# Patient Record
Sex: Female | Born: 1935
Health system: Southern US, Community
[De-identification: ages and names within clinical notes are randomized; demographics above are authoritative.]

## PROBLEM LIST (undated history)

## (undated) DIAGNOSIS — I10 Essential (primary) hypertension: Secondary | ICD-10-CM

## (undated) DIAGNOSIS — K2100 Gastro-esophageal reflux disease with esophagitis, without bleeding: Secondary | ICD-10-CM

## (undated) DIAGNOSIS — G459 Transient cerebral ischemic attack, unspecified: Secondary | ICD-10-CM

## (undated) DIAGNOSIS — H3530 Unspecified macular degeneration: Secondary | ICD-10-CM

## (undated) DIAGNOSIS — E785 Hyperlipidemia, unspecified: Secondary | ICD-10-CM

## (undated) DIAGNOSIS — M5020 Other cervical disc displacement, unspecified cervical region: Secondary | ICD-10-CM

## (undated) DIAGNOSIS — M810 Age-related osteoporosis without current pathological fracture: Secondary | ICD-10-CM

## (undated) DIAGNOSIS — E039 Hypothyroidism, unspecified: Secondary | ICD-10-CM

## (undated) DIAGNOSIS — E559 Vitamin D deficiency, unspecified: Secondary | ICD-10-CM

## (undated) DIAGNOSIS — K5730 Diverticulosis of large intestine without perforation or abscess without bleeding: Secondary | ICD-10-CM

## (undated) DIAGNOSIS — M4120 Other idiopathic scoliosis, site unspecified: Secondary | ICD-10-CM

## (undated) DIAGNOSIS — M542 Cervicalgia: Secondary | ICD-10-CM

## (undated) DIAGNOSIS — H409 Unspecified glaucoma: Secondary | ICD-10-CM

## (undated) DIAGNOSIS — M545 Low back pain, unspecified: Secondary | ICD-10-CM

## (undated) DIAGNOSIS — M1990 Unspecified osteoarthritis, unspecified site: Secondary | ICD-10-CM

## (undated) DIAGNOSIS — L989 Disorder of the skin and subcutaneous tissue, unspecified: Secondary | ICD-10-CM

## (undated) DIAGNOSIS — E079 Disorder of thyroid, unspecified: Secondary | ICD-10-CM

## (undated) DIAGNOSIS — K4090 Unilateral inguinal hernia, without obstruction or gangrene, not specified as recurrent: Secondary | ICD-10-CM

## (undated) DIAGNOSIS — R079 Chest pain, unspecified: Secondary | ICD-10-CM

## (undated) DIAGNOSIS — N393 Stress incontinence (female) (male): Secondary | ICD-10-CM

## (undated) DIAGNOSIS — K648 Other hemorrhoids: Secondary | ICD-10-CM

## (undated) DIAGNOSIS — R002 Palpitations: Secondary | ICD-10-CM

## (undated) DIAGNOSIS — M7120 Synovial cyst of popliteal space [Baker], unspecified knee: Secondary | ICD-10-CM

## (undated) DIAGNOSIS — M25549 Pain in joints of unspecified hand: Secondary | ICD-10-CM

## (undated) DIAGNOSIS — K219 Gastro-esophageal reflux disease without esophagitis: Secondary | ICD-10-CM

## (undated) HISTORY — DX: Unilateral inguinal hernia, without obstruction or gangrene, not specified as recurrent: K40.90

## (undated) HISTORY — DX: Vitamin D deficiency, unspecified: E55.9

## (undated) HISTORY — DX: Low back pain: M54.5

## (undated) HISTORY — DX: Unspecified osteoarthritis, unspecified site: M19.90

## (undated) HISTORY — DX: Diverticulosis of large intestine without perforation or abscess without bleeding: K57.30

## (undated) HISTORY — DX: Low back pain, unspecified: M54.50

## (undated) HISTORY — DX: Gastro-esophageal reflux disease with esophagitis, without bleeding: K21.00

## (undated) HISTORY — DX: Palpitations: R00.2

## (undated) HISTORY — DX: Stress incontinence (female) (male): N39.3

## (undated) HISTORY — DX: Synovial cyst of popliteal space (Baker), unspecified knee: M71.20

## (undated) HISTORY — DX: Cervicalgia: M54.2

## (undated) HISTORY — DX: Age-related osteoporosis without current pathological fracture: M81.0

## (undated) HISTORY — DX: Disorder of the skin and subcutaneous tissue, unspecified: L98.9

## (undated) HISTORY — DX: Other idiopathic scoliosis, site unspecified: M41.20

## (undated) HISTORY — DX: Other hemorrhoids: K64.8

## (undated) HISTORY — DX: Disorder of thyroid, unspecified: E07.9

## (undated) HISTORY — DX: Hyperlipidemia, unspecified: E78.5

## (undated) HISTORY — PX: NM MYOVIEW LTD: HXRAD82

## (undated) HISTORY — DX: Unspecified glaucoma: H40.9

## (undated) HISTORY — PX: OTHER SURGICAL HISTORY: SHX169

## (undated) HISTORY — DX: Unspecified macular degeneration: H35.30

## (undated) HISTORY — DX: Hypothyroidism, unspecified: E03.9

## (undated) HISTORY — PX: DOPPLER ECHOCARDIOGRAPHY: SHX263

## (undated) HISTORY — DX: Other cervical disc displacement, unspecified cervical region: M50.20

## (undated) HISTORY — PX: INGUINAL HERNIA REPAIR: SUR1180

## (undated) HISTORY — DX: Essential (primary) hypertension: I10

## (undated) HISTORY — DX: Pain in joints of unspecified hand: M25.549

## (undated) HISTORY — DX: Gastro-esophageal reflux disease with esophagitis: K21.0

---
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
## 1973-05-15 HISTORY — PX: ABDOMINAL HYSTERECTOMY: SHX81

## 1986-05-15 HISTORY — PX: APPENDECTOMY: SHX54

## 2000-04-09 ENCOUNTER — Other Ambulatory Visit: Admission: RE | Admit: 2000-04-09 | Discharge: 2000-04-09 | Payer: Self-pay | Admitting: Gynecology

## 2000-07-02 ENCOUNTER — Encounter: Payer: Self-pay | Admitting: Internal Medicine

## 2000-07-02 ENCOUNTER — Ambulatory Visit (HOSPITAL_COMMUNITY): Admission: RE | Admit: 2000-07-02 | Discharge: 2000-07-02 | Payer: Self-pay | Admitting: Internal Medicine

## 2000-07-03 ENCOUNTER — Encounter: Payer: Self-pay | Admitting: Internal Medicine

## 2000-07-09 ENCOUNTER — Encounter: Admission: RE | Admit: 2000-07-09 | Discharge: 2000-07-09 | Payer: Self-pay | Admitting: Internal Medicine

## 2000-07-09 ENCOUNTER — Encounter: Payer: Self-pay | Admitting: Internal Medicine

## 2001-09-12 ENCOUNTER — Ambulatory Visit (HOSPITAL_COMMUNITY): Admission: RE | Admit: 2001-09-12 | Discharge: 2001-09-12 | Payer: Self-pay | Admitting: Internal Medicine

## 2003-06-10 ENCOUNTER — Encounter: Admission: RE | Admit: 2003-06-10 | Discharge: 2003-06-10 | Payer: Self-pay | Admitting: Gastroenterology

## 2004-12-27 ENCOUNTER — Ambulatory Visit (HOSPITAL_COMMUNITY): Admission: RE | Admit: 2004-12-27 | Discharge: 2004-12-27 | Payer: Self-pay | Admitting: Gastroenterology

## 2006-09-24 LAB — HM COLONOSCOPY

## 2007-05-16 HISTORY — PX: EYE SURGERY: SHX253

## 2008-08-10 ENCOUNTER — Encounter: Admission: RE | Admit: 2008-08-10 | Discharge: 2008-08-10 | Payer: Self-pay | Admitting: Internal Medicine

## 2010-09-30 NOTE — Procedures (Signed)
[address]. [facility]  Patient:    [name], [name] Visit Number: [id number] MRN: [id number]          Service Type: END Location: ENDO Attending Physician:  Orland Mustard Dictated by:   Llana Aliment. Randa Evens, M.D. Proc. Date: 09/12/01 Admit Date:  09/12/2001   CC:         Lenon Curt. Cassell Clement, M.D.   Procedure Report  DATE OF BIRTH:  08/22/1928.  PROCEDURE:  Colonoscopy.  MEDICATIONS:  Fentanyl 75 mcg, Versed 5 mg IV.  SCOPE:  Olympus pediatric video colonoscope.  INDICATION:  Strong family history of colon cancer.  DESCRIPTION OF PROCEDURE:  The procedure had been explained to the patient and consent obtained.  With the patient in the left lateral decubitus position, the Olympus pediatric video scope was inserted and advanced under direct visualization.  The prep was excellent.  She had extensive diverticular disease.  After we were able to pass the sigmoid, we were able to advance rapidly to the cecum.  The terminal ileum was entered for a short distance and was normal.  The scope was withdrawn and the cecum, ascending colon, hepatic flexure, transverse colon, splenic flexure, descending, and sigmoid colon were seen well upon removal.  No polyps or other lesions were seen.  The scope was then withdrawn.  The patient tolerated the procedure well.  ASSESSMENT: 1. Marked diverticular disease. 2. No polyps in this high-risk individual.  PLAN:  Will recommend repeating in five years due to her family history. Dictated by:   Llana Aliment. Randa Evens, M.D. Attending Physician:  Orland Mustard DD:  09/12/01 TD:  09/13/01 Job: [id number]

## 2010-09-30 NOTE — Op Note (Signed)
[name], [name]                ACCOUNT NO.:  [id number]   MEDICAL RECORD NO.:  [id number]          PATIENT TYPE:  AMB   LOCATION:  ENDO                         FACILITY:  [facility]   PHYSICIAN:  James L. Malon Kindle., M.D.DATE OF BIRTH:  Dec 31, 1928   DATE OF PROCEDURE:  12/27/2004  DATE OF DISCHARGE:                                 OPERATIVE REPORT   PROCEDURE:  Esophagogastroduodenoscopy.   MEDICATIONS:  1.  Fentanyl 40 mcg.  2.  Versed 4 mg IV.   INDICATION:  Dyspepsia.   DESCRIPTION OF PROCEDURE:  Procedure explained and the patient consent  obtained.  With the patient in the left lateral decubitus position, the  Olympus scope was inserted and advanced.  The stomach was __________passed.  The duodenum including the bulb and second portion were seen well.  The  scope withdrawn back in the stomach.  The antrum and body were normal, the  fundus and cardia were seen well in the retroflex view and were normal.  The  GE junction was widely patent.  Distal esophagus was slightly red, there was  no Barrett's esophagus.  The scope was withdrawn and the patient tolerated  the procedure well.   ASSESSMENT:  Dyspepsia probably due to esophageal reflux (81191).   PLAN:  1.  Will continue to treat for reflux and give her a reflux instruction      sheet, continue the Protonix.  2.  See back in the office in 2-3 months.           ______________________________  Llana Aliment. Malon Kindle., M.D.     Waldron Session  D:  12/27/2004  T:  12/27/2004  Job:  [id number]   cc:   Lenon Curt. Chilton Si, M.D.  [address]  [place]  [place] [address]  Fax: [phone]

## 2010-12-02 LAB — HM DEXA SCAN

## 2011-06-01 DIAGNOSIS — M1710 Unilateral primary osteoarthritis, unspecified knee: Secondary | ICD-10-CM | POA: Diagnosis not present

## 2011-06-19 DIAGNOSIS — M1710 Unilateral primary osteoarthritis, unspecified knee: Secondary | ICD-10-CM | POA: Diagnosis not present

## 2011-06-19 DIAGNOSIS — I10 Essential (primary) hypertension: Secondary | ICD-10-CM | POA: Diagnosis not present

## 2011-06-19 DIAGNOSIS — M810 Age-related osteoporosis without current pathological fracture: Secondary | ICD-10-CM | POA: Diagnosis not present

## 2011-06-19 DIAGNOSIS — E039 Hypothyroidism, unspecified: Secondary | ICD-10-CM | POA: Diagnosis not present

## 2011-06-19 DIAGNOSIS — M2040 Other hammer toe(s) (acquired), unspecified foot: Secondary | ICD-10-CM | POA: Diagnosis not present

## 2011-06-19 DIAGNOSIS — E559 Vitamin D deficiency, unspecified: Secondary | ICD-10-CM | POA: Diagnosis not present

## 2011-06-19 DIAGNOSIS — M25569 Pain in unspecified knee: Secondary | ICD-10-CM | POA: Diagnosis not present

## 2011-06-21 DIAGNOSIS — E559 Vitamin D deficiency, unspecified: Secondary | ICD-10-CM | POA: Diagnosis not present

## 2011-06-21 DIAGNOSIS — L84 Corns and callosities: Secondary | ICD-10-CM | POA: Diagnosis not present

## 2011-06-21 DIAGNOSIS — E039 Hypothyroidism, unspecified: Secondary | ICD-10-CM | POA: Diagnosis not present

## 2011-06-21 DIAGNOSIS — I10 Essential (primary) hypertension: Secondary | ICD-10-CM | POA: Diagnosis not present

## 2011-07-19 DIAGNOSIS — M2040 Other hammer toe(s) (acquired), unspecified foot: Secondary | ICD-10-CM | POA: Diagnosis not present

## 2011-08-15 DIAGNOSIS — H409 Unspecified glaucoma: Secondary | ICD-10-CM | POA: Diagnosis not present

## 2011-08-15 DIAGNOSIS — H26499 Other secondary cataract, unspecified eye: Secondary | ICD-10-CM | POA: Diagnosis not present

## 2011-08-15 DIAGNOSIS — H401290 Low-tension glaucoma, unspecified eye, stage unspecified: Secondary | ICD-10-CM | POA: Diagnosis not present

## 2011-09-15 ENCOUNTER — Encounter: Payer: Self-pay | Admitting: Family Medicine

## 2011-09-15 ENCOUNTER — Ambulatory Visit (INDEPENDENT_AMBULATORY_CARE_PROVIDER_SITE_OTHER): Payer: Medicare Other | Admitting: Family Medicine

## 2011-09-15 VITALS — BP 152/79 | HR 79 | Temp 97.9°F | Resp 16 | Ht 60.4 in | Wt 123.4 lb

## 2011-09-15 DIAGNOSIS — J019 Acute sinusitis, unspecified: Secondary | ICD-10-CM

## 2011-09-15 DIAGNOSIS — R509 Fever, unspecified: Secondary | ICD-10-CM

## 2011-09-15 MED ORDER — LEVOFLOXACIN 500 MG PO TABS: 500.0000 mg | ORAL_TABLET | Freq: Every day | ORAL | Status: AC

## 2011-09-15 NOTE — Progress Notes (Signed)
This is an 78 year old woman who is a patient of Dr. Murray Hodgkins and comes in with sinus congestion, frontal headache, scratchy throat, and occasional cough. She has an associated low-grade fever of 99-1/2 the last 36 hours.  She denies ear pain, nausea, vomiting, chest pain, shortness of breath, problems with a limitation, or rash  Objective: Elderly woman in no acute distress with normal gait  HEENT: Mild erythema throat, marked mucopurulent discharge of both nasal passages, normal TMs  Neck: Supple, no adenopathy, no thyromegaly  Chest: Clear  Heart: Regular no murmur or gallop  Skin: No rashes  Assessment: Acute sinusitis with associated symptoms of low-grade fever and scratchy throat with occasional cough  Plan: Levaquin 500 daily x7, call if problems persist or worsen

## 2011-09-15 NOTE — Progress Notes (Signed)
  Subjective:    Patient ID: [name], female    DOB: [birth year], 78 y.o.   MRN: [id number]  Sore Throat  Associated symptoms include congestion, coughing and headaches.  Cough The current episode started in the past 7 days. Associated symptoms include headaches.  Headache  This is a new problem. The current episode started in the past 7 days. Associated symptoms include coughing.   Several days of progressive congestion with frontal headache, scratchy throat, and now fever for 2 days. No significant cough.   Review of Systems  Constitutional: Negative.   HENT: Positive for congestion.   Respiratory: Positive for cough.   Neurological: Positive for headaches.  Nasal cavity and oropharynx erythema.     Objective:   Physical Exam        Assessment & Plan:  Sinusitis   Levaquin 500mg  1 qd #7

## 2011-09-19 ENCOUNTER — Telehealth: Payer: Self-pay

## 2011-09-19 DIAGNOSIS — J209 Acute bronchitis, unspecified: Secondary | ICD-10-CM | POA: Diagnosis not present

## 2011-09-19 NOTE — Telephone Encounter (Signed)
Faxed records over to Dr Thomasene Lot Office per patient's request.  The records were faxed thru Epic.

## 2011-09-19 NOTE — Telephone Encounter (Signed)
.  umfc The patient called to request that her notes from her 09/15/11 office visit with Dr. Milus Glazier be faxed to Dr. Thomasene Lot office at [phone] for her appointment at 1pm today.

## 2011-11-15 DIAGNOSIS — R070 Pain in throat: Secondary | ICD-10-CM | POA: Diagnosis not present

## 2011-11-22 DIAGNOSIS — Z01810 Encounter for preprocedural cardiovascular examination: Secondary | ICD-10-CM | POA: Diagnosis not present

## 2011-11-22 DIAGNOSIS — I10 Essential (primary) hypertension: Secondary | ICD-10-CM | POA: Diagnosis not present

## 2011-11-22 DIAGNOSIS — E785 Hyperlipidemia, unspecified: Secondary | ICD-10-CM | POA: Diagnosis not present

## 2011-11-27 DIAGNOSIS — M2040 Other hammer toe(s) (acquired), unspecified foot: Secondary | ICD-10-CM | POA: Diagnosis not present

## 2011-11-30 DIAGNOSIS — M25519 Pain in unspecified shoulder: Secondary | ICD-10-CM | POA: Diagnosis not present

## 2011-11-30 DIAGNOSIS — R070 Pain in throat: Secondary | ICD-10-CM | POA: Diagnosis not present

## 2011-12-14 HISTORY — PX: FOOT SURGERY: SHX648

## 2011-12-25 DIAGNOSIS — I10 Essential (primary) hypertension: Secondary | ICD-10-CM | POA: Diagnosis not present

## 2011-12-25 DIAGNOSIS — E0590 Thyrotoxicosis, unspecified without thyrotoxic crisis or storm: Secondary | ICD-10-CM | POA: Diagnosis not present

## 2011-12-25 DIAGNOSIS — E785 Hyperlipidemia, unspecified: Secondary | ICD-10-CM | POA: Diagnosis not present

## 2011-12-27 DIAGNOSIS — E785 Hyperlipidemia, unspecified: Secondary | ICD-10-CM | POA: Diagnosis not present

## 2011-12-27 DIAGNOSIS — M810 Age-related osteoporosis without current pathological fracture: Secondary | ICD-10-CM | POA: Diagnosis not present

## 2011-12-27 DIAGNOSIS — M2040 Other hammer toe(s) (acquired), unspecified foot: Secondary | ICD-10-CM | POA: Diagnosis not present

## 2011-12-27 DIAGNOSIS — H6120 Impacted cerumen, unspecified ear: Secondary | ICD-10-CM | POA: Diagnosis not present

## 2012-01-06 ENCOUNTER — Observation Stay (HOSPITAL_COMMUNITY)
Admission: EM | Admit: 2012-01-06 | Discharge: 2012-01-07 | Disposition: A | Discharge status: Home / Self Care | Payer: Medicare Other | Attending: Emergency Medicine | Admitting: Emergency Medicine

## 2012-01-06 DIAGNOSIS — I10 Essential (primary) hypertension: Secondary | ICD-10-CM | POA: Insufficient documentation

## 2012-01-06 DIAGNOSIS — J449 Chronic obstructive pulmonary disease, unspecified: Secondary | ICD-10-CM | POA: Insufficient documentation

## 2012-01-06 DIAGNOSIS — R079 Chest pain, unspecified: Principal | ICD-10-CM | POA: Diagnosis present

## 2012-01-06 DIAGNOSIS — J4489 Other specified chronic obstructive pulmonary disease: Secondary | ICD-10-CM | POA: Insufficient documentation

## 2012-01-06 HISTORY — DX: Chest pain, unspecified: R07.9

## 2012-01-06 LAB — BASIC METABOLIC PANEL
BUN: 21 mg/dL (ref 6–23)
Chloride: 104 mEq/L (ref 96–112)
GFR calc non Af Amer: 63 mL/min — ABNORMAL LOW (ref >90)
Glucose, Bld: 99 mg/dL (ref 70–99)
Potassium: 3.7 mEq/L (ref 3.5–5.1)
Sodium: 139 mEq/L (ref 135–145)

## 2012-01-06 LAB — CBC WITH DIFFERENTIAL/PLATELET
Hemoglobin: 11.8 g/dL — ABNORMAL LOW (ref 12.0–15.0)
Lymphocytes Relative: 40 % (ref 12–46)
Lymphs Abs: 2.9 10*3/uL (ref 0.7–4.0)
Monocytes Relative: 6 % (ref 3–12)
Neutro Abs: 3.7 10*3/uL (ref 1.7–7.7)
Neutrophils Relative %: 52 % (ref 43–77)
Platelets: 187 10*3/uL (ref 150–400)
RBC: 3.88 MIL/uL (ref 3.87–5.11)
WBC: 7.3 10*3/uL (ref 4.0–10.5)

## 2012-01-06 NOTE — ED Notes (Signed)
Pt arrived via [facility] from home c/o intermittent at sharp stabbing substernal CP last for approximately a second every 10 minutes. 4 episodes experienced en route with EMS. 20 ga Lt forearm placed by EMS. 324 ASA administered prior to arrival. Pt scheduled to have surgery on left foot on Tuesday. Pt denies n/v, SOB, or back pain

## 2012-01-06 NOTE — ED Provider Notes (Signed)
History     CSN: [id number]  Arrival date & time 01/06/12  2145   First MD Initiated Contact with Patient 01/06/12 2216      Chief Complaint  Patient presents with  . Chest Pain    (Consider location/radiation/quality/duration/timing/severity/associated sxs/prior treatment) Patient is a 78 y.o. female presenting with chest pain. The history is provided by the patient.  Chest Pain The chest pain began 3 - 5 hours ago. Duration of episode(s) is 10 minutes. Chest pain occurs intermittently (3 times today). The chest pain is resolved. The pain is currently at 0/10. The quality of the pain is described as sharp (substernal). The pain does not radiate. Pertinent negatives for primary symptoms include no fever, no fatigue, no syncope, no shortness of breath, no cough, no wheezing, no palpitations, no nausea, no vomiting and no dizziness.  Pertinent negatives for associated symptoms include no diaphoresis, no lower extremity edema, no orthopnea and no paroxysmal nocturnal dyspnea. She tried nothing for the symptoms. Risk factors include being elderly.  Her past medical history is significant for hypertension.  Pertinent negatives for past medical history include no CAD, no COPD, no CHF and no diabetes.     No past medical history on file.  No past surgical history on file.  No family history on file.  History  Substance Use Topics  . Smoking status: Never Smoker   . Smokeless tobacco: Never Used  . Alcohol Use: Not on file    OB History    Grav Para Term Preterm Abortions TAB SAB Ect Mult Living                  Review of Systems  Constitutional: Negative for fever, chills, diaphoresis and fatigue.  HENT: Negative.   Eyes: Negative.   Respiratory: Negative for cough, chest tightness, shortness of breath and wheezing.   Cardiovascular: Positive for chest pain. Negative for palpitations, orthopnea, leg swelling and syncope.  Gastrointestinal: Negative.  Negative for nausea and  vomiting.  Genitourinary: Negative.   Musculoskeletal: Negative.   Skin: Negative.   Neurological: Negative.  Negative for dizziness.  All other systems reviewed and are negative.    Allergies  Latex; Erythromycin; Macrodantin; Penicillins; Septra; and Trimethoprim  Home Medications   Current Outpatient Rx  Name Route Sig Dispense Refill  . ASPIRIN 81 MG PO TBEC Oral Take 81 mg by mouth daily.     Marland Kitchen BIMATOPROST 0.01 % OP SOLN Both Eyes Place 1 drop into both eyes at bedtime.    Marland Kitchen CALCIUM CARBONATE ANTACID 500 MG PO CHEW Oral Chew 3 tablets by mouth 2 (two) times daily.    Marland Kitchen VITAMIN D3 2000 UNITS PO TABS Oral Take 2,000 Units/day by mouth 2 (two) times daily.    Marland Kitchen HYPROMELLOSE 0.3 % OP SOLN Ophthalmic Apply 1 drop to eye 3 (three) times daily.    Marland Kitchen METOPROLOL SUCCINATE ER 25 MG PO TB24 Oral Take 25 mg by mouth daily.    . ICAPS PO Oral Take 1 tablet by mouth 2 (two) times daily.    Marland Kitchen OMEPRAZOLE 20 MG PO CPDR Oral Take 20 mg by mouth daily.      BP 129/87  Pulse 61  Temp 98.2 F (36.8 C) (Oral)  Resp 14  SpO2 97%  Physical Exam  Nursing note and vitals reviewed. Constitutional: She is oriented to person, place, and time. She appears well-developed and well-nourished. No distress.  HENT:  Head: Normocephalic and atraumatic.  Eyes: Conjunctivae are normal.  Neck: Neck supple.  Cardiovascular: Normal rate, regular rhythm, normal heart sounds and intact distal pulses.  Exam reveals no friction rub.   No murmur heard. Pulmonary/Chest: Effort normal and breath sounds normal. She has no wheezes. She has no rales.  Abdominal: Soft. She exhibits no distension. There is no tenderness.  Musculoskeletal: Normal range of motion. She exhibits no edema and no tenderness.  Neurological: She is alert and oriented to person, place, and time.  Skin: Skin is warm and dry.    ED Course  Procedures (including critical care time)  Labs Reviewed  CBC WITH DIFFERENTIAL - Abnormal; Notable  for the following:    Hemoglobin 11.8 (*)     HCT 35.5 (*)     All other components within normal limits  BASIC METABOLIC PANEL - Abnormal; Notable for the following:    GFR calc non Af Amer 63 (*)     GFR calc Af Amer 74 (*)     All other components within normal limits  POCT I-STAT TROPONIN I   Dg Chest 2 View  01/07/2012  *RADIOLOGY REPORT*  Clinical Data: Chest pain  CHEST - 2 VIEW  Comparison: None.  Findings: Hyperinflation with interstitial coarsening.  Apical scarring bilaterally. 1.3 cm nodular density projecting over the left upper lobe.  No focal consolidation otherwise.  No pleural effusion or pneumothorax.  Cardiomediastinal contours are within normal limits.  Mild rightward curvature of the thoracolumbar spine.  Osteopenia.  No acute osseous finding.  IMPRESSION: COPD changes with hyperinflation and interstitial coarsening.  Apical scarring. 1.3 cm nodular density projecting over the left upper lobe may also reflect scarring though a nodule is not excluded. Recommend further evaluate with a nonemergent chest CT follow-up.  Otherwise, no focal consolidation.   Original Report Authenticated By: Waneta Martins, M.D.      1. Chest pain    MDM  78 yo female with PMHx of HTN who presents for 3 episodes of sharp substernal chest pain that occurred this evening prior to arrival.  Episodes consisted of sharp pains lasting a few seconds and occurring for 10-15 minutes at a time.  Pain free at time of exam.  No shortness of breath, diaphoresis, nausea, vomiting, orthopnea, PND.  No pleuritic component to the chest pain.  Description atypical, but given age and history of HTN, will get labs including cardiac enzymes and CXR.  CBC and BMP wnl.  Troponin negative.  CXR showed apical scaring and 1.3 cm nodular density over left upper lobe.  Will need follow-up CT as outpatient.  Will place in CDU for low-risk chest pain obs for serial enzymes and cardiac CT tomorrow  morning.        Cherre Robins, MD 01/07/12 [phone]

## 2012-01-07 ENCOUNTER — Emergency Department (HOSPITAL_COMMUNITY): Payer: Medicare Other

## 2012-01-07 ENCOUNTER — Encounter (HOSPITAL_COMMUNITY): Payer: Self-pay | Admitting: Cardiology

## 2012-01-07 ENCOUNTER — Observation Stay (HOSPITAL_COMMUNITY): Payer: Medicare Other

## 2012-01-07 DIAGNOSIS — R079 Chest pain, unspecified: Secondary | ICD-10-CM | POA: Diagnosis not present

## 2012-01-07 DIAGNOSIS — J449 Chronic obstructive pulmonary disease, unspecified: Secondary | ICD-10-CM | POA: Diagnosis not present

## 2012-01-07 DIAGNOSIS — J984 Other disorders of lung: Secondary | ICD-10-CM | POA: Diagnosis not present

## 2012-01-07 HISTORY — DX: Chest pain, unspecified: R07.9

## 2012-01-07 LAB — CARDIAC PANEL(CRET KIN+CKTOT+MB+TROPI)
CK, MB: 3.3 ng/mL (ref 0.3–4.0)
Relative Index: INVALID (ref 0.0–2.5)
Total CK: 72 U/L (ref 7–177)

## 2012-01-07 LAB — POCT I-STAT TROPONIN I
Troponin i, poc: 0 ng/mL (ref 0.00–0.08)
Troponin i, poc: 0 ng/mL (ref 0.00–0.08)

## 2012-01-07 LAB — MAGNESIUM: Magnesium: 1.9 mg/dL (ref 1.5–2.5)

## 2012-01-07 MED ORDER — TECHNETIUM TC 99M TETROFOSMIN IV KIT
10.0000 | PACK | Freq: Once | INTRAVENOUS | Status: AC | PRN
  Administered 2012-01-07: 10 via INTRAVENOUS

## 2012-01-07 MED ORDER — METOPROLOL TARTRATE 25 MG PO TABS
50.0000 mg | ORAL_TABLET | Freq: Once | ORAL | Status: AC
  Administered 2012-01-07: 50 mg via ORAL
  Filled 2012-01-07: qty 2

## 2012-01-07 MED ORDER — REGADENOSON 0.4 MG/5ML IV SOLN
0.4000 mg | Freq: Once | INTRAVENOUS | Status: AC
  Administered 2012-01-07: 0.4 mg via INTRAVENOUS

## 2012-01-07 MED ORDER — TECHNETIUM TC 99M TETROFOSMIN IV KIT
30.0000 | PACK | Freq: Once | INTRAVENOUS | Status: AC | PRN
  Administered 2012-01-07: 30 via INTRAVENOUS

## 2012-01-07 MED ORDER — REGADENOSON 0.4 MG/5ML IV SOLN
INTRAVENOUS | Status: AC
  Filled 2012-01-07: qty 5

## 2012-01-07 MED ORDER — METOPROLOL SUCCINATE ER 25 MG PO TB24
25.0000 mg | ORAL_TABLET | Freq: Every day | ORAL | Status: DC
  Filled 2012-01-07: qty 1

## 2012-01-07 MED ORDER — PANTOPRAZOLE SODIUM 40 MG PO TBEC: 40.0000 mg | DELAYED_RELEASE_TABLET | Freq: Every day | ORAL | Status: DC

## 2012-01-07 NOTE — ED Notes (Signed)
Pt. oob to the bathroom, gait steady, pt. Denies any pain or discomfort.   Daughter with pt.

## 2012-01-07 NOTE — ED Provider Notes (Signed)
2:43 PM BP 124/67  Pulse 59  Temp 98.1 F (36.7 C) (Oral)  Resp 14  Ht 5' 2.99" (1.6 m)  Wt 123 lb 4.8 oz (55.929 kg)  BMI 21.85 kg/m2  SpO2 96% Patient seen and evaluated. She is in the CDU awaiting a visit from her cardiologist Dr. Herbie Baltimore. She came in today with chief complaint of 3-5 days of intermittent chest pain. No chest pain today. EKG and troponins were negative. Abnormal findings included apical scarring of the lungs and a 1.3 cm nodular density in the left upper lobe. This will need followup TTS outpatient. Cardiology put in an order for an unclear medicine myocardial multi view. It was negative for any pharmacologic stress-induced ischemia. Showed a left ventricular ejection fraction of 85%.  The patient is resting comfortably  in her room. She has no complaints at this time.  CV: RRR, No M/R/G, Peripheral pulses intact. No peripheral edema. Lungs: CTAB Abd: Soft, Non tender, non distended  4:02 PM given report to Smitty Cords is assumed care of the patient.  Arthor Captain, PA-C 01/07/12 1603

## 2012-01-07 NOTE — ED Provider Notes (Signed)
History     CSN: [id number]  Arrival date & time 01/06/12  2145   First MD Initiated Contact with Patient 01/06/12 2216      Chief Complaint  Patient presents with  . Chest Pain    (Consider location/radiation/quality/duration/timing/severity/associated sxs/prior treatment) HPI  Past Medical History  Diagnosis Date  . Chest pain at rest 01/07/2012    History reviewed. No pertinent past surgical history.  History reviewed. No pertinent family history.  History  Substance Use Topics  . Smoking status: Never Smoker   . Smokeless tobacco: Never Used  . Alcohol Use: Not on file    OB History    Grav Para Term Preterm Abortions TAB SAB Ect Mult Living                  Review of Systems  Allergies  Latex; Erythromycin; Macrodantin; Penicillins; Septra; and Trimethoprim  Home Medications   Current Outpatient Rx  Name Route Sig Dispense Refill  . ASPIRIN 81 MG PO TBEC Oral Take 81 mg by mouth daily.     Marland Kitchen BIMATOPROST 0.01 % OP SOLN Both Eyes Place 1 drop into both eyes at bedtime.    Marland Kitchen CALCIUM CARBONATE ANTACID 500 MG PO CHEW Oral Chew 3 tablets by mouth 2 (two) times daily.    Marland Kitchen VITAMIN D3 2000 UNITS PO TABS Oral Take 2,000 Units/day by mouth 2 (two) times daily.    Marland Kitchen HYPROMELLOSE 0.3 % OP SOLN Ophthalmic Apply 1 drop to eye 3 (three) times daily.    Marland Kitchen METOPROLOL SUCCINATE ER 25 MG PO TB24 Oral Take 25 mg by mouth daily.    . ICAPS PO Oral Take 1 tablet by mouth 2 (two) times daily.    Marland Kitchen OMEPRAZOLE 20 MG PO CPDR Oral Take 20 mg by mouth daily.      BP 124/67  Pulse 59  Temp 98.1 F (36.7 C) (Oral)  Resp 14  Ht 5' 2.99" (1.6 m)  Wt 123 lb 4.8 oz (55.929 kg)  BMI 21.85 kg/m2  SpO2 96%  Physical Exam  ED Course  Procedures (including critical care time)  Labs Reviewed  CBC WITH DIFFERENTIAL - Abnormal; Notable for the following:    Hemoglobin 11.8 (*)     HCT 35.5 (*)     All other components within normal limits  BASIC METABOLIC PANEL - Abnormal;  Notable for the following:    GFR calc non Af Amer 63 (*)     GFR calc Af Amer 74 (*)     All other components within normal limits  POCT I-STAT TROPONIN I  POCT I-STAT TROPONIN I  POCT I-STAT TROPONIN I  CARDIAC PANEL(CRET KIN+CKTOT+MB+TROPI)  MAGNESIUM  TSH   Dg Chest 2 View  01/07/2012  *RADIOLOGY REPORT*  Clinical Data: Chest pain  CHEST - 2 VIEW  Comparison: None.  Findings: Hyperinflation with interstitial coarsening.  Apical scarring bilaterally. 1.3 cm nodular density projecting over the left upper lobe.  No focal consolidation otherwise.  No pleural effusion or pneumothorax.  Cardiomediastinal contours are within normal limits.  Mild rightward curvature of the thoracolumbar spine.  Osteopenia.  No acute osseous finding.  IMPRESSION: COPD changes with hyperinflation and interstitial coarsening.  Apical scarring. 1.3 cm nodular density projecting over the left upper lobe may also reflect scarring though a nodule is not excluded. Recommend further evaluate with a nonemergent chest CT follow-up.  Otherwise, no focal consolidation.   Original Report Authenticated By: Waneta Martins, M.D.  Nm Myocar Multi W/spect W/wall Motion / Ef  01/07/2012  *RADIOLOGY REPORT*  Clinical data: Chest pain, preop clearance  NUCLEAR MEDICINE MYOCARDIAL PERFUSION IMAGING NUCLEAR MEDICINE LEFT VENTRICULAR WALL MOTION ANALYSIS NUCLEAR MEDICINE LEFT VENTRICULAR EJECTION FRACTION CALCULATION  Technique: Standard single day myocardial SPECT imaging was performed after resting intravenous injection of Tc-45m Myoview. After intravenous infusion of Lexiscan (regadenoson) under supervision of cardiology staff, Myoview was injected intravenously and standard myocardial SPECT imaging was performed. Quantitative gated imaging was also performed to evaluate left ventricular wall motion and estimate left ventricular ejection fraction.  Radiopharmaceutical: 11+33 mCi Tc27m Myoview IV.  Comparison: None.  Findings:  The stress  SPECT images demonstrate physiologic distribution of radiopharmaceutical.  Rest images demonstrate no perfusion defects.  The gated stress SPECT images demonstrate normal left ventricular myocardial thickening.  No focal wall motion abnormality is seen.  Calculated left ventricular end-diastolic volume 49ml, end-systolic volume 8ml, ejection fraction of 85%.  IMPRESSION:  1. Negative for pharmacologic-stress induced ischemia.  2. Left ventricular ejection fraction 85%.   Original Report Authenticated By: Charline Bills, M.D.      1. Chest pain     4:05 PM Handoff from [facility]. Awaiting cardiology consult from Dr. Herbie Baltimore. Anticipate d/c to home.   Patient seen and examined. CP free. States she had HA and muscle aches after stress echo however these symptoms are improving and she is feeling better.   Exam:  Gen NAD; Heart RRR, nml S1,S2, no m/r/g; Lungs CTAB; Abd soft, NT, no rebound or guarding; Ext 2+ pedal pulses bilaterally, no edema.  Vital signs reviewed and are as follows: Filed Vitals:   01/07/12 1300  BP: 124/67  Pulse: 59  Temp:   Resp: 14   4:23 PM Dr. Herbie Baltimore has seen patient. She is ready for discharge. She is to follow-up with Dr. Allyson Sabal in office.   Patient was counseled to return with severe chest pain, especially if the pain is crushing or pressure-like and spreads to the arms, back, neck, or jaw, or if they have sweating, nausea, or shortness of breath with the pain. They were encouraged to call 911 with these symptoms.   They were also told to return if their chest pain gets worse and does not go away with rest, they have an attack of chest pain lasting longer than usual despite rest and treatment with the medications their caregiver has prescribed, if they wake from sleep with chest pain or shortness of breath, if they feel dizzy or faint, if they have chest pain not typical of their usual pain, or if they have any other emergent concerns regarding their  health.  The patient verbalized understanding and agreed.      MDM  CP -- stress testing performed. Seen by cards. OK for discharge. CP free in CDU.         Renne Crigler, [place] 01/07/12 1624

## 2012-01-07 NOTE — Consult Note (Signed)
Reason for Consult:chest pain with need for foot surgery next week   Referring Physician: ER MD   [name] is an 78 y.o. female.    Chief Complaint: chest pain   HPI: 78 yo female with PMHx of HTN who presents for 3 episodes of sharp substernal chest pain that occurred this evening prior to arrival. Episodes consisted of sharp pains lasting a few seconds and occurring for 10-15 minutes at a time. Pain free at time of exam. No shortness of breath, diaphoresis, nausea, vomiting, orthopnea, PND. No pleuritic component to the chest pain. Description atypical, but given age and history of HTN and need for surgery will proceed with [name].   She has of history of reflux disease but this pain was different than with her reflux.  Sharp shooting pains that would come and go.  No assoc. SOB, nausea or diaphoresis.  She has not seen Dr. Allyson Sabal in over a year.  She believes she has had a stress test in the past and an echo.  But no awareness of CAD.  Cardiac markers have been negative.  Due to pt's need for surgery this week will proceed with Lexiscan myoview to rule out ischemia related chest pain.  Past Medical History  Diagnosis Date  . Chest pain at rest 01/07/2012    History reviewed. No pertinent past surgical history.  History reviewed. No pertinent family history. Social History:  reports that she has never smoked. She has never used smokeless tobacco. Her alcohol and drug histories not on file.  Allergies:  Allergies  Allergen Reactions  . Latex   . Erythromycin   . Macrodantin (Nitrofurantoin Macrocrystal)   . Penicillins   . Septra (Sulfamethoxazole-Tmp Ds)   . Trimethoprim     Home Medications:  ASA 81 mg daily Lumigan eye drops Calcium tums Vit D3 Hypromellose 0.3% sol eye drops I caps daily toprol xl 25 mg daily priolsec 20 mg daily   Results for orders placed during the hospital encounter of 01/06/12 (from the past 48 hour(s))  CBC WITH  DIFFERENTIAL     Status: Abnormal   Collection Time   01/06/12 11:18 PM      Component Value Range Comment   WBC 7.3  4.0 - 10.5 K/uL    RBC 3.88  3.87 - 5.11 MIL/uL    Hemoglobin 11.8 (*) 12.0 - 15.0 g/dL    HCT 45.4 (*) 09.8 - 46.0 %    MCV 91.5  78.0 - 100.0 fL    MCH 30.4  26.0 - 34.0 pg    MCHC 33.2  30.0 - 36.0 g/dL    RDW 11.9  14.7 - 82.9 %    Platelets 187  150 - 400 K/uL    Neutrophils Relative 52  43 - 77 %    Neutro Abs 3.7  1.7 - 7.7 K/uL    Lymphocytes Relative 40  12 - 46 %    Lymphs Abs 2.9  0.7 - 4.0 K/uL    Monocytes Relative 6  3 - 12 %    Monocytes Absolute 0.5  0.1 - 1.0 K/uL    Eosinophils Relative 2  0 - 5 %    Eosinophils Absolute 0.1  0.0 - 0.7 K/uL    Basophils Relative 0  0 - 1 %    Basophils Absolute 0.0  0.0 - 0.1 K/uL   BASIC METABOLIC PANEL     Status: Abnormal   Collection Time   01/06/12 11:18 PM  Component Value Range Comment   Sodium 139  135 - 145 mEq/L    Potassium 3.7  3.5 - 5.1 mEq/L    Chloride 104  96 - 112 mEq/L    CO2 26  19 - 32 mEq/L    Glucose, Bld 99  70 - 99 mg/dL    BUN 21  6 - 23 mg/dL    Creatinine, Ser 3.08  0.50 - 1.10 mg/dL    Calcium 9.6  8.4 - 65.7 mg/dL    GFR calc non Af Amer 63 (*) >90 mL/min    GFR calc Af Amer 74 (*) >90 mL/min   POCT I-STAT TROPONIN I     Status: Normal   Collection Time   01/07/12 12:41 AM      Component Value Range Comment   Troponin i, poc 0.00  0.00 - 0.08 ng/mL    Comment 3            POCT I-STAT TROPONIN I     Status: Normal   Collection Time   01/07/12  6:47 AM      Component Value Range Comment   Troponin i, poc 0.00  0.00 - 0.08 ng/mL    Comment 3            POCT I-STAT TROPONIN I     Status: Normal   Collection Time   01/07/12  8:27 AM      Component Value Range Comment   Troponin i, poc 0.00  0.00 - 0.08 ng/mL    Comment 3             Dg Chest 2 View  01/07/2012  *RADIOLOGY REPORT*  Clinical Data: Chest pain  CHEST - 2 VIEW  Comparison: None.  Findings: Hyperinflation with  interstitial coarsening.  Apical scarring bilaterally. 1.3 cm nodular density projecting over the left upper lobe.  No focal consolidation otherwise.  No pleural effusion or pneumothorax.  Cardiomediastinal contours are within normal limits.  Mild rightward curvature of the thoracolumbar spine.  Osteopenia.  No acute osseous finding.  IMPRESSION: COPD changes with hyperinflation and interstitial coarsening.  Apical scarring. 1.3 cm nodular density projecting over the left upper lobe may also reflect scarring though a nodule is not excluded. Recommend further evaluate with a nonemergent chest CT follow-up.  Otherwise, no focal consolidation.   Original Report Authenticated By: Waneta Martins, M.D.     ROS: General:No recent colds or fevers Skin:No rashes or ulcers HEENT:No blurred vision no double vision QI:ONGEX pain as described. PUL:No shortness of breath BM:WUXLKGM of reflux disease the pain today felt different than her reflux denies diarrhea constipation or melena GU:No hematuria or dysuria MS:She has a hammertoe on her left foot that she is having surgery on on Tuesday, August 27 Neuro:No syncope no lightheadedness no dizziness Endo:For diabetes or thyroid disease   Blood pressure 141/54, pulse 51, temperature 98.1 F (36.7 C), temperature source Oral, resp. rate 15, height 5' 2.99" (1.6 m), weight 55.929 kg (123 lb 4.8 oz), SpO2 98.00%. PE: General:Alert oriented white female in no acute distress pleasant affect Skin:Skin warm and dry brisk. HEENT:Normocephalic sclera clear Neck:Supple no JVD no bruits Heart:S1-S2 regular rate and rhythm without murmur gallop or click Lungs:Clear without rales rhonchi or wheezes WNU:UVOZ nontender positive bowel sounds do not palpate liver spleen or masses Ext:No edema 2+ pedal pulses bilaterally positive varicosities of lower extremities hammertoe on the left foot Neuro:Alert and oriented x3 follows commands moves all extremities  Assessment/Plan Active Problems:  Chest pain at rest  PLAN: [name] Was ordered and is now complete she tolerated the procedure with minimal chest discomfort and stress throughout her neck she also had a mild headache that was resolving in recovery.  Her EKG today has had inverted T waves in aVR, During the study she had mild ST depression in her inferior leads.  Nuc study results to follow. They are negative for ischemia patient could proceed with surgery on Tuesday. If they're positive for ischemia she will need to be admitted for cardiac catheterization.    Dr. Herbie Baltimore to see as well.  INGOLD,LAURA R 01/07/2012, 8:57 AM  ATTENDING ATTESTATION:  I have seen and examined the patient along with Nada Boozer, NP.  I have reviewed the chart, notes and new data.  I agree with Laura's findings, examination & recommendations as noted above.  Brief Description: Very pleasant 78 y/o woman with no prior cardiac history (seen Dr. Allyson Sabal in the past for cardiac risk factor Rx) presented with SSx that are quite atypical for angina -- sharp, brief (lasting seconds)  & not associated with exertion. As she does have RFs for CAD, with he planned surgery this week, we determined that the most expeditious way to evaluate her CP and provide pre-op risk assessment was to proceed with a Lexiscan Myoview.   Myoview results have been reviewed. No evidence of ischemia or prior infarction.  Preserved EF.  Atypical symptoms that are most likely not Anginal in nature, now with "Normal" Myoview -- she is low risk for a low risk surgery. She is fine for d/c home with ROV to see Dr. Allyson Sabal @ [facility] post operatively.   Marykay Lex, M.D., M.S. [facility] [address] [place], [place]  [address]  [phone]  01/07/2012 4:02 PM

## 2012-01-08 NOTE — ED Provider Notes (Signed)
Medical screening examination/treatment/procedure(s) were performed by non-physician practitioner and as supervising physician I was immediately available for consultation/collaboration.   Ramal Eckhardt E Goodwin Kamphaus, MD 01/08/12 0740 

## 2012-01-09 DIAGNOSIS — M2040 Other hammer toe(s) (acquired), unspecified foot: Secondary | ICD-10-CM | POA: Diagnosis not present

## 2012-01-10 NOTE — ED Provider Notes (Signed)
I saw and evaluated the patient, reviewed the resident's note and I agree with the findings and plan. Jones Skene, M.D.   Jones Skene, MD 01/10/12 1208

## 2012-01-11 NOTE — ED Provider Notes (Signed)
Medical screening examination/treatment/procedure(s) were performed by non-physician practitioner and as supervising physician I was immediately available for consultation/collaboration.  Hurman Horn, MD 01/11/12 1137

## 2012-01-22 NOTE — Progress Notes (Signed)
Observation review is complete for the 01/06/2012 visit. 

## 2012-02-05 DIAGNOSIS — M2040 Other hammer toe(s) (acquired), unspecified foot: Secondary | ICD-10-CM | POA: Diagnosis not present

## 2012-02-12 ENCOUNTER — Other Ambulatory Visit: Payer: Self-pay | Admitting: Internal Medicine

## 2012-02-12 DIAGNOSIS — E039 Hypothyroidism, unspecified: Secondary | ICD-10-CM | POA: Diagnosis not present

## 2012-02-12 DIAGNOSIS — H409 Unspecified glaucoma: Secondary | ICD-10-CM | POA: Diagnosis not present

## 2012-02-12 DIAGNOSIS — R911 Solitary pulmonary nodule: Secondary | ICD-10-CM

## 2012-02-12 DIAGNOSIS — R112 Nausea with vomiting, unspecified: Secondary | ICD-10-CM | POA: Diagnosis not present

## 2012-02-12 DIAGNOSIS — I10 Essential (primary) hypertension: Secondary | ICD-10-CM | POA: Diagnosis not present

## 2012-02-12 DIAGNOSIS — M2040 Other hammer toe(s) (acquired), unspecified foot: Secondary | ICD-10-CM | POA: Diagnosis not present

## 2012-02-15 ENCOUNTER — Other Ambulatory Visit: Payer: Medicare Other

## 2012-02-16 ENCOUNTER — Ambulatory Visit
Admission: RE | Admit: 2012-02-16 | Discharge: 2012-02-16 | Disposition: A | Discharge status: Home / Self Care | Payer: Medicare Other | Source: Ambulatory Visit | Attending: Internal Medicine | Admitting: Internal Medicine

## 2012-02-16 DIAGNOSIS — R911 Solitary pulmonary nodule: Secondary | ICD-10-CM | POA: Diagnosis not present

## 2012-02-16 MED ORDER — IOHEXOL 300 MG/ML  SOLN
75.0000 mL | Freq: Once | INTRAMUSCULAR | Status: AC | PRN
  Administered 2012-02-16: 75 mL via INTRAVENOUS

## 2012-02-20 DIAGNOSIS — E039 Hypothyroidism, unspecified: Secondary | ICD-10-CM | POA: Diagnosis not present

## 2012-02-20 DIAGNOSIS — I10 Essential (primary) hypertension: Secondary | ICD-10-CM | POA: Diagnosis not present

## 2012-02-20 DIAGNOSIS — E559 Vitamin D deficiency, unspecified: Secondary | ICD-10-CM | POA: Diagnosis not present

## 2012-02-20 DIAGNOSIS — E785 Hyperlipidemia, unspecified: Secondary | ICD-10-CM | POA: Diagnosis not present

## 2012-02-27 DIAGNOSIS — H401290 Low-tension glaucoma, unspecified eye, stage unspecified: Secondary | ICD-10-CM | POA: Diagnosis not present

## 2012-02-27 DIAGNOSIS — H409 Unspecified glaucoma: Secondary | ICD-10-CM | POA: Diagnosis not present

## 2012-02-27 DIAGNOSIS — H26499 Other secondary cataract, unspecified eye: Secondary | ICD-10-CM | POA: Diagnosis not present

## 2012-03-04 DIAGNOSIS — K4090 Unilateral inguinal hernia, without obstruction or gangrene, not specified as recurrent: Secondary | ICD-10-CM | POA: Diagnosis not present

## 2012-03-04 DIAGNOSIS — Z800 Family history of malignant neoplasm of digestive organs: Secondary | ICD-10-CM | POA: Diagnosis not present

## 2012-03-04 DIAGNOSIS — K219 Gastro-esophageal reflux disease without esophagitis: Secondary | ICD-10-CM | POA: Diagnosis not present

## 2012-03-18 DIAGNOSIS — E039 Hypothyroidism, unspecified: Secondary | ICD-10-CM | POA: Diagnosis not present

## 2012-03-20 DIAGNOSIS — M25569 Pain in unspecified knee: Secondary | ICD-10-CM | POA: Diagnosis not present

## 2012-03-20 DIAGNOSIS — M2040 Other hammer toe(s) (acquired), unspecified foot: Secondary | ICD-10-CM | POA: Diagnosis not present

## 2012-03-22 DIAGNOSIS — R002 Palpitations: Secondary | ICD-10-CM | POA: Diagnosis not present

## 2012-03-25 DIAGNOSIS — Z1231 Encounter for screening mammogram for malignant neoplasm of breast: Secondary | ICD-10-CM | POA: Diagnosis not present

## 2012-03-26 DIAGNOSIS — D2360 Other benign neoplasm of skin of unspecified upper limb, including shoulder: Secondary | ICD-10-CM | POA: Diagnosis not present

## 2012-03-26 DIAGNOSIS — D235 Other benign neoplasm of skin of trunk: Secondary | ICD-10-CM | POA: Diagnosis not present

## 2012-03-26 DIAGNOSIS — L821 Other seborrheic keratosis: Secondary | ICD-10-CM | POA: Diagnosis not present

## 2012-03-26 DIAGNOSIS — D485 Neoplasm of uncertain behavior of skin: Secondary | ICD-10-CM | POA: Diagnosis not present

## 2012-03-28 DIAGNOSIS — M25569 Pain in unspecified knee: Secondary | ICD-10-CM | POA: Diagnosis not present

## 2012-04-02 DIAGNOSIS — M25569 Pain in unspecified knee: Secondary | ICD-10-CM | POA: Diagnosis not present

## 2012-04-05 DIAGNOSIS — M25569 Pain in unspecified knee: Secondary | ICD-10-CM | POA: Diagnosis not present

## 2012-04-08 DIAGNOSIS — M25569 Pain in unspecified knee: Secondary | ICD-10-CM | POA: Diagnosis not present

## 2012-04-16 DIAGNOSIS — M25569 Pain in unspecified knee: Secondary | ICD-10-CM | POA: Diagnosis not present

## 2012-04-18 DIAGNOSIS — M25569 Pain in unspecified knee: Secondary | ICD-10-CM | POA: Diagnosis not present

## 2012-04-19 DIAGNOSIS — S60229A Contusion of unspecified hand, initial encounter: Secondary | ICD-10-CM | POA: Diagnosis not present

## 2012-04-22 DIAGNOSIS — Z800 Family history of malignant neoplasm of digestive organs: Secondary | ICD-10-CM | POA: Diagnosis not present

## 2012-04-22 DIAGNOSIS — Z1211 Encounter for screening for malignant neoplasm of colon: Secondary | ICD-10-CM | POA: Diagnosis not present

## 2012-05-01 DIAGNOSIS — M25569 Pain in unspecified knee: Secondary | ICD-10-CM | POA: Diagnosis not present

## 2012-06-21 DIAGNOSIS — E785 Hyperlipidemia, unspecified: Secondary | ICD-10-CM | POA: Diagnosis not present

## 2012-06-21 DIAGNOSIS — I10 Essential (primary) hypertension: Secondary | ICD-10-CM | POA: Diagnosis not present

## 2012-06-21 DIAGNOSIS — E039 Hypothyroidism, unspecified: Secondary | ICD-10-CM | POA: Diagnosis not present

## 2012-06-21 DIAGNOSIS — E559 Vitamin D deficiency, unspecified: Secondary | ICD-10-CM | POA: Diagnosis not present

## 2012-06-25 DIAGNOSIS — J019 Acute sinusitis, unspecified: Secondary | ICD-10-CM | POA: Diagnosis not present

## 2012-06-25 DIAGNOSIS — E0590 Thyrotoxicosis, unspecified without thyrotoxic crisis or storm: Secondary | ICD-10-CM | POA: Diagnosis not present

## 2012-06-25 DIAGNOSIS — M25569 Pain in unspecified knee: Secondary | ICD-10-CM | POA: Diagnosis not present

## 2012-06-25 DIAGNOSIS — K4090 Unilateral inguinal hernia, without obstruction or gangrene, not specified as recurrent: Secondary | ICD-10-CM | POA: Diagnosis not present

## 2012-06-25 DIAGNOSIS — M2040 Other hammer toe(s) (acquired), unspecified foot: Secondary | ICD-10-CM | POA: Diagnosis not present

## 2012-09-02 DIAGNOSIS — H26499 Other secondary cataract, unspecified eye: Secondary | ICD-10-CM | POA: Diagnosis not present

## 2012-09-02 DIAGNOSIS — H401290 Low-tension glaucoma, unspecified eye, stage unspecified: Secondary | ICD-10-CM | POA: Diagnosis not present

## 2012-10-24 ENCOUNTER — Other Ambulatory Visit: Payer: Self-pay | Admitting: *Deleted

## 2012-10-24 MED ORDER — RALOXIFENE HCL 60 MG PO TABS: 60.0000 mg | ORAL_TABLET | Freq: Every day | ORAL | Status: DC

## 2012-11-20 DIAGNOSIS — M79609 Pain in unspecified limb: Secondary | ICD-10-CM | POA: Diagnosis not present

## 2012-12-20 ENCOUNTER — Other Ambulatory Visit: Payer: Medicare Other

## 2012-12-20 ENCOUNTER — Other Ambulatory Visit: Payer: Self-pay | Admitting: *Deleted

## 2012-12-20 DIAGNOSIS — I10 Essential (primary) hypertension: Secondary | ICD-10-CM

## 2012-12-20 DIAGNOSIS — E785 Hyperlipidemia, unspecified: Secondary | ICD-10-CM

## 2012-12-20 DIAGNOSIS — E039 Hypothyroidism, unspecified: Secondary | ICD-10-CM | POA: Diagnosis not present

## 2012-12-23 ENCOUNTER — Encounter: Payer: Self-pay | Admitting: *Deleted

## 2012-12-24 ENCOUNTER — Ambulatory Visit (INDEPENDENT_AMBULATORY_CARE_PROVIDER_SITE_OTHER): Payer: Medicare Other | Admitting: Internal Medicine

## 2012-12-24 ENCOUNTER — Encounter: Payer: Self-pay | Admitting: Internal Medicine

## 2012-12-24 VITALS — BP 122/80 | HR 62 | Temp 97.4°F | Resp 16 | Ht 62.01 in | Wt 124.8 lb

## 2012-12-24 DIAGNOSIS — M545 Low back pain: Secondary | ICD-10-CM

## 2012-12-24 DIAGNOSIS — M810 Age-related osteoporosis without current pathological fracture: Secondary | ICD-10-CM | POA: Diagnosis not present

## 2012-12-24 DIAGNOSIS — H409 Unspecified glaucoma: Secondary | ICD-10-CM | POA: Insufficient documentation

## 2012-12-24 DIAGNOSIS — I10 Essential (primary) hypertension: Secondary | ICD-10-CM | POA: Insufficient documentation

## 2012-12-24 DIAGNOSIS — H3530 Unspecified macular degeneration: Secondary | ICD-10-CM

## 2012-12-24 DIAGNOSIS — E559 Vitamin D deficiency, unspecified: Secondary | ICD-10-CM | POA: Diagnosis not present

## 2012-12-24 DIAGNOSIS — E039 Hypothyroidism, unspecified: Secondary | ICD-10-CM | POA: Diagnosis not present

## 2012-12-24 DIAGNOSIS — M8580 Other specified disorders of bone density and structure, unspecified site: Secondary | ICD-10-CM | POA: Insufficient documentation

## 2012-12-24 DIAGNOSIS — E785 Hyperlipidemia, unspecified: Secondary | ICD-10-CM | POA: Insufficient documentation

## 2012-12-24 LAB — COMPREHENSIVE METABOLIC PANEL
ALT: 11 IU/L (ref 0–32)
AST: 19 IU/L (ref 0–40)
Alkaline Phosphatase: 48 IU/L (ref 39–117)
BUN/Creatinine Ratio: 25 (ref 11–26)
BUN: 21 mg/dL (ref 8–27)
CO2: 25 mmol/L (ref 18–29)
Chloride: 101 mmol/L (ref 97–108)
Sodium: 139 mmol/L (ref 134–144)

## 2012-12-24 LAB — LIPID PANEL
Cholesterol, Total: 199 mg/dL (ref 100–199)
Triglycerides: 65 mg/dL (ref 0–149)

## 2012-12-24 MED ORDER — METOPROLOL SUCCINATE ER 25 MG PO TB24: ORAL_TABLET | ORAL | Status: DC

## 2012-12-24 NOTE — Patient Instructions (Signed)
Continue current medications. 

## 2012-12-24 NOTE — Progress Notes (Signed)
Date: 12/24/2012  MRN:  [id number] Name:  [name] Sex:  female Age:  79 y.o. DOB:08-24-1928   Emergency Contacts: Contact Information   Name Relation Home Work Mobile   [name], [name] Daughter [phone]        Code Status: LIVING WILL  Allergies: Allergies  Allergen Reactions  . Latex   . Erythromycin   . Macrodantin (Nitrofurantoin Macrocrystal)   . Penicillins   . Septra (Sulfamethoxazole-Tmp Ds)   . Sulfa Antibiotics   . Trimethoprim      Chief Complaint  Patient presents with  . Annual Exam     HPI: Presents for complete exam and review of her medical problems.  Has seen Dr. Chapman Fitch, Ophth, at [facility]. Confirmed astigmatism and glaucoma that is under control.  Pain in the right foot. Saw Marciano Sequin, PA with Dr. Thomasena Edis. Advised to ice and use a black flat shoe. Thought it might be tendonitis.  Got a toothache on the 14th, July. Dr. Ladona Ridgel thought there was an infection and gave her a Z-pak. Saw endodontist, Dr. Kathie Rhodes. [place]. To see Dr. Duffy Rhody who had scheduled a root canal and inspect for a crack in the tooth.  Has some pain in the anterior neck when she leans back at the dentist's office.  Sometimes balance seems off. Has not fallen.  Past Medical History  Diagnosis Date  . Chest pain at rest 01/07/2012  . Pain in joint, hand   . Unspecified hypothyroidism   . Inguinal hernia without mention of obstruction or gangrene, unilateral or unspecified, (not specified as recurrent)   . Senile osteoporosis   . Unspecified vitamin D deficiency   . Female stress incontinence   . Macular degeneration (senile) of retina, unspecified   . Unspecified glaucoma(365.9)   . Osteoarthrosis, unspecified whether generalized or localized, unspecified site   . Unspecified essential hypertension   . Internal hemorrhoids without mention of complication   . Lumbago   . Reflux esophagitis   . Displacement of cervical intervertebral  disc without myelopathy   . Other and unspecified hyperlipidemia   . Cervicalgia   . Scoliosis (and kyphoscoliosis), idiopathic   . Diverticulosis of colon (without mention of hemorrhage)     Past Surgical History  Procedure Laterality Date  . Abdominal hysterectomy    . Tonsillectomy    . Foot surgery  august 2013    Victorino Dike, MD  . Appendectomy  1988  . Eye surgery  2009    cataract removed right eye     Procedures: 1994-Colonoscopy:hemorrhoids 1997-Flex Sig -Dr.Edwards:internal hemorrhoids 1989-U/S abd.:normal 1991-BE:Diverticulosis 2001-Bone Density:OP, improved 2002-RAIU:thyroiditis 2002-CT pelvis:normal 2002-CT abd.:normal 2003- U/S bladder/kidney:normal 2002-Cystoscopy:normal 2003-Colonoscopy:normal 2003-Mammogram 2005-U/S Abd.:normal 2005-Mammogram 2006-Mammogram 08/2004 Bone density [facility]): osteoporosis 08/20/2006 Bone Density  09/24/2006 Colonoscopy  03/18/2007 Mammogram Normal 03/18/2008 Mammogram Normal  08/10/2008 Bilateral hip with pelvis: lower lumbar spondylosis and scoliosis, mild hip joint space loss bilateral, capsular calcifications associated with left hip probably post traumatic. 08/21/2008 Bone Density Osteoporosis 03/19/2009 Mammogram negative 03/21/2010 Mammogram Normal  12/02/2010 Bone Density  Consultants: GI-Dr.Edwards Urology-Dr.Davis Ortho.-Dr.Bednarz Cardiologist-Dr.Berry DDS-Dr.David Ladona Ridgel ENT-Dr.Chris [facility] Ophthalmology Dr Emmit Pomfret   Current Outpatient Prescriptions  Medication Sig Dispense Refill  . aspirin (ECOTRIN LOW STRENGTH) 81 MG EC tablet Take 81 mg by mouth daily.       . bimatoprost (LUMIGAN) 0.01 % SOLN Place 1 drop into both eyes at bedtime.      . calcium carbonate (TUMS - DOSED IN MG ELEMENTAL CALCIUM) 500 MG  chewable tablet Chew 3 tablets by mouth 2 (two) times daily.      . Cholecalciferol (VITAMIN D3) 2000 UNITS TABS Take 2,000 Units/day by mouth 2 (two) times daily.      .  famotidine (PEPCID) 20 MG tablet Take 20 mg by mouth at bedtime as needed for heartburn.      . Hypromellose 0.3 % SOLN Apply 1 drop to eye 3 (three) times daily.      . metoprolol succinate (TOPROL-XL) 25 MG 24 hr tablet Take 25 mg by mouth daily.      . Multiple Vitamins-Minerals (ICAPS PO) Take 1 tablet by mouth 2 (two) times daily.      . raloxifene (EVISTA) 60 MG tablet Take 1 tablet (60 mg total) by mouth daily.  30 tablet  5   No current facility-administered medications for this visit.     There is no immunization history on file for this patient.   Diet: regular  History  Substance Use Topics  . Smoking status: Never Smoker   . Smokeless tobacco: Never Used  . Alcohol Use: Not on file   MARITAL HISTORY:   Married. Widowed. HOUSING: The patient lives in a single level home. PERSONS IN HOME:  The patient lives alone. LIVING WILL:  The patient has a living will. OCCUPATION:   Retired. Teacher. TOBACCO USE:   Has no significant smoking history. ALCOHOL:   Drinks a minimal amount of alcohol. CAFFEINE:  Does not use caffeinated beverages. EXERCISES:   The exercise is predominantly walking. DIET:  Follows no specific diet. PETS IN HOME:  The patient has no pets.    Family History  Problem Relation Age of Onset  . CAD Mother   . Cancer Father     colon cancer   FATHER:    The father is deceased. of CA and colon and Parkinson's MOTHER:   The mother is deceased.OP, CAD SIBLINGS:   1)  The patient's brother is living.Russell-colon polyp  CHILDREN:   1)  The patient's daughter is living.[name]  2)  The patient's son is living.[name]      Review of Systems  Constitutional: Negative.   HENT: Negative.   Eyes:       Left cataract.  Respiratory: Negative.   Cardiovascular: Negative.   Gastrointestinal: Negative.   Endocrine: Negative.   Genitourinary:       Stress incontinence.  Musculoskeletal:       Some joint pains.  Skin: Negative.   Allergic/Immunologic:  Negative.   Neurological: Negative.   Hematological: Negative.   Psychiatric/Behavioral: Negative.      Vital signs: BP 122/80  Pulse 62  Temp(Src) 97.4 F (36.3 C) (Oral)  Resp 16  Ht 5' 2.01" (1.575 m)  Wt 124 lb 12.8 oz (56.609 kg)  BMI 22.82 kg/m2  SpO2 99%  Physical Exam  Constitutional: She appears well-developed and well-nourished. No distress.  HENT:  Head: Normocephalic and atraumatic.  Right Ear: External ear normal.  Left Ear: External ear normal.  Cerumen increased in both EAC,, but not occlusive.  Eyes: Conjunctivae and EOM are normal. Pupils are equal, round, and reactive to light.  Corrective lenses.  Neck: Neck supple. No JVD present. No tracheal deviation present. No thyromegaly present.  Cardiovascular: Normal rate, regular rhythm, normal heart sounds and intact distal pulses.  Exam reveals no gallop and no friction rub.   No murmur heard. Superficial varicose veins bilateerally.  Respiratory: No respiratory distress. She has no wheezes. She has no rales.  She exhibits no tenderness.  GI: She exhibits no distension and no mass. There is no tenderness.  Musculoskeletal: Normal range of motion. She exhibits no edema and no tenderness.  Lymphadenopathy:    She has no cervical adenopathy.  Skin:  Seborrheic keratosis at left neck   LABS REVIEWED 12/19/2010 CBC: WBC 6.1 RBC 3.90                  CMP :Glucose 75 BUN 29 Creatinine 0.79      LIPID : Cholesterol 166 Triglyceride 80 HDL 77 LDL 73                    TSH 3.760      VIT D 48.5 07/29/2009 CBC: normal CMP: Glucose 79, BUN 32, Creatinine 0.85 Lipid: cholesterol 170, triglycerides 86, HDL 74, LDL 79 TSH 4.610 Vitamin D 25.5 08/03/09 EKG: normal 12/19/2010 CBC Wbc 6.1 Rbc 3.90 Hemoglobin 11.8  CMP Glucose 75 Bun 29 Creatinine 0.79  Lipid Panel Cholesterol 166 Triglycerides 80 HDL 77 LDL 73  TSH 3.760  Vitamin D 25 Hydroxy 48.5  12/21/10 EKG: rate 53, NSR, Left atrial abn and T abn in anteriior  leads 12/25/2011  CMP: glucose 83, BUN 23, Creatinine 0.74, Alkaline Phos 43 Lipid: Cholesterol 173, Triglycerides 76, HDL 79, LDL 79 TSH: 6.850 Recent Results (from the past 2160 hour(s))  COMPREHENSIVE METABOLIC PANEL     Status: None   Collection Time    12/20/12  9:45 AM      Result Value Range   Glucose 84  65 - 99 mg/dL   BUN 21  8 - 27 mg/dL   Creatinine, Ser 1.61  0.57 - 1.00 mg/dL   GFR calc non Af Amer 64  >59 mL/min/1.73   GFR calc Af Amer 74  >59 mL/min/1.73   BUN/Creatinine Ratio 25  11 - 26   Sodium 139  134 - 144 mmol/L   Potassium 3.9  3.5 - 5.2 mmol/L   Chloride 101  97 - 108 mmol/L   CO2 25  18 - 29 mmol/L   Calcium 9.6  8.6 - 10.2 mg/dL   Total Protein 6.2  6.0 - 8.5 g/dL   Albumin 3.9  3.5 - 4.7 g/dL   Globulin, Total 2.3  1.5 - 4.5 g/dL   Albumin/Globulin Ratio 1.7  1.1 - 2.5   Total Bilirubin 0.1  0.0 - 1.2 mg/dL   Alkaline Phosphatase 48  39 - 117 IU/L   AST 19  0 - 40 IU/L   ALT 11  0 - 32 IU/L  LIPID PANEL     Status: None   Collection Time    12/20/12  9:45 AM      Result Value Range   Cholesterol, Total 199  100 - 199 mg/dL   Triglycerides 65  0 - 149 mg/dL   HDL 096  >04 mg/dL   Comment: According to ATP-III Guidelines, HDL-C >59 mg/dL is considered a     negative risk factor for CHD.   VLDL Cholesterol Cal 13  5 - 40 mg/dL   LDL Calculated 78  0 - 99 mg/dL   Chol/HDL Ratio 1.8  0.0 - 4.4 ratio units  TSH     Status: None   Collection Time    12/20/12  9:45 AM      Result Value Range   TSH 4.400  0.450 - 4.500 uIU/mL      Screening Score  MMS    PHQ2  0  PHQ9     Fall Risk    BIMS    Annual summary: Hospitalizations: nonde in the last year Infection History: none of significance Functional assessment: independent in all ADL Areas of potential improvement: none Prognosis for survival: good  Plan: Unspecified hypothyroidism: continue current medication  Senile osteoporosis: no new orders  Unspecified vitamin D deficiency;  continue supplements  Macular degeneration (senile) of retina, unspecified: continue with ophth  Unspecified glaucoma(365.9) continue with ophth  Unspecified essential hypertension; controlled   - Plan: metoprolol succinate (TOPROL-XL) 25 MG 24 hr tablet  Lumbago: improved  Reflux esophagitis: asymptomatic  Other and unspecified hyperlipidemia: controlled

## 2013-02-20 ENCOUNTER — Ambulatory Visit (INDEPENDENT_AMBULATORY_CARE_PROVIDER_SITE_OTHER): Payer: Medicare Other

## 2013-02-20 DIAGNOSIS — Z23 Encounter for immunization: Secondary | ICD-10-CM | POA: Diagnosis not present

## 2013-03-24 ENCOUNTER — Encounter: Payer: Self-pay | Admitting: Cardiovascular Disease

## 2013-03-24 ENCOUNTER — Ambulatory Visit (INDEPENDENT_AMBULATORY_CARE_PROVIDER_SITE_OTHER): Payer: Medicare Other | Admitting: Cardiovascular Disease

## 2013-03-24 VITALS — BP 140/68 | HR 55 | Ht 62.0 in | Wt 125.4 lb

## 2013-03-24 DIAGNOSIS — K219 Gastro-esophageal reflux disease without esophagitis: Secondary | ICD-10-CM | POA: Insufficient documentation

## 2013-03-24 DIAGNOSIS — R002 Palpitations: Secondary | ICD-10-CM | POA: Insufficient documentation

## 2013-03-24 MED ORDER — OMEPRAZOLE 20 MG PO CPDR: 20.0000 mg | DELAYED_RELEASE_CAPSULE | Freq: Every day | ORAL | Status: DC

## 2013-03-24 NOTE — Assessment & Plan Note (Signed)
History of palpitations the past on low-dose beta blockade currently asymptomatic

## 2013-03-24 NOTE — Progress Notes (Signed)
03/24/2013 [name]   23-Apr-1929  [id number]  Primary Physician GREEN, Lenon Curt, MD Primary Cardiologist: Runell Gess MD [name]   HPI:  The patient is an 79 year old thin appearing widowed Caucasian female mother of 2, grandmother of 2 grandchildren who I last saw 2 years ago. Her only symptoms at that time were palpitations on a low-dose beta blocker. She also had a history of GERD. She was admitted to [facility] on August 25 with chest pain. She ruled out for myocardial infarction. She had a Myoview stress test which was normal. She had no recurrent symptoms.     Current Outpatient Prescriptions  Medication Sig Dispense Refill  . acetaminophen (TYLENOL) 500 MG tablet Take 500 mg by mouth every 6 (six) hours as needed.      Marland Kitchen aspirin (ECOTRIN LOW STRENGTH) 81 MG EC tablet Take 81 mg by mouth daily.       . bimatoprost (LUMIGAN) 0.01 % SOLN Place 1 drop into both eyes at bedtime.      . calcium carbonate (TUMS - DOSED IN MG ELEMENTAL CALCIUM) 500 MG chewable tablet Chew 3 tablets by mouth 2 (two) times daily.      . cetirizine (ZYRTEC ALLERGY) 10 MG tablet Take 10 mg by mouth as needed for allergies.      . Cholecalciferol (VITAMIN D3) 2000 UNITS TABS Take 2,000 Units/day by mouth 2 (two) times daily.      . hydrocortisone cream 1 % Apply 1 application topically as needed for itching.      . Hypromellose 0.3 % SOLN Apply 1 drop to eye 3 (three) times daily.      . metoprolol succinate (TOPROL-XL) 25 MG 24 hr tablet One daily to regulate heart and control BP  90 tablet  3  . Multiple Vitamins-Minerals (ICAPS PO) Take 1 tablet by mouth 2 (two) times daily.      . raloxifene (EVISTA) 60 MG tablet Take 1 tablet (60 mg total) by mouth daily.  30 tablet  5  . omeprazole (PRILOSEC) 20 MG capsule Take 1 capsule (20 mg total) by mouth daily.  30 capsule  11   No current facility-administered medications for this visit.    Allergies  Allergen Reactions   . Latex   . Erythromycin   . Macrodantin [Nitrofurantoin Macrocrystal]   . Penicillins   . Septra [Sulfamethoxazole-Tmp Ds]   . Sulfa Antibiotics   . Trimethoprim     History   Social History  . Marital Status: Widowed    Spouse Name: N/A    Number of Children: N/A  . Years of Education: N/A   Occupational History  . Not on file.   Social History Main Topics  . Smoking status: Never Smoker   . Smokeless tobacco: Never Used  . Alcohol Use: No  . Drug Use: No  . Sexual Activity: Not on file   Other Topics Concern  . Not on file   Social History Narrative  . No narrative on file     Review of Systems: General: negative for chills, fever, night sweats or weight changes.  Cardiovascular: negative for chest pain, dyspnea on exertion, edema, orthopnea, palpitations, paroxysmal nocturnal dyspnea or shortness of breath Dermatological: negative for rash Respiratory: negative for cough or wheezing Urologic: negative for hematuria Abdominal: negative for nausea, vomiting, diarrhea, bright red blood per rectum, melena, or hematemesis Neurologic: negative for visual changes, syncope, or dizziness All other systems reviewed and are otherwise  negative except as noted above.    Blood pressure 140/68, pulse 55, height 5\' 2"  (1.575 m), weight 125 lb 6.4 oz (56.881 kg).  General appearance: alert and no distress Neck: no adenopathy, no carotid bruit, no JVD, supple, symmetrical, trachea midline and thyroid not enlarged, symmetric, no tenderness/mass/nodules Lungs: clear to auscultation bilaterally Heart: regular rate and rhythm, S1, S2 normal, no murmur, click, rub or gallop Extremities: extremities normal, atraumatic, no cyanosis or edema  EKG sinus bradycardia 55 without ST or T wave changes  ASSESSMENT AND PLAN:   Palpitations History of palpitations the past on low-dose beta blockade currently asymptomatic      Runell Gess MD [facility],  [facility] 03/24/2013 4:51 PM

## 2013-03-24 NOTE — Patient Instructions (Signed)
  We will see you back in follow up in 1 year with Dr Allyson Sabal  Dr Allyson Sabal has ordered for you to start omeprazole 20mg  daily.

## 2013-03-25 ENCOUNTER — Encounter: Payer: Self-pay | Admitting: Cardiovascular Disease

## 2013-03-31 DIAGNOSIS — Z1231 Encounter for screening mammogram for malignant neoplasm of breast: Secondary | ICD-10-CM | POA: Diagnosis not present

## 2013-04-07 DIAGNOSIS — Z800 Family history of malignant neoplasm of digestive organs: Secondary | ICD-10-CM | POA: Diagnosis not present

## 2013-04-07 LAB — HM MAMMOGRAPHY

## 2013-04-14 ENCOUNTER — Encounter: Payer: Self-pay | Admitting: Internal Medicine

## 2013-04-21 DIAGNOSIS — H4011X0 Primary open-angle glaucoma, stage unspecified: Secondary | ICD-10-CM | POA: Diagnosis not present

## 2013-04-21 DIAGNOSIS — H409 Unspecified glaucoma: Secondary | ICD-10-CM | POA: Diagnosis not present

## 2013-04-21 DIAGNOSIS — H2640 Unspecified secondary cataract: Secondary | ICD-10-CM | POA: Diagnosis not present

## 2013-04-21 DIAGNOSIS — H04129 Dry eye syndrome of unspecified lacrimal gland: Secondary | ICD-10-CM | POA: Diagnosis not present

## 2013-04-23 ENCOUNTER — Telehealth: Payer: Self-pay | Admitting: *Deleted

## 2013-04-23 NOTE — Telephone Encounter (Signed)
Patient stated that Evista is no longer covered by her insurance and wants to know if the generic is ok to take or if you suggest something else. Wants the best for her condition. Please Advise.

## 2013-04-23 NOTE — Telephone Encounter (Signed)
If there is a generic Evista equivalent, this would be my first choice. Otherwise we could check on the cost of Prolia for her Osteoporosis.

## 2013-04-25 ENCOUNTER — Other Ambulatory Visit: Payer: Self-pay | Admitting: *Deleted

## 2013-04-25 MED ORDER — RALOXIFENE HCL 60 MG PO TABS: 60.0000 mg | ORAL_TABLET | Freq: Every day | ORAL | Status: DC

## 2013-04-25 NOTE — Telephone Encounter (Signed)
Patient Notified and Faxed Rx to [facility]

## 2013-04-29 ENCOUNTER — Encounter: Payer: Self-pay | Admitting: Internal Medicine

## 2013-05-01 ENCOUNTER — Emergency Department (HOSPITAL_COMMUNITY): Payer: Medicare Other

## 2013-05-01 ENCOUNTER — Inpatient Hospital Stay (HOSPITAL_COMMUNITY)
Admission: EM | Admit: 2013-05-01 | Discharge: 2013-05-02 | DRG: 069 | Disposition: A | Discharge status: Home / Self Care | Payer: Medicare Other | Attending: Internal Medicine | Admitting: Internal Medicine

## 2013-05-01 ENCOUNTER — Encounter (HOSPITAL_COMMUNITY): Payer: Self-pay | Admitting: Radiology

## 2013-05-01 DIAGNOSIS — R0989 Other specified symptoms and signs involving the circulatory and respiratory systems: Secondary | ICD-10-CM | POA: Diagnosis not present

## 2013-05-01 DIAGNOSIS — M1990 Unspecified osteoarthritis, unspecified site: Secondary | ICD-10-CM | POA: Diagnosis present

## 2013-05-01 DIAGNOSIS — H3530 Unspecified macular degeneration: Secondary | ICD-10-CM | POA: Diagnosis present

## 2013-05-01 DIAGNOSIS — Z79899 Other long term (current) drug therapy: Secondary | ICD-10-CM

## 2013-05-01 DIAGNOSIS — R209 Unspecified disturbances of skin sensation: Secondary | ICD-10-CM | POA: Diagnosis present

## 2013-05-01 DIAGNOSIS — E559 Vitamin D deficiency, unspecified: Secondary | ICD-10-CM | POA: Diagnosis present

## 2013-05-01 DIAGNOSIS — E039 Hypothyroidism, unspecified: Secondary | ICD-10-CM | POA: Diagnosis present

## 2013-05-01 DIAGNOSIS — I6350 Cerebral infarction due to unspecified occlusion or stenosis of unspecified cerebral artery: Secondary | ICD-10-CM

## 2013-05-01 DIAGNOSIS — I10 Essential (primary) hypertension: Secondary | ICD-10-CM | POA: Diagnosis not present

## 2013-05-01 DIAGNOSIS — M810 Age-related osteoporosis without current pathological fracture: Secondary | ICD-10-CM | POA: Diagnosis present

## 2013-05-01 DIAGNOSIS — K5730 Diverticulosis of large intestine without perforation or abscess without bleeding: Secondary | ICD-10-CM | POA: Diagnosis present

## 2013-05-01 DIAGNOSIS — K2100 Gastro-esophageal reflux disease with esophagitis, without bleeding: Secondary | ICD-10-CM | POA: Diagnosis not present

## 2013-05-01 DIAGNOSIS — G459 Transient cerebral ischemic attack, unspecified: Secondary | ICD-10-CM | POA: Diagnosis not present

## 2013-05-01 DIAGNOSIS — K219 Gastro-esophageal reflux disease without esophagitis: Secondary | ICD-10-CM | POA: Diagnosis present

## 2013-05-01 DIAGNOSIS — E785 Hyperlipidemia, unspecified: Secondary | ICD-10-CM | POA: Diagnosis present

## 2013-05-01 DIAGNOSIS — I639 Cerebral infarction, unspecified: Secondary | ICD-10-CM

## 2013-05-01 DIAGNOSIS — Z7982 Long term (current) use of aspirin: Secondary | ICD-10-CM | POA: Diagnosis not present

## 2013-05-01 DIAGNOSIS — R5381 Other malaise: Secondary | ICD-10-CM | POA: Diagnosis not present

## 2013-05-01 LAB — COMPREHENSIVE METABOLIC PANEL
BUN: 19 mg/dL (ref 6–23)
CO2: 28 mEq/L (ref 19–32)
Calcium: 9.4 mg/dL (ref 8.4–10.5)
Creatinine, Ser: 0.82 mg/dL (ref 0.50–1.10)
GFR calc Af Amer: 75 mL/min — ABNORMAL LOW (ref >90)
GFR calc non Af Amer: 65 mL/min — ABNORMAL LOW (ref >90)
Glucose, Bld: 153 mg/dL — ABNORMAL HIGH (ref 70–99)

## 2013-05-01 LAB — URINE MICROSCOPIC-ADD ON

## 2013-05-01 LAB — DIFFERENTIAL
Eosinophils Relative: 1 % (ref 0–5)
Lymphocytes Relative: 30 % (ref 12–46)
Lymphs Abs: 2.1 10*3/uL (ref 0.7–4.0)
Monocytes Absolute: 0.5 10*3/uL (ref 0.1–1.0)
Monocytes Relative: 7 % (ref 3–12)
Neutro Abs: 4.4 10*3/uL (ref 1.7–7.7)

## 2013-05-01 LAB — URINALYSIS, ROUTINE W REFLEX MICROSCOPIC
Bilirubin Urine: NEGATIVE
Glucose, UA: NEGATIVE mg/dL
Hgb urine dipstick: NEGATIVE
Nitrite: NEGATIVE
Protein, ur: NEGATIVE mg/dL
Specific Gravity, Urine: 1.01 (ref 1.005–1.030)
pH: 6 (ref 5.0–8.0)

## 2013-05-01 LAB — PROTIME-INR: Prothrombin Time: 12.2 seconds (ref 11.6–15.2)

## 2013-05-01 LAB — APTT: aPTT: 33 seconds (ref 24–37)

## 2013-05-01 LAB — POCT I-STAT, CHEM 8
BUN: 20 mg/dL (ref 6–23)
Calcium, Ion: 1.24 mmol/L (ref 1.13–1.30)
Chloride: 103 meq/L (ref 96–112)
Creatinine, Ser: 0.9 mg/dL (ref 0.50–1.10)
Glucose, Bld: 155 mg/dL — ABNORMAL HIGH (ref 70–99)
HCT: 43 % (ref 36.0–46.0)
Hemoglobin: 14.6 g/dL (ref 12.0–15.0)
Potassium: 3.8 meq/L (ref 3.5–5.1)
Sodium: 141 meq/L (ref 135–145)
TCO2: 26 mmol/L (ref 0–100)

## 2013-05-01 LAB — TROPONIN I: Troponin I: <0.3 ng/mL (ref <0.30)

## 2013-05-01 LAB — CBC
HCT: 40.2 % (ref 36.0–46.0)
MCH: 31.5 pg (ref 26.0–34.0)
MCV: 94.6 fL (ref 78.0–100.0)
RBC: 4.25 MIL/uL (ref 3.87–5.11)
RDW: 14.6 % (ref 11.5–15.5)
WBC: 7.1 10*3/uL (ref 4.0–10.5)

## 2013-05-01 LAB — RAPID URINE DRUG SCREEN, HOSP PERFORMED
Cocaine: NOT DETECTED
Opiates: NOT DETECTED

## 2013-05-01 LAB — ETHANOL: Alcohol, Ethyl (B): <11 mg/dL (ref 0–11)

## 2013-05-01 MED ORDER — METOPROLOL SUCCINATE ER 25 MG PO TB24
25.0000 mg | ORAL_TABLET | Freq: Every day | ORAL | Status: DC
  Administered 2013-05-02: 25 mg via ORAL
  Filled 2013-05-01: qty 1

## 2013-05-01 MED ORDER — LATANOPROST 0.005 % OP SOLN
1.0000 [drp] | Freq: Every day | OPHTHALMIC | Status: DC
  Administered 2013-05-01: 1 [drp] via OPHTHALMIC
  Filled 2013-05-01: qty 2.5

## 2013-05-01 MED ORDER — CALCIUM CARBONATE ANTACID 500 MG PO CHEW
3.0000 | CHEWABLE_TABLET | Freq: Two times a day (BID) | ORAL | Status: DC
  Administered 2013-05-01 – 2013-05-02 (×2): 600 mg via ORAL
  Filled 2013-05-01 (×3): qty 3

## 2013-05-01 MED ORDER — POLYETHYLENE GLYCOL 3350 17 G PO PACK
17.0000 g | PACK | Freq: Every day | ORAL | Status: DC
  Administered 2013-05-02: 17 g via ORAL
  Filled 2013-05-01 (×2): qty 1

## 2013-05-01 MED ORDER — HYPROMELLOSE 0.3 % OP SOLN: 1.0000 [drp] | Freq: Three times a day (TID) | OPHTHALMIC | Status: DC

## 2013-05-01 MED ORDER — OCUVITE-LUTEIN PO CAPS
1.0000 | ORAL_CAPSULE | Freq: Every day | ORAL | Status: DC
  Administered 2013-05-01 – 2013-05-02 (×2): 1 via ORAL
  Filled 2013-05-01 (×2): qty 1

## 2013-05-01 MED ORDER — ONDANSETRON HCL 4 MG/2ML IJ SOLN: 4.0000 mg | Freq: Three times a day (TID) | INTRAMUSCULAR | Status: DC | PRN

## 2013-05-01 MED ORDER — POLYVINYL ALCOHOL 1.4 % OP SOLN
1.0000 [drp] | Freq: Three times a day (TID) | OPHTHALMIC | Status: DC
  Administered 2013-05-01 – 2013-05-02 (×2): 1 [drp] via OPHTHALMIC
  Filled 2013-05-01 (×2): qty 15

## 2013-05-01 MED ORDER — ICAPS PO CAPS: ORAL_CAPSULE | Freq: Two times a day (BID) | ORAL | Status: DC

## 2013-05-01 MED ORDER — CLOPIDOGREL BISULFATE 75 MG PO TABS
75.0000 mg | ORAL_TABLET | Freq: Every day | ORAL | Status: DC
  Administered 2013-05-02: 75 mg via ORAL
  Filled 2013-05-01 (×2): qty 1

## 2013-05-01 MED ORDER — RALOXIFENE HCL 60 MG PO TABS
60.0000 mg | ORAL_TABLET | Freq: Every day | ORAL | Status: DC
  Filled 2013-05-01 (×2): qty 1

## 2013-05-01 MED ORDER — HYDROCORTISONE 1 % EX CREA: 1.0000 "application " | TOPICAL_CREAM | CUTANEOUS | Status: DC | PRN

## 2013-05-01 MED ORDER — ENOXAPARIN SODIUM 40 MG/0.4ML ~~LOC~~ SOLN
40.0000 mg | SUBCUTANEOUS | Status: DC
  Administered 2013-05-01: 40 mg via SUBCUTANEOUS
  Filled 2013-05-01 (×2): qty 0.4

## 2013-05-01 MED ORDER — ACETAMINOPHEN 500 MG PO TABS: 500.0000 mg | ORAL_TABLET | Freq: Four times a day (QID) | ORAL | Status: DC | PRN

## 2013-05-01 MED ORDER — PANTOPRAZOLE SODIUM 40 MG PO TBEC
40.0000 mg | DELAYED_RELEASE_TABLET | Freq: Every day | ORAL | Status: DC
  Administered 2013-05-02: 40 mg via ORAL
  Filled 2013-05-01: qty 1

## 2013-05-01 NOTE — ED Provider Notes (Signed)
CSN: [id number]     Arrival date & time 05/01/13  1313 History   First MD Initiated Contact with Patient 05/01/13 1327     Chief Complaint - weakness  Patient is a 79 y.o. female presenting with weakness. The history is provided by the patient. The history is limited by the condition of the patient.  Weakness This is a new problem. The current episode started 1 to 2 hours ago. The problem occurs constantly. The problem has not changed since onset.Associated symptoms include headaches. Pertinent negatives include no chest pain. Nothing aggravates the symptoms. Nothing relieves the symptoms.  pt reports that 2 hrs ago she had onset of left sided headache, left sided facial numbness and left UE numbness She had otherwise been at her baseline  Past Medical History  Diagnosis Date  . Chest pain at rest 01/07/2012  . Pain in joint, hand   . Unspecified hypothyroidism   . Inguinal hernia without mention of obstruction or gangrene, unilateral or unspecified, (not specified as recurrent)   . Senile osteoporosis   . Unspecified vitamin D deficiency   . Female stress incontinence   . Macular degeneration (senile) of retina, unspecified   . Unspecified glaucoma(365.9)   . Osteoarthrosis, unspecified whether generalized or localized, unspecified site   . Unspecified essential hypertension   . Internal hemorrhoids without mention of complication   . Lumbago   . Reflux esophagitis   . Displacement of cervical intervertebral disc without myelopathy   . Other and unspecified hyperlipidemia   . Cervicalgia   . Scoliosis (and kyphoscoliosis), idiopathic   . Diverticulosis of colon (without mention of hemorrhage)   . Palpitations    Past Surgical History  Procedure Laterality Date  . Abdominal hysterectomy    . Tonsillectomy    . Foot surgery  august 2013    Victorino Dike, MD  . Appendectomy  1988  . Eye surgery  2009    cataract removed right eye   Family History  Problem Relation Age of Onset  .  CAD Mother   . Cancer Father     colon cancer   History  Substance Use Topics  . Smoking status: Never Smoker   . Smokeless tobacco: Never Used  . Alcohol Use: No   OB History   Grav Para Term Preterm Abortions TAB SAB Ect Mult Living                 Review of Systems  Unable to perform ROS: Acuity of condition  Cardiovascular: Negative for chest pain.  Neurological: Positive for weakness and headaches.    Allergies  Latex; Erythromycin; Macrodantin; Penicillins; Septra; Sulfa antibiotics; and Trimethoprim  Home Medications   Current Outpatient Rx  Name  Route  Sig  Dispense  Refill  . acetaminophen (TYLENOL) 500 MG tablet   Oral   Take 500 mg by mouth every 6 (six) hours as needed.         Marland Kitchen aspirin (ECOTRIN LOW STRENGTH) 81 MG EC tablet   Oral   Take 81 mg by mouth daily.          . bimatoprost (LUMIGAN) 0.01 % SOLN   Both Eyes   Place 1 drop into both eyes at bedtime.         . calcium carbonate (TUMS - DOSED IN MG ELEMENTAL CALCIUM) 500 MG chewable tablet   Oral   Chew 3 tablets by mouth 2 (two) times daily.         . cetirizine (  ZYRTEC ALLERGY) 10 MG tablet   Oral   Take 10 mg by mouth as needed for allergies.         . Cholecalciferol (VITAMIN D3) 2000 UNITS TABS   Oral   Take 2,000 Units/day by mouth 2 (two) times daily.         . hydrocortisone cream 1 %   Topical   Apply 1 application topically as needed for itching.         . Hypromellose 0.3 % SOLN   Ophthalmic   Apply 1 drop to eye 3 (three) times daily.         . metoprolol succinate (TOPROL-XL) 25 MG 24 hr tablet      One daily to regulate heart and control BP   90 tablet   3   . Multiple Vitamins-Minerals (ICAPS PO)   Oral   Take 1 tablet by mouth 2 (two) times daily.         Marland Kitchen omeprazole (PRILOSEC) 20 MG capsule   Oral   Take 1 capsule (20 mg total) by mouth daily.   30 capsule   11   . raloxifene (EVISTA) 60 MG tablet   Oral   Take 1 tablet (60 mg total)  by mouth daily. Generic ok   90 tablet   3    BP 165/85  Pulse 74  Temp(Src) 97.9 F (36.6 C) (Oral)  Resp 22  Ht 5\' 2"  (1.575 m)  Wt 120 lb (54.432 kg)  BMI 21.94 kg/m2  SpO2 97% Physical Exam CONSTITUTIONAL: Well developed/well nourished HEAD: Normocephalic/atraumatic EYES: EOMI/PERRL ENMT: Mucous membranes moist NECK: supple no meningeal signs SPINE:entire spine nontender CV: S1/S2 noted, no murmurs/rubs/gallops noted LUNGS: Lungs are clear to auscultation bilaterally, no apparent distress ABDOMEN: soft, nontender, no rebound or guarding GU:no cva tenderness NEURO: Pt is awake/alert, moves all extremitiesx4, no arm/leg drift.  No facial droop Only mild numbness reported to left face EXTREMITIES: pulses normal, full ROM SKIN: warm, color normal PSYCH: no abnormalities of mood noted  ED Course  Procedures (including critical care time) 1:32 PM Pt seen on arrival to room for concern for code stroke Evaluation initiated for code stroke 2:24 PM Pt improving D/w dr Thad Ranger, given symptoms improving and minimal symptoms, she is not a thrombolytic candidate tPA in stroke considered but not given due to:   Symptoms resolved Rapid improvement/severity mild  2:59 PM D/w dr Jerral Ralph, will admit Pt stabilized in the ER  Labs Review Labs Reviewed  ETHANOL  PROTIME-INR  APTT  CBC  DIFFERENTIAL  COMPREHENSIVE METABOLIC PANEL  TROPONIN I  URINE RAPID DRUG SCREEN (HOSP PERFORMED)  URINALYSIS, ROUTINE W REFLEX MICROSCOPIC   Imaging Review No results found.  EKG Interpretation    Date/Time:  Thursday May 01 2013 13:14:56 EST Ventricular Rate:  74 PR Interval:  154 QRS Duration: 74 QT Interval:  392 QTC Calculation: 435 R Axis:   25 Text Interpretation:  Normal sinus rhythm Possible Left atrial enlargement ST \\T \ T wave abnormality, consider lateral ischemia Abnormal ECG Confirmed by Bebe Shaggy  MD, Paullette Mckain [phone] on 05/01/2013 2:24:27 PM             MDM  No diagnosis found. Nursing notes including past medical history and social history reviewed and considered in documentation Labs/vital reviewed and considered     Joya Gaskins, MD 05/01/13 1459

## 2013-05-01 NOTE — Code Documentation (Addendum)
79yo female arriving to [facility] at 74 via private vehicle.  Patient reports she was driving and that her left face and arm suddenly felt "funny" at 1115.  Patient reports that this was a new onset and she had no symptoms prior to 1115.  Code stroke called at 1332.  CT completed.  NIHSS 1 on arrival for decreased sensation in the left arm.  Patient reports that she still has a "funny" feeling in her left face and arm.  Dr. Thad Ranger at bedside for assessment, no acute stroke intervention at this time.  Patient remains in the window for treatment with tPA until 1545 should symptoms worsen.  Bedside handoff with ED RN Lawson Fiscal. Research RN notified for possible enrollment into research trial.

## 2013-05-01 NOTE — ED Notes (Signed)
Per pt sts she was at [employer] and had sudden onset of HA, left sided facial numbness and left arm numbness. sts better but still there. No facial droop noted. A&Ox4. Grip strength slightly weaker on left.

## 2013-05-01 NOTE — ED Notes (Signed)
Reported to Dr. Ladona Ridgel , pt. Having mild pain behind her lt. Ear.  No orders received

## 2013-05-01 NOTE — Consult Note (Signed)
Referring Physician: Bebe Shaggy    Chief Complaint: Left sided numbness  HPI: [name] is an 79 y.o. female who reports that she was driving to a luncheon and noted the onset of numbness on the left side of her face that was most prominent at the nose and around the eye.  Not long after she began to note numbness along the left arm as well.  The leg was never involved.  She went to lunch and was able to eat but her symptoms continued and she presented for evaluation.  Code stroke was called at presentation.  Initial NIHSS of 1.  Date last known well: Date: 05/01/2013 Time last known well: Time: 11:15 tPA Given: No: Mild symptoms that are improving.    Past Medical History  Diagnosis Date  . Chest pain at rest 01/07/2012  . Pain in joint, hand   . Unspecified hypothyroidism   . Inguinal hernia without mention of obstruction or gangrene, unilateral or unspecified, (not specified as recurrent)   . Senile osteoporosis   . Unspecified vitamin D deficiency   . Female stress incontinence   . Macular degeneration (senile) of retina, unspecified   . Unspecified glaucoma(365.9)   . Osteoarthrosis, unspecified whether generalized or localized, unspecified site   . Unspecified essential hypertension   . Internal hemorrhoids without mention of complication   . Lumbago   . Reflux esophagitis   . Displacement of cervical intervertebral disc without myelopathy   . Other and unspecified hyperlipidemia   . Cervicalgia   . Scoliosis (and kyphoscoliosis), idiopathic   . Diverticulosis of colon (without mention of hemorrhage)   . Palpitations     Past Surgical History  Procedure Laterality Date  . Abdominal hysterectomy    . Tonsillectomy    . Foot surgery  august 2013    Victorino Dike, MD  . Appendectomy  1988  . Eye surgery  2009    cataract removed right eye    Family History  Problem Relation Age of Onset  . CAD Mother   . Cancer Father     colon cancer   Social History:  reports  that she has never smoked. She has never used smokeless tobacco. She reports that she does not drink alcohol or use illicit drugs.  Allergies:  Allergies  Allergen Reactions  . Latex   . Erythromycin   . Macrodantin [Nitrofurantoin Macrocrystal]   . Penicillins   . Septra [Sulfamethoxazole-Tmp Ds]   . Sulfa Antibiotics   . Trimethoprim     Medications: I have reviewed the patient's current medications. Prior to Admission:   Current outpatient prescriptions: acetaminophen (TYLENOL) 500 MG tablet, Take 500 mg by mouth every 6 (six) hours as needed., Disp: , Rfl: ;   aspirin (ECOTRIN LOW STRENGTH) 81 MG EC tablet, Take 81 mg by mouth daily. , Disp: , Rfl: ;   bimatoprost (LUMIGAN) 0.01 % SOLN, Place 1 drop into both eyes at bedtime., Disp: , Rfl:  calcium carbonate (TUMS - DOSED IN MG ELEMENTAL CALCIUM) 500 MG chewable tablet, Chew 3 tablets by mouth 2 (two) times daily., Disp: , Rfl: ;   cetirizine (ZYRTEC ALLERGY) 10 MG tablet, Take 10 mg by mouth as needed for allergies., Disp: , Rfl: ;   Cholecalciferol (VITAMIN D3) 2000 UNITS TABS, Take 2,000 Units/day by mouth 2 (two) times daily., Disp: , Rfl:  hydrocortisone cream 1 %, Apply 1 application topically as needed for itching., Disp: , Rfl: ;   Hypromellose 0.3 % SOLN, Apply  1 drop to eye 3 (three) times daily., Disp: , Rfl: ;   metoprolol succinate (TOPROL-XL) 25 MG 24 hr tablet, One daily to regulate heart and control BP, Disp: 90 tablet, Rfl: 3;   Multiple Vitamins-Minerals (ICAPS PO), Take 1 tablet by mouth 2 (two) times daily., Disp: , Rfl:  omeprazole (PRILOSEC) 20 MG capsule, Take 1 capsule (20 mg total) by mouth daily., Disp: 30 capsule, Rfl: 11;   raloxifene (EVISTA) 60 MG tablet, Take 1 tablet (60 mg total) by mouth daily. Generic ok, Disp: 90 tablet, Rfl: 3  ROS: History obtained from the patient  General ROS: negative for - chills, fatigue, fever, night sweats, weight gain or weight loss Psychological ROS: negative for -  behavioral disorder, hallucinations, memory difficulties, mood swings or suicidal ideation Ophthalmic ROS: negative for - blurry vision, double vision, eye pain or loss of vision ENT ROS: negative for - epistaxis, nasal discharge, oral lesions, sore throat, tinnitus or vertigo Allergy and Immunology ROS: negative for - hives or itchy/watery eyes Hematological and Lymphatic ROS: negative for - bleeding problems, bruising or swollen lymph nodes Endocrine ROS: negative for - galactorrhea, hair pattern changes, polydipsia/polyuria or temperature intolerance Respiratory ROS: negative for - cough, hemoptysis, shortness of breath or wheezing Cardiovascular ROS: negative for - chest pain, dyspnea on exertion, edema or irregular heartbeat Gastrointestinal ROS: negative for - abdominal pain, diarrhea, hematemesis, nausea/vomiting or stool incontinence Genito-Urinary ROS: negative for - dysuria, hematuria, incontinence or urinary frequency/urgency Musculoskeletal ROS: negative for - joint swelling or muscular weakness Neurological ROS: as noted in HPI Dermatological ROS: negative for rash and skin lesion changes  Physical Examination: Blood pressure 165/85, pulse 74, temperature 97.9 F (36.6 C), temperature source Oral, resp. rate 22, height 5\' 2"  (1.575 m), weight 54.432 kg (120 lb), SpO2 97.00%.  Neurologic Examination: Mental Status: Alert, oriented, thought content appropriate.  Speech fluent without evidence of aphasia.  Able to follow 3 step commands without difficulty. Cranial Nerves: II: Discs flat bilaterally; Visual fields grossly normal, pupils equal, round, reactive to light and accommodation III,IV, VI: ptosis not present, extra-ocular motions intact bilaterally V,VII: decreased left NLF, facial light touch sensation normal bilaterally VIII: hearing normal bilaterally IX,X: gag reflex present XI: bilateral shoulder shrug XII: midline tongue extension Motor: Right : Upper extremity    5/5    Left:     Upper extremity   5/5  Lower extremity   5/5     Lower extremity   5/5 Tone and bulk:normal tone throughout; no atrophy noted Sensory: Decreased sensation on the left side of the nose and around the left eye.  Decreased sensation at the left shoulder and elbow. Deep Tendon Reflexes: 2+ and symmetric throughout Plantars: Right: downgoing   Left: downgoing Cerebellar: normal finger-to-nose and normal heel-to-shin test Gait: Unable to test CV: pulses palpable throughout    Laboratory Studies:  Basic Metabolic Panel:  Recent Labs Lab 05/01/13 1342 05/01/13 1357  NA 139 141  K 3.8 3.8  CL 103 103  CO2 28  --   GLUCOSE 153* 155*  BUN 19 20  CREATININE 0.82 0.90  CALCIUM 9.4  --     Liver Function Tests:  Recent Labs Lab 05/01/13 1342  AST 24  ALT 18  ALKPHOS 50  BILITOT 0.2*  PROT 7.2  ALBUMIN 3.7   No results found for this basename: LIPASE, AMYLASE,  in the last 168 hours No results found for this basename: AMMONIA,  in the last 168 hours  CBC:  Recent Labs Lab 05/01/13 1342 05/01/13 1357  WBC 7.1  --   NEUTROABS 4.4  --   HGB 13.4 14.6  HCT 40.2 43.0  MCV 94.6  --   PLT 236  --     Cardiac Enzymes:  Recent Labs Lab 05/01/13 1343  TROPONINI <0.30    BNP: No components found with this basename: POCBNP,   CBG:  Recent Labs Lab 05/01/13 1357  GLUCAP 154*    Microbiology: No results found for this or any previous visit.  Coagulation Studies:  Recent Labs  05/01/13 1342  LABPROT 12.2  INR 0.92    Urinalysis:  Recent Labs Lab 05/01/13 1353  COLORURINE YELLOW  LABSPEC 1.010  PHURINE 6.0  GLUCOSEU NEGATIVE  HGBUR NEGATIVE  BILIRUBINUR NEGATIVE  KETONESUR NEGATIVE  PROTEINUR NEGATIVE  UROBILINOGEN 0.2  NITRITE NEGATIVE  LEUKOCYTESUR TRACE*    Lipid Panel:    Component Value Date/Time   TRIG 65 12/20/2012 0945   HDL 108 12/20/2012 0945   CHOLHDL 1.8 12/20/2012 0945   LDLCALC 78 12/20/2012 0945    HgbA1C:   No results found for this basename: HGBA1C    Urine Drug Screen:   No results found for this basename: labopia, cocainscrnur, labbenz, amphetmu, thcu, labbarb    Alcohol Level:  Recent Labs Lab 05/01/13 1342  ETH <11    Other results: EKG: normal sinus rhythm at 74 bpm.  Imaging: Ct Head Wo Contrast  05/01/2013   CLINICAL DATA:  Weakness  EXAM: CT HEAD WITHOUT CONTRAST  TECHNIQUE: Contiguous axial images were obtained from the base of the skull through the vertex without intravenous contrast.  COMPARISON:  None.  FINDINGS: There is no evidence of mass effect, midline shift or extra-axial fluid collections. There is no evidence of a space-occupying lesion or intracranial hemorrhage. There is no evidence of a cortical-based area of acute infarction. There is generalized cerebral atrophy.  The ventricles and sulci are appropriate for the patient's age. The basal cisterns are patent.  Visualized portions of the orbits are unremarkable. The visualized portions of the paranasal sinuses and mastoid air cells are unremarkable.  The osseous structures are unremarkable.  IMPRESSION: No acute intracranial pathology.   Electronically Signed   By: Elige Ko   On: 05/01/2013 13:48    Assessment: 79 y.o. female presenting with complaints of left facial and left arm weakness.  Symptoms persist although mild.  Head CT has been reviewed and shows no acute changes.  Patient with vascular risk factors.  Can not rule out the possibility of a lacunar infarct.    Stroke Risk Factors - hyperlipidemia and hypertension  Plan: 1. HgbA1c, fasting lipid panel 2. MRI, MRA  of the brain without contrast 3. PT consult, OT consult, Speech consult 4. Echocardiogram 5. Carotid dopplers 6. Prophylactic therapy-Antiplatelet med: Plavix - dose 75 mg daily 7. Risk factor modification 8. Telemetry monitoring 9. Frequent neuro checks   Thana Farr, MD [facility] [phone] 05/01/2013, 2:30  PM

## 2013-05-01 NOTE — ED Notes (Signed)
Pt. Returned from CT scan, had void on bedpan,  Stroke team at the bedside.

## 2013-05-01 NOTE — H&P (Addendum)
PATIENT DETAILS Name: [name] Age: 79 y.o. Sex: female Date of Birth: February 23, 1929 Admit Date: 05/01/2013 EXB:MWUXL, Lenon Curt, MD  CHIEF COMPLAINT:  Left facial numbness and left arm numbness-started around 11:15 this morning.  HPI: [name] is a 79 y.o. female with a Past Medical History of hypertension, gastroesophageal reflux disease, osteoporosis who presents today with the above noted complaint. Per patient, she was in her usual state of health and was going to church and [address] when she started experiencing left facial numbness and left arm tingling/numbness around 11:15 this morning. She denied any weakness, or difficulty in speech. She denied any involvement of the left leg. Since his symptoms are persistent, one of her friends brought her to the emergency room. Code stroke was called, however NIHSS was 1, neurology was consulted, and the hospitalist service was asked to admit this patient for further evaluation and treatment  Patient denied any vomiting, but did think have some nausea. She also claims to have some headache earlier this morning. There is no history of fever, abdominal pain,chest pain.  ALLERGIES:   Allergies  Allergen Reactions  . Latex   . Erythromycin   . Macrodantin [Nitrofurantoin Macrocrystal]   . Penicillins   . Septra [Sulfamethoxazole-Tmp Ds]   . Sulfa Antibiotics   . Trimethoprim     PAST MEDICAL HISTORY: Past Medical History  Diagnosis Date  . Chest pain at rest 01/07/2012  . Pain in joint, hand   . Unspecified hypothyroidism   . Inguinal hernia without mention of obstruction or gangrene, unilateral or unspecified, (not specified as recurrent)   . Senile osteoporosis   . Unspecified vitamin D deficiency   . Female stress incontinence   . Macular degeneration (senile) of retina, unspecified   . Unspecified glaucoma(365.9)   . Osteoarthrosis, unspecified whether generalized or localized, unspecified site   .  Unspecified essential hypertension   . Internal hemorrhoids without mention of complication   . Lumbago   . Reflux esophagitis   . Displacement of cervical intervertebral disc without myelopathy   . Other and unspecified hyperlipidemia   . Cervicalgia   . Scoliosis (and kyphoscoliosis), idiopathic   . Diverticulosis of colon (without mention of hemorrhage)   . Palpitations     PAST SURGICAL HISTORY: Past Surgical History  Procedure Laterality Date  . Abdominal hysterectomy    . Tonsillectomy    . Foot surgery  august 2013    Victorino Dike, MD  . Appendectomy  1988  . Eye surgery  2009    cataract removed right eye    MEDICATIONS AT HOME: Prior to Admission medications   Medication Sig Start Date End Date Taking? Authorizing Provider  acetaminophen (TYLENOL) 500 MG tablet Take 500 mg by mouth every 6 (six) hours as needed.    Historical Provider, MD  aspirin (ECOTRIN LOW STRENGTH) 81 MG EC tablet Take 81 mg by mouth daily.     Historical Provider, MD  bimatoprost (LUMIGAN) 0.01 % SOLN Place 1 drop into both eyes at bedtime.    Historical Provider, MD  calcium carbonate (TUMS - DOSED IN MG ELEMENTAL CALCIUM) 500 MG chewable tablet Chew 3 tablets by mouth 2 (two) times daily.    Historical Provider, MD  cetirizine (ZYRTEC ALLERGY) 10 MG tablet Take 10 mg by mouth as needed for allergies.    Historical Provider, MD  Cholecalciferol (VITAMIN D3) 2000 UNITS TABS Take 2,000 Units/day by mouth 2 (two) times daily.    Historical Provider,  MD  hydrocortisone cream 1 % Apply 1 application topically as needed for itching.    Historical Provider, MD  Hypromellose 0.3 % SOLN Apply 1 drop to eye 3 (three) times daily.    Historical Provider, MD  metoprolol succinate (TOPROL-XL) 25 MG 24 hr tablet One daily to regulate heart and control BP 12/24/12   Kimber Relic, MD  Multiple Vitamins-Minerals (ICAPS PO) Take 1 tablet by mouth 2 (two) times daily.    Historical Provider, MD  omeprazole (PRILOSEC) 20  MG capsule Take 1 capsule (20 mg total) by mouth daily. 03/24/13   Runell Gess, MD  raloxifene (EVISTA) 60 MG tablet Take 1 tablet (60 mg total) by mouth daily. Generic ok 04/25/13   Kimber Relic, MD    FAMILY HISTORY: Family History  Problem Relation Age of Onset  . CAD Mother   . Cancer Father     colon cancer    SOCIAL HISTORY:  reports that she has never smoked. She has never used smokeless tobacco. She reports that she does not drink alcohol or use illicit drugs.  REVIEW OF SYSTEMS:  Constitutional:   No  weight loss, night sweats,  Fevers, chills, fatigue.  HEENT:    No Difficulty swallowing,Tooth/dental problems,Sore throat,  No sneezing, itching, ear ache, nasal congestion, post nasal drip,   Cardio-vascular: No chest pain,  Orthopnea, PND, swelling in lower extremities, anasarca,   dizziness, palpitations  GI:  No heartburn, indigestion, abdominal pain, nausea, vomiting, diarrhea, change in  bowel habits, loss of appetite  Resp: No shortness of breath with exertion or at rest.  No excess mucus, no productive cough, No non-productive cough,  No coughing up of blood.No change in color of mucus.No wheezing.No chest wall deformity  Skin:  no rash or lesions.  GU:  no dysuria, change in color of urine, no urgency or frequency.  No flank pain.  Musculoskeletal: No joint pain or swelling.  No decreased range of motion.  No back pain.  Psych: No change in mood or affect. No depression or anxiety.  No memory loss.   PHYSICAL EXAM: Blood pressure 165/85, pulse 74, temperature 97.9 F (36.6 C), temperature source Oral, resp. rate 22, height 5\' 2"  (1.575 m), weight 54.432 kg (120 lb), SpO2 97.00%.  General appearance :Awake, alert, not in any distress. Speech Clear. Not toxic Looking HEENT: Atraumatic and Normocephalic, pupils equally reactive to light and accomodation Neck: supple, no JVD. No cervical lymphadenopathy.  Chest:Good air entry bilaterally, no  added sounds  CVS: S1 S2 regular, no murmurs.  Abdomen: Bowel sounds present, Non tender and not distended with no gaurding, rigidity or rebound. Extremities: B/L Lower Ext shows no edema, both legs are warm to touch Neurology: Awake alert, and oriented X 3,  Non focal- 5/5 strength in all 4 extremities the Skin:No Rash Wounds:N/A  LABS ON ADMISSION:   Recent Labs  05/01/13 1342 05/01/13 1357  NA 139 141  K 3.8 3.8  CL 103 103  CO2 28  --   GLUCOSE 153* 155*  BUN 19 20  CREATININE 0.82 0.90  CALCIUM 9.4  --     Recent Labs  05/01/13 1342  AST 24  ALT 18  ALKPHOS 50  BILITOT 0.2*  PROT 7.2  ALBUMIN 3.7   No results found for this basename: LIPASE, AMYLASE,  in the last 72 hours  Recent Labs  05/01/13 1342 05/01/13 1357  WBC 7.1  --   NEUTROABS 4.4  --  HGB 13.4 14.6  HCT 40.2 43.0  MCV 94.6  --   PLT 236  --     Recent Labs  05/01/13 1343  TROPONINI <0.30   No results found for this basename: DDIMER,  in the last 72 hours No components found with this basename: POCBNP,    RADIOLOGIC STUDIES ON ADMISSION: Ct Head Wo Contrast  05/01/2013   CLINICAL DATA:  Weakness  EXAM: CT HEAD WITHOUT CONTRAST  TECHNIQUE: Contiguous axial images were obtained from the base of the skull through the vertex without intravenous contrast.  COMPARISON:  None.  FINDINGS: There is no evidence of mass effect, midline shift or extra-axial fluid collections. There is no evidence of a space-occupying lesion or intracranial hemorrhage. There is no evidence of a cortical-based area of acute infarction. There is generalized cerebral atrophy.  The ventricles and sulci are appropriate for the patient's age. The basal cisterns are patent.  Visualized portions of the orbits are unremarkable. The visualized portions of the paranasal sinuses and mastoid air cells are unremarkable.  The osseous structures are unremarkable.  IMPRESSION: No acute intracranial pathology.   Electronically Signed    By: Elige Ko   On: 05/01/2013 13:48   EKG: Independently reviewed. Normal sinus rhythm.  ASSESSMENT AND PLAN: Present on Admission:  . Stroke - Since she was already on aspirin, we'll stop aspirin and start Plavix. Will check MRI brain, echocardiogram and carotid Doppler. A1c and lipid panel will be checked. Physical therapy evaluation will be ordered. Further plans will be deferred to neurology/stroke team.   . GERD (gastroesophageal reflux disease) - Continue PPI   . Unspecified essential hypertension - BP controlled, continue metoprolol   . Unspecified vitamin D deficiency - Continue vitamin D  Further plan will depend as patient's clinical course evolves and further radiologic and laboratory data become available. Patient will be monitored closely.  Above noted plan was discussed with patient/family , they were in agreement.   DVT Prophylaxis: Prophylactic Lovenox  Code Status: Full Code  Total time spent for admission equals 45 minutes.  [facility] Pager [phone]  If 7PM-7AM, please contact night-coverage [email or web address] Password [email or web address] 05/01/2013, 3:15 PM

## 2013-05-01 NOTE — ED Notes (Signed)
Headache is resolved

## 2013-05-01 NOTE — Progress Notes (Signed)
*   Echocardiogram 2D Echocardiogram has been performed.  Arvil Chaco 05/01/2013, 5:57 PM

## 2013-05-01 NOTE — Progress Notes (Signed)
VASCULAR LAB PRELIMINARY  PRELIMINARY  PRELIMINARY  PRELIMINARY  Carotid duplex completed.    Preliminary report:  Bilateral:  1-39% ICA stenosis.  Vertebral artery flow is antegrade.     Zayaan Kozak, RVS 05/01/2013, 5:32 PM

## 2013-05-02 ENCOUNTER — Inpatient Hospital Stay (HOSPITAL_COMMUNITY): Payer: Medicare Other

## 2013-05-02 DIAGNOSIS — I10 Essential (primary) hypertension: Secondary | ICD-10-CM | POA: Diagnosis not present

## 2013-05-02 DIAGNOSIS — R0989 Other specified symptoms and signs involving the circulatory and respiratory systems: Secondary | ICD-10-CM | POA: Diagnosis not present

## 2013-05-02 DIAGNOSIS — K219 Gastro-esophageal reflux disease without esophagitis: Secondary | ICD-10-CM | POA: Diagnosis not present

## 2013-05-02 DIAGNOSIS — G459 Transient cerebral ischemic attack, unspecified: Principal | ICD-10-CM

## 2013-05-02 DIAGNOSIS — I6350 Cerebral infarction due to unspecified occlusion or stenosis of unspecified cerebral artery: Secondary | ICD-10-CM | POA: Diagnosis not present

## 2013-05-02 LAB — HEMOGLOBIN A1C: Hgb A1c MFr Bld: 5.3 % (ref <5.7)

## 2013-05-02 LAB — LIPID PANEL
Cholesterol: 160 mg/dL (ref 0–200)
HDL: 88 mg/dL (ref >39)
LDL Cholesterol: 59 mg/dL (ref 0–99)
Triglycerides: 63 mg/dL (ref <150)
VLDL: 13 mg/dL (ref 0–40)

## 2013-05-02 MED ORDER — CLOPIDOGREL BISULFATE 75 MG PO TABS: 75.0000 mg | ORAL_TABLET | Freq: Every day | ORAL | Status: DC

## 2013-05-02 NOTE — Progress Notes (Signed)
   CARE MANAGEMENT NOTE 05/02/2013  Patient:  [name], [name]   Account Number:  [id number]  Date Initiated:  05/02/2013  Documentation initiated by:  Jiles Crocker  Subjective/Objective Assessment:   ADMITTED WITH LEFT SIDED NUMBNESS     Action/Plan:   CM FOLLOWING FOR DCP   Anticipated DC Date:  05/06/2013   Anticipated DC Plan:  POSSIBLY HOME W HOME HEALTH SERVICES VS SHORT TERM SNF; AWAITING FOR PT/OT EVALS FOR DISPOSITON/ DISCHARGE NEEDS      DC Planning Services  CM consult         Status of service:  In process, will continue to follow Medicare Important Message given?  NA - LOS <3 / Initial given by admissions (If response is "NO", the following Medicare IM given date fields will be blank)  Per UR Regulation:  Reviewed for med. necessity/level of care/duration of stay  Comments:  12/19/2014Abelino Derrick RN,BSN,MHA [phone]

## 2013-05-02 NOTE — Evaluation (Signed)
Speech Language Pathology Evaluation Patient Details Name: [name] MRN: [id number] DOB: 31-Dec-1928 Today's Date: 05/02/2013 Time: 7829-5621 SLP Time Calculation (min): 15 min  Problem List:  Patient Active Problem List   Diagnosis Date Noted  . Stroke 05/01/2013  . CVA (cerebral infarction) 05/01/2013  . Palpitations 03/24/2013  . GERD (gastroesophageal reflux disease) 03/24/2013  . Unspecified hypothyroidism   . Senile osteoporosis   . Unspecified vitamin D deficiency   . Macular degeneration (senile) of retina, unspecified   . Unspecified glaucoma(365.9)   . Unspecified essential hypertension   . Lumbago   . Reflux esophagitis   . Other and unspecified hyperlipidemia    Past Medical History:  Past Medical History  Diagnosis Date  . Chest pain at rest 01/07/2012  . Pain in joint, hand   . Unspecified hypothyroidism   . Inguinal hernia without mention of obstruction or gangrene, unilateral or unspecified, (not specified as recurrent)   . Senile osteoporosis   . Unspecified vitamin D deficiency   . Female stress incontinence   . Macular degeneration (senile) of retina, unspecified   . Unspecified glaucoma(365.9)   . Osteoarthrosis, unspecified whether generalized or localized, unspecified site   . Unspecified essential hypertension   . Internal hemorrhoids without mention of complication   . Lumbago   . Reflux esophagitis   . Displacement of cervical intervertebral disc without myelopathy   . Other and unspecified hyperlipidemia   . Cervicalgia   . Scoliosis (and kyphoscoliosis), idiopathic   . Diverticulosis of colon (without mention of hemorrhage)   . Palpitations    Past Surgical History:  Past Surgical History  Procedure Laterality Date  . Abdominal hysterectomy    . Tonsillectomy    . Foot surgery  august 2013    Victorino Dike, MD  . Appendectomy  1988  . Eye surgery  2009    cataract removed right eye   HPI:  [name] is an 79 y.o.  female with a Past Medical History of hypertension, gastroesophageal reflux disease, osteoporosis who presented to ED 12/18. Per patient, she was in her usual state of health and was going to church when she started experiencing left facial numbness and left arm tingling/numbness around 11:15 this morning. She denied any weakness, or difficulty in speech. She denied any involvement of the left leg. Since his symptoms are persistent, one of her friends brought her to the emergency room. Code stroke was called, however NIHSS was 1, neurology was consulted, and the hospitalist service was asked to admit this patient for further evaluation and treatment.  Orders receive for SLP to evaluate.     Assessment / Plan / Recommendation Clinical Impression  Speech evaluation completed.  Patient presents with normal sensation, range of motion and strength during oral mech. exam.  Of note, trace lingual tip deviation to the right was observed; however, it did not impact function.  Patient fully intelligible and daughter present to confirm patient at baseline function and as a result, no further skilled SLP services are warranted at this time.      SLP Assessment  Patient does not need any further Speech Lanaguage Pathology Services    Follow Up Recommendations  None            Pertinent Vitals/Pain none    SLP Evaluation Prior Functioning  Cognitive/Linguistic Baseline: Within functional limits Type of Home: House Available Help at Discharge: Available PRN/intermittently;Family Vocation: Retired  Oral / Motor Oral Motor/Sensory Function Overall Oral Motor/Sensory Function: Appears within functional limits for tasks assessed Labial ROM: Within Functional Limits Labial Symmetry: Abnormal symmetry right (trace) Labial Strength: Within Functional Limits Labial Sensation: Within Functional Limits Lingual ROM: Within Functional Limits Lingual Symmetry: Within Functional  Limits Lingual Strength: Within Functional Limits Lingual Sensation: Within Functional Limits Facial ROM: Within Functional Limits Facial Symmetry: Within Functional Limits Facial Strength: Within Functional Limits Facial Sensation: Within Functional Limits Velum: Within Functional Limits Mandible: Within Functional Limits Motor Speech Overall Motor Speech: Appears within functional limits for tasks assessed Respiration: Within functional limits Phonation: Normal Resonance: Within functional limits Articulation: Within functional limitis Intelligibility: Intelligible Motor Planning: Witnin functional limits Motor Speech Errors: Not applicable   GO     Charlane Ferretti., CCC-SLP [phone]  [name] 05/02/2013, 3:24 PM

## 2013-05-02 NOTE — Discharge Summary (Addendum)
Physician Discharge Summary  [name] WUJ:[id number] DOB: 24-Nov-1928 DOA: 05/01/2013  PCP: Kimber Relic, MD  Admit date: 05/01/2013 Discharge date: 05/02/2013  Time spent:  Recommendations for Outpatient Follow-up:  1. D/c home with outpt PCP follow up  Discharge Diagnoses:  Principal Problem:   TIA (transient ischemic attack)  Active Problems:   Unspecified hypothyroidism   Unspecified vitamin D deficiency   Unspecified essential hypertension   GERD (gastroesophageal reflux disease)   Discharge Condition fair  Diet recommendation: Cardiac  Filed Weights   05/01/13 1314 05/01/13 1600  Weight: 54.432 kg (120 lb) 54.432 kg (120 lb)    History of present illness:  [name] is a 79 y.o. female with a Past Medical History of hypertension, gastroesophageal reflux disease, osteoporosis who presents today with the above noted complaint. Per patient, she was in her usual state of health and was going to church and [address] when she started experiencing left facial numbness and left arm tingling/numbness around 11:15 this morning. She denied any weakness, or difficulty in speech. She denied any involvement of the left leg. Since his symptoms are persistent, one of her friends brought her to the emergency room. Code stroke was called, however NIHSS was 1, neurology was consulted, and the hospitalist service was asked to admit this patient for further evaluation and treatment  Patient denied any vomiting, but did think have some nausea. She also claims to have some headache earlier this morning. There is no history of fever, abdominal pain,chest pain.   Hospital Course:  Patient admitted to medical floor on telemetry we've TIA-like symptoms. Admission head CT  was unremarkable. Patient symptoms had resolved by this morning although she complained off and on facial numbness to the neurologist. Since patient was already on aspirin at home she was  switched to Plavix. A 2-D echo was unremarkable except for grade 1 diastolic dysfunction. EF was normal. Carotid Doppler showed bilateral 1-39% ICA stenosis. Her lipid panel and A1c were normal. Neurology recommended following MRI of the brain and if normal can be discharged home on Plavix. Patient's UA did suggest possible UTI however she was asymptomatic and no antibiotic was started.  If MRI of the brain is negative for stroke or any concerning abnormality, she can be discharged home with outpatient PCP followup.  Procedures:  none  Consultations:  Neurology  Discharge Exam: Filed Vitals:   05/02/13 1450  BP: 130/51  Pulse: 61  Temp: 97.8 F (36.6 C)  Resp: 18    General: Elderly female in no acute distress HEENT: No pallor, moist oral mucosa Chest: Clear bilaterally, no added sounds CVS: Normal S1 and S2, no murmurs rub or gallop Abdomen: Soft, nontender, nondistended, bowel sounds present Extremities: Warm, no edema CNS: AAO x3, no focal neurological deficit   Discharge Instructions   Future Appointments Provider Department Dept Phone   06/24/2013 1:30 PM Kimber Relic, MD [facility] [phone]       Medication List    STOP taking these medications       ECOTRIN LOW STRENGTH 81 MG EC tablet  Generic drug:  aspirin      TAKE these medications       bimatoprost 0.01 % Soln  Commonly known as:  LUMIGAN  Place 1 drop into both eyes at bedtime.     calcium carbonate 500 MG chewable tablet  Commonly known as:  TUMS - dosed in mg elemental calcium  Chew 3 tablets by mouth  2 (two) times daily.     clopidogrel 75 MG tablet  Commonly known as:  PLAVIX  Take 1 tablet (75 mg total) by mouth daily with breakfast.     hydrocortisone cream 1 %  Apply 1 application topically as needed for itching.     ICAPS PO  Take 2 capsules by mouth 2 (two) times daily.     metoprolol succinate 25 MG 24 hr tablet  Commonly known as:  TOPROL-XL  One daily to  regulate heart and control BP     omeprazole 20 MG capsule  Commonly known as:  PRILOSEC  Take 1 capsule (20 mg total) by mouth daily.     raloxifene 60 MG tablet  Commonly known as:  EVISTA  Take 1 tablet (60 mg total) by mouth daily. Generic ok     SYSTANE ULTRA PF 0.4-0.3 % Soln  Generic drug:  Polyethyl Glycol-Propyl Glycol  Apply 1 ampule to eye 3 (three) times daily.     Vitamin D3 2000 UNITS Tabs  Take 2,000 Units/day by mouth 2 (two) times daily.       Allergies  Allergen Reactions  . Latex     To face  . Erythromycin   . Macrodantin [Nitrofurantoin Macrocrystal]   . Penicillins   . Septra [Sulfamethoxazole-Tmp Ds]   . Sulfa Antibiotics   . Trimethoprim        Follow-up Information   Follow up with GREEN, Lenon Curt, MD. Schedule an appointment as soon as possible for a visit in 1 week.   Specialty:  Internal Medicine   Contact information:   [address] [place] [address] [phone]        The results of significant diagnostics from this hospitalization (including imaging, microbiology, ancillary and laboratory) are listed below for reference.    Significant Diagnostic Studies: Ct Head Wo Contrast  05/01/2013   CLINICAL DATA:  Weakness  EXAM: CT HEAD WITHOUT CONTRAST  TECHNIQUE: Contiguous axial images were obtained from the base of the skull through the vertex without intravenous contrast.  COMPARISON:  None.  FINDINGS: There is no evidence of mass effect, midline shift or extra-axial fluid collections. There is no evidence of a space-occupying lesion or intracranial hemorrhage. There is no evidence of a cortical-based area of acute infarction. There is generalized cerebral atrophy.  The ventricles and sulci are appropriate for the patient's age. The basal cisterns are patent.  Visualized portions of the orbits are unremarkable. The visualized portions of the paranasal sinuses and mastoid air cells are unremarkable.  The osseous structures are  unremarkable.  IMPRESSION: No acute intracranial pathology.   Electronically Signed   By: Elige Ko   On: 05/01/2013 13:48    Microbiology: No results found for this or any previous visit (from the past 240 hour(s)).   Labs: Basic Metabolic Panel:  Recent Labs Lab 05/01/13 1342 05/01/13 1357  NA 139 141  K 3.8 3.8  CL 103 103  CO2 28  --   GLUCOSE 153* 155*  BUN 19 20  CREATININE 0.82 0.90  CALCIUM 9.4  --    Liver Function Tests:  Recent Labs Lab 05/01/13 1342  AST 24  ALT 18  ALKPHOS 50  BILITOT 0.2*  PROT 7.2  ALBUMIN 3.7   No results found for this basename: LIPASE, AMYLASE,  in the last 168 hours No results found for this basename: AMMONIA,  in the last 168 hours CBC:  Recent Labs Lab 05/01/13 1342 05/01/13 1357  WBC 7.1  --   NEUTROABS 4.4  --   HGB 13.4 14.6  HCT 40.2 43.0  MCV 94.6  --   PLT 236  --    Cardiac Enzymes:  Recent Labs Lab 05/01/13 1343  TROPONINI <0.30   BNP: BNP (last 3 results) No results found for this basename: PROBNP,  in the last 8760 hours CBG:  Recent Labs Lab 05/01/13 1357  GLUCAP 154*       Signed:  Mirage Pfefferkorn  [facility] 05/02/2013, 5:22 PM     Addendum:( 05/03/13) MRI brain was negative for stroke however showed a 3.5 mm left posterior communicating artery aneurysm. Since the size of the aneurysm is quite small with her presenting symptoms unlikely related to the aneurysm would recommend follow up with MRI brain in 1 year .   Recommendation on monitoring borrowed from uptodate: "We suggest that unruptured intracranial aneurysms be monitored with CTA or MRA annually for two to three years, and every two to five years thereafter if the aneurysm is clinically and radiographically stable [37]. However, it is not unreasonable to obtain the first reimaging study of newly detected small aneurysms at six months, since there is evidence that newly formed small aneurysms may be at higher risk  of rupture than older more stable aneurysms (see 'Hypothesis of growth and rupture' above). Longer reimaging intervals are certainly appropriate if the six-month study shows no significant change."

## 2013-05-02 NOTE — Progress Notes (Signed)
Subjective: No acute events overnight.  This morning, the patient initially notes no facial numbness, though upon repeated questioning believes she may still have some mild residual left face numbness.  Objective: Current vital signs: BP 128/52  Pulse 67  Temp(Src) 97.6 F (36.4 C) (Oral)  Resp 18  Ht 5\' 2"  (1.575 m)  Wt 120 lb (54.432 kg)  BMI 21.94 kg/m2  SpO2 99% Vital signs in last 24 hours: Temp:  [97.6 F (36.4 C)-98.9 F (37.2 C)] 97.6 F (36.4 C) (12/19 1028) Pulse Rate:  [63-74] 67 (12/19 1028) Resp:  [18-22] 18 (12/19 1028) BP: (128-165)/(52-85) 128/52 mmHg (12/19 1028) SpO2:  [97 %-99 %] 99 % (12/19 1028) Weight:  [120 lb (54.432 kg)] 120 lb (54.432 kg) (12/18 1600)  Intake/Output from previous day:   Intake/Output this shift:   Nutritional status: Cardiac  Neurologic Exam: Mental Status:  A&O x3, speech fluent, no aphasia.  Pt follows commands without difficulty. Cranial Nerves:  II - visual fields intact, PERRL III/IV/VI - EOMI  V/VII - no facial numbness on initial testing, but on repeat pt notes possible mild left V2 and V3 decreased sensation.  No facial weakness. VIII - hearing intact IX,X - palate elevates bilaterally, normal speech XI: trapezius strength/neck flexion strength normal bilaterally XII: tongue strength normal  Motor: strength 5/5 throughout.  Normal muscle bulk and tone Sensory: intact Deep Tendon Reflexes: @+ bilaterally Cerebellar: Normal finger-to-nose testing.  Carotid auscultation: No bruit  Lab Results: Results for orders placed during the hospital encounter of 05/01/13 (from the past 48 hour(s))  ETHANOL     Status: None   Collection Time    05/01/13  1:42 PM      Result Value Range   Alcohol, Ethyl (B) <11  0 - 11 mg/dL   Comment:            LOWEST DETECTABLE LIMIT FOR     SERUM ALCOHOL IS 11 mg/dL     FOR MEDICAL PURPOSES ONLY  PROTIME-INR     Status: None   Collection Time    05/01/13  1:42 PM      Result Value  Range   Prothrombin Time 12.2  11.6 - 15.2 seconds   INR 0.92  0.00 - 1.49  APTT     Status: None   Collection Time    05/01/13  1:42 PM      Result Value Range   aPTT 33  24 - 37 seconds  CBC     Status: None   Collection Time    05/01/13  1:42 PM      Result Value Range   WBC 7.1  4.0 - 10.5 K/uL   RBC 4.25  3.87 - 5.11 MIL/uL   Hemoglobin 13.4  12.0 - 15.0 g/dL   HCT 16.1  09.6 - 04.5 %   MCV 94.6  78.0 - 100.0 fL   MCH 31.5  26.0 - 34.0 pg   MCHC 33.3  30.0 - 36.0 g/dL   RDW 40.9  81.1 - 91.4 %   Platelets 236  150 - 400 K/uL  DIFFERENTIAL     Status: None   Collection Time    05/01/13  1:42 PM      Result Value Range   Neutrophils Relative % 62  43 - 77 %   Neutro Abs 4.4  1.7 - 7.7 K/uL   Lymphocytes Relative 30  12 - 46 %   Lymphs Abs 2.1  0.7 - 4.0 K/uL  Monocytes Relative 7  3 - 12 %   Monocytes Absolute 0.5  0.1 - 1.0 K/uL   Eosinophils Relative 1  0 - 5 %   Eosinophils Absolute 0.1  0.0 - 0.7 K/uL   Basophils Relative 0  0 - 1 %   Basophils Absolute 0.0  0.0 - 0.1 K/uL  COMPREHENSIVE METABOLIC PANEL     Status: Abnormal   Collection Time    05/01/13  1:42 PM      Result Value Range   Sodium 139  135 - 145 mEq/L   Potassium 3.8  3.5 - 5.1 mEq/L   Chloride 103  96 - 112 mEq/L   CO2 28  19 - 32 mEq/L   Glucose, Bld 153 (*) 70 - 99 mg/dL   BUN 19  6 - 23 mg/dL   Creatinine, Ser 1.61  0.50 - 1.10 mg/dL   Calcium 9.4  8.4 - 09.6 mg/dL   Total Protein 7.2  6.0 - 8.3 g/dL   Albumin 3.7  3.5 - 5.2 g/dL   AST 24  0 - 37 U/L   ALT 18  0 - 35 U/L   Alkaline Phosphatase 50  39 - 117 U/L   Total Bilirubin 0.2 (*) 0.3 - 1.2 mg/dL   GFR calc non Af Amer 65 (*) >90 mL/min   GFR calc Af Amer 75 (*) >90 mL/min   Comment: (NOTE)     The eGFR has been calculated using the CKD EPI equation.     This calculation has not been validated in all clinical situations.     eGFR's persistently <90 mL/min signify possible Chronic Kidney     Disease.  TROPONIN I     Status:  None   Collection Time    05/01/13  1:43 PM      Result Value Range   Troponin I <0.30  <0.30 ng/mL   Comment:            Due to the release kinetics of cTnI,     a negative result within the first hours     of the onset of symptoms does not rule out     myocardial infarction with certainty.     If myocardial infarction is still suspected,     repeat the test at appropriate intervals.  URINE RAPID DRUG SCREEN (HOSP PERFORMED)     Status: None   Collection Time    05/01/13  1:53 PM      Result Value Range   Opiates NONE DETECTED  NONE DETECTED   Cocaine NONE DETECTED  NONE DETECTED   Benzodiazepines NONE DETECTED  NONE DETECTED   Amphetamines NONE DETECTED  NONE DETECTED   Tetrahydrocannabinol NONE DETECTED  NONE DETECTED   Barbiturates NONE DETECTED  NONE DETECTED   Comment:            DRUG SCREEN FOR MEDICAL PURPOSES     ONLY.  IF CONFIRMATION IS NEEDED     FOR ANY PURPOSE, NOTIFY LAB     WITHIN 5 DAYS.                LOWEST DETECTABLE LIMITS     FOR URINE DRUG SCREEN     Drug Class       Cutoff (ng/mL)     Amphetamine      1000     Barbiturate      200     Benzodiazepine   200     Tricyclics  300     Opiates          300     Cocaine          300     THC              50  URINALYSIS, ROUTINE W REFLEX MICROSCOPIC     Status: Abnormal   Collection Time    05/01/13  1:53 PM      Result Value Range   Color, Urine YELLOW  YELLOW   APPearance CLEAR  CLEAR   Specific Gravity, Urine 1.010  1.005 - 1.030   pH 6.0  5.0 - 8.0   Glucose, UA NEGATIVE  NEGATIVE mg/dL   Hgb urine dipstick NEGATIVE  NEGATIVE   Bilirubin Urine NEGATIVE  NEGATIVE   Ketones, ur NEGATIVE  NEGATIVE mg/dL   Protein, ur NEGATIVE  NEGATIVE mg/dL   Urobilinogen, UA 0.2  0.0 - 1.0 mg/dL   Nitrite NEGATIVE  NEGATIVE   Leukocytes, UA TRACE (*) NEGATIVE  URINE MICROSCOPIC-ADD ON     Status: Abnormal   Collection Time    05/01/13  1:53 PM      Result Value Range   Squamous Epithelial / LPF FEW (*)  RARE   WBC, UA 0-2  <3 WBC/hpf   Bacteria, UA FEW (*) RARE   Urine-Other AMORPHOUS URATES/PHOSPHATES    GLUCOSE, CAPILLARY     Status: Abnormal   Collection Time    05/01/13  1:57 PM      Result Value Range   Glucose-Capillary 154 (*) 70 - 99 mg/dL  POCT I-STAT, CHEM 8     Status: Abnormal   Collection Time    05/01/13  1:57 PM      Result Value Range   Sodium 141  135 - 145 mEq/L   Potassium 3.8  3.5 - 5.1 mEq/L   Chloride 103  96 - 112 mEq/L   BUN 20  6 - 23 mg/dL   Creatinine, Ser 1.61  0.50 - 1.10 mg/dL   Glucose, Bld 096 (*) 70 - 99 mg/dL   Calcium, Ion 0.45  4.09 - 1.30 mmol/L   TCO2 26  0 - 100 mmol/L   Hemoglobin 14.6  12.0 - 15.0 g/dL   HCT 81.1  91.4 - 78.2 %  LIPID PANEL     Status: None   Collection Time    05/02/13  6:33 AM      Result Value Range   Cholesterol 160  0 - 200 mg/dL   Triglycerides 63  <956 mg/dL   HDL 88  >21 mg/dL   Total CHOL/HDL Ratio 1.8     VLDL 13  0 - 40 mg/dL   LDL Cholesterol 59  0 - 99 mg/dL   Comment:            Total Cholesterol/HDL:CHD Risk     Coronary Heart Disease Risk Table                         Men   Women      1/2 Average Risk   3.4   3.3      Average Risk       5.0   4.4      2 X Average Risk   9.6   7.1      3 X Average Risk  23.4   11.0  Use the calculated Patient Ratio     above and the CHD Risk Table     to determine the patient's CHD Risk.                ATP III CLASSIFICATION (LDL):      <100     mg/dL   Optimal      191-478  mg/dL   Near or Above                        Optimal      130-159  mg/dL   Borderline      295-621  mg/dL   High      >308     mg/dL   Very High    No results found for this or any previous visit (from the past 240 hour(s)).  Lipid Panel  Recent Labs  05/02/13 0633  CHOL 160  TRIG 63  HDL 88  CHOLHDL 1.8  VLDL 13  LDLCALC 59    Studies/Results: Ct Head Wo Contrast  05/01/2013   CLINICAL DATA:  Weakness  EXAM: CT HEAD WITHOUT CONTRAST  TECHNIQUE:  Contiguous axial images were obtained from the base of the skull through the vertex without intravenous contrast.  COMPARISON:  None.  FINDINGS: There is no evidence of mass effect, midline shift or extra-axial fluid collections. There is no evidence of a space-occupying lesion or intracranial hemorrhage. There is no evidence of a cortical-based area of acute infarction. There is generalized cerebral atrophy.  The ventricles and sulci are appropriate for the patient's age. The basal cisterns are patent.  Visualized portions of the orbits are unremarkable. The visualized portions of the paranasal sinuses and mastoid air cells are unremarkable.  The osseous structures are unremarkable.  IMPRESSION: No acute intracranial pathology.   Electronically Signed   By: Elige Ko   On: 05/01/2013 13:48    Medications: I have reviewed the patient's current medications. Scheduled Meds: . calcium carbonate  3 tablet Oral BID  . clopidogrel  75 mg Oral Q breakfast  . enoxaparin (LOVENOX) injection  40 mg Subcutaneous Q24H  . latanoprost  1 drop Both Eyes QHS  . metoprolol succinate  25 mg Oral Daily  . multivitamin-lutein  1 capsule Oral Daily  . pantoprazole  40 mg Oral Daily  . polyethylene glycol  17 g Oral Daily  . polyvinyl alcohol  1 drop Both Eyes TID  . raloxifene  60 mg Oral Daily   Continuous Infusions:  PRN Meds:.acetaminophen, hydrocortisone cream, ondansetron (ZOFRAN) IV  Assessment/Plan: The patient is an 79 yo woman, history of HTN, HL, hypothyroidism, presenting with left face and arm numbness.  Symptoms have significantly improved, though possibly still with some residual left face numbness.  Echo and dopplers normal.  LDL = 59.   PT states no follow-up needed.   Recommendations: 1.  Will follow up MRI results 2. Continue plavix for secondary stroke prevention 3.  After MRI no further neurologic intervention is recommended at this time.  If further questions arise, please call or page at  that time.  Thank you for allowing neurology to participate in the care of this patient.  Patient to follow up as an outpatient.    Patient seen and examined.  Clinical course and management discussed.  Necessary edits performed.  I agree with the above.  Assessment and plan of care developed.    Thana Farr, MD [facility] [phone]  05/02/2013  4:02  PM      LOS: 1 day   Janalyn Harder, PGY3 Pgr. [phone]  05/02/2013  11:54 AM

## 2013-05-02 NOTE — Progress Notes (Signed)
Occupational Therapy Discharge Patient Details Name: Bellarae Lizer MRN: [id number] DOB: [birth year] Today's Date: 05/02/2013 Time:  -     Patient discharged from OT services secondary to Screen by OT. OT spoke to PT [facility] directly and no acute care needs..  Please see latest therapy progress note for current level of functioning and progress toward goals.    Progress and discharge plan discussed with patient and/or caregiver: Patient/Caregiver agrees with plan  GO     Harolyn Rutherford Pager: [phone]  05/02/2013, 9:35 AM

## 2013-05-02 NOTE — Evaluation (Signed)
Physical Therapy Evaluation Patient Details Name: [name] MRN: [id number] DOB: May 04, 1929 Today's Date: 05/02/2013 Time: 4098-1191 PT Time Calculation (min): 17 min  PT Assessment / Plan / Recommendation History of Present Illness  pt presents with L facial and UE numbness.  work-up underway for CVA.    Clinical Impression  Pt very motivated and moving well.  Pt states she feels back to baseline and demos good safety with mobility.  No further PT needs at this time.  Will sign off.      PT Assessment  Patent does not need any further PT services    Follow Up Recommendations  No PT follow up    Does the patient have the potential to tolerate intense rehabilitation      Barriers to Discharge        Equipment Recommendations  None recommended by PT    Recommendations for Other Services     Frequency      Precautions / Restrictions Precautions Precautions: None Restrictions Weight Bearing Restrictions: No   Pertinent Vitals/Pain Denied.        Mobility  Bed Mobility Bed Mobility: Supine to Sit;Sitting - Scoot to Edge of Bed Supine to Sit: 6: Modified independent (Device/Increase time) Sitting - Scoot to Edge of Bed: 6: Modified independent (Device/Increase time) Details for Bed Mobility Assistance: pt needed increased time, but able to complete without A.   Transfers Transfers: Sit to Stand;Stand to Sit Sit to Stand: 6: Modified independent (Device/Increase time);With upper extremity assist;From bed;From toilet Stand to Sit: 6: Modified independent (Device/Increase time);With upper extremity assist;To toilet;To chair/3-in-1 Details for Transfer Assistance: Utilizes UEs to complete transfers.   Ambulation/Gait Ambulation/Gait Assistance: 6: Modified independent (Device/Increase time) Ambulation Distance (Feet): 300 Feet Assistive device: None Ambulation/Gait Assistance Details: pt ambulates cautiously and states she nromally wears her bedroom shoes and  doesn't walk in just socks.  Otherwise pt is moving well.   Gait Pattern: Step-through pattern;Decreased stride length Stairs: Yes Stairs Assistance: 6: Modified independent (Device/Increase time) Stair Management Technique: One rail Right;Step to pattern;Forwards Number of Stairs: 4 Wheelchair Mobility Wheelchair Mobility: No Modified Rankin (Stroke Patients Only) Pre-Morbid Rankin Score: No symptoms Modified Rankin: No significant disability    Exercises     PT Diagnosis:    PT Problem List:   PT Treatment Interventions:       PT Goals(Current goals can be found in the care plan section)    Visit Information  Last PT Received On: 05/02/13 Assistance Needed: +1 History of Present Illness: pt presents with L facial and UE numbness.  work-up underway for CVA.         Prior Functioning  Home Living Family/patient expects to be discharged to:: Private residence Living Arrangements: Children Available Help at Discharge: Available PRN/intermittently Type of Home: House Home Access: Stairs to enter [employer] of Steps: 2 Entrance Stairs-Rails: Right Home Layout: One level Home Equipment: None Additional Comments: pt's daughter works during the day.   Prior Function Level of Independence: Independent Comments: pt performs most homemaking and grocery shopping for her and daughter.   Communication Communication: No difficulties    Cognition  Cognition Arousal/Alertness: Awake/alert Behavior During Therapy: WFL for tasks assessed/performed Overall Cognitive Status: Within Functional Limits for tasks assessed    Extremity/Trunk Assessment Upper Extremity Assessment Upper Extremity Assessment: Defer to OT evaluation Lower Extremity Assessment Lower Extremity Assessment: Overall WFL for tasks assessed   Balance Balance Balance Assessed: Yes High Level Balance High Level Balance Activites: Backward  walking;Head turns;Turns  End of Session PT - End of  Session Equipment Utilized During Treatment: Gait belt Activity Tolerance: Patient tolerated treatment well Patient left: in chair;with call bell/phone within reach Nurse Communication: Mobility status (Medication questions.  )  GP     Sunny Schlein, PT [phone] 05/02/2013, 8:59 AM

## 2013-05-05 ENCOUNTER — Other Ambulatory Visit: Payer: Self-pay | Admitting: *Deleted

## 2013-05-05 MED ORDER — PANTOPRAZOLE SODIUM 40 MG PO TBEC: 40.0000 mg | DELAYED_RELEASE_TABLET | Freq: Every day | ORAL | Status: DC

## 2013-05-05 NOTE — Telephone Encounter (Signed)
Rx was sent to pharmacy electronically. 

## 2013-05-16 ENCOUNTER — Encounter: Payer: Self-pay | Admitting: *Deleted

## 2013-05-20 ENCOUNTER — Ambulatory Visit (INDEPENDENT_AMBULATORY_CARE_PROVIDER_SITE_OTHER): Payer: Medicare Other | Admitting: Internal Medicine

## 2013-05-20 ENCOUNTER — Encounter: Payer: Self-pay | Admitting: Internal Medicine

## 2013-05-20 VITALS — BP 126/78 | HR 66 | Temp 96.9°F | Wt 124.4 lb

## 2013-05-20 DIAGNOSIS — I10 Essential (primary) hypertension: Secondary | ICD-10-CM | POA: Diagnosis not present

## 2013-05-20 DIAGNOSIS — I671 Cerebral aneurysm, nonruptured: Secondary | ICD-10-CM

## 2013-05-20 DIAGNOSIS — K2100 Gastro-esophageal reflux disease with esophagitis, without bleeding: Secondary | ICD-10-CM

## 2013-05-20 DIAGNOSIS — G459 Transient cerebral ischemic attack, unspecified: Secondary | ICD-10-CM | POA: Diagnosis not present

## 2013-05-20 MED ORDER — CLOPIDOGREL BISULFATE 75 MG PO TABS: ORAL_TABLET | ORAL | Status: DC

## 2013-05-20 MED ORDER — PANTOPRAZOLE SODIUM 40 MG PO TBEC: DELAYED_RELEASE_TABLET | ORAL | Status: DC

## 2013-05-20 NOTE — Progress Notes (Signed)
Patient ID: [name], female   DOB: 08/09/1928, 80 y.o.   MRN: [id number]    Location:    [facility]  [name]:   OFFICE    Allergies  Allergen Reactions  . Latex     To face  . Erythromycin   . Macrodantin [Nitrofurantoin Macrocrystal]   . Penicillins   . Septra [Sulfamethoxazole-Tmp Ds]   . Sulfa Antibiotics   . Trimethoprim     Chief Complaint  Patient presents with  . Hospitalization Follow-up    Seen in hospital for TIA   . Medication Management    Discuss Plavix, if patient is to continue she will need a NEW rx     HPI:  CVA on 05/01/13. Initially had left facial weakness and left side weakness. She was symptomatic for several hours. She believes she has a full recovery now. ASA  Was stopped and Plavix started.  Unspecified essential hypertension: controlled  Reflux esophagitis: controlled on Protonix  Cerebral aneurysm: 3.5 cm left posterior communicating artery aneurysm   Medications: Patient's Medications  New Prescriptions   No medications on file  Previous Medications   BIMATOPROST (LUMIGAN) 0.01 % SOLN    Place 1 drop into both eyes at bedtime.   CALCIUM CARBONATE (TUMS - DOSED IN MG ELEMENTAL CALCIUM) 500 MG CHEWABLE TABLET    Chew 3 tablets by mouth 2 (two) times daily.   CHOLECALCIFEROL (VITAMIN D3) 2000 UNITS TABS    Take 2,000 Units/day by mouth 2 (two) times daily.   CLOPIDOGREL (PLAVIX) 75 MG TABLET    Take 1 tablet (75 mg total) by mouth daily with breakfast.   HYDROCORTISONE CREAM 1 %    Apply 1 application topically as needed for itching.   METOPROLOL SUCCINATE (TOPROL-XL) 25 MG 24 HR TABLET    One daily to regulate heart and control BP   MULTIPLE VITAMINS-MINERALS (ICAPS PO)    Take 2 capsules by mouth 2 (two) times daily.   PANTOPRAZOLE (PROTONIX) 40 MG TABLET    Take 1 tablet (40 mg total) by mouth daily.   POLYETHYL GLYCOL-PROPYL GLYCOL (SYSTANE ULTRA PF) 0.4-0.3 % SOLN    Apply 1 ampule to eye 3 (three) times daily.   RALOXIFENE (EVISTA) 60 MG TABLET    Take 1 tablet (60 mg total) by mouth daily. Generic ok  Modified Medications   No medications on file  Discontinued Medications   No medications on file     Review of Systems  Constitutional: Negative.   HENT: Negative.   Eyes:       Left cataract.  Respiratory: Negative.   Cardiovascular: Negative.   Gastrointestinal: Negative.   Endocrine: Negative.   Genitourinary:       Stress incontinence.  Musculoskeletal:       Some joint pains.  Skin: Negative.   Allergic/Immunologic: Negative.   Neurological: Negative.   Hematological: Negative.   Psychiatric/Behavioral: Negative.     Filed Vitals:   05/20/13 1420  BP: 126/78  Pulse: 66  Temp: 96.9 F (36.1 C)  TempSrc: Oral  Weight: 124 lb 6.4 oz (56.427 kg)  SpO2: 95%   Physical Exam  Constitutional: She is oriented to person, place, and time. She appears well-developed and well-nourished. No distress.  HENT:  Head: Normocephalic and atraumatic.  Right Ear: External ear normal.  Left Ear: External ear normal.  Nose: Nose normal.  Mouth/Throat: Oropharynx is clear and moist.  Eyes: Conjunctivae are normal. Pupils are equal, round, and reactive to light.  Left eye exhibits no discharge.  Neck: No JVD present. No tracheal deviation present. No thyromegaly present.  Cardiovascular: Normal rate, regular rhythm, normal heart sounds and intact distal pulses.  Exam reveals no gallop and no friction rub.   No murmur heard. Pulmonary/Chest: No respiratory distress. She has no wheezes. She has no rales. She exhibits no tenderness.  Abdominal: She exhibits no distension and no mass. There is no tenderness.  Musculoskeletal: She exhibits no edema and no tenderness.  Lymphadenopathy:    She has no cervical adenopathy.  Neurological: She is alert and oriented to person, place, and time. She has normal reflexes. No cranial nerve deficit. Coordination normal.  Skin: No rash noted. No erythema. No  pallor.  Psychiatric: She has a normal mood and affect. Her behavior is normal. Judgment and thought content normal.     Labs reviewed: Abstract on 05/16/2013  Component Date Value Range Status  . HM Mammogram 04/07/2013 NL   Final  . HM Colonoscopy 09/24/2006 Diverticulosis/family Hx Colon Neoplasia, repeat 2013   Final  . HM Dexa Scan 12/02/2010 [facility], Osteopenia   Final  Admission on 05/01/2013, Discharged on 05/02/2013  Component Date Value Range Status  . Alcohol, Ethyl (B) 05/01/2013 <11  0 - 11 mg/dL Final   Comment:                                 LOWEST DETECTABLE LIMIT FOR                          SERUM ALCOHOL IS 11 mg/dL                          FOR MEDICAL PURPOSES ONLY  . Prothrombin Time 05/01/2013 12.2  11.6 - 15.2 seconds Final  . INR 05/01/2013 0.92  0.00 - 1.49 Final  . aPTT 05/01/2013 33  24 - 37 seconds Final  . WBC 05/01/2013 7.1  4.0 - 10.5 K/uL Final  . RBC 05/01/2013 4.25  3.87 - 5.11 MIL/uL Final  . Hemoglobin 05/01/2013 13.4  12.0 - 15.0 g/dL Final  . HCT 05/01/2013 40.2  36.0 - 46.0 % Final  . MCV 05/01/2013 94.6  78.0 - 100.0 fL Final  . MCH 05/01/2013 31.5  26.0 - 34.0 pg Final  . MCHC 05/01/2013 33.3  30.0 - 36.0 g/dL Final  . RDW 05/01/2013 14.6  11.5 - 15.5 % Final  . Platelets 05/01/2013 236  150 - 400 K/uL Final  . Neutrophils Relative % 05/01/2013 62  43 - 77 % Final  . Neutro Abs 05/01/2013 4.4  1.7 - 7.7 K/uL Final  . Lymphocytes Relative 05/01/2013 30  12 - 46 % Final  . Lymphs Abs 05/01/2013 2.1  0.7 - 4.0 K/uL Final  . Monocytes Relative 05/01/2013 7  3 - 12 % Final  . Monocytes Absolute 05/01/2013 0.5  0.1 - 1.0 K/uL Final  . Eosinophils Relative 05/01/2013 1  0 - 5 % Final  . Eosinophils Absolute 05/01/2013 0.1  0.0 - 0.7 K/uL Final  . Basophils Relative 05/01/2013 0  0 - 1 % Final  . Basophils Absolute 05/01/2013 0.0  0.0 - 0.1 K/uL Final  . Sodium 05/01/2013 139  135 - 145 mEq/L Final  . Potassium 05/01/2013 3.8  3.5  - 5.1 mEq/L Final  . Chloride 05/01/2013 103  96 -  112 mEq/L Final  . CO2 05/01/2013 28  19 - 32 mEq/L Final  . Glucose, Bld 05/01/2013 153* 70 - 99 mg/dL Final  . BUN 05/01/2013 19  6 - 23 mg/dL Final  . Creatinine, Ser 05/01/2013 0.82  0.50 - 1.10 mg/dL Final  . Calcium 05/01/2013 9.4  8.4 - 10.5 mg/dL Final  . Total Protein 05/01/2013 7.2  6.0 - 8.3 g/dL Final  . Albumin 05/01/2013 3.7  3.5 - 5.2 g/dL Final  . AST 05/01/2013 24  0 - 37 U/L Final  . ALT 05/01/2013 18  0 - 35 U/L Final  . Alkaline Phosphatase 05/01/2013 50  39 - 117 U/L Final  . Total Bilirubin 05/01/2013 0.2* 0.3 - 1.2 mg/dL Final  . GFR calc non Af Amer 05/01/2013 65* >90 mL/min Final  . GFR calc Af Amer 05/01/2013 75* >90 mL/min Final   Comment: (NOTE)                          The eGFR has been calculated using the CKD EPI equation.                          This calculation has not been validated in all clinical situations.                          eGFR's persistently <90 mL/min signify possible Chronic Kidney                          Disease.  . Troponin I 05/01/2013 <0.30  <0.30 ng/mL Final   Comment:                                 Due to the release kinetics of cTnI,                          a negative result within the first hours                          of the onset of symptoms does not rule out                          myocardial infarction with certainty.                          If myocardial infarction is still suspected,                          repeat the test at appropriate intervals.  . Opiates 05/01/2013 NONE DETECTED  NONE DETECTED Final  . Cocaine 05/01/2013 NONE DETECTED  NONE DETECTED Final  . Benzodiazepines 05/01/2013 NONE DETECTED  NONE DETECTED Final  . Amphetamines 05/01/2013 NONE DETECTED  NONE DETECTED Final  . Tetrahydrocannabinol 05/01/2013 NONE DETECTED  NONE DETECTED Final  . Barbiturates 05/01/2013 NONE DETECTED  NONE DETECTED Final   Comment:                                 DRUG  SCREEN FOR MEDICAL PURPOSES  ONLY.  IF CONFIRMATION IS NEEDED                          FOR ANY PURPOSE, NOTIFY LAB                          WITHIN 5 DAYS.                                                          LOWEST DETECTABLE LIMITS                          FOR URINE DRUG SCREEN                          Drug Class       Cutoff (ng/mL)                          Amphetamine      1000                          Barbiturate      200                          Benzodiazepine   200                          Tricyclics       245                          Opiates          300                          Cocaine          300                          THC              50  . Color, Urine 05/01/2013 YELLOW  YELLOW Final  . APPearance 05/01/2013 CLEAR  CLEAR Final  . Specific Gravity, Urine 05/01/2013 1.010  1.005 - 1.030 Final  . pH 05/01/2013 6.0  5.0 - 8.0 Final  . Glucose, UA 05/01/2013 NEGATIVE  NEGATIVE mg/dL Final  . Hgb urine dipstick 05/01/2013 NEGATIVE  NEGATIVE Final  . Bilirubin Urine 05/01/2013 NEGATIVE  NEGATIVE Final  . Ketones, ur 05/01/2013 NEGATIVE  NEGATIVE mg/dL Final  . Protein, ur 05/01/2013 NEGATIVE  NEGATIVE mg/dL Final  . Urobilinogen, UA 05/01/2013 0.2  0.0 - 1.0 mg/dL Final  . Nitrite 05/01/2013 NEGATIVE  NEGATIVE Final  . Leukocytes, UA 05/01/2013 TRACE* NEGATIVE Final  . Glucose-Capillary 05/01/2013 154* 70 - 99 mg/dL Final  . Sodium 05/01/2013 141  135 - 145 mEq/L Final  . Potassium 05/01/2013 3.8  3.5 - 5.1 mEq/L Final  . Chloride 05/01/2013 103  96 - 112 mEq/L Final  . BUN 05/01/2013 20  6 - 23 mg/dL Final  . Creatinine, Ser  05/01/2013 0.90  0.50 - 1.10 mg/dL Final  . Glucose, Bld 05/01/2013 155* 70 - 99 mg/dL Final  . Calcium, Ion 05/01/2013 1.24  1.13 - 1.30 mmol/L Final  . TCO2 05/01/2013 26  0 - 100 mmol/L Final  . Hemoglobin 05/01/2013 14.6  12.0 - 15.0 g/dL Final  . HCT 05/01/2013 43.0  36.0 - 46.0 % Final  . Squamous Epithelial / LPF  05/01/2013 FEW* RARE Final  . WBC, UA 05/01/2013 0-2  <3 WBC/hpf Final  . Bacteria, UA 05/01/2013 FEW* RARE Final  . Urine-Other 05/01/2013 AMORPHOUS URATES/PHOSPHATES   Final  . Hemoglobin A1C 05/02/2013 5.3  <5.7 % Final   Comment: (NOTE)                                                                                                                         According to the ADA Clinical Practice Recommendations for 2011, when                          HbA1c is used as a screening test:                           >=6.5%   Diagnostic of Diabetes Mellitus                                    (if abnormal result is confirmed)                          5.7-6.4%   Increased risk of developing Diabetes Mellitus                          References:Diagnosis and Classification of Diabetes Mellitus,Diabetes                          CZYS,0630,16(WFUXN 1):S62-S69 and Standards of Medical Care in                                  Diabetes - 2011,Diabetes Care,2011,34 (Suppl 1):S11-S61.  . Mean Plasma Glucose 05/02/2013 105  <117 mg/dL Final   Performed at [facility]  . Cholesterol 05/02/2013 160  0 - 200 mg/dL Final  . Triglycerides 05/02/2013 63  <150 mg/dL Final  . HDL 05/02/2013 88  >39 mg/dL Final  . Total CHOL/HDL Ratio 05/02/2013 1.8   Final  . VLDL 05/02/2013 13  0 - 40 mg/dL Final  . LDL Cholesterol 05/02/2013 59  0 - 99 mg/dL Final   Comment:  Total Cholesterol/HDL:CHD Risk                          Coronary Heart Disease Risk Table                                              Men   Women                           1/2 Average Risk   3.4   3.3                           Average Risk       5.0   4.4                           2 X Average Risk   9.6   7.1                           3 X Average Risk  23.4   11.0                                                          Use the calculated Patient Ratio                          above and the CHD Risk Table                           to determine the patient's CHD Risk.                                                          ATP III CLASSIFICATION (LDL):                           <100     mg/dL   Optimal                           100-129  mg/dL   Near or Above                                             Optimal                           130-159  mg/dL   Borderline                           160-189  mg/dL   High                           >  190     mg/dL   Very High      Assessment/Plan  TIA (transient ischemic attack) - Plan: clopidogrel (PLAVIX) 75 MG tablet  Unspecified essential hypertension: controlled  Reflux esophagitis - Plan: pantoprazole (PROTONIX) 40 MG tablet  Cerebral aneurysm: continue follow up with neurology

## 2013-05-20 NOTE — Patient Instructions (Addendum)
Continue current medication. You are being referred to a neurologist for review of the finding of a cerebral artery aneurysm.

## 2013-05-27 ENCOUNTER — Ambulatory Visit (INDEPENDENT_AMBULATORY_CARE_PROVIDER_SITE_OTHER): Payer: Medicare Other | Admitting: Neurology

## 2013-05-27 ENCOUNTER — Encounter: Payer: Self-pay | Admitting: Neurology

## 2013-05-27 VITALS — BP 146/75 | HR 74 | Ht 62.0 in | Wt 124.0 lb

## 2013-05-27 DIAGNOSIS — I671 Cerebral aneurysm, nonruptured: Secondary | ICD-10-CM

## 2013-05-27 NOTE — Patient Instructions (Signed)
[email or web address]

## 2013-05-27 NOTE — Progress Notes (Addendum)
[facility]  PATIENT: Kendia Pancoast DOB: 1928/10/12  HISTORICAL  Camri is a pleasant 80 years old right-handed Caucasian female, accompanied by her daughter, referred by her primary care physician Dr. Sherald Hess for evaluation of possible left posterior cerebral artery aneurysm   She had a past medical history of hypertension, well controlled by low-dose metoprolol, still active, exercises regularly, taking baby aspirin daily   In December eighteenth 2013, while driving, she presented with acute onset of left facial, left arm numbness, lasting about 2 hours, presented to the emergency room, was diagnosed with TIA, she was switched from aspirin to Plavix, she denies weakness, no dysarthria at that time,  MRI of the brain has showed moderate atrophy, and moderate periventricular small vessel disease, but there was no acute stroke. MRA of the brain showed no large vessel disease, there was a possible left posterior cerebral artery aneurysm vs. branching, 3 point 5 mm, we have reviewed film together   She is normal back to her baseline, very active, driving, exercise 3 times a week,   She denies a previous history of headaches, now presenting with intermittent few seconds left frontal area sharp headaches occasionally  REVIEW OF SYSTEMS: Full 14 system review of systems performed and notable only for easy bruising, incontinence, joint pain, itching.  ALLERGIES: Allergies  Allergen Reactions  . Latex     To face  . Erythromycin   . Macrodantin [Nitrofurantoin Macrocrystal]   . Penicillins   . Septra [Sulfamethoxazole-Tmp Ds]   . Sulfa Antibiotics   . Trimethoprim     HOME MEDICATIONS: Outpatient Prescriptions Prior to Visit  Medication Sig Dispense Refill  . bimatoprost (LUMIGAN) 0.01 % SOLN Place 1 drop into both eyes at bedtime.      . calcium carbonate (TUMS - DOSED IN MG ELEMENTAL CALCIUM) 500 MG chewable tablet Chew 3 tablets by mouth 2 (two)  times daily.      . Cholecalciferol (VITAMIN D3) 2000 UNITS TABS Take 2,000 Units/day by mouth 2 (two) times daily.      . clopidogrel (PLAVIX) 75 MG tablet One daily at breakfast to prevent stroke  90 tablet  4  . hydrocortisone cream 1 % Apply 1 application topically as needed for itching.      . metoprolol succinate (TOPROL-XL) 25 MG 24 hr tablet One daily to regulate heart and control BP  90 tablet  3  . Multiple Vitamins-Minerals (ICAPS PO) Take 2 capsules by mouth 2 (two) times daily.      . pantoprazole (PROTONIX) 40 MG tablet One daily to reduce stomach acid and protect the esophagus  90 tablet  4  . Polyethyl Glycol-Propyl Glycol (SYSTANE ULTRA PF) 0.4-0.3 % SOLN Apply 1 ampule to eye 3 (three) times daily.      . raloxifene (EVISTA) 60 MG tablet Take 1 tablet (60 mg total) by mouth daily. Generic ok  90 tablet  3    PAST MEDICAL HISTORY: Past Medical History  Diagnosis Date  . Chest pain at rest 01/07/2012  . Pain in joint, hand   . Unspecified hypothyroidism   . Inguinal hernia without mention of obstruction or gangrene, unilateral or unspecified, (not specified as recurrent)   . Senile osteoporosis   . Unspecified vitamin D deficiency   . Female stress incontinence   . Macular degeneration (senile) of retina, unspecified   . Unspecified glaucoma   . Osteoarthrosis, unspecified whether generalized or localized, unspecified site   . Unspecified essential hypertension   .  Internal hemorrhoids without mention of complication   . Lumbago   . Reflux esophagitis   . Displacement of cervical intervertebral disc without myelopathy   . Other and unspecified hyperlipidemia   . Cervicalgia   . Scoliosis (and kyphoscoliosis), idiopathic   . Diverticulosis of colon (without mention of hemorrhage)   . Palpitations     PAST SURGICAL HISTORY: Past Surgical History  Procedure Laterality Date  . Tonsillectomy  1937  . Foot surgery  august 2013    Doran Durand, MD  . Abdominal hysterectomy   1975    Dr Mallie Mussel  . Appendectomy  1988  . Eye surgery Bilateral 2009    cataract removed right eye, Dr Charise Killian    FAMILY HISTORY: Family History  Problem Relation Age of Onset  . CAD Mother   . Parkinson's disease Mother   . Cancer Father     colon cancer  . Parkinson's disease Father   . Stroke Maternal Grandmother     SOCIAL HISTORY:  History   Social History  . Marital Status: Widowed    Spouse Name: N/A    Number of Children: 2  . Years of Education: college   Occupational History    retired   Social History Main Topics  . Smoking status: Never Smoker   . Smokeless tobacco: Never Used  . Alcohol Use: No  . Drug Use: No  . Sexual Activity: Not on file   Other Topics Concern  . Not on file   Social History Narrative   Patient lives at home with her daughter [name]) - Widowed.   Retired.   EducationNurse, mental health.   Right handed.   Caffeine- None some times tea very rare.    PHYSICAL EXAM   Filed Vitals:   05/27/13 0901  BP: 146/75  Pulse: 74  Height: 5\' 2"  (1.575 m)  Weight: 124 lb (56.246 kg)    Not recorded    Body mass index is 22.67 kg/(m^2).   Generalized: In no acute distress  Neck: Supple, no carotid bruits   Cardiac: Regular rate rhythm  Pulmonary: Clear to auscultation bilaterally  Musculoskeletal: No deformity  Neurological examination  Mentation: Alert oriented to time, place, history taking, and causual conversation  Cranial nerve II-XII: Pupils were equal round reactive to light extraocular movements were full, Visual field were full on confrontational test. Bilateral fundi were sharp.  Facial sensation and strength were normal. Hearing was intact to finger rubbing bilaterally. Uvula tongue midline.  head turning and shoulder shrug and were normal and symmetric.Tongue protrusion into cheek strength was normal.  Motor: normal tone, bulk and strength.  Sensory: Intact to fine touch, pinprick, preserved vibratory sensation, and  proprioception at toes.  Coordination: Normal finger to nose, heel-to-shin bilaterally there was no truncal ataxia  Gait: Rising up from seated position without assistance, normal stance, without trunk ataxia, moderate stride, good arm swing, smooth turning,she has mild difficulty with tiptoe, heel walking, which is age appropriate   Deep tendon reflexes, brachial radialis 2/2, biceps 2/2, triceps 2/2, patellar 2/2, Achilles 2/2, plantar responses were flexor bilaterally.   DIAGNOSTIC DATA (LABS, IMAGING, TESTING) - I reviewed patient records, labs, notes, testing and imaging myself where available.  Lab Results  Component Value Date   WBC 7.1 05/01/2013   HGB 14.6 05/01/2013   HCT 43.0 05/01/2013   MCV 94.6 05/01/2013   PLT 236 05/01/2013      Component Value Date/Time   NA 141 05/01/2013 1357   NA 139 12/20/2012  0945   K 3.8 05/01/2013 1357   CL 103 05/01/2013 1357   CO2 28 05/01/2013 1342   GLUCOSE 155* 05/01/2013 1357   GLUCOSE 84 12/20/2012 0945   BUN 20 05/01/2013 1357   BUN 21 12/20/2012 0945   CREATININE 0.90 05/01/2013 1357   CALCIUM 9.4 05/01/2013 1342   PROT 7.2 05/01/2013 1342   PROT 6.2 12/20/2012 0945   ALBUMIN 3.7 05/01/2013 1342   AST 24 05/01/2013 1342   ALT 18 05/01/2013 1342   ALKPHOS 50 05/01/2013 1342   BILITOT 0.2* 05/01/2013 1342   GFRNONAA 65* 05/01/2013 1342   GFRAA 75* 05/01/2013 1342   Lab Results  Component Value Date   CHOL 160 05/02/2013   HDL 88 05/02/2013   LDLCALC 59 05/02/2013   TRIG 63 05/02/2013   CHOLHDL 1.8 05/02/2013   Lab Results  Component Value Date   HGBA1C 5.3 05/02/2013   No results found for this basename: VITAMINB12   Lab Results  Component Value Date   TSH 4.400 12/20/2012      ASSESSMENT AND PLAN   80 years old right-handed Caucasian female, with vascular risk factor of hypertension, presenting with episode of left facial, hand numbness, does suggestive a right thalamic TIA, she was taking aspirin, not taking  Plavix, MRI of the brain showed moderate atrophy, periventricular small vessel disease, MRA of the brain showed a 3 point 5 mm possible left posterior cerebral artery aneurysm, vs. a branch, patient is asymptomatic now, normal examination for her age.  1.  Continue Plavix 2. Moderate exercise 3. Return to clinic in 6 months  4. her left posterior cerebral artery aneurysm size is less than 3.5 mm, very small chance, less than 10 in 100,000 chance of bleeding, may consider repeating CT angiogram if she develops focal symptoms, or worsening headaches,   Marcial Pacas, M.D. Ph.D.  [facility] [address] [place], [place] [address] [phone]

## 2013-06-09 DIAGNOSIS — H409 Unspecified glaucoma: Secondary | ICD-10-CM | POA: Diagnosis not present

## 2013-06-09 DIAGNOSIS — H2640 Unspecified secondary cataract: Secondary | ICD-10-CM | POA: Diagnosis not present

## 2013-06-09 DIAGNOSIS — H3530 Unspecified macular degeneration: Secondary | ICD-10-CM | POA: Diagnosis not present

## 2013-06-09 DIAGNOSIS — H4011X0 Primary open-angle glaucoma, stage unspecified: Secondary | ICD-10-CM | POA: Diagnosis not present

## 2013-06-20 DIAGNOSIS — L570 Actinic keratosis: Secondary | ICD-10-CM | POA: Diagnosis not present

## 2013-06-20 DIAGNOSIS — D235 Other benign neoplasm of skin of trunk: Secondary | ICD-10-CM | POA: Diagnosis not present

## 2013-06-24 ENCOUNTER — Encounter: Payer: Self-pay | Admitting: Internal Medicine

## 2013-06-24 ENCOUNTER — Ambulatory Visit (INDEPENDENT_AMBULATORY_CARE_PROVIDER_SITE_OTHER): Payer: Medicare Other | Admitting: Internal Medicine

## 2013-06-24 VITALS — BP 112/68 | HR 62 | Temp 97.4°F | Wt 125.2 lb

## 2013-06-24 DIAGNOSIS — K219 Gastro-esophageal reflux disease without esophagitis: Secondary | ICD-10-CM | POA: Diagnosis not present

## 2013-06-24 DIAGNOSIS — I10 Essential (primary) hypertension: Secondary | ICD-10-CM | POA: Diagnosis not present

## 2013-06-24 DIAGNOSIS — E039 Hypothyroidism, unspecified: Secondary | ICD-10-CM | POA: Diagnosis not present

## 2013-06-24 DIAGNOSIS — G459 Transient cerebral ischemic attack, unspecified: Secondary | ICD-10-CM

## 2013-06-24 DIAGNOSIS — I671 Cerebral aneurysm, nonruptured: Secondary | ICD-10-CM

## 2013-06-24 DIAGNOSIS — H3530 Unspecified macular degeneration: Secondary | ICD-10-CM

## 2013-06-24 NOTE — Patient Instructions (Signed)
Continue current medications. 

## 2013-06-24 NOTE — Progress Notes (Signed)
Patient ID: [name], female   DOB: 1928/12/07, 80 y.o.   MRN: [id number]    Location:    [facility]    [name]:  OFFICE   Allergies  Allergen Reactions  . Latex     To face  . Erythromycin   . Macrodantin [Nitrofurantoin Macrocrystal]   . Penicillins   . Septra [Sulfamethoxazole-Tmp Ds]   . Sulfa Antibiotics   . Trimethoprim     Chief Complaint  Patient presents with  . Medical Managment of Chronic Issues    6 month f/u, no recent labs  . other    questions regarding multivitamins & would like to get the Pneumo 13 vaccine    HPI:  Unspecified essential hypertension: controlled  TIA (transient ischemic attack): no residual problems. Has seen Dr. Krista Blue. She is not sure there is an aneurysm according to patient.  Unspecified hypothyroidism: stable  GERD (gastroesophageal reflux disease): asymptomatic  Recent problem with gums treated with clindamycin by her dentist. Now better.  Medications: Patient's Medications  New Prescriptions   No medications on file  Previous Medications   BIMATOPROST (LUMIGAN) 0.01 % SOLN    Place 1 drop into both eyes at bedtime.   CALCIUM CARBONATE (TUMS - DOSED IN MG ELEMENTAL CALCIUM) 500 MG CHEWABLE TABLET    Chew 3 tablets by mouth 2 (two) times daily.   CHOLECALCIFEROL (VITAMIN D3) 2000 UNITS TABS    Take 2,000 Units/day by mouth 2 (two) times daily.   CLINDAMYCIN (CLEOCIN) 150 MG CAPSULE    Take 150 mg by mouth 4 (four) times daily.   CLOPIDOGREL (PLAVIX) 75 MG TABLET    One daily at breakfast to prevent stroke   HYDROCORTISONE CREAM 1 %    Apply 1 application topically as needed for itching.   METOPROLOL SUCCINATE (TOPROL-XL) 25 MG 24 HR TABLET    One daily to regulate heart and control BP   MULTIPLE VITAMINS-MINERALS (ICAPS PO)    Take 2 capsules by mouth 2 (two) times daily.   NON FORMULARY    Sustain Eye Drops: Use three times daily   PANTOPRAZOLE (PROTONIX) 40 MG TABLET    One daily to reduce stomach acid and protect  the esophagus   POLYETHYL GLYCOL-PROPYL GLYCOL (SYSTANE ULTRA PF) 0.4-0.3 % SOLN    Apply 1 ampule to eye 3 (three) times daily.   RALOXIFENE (EVISTA) 60 MG TABLET    Take 1 tablet (60 mg total) by mouth daily. Generic ok  Modified Medications   No medications on file  Discontinued Medications   No medications on file     Review of Systems  Constitutional: Negative.   HENT: Negative.   Eyes:       Left cataract. Dry macular degeneration.  Respiratory: Negative.   Cardiovascular: Negative.   Gastrointestinal: Negative.   Endocrine: Negative.   Genitourinary:       Stress incontinence.  Musculoskeletal:       Some joint pains.  Skin: Negative.   Allergic/Immunologic: Negative.   Neurological: Negative.   Hematological: Negative.   Psychiatric/Behavioral: Negative.     Filed Vitals:   06/24/13 1351  BP: 112/68  Pulse: 62  Temp: 97.4 F (36.3 C)  TempSrc: Oral  Weight: 125 lb 3.2 oz (56.79 kg)  SpO2: 98%   Physical Exam  Constitutional: She is oriented to person, place, and time. She appears well-developed and well-nourished. No distress.  HENT:  Head: Normocephalic and atraumatic.  Right Ear: External ear normal.  Left Ear: External ear normal.  Nose: Nose normal.  Mouth/Throat: Oropharynx is clear and moist.  Eyes: Conjunctivae are normal. Pupils are equal, round, and reactive to light. Left eye exhibits no discharge.  Neck: No JVD present. No tracheal deviation present. No thyromegaly present.  Cardiovascular: Normal rate, regular rhythm, normal heart sounds and intact distal pulses.  Exam reveals no gallop and no friction rub.   No murmur heard. Pulmonary/Chest: No respiratory distress. She has no wheezes. She has no rales. She exhibits no tenderness.  Abdominal: She exhibits no distension and no mass. There is no tenderness.  Musculoskeletal: She exhibits no edema and no tenderness.  Lymphadenopathy:    She has no cervical adenopathy.  Neurological: She is  alert and oriented to person, place, and time. She has normal reflexes. No cranial nerve deficit. Coordination normal.  Skin: No rash noted. No erythema. No pallor.  Psychiatric: She has a normal mood and affect. Her behavior is normal. Judgment and thought content normal.     Labs reviewed: Abstract on 05/16/2013  Component Date Value Range Status  . HM Mammogram 04/07/2013 NL   Final  . HM Colonoscopy 09/24/2006 Diverticulosis/family Hx Colon Neoplasia, repeat 2013   Final  . HM Dexa Scan 12/02/2010 [facility], Osteopenia   Final  Admission on 05/01/2013, Discharged on 05/02/2013  Component Date Value Range Status  . Alcohol, Ethyl (B) 05/01/2013 <11  0 - 11 mg/dL Final   Comment:                                 LOWEST DETECTABLE LIMIT FOR                          SERUM ALCOHOL IS 11 mg/dL                          FOR MEDICAL PURPOSES ONLY  . Prothrombin Time 05/01/2013 12.2  11.6 - 15.2 seconds Final  . INR 05/01/2013 0.92  0.00 - 1.49 Final  . aPTT 05/01/2013 33  24 - 37 seconds Final  . WBC 05/01/2013 7.1  4.0 - 10.5 K/uL Final  . RBC 05/01/2013 4.25  3.87 - 5.11 MIL/uL Final  . Hemoglobin 05/01/2013 13.4  12.0 - 15.0 g/dL Final  . HCT 05/01/2013 40.2  36.0 - 46.0 % Final  . MCV 05/01/2013 94.6  78.0 - 100.0 fL Final  . MCH 05/01/2013 31.5  26.0 - 34.0 pg Final  . MCHC 05/01/2013 33.3  30.0 - 36.0 g/dL Final  . RDW 05/01/2013 14.6  11.5 - 15.5 % Final  . Platelets 05/01/2013 236  150 - 400 K/uL Final  . Neutrophils Relative % 05/01/2013 62  43 - 77 % Final  . Neutro Abs 05/01/2013 4.4  1.7 - 7.7 K/uL Final  . Lymphocytes Relative 05/01/2013 30  12 - 46 % Final  . Lymphs Abs 05/01/2013 2.1  0.7 - 4.0 K/uL Final  . Monocytes Relative 05/01/2013 7  3 - 12 % Final  . Monocytes Absolute 05/01/2013 0.5  0.1 - 1.0 K/uL Final  . Eosinophils Relative 05/01/2013 1  0 - 5 % Final  . Eosinophils Absolute 05/01/2013 0.1  0.0 - 0.7 K/uL Final  . Basophils Relative 05/01/2013 0   0 - 1 % Final  . Basophils Absolute 05/01/2013 0.0  0.0 - 0.1 K/uL Final  .  Sodium 05/01/2013 139  135 - 145 mEq/L Final  . Potassium 05/01/2013 3.8  3.5 - 5.1 mEq/L Final  . Chloride 05/01/2013 103  96 - 112 mEq/L Final  . CO2 05/01/2013 28  19 - 32 mEq/L Final  . Glucose, Bld 05/01/2013 153* 70 - 99 mg/dL Final  . BUN 05/01/2013 19  6 - 23 mg/dL Final  . Creatinine, Ser 05/01/2013 0.82  0.50 - 1.10 mg/dL Final  . Calcium 05/01/2013 9.4  8.4 - 10.5 mg/dL Final  . Total Protein 05/01/2013 7.2  6.0 - 8.3 g/dL Final  . Albumin 05/01/2013 3.7  3.5 - 5.2 g/dL Final  . AST 05/01/2013 24  0 - 37 U/L Final  . ALT 05/01/2013 18  0 - 35 U/L Final  . Alkaline Phosphatase 05/01/2013 50  39 - 117 U/L Final  . Total Bilirubin 05/01/2013 0.2* 0.3 - 1.2 mg/dL Final  . GFR calc non Af Amer 05/01/2013 65* >90 mL/min Final  . GFR calc Af Amer 05/01/2013 75* >90 mL/min Final   Comment: (NOTE)                          The eGFR has been calculated using the CKD EPI equation.                          This calculation has not been validated in all clinical situations.                          eGFR's persistently <90 mL/min signify possible Chronic Kidney                          Disease.  . Troponin I 05/01/2013 <0.30  <0.30 ng/mL Final   Comment:                                 Due to the release kinetics of cTnI,                          a negative result within the first hours                          of the onset of symptoms does not rule out                          myocardial infarction with certainty.                          If myocardial infarction is still suspected,                          repeat the test at appropriate intervals.  . Opiates 05/01/2013 NONE DETECTED  NONE DETECTED Final  . Cocaine 05/01/2013 NONE DETECTED  NONE DETECTED Final  . Benzodiazepines 05/01/2013 NONE DETECTED  NONE DETECTED Final  . Amphetamines 05/01/2013 NONE DETECTED  NONE DETECTED Final  . Tetrahydrocannabinol  05/01/2013 NONE DETECTED  NONE DETECTED Final  . Barbiturates 05/01/2013 NONE DETECTED  NONE DETECTED Final   Comment:  DRUG SCREEN FOR MEDICAL PURPOSES                          ONLY.  IF CONFIRMATION IS NEEDED                          FOR ANY PURPOSE, NOTIFY LAB                          WITHIN 5 DAYS.                                                          LOWEST DETECTABLE LIMITS                          FOR URINE DRUG SCREEN                          Drug Class       Cutoff (ng/mL)                          Amphetamine      1000                          Barbiturate      200                          Benzodiazepine   200                          Tricyclics       704                          Opiates          300                          Cocaine          300                          THC              50  . Color, Urine 05/01/2013 YELLOW  YELLOW Final  . APPearance 05/01/2013 CLEAR  CLEAR Final  . Specific Gravity, Urine 05/01/2013 1.010  1.005 - 1.030 Final  . pH 05/01/2013 6.0  5.0 - 8.0 Final  . Glucose, UA 05/01/2013 NEGATIVE  NEGATIVE mg/dL Final  . Hgb urine dipstick 05/01/2013 NEGATIVE  NEGATIVE Final  . Bilirubin Urine 05/01/2013 NEGATIVE  NEGATIVE Final  . Ketones, ur 05/01/2013 NEGATIVE  NEGATIVE mg/dL Final  . Protein, ur 05/01/2013 NEGATIVE  NEGATIVE mg/dL Final  . Urobilinogen, UA 05/01/2013 0.2  0.0 - 1.0 mg/dL Final  . Nitrite 05/01/2013 NEGATIVE  NEGATIVE Final  . Leukocytes, UA 05/01/2013 TRACE* NEGATIVE Final  . Glucose-Capillary 05/01/2013 154* 70 - 99 mg/dL Final  . Sodium 05/01/2013 141  135 - 145 mEq/L Final  . Potassium 05/01/2013 3.8  3.5 -  5.1 mEq/L Final  . Chloride 05/01/2013 103  96 - 112 mEq/L Final  . BUN 05/01/2013 20  6 - 23 mg/dL Final  . Creatinine, Ser 05/01/2013 0.90  0.50 - 1.10 mg/dL Final  . Glucose, Bld 05/01/2013 155* 70 - 99 mg/dL Final  . Calcium, Ion 05/01/2013 1.24  1.13 - 1.30 mmol/L Final  . TCO2  05/01/2013 26  0 - 100 mmol/L Final  . Hemoglobin 05/01/2013 14.6  12.0 - 15.0 g/dL Final  . HCT 05/01/2013 43.0  36.0 - 46.0 % Final  . Squamous Epithelial / LPF 05/01/2013 FEW* RARE Final  . WBC, UA 05/01/2013 0-2  <3 WBC/hpf Final  . Bacteria, UA 05/01/2013 FEW* RARE Final  . Edwina Barth 05/01/2013 AMORPHOUS URATES/PHOSPHATES   Final  . Hemoglobin A1C 05/02/2013 5.3  <5.7 % Final   Comment: (NOTE)                                                                                                                         According to the ADA Clinical Practice Recommendations for 2011, when                          HbA1c is used as a screening test:                           >=6.5%   Diagnostic of Diabetes Mellitus                                    (if abnormal result is confirmed)                          5.7-6.4%   Increased risk of developing Diabetes Mellitus                          References:Diagnosis and Classification of Diabetes Mellitus,Diabetes                          XTGG,2694,85(IOEVO 1):S62-S69 and Standards of Medical Care in                                  Diabetes - 2011,Diabetes Care,2011,34 (Suppl 1):S11-S61.  . Mean Plasma Glucose 05/02/2013 105  <117 mg/dL Final   Performed at [facility]  . Cholesterol 05/02/2013 160  0 - 200 mg/dL Final  . Triglycerides 05/02/2013 63  <150 mg/dL Final  . HDL 05/02/2013 88  >39 mg/dL Final  . Total CHOL/HDL Ratio 05/02/2013 1.8   Final  . VLDL 05/02/2013 13  0 - 40 mg/dL Final  . LDL Cholesterol 05/02/2013 59  0 - 99 mg/dL Final   Comment:  Total Cholesterol/HDL:CHD Risk                          Coronary Heart Disease Risk Table                                              Men   Women                           1/2 Average Risk   3.4   3.3                           Average Risk       5.0   4.4                           2 X Average Risk   9.6   7.1                           3 X Average Risk   23.4   11.0                                                          Use the calculated Patient Ratio                          above and the CHD Risk Table                          to determine the patient's CHD Risk.                                                          ATP III CLASSIFICATION (LDL):                           <100     mg/dL   Optimal                           100-129  mg/dL   Near or Above                                             Optimal                           130-159  mg/dL   Borderline                           160-189  mg/dL   High                           >  190     mg/dL   Very High      Assessment/Plan Unspecified essential hypertension: controlled  TIA (transient ischemic attack): no residual problems  Unspecified hypothyroidism: stable  GERD (gastroesophageal reflux disease): asymptomatic  Brain aneurysm: no reason to do anything more at this time

## 2013-06-26 DIAGNOSIS — H26499 Other secondary cataract, unspecified eye: Secondary | ICD-10-CM | POA: Diagnosis not present

## 2013-06-26 DIAGNOSIS — H2640 Unspecified secondary cataract: Secondary | ICD-10-CM | POA: Diagnosis not present

## 2013-07-24 ENCOUNTER — Encounter: Payer: Self-pay | Admitting: Internal Medicine

## 2013-07-26 ENCOUNTER — Other Ambulatory Visit: Payer: Self-pay | Admitting: Internal Medicine

## 2013-08-19 ENCOUNTER — Encounter: Payer: Self-pay | Admitting: Internal Medicine

## 2013-08-26 DIAGNOSIS — Z5181 Encounter for therapeutic drug level monitoring: Secondary | ICD-10-CM | POA: Diagnosis not present

## 2013-08-26 DIAGNOSIS — Z79899 Other long term (current) drug therapy: Secondary | ICD-10-CM | POA: Diagnosis not present

## 2013-08-26 DIAGNOSIS — Z01812 Encounter for preprocedural laboratory examination: Secondary | ICD-10-CM | POA: Diagnosis not present

## 2013-09-16 HISTORY — PX: TOOTH EXTRACTION: SUR596

## 2013-10-28 ENCOUNTER — Telehealth: Payer: Self-pay | Admitting: *Deleted

## 2013-10-28 NOTE — Telephone Encounter (Signed)
I telephoned [name]. She is going to stay off the Evista until we get results of a bone density scan. Her last scan was over 2 years ago. She will need scheduling of this test. She is out of town and will call next week to arrange scheduling a bone density test.

## 2013-10-28 NOTE — Telephone Encounter (Signed)
Patient called and wants to speak with Dr. Nyoka Cowden regarding her Evista. Stopped the Evista when she had a tooth extraction and wants to talk with you regarding some risk starting it back. Please call on cell phone she is out of town. But is awaiting your call today or tomorrow will be the best time #  [phone]

## 2013-10-29 ENCOUNTER — Other Ambulatory Visit: Payer: Self-pay | Admitting: *Deleted

## 2013-10-29 DIAGNOSIS — M810 Age-related osteoporosis without current pathological fracture: Secondary | ICD-10-CM

## 2013-10-29 NOTE — Telephone Encounter (Signed)
Ordered place for referral

## 2013-11-06 ENCOUNTER — Encounter: Payer: Self-pay | Admitting: Neurology

## 2013-11-07 ENCOUNTER — Telehealth: Payer: Self-pay | Admitting: *Deleted

## 2013-11-07 NOTE — Telephone Encounter (Signed)
[facility] faxed over an order for patient to have a Bone Density to follow up to the 2012 Dexa/Osteoporosis. Signed and faxed back to [facility] F#: [phone]

## 2013-11-10 DIAGNOSIS — M25539 Pain in unspecified wrist: Secondary | ICD-10-CM | POA: Diagnosis not present

## 2013-11-11 DIAGNOSIS — Z8262 Family history of osteoporosis: Secondary | ICD-10-CM | POA: Diagnosis not present

## 2013-11-11 DIAGNOSIS — M810 Age-related osteoporosis without current pathological fracture: Secondary | ICD-10-CM | POA: Diagnosis not present

## 2013-11-24 ENCOUNTER — Ambulatory Visit: Payer: Medicare Other | Admitting: Neurology

## 2013-11-28 ENCOUNTER — Encounter: Payer: Self-pay | Admitting: Internal Medicine

## 2013-12-17 ENCOUNTER — Encounter: Payer: Self-pay | Admitting: Neurology

## 2013-12-24 ENCOUNTER — Ambulatory Visit (INDEPENDENT_AMBULATORY_CARE_PROVIDER_SITE_OTHER): Payer: Medicare Other | Admitting: Internal Medicine

## 2013-12-24 ENCOUNTER — Encounter: Payer: Self-pay | Admitting: Internal Medicine

## 2013-12-24 VITALS — BP 120/74 | HR 66 | Temp 97.7°F | Ht 61.0 in | Wt 123.4 lb

## 2013-12-24 DIAGNOSIS — R2689 Other abnormalities of gait and mobility: Secondary | ICD-10-CM

## 2013-12-24 DIAGNOSIS — H409 Unspecified glaucoma: Secondary | ICD-10-CM

## 2013-12-24 DIAGNOSIS — K219 Gastro-esophageal reflux disease without esophagitis: Secondary | ICD-10-CM | POA: Diagnosis not present

## 2013-12-24 DIAGNOSIS — M810 Age-related osteoporosis without current pathological fracture: Secondary | ICD-10-CM

## 2013-12-24 DIAGNOSIS — E785 Hyperlipidemia, unspecified: Secondary | ICD-10-CM

## 2013-12-24 DIAGNOSIS — E039 Hypothyroidism, unspecified: Secondary | ICD-10-CM | POA: Diagnosis not present

## 2013-12-24 DIAGNOSIS — H3530 Unspecified macular degeneration: Secondary | ICD-10-CM

## 2013-12-24 DIAGNOSIS — M545 Low back pain, unspecified: Secondary | ICD-10-CM

## 2013-12-24 DIAGNOSIS — R29818 Other symptoms and signs involving the nervous system: Secondary | ICD-10-CM

## 2013-12-24 DIAGNOSIS — I10 Essential (primary) hypertension: Secondary | ICD-10-CM

## 2013-12-24 DIAGNOSIS — K4090 Unilateral inguinal hernia, without obstruction or gangrene, not specified as recurrent: Secondary | ICD-10-CM | POA: Insufficient documentation

## 2013-12-24 MED ORDER — METOPROLOL SUCCINATE ER 25 MG PO TB24: ORAL_TABLET | ORAL | Status: DC

## 2013-12-24 NOTE — Progress Notes (Signed)
Patient ID: [name], female   DOB: Jun 21, 1928, 80 y.o.   MRN: [id number]    Location:    [facility]  [name]:  OFFICE    Allergies  Allergen Reactions  . Latex     To face  . Erythromycin   . Macrodantin [Nitrofurantoin Macrocrystal]   . Penicillins   . Septra [Sulfamethoxazole-Tmp Ds]   . Sulfa Antibiotics   . Trimethoprim     Chief Complaint  Patient presents with  . Medical Management of Chronic Issues    6 month f/u, fall screening positive- fell face forward landing on hands/knees on 10/21/13    HPI:  [place] [employer] [place]. No injury.  Unspecified essential hypertension - controlled  Other and unspecified hyperlipidemia: recheck  Unspecified hypothyroidism: recheck next visdit  Gastroesophageal reflux disease without esophagitis: occasional. Continue Tums  Bilateral low back pain without sciatica: imroved  Macular degeneration (senile) of retina, unspecified: stable  Unspecified glaucoma(365.9): unchanged; controlled  Senile osteoporosis: confirmed on Bone density exam 11/11/13. Has been off Fosamax since 2005. Continues on vitamin D and calcium (Tums).  Headaches on the left frontal area. Some on the vertex. Last about a few minutes only. No associated symptoms. No vision defects or weakness. She worries abut here aneurysm. To see Dr. Krista Blue in Oct 2015.  Had swelling of the left wrist on 11/10/13. Unable to say what caused this. Got a wrist splint from Dr. Delilah Shan at [facility]. Had xray that was normal. Self corrected. Has not reoccurred.   Medications: Patient's Medications  New Prescriptions   No medications on file  Previous Medications   BIMATOPROST (LUMIGAN) 0.01 % SOLN    Place 1 drop into both eyes at bedtime.   CALCIUM CARBONATE (TUMS - DOSED IN MG ELEMENTAL CALCIUM) 500 MG CHEWABLE TABLET    Chew 3 tablets by mouth 2 (two) times daily.   CHOLECALCIFEROL (VITAMIN D3) 2000 UNITS TABS    Take 2,000 Units/day by mouth 2 (two) times daily.   CLOPIDOGREL (PLAVIX) 75 MG TABLET    One daily at breakfast to prevent stroke   HYDROCORTISONE CREAM 1 %    Apply 1 application topically as needed for itching.   METOPROLOL SUCCINATE (TOPROL-XL) 25 MG 24 HR TABLET    One daily to regulate heart and control BP   MULTIPLE VITAMINS-MINERALS (ICAPS) TABS    Take by mouth. Take 2 capsules twice daily   MULTIPLE VITAMINS-MINERALS (MULTIVITAMIN WITH MINERALS) TABLET    Take by mouth. Centrum Silver for Women  Take 1 tablet every other day   NON FORMULARY    Sustain Eye Drops: Use three times daily   PANTOPRAZOLE (PROTONIX) 40 MG TABLET    One daily to reduce stomach acid and protect the esophagus  Modified Medications   No medications on file  Discontinued Medications   CLINDAMYCIN (CLEOCIN) 150 MG CAPSULE    Take 150 mg by mouth 4 (four) times daily.   MULTIPLE VITAMINS-MINERALS (ICAPS PO)    Take 2 capsules by mouth 2 (two) times daily.   POLYETHYL GLYCOL-PROPYL GLYCOL (SYSTANE ULTRA PF) 0.4-0.3 % SOLN    Apply 1 ampule to eye 3 (three) times daily.   RALOXIFENE (EVISTA) 60 MG TABLET    Take 1 tablet (60 mg total) by mouth daily. Generic ok     Review of Systems  Constitutional: Negative.   HENT: Negative.   Eyes:       Left cataract. Dry macular degeneration.  Respiratory: Negative.  Cardiovascular: Negative.   Gastrointestinal: Negative.   Endocrine: Negative.   Genitourinary:       Stress incontinence.  Musculoskeletal:       Some joint pains. Bilateral shoulder discomfort.  Skin: Negative.   Allergic/Immunologic: Negative.   Neurological: Negative.   Hematological: Negative.   Psychiatric/Behavioral: Negative.     Filed Vitals:   12/24/13 1134  BP: 120/74  Pulse: 66  Temp: 97.7 F (36.5 C)  TempSrc: Oral  Height: 5\' 1"  (1.549 m)  Weight: 123 lb 6.4 oz (55.974 kg)  SpO2: 98%   Body mass index is 23.33 kg/(m^2).  Physical Exam  Constitutional: She is oriented to person, place, and time. She appears well-developed  and well-nourished. No distress.  HENT:  Head: Normocephalic and atraumatic.  Right Ear: External ear normal.  Left Ear: External ear normal.  Nose: Nose normal.  Mouth/Throat: Oropharynx is clear and moist.  Eyes: Conjunctivae are normal. Pupils are equal, round, and reactive to light. Left eye exhibits no discharge.  Neck: No JVD present. No tracheal deviation present. No thyromegaly present.  Cardiovascular: Normal rate, regular rhythm, normal heart sounds and intact distal pulses.  Exam reveals no gallop and no friction rub.   No murmur heard. Pulmonary/Chest: No respiratory distress. She has no wheezes. She has no rales. She exhibits no tenderness.  Abdominal: She exhibits no distension and no mass. There is no tenderness.  Right inguinal hernia may be a little larger.  Musculoskeletal: She exhibits no edema and no tenderness.  FROM at shoulders. Feels like she has some issues with her balance.  Lymphadenopathy:    She has no cervical adenopathy.  Neurological: She is alert and oriented to person, place, and time. She has normal reflexes. No cranial nerve deficit. Coordination normal.  Skin: No rash noted. No erythema. No pallor.  Psychiatric: She has a normal mood and affect. Her behavior is normal. Judgment and thought content normal.     Labs reviewed: No visits with results within 3 Month(s) from this visit. Latest known visit with results is:  Abstract on 05/16/2013  Component Date Value Ref Range Status  . HM Mammogram 04/07/2013 NL   Final  . HM Colonoscopy 09/24/2006 Diverticulosis/family Hx Colon Neoplasia, repeat 2013   Final  . HM Dexa Scan 12/02/2010 [facility], Osteopenia   Final      Assessment/Plan 1. Unspecified essential hypertension controlled - metoprolol succinate (TOPROL-XL) 25 MG 24 hr tablet; One daily to regulate heart and control BP  Dispense: 90 tablet; Refill: 1 - CBC With differential/Platelet; Future - Comprehensive metabolic  panel; Future  2. Other and unspecified hyperlipidemia - Lipid panel; Future  3. Unspecified hypothyroidism - TSH; Future  4. Gastroesophageal reflux disease without esophagitis mild symptoms  5. Bilateral low back pain without sciatica improved  6. Macular degeneration (senile) of retina, unspecified unchanged  7. Unspecified glaucoma(365.9) unchanged  8. Senile osteoporosis ContinueTums and Vit D  9. Unilateral inguinal hernia without obstruction or gangrene, recurrence not specified unchanged  10. Balance problem Re-enroll in balance class at her church

## 2014-01-29 DIAGNOSIS — H401290 Low-tension glaucoma, unspecified eye, stage unspecified: Secondary | ICD-10-CM | POA: Diagnosis not present

## 2014-01-29 DIAGNOSIS — H3530 Unspecified macular degeneration: Secondary | ICD-10-CM | POA: Diagnosis not present

## 2014-01-29 DIAGNOSIS — H409 Unspecified glaucoma: Secondary | ICD-10-CM | POA: Diagnosis not present

## 2014-01-29 DIAGNOSIS — Z961 Presence of intraocular lens: Secondary | ICD-10-CM | POA: Diagnosis not present

## 2014-02-10 ENCOUNTER — Ambulatory Visit (INDEPENDENT_AMBULATORY_CARE_PROVIDER_SITE_OTHER): Payer: Medicare Other

## 2014-02-10 DIAGNOSIS — Z23 Encounter for immunization: Secondary | ICD-10-CM | POA: Diagnosis not present

## 2014-03-06 ENCOUNTER — Ambulatory Visit: Payer: Medicare Other | Admitting: Neurology

## 2014-03-09 ENCOUNTER — Encounter: Payer: Self-pay | Admitting: Neurology

## 2014-03-09 ENCOUNTER — Ambulatory Visit (INDEPENDENT_AMBULATORY_CARE_PROVIDER_SITE_OTHER): Payer: Medicare Other | Admitting: Neurology

## 2014-03-09 VITALS — BP 122/87 | HR 69 | Ht 62.0 in | Wt 122.0 lb

## 2014-03-09 DIAGNOSIS — G459 Transient cerebral ischemic attack, unspecified: Secondary | ICD-10-CM | POA: Diagnosis not present

## 2014-03-09 NOTE — Progress Notes (Signed)
[facility]  PATIENT: [name] DOB: [birth year]  HISTORICAL  [name] is a pleasant 80  years old right-handed Caucasian female, accompanied by her daughter [name], referred by her primary care physician Dr. Sherald Stein for evaluation of possible left posterior cerebral artery aneurysm   She had a past medical history of hypertension, well controlled by low-dose metoprolol, still active, exercises regularly, taking baby aspirin daily   In December 18th 2014, while driving, she presented with acute onset of left facial, left arm numbness, lasting about 2 hours, presented to the emergency room, was diagnosed with TIA, she was switched from aspirin to Plavix, she denies weakness, no dysarthria at that time,  MRI of the brain has showed moderate atrophy, and moderate periventricular small vessel disease, but there was no acute stroke. MRA of the brain showed no large vessel disease, there was a possible left posterior cerebral artery aneurysm vs. branching, 3 point 5 mm, we have reviewed film together   She is normal back to her baseline, very active, driving, exercise 3 times a week,   She denies a previous history of headaches, now presenting with intermittent few seconds left frontal area sharp headaches occasionally  UPDATE Oct 26th 2015: She has two headaches over the past few months, on the top of her left head, no other symptoms, lasting for a few minutes no focal signs, she is taking Plavix daily  Was able to walk 5 km without much difficulty   REVIEW OF SYSTEMS: Full 14 system review of systems performed and notable only for bladder incontinence, hearing loss, bruising easily  ALLERGIES: Allergies  Allergen Reactions  . Latex     To face  . Erythromycin   . Macrodantin [Nitrofurantoin Macrocrystal]   . Penicillins   . Septra [Sulfamethoxazole-Tmp Ds]   . Sulfa Antibiotics   . Trimethoprim     HOME MEDICATIONS: Outpatient Prescriptions  Prior to Visit  Medication Sig Dispense Refill  . bimatoprost (LUMIGAN) 0.01 % SOLN Place 1 drop into both eyes at bedtime.      . calcium carbonate (TUMS - DOSED IN MG ELEMENTAL CALCIUM) 500 MG chewable tablet Chew 3 tablets by mouth 2 (two) times daily.      . Cholecalciferol (VITAMIN D3) 2000 UNITS TABS Take 2,000 Units/day by mouth 2 (two) times daily.      . clopidogrel (PLAVIX) 75 MG tablet One daily at breakfast to prevent stroke  90 tablet  4  . hydrocortisone cream 1 % Apply 1 application topically as needed for itching.      . metoprolol succinate (TOPROL-XL) 25 MG 24 hr tablet One daily to regulate heart and control BP  90 tablet  3  . Multiple Vitamins-Minerals (ICAPS PO) Take 2 capsules by mouth 2 (two) times daily.      . pantoprazole (PROTONIX) 40 MG tablet One daily to reduce stomach acid and protect the esophagus  90 tablet  4  . Polyethyl Glycol-Propyl Glycol (SYSTANE ULTRA PF) 0.4-0.3 % SOLN Apply 1 ampule to eye 3 (three) times daily.      . raloxifene (EVISTA) 60 MG tablet Take 1 tablet (60 mg total) by mouth daily. Generic ok  90 tablet  3    PAST MEDICAL HISTORY: Past Medical History  Diagnosis Date  . Chest pain at rest 01/07/2012  . Pain in joint, hand   . Unspecified hypothyroidism   . Inguinal hernia without mention of obstruction or gangrene, unilateral or unspecified, (not specified as  recurrent)   . Senile osteoporosis   . Unspecified vitamin D deficiency   . Female stress incontinence   . Macular degeneration (senile) of retina, unspecified   . Unspecified glaucoma   . Osteoarthrosis, unspecified whether generalized or localized, unspecified site   . Unspecified essential hypertension   . Internal hemorrhoids without mention of complication   . Lumbago   . Reflux esophagitis   . Displacement of cervical intervertebral disc without myelopathy   . Other and unspecified hyperlipidemia   . Cervicalgia   . Scoliosis (and kyphoscoliosis), idiopathic   .  Diverticulosis of colon (without mention of hemorrhage)   . Palpitations     PAST SURGICAL HISTORY: Past Surgical History  Procedure Laterality Date  . Tonsillectomy  1937  . Foot surgery  august 2013    Doran Durand, MD  . Abdominal hysterectomy  1975    Dr Mallie Mussel  . Appendectomy  1988  . Eye surgery Bilateral 2009    cataract removed right eye, Dr Charise Killian    FAMILY HISTORY: Family History  Problem Relation Age of Onset  . CAD Mother   . Parkinson's disease Mother   . Cancer Father     colon cancer  . Parkinson's disease Father   . Stroke Maternal Grandmother     SOCIAL HISTORY:  History   Social History  . Marital Status: Widowed    Spouse Name: N/A    Number of Children: 2  . Years of Education: college   Occupational History    retired   Social History Main Topics  . Smoking status: Never Smoker   . Smokeless tobacco: Never Used  . Alcohol Use: No  . Drug Use: No  . Sexual Activity: Not on file   Other Topics Concern  . Not on file   Social History Narrative   Patient lives at home with her daughter [name]) - Widowed.   Retired.   EducationNurse, mental health.   Right handed.   Caffeine- None some times tea very rare.    PHYSICAL EXAM   Filed Vitals:   03/09/14 1333  BP: 122/87  Pulse: 69  Height: 5\' 2"  (1.575 m)  Weight: 122 lb (55.339 kg)    Not recorded    Body mass index is 22.31 kg/(m^2).   Generalized: In no acute distress  Neck: Supple, no carotid bruits   Cardiac: Regular rate rhythm  Pulmonary: Clear to auscultation bilaterally  Musculoskeletal: No deformity  Neurological examination  Mentation: Alert oriented to time, place, history taking, and causual conversation  Cranial nerve II-XII: Pupils were equal round reactive to light extraocular movements were full, Visual field were full on confrontational test. Bilateral fundi were sharp.  Facial sensation and strength were normal. Hearing was intact to finger rubbing bilaterally. Uvula  tongue midline.  head turning and shoulder shrug and were normal and symmetric.Tongue protrusion into cheek strength was normal.  Motor: normal tone, bulk and strength.  Sensory: Intact to fine touch, pinprick, preserved vibratory sensation, and proprioception at toes.  Coordination: Normal finger to nose, heel-to-shin bilaterally there was no truncal ataxia  Gait: Rising up from seated position without assistance, normal stance, without trunk ataxia, moderate stride, good arm swing, smooth turning,she has mild difficulty with tiptoe, heel walking, which is age appropriate   Deep tendon reflexes, brachial radialis 2/2, biceps 2/2, triceps 2/2, patellar 2/2, Achilles 2/2, plantar responses were flexor bilaterally.   DIAGNOSTIC DATA (LABS, IMAGING, TESTING) - I reviewed patient records, labs, notes, testing and imaging myself  where available.  Lab Results  Component Value Date   WBC 7.1 05/01/2013   HGB 14.6 05/01/2013   HCT 43.0 05/01/2013   MCV 94.6 05/01/2013   PLT 236 05/01/2013      Component Value Date/Time   NA 141 05/01/2013 1357   NA 139 12/20/2012 0945   K 3.8 05/01/2013 1357   CL 103 05/01/2013 1357   CO2 28 05/01/2013 1342   GLUCOSE 155* 05/01/2013 1357   GLUCOSE 84 12/20/2012 0945   BUN 20 05/01/2013 1357   BUN 21 12/20/2012 0945   CREATININE 0.90 05/01/2013 1357   CALCIUM 9.4 05/01/2013 1342   PROT 7.2 05/01/2013 1342   PROT 6.2 12/20/2012 0945   ALBUMIN 3.7 05/01/2013 1342   AST 24 05/01/2013 1342   ALT 18 05/01/2013 1342   ALKPHOS 50 05/01/2013 1342   BILITOT 0.2* 05/01/2013 1342   GFRNONAA 65* 05/01/2013 1342   GFRAA 75* 05/01/2013 1342   Lab Results  Component Value Date   CHOL 160 05/02/2013   HDL 88 05/02/2013   LDLCALC 59 05/02/2013   TRIG 63 05/02/2013   CHOLHDL 1.8 05/02/2013   Lab Results  Component Value Date   HGBA1C 5.3 05/02/2013   No results found for this basename: VITAMINB12   Lab Results  Component Value Date   TSH 4.400 12/20/2012       ASSESSMENT AND PLAN   80 years old right-handed Caucasian female, with vascular risk factor of hypertension, presenting with episode of left facial, hand numbness, does suggestive a right thalamic TIA, she was taking aspirin, now taking Plavix, MRI of the brain showed moderate atrophy, periventricular small vessel disease, MRA of the brain showed a 3 point 5 mm possible left posterior cerebral artery aneurysm, vs. a branch, patient is asymptomatic now, normal examination for her age.  1.  Continue Plavix 2. Moderate exercise 3.  We have reviewed MRI and MRA of the brain again, possible left posterior cerebral artery aneurysm size is less than 3.5 mm, very small chance, less than 10 in 100,000 chance of bleeding, after discussion, we will not repeat angiogram at this point, 4. Return to clinic for new issues    Marcial Pacas, M.D. Ph.D.  [facility] [address], [place], [place] [address] [phone]

## 2014-03-18 DIAGNOSIS — H6123 Impacted cerumen, bilateral: Secondary | ICD-10-CM | POA: Diagnosis not present

## 2014-03-25 ENCOUNTER — Ambulatory Visit (INDEPENDENT_AMBULATORY_CARE_PROVIDER_SITE_OTHER): Payer: Medicare Other | Admitting: Cardiovascular Disease

## 2014-03-25 ENCOUNTER — Encounter: Payer: Self-pay | Admitting: Cardiovascular Disease

## 2014-03-25 VITALS — BP 120/84 | HR 68 | Ht 62.0 in | Wt 124.5 lb

## 2014-03-25 DIAGNOSIS — R002 Palpitations: Secondary | ICD-10-CM

## 2014-03-25 NOTE — Assessment & Plan Note (Signed)
History of palpitations in the past, on low-dose beta blocker (metoprolol) who is currently asymptomatic. Continue current medications

## 2014-03-25 NOTE — Patient Instructions (Signed)
Your physician wants you to follow-up in 1 year with Dr. Berry. You will receive a reminder letter in the mail 2 months in advance. If you do not receive a letter, please call our office to schedule the follow-up appointment.  

## 2014-03-25 NOTE — Progress Notes (Signed)
03/25/2014 [place]   Sep 22, 1928  [id number]  Primary Physician GREEN, Viviann Spare, MD Primary Cardiologist: Lorretta Harp MD Renae Gloss   HPI:  The patient is an 80 year old thin appearing widowed Caucasian female mother of 2, grandmother of 2 grandchildren who I last saw 1 years ago. Her only symptoms at that time were palpitations on a low-dose beta blocker. She also had a history of GERD. She was admitted to [facility] on January 06, 2013 with chest pain. She ruled out for myocardial infarction. She had a Myoview stress test which was normal. She had no recurrent symptoms.she also had TIA type symptoms last year and was diagnosed with a small "cerebral aneurysm "conservative medical therapy was recommended. She follows up with a neurologist and has been asymptomatic.   Current Outpatient Prescriptions  Medication Sig Dispense Refill  . bimatoprost (LUMIGAN) 0.01 % SOLN Place 1 drop into both eyes at bedtime.    . calcium carbonate (TUMS - DOSED IN MG ELEMENTAL CALCIUM) 500 MG chewable tablet Chew 3 tablets by mouth 2 (two) times daily.    . Cholecalciferol (VITAMIN D3) 2000 UNITS TABS Take 2,000 Units/day by mouth 2 (two) times daily.    . clopidogrel (PLAVIX) 75 MG tablet One daily at breakfast to prevent stroke 90 tablet 4  . hydrocortisone cream 1 % Apply 1 application topically as needed for itching.    . metoprolol succinate (TOPROL-XL) 25 MG 24 hr tablet One daily to regulate heart and control BP 90 tablet 1  . Multiple Vitamins-Minerals (ICAPS) TABS Take by mouth. Take 2 capsules twice daily    . Multiple Vitamins-Minerals (MULTIVITAMIN WITH MINERALS) tablet Take by mouth. Centrum Silver for Women  Take 1 tablet every other day    . NON FORMULARY Sustain Eye Drops: Use three times daily    . pantoprazole (PROTONIX) 40 MG tablet One daily to reduce stomach acid and protect the esophagus 90 tablet 4   No current facility-administered medications for  this visit.    Allergies  Allergen Reactions  . Latex     To face  . Erythromycin   . Macrodantin [Nitrofurantoin Macrocrystal]   . Penicillins   . Septra [Sulfamethoxazole-Trimethoprim]   . Sulfa Antibiotics   . Trimethoprim     History   Social History  . Marital Status: Widowed    Spouse Name: N/A    Number of Children: 2  . Years of Education: college   Occupational History  .      retired   Social History Main Topics  . Smoking status: Never Smoker   . Smokeless tobacco: Never Used  . Alcohol Use: No  . Drug Use: No  . Sexual Activity: Not on file   Other Topics Concern  . Not on file   Social History Narrative   Patient lives at home with her daughter [name]) - Widowed.   Retired.   EducationNurse, mental health.   Right handed.   Caffeine- None some times tea very rare.     Review of Systems: General: negative for chills, fever, night sweats or weight changes.  Cardiovascular: negative for chest pain, dyspnea on exertion, edema, orthopnea, palpitations, paroxysmal nocturnal dyspnea or shortness of breath Dermatological: negative for rash Respiratory: negative for cough or wheezing Urologic: negative for hematuria Abdominal: negative for nausea, vomiting, diarrhea, bright red blood per rectum, melena, or hematemesis Neurologic: negative for visual changes, syncope, or dizziness All other systems reviewed and are otherwise negative except  as noted above.    Blood pressure 120/84, pulse 68, height 5\' 2"  (1.575 m), weight 124 lb 8 oz (56.473 kg).  General appearance: alert and no distress Neck: no adenopathy, no carotid bruit, no JVD, supple, symmetrical, trachea midline and thyroid not enlarged, symmetric, no tenderness/mass/nodules Lungs: clear to auscultation bilaterally Heart: regular rate and rhythm, S1, S2 normal, no murmur, click, rub or gallop Extremities: extremities normal, atraumatic, no cyanosis or edema  EKG normal sinus rhythm at 68 without ST or  T-wave changes. I personally reviewed this EKG  ASSESSMENT AND PLAN:   Palpitations History of palpitations in the past, on low-dose beta blocker (metoprolol) who is currently asymptomatic. Continue current medications      Lorretta Harp MD [facility] 03/25/2014 9:37 AM

## 2014-03-27 DIAGNOSIS — Z1283 Encounter for screening for malignant neoplasm of skin: Secondary | ICD-10-CM | POA: Diagnosis not present

## 2014-03-27 DIAGNOSIS — L259 Unspecified contact dermatitis, unspecified cause: Secondary | ICD-10-CM | POA: Diagnosis not present

## 2014-04-01 ENCOUNTER — Other Ambulatory Visit: Payer: Self-pay | Admitting: Cardiovascular Disease

## 2014-04-01 NOTE — Telephone Encounter (Signed)
Rx refill sent to patient pharmacy   

## 2014-04-15 DIAGNOSIS — Z1231 Encounter for screening mammogram for malignant neoplasm of breast: Secondary | ICD-10-CM | POA: Diagnosis not present

## 2014-04-15 LAB — HM MAMMOGRAPHY

## 2014-04-16 ENCOUNTER — Encounter: Payer: Self-pay | Admitting: *Deleted

## 2014-04-27 ENCOUNTER — Encounter: Payer: Self-pay | Admitting: Internal Medicine

## 2014-05-25 DIAGNOSIS — M25562 Pain in left knee: Secondary | ICD-10-CM | POA: Diagnosis not present

## 2014-05-25 DIAGNOSIS — S8001XA Contusion of right knee, initial encounter: Secondary | ICD-10-CM | POA: Diagnosis not present

## 2014-05-30 ENCOUNTER — Other Ambulatory Visit: Payer: Self-pay | Admitting: Internal Medicine

## 2014-06-02 ENCOUNTER — Other Ambulatory Visit: Payer: Self-pay | Admitting: *Deleted

## 2014-06-02 MED ORDER — CLOPIDOGREL BISULFATE 75 MG PO TABS: ORAL_TABLET | ORAL | Status: DC

## 2014-06-02 NOTE — Telephone Encounter (Signed)
[facility]  

## 2014-06-22 ENCOUNTER — Other Ambulatory Visit: Payer: Medicare Other

## 2014-06-22 DIAGNOSIS — I10 Essential (primary) hypertension: Secondary | ICD-10-CM | POA: Diagnosis not present

## 2014-06-22 DIAGNOSIS — E785 Hyperlipidemia, unspecified: Secondary | ICD-10-CM

## 2014-06-22 DIAGNOSIS — E039 Hypothyroidism, unspecified: Secondary | ICD-10-CM | POA: Diagnosis not present

## 2014-06-23 LAB — COMPREHENSIVE METABOLIC PANEL
ALK PHOS: 77 IU/L (ref 39–117)
ALT: 21 IU/L (ref 0–32)
AST: 20 IU/L (ref 0–40)
Albumin/Globulin Ratio: 1.9 (ref 1.1–2.5)
Albumin: 3.9 g/dL (ref 3.5–4.7)
BUN / CREAT RATIO: 35 — AB (ref 11–26)
BUN: 24 mg/dL (ref 8–27)
Bilirubin Total: 0.2 mg/dL (ref 0.0–1.2)
CALCIUM: 9 mg/dL (ref 8.7–10.3)
CHLORIDE: 104 mmol/L (ref 97–108)
CO2: 25 mmol/L (ref 18–29)
Creatinine, Ser: 0.69 mg/dL (ref 0.57–1.00)
GFR calc Af Amer: 93 mL/min/{1.73_m2} (ref >59)
GFR calc non Af Amer: 81 mL/min/{1.73_m2} (ref >59)
Globulin, Total: 2.1 g/dL (ref 1.5–4.5)
Glucose: 85 mg/dL (ref 65–99)
Potassium: 4.1 mmol/L (ref 3.5–5.2)
SODIUM: 142 mmol/L (ref 134–144)
Total Protein: 6 g/dL (ref 6.0–8.5)

## 2014-06-23 LAB — CBC WITH DIFFERENTIAL
BASOS: 1 %
Basophils Absolute: 0 10*3/uL (ref 0.0–0.2)
EOS ABS: 0.2 10*3/uL (ref 0.0–0.4)
Eos: 3 %
HCT: 37 % (ref 34.0–46.6)
Hemoglobin: 12 g/dL (ref 11.1–15.9)
IMMATURE GRANULOCYTES: 0 %
Immature Grans (Abs): 0 10*3/uL (ref 0.0–0.1)
Lymphocytes Absolute: 2.1 10*3/uL (ref 0.7–3.1)
Lymphs: 33 %
MCH: 29.5 pg (ref 26.6–33.0)
MCHC: 32.4 g/dL (ref 31.5–35.7)
MCV: 91 fL (ref 79–97)
MONOS ABS: 0.4 10*3/uL (ref 0.1–0.9)
Monocytes: 6 %
Neutrophils Absolute: 3.7 10*3/uL (ref 1.4–7.0)
Neutrophils Relative %: 57 %
RBC: 4.07 x10E6/uL (ref 3.77–5.28)
RDW: 15.1 % (ref 12.3–15.4)
WBC: 6.4 10*3/uL (ref 3.4–10.8)

## 2014-06-23 LAB — LIPID PANEL
CHOLESTEROL TOTAL: 184 mg/dL (ref 100–199)
Chol/HDL Ratio: 2.2 ratio units (ref 0.0–4.4)
HDL: 83 mg/dL (ref >39)
LDL Calculated: 86 mg/dL (ref 0–99)
TRIGLYCERIDES: 75 mg/dL (ref 0–149)
VLDL CHOLESTEROL CAL: 15 mg/dL (ref 5–40)

## 2014-06-23 LAB — TSH: TSH: 4.11 u[IU]/mL (ref 0.450–4.500)

## 2014-06-24 ENCOUNTER — Encounter: Payer: Self-pay | Admitting: Internal Medicine

## 2014-06-24 ENCOUNTER — Ambulatory Visit (INDEPENDENT_AMBULATORY_CARE_PROVIDER_SITE_OTHER): Payer: Medicare Other | Admitting: Internal Medicine

## 2014-06-24 VITALS — BP 128/80 | HR 63 | Temp 96.7°F | Ht 62.0 in | Wt 121.5 lb

## 2014-06-24 DIAGNOSIS — N393 Stress incontinence (female) (male): Secondary | ICD-10-CM

## 2014-06-24 DIAGNOSIS — E785 Hyperlipidemia, unspecified: Secondary | ICD-10-CM | POA: Diagnosis not present

## 2014-06-24 DIAGNOSIS — I10 Essential (primary) hypertension: Secondary | ICD-10-CM | POA: Diagnosis not present

## 2014-06-24 DIAGNOSIS — G5621 Lesion of ulnar nerve, right upper limb: Secondary | ICD-10-CM

## 2014-06-24 DIAGNOSIS — R2689 Other abnormalities of gait and mobility: Secondary | ICD-10-CM

## 2014-06-24 DIAGNOSIS — R29818 Other symptoms and signs involving the nervous system: Secondary | ICD-10-CM

## 2014-06-24 DIAGNOSIS — Z23 Encounter for immunization: Secondary | ICD-10-CM | POA: Diagnosis not present

## 2014-06-24 DIAGNOSIS — B070 Plantar wart: Secondary | ICD-10-CM | POA: Insufficient documentation

## 2014-06-24 DIAGNOSIS — M7121 Synovial cyst of popliteal space [Baker], right knee: Secondary | ICD-10-CM

## 2014-06-24 DIAGNOSIS — M7120 Synovial cyst of popliteal space [Baker], unspecified knee: Secondary | ICD-10-CM | POA: Insufficient documentation

## 2014-06-24 DIAGNOSIS — E039 Hypothyroidism, unspecified: Secondary | ICD-10-CM | POA: Diagnosis not present

## 2014-06-24 DIAGNOSIS — M810 Age-related osteoporosis without current pathological fracture: Secondary | ICD-10-CM | POA: Diagnosis not present

## 2014-06-24 HISTORY — DX: Synovial cyst of popliteal space (Baker), unspecified knee: M71.20

## 2014-06-24 MED ORDER — PNEUMOCOCCAL 13-VAL CONJ VACC IM SUSP: 0.5000 mL | INTRAMUSCULAR | Status: DC

## 2014-06-24 NOTE — Progress Notes (Signed)
Patient ID: [name], female   DOB: 1929-05-08, 81 y.o.   MRN: [id number]    HISTORY AND PHYSICAL  Location:    [facility]   [name]:   OFFICE  Extended Emergency Contact Information Primary Emergency Contact: [name] Address: [address]           [place], [place] [address] [name] of [place] Phone: [phone] Work Phone: [phone] Relation: Daughter Secondary Emergency Contact: [name] of [employer] Phone: [phone] Relation: Son  Ambulance person Complaint  Patient presents with  . Annual Exam    Yearly check-up, discuss labs(copy printed), EKG completed 03/25/14  . Immunizations    Discuss need for Prevnar 13    HPI:  Right thumb at MCP is slightly swollen and is tender when she pinches. Has not tried any medications.  Both shoulders are painful with movement. Wants to use alfalfa to help the pain.  Tender wart on the left heel.  [name] cleaned wax out of her ears recently.  Saw [name] 03/25/14  Balance Class at [facility] improved her balance. [place] 05/25/14 on a rug in her room. Hurt the right knee. Saw [name]. Xray showed arthritis.  Using a total dose of 5,000 units of Vitamin D.  Inguinal hernia is a little larger but it always reduces when she lays down.  Needs Prevnar  Past Medical History  Diagnosis Date  . Chest pain at rest 01/07/2012  . Pain in joint, hand   . Unspecified hypothyroidism   . Inguinal hernia without mention of obstruction or gangrene, unilateral or unspecified, (not specified as recurrent)   . Senile osteoporosis   . Unspecified vitamin D deficiency   . Female stress incontinence   . Macular degeneration (senile) of retina, unspecified   . Unspecified glaucoma   . Osteoarthrosis, unspecified whether generalized or localized, unspecified site   . Unspecified essential hypertension   . Internal hemorrhoids without mention  of complication   . Lumbago   . Reflux esophagitis   . Displacement of cervical intervertebral disc without myelopathy   . Other and unspecified hyperlipidemia   . Cervicalgia   . Scoliosis (and kyphoscoliosis), idiopathic   . Diverticulosis of colon (without mention of hemorrhage)   . Palpitations   . Skin disorder   . Thyroid disease   . Unruptured popliteal cyst 06/24/2014    Right knee     Past Surgical History  Procedure Laterality Date  . Tonsillectomy  1937  . Foot surgery  august 2013    Doran Durand, MD  . Abdominal hysterectomy  1975    Dr Mallie Mussel  . Appendectomy  1988  . Eye surgery Bilateral 2009    cataract removed right eye, Dr Charise Killian  . Tooth extraction  09/16/13    Dr Carlos American  . Nm myoview ltd      negative  . Doppler echocardiography    . Cardiolite myocardial perfusion study      Patient Care Team: Estill Dooms, MD as PCP - General (Internal Medicine) Winfield Cunas., MD as Consulting Physician (Gastroenterology) Myrlene Broker, MD as Attending Physician (Urology) Lorretta Harp, MD as Consulting Physician (Cardiology) Rozetta Nunnery, MD as Consulting Physician (Otolaryngology) Trisha Mangle, DDS (Dentistry) Rob Hickman, MD as Consulting Physician (Ophthalmology)  History   Social History  . Marital Status: Widowed    Spouse Name: N/A  . Number  of Children: 2  . Years of Education: college   Occupational History  .      retired   Social History Main Topics  . Smoking status: Never Smoker   . Smokeless tobacco: Never Used  . Alcohol Use: No  . Drug Use: No  . Sexual Activity: Not on file   Other Topics Concern  . Not on file   Social History Narrative   Patient lives at home with her daughter [name]) - Widowed.   Retired.   EducationNurse, mental health.   Right handed.   Caffeine- None some times tea very rare.     reports that she has never smoked. She has never used smokeless tobacco. She reports that she does not  drink alcohol or use illicit drugs.  Family History  Problem Relation Age of Onset  . CAD Mother   . Parkinson's disease Mother   . Cancer Father     colon cancer  . Parkinson's disease Father   . Stroke Maternal Grandmother    Family Status  Relation Status Death Age  . Mother Deceased 37  . Father Deceased 70  . Brother Alive   . Daughter Alive   . Son Alive     Immunization History  Administered Date(s) Administered  . DTaP 02/21/1996, 07/23/2003, 03/20/2010  . Influenza Whole 02/24/2011, 02/12/2012  . Influenza,inj,Quad PF,36+ Mos 02/20/2013, 02/10/2014  . Pneumococcal Polysaccharide-23 04/12/1998  . Zoster 09/15/2005    Allergies  Allergen Reactions  . Latex     To face  . Erythromycin   . Macrodantin [Nitrofurantoin Macrocrystal]   . Penicillins   . Septra [Sulfamethoxazole-Trimethoprim]   . Sulfa Antibiotics   . Trimethoprim     Medications: Patient's Medications  New Prescriptions   No medications on file  Previous Medications   BIMATOPROST (LUMIGAN) 0.01 % SOLN    Place 1 drop into both eyes at bedtime.   CALCIUM ELEMENTAL AS CARBONATE (BARIATRIC TUMS ULTRA) 400 MG TABLET    Chew 1,000 mg by mouth 2 (two) times daily.   CHOLECALCIFEROL (VITAMIN D3) 2000 UNITS TABS    Take 2,000 Units/day by mouth 2 (two) times daily.   CLOPIDOGREL (PLAVIX) 75 MG TABLET    Take one tablet by mouth once daily to prevent stroke   HYDROCORTISONE CREAM 1 %    Apply 1 application topically as needed for itching.   METOPROLOL SUCCINATE (TOPROL-XL) 25 MG 24 HR TABLET    One daily to regulate heart and control BP   MULTIPLE VITAMINS-MINERALS (MULTIVITAMIN ADULTS 50+ PO)    Take by mouth daily.   MULTIPLE VITAMINS-MINERALS (PRESERVISION AREDS 2 PO)    Take by mouth 2 (two) times daily.   PANTOPRAZOLE (PROTONIX) 40 MG TABLET    TAKE 1 TABLET DAILY.   POLYETHYL GLYCOL-PROPYL GLYCOL (SYSTANE OP)    Place 1 drop into both eyes 2 (two) times daily. May use up to 3 times if needed.    Modified Medications   No medications on file  Discontinued Medications   MULTIPLE VITAMINS-MINERALS (ICAPS) TABS    Take by mouth. Take 2 capsules twice daily   MULTIPLE VITAMINS-MINERALS (MULTIVITAMIN WITH MINERALS) TABLET    Take by mouth. Centrum Silver for Women  Take 1 tablet every other day    Review of Systems  Constitutional: Negative.   HENT: Negative.   Eyes:       Left cataract. Dry macular degeneration.  Respiratory: Negative.   Cardiovascular: Negative.   Gastrointestinal: Negative.   Endocrine:  Negative.   Genitourinary:       Stress incontinence.  Musculoskeletal:       Some joint pains. Bilateral shoulder discomfort.  Skin: Negative.   Allergic/Immunologic: Negative.   Neurological: Negative.   Hematological: Negative.   Psychiatric/Behavioral: Negative.     Filed Vitals:   06/24/14 1300  BP: 128/80  Pulse: 63  Temp: 96.7 F (35.9 C)  TempSrc: Oral  Height: 5\' 2"  (1.575 m)  Weight: 121 lb 8 oz (55.112 kg)  SpO2: 99%   Body mass index is 22.22 kg/(m^2).  Physical Exam  Constitutional: She is oriented to person, place, and time. She appears well-developed and well-nourished. No distress.  HENT:  Head: Normocephalic and atraumatic.  Right Ear: External ear normal.  Left Ear: External ear normal.  Nose: Nose normal.  Mouth/Throat: Oropharynx is clear and moist.  Eyes: Conjunctivae are normal. Pupils are equal, round, and reactive to light. Left eye exhibits no discharge.  Neck: No JVD present. No tracheal deviation present. No thyromegaly present.  Cardiovascular: Normal rate, regular rhythm, normal heart sounds and intact distal pulses.  Exam reveals no gallop and no friction rub.   No murmur heard. Pulmonary/Chest: No respiratory distress. She has no wheezes. She has no rales. She exhibits no tenderness.  Abdominal: She exhibits no distension and no mass. There is no tenderness.  Right inguinal hernia may be a little larger.  Musculoskeletal:  She exhibits no edema or tenderness.  FROM at shoulders.  Lymphadenopathy:    She has no cervical adenopathy.  Neurological: She is alert and oriented to person, place, and time. She has normal reflexes. No cranial nerve deficit. Coordination normal.  Skin: No rash noted. No erythema. No pallor.  Psychiatric: She has a normal mood and affect. Her behavior is normal. Judgment and thought content normal.     Labs reviewed: Appointment on 06/22/2014  Component Date Value Ref Range Status  . WBC 06/22/2014 6.4  3.4 - 10.8 x10E3/uL Final  . RBC 06/22/2014 4.07  3.77 - 5.28 x10E6/uL Final  . Hemoglobin 06/22/2014 12.0  11.1 - 15.9 g/dL Final  . HCT 06/22/2014 37.0  34.0 - 46.6 % Final  . MCV 06/22/2014 91  79 - 97 fL Final  . MCH 06/22/2014 29.5  26.6 - 33.0 pg Final  . MCHC 06/22/2014 32.4  31.5 - 35.7 g/dL Final  . RDW 06/22/2014 15.1  12.3 - 15.4 % Final  . Neutrophils Relative % 06/22/2014 57   Final  . Lymphs 06/22/2014 33   Final  . Monocytes 06/22/2014 6   Final  . Eos 06/22/2014 3   Final  . Basos 06/22/2014 1   Final  . Neutrophils Absolute 06/22/2014 3.7  1.4 - 7.0 x10E3/uL Final  . Lymphocytes Absolute 06/22/2014 2.1  0.7 - 3.1 x10E3/uL Final  . Monocytes Absolute 06/22/2014 0.4  0.1 - 0.9 x10E3/uL Final  . Eosinophils Absolute 06/22/2014 0.2  0.0 - 0.4 x10E3/uL Final  . Basophils Absolute 06/22/2014 0.0  0.0 - 0.2 x10E3/uL Final  . Immature Granulocytes 06/22/2014 0   Final  . Immature Grans (Abs) 06/22/2014 0.0  0.0 - 0.1 x10E3/uL Final  . Glucose 06/22/2014 85  65 - 99 mg/dL Final  . BUN 06/22/2014 24  8 - 27 mg/dL Final  . Creatinine, Ser 06/22/2014 0.69  0.57 - 1.00 mg/dL Final  . GFR calc non Af Amer 06/22/2014 81  >59 mL/min/1.73 Final  . GFR calc Af Amer 06/22/2014 93  >59 mL/min/1.73 Final  .  BUN/Creatinine Ratio 06/22/2014 35* 11 - 26 Final  . Sodium 06/22/2014 142  134 - 144 mmol/L Final  . Potassium 06/22/2014 4.1  3.5 - 5.2 mmol/L Final  . Chloride  06/22/2014 104  97 - 108 mmol/L Final  . CO2 06/22/2014 25  18 - 29 mmol/L Final  . Calcium 06/22/2014 9.0  8.7 - 10.3 mg/dL Final  . Total Protein 06/22/2014 6.0  6.0 - 8.5 g/dL Final  . Albumin 06/22/2014 3.9  3.5 - 4.7 g/dL Final  . Globulin, Total 06/22/2014 2.1  1.5 - 4.5 g/dL Final  . Albumin/Globulin Ratio 06/22/2014 1.9  1.1 - 2.5 Final  . BILIRUBIN TOTAL 06/22/2014 0.2  0.0 - 1.2 mg/dL Final  . Alkaline Phosphatase 06/22/2014 77  39 - 117 IU/L Final  . AST 06/22/2014 20  0 - 40 IU/L Final  . ALT 06/22/2014 21  0 - 32 IU/L Final  . Cholesterol, Total 06/22/2014 184  100 - 199 mg/dL Final  . Triglycerides 06/22/2014 75  0 - 149 mg/dL Final  . HDL 06/22/2014 83  >39 mg/dL Final   Comment: According to ATP-III Guidelines, HDL-C >59 mg/dL is considered a negative risk factor for CHD.   Marland Kitchen VLDL Cholesterol Cal 06/22/2014 15  5 - 40 mg/dL Final  . LDL Calculated 06/22/2014 86  0 - 99 mg/dL Final  . Chol/HDL Ratio 06/22/2014 2.2  0.0 - 4.4 ratio units Final   Comment:                                   T. Chol/HDL Ratio                                             Men  Women                               1/2 Avg.Risk  3.4    3.3                                   Avg.Risk  5.0    4.4                                2X Avg.Risk  9.6    7.1                                3X Avg.Risk 23.4   11.0   . TSH 06/22/2014 4.110  0.450 - 4.500 uIU/mL Final  Abstract on 04/16/2014  Component Date Value Ref Range Status  . HM Mammogram 04/15/2014 [facility] Mammogram: Negative   Final     Assessment/Plan  1. Balance problem Continue exercises  2. Essential hypertension Controlled  3. Hypothyroidism, unspecified hypothyroidism type Compensated  4. Unruptured popliteal cyst, right Unchanged  5. Hyperlipidemia Controlled  6. Female stress incontinence Continue to wear incontinence pads  7. Need for prophylactic vaccination against Streptococcus pneumoniae (pneumococcus) - Pneumococcal  conjugate vaccine 13-valent  8. Plantar wart of left foot I was able to debride some of this. There was a small  amount of bleeding. Advised the patient to get over-the-counter solutions to place daily on the plantar wart.

## 2014-06-24 NOTE — Progress Notes (Signed)
Passed the clock drawing

## 2014-06-24 NOTE — Progress Notes (Deleted)
Patient ID: [name], female   DOB: September 08, 1928, 81 y.o.   MRN: [id number]    Facility  [facility]    [name]:   OFFICE   Allergies  Allergen Reactions  . Latex     To face  . Erythromycin   . Macrodantin [Nitrofurantoin Macrocrystal]   . Penicillins   . Septra [Sulfamethoxazole-Trimethoprim]   . Sulfa Antibiotics   . Trimethoprim     Chief Complaint  Patient presents with  . Annual Exam    Yearly check-up, discuss labs(copy printed), EKG completed 03/25/14  . Immunizations    Discuss need for Prevnar 13    HPI:  ***  Medications: Patient's Medications  New Prescriptions   No medications on file  Previous Medications   BIMATOPROST (LUMIGAN) 0.01 % SOLN    Place 1 drop into both eyes at bedtime.   CALCIUM ELEMENTAL AS CARBONATE (BARIATRIC TUMS ULTRA) 400 MG TABLET    Chew 1,000 mg by mouth 2 (two) times daily.   CHOLECALCIFEROL (VITAMIN D3) 2000 UNITS TABS    Take 2,000 Units/day by mouth 2 (two) times daily.   CLOPIDOGREL (PLAVIX) 75 MG TABLET    Take one tablet by mouth once daily to prevent stroke   HYDROCORTISONE CREAM 1 %    Apply 1 application topically as needed for itching.   METOPROLOL SUCCINATE (TOPROL-XL) 25 MG 24 HR TABLET    One daily to regulate heart and control BP   MULTIPLE VITAMINS-MINERALS (MULTIVITAMIN ADULTS 50+ PO)    Take by mouth daily.   MULTIPLE VITAMINS-MINERALS (PRESERVISION AREDS 2 PO)    Take by mouth 2 (two) times daily.   PANTOPRAZOLE (PROTONIX) 40 MG TABLET    TAKE 1 TABLET DAILY.   POLYETHYL GLYCOL-PROPYL GLYCOL (SYSTANE OP)    Place 1 drop into both eyes 2 (two) times daily. May use up to 3 times if needed.  Modified Medications   No medications on file  Discontinued Medications   MULTIPLE VITAMINS-MINERALS (ICAPS) TABS    Take by mouth. Take 2 capsules twice daily   MULTIPLE VITAMINS-MINERALS (MULTIVITAMIN WITH MINERALS) TABLET    Take by mouth. Centrum Silver for Women  Take 1 tablet every other day     Review of  Systems  Filed Vitals:   06/24/14 1300  BP: 128/80  Pulse: 63  Temp: 96.7 F (35.9 C)  TempSrc: Oral  Height: 5\' 2"  (1.575 m)  Weight: 121 lb 8 oz (55.112 kg)  SpO2: 99%   Body mass index is 22.22 kg/(m^2).  Physical Exam   Labs reviewed: Appointment on 06/22/2014  Component Date Value Ref Range Status  . WBC 06/22/2014 6.4  3.4 - 10.8 x10E3/uL Final  . RBC 06/22/2014 4.07  3.77 - 5.28 x10E6/uL Final  . Hemoglobin 06/22/2014 12.0  11.1 - 15.9 g/dL Final  . HCT 06/22/2014 37.0  34.0 - 46.6 % Final  . MCV 06/22/2014 91  79 - 97 fL Final  . MCH 06/22/2014 29.5  26.6 - 33.0 pg Final  . MCHC 06/22/2014 32.4  31.5 - 35.7 g/dL Final  . RDW 06/22/2014 15.1  12.3 - 15.4 % Final  . Neutrophils Relative % 06/22/2014 57   Final  . Lymphs 06/22/2014 33   Final  . Monocytes 06/22/2014 6   Final  . Eos 06/22/2014 3   Final  . Basos 06/22/2014 1   Final  . Neutrophils Absolute 06/22/2014 3.7  1.4 - 7.0 x10E3/uL Final  . Lymphocytes Absolute 06/22/2014  2.1  0.7 - 3.1 x10E3/uL Final  . Monocytes Absolute 06/22/2014 0.4  0.1 - 0.9 x10E3/uL Final  . Eosinophils Absolute 06/22/2014 0.2  0.0 - 0.4 x10E3/uL Final  . Basophils Absolute 06/22/2014 0.0  0.0 - 0.2 x10E3/uL Final  . Immature Granulocytes 06/22/2014 0   Final  . Immature Grans (Abs) 06/22/2014 0.0  0.0 - 0.1 x10E3/uL Final  . Glucose 06/22/2014 85  65 - 99 mg/dL Final  . BUN 06/22/2014 24  8 - 27 mg/dL Final  . Creatinine, Ser 06/22/2014 0.69  0.57 - 1.00 mg/dL Final  . GFR calc non Af Amer 06/22/2014 81  >59 mL/min/1.73 Final  . GFR calc Af Amer 06/22/2014 93  >59 mL/min/1.73 Final  . BUN/Creatinine Ratio 06/22/2014 35* 11 - 26 Final  . Sodium 06/22/2014 142  134 - 144 mmol/L Final  . Potassium 06/22/2014 4.1  3.5 - 5.2 mmol/L Final  . Chloride 06/22/2014 104  97 - 108 mmol/L Final  . CO2 06/22/2014 25  18 - 29 mmol/L Final  . Calcium 06/22/2014 9.0  8.7 - 10.3 mg/dL Final  . Total Protein 06/22/2014 6.0  6.0 - 8.5 g/dL Final   . Albumin 06/22/2014 3.9  3.5 - 4.7 g/dL Final  . Globulin, Total 06/22/2014 2.1  1.5 - 4.5 g/dL Final  . Albumin/Globulin Ratio 06/22/2014 1.9  1.1 - 2.5 Final  . BILIRUBIN TOTAL 06/22/2014 0.2  0.0 - 1.2 mg/dL Final  . Alkaline Phosphatase 06/22/2014 77  39 - 117 IU/L Final  . AST 06/22/2014 20  0 - 40 IU/L Final  . ALT 06/22/2014 21  0 - 32 IU/L Final  . Cholesterol, Total 06/22/2014 184  100 - 199 mg/dL Final  . Triglycerides 06/22/2014 75  0 - 149 mg/dL Final  . HDL 06/22/2014 83  >39 mg/dL Final   Comment: According to ATP-III Guidelines, HDL-C >59 mg/dL is considered a negative risk factor for CHD.   Marland Kitchen VLDL Cholesterol Cal 06/22/2014 15  5 - 40 mg/dL Final  . LDL Calculated 06/22/2014 86  0 - 99 mg/dL Final  . Chol/HDL Ratio 06/22/2014 2.2  0.0 - 4.4 ratio units Final   Comment:                                   T. Chol/HDL Ratio                                             Men  Women                               1/2 Avg.Risk  3.4    3.3                                   Avg.Risk  5.0    4.4                                2X Avg.Risk  9.6    7.1  3X Avg.Risk 23.4   11.0   . TSH 06/22/2014 4.110  0.450 - 4.500 uIU/mL Final  Abstract on 04/16/2014  Component Date Value Ref Range Status  . HM Mammogram 04/15/2014 [facility] Mammogram: Negative   Final     Assessment/Plan

## 2014-07-06 ENCOUNTER — Other Ambulatory Visit: Payer: Self-pay | Admitting: Internal Medicine

## 2014-07-30 DIAGNOSIS — Z961 Presence of intraocular lens: Secondary | ICD-10-CM | POA: Diagnosis not present

## 2014-07-30 DIAGNOSIS — T1512XA Foreign body in conjunctival sac, left eye, initial encounter: Secondary | ICD-10-CM | POA: Diagnosis not present

## 2014-07-30 DIAGNOSIS — H40013 Open angle with borderline findings, low risk, bilateral: Secondary | ICD-10-CM | POA: Diagnosis not present

## 2014-08-04 ENCOUNTER — Encounter: Payer: Self-pay | Admitting: Internal Medicine

## 2014-08-14 DIAGNOSIS — L820 Inflamed seborrheic keratosis: Secondary | ICD-10-CM | POA: Diagnosis not present

## 2014-08-14 DIAGNOSIS — B070 Plantar wart: Secondary | ICD-10-CM | POA: Diagnosis not present

## 2014-08-14 DIAGNOSIS — B078 Other viral warts: Secondary | ICD-10-CM | POA: Diagnosis not present

## 2014-08-14 DIAGNOSIS — L821 Other seborrheic keratosis: Secondary | ICD-10-CM | POA: Diagnosis not present

## 2014-12-23 ENCOUNTER — Ambulatory Visit: Payer: Medicare Other | Admitting: Internal Medicine

## 2014-12-30 ENCOUNTER — Encounter: Payer: Self-pay | Admitting: Internal Medicine

## 2014-12-30 ENCOUNTER — Ambulatory Visit (INDEPENDENT_AMBULATORY_CARE_PROVIDER_SITE_OTHER): Payer: Medicare Other | Admitting: Internal Medicine

## 2014-12-30 VITALS — BP 134/80 | HR 62 | Temp 97.8°F | Ht 62.0 in | Wt 124.4 lb

## 2014-12-30 DIAGNOSIS — L84 Corns and callosities: Secondary | ICD-10-CM | POA: Diagnosis not present

## 2014-12-30 DIAGNOSIS — G5621 Lesion of ulnar nerve, right upper limb: Secondary | ICD-10-CM

## 2014-12-30 DIAGNOSIS — R29818 Other symptoms and signs involving the nervous system: Secondary | ICD-10-CM | POA: Diagnosis not present

## 2014-12-30 DIAGNOSIS — B070 Plantar wart: Secondary | ICD-10-CM | POA: Diagnosis not present

## 2014-12-30 DIAGNOSIS — R002 Palpitations: Secondary | ICD-10-CM

## 2014-12-30 DIAGNOSIS — K4090 Unilateral inguinal hernia, without obstruction or gangrene, not specified as recurrent: Secondary | ICD-10-CM | POA: Diagnosis not present

## 2014-12-30 DIAGNOSIS — I10 Essential (primary) hypertension: Secondary | ICD-10-CM | POA: Diagnosis not present

## 2014-12-30 DIAGNOSIS — M7121 Synovial cyst of popliteal space [Baker], right knee: Secondary | ICD-10-CM | POA: Diagnosis not present

## 2014-12-30 DIAGNOSIS — K219 Gastro-esophageal reflux disease without esophagitis: Secondary | ICD-10-CM

## 2014-12-30 DIAGNOSIS — E039 Hypothyroidism, unspecified: Secondary | ICD-10-CM | POA: Diagnosis not present

## 2014-12-30 DIAGNOSIS — E785 Hyperlipidemia, unspecified: Secondary | ICD-10-CM | POA: Diagnosis not present

## 2014-12-30 DIAGNOSIS — R2689 Other abnormalities of gait and mobility: Secondary | ICD-10-CM

## 2014-12-30 MED ORDER — METOPROLOL SUCCINATE ER 25 MG PO TB24: ORAL_TABLET | ORAL | Status: DC

## 2014-12-30 MED ORDER — PANTOPRAZOLE SODIUM 40 MG PO TBEC: DELAYED_RELEASE_TABLET | ORAL | Status: DC

## 2014-12-30 NOTE — Patient Instructions (Signed)
Try Zantac (ranitidine) in place of the Pantoprazole (Protonix).

## 2014-12-30 NOTE — Progress Notes (Signed)
Patient ID: [name], female   DOB: 1928-08-23, 81 y.o.   MRN: [id number]    Facility  [facility]    [name]:   OFFICE    Allergies  Allergen Reactions  . Latex     To face  . Erythromycin   . Macrodantin [Nitrofurantoin Macrocrystal]   . Penicillins   . Septra [Sulfamethoxazole-Trimethoprim]   . Sulfa Antibiotics   . Trimethoprim     Chief Complaint  Patient presents with  . Medical Management of Chronic Issues    Medical management of Chronic Issues. 6 Month Follow up    HPI:  Dr. Kathrin Penner says she has dry macular degeneration. Also has glaucoma.  Dr. Nevada Crane took wart off the left leg in April 2016. Has a scr on the mid left thigh.  Has dry and thin skin. Bruises easily. Bruise on the left upper arm.  Only July 23rd, she had pain n the left kidney area. Has gone and has not come back.  Dr. Oletta Lamas sending her Hemoccult cards.   Plantar wart on the left heel. Corn on the left 5th MT distal head.  Swelling of the left foot while at the beach last week. Better now.  Left knee pain this morning after sleeping all night. Better since she applied Morflex.  Popliteal cyst of the right knee. Not painful.  Painful right shoulder. Pain radiates down the right arm. No change in grip strength. It is not as strong as in the past due to thumb pains.  Had a mild sore throat yesterday but it is better today.   Has trip planned to [place] in Oct 2016.   Medications: Patient's Medications  New Prescriptions   No medications on file  Previous Medications   BIMATOPROST (LUMIGAN) 0.01 % SOLN    Place 1 drop into both eyes at bedtime.   CALCIUM ELEMENTAL AS CARBONATE (BARIATRIC TUMS ULTRA) 400 MG TABLET    Chew 1,000 mg by mouth 2 (two) times daily.   CHOLECALCIFEROL (VITAMIN D3) 2000 UNITS TABS    Take 2,000 Units/day by mouth 2 (two) times daily.   CLOPIDOGREL (PLAVIX) 75 MG TABLET    Take one tablet by mouth once daily to prevent stroke   HYDROCORTISONE CREAM  1 %    Apply 1 application topically as needed for itching.   METOPROLOL SUCCINATE (TOPROL-XL) 25 MG 24 HR TABLET    TAKE 1 TABLET ONCE TO REGULATE HEART AND CONTROL BLOOD PRESSURE.   MULTIPLE VITAMINS-MINERALS (CENTRUM SILVER ULTRA WOMENS PO)    Take one tablet by mouth once daily for supplement   MULTIPLE VITAMINS-MINERALS (PRESERVISION AREDS 2 PO)    Take by mouth 2 (two) times daily.   PANTOPRAZOLE (PROTONIX) 40 MG TABLET    TAKE 1 TABLET DAILY.   POLYETHYL GLYCOL-PROPYL GLYCOL (SYSTANE OP)    Place 1 drop into both eyes 2 (two) times daily. May use up to 3 times if needed.  Modified Medications   No medications on file  Discontinued Medications   MULTIPLE VITAMINS-MINERALS (MULTIVITAMIN ADULTS 50+ PO)    Take by mouth daily.     Review of Systems  Constitutional: Negative.   HENT: Negative.   Eyes:       Left cataract. Dry macular degeneration.  Respiratory: Negative.   Cardiovascular: Negative.   Gastrointestinal: Negative.   Endocrine: Negative.   Genitourinary:       Stress incontinence.  Musculoskeletal:       Some joint pains. Bilateral shoulder  discomfort.  Skin: Negative.   Allergic/Immunologic: Negative.   Neurological: Negative.   Hematological: Negative.   Psychiatric/Behavioral: Negative.     Filed Vitals:   12/30/14 1143  BP: 134/80  Pulse: 62  Temp: 97.8 F (36.6 C)  TempSrc: Oral  Height: 5\' 2"  (1.575 m)  Weight: 124 lb 6.4 oz (56.427 kg)   Body mass index is 22.75 kg/(m^2).  Physical Exam  Constitutional: She is oriented to person, place, and time. She appears well-developed and well-nourished. No distress.  HENT:  Head: Normocephalic and atraumatic.  Right Ear: External ear normal.  Left Ear: External ear normal.  Nose: Nose normal.  Mouth/Throat: Oropharynx is clear and moist.  Eyes: Conjunctivae are normal. Pupils are equal, round, and reactive to light. Left eye exhibits no discharge.  Neck: No JVD present. No tracheal deviation present.  No thyromegaly present.  Cardiovascular: Normal rate, regular rhythm, normal heart sounds and intact distal pulses.  Exam reveals no gallop and no friction rub.   No murmur heard. Pulmonary/Chest: No respiratory distress. She has no wheezes. She has no rales. She exhibits no tenderness.  Abdominal: She exhibits no distension and no mass. There is no tenderness.  Right inguinal hernia unchanged.  Musculoskeletal: She exhibits no edema or tenderness.  FROM at shoulders.  Lymphadenopathy:    She has no cervical adenopathy.  Neurological: She is alert and oriented to person, place, and time. She has normal reflexes. No cranial nerve deficit. Coordination normal.  Skin: No rash noted. No erythema. No pallor.  Psychiatric: She has a normal mood and affect. Her behavior is normal. Judgment and thought content normal.     Labs reviewed: No visits with results within 3 Month(s) from this visit. Latest known visit with results is:  Appointment on 06/22/2014  Component Date Value Ref Range Status  . WBC 06/22/2014 6.4  3.4 - 10.8 x10E3/uL Final  . RBC 06/22/2014 4.07  3.77 - 5.28 x10E6/uL Final  . Hemoglobin 06/22/2014 12.0  11.1 - 15.9 g/dL Final  . HCT 06/22/2014 37.0  34.0 - 46.6 % Final  . MCV 06/22/2014 91  79 - 97 fL Final  . MCH 06/22/2014 29.5  26.6 - 33.0 pg Final  . MCHC 06/22/2014 32.4  31.5 - 35.7 g/dL Final  . RDW 06/22/2014 15.1  12.3 - 15.4 % Final  . Neutrophils Relative % 06/22/2014 57   Final  . Lymphs 06/22/2014 33   Final  . Monocytes 06/22/2014 6   Final  . Eos 06/22/2014 3   Final  . Basos 06/22/2014 1   Final  . Neutrophils Absolute 06/22/2014 3.7  1.4 - 7.0 x10E3/uL Final  . Lymphocytes Absolute 06/22/2014 2.1  0.7 - 3.1 x10E3/uL Final  . Monocytes Absolute 06/22/2014 0.4  0.1 - 0.9 x10E3/uL Final  . Eosinophils Absolute 06/22/2014 0.2  0.0 - 0.4 x10E3/uL Final  . Basophils Absolute 06/22/2014 0.0  0.0 - 0.2 x10E3/uL Final  . Immature Granulocytes 06/22/2014 0    Final  . Immature Grans (Abs) 06/22/2014 0.0  0.0 - 0.1 x10E3/uL Final  . Glucose 06/22/2014 85  65 - 99 mg/dL Final  . BUN 06/22/2014 24  8 - 27 mg/dL Final  . Creatinine, Ser 06/22/2014 0.69  0.57 - 1.00 mg/dL Final  . GFR calc non Af Amer 06/22/2014 81  >59 mL/min/1.73 Final  . GFR calc Af Amer 06/22/2014 93  >59 mL/min/1.73 Final  . BUN/Creatinine Ratio 06/22/2014 35* 11 - 26 Final  . Sodium 06/22/2014 142  134 - 144 mmol/L Final  . Potassium 06/22/2014 4.1  3.5 - 5.2 mmol/L Final  . Chloride 06/22/2014 104  97 - 108 mmol/L Final  . CO2 06/22/2014 25  18 - 29 mmol/L Final  . Calcium 06/22/2014 9.0  8.7 - 10.3 mg/dL Final  . Total Protein 06/22/2014 6.0  6.0 - 8.5 g/dL Final  . Albumin 06/22/2014 3.9  3.5 - 4.7 g/dL Final  . Globulin, Total 06/22/2014 2.1  1.5 - 4.5 g/dL Final  . Albumin/Globulin Ratio 06/22/2014 1.9  1.1 - 2.5 Final  . Bilirubin Total 06/22/2014 0.2  0.0 - 1.2 mg/dL Final  . Alkaline Phosphatase 06/22/2014 77  39 - 117 IU/L Final  . AST 06/22/2014 20  0 - 40 IU/L Final  . ALT 06/22/2014 21  0 - 32 IU/L Final  . Cholesterol, Total 06/22/2014 184  100 - 199 mg/dL Final  . Triglycerides 06/22/2014 75  0 - 149 mg/dL Final  . HDL 06/22/2014 83  >39 mg/dL Final   Comment: According to ATP-III Guidelines, HDL-C >59 mg/dL is considered a negative risk factor for CHD.   Marland Kitchen VLDL Cholesterol Cal 06/22/2014 15  5 - 40 mg/dL Final  . LDL Calculated 06/22/2014 86  0 - 99 mg/dL Final  . Chol/HDL Ratio 06/22/2014 2.2  0.0 - 4.4 ratio units Final   Comment:                                   T. Chol/HDL Ratio                                             Men  Women                               1/2 Avg.Risk  3.4    3.3                                   Avg.Risk  5.0    4.4                                2X Avg.Risk  9.6    7.1                                3X Avg.Risk 23.4   11.0   . TSH 06/22/2014 4.110  0.450 - 4.500 uIU/mL Final     Assessment/Plan  1. Essential  hypertension - metoprolol succinate (TOPROL-XL) 25 MG 24 hr tablet; One daily to regulate heart rate and control BP  Dispense: 90 tablet; Refill: 3 - Comprehensive metabolic panel; Future  2. Hyperlipidemia - Lipid panel; Future  3. Hypothyroidism, unspecified hypothyroidism type - TSH; Future  4. Palpitations Although she reports palpitations, there is no physiologic disturbance. We will simply continue to monitor.  5. Corn 2 Painful corns were sharply debrided from the left foot during the office visit.  6. Right popliteal cyst Although easily palpable, it is not causing any distress.  7. Gastroesophageal reflux disease without esophagitis She is going to try  ranitidine, but wants her prescription for pantoprazole refilled - pantoprazole (PROTONIX) 40 MG tablet; One daily to reduce stomach acid  Dispense: 90 tablet; Refill: 3  8. Balance problem Patient continues to be cautious with ambulation  9. Unruptured popliteal cyst, right Palpable without distress  10. Plantar wart of left foot Heel plantar wart was left undisturbed during this visit. I advised patient to get over-the-counter wart removal liquid.  11. Unilateral inguinal hernia without obstruction or gangrene, recurrence not specified Continue to monitor.

## 2015-01-12 ENCOUNTER — Telehealth: Payer: Self-pay | Admitting: Internal Medicine

## 2015-01-12 DIAGNOSIS — M7550 Bursitis of unspecified shoulder: Secondary | ICD-10-CM

## 2015-01-12 NOTE — Telephone Encounter (Signed)
[name] called into the office today and wanted to know about her appointment for Dr. Onnie Graham [name] stated she saw you a few weeks ago and you were going to refer her to [facility] to see Dr. Onnie Graham  Please advise. No referral has been placed

## 2015-01-13 DIAGNOSIS — Z961 Presence of intraocular lens: Secondary | ICD-10-CM | POA: Diagnosis not present

## 2015-01-13 DIAGNOSIS — H40013 Open angle with borderline findings, low risk, bilateral: Secondary | ICD-10-CM | POA: Diagnosis not present

## 2015-01-13 DIAGNOSIS — H04123 Dry eye syndrome of bilateral lacrimal glands: Secondary | ICD-10-CM | POA: Diagnosis not present

## 2015-01-13 DIAGNOSIS — H3531 Nonexudative age-related macular degeneration: Secondary | ICD-10-CM | POA: Diagnosis not present

## 2015-01-15 NOTE — Telephone Encounter (Signed)
Order placed

## 2015-01-15 NOTE — Telephone Encounter (Signed)
Go ahead with referral to Dr. supple for her shoulder discomfort.

## 2015-01-21 DIAGNOSIS — Z1211 Encounter for screening for malignant neoplasm of colon: Secondary | ICD-10-CM | POA: Diagnosis not present

## 2015-02-03 DIAGNOSIS — M19011 Primary osteoarthritis, right shoulder: Secondary | ICD-10-CM | POA: Diagnosis not present

## 2015-02-19 ENCOUNTER — Ambulatory Visit (INDEPENDENT_AMBULATORY_CARE_PROVIDER_SITE_OTHER): Payer: Medicare Other | Admitting: *Deleted

## 2015-02-19 DIAGNOSIS — Z23 Encounter for immunization: Secondary | ICD-10-CM

## 2015-03-03 DIAGNOSIS — M19011 Primary osteoarthritis, right shoulder: Secondary | ICD-10-CM | POA: Diagnosis not present

## 2015-03-04 ENCOUNTER — Telehealth: Payer: Self-pay

## 2015-03-04 ENCOUNTER — Telehealth: Payer: Self-pay | Admitting: Cardiovascular Disease

## 2015-03-04 NOTE — Telephone Encounter (Signed)
Received a fax from [facility] [phone] )  for surgery  clearance Left message on machine for patient to return call when available

## 2015-03-04 NOTE — Telephone Encounter (Signed)
Received records from [facility] for appointment on 03/30/15 with Dr Gwenlyn Found.  Records given to [facility] (medical records) for Dr Kennon Holter schedule on 03/30/15. lp

## 2015-03-04 NOTE — Telephone Encounter (Signed)
Appointment made for patient for 03-10-15 at 3:15 pm for pre op surgery

## 2015-03-10 ENCOUNTER — Encounter: Payer: Self-pay | Admitting: Internal Medicine

## 2015-03-10 ENCOUNTER — Ambulatory Visit (INDEPENDENT_AMBULATORY_CARE_PROVIDER_SITE_OTHER): Payer: Medicare Other | Admitting: Internal Medicine

## 2015-03-10 VITALS — BP 132/92 | HR 65 | Temp 97.4°F | Resp 20 | Ht 62.0 in | Wt 127.8 lb

## 2015-03-10 DIAGNOSIS — M810 Age-related osteoporosis without current pathological fracture: Secondary | ICD-10-CM

## 2015-03-10 DIAGNOSIS — K219 Gastro-esophageal reflux disease without esophagitis: Secondary | ICD-10-CM

## 2015-03-10 DIAGNOSIS — I10 Essential (primary) hypertension: Secondary | ICD-10-CM | POA: Diagnosis not present

## 2015-03-10 DIAGNOSIS — M25511 Pain in right shoulder: Secondary | ICD-10-CM | POA: Diagnosis not present

## 2015-03-10 DIAGNOSIS — R002 Palpitations: Secondary | ICD-10-CM | POA: Diagnosis not present

## 2015-03-10 NOTE — Progress Notes (Signed)
Patient ID: [name], female   DOB: [birth year], 81 y.o.   MRN: [id number]    Facility  [facility]    [name]:   OFFICE    Allergies  Allergen Reactions  . Latex     To face  . Erythromycin   . Macrodantin [Nitrofurantoin Macrocrystal]   . Penicillins   . Septra [Sulfamethoxazole-Trimethoprim]   . Sulfa Antibiotics   . Trimethoprim     Chief Complaint  Patient presents with  . Medical Management of Chronic Issues    Preopt. clearence    HPI:  Right shoulder pain - Dr. Onnie Graham has proposed total shoulder replacement. Patient is somewhat ambivalent about this today. She understands the potential benefits. She seems to understand potential risks as well. She is undecided as to whether her current debility related to her shoulder pain is sufficient to justify the surgery. She has a wedding and the family coming up in June 2017. Her current understanding is that the rehabilitation time may take up 6 months. She understands that she needs to make a decision regarding her potential surgery on the shoulder soon.  Palpitations - asymptomatic  Senile osteoporosis - patient worries about what influence the osteoporosis may have in regards to healing and rehabilitation.  Essential hypertension - controlled  Gastroesophageal reflux disease without esophagitis - occasional heartburn for which she uses Tums    Medications: Patient's Medications  New Prescriptions   No medications on file  Previous Medications   BIMATOPROST (LUMIGAN) 0.01 % SOLN    Place 1 drop into both eyes at bedtime.   CALCIUM ELEMENTAL AS CARBONATE (BARIATRIC TUMS ULTRA) 400 MG TABLET    Chew 1,000 mg by mouth 2 (two) times daily.   CHOLECALCIFEROL (VITAMIN D3) 2000 UNITS TABS    Take 2,000 Units/day by mouth 2 (two) times daily.   CLOPIDOGREL (PLAVIX) 75 MG TABLET    Take one tablet by mouth once daily to prevent stroke   HYDROCORTISONE CREAM 1 %    Apply 1 application topically as needed for  itching.   METOPROLOL SUCCINATE (TOPROL-XL) 25 MG 24 HR TABLET    One daily to regulate heart rate and control BP   MULTIPLE VITAMINS-MINERALS (CENTRUM SILVER ULTRA WOMENS PO)    Take one tablet by mouth once daily for supplement   MULTIPLE VITAMINS-MINERALS (PRESERVISION AREDS 2 PO)    Take by mouth 2 (two) times daily.   PANTOPRAZOLE (PROTONIX) 40 MG TABLET    One daily to reduce stomach acid   POLYETHYL GLYCOL-PROPYL GLYCOL (SYSTANE OP)    Place 1 drop into both eyes 2 (two) times daily. May use up to 3 times if needed.  Modified Medications   No medications on file  Discontinued Medications   No medications on file    Review of Systems  Constitutional: Negative for fever, chills, diaphoresis, activity change, appetite change, fatigue and unexpected weight change.  HENT: Negative for congestion, ear discharge, ear pain, hearing loss, postnasal drip, rhinorrhea, sore throat, tinnitus, trouble swallowing and voice change.   Eyes: Negative for pain, redness, itching and visual disturbance.       Left cataract. Dry macular degeneration.  Respiratory: Negative for cough, choking, shortness of breath and wheezing.   Cardiovascular: Negative for chest pain, palpitations and leg swelling.  Gastrointestinal: Negative for nausea, abdominal pain, diarrhea, constipation and abdominal distention.       Occasional reflux and heartburn for which she uses Tums.  Endocrine: Negative for cold intolerance, heat  intolerance, polydipsia, polyphagia and polyuria.  Genitourinary: Negative for dysuria, urgency, frequency, hematuria, flank pain, vaginal discharge, difficulty urinating and pelvic pain.       Stress incontinence.  Musculoskeletal: Negative for myalgias, back pain, arthralgias, gait problem, neck pain and neck stiffness.       Some joint pains. Bilateral shoulder discomfort; worse in the right shoulder.  Skin: Negative.  Negative for color change, pallor and rash.  Neurological: Negative for  dizziness, tremors, seizures, syncope, weakness, numbness and headaches.  Hematological: Negative for adenopathy. Does not bruise/bleed easily.  Psychiatric/Behavioral: Negative for suicidal ideas, hallucinations, behavioral problems, confusion, sleep disturbance, dysphoric mood and agitation. The patient is not nervous/anxious and is not hyperactive.     Filed Vitals:   03/10/15 1556  BP: 132/92  Pulse: 65  Temp: 97.4 F (36.3 C)  TempSrc: Oral  Resp: 20  Height: '5\' 2"'  (1.575 m)  Weight: 127 lb 12.8 oz (57.97 kg)  SpO2: 98%   Body mass index is 23.37 kg/(m^2).  Physical Exam  Constitutional: She is oriented to person, place, and time. She appears well-developed and well-nourished. No distress.  HENT:  Head: Normocephalic and atraumatic.  Right Ear: External ear normal.  Left Ear: External ear normal.  Nose: Nose normal.  Mouth/Throat: Oropharynx is clear and moist.  Eyes: Conjunctivae are normal. Pupils are equal, round, and reactive to light. Left eye exhibits no discharge.  Neck: No JVD present. No tracheal deviation present. No thyromegaly present.  Cardiovascular: Normal rate, regular rhythm, normal heart sounds and intact distal pulses.  Exam reveals no gallop and no friction rub.   No murmur heard. Pulmonary/Chest: No respiratory distress. She has no wheezes. She has no rales. She exhibits no tenderness.  Abdominal: She exhibits no distension and no mass. There is no tenderness.  Right inguinal hernia unchanged.  Musculoskeletal: She exhibits no edema or tenderness.  Limited ability to raise arms above his shoulders.  Lymphadenopathy:    She has no cervical adenopathy.  Neurological: She is alert and oriented to person, place, and time. She has normal reflexes. No cranial nerve deficit. Coordination normal.  Skin: No rash noted. No erythema. No pallor.  Psychiatric: She has a normal mood and affect. Her behavior is normal. Judgment and thought content normal.    Labs  reviewed: Lab Summary Latest Ref Rng 06/22/2014 05/02/2013 05/01/2013 05/01/2013  Hemoglobin 11.1 - 15.9 g/dL 12.0 (None) 14.6 13.4  Hematocrit 34.0 - 46.6 % 37.0 (None) 43.0 40.2  White count 3.4 - 10.8 x10E3/uL 6.4 (None) (None) 7.1  Platelet count 150 - 400 K/uL (None) (None) (None) 236  Sodium 134 - 144 mmol/L 142 (None) 141 139  Potassium 3.5 - 5.2 mmol/L 4.1 (None) 3.8 3.8  Calcium 8.7 - 10.3 mg/dL 9.0 (None) (None) 9.4  Phosphorus - (None) (None) (None) (None)  Creatinine 0.57 - 1.00 mg/dL 0.69 (None) 0.90 0.82  AST 0 - 40 IU/L 20 (None) (None) 24  Alk Phos 39 - 117 IU/L 77 (None) (None) 50  Bilirubin 0.0 - 1.2 mg/dL 0.2 (None) (None) 0.2(L)  Glucose 65 - 99 mg/dL 85 (None) 155(H) 153(H)  Cholesterol 0 - 200 mg/dL (None) 160 (None) (None)  HDL cholesterol >39 mg/dL 83 88 (None) (None)  Triglycerides 0 - 149 mg/dL 75 63 (None) (None)  LDL Direct - (None) (None) (None) (None)  LDL Calc 0 - 99 mg/dL 86 59 (None) (None)  Total protein 6.0 - 8.3 g/dL (None) (None) (None) 7.2  Albumin 3.5 - 4.7 g/dL  3.9 (None) (None) 3.7   Lab Results  Component Value Date   TSH 4.110 06/22/2014   Lab Results  Component Value Date   BUN 24 06/22/2014   Lab Results  Component Value Date   HGBA1C 5.3 05/02/2013   03/10/15 EKG: rate62. NSR. Normal  Assessment/Plan  1. Right shoulder pain Patient is medically cleared for potential surgery on her right shoulder. She seems ambivalent today in regards to whether she actually wants to go through the surgery.  2. Palpitations Asymptomatic. Normal EKG.  3. Senile osteoporosis I advised the patient that I do not think that the decision to have surgery or not should be based on her history of osteoporosis. I think her level of discomfort and disability should be the primary influence on her decision.  4. Essential hypertension Controlled  5. Gastroesophageal reflux disease without esophagitis Occasional use of Tums for reflux.

## 2015-03-11 NOTE — Addendum Note (Signed)
Addended byMarisa Cyphers C on: 03/11/2015 08:33 AM   Modules accepted: Orders

## 2015-03-30 ENCOUNTER — Ambulatory Visit (INDEPENDENT_AMBULATORY_CARE_PROVIDER_SITE_OTHER): Payer: Medicare Other | Admitting: Cardiovascular Disease

## 2015-03-30 ENCOUNTER — Encounter: Payer: Self-pay | Admitting: Cardiovascular Disease

## 2015-03-30 VITALS — BP 112/82 | HR 70 | Ht 62.0 in | Wt 129.0 lb

## 2015-03-30 DIAGNOSIS — E785 Hyperlipidemia, unspecified: Secondary | ICD-10-CM | POA: Diagnosis not present

## 2015-03-30 DIAGNOSIS — I10 Essential (primary) hypertension: Secondary | ICD-10-CM | POA: Diagnosis not present

## 2015-03-30 NOTE — Assessment & Plan Note (Signed)
History of hypertension blood pressure measures at 112/82.. She is on metoprolol. Continue current meds at current dosing

## 2015-03-30 NOTE — Progress Notes (Signed)
03/30/2015 [name]   1929/02/23  BD:[id number]  Primary Physician GREEN, Viviann Spare, MD Primary Cardiologist: Lorretta Harp MD Renae Gloss   HPI:  The patient is an 81 year old thin appearing widowed Caucasian female mother of 2, grandmother of 2 grandchildren who I last saw 1 years ago. Her only symptoms at that time were palpitations on a low-dose beta blocker. She also had a history of GERD. She was admitted to [facility] on January 06, 2013 with chest pain. She ruled out for myocardial infarction. She had a Myoview stress test which was normal. She had no recurrent symptoms.she also had TIA type symptoms last year and was diagnosed with a small "cerebral aneurysm "conservative medical therapy was recommended. She follows up with a neurologist and has been asymptomatic. She does have left shoulder issues and apparently since seen Dr. Onnie Graham was recommended total shoulder replacement.   Current Outpatient Prescriptions  Medication Sig Dispense Refill  . bimatoprost (LUMIGAN) 0.01 % SOLN Place 1 drop into both eyes at bedtime.    . calcium elemental as carbonate (BARIATRIC TUMS ULTRA) 400 MG tablet Chew 500 mg by mouth daily.     . Cholecalciferol (VITAMIN D3) 2000 UNITS TABS Take 2,000 Units/day by mouth 2 (two) times daily.    . clopidogrel (PLAVIX) 75 MG tablet Take one tablet by mouth once daily to prevent stroke 30 tablet 11  . hydrocortisone cream 1 % Apply 1 application topically as needed for itching.    . metoprolol succinate (TOPROL-XL) 25 MG 24 hr tablet One daily to regulate heart rate and control BP 90 tablet 3  . Multiple Vitamins-Minerals (CENTRUM SILVER ULTRA WOMENS PO) Take one tablet by mouth once daily for supplement    . Multiple Vitamins-Minerals (PRESERVISION AREDS 2 PO) Take by mouth 2 (two) times daily.    . pantoprazole (PROTONIX) 40 MG tablet One daily to reduce stomach acid 90 tablet 3  . Polyethyl Glycol-Propyl Glycol (SYSTANE OP)  Place 1 drop into both eyes 2 (two) times daily. May use up to 3 times if needed.     No current facility-administered medications for this visit.    Allergies  Allergen Reactions  . Latex     To face  . Erythromycin   . Macrodantin [Nitrofurantoin Macrocrystal]   . Penicillins   . Septra [Sulfamethoxazole-Trimethoprim]   . Sulfa Antibiotics   . Trimethoprim     Social History   Social History  . Marital Status: Widowed    Spouse Name: N/A  . Number of Children: 2  . Years of Education: college   Occupational History  .      retired   Social History Main Topics  . Smoking status: Never Smoker   . Smokeless tobacco: Never Used  . Alcohol Use: No  . Drug Use: No  . Sexual Activity: Not on file   Other Topics Concern  . Not on file   Social History Narrative   Patient lives at home with her daughter [name]) - Widowed.   Retired.   EducationNurse, mental health.   Right handed.   Caffeine- None some times tea very rare.     Review of Systems: General: negative for chills, fever, night sweats or weight changes.  Cardiovascular: negative for chest pain, dyspnea on exertion, edema, orthopnea, palpitations, paroxysmal nocturnal dyspnea or shortness of breath Dermatological: negative for rash Respiratory: negative for cough or wheezing Urologic: negative for hematuria Abdominal: negative for nausea, vomiting, diarrhea, bright red  blood per rectum, melena, or hematemesis Neurologic: negative for visual changes, syncope, or dizziness All other systems reviewed and are otherwise negative except as noted above.    Blood pressure 112/82, pulse 70, height 5\' 2"  (1.575 m), weight 129 lb (58.514 kg).  General appearance: alert and no distress Neck: no adenopathy, no carotid bruit, no JVD, supple, symmetrical, trachea midline and thyroid not enlarged, symmetric, no tenderness/mass/nodules Lungs: clear to auscultation bilaterally Heart: regular rate and rhythm, S1, S2 normal, no  murmur, click, rub or gallop Extremities: extremities normal, atraumatic, no cyanosis or edema  EKG not performed today  ASSESSMENT AND PLAN:   Hyperlipidemia History of hyperlipidemia not on statin therapy followed by her PCP  Essential hypertension History of hypertension blood pressure measures at 112/82.. She is on metoprolol. Continue current meds at current dosing      Lorretta Harp MD [facility], [facility] 03/30/2015 10:48 AM

## 2015-03-30 NOTE — Assessment & Plan Note (Signed)
History of hyperlipidemia not on statin therapy followed by her PCP 

## 2015-03-30 NOTE — Patient Instructions (Signed)

## 2015-04-22 ENCOUNTER — Ambulatory Visit (INDEPENDENT_AMBULATORY_CARE_PROVIDER_SITE_OTHER): Payer: Medicare Other | Admitting: Internal Medicine

## 2015-04-22 ENCOUNTER — Encounter: Payer: Self-pay | Admitting: Internal Medicine

## 2015-04-22 VITALS — BP 146/78 | HR 63 | Temp 97.3°F | Resp 20 | Ht 62.0 in | Wt 127.4 lb

## 2015-04-22 DIAGNOSIS — N6310 Unspecified lump in the right breast, unspecified quadrant: Secondary | ICD-10-CM

## 2015-04-22 DIAGNOSIS — Z1231 Encounter for screening mammogram for malignant neoplasm of breast: Secondary | ICD-10-CM

## 2015-04-22 DIAGNOSIS — N63 Unspecified lump in breast: Secondary | ICD-10-CM

## 2015-04-22 DIAGNOSIS — M129 Arthropathy, unspecified: Secondary | ICD-10-CM

## 2015-04-22 DIAGNOSIS — M19011 Primary osteoarthritis, right shoulder: Secondary | ICD-10-CM

## 2015-04-22 DIAGNOSIS — N644 Mastodynia: Secondary | ICD-10-CM

## 2015-04-22 NOTE — Progress Notes (Signed)
Patient ID: [name], female   DOB: 02-Nov-1928, 81 y.o.   MRN: [id number]   Location: [place] Provider: Rexene Edison. Mariea Clonts, D.O., C.M.D.  Goals of Care: Advanced Directive information Does patient have an advance directive?: Yes  Chief Complaint  Patient presents with  . Acute Visit    Having pain around the left edge of the right breast started about 3-4 weeks ago    HPI: Patient is a 81 y.o. female seen in the office today for an acute visit with right breast pain for the past 3-4 wks. 3-4 wks ago on a Tuesday, she had exercise that Monday, noticed pain in her medial right breast.  Got a little worse, then got a little better Also has some pain just above the breast Has a bad right shoulder and needs a replacement--hurts some of the time and does not use medication Needs a mammogram also anyway No personal breast problems/cancer and none in her family either  Review of Systems:  Review of Systems  Constitutional: Negative for fever, chills, weight loss and malaise/fatigue.  Genitourinary:       Right breast pain  Musculoskeletal: Negative for myalgias, back pain, joint pain, falls and neck pain.  Skin: Negative for itching and rash.  Neurological: Negative for weakness.    Past Medical History  Diagnosis Date  . Chest pain at rest 01/07/2012  . Pain in joint, hand   . Unspecified hypothyroidism   . Inguinal hernia without mention of obstruction or gangrene, unilateral or unspecified, (not specified as recurrent)   . Senile osteoporosis   . Unspecified vitamin D deficiency   . Female stress incontinence   . Macular degeneration (senile) of retina, unspecified   . Unspecified glaucoma   . Osteoarthrosis, unspecified whether generalized or localized, unspecified site   . Unspecified essential hypertension   . Internal hemorrhoids without mention of complication   . Lumbago   . Reflux esophagitis   . Displacement of cervical intervertebral disc without  myelopathy   . Other and unspecified hyperlipidemia   . Cervicalgia   . Scoliosis (and kyphoscoliosis), idiopathic   . Diverticulosis of colon (without mention of hemorrhage)   . Palpitations   . Skin disorder   . Thyroid disease   . Unruptured popliteal cyst 06/24/2014    Right knee     Past Surgical History  Procedure Laterality Date  . Tonsillectomy  1937  . Foot surgery  august 2013    Doran Durand, MD  . Abdominal hysterectomy  1975    Dr Mallie Mussel  . Appendectomy  1988  . Eye surgery Bilateral 2009    cataract removed right eye, Dr Charise Killian  . Tooth extraction  09/16/13    Dr Carlos American  . Nm myoview ltd      negative  . Doppler echocardiography    . Cardiolite myocardial perfusion study      Allergies  Allergen Reactions  . Latex     To face  . Erythromycin   . Macrodantin [Nitrofurantoin Macrocrystal]   . Penicillins   . Septra [Sulfamethoxazole-Trimethoprim]   . Sulfa Antibiotics   . Trimethoprim       Medication List       This list is accurate as of: 04/22/15  3:46 PM.  Always use your most recent med list.               bimatoprost 0.01 % Soln  Commonly known as:  LUMIGAN  Place 1 drop into both  eyes at bedtime.     calcium elemental as carbonate 400 MG chewable tablet  Commonly known as:  BARIATRIC TUMS ULTRA  Chew 500 mg by mouth daily.     clopidogrel 75 MG tablet  Commonly known as:  PLAVIX  Take one tablet by mouth once daily to prevent stroke     hydrocortisone cream 1 %  Apply 1 application topically as needed for itching.     metoprolol succinate 25 MG 24 hr tablet  Commonly known as:  TOPROL-XL  One daily to regulate heart rate and control BP     pantoprazole 40 MG tablet  Commonly known as:  PROTONIX  One daily to reduce stomach acid     PRESERVISION AREDS 2 PO  Take by mouth 2 (two) times daily.     CENTRUM SILVER ULTRA WOMENS PO  Take one tablet by mouth once daily for supplement     SYSTANE OP  Place 1 drop into both eyes 2 (two)  times daily. May use up to 3 times if needed.     Vitamin D3 2000 UNITS Tabs  Take 2,000 Units/day by mouth 2 (two) times daily.        Health Maintenance  Topic Date Due  . INFLUENZA VACCINE  12/14/2015  . TETANUS/TDAP  03/20/2020  . DEXA SCAN  Completed  . ZOSTAVAX  Completed  . PNA vac Low Risk Adult  Completed    Physical Exam: Filed Vitals:   04/22/15 1532  BP: 146/78  Pulse: 63  Temp: 97.3 F (36.3 C)  TempSrc: Oral  Resp: 20  Height: 5\' 2"  (1.575 m)  Weight: 127 lb 6.4 oz (57.788 kg)  SpO2: 97%   Body mass index is 23.3 kg/(m^2). Physical Exam  Constitutional: She is oriented to person, place, and time. No distress.  Cardiovascular: Normal rate, regular rhythm and normal heart sounds.   Pulmonary/Chest: Effort normal and breath sounds normal. Right breast exhibits tenderness. Right breast exhibits no inverted nipple, no mass, no nipple discharge and no skin change. Left breast exhibits no inverted nipple, no mass, no nipple discharge, no skin change and no tenderness.  Medical right breast upper quadrant  Musculoskeletal: She exhibits tenderness.  Of right AC joint area and rotator cuff insertion with decreased abduction right arm  Lymphadenopathy:    She has no axillary adenopathy.  Neurological: She is alert and oriented to person, place, and time.  Skin: Skin is warm and dry. No rash noted. No erythema.    Labs reviewed: Basic Metabolic Panel:  Recent Labs  06/22/14 0845  NA 142  K 4.1  CL 104  CO2 25  GLUCOSE 85  BUN 24  CREATININE 0.69  CALCIUM 9.0  TSH 4.110   Liver Function Tests:  Recent Labs  06/22/14 0845  AST 20  ALT 21  ALKPHOS 77  BILITOT 0.2  PROT 6.0  ALBUMIN 3.9   No results for input(s): LIPASE, AMYLASE in the last 8760 hours. No results for input(s): AMMONIA in the last 8760 hours. CBC:  Recent Labs  06/22/14 0845  WBC 6.4  NEUTROABS 3.7  HGB 12.0  HCT 37.0  MCV 91   Lipid Panel:  Recent Labs   06/22/14 0845  CHOL 184  HDL 83  LDLCALC 86  TRIG 75  CHOLHDL 2.2   Lab Results  Component Value Date   HGBA1C 5.3 05/02/2013    Assessment/Plan 1. Painful lumpy right breast - noted some thickening of tissue in right upper outer  quadrant with tenderness moreso of right upper inner quadrant - MM Digital Diagnostic Unilat R; Future  2. Arthritis of right shoulder region -could possibly be causing some radiating pain to the right breast but some tissue changes also were palpable so diagnostic mammo for right breast was ordered  3. Encounter for screening mammogram for breast cancer - MM Digital Diagnostic Unilat R; Future - MM Digital Screening Unilat L; Future  Labs/tests ordered:   Orders Placed This Encounter  Procedures  . MM Digital Diagnostic Unilat R    Standing Status: Future     Number of Occurrences:      Standing Expiration Date: 06/22/2016    Order Specific Question:  Reason for Exam (SYMPTOM  OR DIAGNOSIS REQUIRED)    Answer:  right medial breast pain, but thickening of tissue in upper outer quadrant on exam    Order Specific Question:  Preferred imaging location?    Answer:  [facility]  . MM Digital Screening Unilat L    Standing Status: Future     Number of Occurrences:      Standing Expiration Date: 06/22/2016    Scheduling Instructions:     diagnostic    Order Specific Question:  Reason for Exam (SYMPTOM  OR DIAGNOSIS REQUIRED)    Answer:  annual screening mammo    Order Specific Question:  Preferred imaging location?    Answer:  [facility]    Next appt:  Keep 04/29/2015 with Dr. Nyoka Cowden   Anees Vanecek L. Yittel Emrich, D.O. [facility] [address], [place] [address] Cell Phone (Mon-Fri 8am-5pm):  [phone] On Call:  [phone] & follow prompts after 5pm & weekends Office Phone:  [phone] Office Fax:  [phone]

## 2015-04-28 DIAGNOSIS — N644 Mastodynia: Secondary | ICD-10-CM | POA: Diagnosis not present

## 2015-04-28 LAB — HM MAMMOGRAPHY

## 2015-04-29 ENCOUNTER — Encounter: Payer: Self-pay | Admitting: *Deleted

## 2015-05-16 ENCOUNTER — Encounter: Payer: Self-pay | Admitting: Internal Medicine

## 2015-05-20 DIAGNOSIS — M25562 Pain in left knee: Secondary | ICD-10-CM | POA: Diagnosis not present

## 2015-05-20 DIAGNOSIS — M79641 Pain in right hand: Secondary | ICD-10-CM | POA: Diagnosis not present

## 2015-06-03 ENCOUNTER — Other Ambulatory Visit: Payer: Self-pay | Admitting: Internal Medicine

## 2015-06-14 DIAGNOSIS — H01005 Unspecified blepharitis left lower eyelid: Secondary | ICD-10-CM | POA: Diagnosis not present

## 2015-06-14 DIAGNOSIS — H01004 Unspecified blepharitis left upper eyelid: Secondary | ICD-10-CM | POA: Diagnosis not present

## 2015-06-17 DIAGNOSIS — S61411A Laceration without foreign body of right hand, initial encounter: Secondary | ICD-10-CM | POA: Diagnosis not present

## 2015-06-18 ENCOUNTER — Other Ambulatory Visit: Payer: Medicare Other

## 2015-06-18 DIAGNOSIS — E039 Hypothyroidism, unspecified: Secondary | ICD-10-CM | POA: Diagnosis not present

## 2015-06-18 DIAGNOSIS — I10 Essential (primary) hypertension: Secondary | ICD-10-CM | POA: Diagnosis not present

## 2015-06-18 DIAGNOSIS — E785 Hyperlipidemia, unspecified: Secondary | ICD-10-CM

## 2015-06-19 LAB — COMPREHENSIVE METABOLIC PANEL
ALT: 17 IU/L (ref 0–32)
AST: 21 IU/L (ref 0–40)
Albumin/Globulin Ratio: 1.7 (ref 1.1–2.5)
Albumin: 4.1 g/dL (ref 3.5–4.7)
Alkaline Phosphatase: 70 IU/L (ref 39–117)
BILIRUBIN TOTAL: 0.4 mg/dL (ref 0.0–1.2)
BUN/Creatinine Ratio: 28 — ABNORMAL HIGH (ref 11–26)
BUN: 18 mg/dL (ref 8–27)
CALCIUM: 9.4 mg/dL (ref 8.7–10.3)
CHLORIDE: 102 mmol/L (ref 96–106)
CO2: 27 mmol/L (ref 18–29)
Creatinine, Ser: 0.64 mg/dL (ref 0.57–1.00)
GFR calc non Af Amer: 82 mL/min/{1.73_m2} (ref >59)
GFR, EST AFRICAN AMERICAN: 95 mL/min/{1.73_m2} (ref >59)
GLUCOSE: 85 mg/dL (ref 65–99)
Globulin, Total: 2.4 g/dL (ref 1.5–4.5)
Potassium: 3.9 mmol/L (ref 3.5–5.2)
Sodium: 142 mmol/L (ref 134–144)
TOTAL PROTEIN: 6.5 g/dL (ref 6.0–8.5)

## 2015-06-19 LAB — LIPID PANEL
Chol/HDL Ratio: 2.2 ratio units (ref 0.0–4.4)
Cholesterol, Total: 185 mg/dL (ref 100–199)
HDL: 85 mg/dL (ref >39)
LDL Calculated: 82 mg/dL (ref 0–99)
Triglycerides: 92 mg/dL (ref 0–149)
VLDL CHOLESTEROL CAL: 18 mg/dL (ref 5–40)

## 2015-06-19 LAB — TSH: TSH: 4.29 u[IU]/mL (ref 0.450–4.500)

## 2015-06-22 ENCOUNTER — Encounter: Payer: Self-pay | Admitting: Internal Medicine

## 2015-06-22 ENCOUNTER — Ambulatory Visit (INDEPENDENT_AMBULATORY_CARE_PROVIDER_SITE_OTHER): Payer: Medicare Other | Admitting: Internal Medicine

## 2015-06-22 VITALS — BP 128/72 | HR 72 | Temp 97.4°F | Resp 20 | Ht 62.0 in | Wt 126.6 lb

## 2015-06-22 DIAGNOSIS — E039 Hypothyroidism, unspecified: Secondary | ICD-10-CM | POA: Diagnosis not present

## 2015-06-22 DIAGNOSIS — M25511 Pain in right shoulder: Secondary | ICD-10-CM | POA: Diagnosis not present

## 2015-06-22 DIAGNOSIS — E785 Hyperlipidemia, unspecified: Secondary | ICD-10-CM | POA: Diagnosis not present

## 2015-06-22 DIAGNOSIS — I10 Essential (primary) hypertension: Secondary | ICD-10-CM | POA: Diagnosis not present

## 2015-06-22 DIAGNOSIS — M810 Age-related osteoporosis without current pathological fracture: Secondary | ICD-10-CM

## 2015-06-22 MED ORDER — CLOPIDOGREL BISULFATE 75 MG PO TABS: ORAL_TABLET | ORAL | Status: DC

## 2015-06-22 NOTE — Patient Instructions (Signed)
Stop Plavix 7 days prior to surgery.

## 2015-06-22 NOTE — Progress Notes (Signed)
Patient ID: [name], female   DOB: 11-Mar-1929, 82 y.o.   MRN: [id number]    HISTORY AND PHYSICAL  Location:    [place]    [name]:   OFFICE  Extended Emergency Contact Information Primary Emergency Contact: [name] Address: [address]           [place],  [address] [name] of [place] Phone: [phone] Work Phone: [phone] Relation: Daughter Secondary Emergency Contact: [place] of [place] Mobile Phone: [phone] Relation: Son  Advanced Directive information Does patient have an advance directive?: Yes, Type of Advance Directive: Living will, Does patient want to make changes to advanced directive?: No - Patient declined  Chief Complaint  Patient presents with  . Annual Exam    HPI:  Saw [name] for approval prior to shoulder surgery. He said that is a big surgery for a woman her age and he would need to do  a stress test. She has put off the surgery.  Did Hemoccult for Dr. Oletta Lamas. They were negative.   Had a painful lump in the right breast in Dec 2016. Dr. Mariea Clonts did mammogram scheduling and she got a good report.   Saw [name] for a swollen left knee. It is better.   Right 5th PIP  Knuckle got swollen after puncture by a rose thorn. Rx with Diclofenac gel. Had appt with Dr. Apolonio Schneiders to remove something from the right 5th knuckle. Scheduled for March 4th. Needs to stop Plavix prior to surgery.  Had some irritation of the left eye recently. Her eye doctor, Tanner, who told her that her oil gland was stoppped up. Using heat and drops and ofloxacin ophth gtts 0.3%.  Has questions about whether she needs to be on some other medication for her OP. Had DEXA in June 2015. Used alendronate greater than 5 years. Currently on calcium and Vit D.  Has questions about Regenerative medication for OA>   Past Medical History  Diagnosis Date  . Chest pain at rest 01/07/2012  . Pain in joint, hand   .  Unspecified hypothyroidism   . Inguinal hernia without mention of obstruction or gangrene, unilateral or unspecified, (not specified as recurrent)   . Senile osteoporosis   . Unspecified vitamin D deficiency   . Female stress incontinence   . Macular degeneration (senile) of retina, unspecified   . Unspecified glaucoma   . Osteoarthrosis, unspecified whether generalized or localized, unspecified site   . Unspecified essential hypertension   . Internal hemorrhoids without mention of complication   . Lumbago   . Reflux esophagitis   . Displacement of cervical intervertebral disc without myelopathy   . Other and unspecified hyperlipidemia   . Cervicalgia   . Scoliosis (and kyphoscoliosis), idiopathic   . Diverticulosis of colon (without mention of hemorrhage)   . Palpitations   . Skin disorder   . Thyroid disease   . Unruptured popliteal cyst 06/24/2014    Right knee     Past Surgical History  Procedure Laterality Date  . Tonsillectomy  1937  . Foot surgery  august 2013    Doran Durand, MD  . Abdominal hysterectomy  1975    Dr Mallie Mussel  . Appendectomy  1988  . Eye surgery Bilateral 2009    cataract removed right eye, Dr Charise Killian  . Tooth extraction  09/16/13    Dr Carlos American  . Nm myoview ltd      negative  . Doppler echocardiography    .  Cardiolite myocardial perfusion study      Patient Care Team: Estill Dooms, MD as PCP - General (Internal Medicine) Laurence Spates, MD as Consulting Physician (Gastroenterology) Myrlene Broker, MD as Attending Physician (Urology) Lorretta Harp, MD as Consulting Physician (Cardiology) Rozetta Nunnery, MD as Consulting Physician (Otolaryngology) Richarda Osmond, DDS (Dentistry) Shon Hough, MD as Consulting Physician (Ophthalmology) Iran Planas, MD as Consulting Physician (Orthopedic Surgery)  Social History   Social History  . Marital Status: Widowed    Spouse Name: N/A  . Number of Children: 2  . Years of Education: college     Occupational History  .      retired   Social History Main Topics  . Smoking status: Never Smoker   . Smokeless tobacco: Never Used  . Alcohol Use: No  . Drug Use: No  . Sexual Activity: Not on file   Other Topics Concern  . Not on file   Social History Narrative   Patient lives at home with her daughter [name]) - Widowed.   Retired.   EducationNurse, mental health.   Right handed.   Caffeine- None some times tea very rare.    reports that she has never smoked. She has never used smokeless tobacco. She reports that she does not drink alcohol or use illicit drugs.  Family History  Problem Relation Age of Onset  . CAD Mother   . Parkinson's disease Mother   . Cancer Father     colon cancer  . Parkinson's disease Father   . Stroke Maternal Grandmother    Family Status  Relation Status Death Age  . Mother Deceased 84  . Father Deceased 74  . Brother Alive   . Daughter Alive   . Son Alive     Immunization History  Administered Date(s) Administered  . Influenza Whole 02/24/2011, 02/12/2012  . Influenza,inj,Quad PF,36+ Mos 02/20/2013, 02/10/2014, 02/19/2015  . Pneumococcal Conjugate-13 06/24/2014  . Pneumococcal Polysaccharide-23 04/12/1998  . Td 02/21/1996, 07/23/2003  . Tdap 03/20/2010  . Zoster 09/15/2005    Allergies  Allergen Reactions  . Latex     To face  . Erythromycin   . Macrodantin [Nitrofurantoin Macrocrystal]   . Penicillins   . Septra [Sulfamethoxazole-Trimethoprim]   . Sulfa Antibiotics   . Trimethoprim     Medications: Patient's Medications  New Prescriptions   No medications on file  Previous Medications   BIMATOPROST (LUMIGAN) 0.01 % SOLN    Place 1 drop into both eyes at bedtime.   CALCIUM ELEMENTAL AS CARBONATE (BARIATRIC TUMS ULTRA) 400 MG TABLET    Chew 500 mg by mouth daily.    CHOLECALCIFEROL (VITAMIN D3) 2000 UNITS TABS    Take 2,000 Units/day by mouth 2 (two) times daily.   CLOPIDOGREL (PLAVIX) 75 MG TABLET    TAKE 1 TABLET ONCE A  DAY TO PREVENT STROKE.   DICLOFENAC SODIUM (VOLTAREN) 1 % GEL    daily as needed.   HYDROCORTISONE CREAM 1 %    Apply 1 application topically as needed for itching.   METOPROLOL SUCCINATE (TOPROL-XL) 25 MG 24 HR TABLET    One daily to regulate heart rate and control BP   MULTIPLE VITAMINS-MINERALS (CENTRUM SILVER ULTRA WOMENS PO)    Take one tablet by mouth once daily for supplement   MULTIPLE VITAMINS-MINERALS (PRESERVISION AREDS 2 PO)    Take by mouth 2 (two) times daily.   OFLOXACIN (OCUFLOX) 0.3 % OPHTHALMIC SOLUTION       PANTOPRAZOLE (PROTONIX) 40  MG TABLET    One daily to reduce stomach acid   POLYETHYL GLYCOL-PROPYL GLYCOL (SYSTANE OP)    Place 1 drop into both eyes 2 (two) times daily. May use up to 3 times if needed.  Modified Medications   No medications on file  Discontinued Medications   No medications on file    Review of Systems  Constitutional: Negative for fever, chills, diaphoresis, activity change, appetite change, fatigue and unexpected weight change.  HENT: Negative for congestion, ear discharge, ear pain, hearing loss, postnasal drip, rhinorrhea, sore throat, tinnitus, trouble swallowing and voice change.   Eyes: Negative for pain, redness, itching and visual disturbance.       Left cataract. Dry macular degeneration.  Respiratory: Negative for cough, choking, shortness of breath and wheezing.   Cardiovascular: Negative for chest pain, palpitations and leg swelling.  Gastrointestinal: Negative for nausea, abdominal pain, diarrhea, constipation and abdominal distention.       Occasional reflux and heartburn for which she uses Tums.  Endocrine: Negative for cold intolerance, heat intolerance, polydipsia, polyphagia and polyuria.  Genitourinary: Negative for dysuria, urgency, frequency, hematuria, flank pain, vaginal discharge, difficulty urinating and pelvic pain.       Stress incontinence.  Musculoskeletal: Negative for myalgias, back pain, arthralgias, gait problem,  neck pain and neck stiffness.       Some joint pains. Bilateral shoulder discomfort; worse in the right shoulder.  Skin: Negative.  Negative for color change, pallor and rash.  Neurological: Negative for dizziness, tremors, seizures, syncope, weakness, numbness and headaches.  Hematological: Negative for adenopathy. Does not bruise/bleed easily.  Psychiatric/Behavioral: Negative for suicidal ideas, hallucinations, behavioral problems, confusion, sleep disturbance, dysphoric mood and agitation. The patient is not nervous/anxious and is not hyperactive.     Filed Vitals:   06/22/15 1447  BP: 128/72  Pulse: 72  Temp: 97.4 F (36.3 C)  TempSrc: Oral  Resp: 20  Height: '5\' 2"'  (1.575 m)  Weight: 126 lb 9.6 oz (57.425 kg)  SpO2: 96%   Body mass index is 23.15 kg/(m^2). Filed Weights   06/22/15 1447  Weight: 126 lb 9.6 oz (57.425 kg)     Physical Exam  Constitutional: She is oriented to person, place, and time. She appears well-developed and well-nourished. No distress.  HENT:  Head: Normocephalic and atraumatic.  Right Ear: External ear normal.  Left Ear: External ear normal.  Nose: Nose normal.  Mouth/Throat: Oropharynx is clear and moist.  Eyes: Conjunctivae are normal. Pupils are equal, round, and reactive to light. Left eye exhibits no discharge.  Neck: No JVD present. No tracheal deviation present. No thyromegaly present.  Cardiovascular: Normal rate, regular rhythm, normal heart sounds and intact distal pulses.  Exam reveals no gallop and no friction rub.   No murmur heard. Pulmonary/Chest: No respiratory distress. She has no wheezes. She has no rales. She exhibits no tenderness.  Abdominal: She exhibits no distension and no mass. There is no tenderness.  Right inguinal hernia unchanged.  Musculoskeletal: She exhibits no edema or tenderness.  Limited ability to raise arms above his shoulders.  Lymphadenopathy:    She has no cervical adenopathy.  Neurological: She is alert  and oriented to person, place, and time. She has normal reflexes. No cranial nerve deficit. Coordination normal.  Skin: No rash noted. No erythema. No pallor.  Psychiatric: She has a normal mood and affect. Her behavior is normal. Judgment and thought content normal.    Labs reviewed: Lab Summary Latest Ref Rng 06/18/2015 06/22/2014  Hemoglobin 11.1 - 15.9 g/dL (None) 12.0  Hematocrit 34.0 - 46.6 % (None) 37.0  White count 3.4 - 10.8 x10E3/uL (None) 6.4  Platelet count - (None) (None)  Sodium 134 - 144 mmol/L 142 142  Potassium 3.5 - 5.2 mmol/L 3.9 4.1  Calcium 8.7 - 10.3 mg/dL 9.4 9.0  Phosphorus - (None) (None)  Creatinine 0.57 - 1.00 mg/dL 0.64 0.69  AST 0 - 40 IU/L 21 20  Alk Phos 39 - 117 IU/L 70 77  Bilirubin 0.0 - 1.2 mg/dL 0.4 0.2  Glucose 65 - 99 mg/dL 85 85  Cholesterol - (None) (None)  HDL cholesterol >39 mg/dL 85 83  Triglycerides 0 - 149 mg/dL 92 75  LDL Direct - (None) (None)  LDL Calc 0 - 99 mg/dL 82 86  Total protein - (None) (None)  Albumin 3.5 - 4.7 g/dL 4.1 3.9   Lab Results  Component Value Date   BUN 18 06/18/2015   Lab Results  Component Value Date   HGBA1C 5.3 05/02/2013   Lab Results  Component Value Date   TSH 4.290 06/18/2015          No results found.   Assessment/Plan  1. Senile osteoporosis Continue Ca++ and  It D. Repeat DEXA in about a year.  2. Essential hypertension *controlled  3. Hyperlipidemia controlled  4. Hypothyroidism, unspecified hypothyroidism type cpompensated  5. Right shoulder pain If she decides to do the shoulder surgery, she will contact DrGwenlyn Found to schedule the stress test first.  6. Gam\nglioin of the right 5th MCP dorsally Approved for surgery

## 2015-07-14 DIAGNOSIS — R2231 Localized swelling, mass and lump, right upper limb: Secondary | ICD-10-CM | POA: Diagnosis not present

## 2015-07-14 DIAGNOSIS — D2111 Benign neoplasm of connective and other soft tissue of right upper limb, including shoulder: Secondary | ICD-10-CM | POA: Diagnosis not present

## 2015-07-15 DIAGNOSIS — H40012 Open angle with borderline findings, low risk, left eye: Secondary | ICD-10-CM | POA: Diagnosis not present

## 2015-07-15 DIAGNOSIS — H40011 Open angle with borderline findings, low risk, right eye: Secondary | ICD-10-CM | POA: Diagnosis not present

## 2015-07-22 DIAGNOSIS — H401122 Primary open-angle glaucoma, left eye, moderate stage: Secondary | ICD-10-CM | POA: Diagnosis not present

## 2015-07-22 DIAGNOSIS — S61411D Laceration without foreign body of right hand, subsequent encounter: Secondary | ICD-10-CM | POA: Diagnosis not present

## 2015-07-22 DIAGNOSIS — H401112 Primary open-angle glaucoma, right eye, moderate stage: Secondary | ICD-10-CM | POA: Diagnosis not present

## 2015-07-22 DIAGNOSIS — H353132 Nonexudative age-related macular degeneration, bilateral, intermediate dry stage: Secondary | ICD-10-CM | POA: Diagnosis not present

## 2015-07-22 DIAGNOSIS — Z4789 Encounter for other orthopedic aftercare: Secondary | ICD-10-CM | POA: Diagnosis not present

## 2015-07-22 DIAGNOSIS — H04123 Dry eye syndrome of bilateral lacrimal glands: Secondary | ICD-10-CM | POA: Diagnosis not present

## 2015-08-19 DIAGNOSIS — S61411D Laceration without foreign body of right hand, subsequent encounter: Secondary | ICD-10-CM | POA: Diagnosis not present

## 2015-08-20 ENCOUNTER — Other Ambulatory Visit: Payer: Self-pay | Admitting: Internal Medicine

## 2015-08-24 ENCOUNTER — Telehealth: Payer: Self-pay | Admitting: *Deleted

## 2015-08-24 NOTE — Telephone Encounter (Signed)
Patient called regarding getting off of her protonix and she has spoken with the pharmacist and they recommended that she take the 20 mg tablet instead and wants a script for this medication. Please Advise!

## 2015-08-25 NOTE — Telephone Encounter (Signed)
I already signed off on a note faxed from the pharmacy to change pantoprazole 20 mg daily.

## 2015-10-14 DIAGNOSIS — M25562 Pain in left knee: Secondary | ICD-10-CM | POA: Diagnosis not present

## 2015-11-08 ENCOUNTER — Telehealth: Payer: Self-pay | Admitting: *Deleted

## 2015-11-08 ENCOUNTER — Encounter: Payer: Self-pay | Admitting: Nurse Practitioner

## 2015-11-08 ENCOUNTER — Ambulatory Visit (INDEPENDENT_AMBULATORY_CARE_PROVIDER_SITE_OTHER): Payer: Medicare Other | Admitting: Nurse Practitioner

## 2015-11-08 VITALS — BP 112/74 | HR 68 | Temp 97.6°F | Resp 17 | Ht 62.0 in | Wt 124.6 lb

## 2015-11-08 DIAGNOSIS — J00 Acute nasopharyngitis [common cold]: Secondary | ICD-10-CM | POA: Diagnosis not present

## 2015-11-08 DIAGNOSIS — H6123 Impacted cerumen, bilateral: Secondary | ICD-10-CM

## 2015-11-08 NOTE — Telephone Encounter (Signed)
Called patient left message on her answering machine regarding her use of the Debrox oil for her ears.  Needed to inform her that she can use debrox 5 drops into ear twice daily for 4 days then to come back for evaluation and to be flushed. I told her that she can call Rudene Re to schedule the re-evaluation of her ears.

## 2015-11-08 NOTE — Progress Notes (Signed)
Patient ID: [name], female   DOB: 03-30-1929, 82 y.o.   MRN: [id number]    PCP: Estill Dooms, MD  Advanced Directive information Does patient have an advance directive?: Yes, Type of Advance Directive: Living will, Does patient want to make changes to advanced directive?: No - Patient declined  Allergies  Allergen Reactions  . Latex     To face  . Erythromycin   . Macrodantin [Nitrofurantoin Macrocrystal]   . Penicillins   . Septra [Sulfamethoxazole-Trimethoprim]   . Sulfa Antibiotics   . Trimethoprim     Chief Complaint  Patient presents with  . Acute Visit    Sinus pressure since trip to mountains.      HPI: Patient is a 82 y.o. female seen in the office today due to cough and loss of voice  [place] last [place]. On the way there (6 days ago) had sore throat and drainage. Got worse over the next 2 days and then better.  Nasal congestion  Chest now feels full, was not coughing a lot but just had coughing fit.  Productive cough- thick mucous, gray Trying to drink more water No fever or chills More fatigued Has not taken any medication except for a tylenol a few days ago at night.  Sleeping well   Review of Systems:  Review of Systems  Constitutional: Positive for fatigue. Negative for fever, chills, activity change and appetite change.  HENT: Positive for congestion, postnasal drip and rhinorrhea. Negative for sore throat.   Respiratory: Positive for cough. Negative for shortness of breath.   Cardiovascular: Negative for chest pain.  Musculoskeletal: Positive for myalgias.  Neurological: Negative for dizziness, weakness and light-headedness.    Past Medical History  Diagnosis Date  . Chest pain at rest 01/07/2012  . Pain in joint, hand   . Unspecified hypothyroidism   . Inguinal hernia without mention of obstruction or gangrene, unilateral or unspecified, (not specified as recurrent)   . Senile osteoporosis   . Unspecified vitamin D  deficiency   . Female stress incontinence   . Macular degeneration (senile) of retina, unspecified   . Unspecified glaucoma   . Osteoarthrosis, unspecified whether generalized or localized, unspecified site   . Unspecified essential hypertension   . Internal hemorrhoids without mention of complication   . Lumbago   . Reflux esophagitis   . Displacement of cervical intervertebral disc without myelopathy   . Other and unspecified hyperlipidemia   . Cervicalgia   . Scoliosis (and kyphoscoliosis), idiopathic   . Diverticulosis of colon (without mention of hemorrhage)   . Palpitations   . Skin disorder   . Thyroid disease   . Unruptured popliteal cyst 06/24/2014    Right knee    Past Surgical History  Procedure Laterality Date  . Tonsillectomy  1937  . Foot surgery  august 2013    Doran Durand, MD  . Abdominal hysterectomy  1975    Dr Mallie Mussel  . Appendectomy  1988  . Eye surgery Bilateral 2009    cataract removed right eye, Dr Charise Killian  . Tooth extraction  09/16/13    Dr Carlos American  . Nm myoview ltd      negative  . Doppler echocardiography    . Cardiolite myocardial perfusion study     Social History:   reports that she has never smoked. She has never used smokeless tobacco. She reports that she does not drink alcohol or use illicit drugs.  Family History  Problem Relation Age  of Onset  . CAD Mother   . Parkinson's disease Mother   . Cancer Father     colon cancer  . Parkinson's disease Father   . Stroke Maternal Grandmother     Medications: Patient's Medications  New Prescriptions   No medications on file  Previous Medications   BIMATOPROST (LUMIGAN) 0.01 % SOLN    Place 1 drop into both eyes at bedtime.   CALCIUM ELEMENTAL AS CARBONATE (BARIATRIC TUMS ULTRA) 400 MG TABLET    Chew 500 mg by mouth daily.    CHOLECALCIFEROL (VITAMIN D3) 2000 UNITS TABS    Take 2,000 Units/day by mouth 2 (two) times daily.   CLOPIDOGREL (PLAVIX) 75 MG TABLET    TAKE 1 TABLET ONCE A DAY TO PREVENT  STROKE.   HYDROCORTISONE CREAM 1 %    Apply 1 application topically as needed for itching.   METOPROLOL SUCCINATE (TOPROL-XL) 25 MG 24 HR TABLET    One daily to regulate heart rate and control BP   MULTIPLE VITAMINS-MINERALS (CENTRUM SILVER ULTRA WOMENS PO)    Take one tablet by mouth once daily for supplement   MULTIPLE VITAMINS-MINERALS (PRESERVISION AREDS 2 PO)    Take by mouth 2 (two) times daily.   OFLOXACIN (OCUFLOX) 0.3 % OPHTHALMIC SOLUTION       PANTOPRAZOLE (PROTONIX) 20 MG TABLET    Take 20 mg by mouth daily.   POLYETHYL GLYCOL-PROPYL GLYCOL (SYSTANE OP)    Place 1 drop into both eyes 2 (two) times daily. May use up to 3 times if needed.  Modified Medications   No medications on file  Discontinued Medications   DICLOFENAC SODIUM (VOLTAREN) 1 % GEL    daily as needed.   PANTOPRAZOLE (PROTONIX) 40 MG TABLET    TAKE 1 TAB DAILY FOR STOMACH ACID     Physical Exam:  Filed Vitals:   11/08/15 1145  BP: 112/74  Pulse: 68  Temp: 97.6 F (36.4 C)  TempSrc: Oral  Resp: 17  Height: 5\' 2"  (1.575 m)  Weight: 124 lb 9.6 oz (56.518 kg)  SpO2: 99%   Body mass index is 22.78 kg/(m^2).  Physical Exam  Constitutional: She is oriented to person, place, and time. She appears well-developed and well-nourished. No distress.  HENT:  Head: Normocephalic and atraumatic.  Right Ear: External ear normal.  Left Ear: External ear normal.  Nose: Nose normal.  Mouth/Throat: Oropharynx is clear and moist. No oropharyngeal exudate.  Impacted cerumen bilaterally  Eyes: Conjunctivae are normal. Pupils are equal, round, and reactive to light.  Neck: Normal range of motion. Neck supple.  Cardiovascular: Normal rate, regular rhythm and normal heart sounds.   Pulmonary/Chest: Effort normal and breath sounds normal.  Lymphadenopathy:    She has no cervical adenopathy.  Neurological: She is alert and oriented to person, place, and time.  Skin: Skin is warm and dry. She is not diaphoretic.    Psychiatric: She has a normal mood and affect.    Labs reviewed: Basic Metabolic Panel:  Recent Labs  06/18/15 0922  NA 142  K 3.9  CL 102  CO2 27  GLUCOSE 85  BUN 18  CREATININE 0.64  CALCIUM 9.4  TSH 4.290   Liver Function Tests:  Recent Labs  06/18/15 0922  AST 21  ALT 17  ALKPHOS 70  BILITOT 0.4  PROT 6.5  ALBUMIN 4.1   No results for input(s): LIPASE, AMYLASE in the last 8760 hours. No results for input(s): AMMONIA in the last 8760 hours.  CBC: No results for input(s): WBC, NEUTROABS, HGB, HCT, MCV, PLT in the last 8760 hours. Lipid Panel:  Recent Labs  06/18/15 0922  CHOL 185  HDL 85  LDLCALC 82  TRIG 92  CHOLHDL 2.2   TSH:  Recent Labs  06/18/15 0922  TSH 4.290   A1C: Lab Results  Component Value Date   HGBA1C 5.3 05/02/2013     Assessment/Plan 1. Nasopharyngitis Most likely viral, Overall symptoms improving  May use mucinex DM 1-2 tablets BID for 7 days with increase fluid  -discussed return precautions and to follow up if no improvement   2. Bilateral impacted cerumen -irrigated to the right ear and tolerated well -unable to remove wax from left, to use debrox 5 drops into ear twice daily for 4 days then to come back for evaluation and to be flushed   Sasha Rueth K. Harle Battiest  [facility] [phone] 8 am - 5 pm) [phone] (after hours)

## 2015-11-08 NOTE — Patient Instructions (Signed)
To take mucinex DM 1-2 tablets twice daily for 1 week with full glass of water for 1 week to help with congestion and cough  Upper Respiratory Infection, Adult Most upper respiratory infections (URIs) are a viral infection of the air passages leading to the lungs. A URI affects the nose, throat, and upper air passages. The most common type of URI is nasopharyngitis and is typically referred to as "the common cold." URIs run their course and usually go away on their own. Most of the time, a URI does not require medical attention, but sometimes a bacterial infection in the upper airways can follow a viral infection. This is called a secondary infection. Sinus and middle ear infections are common types of secondary upper respiratory infections. Bacterial pneumonia can also complicate a URI. A URI can worsen asthma and chronic obstructive pulmonary disease (COPD). Sometimes, these complications can require emergency medical care and may be life threatening.  CAUSES Almost all URIs are caused by viruses. A virus is a type of germ and can spread from one person to another.  RISKS FACTORS You may be at risk for a URI if:   You smoke.   You have chronic heart or lung disease.  You have a weakened defense (immune) system.   You are very young or very old.   You have nasal allergies or asthma.  You work in crowded or poorly ventilated areas.  You work in health care facilities or schools. SIGNS AND SYMPTOMS  Symptoms typically develop 2-3 days after you come in contact with a cold virus. Most viral URIs last 7-10 days. However, viral URIs from the influenza virus (flu virus) can last 14-18 days and are typically more severe. Symptoms may include:   Runny or stuffy (congested) nose.   Sneezing.   Cough.   Sore throat.   Headache.   Fatigue.   Fever.   Loss of appetite.   Pain in your forehead, behind your eyes, and over your cheekbones (sinus pain).  Muscle aches.   DIAGNOSIS  Your health care provider may diagnose a URI by:  Physical exam.  Tests to check that your symptoms are not due to another condition such as:  Strep throat.  Sinusitis.  Pneumonia.  Asthma. TREATMENT  A URI goes away on its own with time. It cannot be cured with medicines, but medicines may be prescribed or recommended to relieve symptoms. Medicines may help:  Reduce your fever.  Reduce your cough.  Relieve nasal congestion. HOME CARE INSTRUCTIONS   Take medicines only as directed by your health care provider.   Gargle warm saltwater or take cough drops to comfort your throat as directed by your health care provider.  Use a warm mist humidifier or inhale steam from a shower to increase air moisture. This may make it easier to breathe.  Drink enough fluid to keep your urine clear or pale yellow.   Eat soups and other clear broths and maintain good nutrition.   Rest as needed.   Return to work when your temperature has returned to normal or as your health care provider advises. You may need to stay home longer to avoid infecting others. You can also use a face mask and careful hand washing to prevent spread of the virus.  Increase the usage of your inhaler if you have asthma.   Do not use any tobacco products, including cigarettes, chewing tobacco, or electronic cigarettes. If you need help quitting, ask your health care provider. PREVENTION  The best way to protect yourself from getting a cold is to practice good hygiene.   Avoid oral or hand contact with people with cold symptoms.   Wash your hands often if contact occurs.  There is no clear evidence that vitamin C, vitamin E, echinacea, or exercise reduces the chance of developing a cold. However, it is always recommended to get plenty of rest, exercise, and practice good nutrition.  SEEK MEDICAL CARE IF:   You are getting worse rather than better.   Your symptoms are not controlled by  medicine.   You have chills.  You have worsening shortness of breath.  You have brown or red mucus.  You have yellow or brown nasal discharge.  You have pain in your face, especially when you bend forward.  You have a fever.  You have swollen neck glands.  You have pain while swallowing.  You have white areas in the back of your throat. SEEK IMMEDIATE MEDICAL CARE IF:   You have severe or persistent:  Headache.  Ear pain.  Sinus pain.  Chest pain.  You have chronic lung disease and any of the following:  Wheezing.  Prolonged cough.  Coughing up blood.  A change in your usual mucus.  You have a stiff neck.  You have changes in your:  Vision.  Hearing.  Thinking.  Mood. MAKE SURE YOU:   Understand these instructions.  Will watch your condition.  Will get help right away if you are not doing well or get worse.   This information is not intended to replace advice given to you by your health care provider. Make sure you discuss any questions you have with your health care provider.   Document Released: 10/25/2000 Document Revised: 09/15/2014 Document Reviewed: 08/06/2013 Elsevier Interactive Patient Education [employer].

## 2015-11-11 ENCOUNTER — Ambulatory Visit (INDEPENDENT_AMBULATORY_CARE_PROVIDER_SITE_OTHER): Payer: Medicare Other | Admitting: Nurse Practitioner

## 2015-11-11 ENCOUNTER — Encounter: Payer: Self-pay | Admitting: Nurse Practitioner

## 2015-11-11 VITALS — BP 114/72 | HR 60 | Temp 98.0°F | Resp 17 | Ht 62.0 in | Wt 124.2 lb

## 2015-11-11 DIAGNOSIS — H6122 Impacted cerumen, left ear: Secondary | ICD-10-CM

## 2015-11-11 DIAGNOSIS — J00 Acute nasopharyngitis [common cold]: Secondary | ICD-10-CM | POA: Diagnosis not present

## 2015-11-11 NOTE — Progress Notes (Signed)
Patient ID: [name], female   DOB: [birth year], 82 y.o.   MRN: [id number]    PCP: Estill Dooms, MD  Advanced Directive information Does patient have an advance directive?: No, Does patient want to make changes to advanced directive?: No - Patient declined  Allergies  Allergen Reactions  . Latex     To face  . Erythromycin   . Macrodantin [Nitrofurantoin Macrocrystal]   . Penicillins   . Septra [Sulfamethoxazole-Trimethoprim]   . Sulfa Antibiotics   . Trimethoprim     Chief Complaint  Patient presents with  . Acute Visit    Follow up for ear pain.      HPI: Patient is a 82 y.o. female seen in the office today to follow up cerumen impaction. Pt applied debrox to left ear canal after unsuccessful lavage on Monday. Denies ear pain or fullness. Had been feeling pressure.  Review of Systems:  Review of Systems  HENT: Negative for congestion, ear discharge, ear pain, facial swelling, hearing loss, postnasal drip, sinus pressure and sore throat.   Respiratory: Positive for cough.     Past Medical History  Diagnosis Date  . Chest pain at rest 01/07/2012  . Pain in joint, hand   . Unspecified hypothyroidism   . Inguinal hernia without mention of obstruction or gangrene, unilateral or unspecified, (not specified as recurrent)   . Senile osteoporosis   . Unspecified vitamin D deficiency   . Female stress incontinence   . Macular degeneration (senile) of retina, unspecified   . Unspecified glaucoma   . Osteoarthrosis, unspecified whether generalized or localized, unspecified site   . Unspecified essential hypertension   . Internal hemorrhoids without mention of complication   . Lumbago   . Reflux esophagitis   . Displacement of cervical intervertebral disc without myelopathy   . Other and unspecified hyperlipidemia   . Cervicalgia   . Scoliosis (and kyphoscoliosis), idiopathic   . Diverticulosis of colon (without mention of hemorrhage)   . Palpitations   . Skin  disorder   . Thyroid disease   . Unruptured popliteal cyst 06/24/2014    Right knee    Past Surgical History  Procedure Laterality Date  . Tonsillectomy  1937  . Foot surgery  august 2013    Doran Durand, MD  . Abdominal hysterectomy  1975    Dr Mallie Mussel  . Appendectomy  1988  . Eye surgery Bilateral 2009    cataract removed right eye, Dr Charise Killian  . Tooth extraction  09/16/13    Dr Carlos American  . Nm myoview ltd      negative  . Doppler echocardiography    . Cardiolite myocardial perfusion study     Social History:   reports that she has never smoked. She has never used smokeless tobacco. She reports that she does not drink alcohol or use illicit drugs.  Family History  Problem Relation Age of Onset  . CAD Mother   . Parkinson's disease Mother   . Cancer Father     colon cancer  . Parkinson's disease Father   . Stroke Maternal Grandmother     Medications: Patient's Medications  New Prescriptions   No medications on file  Previous Medications   BIMATOPROST (LUMIGAN) 0.01 % SOLN    Place 1 drop into both eyes at bedtime.   CALCIUM ELEMENTAL AS CARBONATE (BARIATRIC TUMS ULTRA) 400 MG TABLET    Chew 500 mg by mouth daily.    CHOLECALCIFEROL (VITAMIN D3) 2000 UNITS TABS  Take 2,000 Units/day by mouth 2 (two) times daily.   CLOPIDOGREL (PLAVIX) 75 MG TABLET    TAKE 1 TABLET ONCE A DAY TO PREVENT STROKE.   HYDROCORTISONE CREAM 1 %    Apply 1 application topically as needed for itching.   METOPROLOL SUCCINATE (TOPROL-XL) 25 MG 24 HR TABLET    One daily to regulate heart rate and control BP   MULTIPLE VITAMINS-MINERALS (CENTRUM SILVER ULTRA WOMENS PO)    Take one tablet by mouth once daily for supplement   MULTIPLE VITAMINS-MINERALS (PRESERVISION AREDS 2 PO)    Take by mouth 2 (two) times daily.   OFLOXACIN (OCUFLOX) 0.3 % OPHTHALMIC SOLUTION       PANTOPRAZOLE (PROTONIX) 20 MG TABLET    Take 20 mg by mouth daily.   POLYETHYL GLYCOL-PROPYL GLYCOL (SYSTANE OP)    Place 1 drop into both eyes 2  (two) times daily. May use up to 3 times if needed.  Modified Medications   No medications on file  Discontinued Medications   No medications on file     Physical Exam:  Filed Vitals:   11/11/15 1514  BP: 114/72  Pulse: 60  Temp: 98 F (36.7 C)  TempSrc: Oral  Resp: 17  Height: 5\' 2"  (1.575 m)  Weight: 124 lb 3.2 oz (56.337 kg)  SpO2: 96%   Body mass index is 22.71 kg/(m^2).  Physical Exam  Constitutional: She appears well-developed and well-nourished.  HENT:  Right Ear: Hearing, tympanic membrane, external ear and ear canal normal.  Left Ear: Hearing, tympanic membrane, external ear and ear canal normal.  Mouth/Throat: Oropharynx is clear and moist. No oropharyngeal exudate.  Cardiovascular: Normal rate, regular rhythm and normal heart sounds.   Pulmonary/Chest: Effort normal and breath sounds normal. No respiratory distress.    Labs reviewed: Basic Metabolic Panel:  Recent Labs  06/18/15 0922  NA 142  K 3.9  CL 102  CO2 27  GLUCOSE 85  BUN 18  CREATININE 0.64  CALCIUM 9.4  TSH 4.290   Liver Function Tests:  Recent Labs  06/18/15 0922  AST 21  ALT 17  ALKPHOS 70  BILITOT 0.4  PROT 6.5  ALBUMIN 4.1   No results for input(s): LIPASE, AMYLASE in the last 8760 hours. No results for input(s): AMMONIA in the last 8760 hours. CBC: No results for input(s): WBC, NEUTROABS, HGB, HCT, MCV, PLT in the last 8760 hours. Lipid Panel:  Recent Labs  06/18/15 0922  CHOL 185  HDL 85  LDLCALC 82  TRIG 92  CHOLHDL 2.2   TSH:  Recent Labs  06/18/15 0922  TSH 4.290   A1C: Lab Results  Component Value Date   HGBA1C 5.3 05/02/2013     Assessment/Plan 1. Cerumen impaction, left Completed 3 days of debrox, flushed with saline and peroxide solution with good results. Pt tolerated well, reports she can hear better after waxed removed  2. Nasopharyngitis Improved but still with cough and congestion -to cont mucinex DM and increase water  intake   Jessica K. Harle Battiest  [facility] [phone] 8 am - 5 pm) [phone] (after hours)

## 2015-11-23 DIAGNOSIS — M25562 Pain in left knee: Secondary | ICD-10-CM | POA: Diagnosis not present

## 2015-12-21 ENCOUNTER — Ambulatory Visit: Payer: Self-pay | Admitting: Internal Medicine

## 2015-12-27 DIAGNOSIS — H401112 Primary open-angle glaucoma, right eye, moderate stage: Secondary | ICD-10-CM | POA: Diagnosis not present

## 2015-12-28 ENCOUNTER — Encounter: Payer: Self-pay | Admitting: Internal Medicine

## 2015-12-28 ENCOUNTER — Ambulatory Visit (INDEPENDENT_AMBULATORY_CARE_PROVIDER_SITE_OTHER): Payer: Medicare Other | Admitting: Internal Medicine

## 2015-12-28 VITALS — BP 104/68 | HR 66 | Temp 99.7°F | Ht 62.0 in | Wt 124.0 lb

## 2015-12-28 DIAGNOSIS — E785 Hyperlipidemia, unspecified: Secondary | ICD-10-CM | POA: Diagnosis not present

## 2015-12-28 DIAGNOSIS — I10 Essential (primary) hypertension: Secondary | ICD-10-CM

## 2015-12-28 DIAGNOSIS — L84 Corns and callosities: Secondary | ICD-10-CM | POA: Diagnosis not present

## 2015-12-28 DIAGNOSIS — K219 Gastro-esophageal reflux disease without esophagitis: Secondary | ICD-10-CM | POA: Diagnosis not present

## 2015-12-28 DIAGNOSIS — E039 Hypothyroidism, unspecified: Secondary | ICD-10-CM | POA: Diagnosis not present

## 2015-12-28 DIAGNOSIS — B079 Viral wart, unspecified: Secondary | ICD-10-CM | POA: Insufficient documentation

## 2015-12-28 MED ORDER — METOPROLOL SUCCINATE ER 25 MG PO TB24: ORAL_TABLET | ORAL | 3 refills | Status: DC

## 2015-12-28 NOTE — Progress Notes (Signed)
Facility  [place]    [name]:   OFFICE    Allergies  Allergen Reactions  . Latex     To face  . Erythromycin   . Macrodantin [Nitrofurantoin Macrocrystal]   . Penicillins   . Septra [Sulfamethoxazole-Trimethoprim]   . Sulfa Antibiotics   . Trimethoprim     Chief Complaint  Patient presents with  . Medical Management of Chronic Issues    6 month medical management blood pressure, thyroid, cholesterol.    HPI:  Gastroesophageal reflux disease without esophagitis - tried to get off the pantoprazole, but did not tolerate the reduced dose. Started burning in the stomach. Went back o n 40 mg and symptoms subsided. Also had some bloating. Denies nausea and dysphagia. No change in stools. Sleeps on a wedge.   Essential hypertension - controlled on metoprolol succinate (TOPROL-XL) 25 MG 24 hr tablet  Hyperlipidemia - needs lab next visit  Hypothyroidism, unspecified hypothyroidism type - follow up TSH next visit.  Planning to move to [facility], but is insecure about her decision. Has already planned the move. Having to leave a lot behind.  Painful wart to the left heel.  2 painful corns of the left foot on the sole.  Medications: Patient's Medications  New Prescriptions   No medications on file  Previous Medications   BIMATOPROST (LUMIGAN) 0.01 % SOLN    Place 1 drop into both eyes at bedtime.   CALCIUM ELEMENTAL AS CARBONATE (BARIATRIC TUMS ULTRA) 400 MG TABLET    Chew 500 mg by mouth daily.    CHOLECALCIFEROL (VITAMIN D3) 2000 UNITS TABS    Take 2,000 Units/day by mouth 2 (two) times daily.   CLOPIDOGREL (PLAVIX) 75 MG TABLET    TAKE 1 TABLET ONCE A DAY TO PREVENT STROKE.   HYDROCORTISONE CREAM 1 %    Apply 1 application topically as needed for itching.   MULTIPLE VITAMINS-MINERALS (CENTRUM SILVER ULTRA WOMENS PO)    Take one tablet by mouth once daily for supplement   MULTIPLE VITAMINS-MINERALS (PRESERVISION AREDS 2 PO)    Take by mouth 2 (two) times daily.   OFLOXACIN (OCUFLOX) 0.3 % OPHTHALMIC SOLUTION       PANTOPRAZOLE (PROTONIX) 40 MG TABLET    Take 40 mg by mouth. Take one tablet daily for stomach   POLYETHYL GLYCOL-PROPYL GLYCOL (SYSTANE OP)    Place 1 drop into both eyes 2 (two) times daily. May use up to 3 times if needed.  Modified Medications   Modified Medication Previous Medication   METOPROLOL SUCCINATE (TOPROL-XL) 25 MG 24 HR TABLET metoprolol succinate (TOPROL-XL) 25 MG 24 hr tablet      One daily to regulate heart rate and control BP    One daily to regulate heart rate and control BP  Discontinued Medications   PANTOPRAZOLE (PROTONIX) 20 MG TABLET    Take 20 mg by mouth daily.    Review of Systems  Constitutional: Positive for fatigue. Negative for activity change, appetite change, chills, diaphoresis, fever and unexpected weight change.  HENT: Positive for congestion, postnasal drip and rhinorrhea. Negative for ear discharge, ear pain, hearing loss, sore throat, tinnitus, trouble swallowing and voice change.   Eyes: Negative for pain, redness, itching and visual disturbance.       Left cataract. Dry macular degeneration.  Respiratory: Positive for cough. Negative for choking, shortness of breath and wheezing.   Cardiovascular: Negative for chest pain, palpitations and leg swelling.  Gastrointestinal: Negative for abdominal distention, abdominal pain,  constipation, diarrhea and nausea.       Occasional reflux and heartburn for which she uses Pantoprazole and Tums.  Endocrine: Negative for cold intolerance, heat intolerance, polydipsia, polyphagia and polyuria.  Genitourinary: Negative for difficulty urinating, dysuria, flank pain, frequency, hematuria, pelvic pain, urgency and vaginal discharge.       Stress incontinence.  Musculoskeletal: Positive for myalgias. Negative for arthralgias, back pain, gait problem, neck pain and neck stiffness.       Some joint pains. Bilateral shoulder discomfort; worse in the right shoulder.    Skin: Negative for color change, pallor and rash.       Painful corns and calluses. On the sole of the left foot.  Neurological: Negative for dizziness, tremors, seizures, syncope, weakness, light-headedness, numbness and headaches.  Hematological: Negative for adenopathy. Does not bruise/bleed easily.  Psychiatric/Behavioral: Negative for agitation, behavioral problems, confusion, dysphoric mood, hallucinations, sleep disturbance and suicidal ideas. The patient is not nervous/anxious and is not hyperactive.     Vitals:   12/28/15 1345  BP: 104/68  Pulse: 66  Temp: 99.7 F (37.6 C)  TempSrc: Oral  SpO2: 96%  Weight: 124 lb (56.2 kg)  Height: '5\' 2"'  (1.575 m)   Body mass index is 22.68 kg/m. Wt Readings from Last 3 Encounters:  12/28/15 124 lb (56.2 kg)  11/11/15 124 lb 3.2 oz (56.3 kg)  11/08/15 124 lb 9.6 oz (56.5 kg)      Physical Exam  Constitutional: She is oriented to person, place, and time. She appears well-developed and well-nourished. No distress.  HENT:  Head: Normocephalic and atraumatic.  Right Ear: External ear normal.  Left Ear: External ear normal.  Nose: Nose normal.  Mouth/Throat: Oropharynx is clear and moist.  Eyes: Conjunctivae are normal. Pupils are equal, round, and reactive to light. Left eye exhibits no discharge.  Neck: No JVD present. No tracheal deviation present. No thyromegaly present.  Cardiovascular: Normal rate, regular rhythm, normal heart sounds and intact distal pulses.  Exam reveals no gallop and no friction rub.   No murmur heard. Pulmonary/Chest: No respiratory distress. She has no wheezes. She has no rales. She exhibits no tenderness.  Abdominal: She exhibits no distension and no mass. There is no tenderness.  Right inguinal hernia unchanged.  Musculoskeletal: She exhibits no edema or tenderness.  Limited ability to raise arms above his shoulders.  Lymphadenopathy:    She has no cervical adenopathy.  Neurological: She is alert and  oriented to person, place, and time. She has normal reflexes. No cranial nerve deficit. Coordination normal.  Skin: No rash noted. No erythema. No pallor.  2 painful corns of the sole of the left foot. Painful wart on the left heel.  Psychiatric: She has a normal mood and affect. Her behavior is normal. Judgment and thought content normal.    Labs reviewed: Lab Summary Latest Ref Rng & Units 06/18/2015 06/22/2014  Hemoglobin 11.1 - 15.9 g/dL (None) 12.0  Hematocrit 34.0 - 46.6 % (None) 37.0  White count 3.4 - 10.8 x10E3/uL (None) 6.4  Platelet count - (None) (None)  Sodium 134 - 144 mmol/L 142 142  Potassium 3.5 - 5.2 mmol/L 3.9 4.1  Calcium 8.7 - 10.3 mg/dL 9.4 9.0  Phosphorus - (None) (None)  Creatinine 0.57 - 1.00 mg/dL 0.64 0.69  AST 0 - 40 IU/L 21 20  Alk Phos 39 - 117 IU/L 70 77  Bilirubin 0.0 - 1.2 mg/dL 0.4 0.2  Glucose 65 - 99 mg/dL 85 85  Cholesterol - (None) (  None)  HDL cholesterol >39 mg/dL 85 83  Triglycerides 0 - 149 mg/dL 92 75  LDL Direct - (None) (None)  LDL Calc 0 - 99 mg/dL 82 86  Total protein - (None) (None)  Albumin 3.5 - 4.7 g/dL 4.1 3.9  Some recent data might be hidden   Lab Results  Component Value Date   TSH 4.290 06/18/2015   TSH 4.110 06/22/2014   TSH 4.400 12/20/2012   Lab Results  Component Value Date   BUN 18 06/18/2015   BUN 24 06/22/2014   BUN 20 05/01/2013   Lab Results  Component Value Date   HGBA1C 5.3 05/02/2013    Assessment/Plan  1. Essential hypertension - metoprolol succinate (TOPROL-XL) 25 MG 24 hr tablet; One daily to regulate heart rate and control BP  Dispense: 90 tablet; Refill: 3 - Comprehensive metabolic panel; Future  2. Gastroesophageal reflux disease without esophagitis Patient feels it was unwise to stop the pantoprazole. She has resumed. She is hoping her stomach and GERD will improve.  3. Hyperlipidemia - Lipid panel; Future  4. Hypothyroidism, unspecified hypothyroidism type - TSH; Future  5.  Wart Sharply debrided. Some bleeding occurred. Silver nitrite cautery was applied. Patient was advised to soak foot daily.  6. Corns Sharply debrided and removed 2 corns left foot.

## 2015-12-28 NOTE — Patient Instructions (Signed)
Soak foot in warm water 20 minutes daily until healed. Wash with soap and water. Apply non-stick dressing.

## 2016-02-18 ENCOUNTER — Ambulatory Visit (INDEPENDENT_AMBULATORY_CARE_PROVIDER_SITE_OTHER): Payer: Medicare Other

## 2016-02-18 DIAGNOSIS — Z23 Encounter for immunization: Secondary | ICD-10-CM

## 2016-02-19 ENCOUNTER — Other Ambulatory Visit: Payer: Self-pay | Admitting: Internal Medicine

## 2016-03-08 ENCOUNTER — Encounter: Payer: Self-pay | Admitting: Internal Medicine

## 2016-03-08 ENCOUNTER — Ambulatory Visit (INDEPENDENT_AMBULATORY_CARE_PROVIDER_SITE_OTHER): Payer: Medicare Other | Admitting: Internal Medicine

## 2016-03-08 VITALS — BP 150/84 | HR 67 | Temp 97.7°F | Ht 62.0 in | Wt 123.0 lb

## 2016-03-08 DIAGNOSIS — K4090 Unilateral inguinal hernia, without obstruction or gangrene, not specified as recurrent: Secondary | ICD-10-CM

## 2016-03-08 DIAGNOSIS — I10 Essential (primary) hypertension: Secondary | ICD-10-CM | POA: Diagnosis not present

## 2016-03-08 DIAGNOSIS — R609 Edema, unspecified: Secondary | ICD-10-CM | POA: Insufficient documentation

## 2016-03-08 DIAGNOSIS — R1031 Right lower quadrant pain: Secondary | ICD-10-CM | POA: Diagnosis not present

## 2016-03-08 NOTE — Patient Instructions (Signed)
Wear compression stockings when on airplane or traveling more than 2 hours in other transportation.

## 2016-03-08 NOTE — Progress Notes (Signed)
Facility  [place]    [name]:   OFFICE    Allergies  Allergen Reactions  . Latex     To face  . Erythromycin   . Macrodantin [Nitrofurantoin Macrocrystal]   . Penicillins   . Septra [Sulfamethoxazole-Trimethoprim]   . Sulfa Antibiotics   . Trimethoprim     Chief Complaint  Patient presents with  . Acute Visit    ankles swelling  . Abdominal Pain    right lower    HPI:  Abdominal pain, RLQ - Patient has had 2 episodes of moderate right lower quadrant discomfort that did not seem related to swelling in the area of her previously identified right inguinal hernia. It was not accompanied by fever or nausea or dysuria. There has been no alteration in stools or bowel habits. The pain lasted somewhere between 2 and 4 hours each time. On the last occasion, she was participating in the [employer]. She was able to finish the walk, but did not walk as far as she usually would.  Unilateral inguinal hernia without obstruction or gangrene, recurrence not specified - identified 2 years ago. Has had no symptomatology related to this.  Essential hypertension - mild elevation in systolic blood pressure that may be related to the fact that she felt quite stressed by being a little bit late to her appointment today.  Edema, unspecified type - only in the right ankle and foot. There is more sodium in the new diet she has been eating at [facility]. She denies any calf discomfort.    Medications: Patient's Medications  New Prescriptions   No medications on file  Previous Medications   BIMATOPROST (LUMIGAN) 0.01 % SOLN    Place 1 drop into both eyes at bedtime.   CALCIUM ELEMENTAL AS CARBONATE (BARIATRIC TUMS ULTRA) 400 MG TABLET    Chew 500 mg by mouth daily.    CHOLECALCIFEROL (VITAMIN D3) 2000 UNITS TABS    Take 2,000 Units/day by mouth 2 (two) times daily.   CLOPIDOGREL (PLAVIX) 75 MG TABLET    TAKE 1 TABLET ONCE A DAY TO PREVENT STROKE.   HYDROCORTISONE CREAM 1 %     Apply 1 application topically as needed for itching.   METOPROLOL SUCCINATE (TOPROL-XL) 25 MG 24 HR TABLET    One daily to regulate heart rate and control BP   MULTIPLE VITAMINS-MINERALS (CENTRUM SILVER ULTRA WOMENS PO)    Take one tablet by mouth once daily for supplement   MULTIPLE VITAMINS-MINERALS (PRESERVISION AREDS 2 PO)    Take by mouth 2 (two) times daily.   OFLOXACIN (OCUFLOX) 0.3 % OPHTHALMIC SOLUTION       PANTOPRAZOLE (PROTONIX) 40 MG TABLET    TAKE 1 TAB DAILY FOR STOMACH ACID   POLYETHYL GLYCOL-PROPYL GLYCOL (SYSTANE OP)    Place 1 drop into both eyes 2 (two) times daily. May use up to 3 times if needed.  Modified Medications   No medications on file  Discontinued Medications   No medications on file    Review of Systems  Constitutional: Positive for fatigue. Negative for activity change, appetite change, chills, diaphoresis, fever and unexpected weight change.  HENT: Positive for congestion, postnasal drip and rhinorrhea. Negative for ear discharge, ear pain, hearing loss, sore throat, tinnitus, trouble swallowing and voice change.   Eyes: Negative for pain, redness, itching and visual disturbance.       Left cataract. Dry macular degeneration.  Respiratory: Positive for cough. Negative for choking, shortness  of breath and wheezing.   Cardiovascular: Positive for leg swelling. Negative for chest pain and palpitations.  Gastrointestinal: Positive for abdominal pain (RQ lasting about 2-4 hours on 2 occassions.). Negative for abdominal distention, constipation, diarrhea and nausea.       Occasional reflux and heartburn for which she uses Pantoprazole and Tums.  Endocrine: Negative for cold intolerance, heat intolerance, polydipsia, polyphagia and polyuria.  Genitourinary: Negative for difficulty urinating, dysuria, flank pain, frequency, hematuria, pelvic pain, urgency and vaginal discharge.       Stress incontinence.  Musculoskeletal: Positive for myalgias. Negative for  arthralgias, back pain, gait problem, neck pain and neck stiffness.       Some joint pains. Bilateral shoulder discomfort; worse in the right shoulder.  Skin: Negative for color change, pallor and rash.       Previous Painful corns and calluses are improved on the sole of the left foot.  Neurological: Negative for dizziness, tremors, seizures, syncope, weakness, light-headedness, numbness and headaches.  Hematological: Negative for adenopathy. Does not bruise/bleed easily.  Psychiatric/Behavioral: Negative for agitation, behavioral problems, confusion, dysphoric mood, hallucinations, sleep disturbance and suicidal ideas. The patient is not nervous/anxious and is not hyperactive.     Vitals:   03/08/16 1124 03/08/16 1155  BP: (!) 156/82 (!) 150/84  Pulse: 67   Temp: 97.7 F (36.5 C)   TempSrc: Oral   SpO2: 97%   Weight: 123 lb (55.8 kg)   Height: '5\' 2"'  (1.575 m)    Body mass index is 22.5 kg/m. Wt Readings from Last 3 Encounters:  03/08/16 123 lb (55.8 kg)  12/28/15 124 lb (56.2 kg)  11/11/15 124 lb 3.2 oz (56.3 kg)      Physical Exam  Constitutional: She is oriented to person, place, and time. She appears well-developed and well-nourished. No distress.  HENT:  Head: Normocephalic and atraumatic.  Right Ear: External ear normal.  Left Ear: External ear normal.  Nose: Nose normal.  Mouth/Throat: Oropharynx is clear and moist.  Eyes: Conjunctivae are normal. Pupils are equal, round, and reactive to light. Left eye exhibits no discharge.  Neck: No JVD present. No tracheal deviation present. No thyromegaly present.  Cardiovascular: Normal rate, regular rhythm, normal heart sounds and intact distal pulses.  Exam reveals no gallop and no friction rub.   No murmur heard. Pulmonary/Chest: No respiratory distress. She has no wheezes. She has no rales. She exhibits no tenderness.  Abdominal: She exhibits no distension and no mass. There is no tenderness.  Right inguinal hernia  unchanged. Unable to elicit any point tenderness in the abdomen or feel a mass.  Musculoskeletal: She exhibits no edema or tenderness.  Limited ability to raise arms above his shoulders.  Lymphadenopathy:    She has no cervical adenopathy.  Neurological: She is alert and oriented to person, place, and time. She has normal reflexes. No cranial nerve deficit. Coordination normal.  Skin: No rash noted. No erythema. No pallor.  2 painful corns of the sole of the left foot. Painful wart on the left heel.  Psychiatric: She has a normal mood and affect. Her behavior is normal. Judgment and thought content normal.    Labs reviewed: Lab Summary Latest Ref Rng & Units 06/18/2015 06/22/2014  Hemoglobin 11.1 - 15.9 g/dL (None) 12.0  Hematocrit 34.0 - 46.6 % (None) 37.0  White count 3.4 - 10.8 x10E3/uL (None) 6.4  Platelet count - (None) (None)  Sodium 134 - 144 mmol/L 142 142  Potassium 3.5 - 5.2 mmol/L 3.9  4.1  Calcium 8.7 - 10.3 mg/dL 9.4 9.0  Phosphorus - (None) (None)  Creatinine 0.57 - 1.00 mg/dL 0.64 0.69  AST 0 - 40 IU/L 21 20  Alk Phos 39 - 117 IU/L 70 77  Bilirubin 0.0 - 1.2 mg/dL 0.4 0.2  Glucose 65 - 99 mg/dL 85 85  Cholesterol - (None) (None)  HDL cholesterol >39 mg/dL 85 83  Triglycerides 0 - 149 mg/dL 92 75  LDL Direct - (None) (None)  LDL Calc 0 - 99 mg/dL 82 86  Total protein - (None) (None)  Albumin 3.5 - 4.7 g/dL 4.1 3.9  Some recent data might be hidden   Lab Results  Component Value Date   TSH 4.290 06/18/2015   TSH 4.110 06/22/2014   TSH 4.400 12/20/2012   Lab Results  Component Value Date   BUN 18 06/18/2015   BUN 24 06/22/2014   BUN 20 05/01/2013   Lab Results  Component Value Date   HGBA1C 5.3 05/02/2013    Assessment/Plan  1. Abdominal pain, RLQ - CT Angio Abd/Pel w/ and/or w/o; Future  2. Unilateral inguinal hernia without obstruction or gangrene, recurrence not specified stable  3. Essential hypertension Mild elevation in SBP likely related to  stress of being late.  4. Edema, unspecified type Very mild in the right ankle

## 2016-03-09 ENCOUNTER — Telehealth: Payer: Self-pay | Admitting: Internal Medicine

## 2016-03-09 NOTE — Telephone Encounter (Signed)
left msg asking pt to schedule AWV. VDM (DD)

## 2016-03-15 ENCOUNTER — Ambulatory Visit
Admission: RE | Admit: 2016-03-15 | Discharge: 2016-03-15 | Disposition: A | Discharge status: Home / Self Care | Payer: Medicare Other | Source: Ambulatory Visit | Attending: Internal Medicine | Admitting: Internal Medicine

## 2016-03-15 ENCOUNTER — Telehealth: Payer: Self-pay

## 2016-03-15 DIAGNOSIS — R1031 Right lower quadrant pain: Secondary | ICD-10-CM

## 2016-03-15 MED ORDER — IOPAMIDOL (ISOVUE-370) INJECTION 76%
80.0000 mL | Freq: Once | INTRAVENOUS | Status: AC | PRN
  Administered 2016-03-15: 80 mL via INTRAVENOUS

## 2016-03-15 NOTE — Telephone Encounter (Signed)
Per Dr.Green patient can go her trip confidently, Right inguinal hernia contains small bowel loops without evidence of obstruction.   Discussed results with patient, patient verbalized understanding of results

## 2016-03-22 ENCOUNTER — Ambulatory Visit: Payer: Medicare Other

## 2016-03-27 ENCOUNTER — Encounter: Payer: Self-pay | Admitting: Internal Medicine

## 2016-03-27 ENCOUNTER — Ambulatory Visit (INDEPENDENT_AMBULATORY_CARE_PROVIDER_SITE_OTHER): Payer: Medicare Other | Admitting: Internal Medicine

## 2016-03-27 VITALS — BP 150/80 | HR 67 | Temp 98.1°F | Wt 122.0 lb

## 2016-03-27 DIAGNOSIS — N3000 Acute cystitis without hematuria: Secondary | ICD-10-CM | POA: Diagnosis not present

## 2016-03-27 DIAGNOSIS — R350 Frequency of micturition: Secondary | ICD-10-CM

## 2016-03-27 LAB — POCT URINALYSIS DIPSTICK
Bilirubin, UA: NEGATIVE
Glucose, UA: NEGATIVE
Ketones, UA: NEGATIVE
Nitrite, UA: NEGATIVE
Protein, UA: NEGATIVE
Spec Grav, UA: <=1.005
Urobilinogen, UA: NEGATIVE
pH, UA: 6

## 2016-03-27 MED ORDER — CIPROFLOXACIN HCL 500 MG PO TABS: 500.0000 mg | ORAL_TABLET | Freq: Two times a day (BID) | ORAL | 0 refills | Status: DC

## 2016-03-27 NOTE — Patient Instructions (Signed)
Please try to drink 8 glasses of water and/or cranberry juice per day while receiving the cipro. Also eat yogurt as you usually do each morning.

## 2016-03-27 NOTE — Progress Notes (Signed)
Location:  [facility] clinic Provider: Zeppelin Commisso L. Mariea Clonts, D.O., C.M.D.  Code Status: DNR  Goals of Care:  Advanced Directives 03/08/2016  Does patient have an advance directive? Yes  Type of Advance Directive Living will  Does patient want to make changes to advanced directive? -  Copy of advanced directive(s) in chart? Yes     Chief Complaint  Patient presents with  . Acute Visit    UTI, burning with urination and frequent urination, x3days    HPI: Patient is a 82 y.o. female seen today for an acute visit for possible UTI.  Having burning and frequency for at least 3 days.  She had a messy bm last week earlier in the week and she wonders if that led to this.  Is very careful about wiping the right direction.  9 years since last UTI, she reports.  Has been drinking a lot of water and cranberry juice (pure stuff). Yesterday, she thought she was getting over it, but it's been back worse today.  No fever, chills.  No abdominal pain or flank pain.  Does have some suprapubic pressure.    Has plans to go to [place] and then fly out Sat night from [place] to [place] for her daughter's wedding.  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    Past Medical History:  Diagnosis Date  . Cervicalgia   . Chest pain at rest 01/07/2012  . Displacement of cervical intervertebral disc without myelopathy   . Diverticulosis of colon (without mention of hemorrhage)   . Female stress incontinence   . Inguinal hernia without mention of obstruction or gangrene, unilateral or unspecified, (not specified as recurrent)   . Internal hemorrhoids without mention of complication   . Lumbago   . Macular degeneration (senile) of retina, unspecified   . Osteoarthrosis, unspecified whether generalized or localized, unspecified site   . Other and unspecified hyperlipidemia   . Pain in joint, hand   . Palpitations   . Reflux esophagitis   . Scoliosis (and kyphoscoliosis), idiopathic   . Senile  osteoporosis   . Skin disorder   . Thyroid disease   . Unruptured popliteal cyst 06/24/2014   Right knee   . Unspecified essential hypertension   . Unspecified glaucoma(365.9)   . Unspecified hypothyroidism   . Unspecified vitamin D deficiency     Past Surgical History:  Procedure Laterality Date  . ABDOMINAL HYSTERECTOMY  1975   Dr Mallie Mussel  . APPENDECTOMY  1988  . cardiolite myocardial perfusion study    . DOPPLER ECHOCARDIOGRAPHY    . EYE SURGERY Bilateral 2009   cataract removed right eye, Dr Charise Killian  . FOOT SURGERY  august 2013   Doran Durand, MD  . NM MYOVIEW LTD     negative  . TONSILLECTOMY  1937  . TOOTH EXTRACTION  09/16/13   Dr Carlos American    Allergies  Allergen Reactions  . Latex     To face  . Erythromycin   . Macrodantin [Nitrofurantoin Macrocrystal]   . Penicillins   . Septra [Sulfamethoxazole-Trimethoprim]   . Sulfa Antibiotics   . Trimethoprim       Medication List       Accurate as of 03/27/16  3:57 PM. Always use your most recent med list.          bimatoprost 0.01 % Soln Commonly known as:  LUMIGAN Place 1 drop into both eyes at bedtime.   calcium elemental as carbonate 400  MG chewable tablet Commonly known as:  BARIATRIC TUMS ULTRA Chew 500 mg by mouth daily.   clopidogrel 75 MG tablet Commonly known as:  PLAVIX TAKE 1 TABLET ONCE A DAY TO PREVENT STROKE.   hydrocortisone cream 1 % Apply 1 application topically as needed for itching.   metoprolol succinate 25 MG 24 hr tablet Commonly known as:  TOPROL-XL One daily to regulate heart rate and control BP   pantoprazole 40 MG tablet Commonly known as:  PROTONIX TAKE 1 TAB DAILY FOR STOMACH ACID   PRESERVISION AREDS 2 PO Take by mouth 2 (two) times daily.   CENTRUM SILVER ULTRA WOMENS PO Take one tablet by mouth once daily for supplement   SYSTANE OP Place 1 drop into both eyes 2 (two) times daily. May use up to 3 times if needed.   Vitamin D3 2000 units Tabs Take 2,000 Units/day by  mouth 2 (two) times daily.       Review of Systems:  Review of Systems  Constitutional: Negative for chills, fever, malaise/fatigue and weight loss.  Respiratory: Negative for cough and shortness of breath.   Cardiovascular: Negative for chest pain and palpitations.  Gastrointestinal: Negative for abdominal pain.       Pressure only  Genitourinary: Positive for dysuria, frequency and urgency. Negative for flank pain and hematuria.  Musculoskeletal: Negative for falls.  Neurological: Negative for dizziness, loss of consciousness and weakness.  Endo/Heme/Allergies: Bruises/bleeds easily.  Psychiatric/Behavioral: Negative for memory loss. The patient is nervous/anxious.     Health Maintenance  Topic Date Due  . MAMMOGRAM  04/27/2016  . TETANUS/TDAP  03/20/2020  . INFLUENZA VACCINE  Completed  . DEXA SCAN  Completed  . ZOSTAVAX  Completed  . PNA vac Low Risk Adult  Completed    Physical Exam: Vitals:   03/27/16 1511  BP: (!) 150/80  Pulse: 67  Temp: 98.1 F (36.7 C)  TempSrc: Oral  SpO2: 98%  Weight: 122 lb (55.3 kg)   Body mass index is 22.31 kg/m. Physical Exam  Constitutional: She is oriented to person, place, and time. She appears well-developed and well-nourished. No distress.  Cardiovascular: Normal rate, regular rhythm, normal heart sounds and intact distal pulses.   Pulmonary/Chest: Effort normal and breath sounds normal. No respiratory distress.  Abdominal: Soft. Bowel sounds are normal. She exhibits no distension. There is no tenderness.  Pressure feeling over suprapubic area during exam  Musculoskeletal: Normal range of motion.  Neurological: She is alert and oriented to person, place, and time.  Skin: Skin is warm and dry. There is pallor.  Psychiatric: She has a normal mood and affect.    Labs reviewed: Basic Metabolic Panel:  Recent Labs  06/18/15 0922  NA 142  K 3.9  CL 102  CO2 27  GLUCOSE 85  BUN 18  CREATININE 0.64  CALCIUM 9.4  TSH  4.290   Liver Function Tests:  Recent Labs  06/18/15 0922  AST 21  ALT 17  ALKPHOS 70  BILITOT 0.4  PROT 6.5  ALBUMIN 4.1   No results for input(s): LIPASE, AMYLASE in the last 8760 hours. No results for input(s): AMMONIA in the last 8760 hours. CBC: No results for input(s): WBC, NEUTROABS, HGB, HCT, MCV, PLT in the last 8760 hours. Lipid Panel:  Recent Labs  06/18/15 0922  CHOL 185  HDL 85  LDLCALC 82  TRIG 92  CHOLHDL 2.2   Lab Results  Component Value Date   HGBA1C 5.3 05/02/2013    Procedures  since last visit: Ct Angio Abd/pel W/ And/or W/o  Result Date: 03/15/2016 CLINICAL DATA:  Right lower quadrant pain EXAM: CTA ABDOMEN AND PELVIS wITHOUT AND WITH CONTRAST TECHNIQUE: Multidetector CT imaging of the abdomen and pelvis was performed using the standard protocol during bolus administration of intravenous contrast. Multiplanar reconstructed images and MIPs were obtained and reviewed to evaluate the vascular anatomy. CONTRAST:  80 cc Isovue 370 COMPARISON:  None. FINDINGS: VASCULAR Aorta: Aorta is non aneurysmal and patent with minimal atherosclerotic calcification. Celiac: Patent SMA: Patent Renals: Single right renal artery is patent. Two left renal arteries are patent. IMA: Patent. Inflow: Bilateral internal, external, and common iliac arteries are non aneurysmal and patent. There are markedly tortuous. Proximal Outflow: Visualize femoral arteries are grossly patent. Veins: Hepatic, portal, splenic, superior mesenteric, and renal veins are patent. Iliac veins are patent. Review of the MIP images confirms the above findings. NON-VASCULAR Lower chest: Dependent atelectasis. Small right Bochdalek's hernia containing fat. Hepatobiliary: Liver and gallbladder are within normal limits. Pancreas: Unremarkable Spleen: Unremarkable Adrenals/Urinary Tract: Adrenal glands are within normal limits. Mild bilateral hydronephrosis is likely related to bladder distention. The bladder is  markedly distended. Stomach/Bowel: Diverticulosis of the sigmoid colon. Moderate stool burden throughout the colon. No evidence of small-bowel obstruction. Right inguinal hernia contains small bowel loops. Lymphatic: No abnormal retroperitoneal adenopathy Reproductive: Uterus is absent.  Adnexa are unremarkable Other: No free-fluid Musculoskeletal: Levoscoliosis of the lumbar spine. No vertebral compression deformity. Advanced degenerative changes. IMPRESSION: VASCULAR No acute vascular pathology.  Atherosclerotic changes are noted. NON-VASCULAR Right inguinal hernia contains small bowel loops without evidence of obstruction. Electronically Signed   By: Marybelle Killings M.D.   On: 03/15/2016 12:33    Assessment/Plan 1. Acute cystitis without hematuria - push po fluids with 8 glasses of water and unsweetened cranberry juice -may use cranberry capsules during travel -eat yogurt daily while on abx - ciprofloxacin (CIPRO) 500 MG tablet; Take 1 tablet (500 mg total) by mouth 2 (two) times daily.  Dispense: 14 tablet; Refill: 0  2. Frequent urination - due to #1, also has a little leakage so counseled to get a few depends during her travels also to prevent any embarrassment given her current infection -await culture - POC Urinalysis Dipstick - Urine culture - ciprofloxacin (CIPRO) 500 MG tablet; Take 1 tablet (500 mg total) by mouth 2 (two) times daily.  Dispense: 14 tablet; Refill: 0  Labs/tests ordered:   Orders Placed This Encounter  Procedures  . Urine culture  . POC Urinalysis Dipstick    Next appt:  06/23/2016  Borden Thune L. Carlin Mamone, D.O. [facility] [address], [place] [address] Cell Phone (Mon-Fri 8am-5pm):  [phone] On Call:  [phone] & follow prompts after 5pm & weekends Office Phone:  [phone] Office Fax:  [phone]

## 2016-03-29 LAB — URINE CULTURE

## 2016-04-12 ENCOUNTER — Telehealth: Payer: Self-pay | Admitting: *Deleted

## 2016-04-12 NOTE — Telephone Encounter (Signed)
Per Dr. Mariea Clonts pt would need to be seen again, is she drinking plenty of water? Please advise to Dr. Nyoka Cowden.

## 2016-04-12 NOTE — Telephone Encounter (Signed)
Patient called and stated that she has completed the Cipro you gave her on 03/27/16 and back from her trip but 3 days ago started having some burning and frequency again, its not happening everyday. Wondering if she should be back on antibiotic alittle longer. Please Advise.

## 2016-04-12 NOTE — Telephone Encounter (Signed)
Patient notified and appointment scheduled with Dr. Mariea Clonts tomorrow 04/13/2016. Dr. Nyoka Cowden is out on vacation till 04/19/16.

## 2016-04-13 ENCOUNTER — Ambulatory Visit (INDEPENDENT_AMBULATORY_CARE_PROVIDER_SITE_OTHER): Payer: Medicare Other | Admitting: Internal Medicine

## 2016-04-13 ENCOUNTER — Encounter: Payer: Self-pay | Admitting: Internal Medicine

## 2016-04-13 VITALS — BP 128/60 | HR 67 | Temp 97.8°F | Wt 124.0 lb

## 2016-04-13 DIAGNOSIS — R300 Dysuria: Secondary | ICD-10-CM

## 2016-04-13 DIAGNOSIS — L84 Corns and callosities: Secondary | ICD-10-CM

## 2016-04-13 DIAGNOSIS — I10 Essential (primary) hypertension: Secondary | ICD-10-CM

## 2016-04-13 LAB — POCT URINALYSIS DIPSTICK
Bilirubin, UA: NEGATIVE
Blood, UA: NEGATIVE
Glucose, UA: NEGATIVE
Ketones, UA: NEGATIVE
Leukocytes, UA: NEGATIVE
Nitrite, UA: NEGATIVE
Protein, UA: NEGATIVE
Spec Grav, UA: <=1.005
Urobilinogen, UA: NEGATIVE
pH, UA: 6

## 2016-04-13 NOTE — Progress Notes (Signed)
Location:  [facility] clinic Provider: Evamaria Detore L. Mariea Clonts, D.O., C.M.D.  Code Status: DNR Goals of Care:  Advanced Directives 03/08/2016  Does Patient Have a Medical Advance Directive? Yes  Type of Advance Directive Living will  Does patient want to make changes to medical advance directive? -  Copy of [place] in Chart? Yes   Chief Complaint  Patient presents with  . Acute Visit    UTI maybe    HPI: Patient is a 82 y.o. female seen today for an acute visit for some persistent urinary symptoms.  Says she is not sure if she has an infection again.  A couple of days ago, she had a little burning/discomfort so she called then.  It's been kind of intermittent in symptoms and better today.  Still drinking her water.  Urine dip was completely negative.  She got more pure cranberry juice also.  Did not get swelling in her legs with the compression hose.  Sleeping better--able to rest until 9am this morning.    Has a 4th toe on the left foot that is sore.  Has a callous she's been nursing off and on.  She's had surgery on two other toes.  Is using a sleeve on it to help decrease pressure when she's walking.  Past Medical History:  Diagnosis Date  . Cervicalgia   . Chest pain at rest 01/07/2012  . Displacement of cervical intervertebral disc without myelopathy   . Diverticulosis of colon (without mention of hemorrhage)   . Female stress incontinence   . Inguinal hernia without mention of obstruction or gangrene, unilateral or unspecified, (not specified as recurrent)   . Internal hemorrhoids without mention of complication   . Lumbago   . Macular degeneration (senile) of retina, unspecified   . Osteoarthrosis, unspecified whether generalized or localized, unspecified site   . Other and unspecified hyperlipidemia   . Pain in joint, hand   . Palpitations   . Reflux esophagitis   . Scoliosis (and kyphoscoliosis), idiopathic   . Senile osteoporosis   . Skin disorder   .  Thyroid disease   . Unruptured popliteal cyst 06/24/2014   Right knee   . Unspecified essential hypertension   . Unspecified glaucoma(365.9)   . Unspecified hypothyroidism   . Unspecified vitamin D deficiency     Past Surgical History:  Procedure Laterality Date  . ABDOMINAL HYSTERECTOMY  1975   Dr Mallie Mussel  . APPENDECTOMY  1988  . cardiolite myocardial perfusion study    . DOPPLER ECHOCARDIOGRAPHY    . EYE SURGERY Bilateral 2009   cataract removed right eye, Dr Charise Killian  . FOOT SURGERY  august 2013   Doran Durand, MD  . NM MYOVIEW LTD     negative  . TONSILLECTOMY  1937  . TOOTH EXTRACTION  09/16/13   Dr Carlos American    Allergies  Allergen Reactions  . Latex     To face  . Erythromycin   . Macrodantin [Nitrofurantoin Macrocrystal]   . Penicillins   . Septra [Sulfamethoxazole-Trimethoprim]   . Sulfa Antibiotics   . Trimethoprim       Medication List       Accurate as of 04/13/16  2:25 PM. Always use your most recent med list.          bimatoprost 0.01 % Soln Commonly known as:  LUMIGAN Place 1 drop into both eyes at bedtime.   calcium elemental as carbonate 400 MG chewable tablet Commonly known as:  BARIATRIC TUMS ULTRA  Chew 500 mg by mouth daily.   clopidogrel 75 MG tablet Commonly known as:  PLAVIX TAKE 1 TABLET ONCE A DAY TO PREVENT STROKE.   hydrocortisone cream 1 % Apply 1 application topically as needed for itching.   metoprolol succinate 25 MG 24 hr tablet Commonly known as:  TOPROL-XL One daily to regulate heart rate and control BP   pantoprazole 40 MG tablet Commonly known as:  PROTONIX TAKE 1 TAB DAILY FOR STOMACH ACID   PRESERVISION AREDS 2 PO Take by mouth 2 (two) times daily.   CENTRUM SILVER ULTRA WOMENS PO Take one tablet by mouth once daily for supplement   SYSTANE OP Place 1 drop into both eyes 2 (two) times daily. May use up to 3 times if needed.   Vitamin D3 2000 units Tabs Take 2,000 Units/day by mouth 2 (two) times daily.        Review of Systems:  Review of Systems  Constitutional: Negative for chills and fever.  HENT: Negative for congestion.   Eyes: Negative for blurred vision.       Glasses  Respiratory: Negative for cough and shortness of breath.   Cardiovascular: Positive for leg swelling. Negative for chest pain and palpitations.  Genitourinary: Negative for dysuria, frequency and urgency.  Musculoskeletal: Negative for joint pain.       Callous on toe  Skin: Negative for rash.  Neurological: Negative for dizziness.  Psychiatric/Behavioral: Negative for depression and memory loss. The patient does not have insomnia.     Health Maintenance  Topic Date Due  . MAMMOGRAM  04/27/2016  . TETANUS/TDAP  03/20/2020  . INFLUENZA VACCINE  Completed  . DEXA SCAN  Completed  . ZOSTAVAX  Completed  . PNA vac Low Risk Adult  Completed    Physical Exam: Vitals:   04/13/16 1404  BP: 128/60  Pulse: 67  Temp: 97.8 F (36.6 C)  TempSrc: Oral  SpO2: 98%  Weight: 124 lb (56.2 kg)   Body mass index is 22.68 kg/m. Physical Exam  Constitutional: She is oriented to person, place, and time. She appears well-developed and well-nourished. No distress.  Cardiovascular: Normal rate and regular rhythm.   Pulmonary/Chest: Effort normal and breath sounds normal.  Abdominal: Soft. Bowel sounds are normal.  Musculoskeletal: She exhibits no deformity.  Neurological: She is alert and oriented to person, place, and time.  Skin: Skin is warm and dry.  Callous on inferior aspect of her 4th toe on the left foot--it's tender to the touch; using sleeve    Labs reviewed: Basic Metabolic Panel:  Recent Labs  06/18/15 0922  NA 142  K 3.9  CL 102  CO2 27  GLUCOSE 85  BUN 18  CREATININE 0.64  CALCIUM 9.4  TSH 4.290   Liver Function Tests:  Recent Labs  06/18/15 0922  AST 21  ALT 17  ALKPHOS 70  BILITOT 0.4  PROT 6.5  ALBUMIN 4.1   No results for input(s): LIPASE, AMYLASE in the last 8760 hours. No  results for input(s): AMMONIA in the last 8760 hours. CBC: No results for input(s): WBC, NEUTROABS, HGB, HCT, MCV, PLT in the last 8760 hours. Lipid Panel:  Recent Labs  06/18/15 0922  CHOL 185  HDL 85  LDLCALC 82  TRIG 92  CHOLHDL 2.2   Lab Results  Component Value Date   HGBA1C 5.3 05/02/2013    Procedures since last visit: Ct Angio Abd/pel W/ And/or W/o  Result Date: 03/15/2016 CLINICAL DATA:  Right lower quadrant  pain EXAM: CTA ABDOMEN AND PELVIS wITHOUT AND WITH CONTRAST TECHNIQUE: Multidetector CT imaging of the abdomen and pelvis was performed using the standard protocol during bolus administration of intravenous contrast. Multiplanar reconstructed images and MIPs were obtained and reviewed to evaluate the vascular anatomy. CONTRAST:  80 cc Isovue 370 COMPARISON:  None. FINDINGS: VASCULAR Aorta: Aorta is non aneurysmal and patent with minimal atherosclerotic calcification. Celiac: Patent SMA: Patent Renals: Single right renal artery is patent. Two left renal arteries are patent. IMA: Patent. Inflow: Bilateral internal, external, and common iliac arteries are non aneurysmal and patent. There are markedly tortuous. Proximal Outflow: Visualize femoral arteries are grossly patent. Veins: Hepatic, portal, splenic, superior mesenteric, and renal veins are patent. Iliac veins are patent. Review of the MIP images confirms the above findings. NON-VASCULAR Lower chest: Dependent atelectasis. Small right Bochdalek's hernia containing fat. Hepatobiliary: Liver and gallbladder are within normal limits. Pancreas: Unremarkable Spleen: Unremarkable Adrenals/Urinary Tract: Adrenal glands are within normal limits. Mild bilateral hydronephrosis is likely related to bladder distention. The bladder is markedly distended. Stomach/Bowel: Diverticulosis of the sigmoid colon. Moderate stool burden throughout the colon. No evidence of small-bowel obstruction. Right inguinal hernia contains small bowel loops.  Lymphatic: No abnormal retroperitoneal adenopathy Reproductive: Uterus is absent.  Adnexa are unremarkable Other: No free-fluid Musculoskeletal: Levoscoliosis of the lumbar spine. No vertebral compression deformity. Advanced degenerative changes. IMPRESSION: VASCULAR No acute vascular pathology.  Atherosclerotic changes are noted. NON-VASCULAR Right inguinal hernia contains small bowel loops without evidence of obstruction. Electronically Signed   By: Marybelle Killings M.D.   On: 03/15/2016 12:33    Assessment/Plan 1. Dysuria -counseled on hydration, lubrication, hygiene - POC Urinalysis Dipstick was now entirely negative for infection and she had no other residual symptoms after abx completed -seems she just wanted confirmation that she was better from the UTI  2. Pre-ulcerative corn or callous -counseled on moisturizing and cont use of sleeve -if she wants, Dr. Nyoka Cowden or podiatry may debride it, but I don't feel comfortable doing that  3. Essential hypertension -bp well controlled, cont same regimen and monitor  Labs/tests ordered:   Orders Placed This Encounter  Procedures  . POC Urinalysis Dipstick    Next appt:  06/23/2016  Zeina Akkerman L. Gracen Southwell, D.O. [facility] [address], [place] [address] Cell Phone (Mon-Fri 8am-5pm):  [phone] On Call:  [phone] & follow prompts after 5pm & weekends Office Phone:  [phone] Office Fax:  [phone]

## 2016-04-14 ENCOUNTER — Encounter: Payer: Self-pay | Admitting: Cardiovascular Disease

## 2016-04-14 ENCOUNTER — Ambulatory Visit (INDEPENDENT_AMBULATORY_CARE_PROVIDER_SITE_OTHER): Payer: Medicare Other | Admitting: Cardiovascular Disease

## 2016-04-14 VITALS — BP 134/78 | HR 69 | Ht 61.0 in | Wt 122.4 lb

## 2016-04-14 DIAGNOSIS — I10 Essential (primary) hypertension: Secondary | ICD-10-CM | POA: Diagnosis not present

## 2016-04-14 DIAGNOSIS — E7800 Pure hypercholesterolemia, unspecified: Secondary | ICD-10-CM | POA: Diagnosis not present

## 2016-04-14 NOTE — Patient Instructions (Signed)
Medication Instructions: Your physician recommends that you continue on your current medications as directed. Please refer to the Current Medication list given to you today.  Labwork:  I will call to request labs from Dr. Nyoka Cowden.  Follow-Up: Your physician wants you to follow-up in: 1 year with [name]. You will receive a reminder letter in the mail two months in advance. If you don't receive a letter, please call our office to schedule the follow-up appointment.  If you need a refill on your cardiac medications before your next appointment, please call your pharmacy.

## 2016-04-14 NOTE — Assessment & Plan Note (Signed)
History of hyperlipidemia not on statin therapy with recent lipid profile performed 06/18/15 revealing an LDL of 82 and HDL of 85.

## 2016-04-14 NOTE — Assessment & Plan Note (Signed)
History of hypertension with blood pressure measured 134/38. She is on metoprolol. Continue current meds at current dosing

## 2016-04-14 NOTE — Progress Notes (Signed)
04/14/2016 [place]   08-23-1928  BD:[id number]  Primary Physician Jeanmarie Hubert, MD Primary Cardiologist: Lorretta Harp MD Renae Gloss  HPI:  The patient is an 82 year old thin appearing widowed Caucasian female mother of 2, grandmother of 2 grandchildren who I last saw in the office 03/30/15.Marland Kitchen Her only symptoms at that time were palpitations on a low-dose beta blocker. She also had a history of GERD. She was admitted to [facility] on January 06, 2013 with chest pain. She ruled out for myocardial infarction. She had a Myoview stress test which was normal. She had no recurrent symptoms.she also had TIA type symptoms last year and was diagnosed with a small "cerebral aneurysm "conservative medical therapy was recommended. She follows up with a neurologist and has been asymptomatic. She does have left shoulder issues and apparently since seen Dr. Onnie Graham was recommended total shoulder replacement. Since I saw her a year ago she is remaining clinically stable. She denies chest pain or shortness of breath. She recently returned from [place] for her granddaughter's wedding.   Current Outpatient Prescriptions  Medication Sig Dispense Refill  . bimatoprost (LUMIGAN) 0.01 % SOLN Place 1 drop into both eyes at bedtime.    . calcium elemental as carbonate (BARIATRIC TUMS ULTRA) 400 MG tablet Chew 500 mg by mouth daily.     . Cholecalciferol (VITAMIN D3) 2000 UNITS TABS Take 2,000 Units/day by mouth 2 (two) times daily.    . clopidogrel (PLAVIX) 75 MG tablet TAKE 1 TABLET ONCE A DAY TO PREVENT STROKE. 90 tablet 4  . hydrocortisone cream 1 % Apply 1 application topically as needed for itching.    . metoprolol succinate (TOPROL-XL) 25 MG 24 hr tablet One daily to regulate heart rate and control BP 90 tablet 3  . Multiple Vitamins-Minerals (CENTRUM SILVER ULTRA WOMENS PO) Take one tablet by mouth once daily for supplement    . Multiple Vitamins-Minerals (PRESERVISION AREDS 2 PO) Take  by mouth 2 (two) times daily.    . pantoprazole (PROTONIX) 40 MG tablet TAKE 1 TAB DAILY FOR STOMACH ACID 90 tablet 1  . Polyethyl Glycol-Propyl Glycol (SYSTANE OP) Place 1 drop into both eyes 2 (two) times daily. May use up to 3 times if needed.     No current facility-administered medications for this visit.     Allergies  Allergen Reactions  . Latex     To face  . Erythromycin   . Macrodantin [Nitrofurantoin Macrocrystal]   . Penicillins   . Septra [Sulfamethoxazole-Trimethoprim]   . Sulfa Antibiotics   . Trimethoprim     Social History   Social History  . Marital status: Widowed    Spouse name: N/A  . Number of children: 2  . Years of education: college   Occupational History  .      retired   Social History Main Topics  . Smoking status: Never Smoker  . Smokeless tobacco: Never Used  . Alcohol use No  . Drug use: No  . Sexual activity: No   Other Topics Concern  . Not on file   Social History Narrative   Patient lives at home with her daughter [name]) , moved to [facility] 02/02/16 [place]    Widowed.   Retired.   EducationNurse, mental health.   Right handed.   Caffeine- None some times tea very rare.   Never smoked   Alcohol none     Review of Systems: General: negative for chills, fever,  night sweats or weight changes.  Cardiovascular: negative for chest pain, dyspnea on exertion, edema, orthopnea, palpitations, paroxysmal nocturnal dyspnea or shortness of breath Dermatological: negative for rash Respiratory: negative for cough or wheezing Urologic: negative for hematuria Abdominal: negative for nausea, vomiting, diarrhea, bright red blood per rectum, melena, or hematemesis Neurologic: negative for visual changes, syncope, or dizziness All other systems reviewed and are otherwise negative except as noted above.    Blood pressure 134/78, pulse 69, height 5\' 1"  (1.549 m), weight 122 lb 6.4 oz (55.5 kg).  General appearance: alert and no  distress Neck: no adenopathy, no carotid bruit, no JVD, supple, symmetrical, trachea midline and thyroid not enlarged, symmetric, no tenderness/mass/nodules Lungs: clear to auscultation bilaterally Heart: regular rate and rhythm, S1, S2 normal, no murmur, click, rub or gallop Extremities: extremities normal, atraumatic, no cyanosis or edema  EKG sinus rhythm 69 without ST or T-wave changes. I personally reviewed this EKG  ASSESSMENT AND PLAN:   Essential hypertension History of hypertension with blood pressure measured 134/38. She is on metoprolol. Continue current meds at current dosing  Hyperlipidemia History of hyperlipidemia not on statin therapy with recent lipid profile performed 06/18/15 revealing an LDL of 82 and HDL of 85.      Lorretta Harp MD FACP,FACC,FAHA, [facility] 04/14/2016 10:12 AM

## 2016-04-28 DIAGNOSIS — Z1231 Encounter for screening mammogram for malignant neoplasm of breast: Secondary | ICD-10-CM | POA: Diagnosis not present

## 2016-04-28 LAB — HM MAMMOGRAPHY

## 2016-05-01 ENCOUNTER — Encounter: Payer: Self-pay | Admitting: *Deleted

## 2016-05-23 ENCOUNTER — Encounter: Payer: Self-pay | Admitting: Internal Medicine

## 2016-05-23 ENCOUNTER — Non-Acute Institutional Stay: Payer: Medicare Other | Admitting: Internal Medicine

## 2016-05-23 VITALS — BP 128/72 | HR 74 | Temp 98.9°F | Ht 61.0 in | Wt 120.0 lb

## 2016-05-23 DIAGNOSIS — I10 Essential (primary) hypertension: Secondary | ICD-10-CM | POA: Diagnosis not present

## 2016-05-23 DIAGNOSIS — J111 Influenza due to unidentified influenza virus with other respiratory manifestations: Secondary | ICD-10-CM | POA: Diagnosis not present

## 2016-05-23 MED ORDER — DM-GUAIFENESIN ER 30-600 MG PO TB12: ORAL_TABLET | ORAL | 2 refills | Status: DC

## 2016-05-23 MED ORDER — OSELTAMIVIR PHOSPHATE 75 MG PO CAPS: ORAL_CAPSULE | ORAL | 0 refills | Status: DC

## 2016-05-23 NOTE — Progress Notes (Signed)
Facility  [facility]    [name]: Clinic (12)     Allergies  Allergen Reactions  . Latex     To face  . Erythromycin   . Macrodantin [Nitrofurantoin Macrocrystal]   . Penicillins   . Septra [Sulfamethoxazole-Trimethoprim]   . Sulfa Antibiotics   . Trimethoprim     Chief Complaint  Patient presents with  . Acute Visit    cough, sore throat for several days. Last night fever 100.8    HPI:  Sick for the last 3 days. Fever to 100.8 last night.  Cough with minimal sputum. Throat is sore. There is aresident on her hall that has the flu.  BP is OK.    Medications: Patient's Medications  New Prescriptions   No medications on file  Previous Medications   BIMATOPROST (LUMIGAN) 0.01 % SOLN    Place 1 drop into both eyes at bedtime.   CALCIUM ELEMENTAL AS CARBONATE (BARIATRIC TUMS ULTRA) 400 MG TABLET    Chew 500 mg by mouth daily.    CHOLECALCIFEROL (VITAMIN D3) 2000 UNITS TABS    Take 2,000 Units/day by mouth 2 (two) times daily.   CLOPIDOGREL (PLAVIX) 75 MG TABLET    TAKE 1 TABLET ONCE A DAY TO PREVENT STROKE.   HYDROCORTISONE CREAM 1 %    Apply 1 application topically as needed for itching.   METOPROLOL SUCCINATE (TOPROL-XL) 25 MG 24 HR TABLET    One daily to regulate heart rate and control BP   MULTIPLE VITAMINS-MINERALS (CENTRUM SILVER ULTRA WOMENS PO)    Take one tablet by mouth once daily for supplement   MULTIPLE VITAMINS-MINERALS (PRESERVISION AREDS 2 PO)    Take by mouth 2 (two) times daily.   PANTOPRAZOLE (PROTONIX) 40 MG TABLET    TAKE 1 TAB DAILY FOR STOMACH ACID   POLYETHYL GLYCOL-PROPYL GLYCOL (SYSTANE OP)    Place 1 drop into both eyes 2 (two) times daily. May use up to 3 times if needed.  Modified Medications   No medications on file  Discontinued Medications   No medications on file     Review of Systems  Constitutional: Positive for fatigue. Negative for activity change, appetite change, chills, diaphoresis, fever and unexpected weight change.    HENT: Positive for congestion, postnasal drip, rhinorrhea and sore throat. Negative for ear discharge, ear pain, hearing loss, tinnitus, trouble swallowing and voice change.   Eyes: Negative for pain, redness, itching and visual disturbance.       Left cataract. Dry macular degeneration.  Respiratory: Positive for cough. Negative for choking, shortness of breath and wheezing.   Cardiovascular: Positive for leg swelling. Negative for chest pain and palpitations.  Gastrointestinal: Positive for abdominal pain (RQ lasting about 2-4 hours on 2 occassions.). Negative for abdominal distention, constipation, diarrhea and nausea.       Occasional reflux and heartburn for which she uses Pantoprazole and Tums.  Endocrine: Negative for cold intolerance, heat intolerance, polydipsia, polyphagia and polyuria.  Genitourinary: Negative for difficulty urinating, dysuria, flank pain, frequency, hematuria, pelvic pain, urgency and vaginal discharge.       Stress incontinence.  Musculoskeletal: Positive for myalgias. Negative for arthralgias, back pain, gait problem, neck pain and neck stiffness.       Some joint pains. Bilateral shoulder discomfort; worse in the right shoulder.  Skin: Negative for color change, pallor and rash.       Previous Painful corns and calluses are improved on the sole of the left foot.  Neurological: Negative for dizziness, tremors, seizures, syncope, weakness, light-headedness, numbness and headaches.  Hematological: Negative for adenopathy. Does not bruise/bleed easily.  Psychiatric/Behavioral: Negative for agitation, behavioral problems, confusion, dysphoric mood, hallucinations, sleep disturbance and suicidal ideas. The patient is not nervous/anxious and is not hyperactive.     Vitals:   05/23/16 1146  BP: 128/72  Pulse: 74  Temp: 98.9 F (37.2 C)  TempSrc: Oral  SpO2: 95%  Weight: 120 lb (54.4 kg)  Height: _0  (1.549 m)   Wt Readings from Last 3 Encounters:  05/23/16  120 lb (54.4 kg)  04/14/16 122 lb 6.4 oz (55.5 kg)  04/13/16 124 lb (56.2 kg)    Body mass index is 22.67 kg/m.  Physical Exam  Constitutional: She is oriented to person, place, and time. She appears well-developed and well-nourished. No distress.  HENT:  Head: Normocephalic and atraumatic.  Right Ear: External ear normal.  Left Ear: External ear normal.  Nose: Nose normal.  Mouth/Throat: Oropharynx is clear and moist.  Sinus and head congestion.  Pharnyx is red, but no purulence.  Eyes: Conjunctivae are normal. Pupils are equal, round, and reactive to light. Left eye exhibits no discharge.  Neck: No JVD present. No tracheal deviation present. No thyromegaly present.  Cardiovascular: Normal rate, regular rhythm, normal heart sounds and intact distal pulses.  Exam reveals no gallop and no friction rub.   No murmur heard. Pulmonary/Chest: No respiratory distress. She has no wheezes. She has no rales. She exhibits no tenderness.  Abdominal: She exhibits no distension and no mass. There is no tenderness.  Right inguinal hernia unchanged. Unable to elicit any point tenderness in the abdomen or feel a mass.  Musculoskeletal: She exhibits no edema or tenderness.  Limited ability to raise arms above his shoulders.  Lymphadenopathy:    She has no cervical adenopathy.  Neurological: She is alert and oriented to person, place, and time. She has normal reflexes. No cranial nerve deficit. Coordination normal.  Skin: No rash noted. No erythema. No pallor.  2 painful corns of the sole of the left foot. Painful wart on the left heel.  Psychiatric: She has a normal mood and affect. Her behavior is normal. Judgment and thought content normal.     Labs reviewed: Lab Summary Latest Ref Rng & Units 06/18/2015  Hemoglobin 13.0-17.0 g/dL (None)  Hematocrit 39.0-52.0 % (None)  White count - (None)  Platelet count - (None)  Sodium 134 - 144 mmol/L 142  Potassium 3.5 - 5.2 mmol/L 3.9  Calcium 8.7 -  10.3 mg/dL 9.4  Phosphorus - (None)  Creatinine 0.57 - 1.00 mg/dL 0.64  AST 0 - 40 IU/L 21  Alk Phos 39 - 117 IU/L 70  Bilirubin 0.0 - 1.2 mg/dL 0.4  Glucose 65 - 99 mg/dL 85  Cholesterol - (None)  HDL cholesterol >39 mg/dL 85  Triglycerides 0 - 149 mg/dL 92  LDL Direct - (None)  LDL Calc 0 - 99 mg/dL 82  Total protein - (None)  Albumin 3.5 - 4.7 g/dL 4.1  Some recent data might be hidden   Lab Results  Component Value Date   TSH 4.290 06/18/2015   Lab Results  Component Value Date   BUN 18 06/18/2015   BUN 24 06/22/2014   BUN 20 05/01/2013   Lab Results  Component Value Date   CREATININE 0.64 06/18/2015   CREATININE 0.69 06/22/2014   CREATININE 0.90 05/01/2013   Lab Results  Component Value Date   HGBA1C 5.3 05/02/2013  Assessment/Plan  1. Influenza -Tamiflu 82m bid x 5 days  2. Essential hypertension controlled

## 2016-05-29 DIAGNOSIS — H353132 Nonexudative age-related macular degeneration, bilateral, intermediate dry stage: Secondary | ICD-10-CM | POA: Diagnosis not present

## 2016-05-29 DIAGNOSIS — H52203 Unspecified astigmatism, bilateral: Secondary | ICD-10-CM | POA: Diagnosis not present

## 2016-05-29 DIAGNOSIS — H401132 Primary open-angle glaucoma, bilateral, moderate stage: Secondary | ICD-10-CM | POA: Diagnosis not present

## 2016-05-29 DIAGNOSIS — Z961 Presence of intraocular lens: Secondary | ICD-10-CM | POA: Diagnosis not present

## 2016-06-06 DIAGNOSIS — M1712 Unilateral primary osteoarthritis, left knee: Secondary | ICD-10-CM | POA: Diagnosis not present

## 2016-06-23 ENCOUNTER — Other Ambulatory Visit: Payer: Medicare Other

## 2016-06-23 DIAGNOSIS — E039 Hypothyroidism, unspecified: Secondary | ICD-10-CM | POA: Diagnosis not present

## 2016-06-23 DIAGNOSIS — I10 Essential (primary) hypertension: Secondary | ICD-10-CM

## 2016-06-23 DIAGNOSIS — E785 Hyperlipidemia, unspecified: Secondary | ICD-10-CM | POA: Diagnosis not present

## 2016-06-23 LAB — COMPREHENSIVE METABOLIC PANEL
ALT: 16 U/L (ref 6–29)
AST: 20 U/L (ref 10–35)
Albumin: 3.8 g/dL (ref 3.6–5.1)
Alkaline Phosphatase: 62 U/L (ref 33–130)
BUN: 22 mg/dL (ref 7–25)
CHLORIDE: 103 mmol/L (ref 98–110)
CO2: 25 mmol/L (ref 20–31)
CREATININE: 0.68 mg/dL (ref 0.60–0.88)
Calcium: 9.1 mg/dL (ref 8.6–10.4)
Glucose, Bld: 85 mg/dL (ref 65–99)
Potassium: 4.1 mmol/L (ref 3.5–5.3)
SODIUM: 138 mmol/L (ref 135–146)
Total Bilirubin: 0.4 mg/dL (ref 0.2–1.2)
Total Protein: 6.5 g/dL (ref 6.1–8.1)

## 2016-06-23 LAB — LIPID PANEL
CHOL/HDL RATIO: 2.2 ratio (ref <5.0)
Cholesterol: 197 mg/dL (ref <200)
HDL: 90 mg/dL (ref >50)
LDL CALC: 90 mg/dL (ref <100)
Triglycerides: 87 mg/dL (ref <150)
VLDL: 17 mg/dL (ref <30)

## 2016-06-23 LAB — TSH: TSH: 3.87 mIU/L

## 2016-06-27 ENCOUNTER — Ambulatory Visit (INDEPENDENT_AMBULATORY_CARE_PROVIDER_SITE_OTHER): Payer: Medicare Other | Admitting: Internal Medicine

## 2016-06-27 ENCOUNTER — Encounter: Payer: Self-pay | Admitting: Internal Medicine

## 2016-06-27 ENCOUNTER — Ambulatory Visit (INDEPENDENT_AMBULATORY_CARE_PROVIDER_SITE_OTHER): Payer: Medicare Other

## 2016-06-27 VITALS — BP 116/70 | HR 64 | Temp 97.4°F | Ht 61.0 in | Wt 119.8 lb

## 2016-06-27 VITALS — BP 116/70 | HR 64 | Temp 97.4°F | Ht 61.0 in | Wt 119.0 lb

## 2016-06-27 DIAGNOSIS — L84 Corns and callosities: Secondary | ICD-10-CM | POA: Diagnosis not present

## 2016-06-27 DIAGNOSIS — Z0000 Encounter for general adult medical examination without abnormal findings: Secondary | ICD-10-CM

## 2016-06-27 DIAGNOSIS — J111 Influenza due to unidentified influenza virus with other respiratory manifestations: Secondary | ICD-10-CM | POA: Diagnosis not present

## 2016-06-27 DIAGNOSIS — H9193 Unspecified hearing loss, bilateral: Secondary | ICD-10-CM

## 2016-06-27 DIAGNOSIS — G459 Transient cerebral ischemic attack, unspecified: Secondary | ICD-10-CM | POA: Diagnosis not present

## 2016-06-27 DIAGNOSIS — E039 Hypothyroidism, unspecified: Secondary | ICD-10-CM

## 2016-06-27 DIAGNOSIS — I10 Essential (primary) hypertension: Secondary | ICD-10-CM

## 2016-06-27 DIAGNOSIS — E7800 Pure hypercholesterolemia, unspecified: Secondary | ICD-10-CM | POA: Diagnosis not present

## 2016-06-27 DIAGNOSIS — M810 Age-related osteoporosis without current pathological fracture: Secondary | ICD-10-CM | POA: Diagnosis not present

## 2016-06-27 DIAGNOSIS — R002 Palpitations: Secondary | ICD-10-CM | POA: Diagnosis not present

## 2016-06-27 DIAGNOSIS — M533 Sacrococcygeal disorders, not elsewhere classified: Secondary | ICD-10-CM | POA: Diagnosis not present

## 2016-06-27 DIAGNOSIS — R609 Edema, unspecified: Secondary | ICD-10-CM | POA: Diagnosis not present

## 2016-06-27 DIAGNOSIS — K219 Gastro-esophageal reflux disease without esophagitis: Secondary | ICD-10-CM | POA: Diagnosis not present

## 2016-06-27 MED ORDER — CLOPIDOGREL BISULFATE 75 MG PO TABS: ORAL_TABLET | ORAL | 3 refills | Status: DC

## 2016-06-27 MED ORDER — PANTOPRAZOLE SODIUM 40 MG PO TBEC: DELAYED_RELEASE_TABLET | ORAL | 1 refills | Status: DC

## 2016-06-27 NOTE — Patient Instructions (Addendum)
[name] , Thank you for taking time to come for your Medicare Wellness Visit. I appreciate your ongoing commitment to your health goals. Please review the following plan we discussed and let me know if I can assist you in the future.   These are the goals we discussed: Goals    . Increase physical activity          Starting 06/27/16, I will attempt to increase my physical activity.        This is a list of the screening recommended for you and due dates:  Health Maintenance  Topic Date Due  . Mammogram  04/28/2017  . Tetanus Vaccine  03/20/2020  . Flu Shot  Completed  . DEXA scan (bone density measurement)  Completed  . Shingles Vaccine  Completed  . Pneumonia vaccines  Completed  Preventive Care for Adults  A healthy lifestyle and preventive care can promote health and wellness. Preventive health guidelines for adults include the following key practices.  . A routine yearly physical is a good way to check with your health care provider about your health and preventive screening. It is a chance to share any concerns and updates on your health and to receive a thorough exam.  . Visit your dentist for a routine exam and preventive care every 6 months. Brush your teeth twice a day and floss once a day. Good oral hygiene prevents tooth decay and gum disease.  . The frequency of eye exams is based on your age, health, family medical history, use  of contact lenses, and other factors. Follow your health care provider's ecommendations for frequency of eye exams.  . Eat a healthy diet. Foods like vegetables, fruits, whole grains, low-fat dairy products, and lean protein foods contain the nutrients you need without too many calories. Decrease your intake of foods high in solid fats, added sugars, and salt. Eat the right amount of calories for you. Get information about a proper diet from your health care provider, if necessary.  . Regular physical exercise is one of the most important  things you can do for your health. Most adults should get at least 150 minutes of moderate-intensity exercise (any activity that increases your heart rate and causes you to sweat) each week. In addition, most adults need muscle-strengthening exercises on 2 or more days a week.  Silver Sneakers may be a benefit available to you. To determine eligibility, you may visit the website: www.silversneakers.com or contact program at (339)272-6914 Mon-Fri between 8AM-8PM.   . Maintain a healthy weight. The body mass index (BMI) is a screening tool to identify possible weight problems. It provides an estimate of body fat based on height and weight. Your health care provider can find your BMI and can help you achieve or maintain a healthy weight.   For adults 20 years and older: ? A BMI below 18.5 is considered underweight. ? A BMI of 18.5 to 24.9 is normal. ? A BMI of 25 to 29.9 is considered overweight. ? A BMI of 30 and above is considered obese.   . Maintain normal blood lipids and cholesterol levels by exercising and minimizing your intake of saturated fat. Eat a balanced diet with plenty of fruit and vegetables. Blood tests for lipids and cholesterol should begin at age 10 and be repeated every 5 years. If your lipid or cholesterol levels are high, you are over 50, or you are at high risk for heart disease, you may need your cholesterol levels checked more  frequently. Ongoing high lipid and cholesterol levels should be treated with medicines if diet and exercise are not working.  . If you smoke, find out from your health care provider how to quit. If you do not use tobacco, please do not start.  . If you choose to drink alcohol, please do not consume more than 2 drinks per day. One drink is considered to be 12 ounces (355 mL) of beer, 5 ounces (148 mL) of wine, or 1.5 ounces (44 mL) of liquor.  . If you are 43-18 years old, ask your health care provider if you should take aspirin to prevent  strokes.  . Use sunscreen. Apply sunscreen liberally and repeatedly throughout the day. You should seek shade when your shadow is shorter than you. Protect yourself by wearing long sleeves, pants, a wide-brimmed hat, and sunglasses year round, whenever you are outdoors.  . Once a month, do a whole body skin exam, using a mirror to look at the skin on your back. Tell your health care provider of new moles, moles that have irregular borders, moles that are larger than a pencil eraser, or moles that have changed in shape or color.

## 2016-06-27 NOTE — Progress Notes (Signed)
Subjective:   [name] is a 83 y.o. female who presents for an Initial Medicare Annual Wellness Visit.  Review of Systems     Cardiac Risk Factors include: advanced age (>34men, >72 women);dyslipidemia;family history of premature cardiovascular disease;hypertension     Objective:    Today's Vitals   06/27/16 1404  BP: 116/70  Pulse: 64  Temp: 97.4 F (36.3 C)  TempSrc: Oral  SpO2: 99%  Weight: 119 lb 12.8 oz (54.3 kg)  Height: 5\' 1"  (1.549 m)   Body mass index is 22.64 kg/m.   Current Medications (verified) Outpatient Encounter Prescriptions as of 06/27/2016  Medication Sig  . bimatoprost (LUMIGAN) 0.01 % SOLN Place 1 drop into both eyes at bedtime.  . calcium elemental as carbonate (BARIATRIC TUMS ULTRA) 400 MG tablet Chew 500 mg by mouth daily.   . Cholecalciferol (VITAMIN D3) 2000 UNITS TABS Take 2,000 Units/day by mouth 2 (two) times daily.  . clopidogrel (PLAVIX) 75 MG tablet TAKE 1 TABLET ONCE A DAY TO PREVENT STROKE.  . hydrocortisone cream 1 % Apply 1 application topically as needed for itching.  . metoprolol succinate (TOPROL-XL) 25 MG 24 hr tablet One daily to regulate heart rate and control BP  . Multiple Vitamins-Minerals (CENTRUM SILVER ULTRA WOMENS PO) Take one tablet by mouth once daily for supplement  . Multiple Vitamins-Minerals (PRESERVISION AREDS 2 PO) Take by mouth 2 (two) times daily.  . pantoprazole (PROTONIX) 40 MG tablet TAKE 1 TAB DAILY FOR STOMACH ACID  . Polyethyl Glycol-Propyl Glycol (SYSTANE OP) Place 1 drop into both eyes 2 (two) times daily. May use up to 3 times if needed.  . [DISCONTINUED] dextromethorphan-guaiFENesin (MUCINEX DM) 30-600 MG 12hr tablet One twice daily to control cough and to thin mucus  . [DISCONTINUED] oseltamivir (TAMIFLU) 75 MG capsule Take one capsule twice daily to treat the flu   No facility-administered encounter medications on file as of 06/27/2016.     Allergies (verified) Latex; Erythromycin;  Macrodantin [nitrofurantoin macrocrystal]; Penicillins; Septra [sulfamethoxazole-trimethoprim]; Sulfa antibiotics; and Trimethoprim   History: Past Medical History:  Diagnosis Date  . Cervicalgia   . Chest pain at rest 01/07/2012  . Displacement of cervical intervertebral disc without myelopathy   . Diverticulosis of colon (without mention of hemorrhage)   . Female stress incontinence   . Inguinal hernia without mention of obstruction or gangrene, unilateral or unspecified, (not specified as recurrent)   . Internal hemorrhoids without mention of complication   . Lumbago   . Macular degeneration (senile) of retina, unspecified   . Osteoarthrosis, unspecified whether generalized or localized, unspecified site   . Other and unspecified hyperlipidemia   . Pain in joint, hand   . Palpitations   . Reflux esophagitis   . Scoliosis (and kyphoscoliosis), idiopathic   . Senile osteoporosis   . Skin disorder   . Thyroid disease   . Unruptured popliteal cyst 06/24/2014   Right knee   . Unspecified essential hypertension   . Unspecified glaucoma(365.9)   . Unspecified hypothyroidism   . Unspecified vitamin D deficiency    Past Surgical History:  Procedure Laterality Date  . ABDOMINAL HYSTERECTOMY  1975   Dr Mallie Mussel  . APPENDECTOMY  1988  . cardiolite myocardial perfusion study    . DOPPLER ECHOCARDIOGRAPHY    . EYE SURGERY Bilateral 2009   cataract removed right eye, Dr Charise Killian  . FOOT SURGERY  august 2013   Doran Durand, MD  . [facility] MYOVIEW [facility]TD     negative  .  TONSILLECTOMY  1937  . TOOTH EXTRACTION  09/16/13   Dr Carlos American   Family History  Problem Relation Age of Onset  . CAD Mother   . Parkinson's disease Mother   . Cancer Father     colon cancer  . Parkinson's disease Father   . Stroke Maternal Grandmother    Social History   Occupational History  .      retired   Social History Main Topics  . Smoking status: Never Smoker  . Smokeless tobacco: Never Used  . Alcohol use No  .  Drug use: No  . Sexual activity: No    Tobacco Counseling Counseling given: No   Activities of Daily Living In your present state of health, do you have any difficulty performing the following activities: 06/27/2016  Hearing? Y  Vision? N  Difficulty concentrating or making decisions? N  Walking or climbing stairs? N  Dressing or bathing? N  Doing errands, shopping? N  Preparing Food and eating ? N  Using the Toilet? N  In the past six months, have you accidently leaked urine? Y  Do you have problems with loss of bowel control? Y  Managing your Medications? N  Managing your Finances? N  Housekeeping or managing your Housekeeping? Y  Some recent data might be hidden    Immunizations and Health Maintenance Immunization History  Administered Date(s) Administered  . Influenza Whole 02/24/2011, 02/12/2012  . Influenza,inj,Quad PF,36+ Mos 02/20/2013, 02/10/2014, 02/19/2015, 02/18/2016  . Pneumococcal Conjugate-13 06/24/2014  . Pneumococcal Polysaccharide-23 04/12/1998  . Td 02/21/1996, 07/23/2003  . Tdap 03/20/2010  . Zoster 09/15/2005   There are no preventive care reminders to display for this patient.  Patient Care Team: Estill Dooms, MD as PCP - General (Internal Medicine) Laurence Spates, MD as Consulting Physician (Gastroenterology) Myrlene Broker, MD as Attending Physician (Urology) Lorretta Harp, MD as Consulting Physician (Cardiology) Rozetta Nunnery, MD as Consulting Physician (Otolaryngology) Richarda Osmond, DDS (Dentistry) Shon Hough, MD as Consulting Physician (Ophthalmology) Iran Planas, MD as Consulting Physician (Orthopedic Surgery)  Indicate any recent Medical Services you may have received from other than [facility] providers in the past year (date may be approximate).     Assessment:   This is a routine wellness examination for [facility].  Hearing/Vision screen Hearing Screening Comments: Pt has not had a hearing screen in several years.  Denies any hearing loss at this time; would like to have a referral for audiology.  Vision Screening Comments: Last eye exam was done with Dr. Kathrin Penner in Jan. 2018.  Dietary issues and exercise activities discussed: Current Exercise Habits: The patient does not participate in regular exercise at present  Goals    . Increase physical activity          Starting 06/27/16, I will attempt to increase my physical activity.       Depression Screen PHQ 2/9 Scores 06/27/2016 04/13/2016 11/08/2015 12/30/2014 12/24/2013 12/24/2012  PHQ - 2 Score 0 0 0 0 0 0    Fall Risk Fall Risk  06/27/2016 04/13/2016 11/11/2015 11/08/2015 06/22/2015  Falls in the past year? No No No Yes No  Number falls in past yr: - - - 1 -  Injury with Fall? - - - No -  Risk for fall due to : - - - - -  Follow up - - - Falls evaluation completed -    Cognitive Function: MMSE - Mini Mental State Exam 06/27/2016 06/22/2015 06/24/2014  Not completed: - (No Data) -  Orientation to time 5 5 5   Orientation to Place 5 5 5   Registration 3 3 3   Attention/ Calculation 5 5 5   Recall 1 1 1   Language- name 2 objects 2 2 2   Language- repeat 1 1 1   Language- follow 3 step command 3 3 3   Language- read & follow direction 1 1 1   Write a sentence 1 1 1   Copy design 1 1 1   Total score 28 28 28         Screening Tests Health Maintenance  Topic Date Due  . MAMMOGRAM  04/28/2017  . TETANUS/TDAP  03/20/2020  . INFLUENZA VACCINE  Completed  . DEXA SCAN  Completed  . ZOSTAVAX  Completed  . PNA vac Low Risk Adult  Completed      Plan:    I have personally reviewed and addressed the Medicare Annual Wellness questionnaire and have noted the following in the patient's chart:  A. Medical and social history B. Use of alcohol, tobacco or illicit drugs  C. Current medications and supplements D. Functional ability and status E.  Nutritional status F.  Physical activity G. Advance directives H. List of other physicians I.    Hospitalizations, surgeries, and ER visits in previous 12 months J.  [place] to include hearing, vision, cognitive, depression L. Referrals and appointments - none  In addition, I have reviewed and discussed with patient certain preventive protocols, quality metrics, and best practice recommendations. A written personalized care plan for preventive services as well as general preventive health recommendations were provided to patient.  See attached scanned questionnaire for additional information.   Signed,   Allyn Kenner, LPN Health Advisor    I have reviewed the information entered by the Health Advisor. I was present in the office during the time of patient interaction and was available for consultation. I agree with the documentation and advice.  Viviann Spare Nyoka Cowden, MD

## 2016-06-27 NOTE — Progress Notes (Signed)
Facility  [place]    [name]:   OFFICE    Allergies  Allergen Reactions  . Latex     To face  . Erythromycin   . Macrodantin [Nitrofurantoin Macrocrystal]   . Penicillins   . Septra [Sulfamethoxazole-Trimethoprim]   . Sulfa Antibiotics   . Trimethoprim     Chief Complaint  Patient presents with  . Medical Management of Chronic Issues    Extended visit, MMSE (28/30)/passed clock drawing, EKG completed by cardiologist, and discuss labs (copy printed  . Medication Management    HPI:  Seen acutely 05/23/16 for influenza. Treated with Tamiflu. Resolved now except occasional residual cough.  Essential hypertension - controlled  Pure hypercholesterolemia - controlled  Hypothyroidism, unspecified type - compensated  Palpitations - continues with a few rapid palpiitaions. No associated chest pain or dyspnea.  Gastroesophageal reflux disease without esophagitis - asymptomatic. Wants to try getting off pantoprazole again. Last time she tried her indigestion returned quickly.  Corn - painful on the side of the lefet greast toe and at the fifth toes  Edema, unspecified type - improved  Influenza - resolved  Medications:e Patient's Medications  New Prescriptions   No medications on file  Previous Medications   BIMATOPROST (LUMIGAN) 0.01 % SOLN    Place 1 drop into both eyes at bedtime.   CALCIUM ELEMENTAL AS CARBONATE (BARIATRIC TUMS ULTRA) 400 MG TABLET    Chew 500 mg by mouth daily.    CHOLECALCIFEROL (VITAMIN D3) 2000 UNITS TABS    Take 2,000 Units/day by mouth 2 (two) times daily.   CLOPIDOGREL (PLAVIX) 75 MG TABLET    TAKE 1 TABLET ONCE A DAY TO PREVENT STROKE.   HYDROCORTISONE CREAM 1 %    Apply 1 application topically as needed for itching.   METOPROLOL SUCCINATE (TOPROL-XL) 25 MG 24 HR TABLET    One daily to regulate heart rate and control BP   MULTIPLE VITAMINS-MINERALS (CENTRUM SILVER ULTRA WOMENS PO)    Take one tablet by mouth once daily for supplement   MULTIPLE VITAMINS-MINERALS (PRESERVISION AREDS 2 PO)    Take by mouth 2 (two) times daily.   PANTOPRAZOLE (PROTONIX) 40 MG TABLET    TAKE 1 TAB DAILY FOR STOMACH ACID   POLYETHYL GLYCOL-PROPYL GLYCOL (SYSTANE OP)    Place 1 drop into both eyes 2 (two) times daily. May use up to 3 times if needed.  Modified Medications   No medications on file  Discontinued Medications   No medications on file    Review of Systems  Constitutional: Positive for fatigue. Negative for activity change, appetite change, chills, diaphoresis, fever and unexpected weight change.  HENT: Positive for congestion, postnasal drip, rhinorrhea and sore throat. Negative for ear discharge, ear pain, hearing loss, tinnitus, trouble swallowing and voice change.   Eyes: Negative for pain, redness, itching and visual disturbance.       Left cataract. Dry macular degeneration.  Respiratory: Positive for cough. Negative for choking, shortness of breath and wheezing.   Cardiovascular: Positive for leg swelling. Negative for chest pain and palpitations.  Gastrointestinal: Positive for abdominal pain (RQ lasting about 2-4 hours on 2 occassions.). Negative for abdominal distention, constipation, diarrhea and nausea.       Occasional reflux and heartburn for which she uses Pantoprazole and Tums.  Endocrine: Negative for cold intolerance, heat intolerance, polydipsia, polyphagia and polyuria.  Genitourinary: Negative for difficulty urinating, dysuria, flank pain, frequency, hematuria, pelvic pain, urgency and vaginal discharge.  Stress incontinence.  Musculoskeletal: Positive for myalgias. Negative for arthralgias, back pain, gait problem, neck pain and neck stiffness.       Some joint pains. Bilateral shoulder discomfort; worse in the right shoulder.  Skin: Negative for color change, pallor and rash.       Recurrent painful corns and calluses on the sole of the left foot.  Neurological: Negative for dizziness, tremors, seizures,  syncope, weakness, light-headedness, numbness and headaches.  Hematological: Negative for adenopathy. Does not bruise/bleed easily.  Psychiatric/Behavioral: Negative for agitation, behavioral problems, confusion, dysphoric mood, hallucinations, sleep disturbance and suicidal ideas. The patient is not nervous/anxious and is not hyperactive.     Vitals:   06/27/16 1439  BP: 116/70  Pulse: 64  Temp: 97.4 F (36.3 C)  TempSrc: Oral  SpO2: 99%  Weight: 119 lb (54 kg)  Height: 5' 1" (1.549 m)   Body mass index is 22.48 kg/m. Wt Readings from Last 3 Encounters:  06/27/16 119 lb (54 kg)  06/27/16 119 lb 12.8 oz (54.3 kg)  05/23/16 120 lb (54.4 kg)      Physical Exam  Constitutional: She is oriented to person, place, and time. She appears well-developed and well-nourished. No distress.  HENT:  Head: Normocephalic and atraumatic.  Right Ear: External ear normal.  Left Ear: External ear normal.  Nose: Nose normal.  Mouth/Throat: Oropharynx is clear and moist.  Sinus and head congestion.  Pharnyx is red, but no purulence.  Eyes: Conjunctivae are normal. Pupils are equal, round, and reactive to light. Left eye exhibits no discharge.  Neck: No JVD present. No tracheal deviation present. No thyromegaly present.  Cardiovascular: Normal rate, regular rhythm, normal heart sounds and intact distal pulses.  Exam reveals no gallop and no friction rub.   No murmur heard. Pulmonary/Chest: No respiratory distress. She has no wheezes. She has no rales. She exhibits no tenderness.  Abdominal: She exhibits no distension and no mass. There is no tenderness.  Right inguinal hernia unchanged. Unable to elicit any point tenderness in the abdomen or feel a mass.  Musculoskeletal: She exhibits no edema or tenderness.  Limited ability to raise arms above his shoulders.  Lymphadenopathy:    She has no cervical adenopathy.  Neurological: She is alert and oriented to person, place, and time. She has  normal reflexes. No cranial nerve deficit. Coordination normal.  06/27/16 MMSE 28/30. Passed clock drawing.  Skin: No rash noted. No erythema. No pallor.  2 painful corns of the sole of the left foot.   Psychiatric: She has a normal mood and affect. Her behavior is normal. Judgment and thought content normal.    Labs reviewed: Lab Summary Latest Ref Rng & Units 06/23/2016 06/18/2015  Hemoglobin 13.0-17.0 g/dL (None) (None)  Hematocrit 39.0-52.0 % (None) (None)  White count - (None) (None)  Platelet count - (None) (None)  Sodium 135 - 146 mmol/L 138 142  Potassium 3.5 - 5.3 mmol/L 4.1 3.9  Calcium 8.6 - 10.4 mg/dL 9.1 9.4  Phosphorus - (None) (None)  Creatinine 0.60 - 0.88 mg/dL 0.68 0.64  AST 10 - 35 U/L 20 21  Alk Phos 33 - 130 U/L 62 70  Bilirubin 0.2 - 1.2 mg/dL 0.4 0.4  Glucose 65 - 99 mg/dL 85 85  Cholesterol <200 mg/dL 197 (None)  HDL cholesterol >50 mg/dL 90 85  Triglycerides <150 mg/dL 87 92  LDL Direct - (None) (None)  LDL Calc <100 mg/dL 90 82  Total protein 6.1 - 8.1 g/dL 6.5 (None)  Albumin 3.6 - 5.1 g/dL 3.8 4.1  Some recent data might be hidden   Lab Results  Component Value Date   TSH 3.87 06/23/2016   TSH 4.290 06/18/2015   TSH 4.110 06/22/2014   Lab Results  Component Value Date   BUN 22 06/23/2016   BUN 18 06/18/2015   BUN 24 06/22/2014   Lab Results  Component Value Date   HGBA1C 5.3 05/02/2013    Assessment/Plan  1. Essential hypertension Controlled. The current medical regimen is effective;  continue present plan and medications. - Basic metabolic panel; Future  2. Pure hypercholesterolemia Controlled without medication  3. Hypothyroidism, unspecified type The current medical regimen is effective;  continue present plan and medications.  4. Palpitations Continue to observe  5. Gastroesophageal reflux disease without esophagitis She will try stopping pantoprazole and start Zantac bid, reduce to qd after 1-2 weeks. If this fails, she will  resume pantoprazole (PROTONIX) 40 MG table  6. Corn Sharply debrided during visit. No anesthesia. Pain imroved after debridement.  7. Edema, unspecified type observe  8. Influenza resolved  9. Senile osteoporosis - HM DEXA SCAN  10. Pain of right sacroiliac joint improved  11. Transient cerebral ischemia, unspecified type - clopidogrel (PLAVIX) 75 MG tablet; TAKE 1 TABLET ONCE A DAY TO PREVENT STROKE.  Dispense: 90 tablet; Refill: 3

## 2016-06-27 NOTE — Patient Instructions (Signed)
Try to stop pantoprazole, but take ranitidine 150 mg at bed for at least 2 weeks after stopping the pantoprazole.

## 2016-06-27 NOTE — Progress Notes (Signed)
Quick Notes   Health Maintenance:  None    Abnormal Screen: MMSE-28/30 Passed Clock Test    Patient Concerns: She would like to discuss some articles she has found on some of the medications she currently takes. She has also had pain in both shoulders lately but she does not want to have the surgery. Pt would like to discuss cutting her Prilosec down from 40 mg to 20 mg.     Nurse Concerns:  None  I have reviewed the information entered by the Health Advisor. I was present in the office during the time of patient interaction and was available for consultation. I agree with the documentation and advice.  Viviann Spare Nyoka Cowden, MD

## 2016-07-12 DIAGNOSIS — H90A21 Sensorineural hearing loss, unilateral, right ear, with restricted hearing on the contralateral side: Secondary | ICD-10-CM | POA: Diagnosis not present

## 2016-07-12 DIAGNOSIS — H90A32 Mixed conductive and sensorineural hearing loss, unilateral, left ear with restricted hearing on the contralateral side: Secondary | ICD-10-CM | POA: Diagnosis not present

## 2016-07-31 DIAGNOSIS — M25512 Pain in left shoulder: Secondary | ICD-10-CM | POA: Diagnosis not present

## 2016-07-31 DIAGNOSIS — M48061 Spinal stenosis, lumbar region without neurogenic claudication: Secondary | ICD-10-CM | POA: Diagnosis not present

## 2016-07-31 DIAGNOSIS — M19011 Primary osteoarthritis, right shoulder: Secondary | ICD-10-CM | POA: Diagnosis not present

## 2016-08-08 DIAGNOSIS — M25512 Pain in left shoulder: Secondary | ICD-10-CM | POA: Diagnosis not present

## 2016-08-08 DIAGNOSIS — M48061 Spinal stenosis, lumbar region without neurogenic claudication: Secondary | ICD-10-CM | POA: Diagnosis not present

## 2016-08-08 DIAGNOSIS — M545 Low back pain: Secondary | ICD-10-CM | POA: Diagnosis not present

## 2016-08-15 DIAGNOSIS — M48061 Spinal stenosis, lumbar region without neurogenic claudication: Secondary | ICD-10-CM | POA: Diagnosis not present

## 2016-08-16 DIAGNOSIS — M545 Low back pain: Secondary | ICD-10-CM | POA: Diagnosis not present

## 2016-08-16 DIAGNOSIS — M48061 Spinal stenosis, lumbar region without neurogenic claudication: Secondary | ICD-10-CM | POA: Diagnosis not present

## 2016-08-16 DIAGNOSIS — M25512 Pain in left shoulder: Secondary | ICD-10-CM | POA: Diagnosis not present

## 2016-08-16 DIAGNOSIS — M6281 Muscle weakness (generalized): Secondary | ICD-10-CM | POA: Diagnosis not present

## 2016-08-18 DIAGNOSIS — M25512 Pain in left shoulder: Secondary | ICD-10-CM | POA: Diagnosis not present

## 2016-08-18 DIAGNOSIS — M545 Low back pain: Secondary | ICD-10-CM | POA: Diagnosis not present

## 2016-08-18 DIAGNOSIS — M6281 Muscle weakness (generalized): Secondary | ICD-10-CM | POA: Diagnosis not present

## 2016-08-18 DIAGNOSIS — M48061 Spinal stenosis, lumbar region without neurogenic claudication: Secondary | ICD-10-CM | POA: Diagnosis not present

## 2016-08-22 DIAGNOSIS — M545 Low back pain: Secondary | ICD-10-CM | POA: Diagnosis not present

## 2016-08-22 DIAGNOSIS — M48061 Spinal stenosis, lumbar region without neurogenic claudication: Secondary | ICD-10-CM | POA: Diagnosis not present

## 2016-08-22 DIAGNOSIS — M25512 Pain in left shoulder: Secondary | ICD-10-CM | POA: Diagnosis not present

## 2016-08-22 DIAGNOSIS — M6281 Muscle weakness (generalized): Secondary | ICD-10-CM | POA: Diagnosis not present

## 2016-08-24 DIAGNOSIS — M25512 Pain in left shoulder: Secondary | ICD-10-CM | POA: Diagnosis not present

## 2016-08-24 DIAGNOSIS — M545 Low back pain: Secondary | ICD-10-CM | POA: Diagnosis not present

## 2016-08-24 DIAGNOSIS — M6281 Muscle weakness (generalized): Secondary | ICD-10-CM | POA: Diagnosis not present

## 2016-08-24 DIAGNOSIS — M48061 Spinal stenosis, lumbar region without neurogenic claudication: Secondary | ICD-10-CM | POA: Diagnosis not present

## 2016-08-25 DIAGNOSIS — M48061 Spinal stenosis, lumbar region without neurogenic claudication: Secondary | ICD-10-CM | POA: Diagnosis not present

## 2016-08-29 ENCOUNTER — Non-Acute Institutional Stay: Payer: Medicare Other | Admitting: Internal Medicine

## 2016-08-29 ENCOUNTER — Encounter: Payer: Self-pay | Admitting: Internal Medicine

## 2016-08-29 VITALS — BP 144/84 | HR 60 | Temp 97.6°F | Ht 61.0 in | Wt 119.0 lb

## 2016-08-29 DIAGNOSIS — M48061 Spinal stenosis, lumbar region without neurogenic claudication: Secondary | ICD-10-CM | POA: Diagnosis not present

## 2016-08-29 DIAGNOSIS — M25551 Pain in right hip: Secondary | ICD-10-CM | POA: Diagnosis not present

## 2016-08-29 DIAGNOSIS — M545 Low back pain: Secondary | ICD-10-CM | POA: Diagnosis not present

## 2016-08-29 DIAGNOSIS — M6281 Muscle weakness (generalized): Secondary | ICD-10-CM | POA: Diagnosis not present

## 2016-08-29 DIAGNOSIS — M25512 Pain in left shoulder: Secondary | ICD-10-CM | POA: Diagnosis not present

## 2016-08-29 DIAGNOSIS — K219 Gastro-esophageal reflux disease without esophagitis: Secondary | ICD-10-CM | POA: Diagnosis not present

## 2016-08-29 NOTE — Progress Notes (Signed)
Facility  [facility]    [name]: Clinic (12)     Allergies  Allergen Reactions  . Latex     To face  . Erythromycin   . Macrodantin [Nitrofurantoin Macrocrystal]   . Penicillins   . Septra [Sulfamethoxazole-Trimethoprim]   . Sulfa Antibiotics   . Trimethoprim     Chief Complaint  Patient presents with  . Medical Management of Chronic Issues    patient wants to talk about stopping Pantoprazol 36m. She stopped it 11 days ago, started Zantac 1555m11 days ago. Also about new shingles vaccine. Had Zoster 09/15/2005.    HPI:  Terrible pain in the right hip. Comes and goes. Usually occurs when she gets up from sitting. Sometimes she has to use a walker. Sees Dr. RaNelva BushTaking physical therapy.  Minimal symptoms of stomach discomfort since switching to ranitidine.  Recommended Shingrix.  Medications: Patient's Medications  New Prescriptions   No medications on file  Previous Medications   BIMATOPROST (LUMIGAN) 0.01 % SOLN    Place 1 drop into both eyes at bedtime.   CALCIUM ELEMENTAL AS CARBONATE (BARIATRIC TUMS ULTRA) 400 MG TABLET    Chew 500 mg by mouth daily.    CHOLECALCIFEROL (VITAMIN D3) 2000 UNITS TABS    Take 2,000 Units/day by mouth 2 (two) times daily.   CLOPIDOGREL (PLAVIX) 75 MG TABLET    TAKE 1 TABLET ONCE A DAY TO PREVENT STROKE.   HYDROCORTISONE CREAM 1 %    Apply 1 application topically as needed for itching.   METOPROLOL SUCCINATE (TOPROL-XL) 25 MG 24 HR TABLET    One daily to regulate heart rate and control BP   MULTIPLE VITAMINS-MINERALS (CENTRUM SILVER ULTRA WOMENS PO)    Take one tablet by mouth once daily for supplement   MULTIPLE VITAMINS-MINERALS (PRESERVISION AREDS 2 PO)    Take by mouth 2 (two) times daily.   POLYETHYL GLYCOL-PROPYL GLYCOL (SYSTANE OP)    Place 1 drop into both eyes 2 (two) times daily. May use up to 3 times if needed.   RANITIDINE (ZANTAC) 150 MG TABLET    Take 150 mg by mouth. Take one tablet daily for stomach  Modified  Medications   No medications on file  Discontinued Medications   PANTOPRAZOLE (PROTONIX) 20 MG TABLET    Take 20 mg by mouth as needed.   PANTOPRAZOLE (PROTONIX) 40 MG TABLET    TAKE 1 TAB DAILY FOR STOMACH ACID     Review of Systems  Constitutional: Positive for fatigue. Negative for activity change, appetite change, chills, diaphoresis, fever and unexpected weight change.  HENT: Negative for congestion, ear discharge, ear pain, hearing loss, postnasal drip, rhinorrhea, sore throat, tinnitus, trouble swallowing and voice change.   Eyes: Negative for pain, redness, itching and visual disturbance.       Left cataract. Dry macular degeneration.  Respiratory: Negative for cough, choking, shortness of breath and wheezing.   Cardiovascular: Positive for leg swelling. Negative for chest pain and palpitations.  Gastrointestinal: Positive for abdominal pain (RUQ lasting about 2-4 hours on 2 occassions.). Negative for abdominal distention, constipation, diarrhea and nausea.       Occasional reflux and heartburn for which she uses Pantoprazole and Tums.  Endocrine: Negative for cold intolerance, heat intolerance, polydipsia, polyphagia and polyuria.  Genitourinary: Negative for difficulty urinating, dysuria, flank pain, frequency, hematuria, pelvic pain, urgency and vaginal discharge.       Stress incontinence.  Musculoskeletal: Positive for myalgias. Negative for arthralgias, back  pain, gait problem, neck pain and neck stiffness.       Some joint pains. Bilateral shoulder discomfort; worse in the right shoulder.  Skin: Negative for color change, pallor and rash.       Recurrent painful corns and calluses on the sole of the left foot.  Neurological: Negative for dizziness, tremors, seizures, syncope, weakness, light-headedness, numbness and headaches.  Hematological: Negative for adenopathy. Does not bruise/bleed easily.  Psychiatric/Behavioral: Negative for agitation, behavioral problems,  confusion, dysphoric mood, hallucinations, sleep disturbance and suicidal ideas. The patient is not nervous/anxious and is not hyperactive.     Vitals:   08/29/16 0952  BP: (!) 144/84  Pulse: 60  Temp: 97.6 F (36.4 C)  TempSrc: Oral  SpO2: 99%  Weight: 119 lb (54 kg)  Height: '5\' 1"'  (1.549 m)   Wt Readings from Last 3 Encounters:  08/29/16 119 lb (54 kg)  06/27/16 119 lb (54 kg)  06/27/16 119 lb 12.8 oz (54.3 kg)    Body mass index is 22.48 kg/m.  Physical Exam  Constitutional: She is oriented to person, place, and time. She appears well-developed and well-nourished. No distress.  HENT:  Head: Normocephalic and atraumatic.  Right Ear: External ear normal.  Left Ear: External ear normal.  Nose: Nose normal.  Mouth/Throat: Oropharynx is clear and moist.  Sinus and head congestion.  Pharnyx is red, but no purulence.  Eyes: Conjunctivae are normal. Pupils are equal, round, and reactive to light. Left eye exhibits no discharge.  Neck: No JVD present. No tracheal deviation present. No thyromegaly present.  Cardiovascular: Normal rate, regular rhythm, normal heart sounds and intact distal pulses.  Exam reveals no gallop and no friction rub.   No murmur heard. Pulmonary/Chest: No respiratory distress. She has no wheezes. She has no rales. She exhibits no tenderness.  Abdominal: She exhibits no distension and no mass. There is no tenderness.  Right inguinal hernia unchanged. Unable to elicit any point tenderness in the abdomen or feel a mass.  Musculoskeletal: She exhibits no edema or tenderness.  Limited ability to raise arms above his shoulders.  Lymphadenopathy:    She has no cervical adenopathy.  Neurological: She is alert and oriented to person, place, and time. She has normal reflexes. No cranial nerve deficit. Coordination normal.  06/27/16 MMSE 28/30. Passed clock drawing.  Skin: No rash noted. No erythema. No pallor.  Psychiatric: She has a normal mood and affect. Her  behavior is normal. Judgment and thought content normal.     Labs reviewed: Lab Summary Latest Ref Rng & Units 06/23/2016 06/18/2015  Hemoglobin 13.0-17.0 g/dL (None) (None)  Hematocrit 39.0-52.0 % (None) (None)  White count - (None) (None)  Platelet count - (None) (None)  Sodium 135 - 146 mmol/L 138 142  Potassium 3.5 - 5.3 mmol/L 4.1 3.9  Calcium 8.6 - 10.4 mg/dL 9.1 9.4  Phosphorus - (None) (None)  Creatinine 0.60 - 0.88 mg/dL 0.68 0.64  AST 10 - 35 U/L 20 21  Alk Phos 33 - 130 U/L 62 70  Bilirubin 0.2 - 1.2 mg/dL 0.4 0.4  Glucose 65 - 99 mg/dL 85 85  Cholesterol <200 mg/dL 197 (None)  HDL cholesterol >50 mg/dL 90 85  Triglycerides <150 mg/dL 87 92  LDL Direct - (None) (None)  LDL Calc <100 mg/dL 90 82  Total protein 6.1 - 8.1 g/dL 6.5 (None)  Albumin 3.6 - 5.1 g/dL 3.8 4.1  Some recent data might be hidden   Lab Results  Component Value Date  TSH 3.87 06/23/2016   Lab Results  Component Value Date   BUN 22 06/23/2016   BUN 18 06/18/2015   BUN 24 06/22/2014   Lab Results  Component Value Date   CREATININE 0.68 06/23/2016   CREATININE 0.64 06/18/2015   CREATININE 0.69 06/22/2014   Lab Results  Component Value Date   HGBA1C 5.3 05/02/2013       Assessment/Plan  1. Gastroesophageal reflux disease without esophagitis Continue ranitidine nightly. OK to stop if she goes several weeks without pain in the abd  2. Right hip pain Continue with Dr. Nelva Bush.  Recommended Shingrix. Wrote prescription for getting it at the pharmacy.

## 2016-08-31 DIAGNOSIS — M25512 Pain in left shoulder: Secondary | ICD-10-CM | POA: Diagnosis not present

## 2016-08-31 DIAGNOSIS — M6281 Muscle weakness (generalized): Secondary | ICD-10-CM | POA: Diagnosis not present

## 2016-08-31 DIAGNOSIS — M545 Low back pain: Secondary | ICD-10-CM | POA: Diagnosis not present

## 2016-08-31 DIAGNOSIS — M48061 Spinal stenosis, lumbar region without neurogenic claudication: Secondary | ICD-10-CM | POA: Diagnosis not present

## 2016-09-05 DIAGNOSIS — M5136 Other intervertebral disc degeneration, lumbar region: Secondary | ICD-10-CM | POA: Diagnosis not present

## 2016-09-05 DIAGNOSIS — M48061 Spinal stenosis, lumbar region without neurogenic claudication: Secondary | ICD-10-CM | POA: Diagnosis not present

## 2016-09-07 DIAGNOSIS — M25512 Pain in left shoulder: Secondary | ICD-10-CM | POA: Diagnosis not present

## 2016-09-07 DIAGNOSIS — M6281 Muscle weakness (generalized): Secondary | ICD-10-CM | POA: Diagnosis not present

## 2016-09-07 DIAGNOSIS — M545 Low back pain: Secondary | ICD-10-CM | POA: Diagnosis not present

## 2016-09-07 DIAGNOSIS — M48061 Spinal stenosis, lumbar region without neurogenic claudication: Secondary | ICD-10-CM | POA: Diagnosis not present

## 2016-09-11 DIAGNOSIS — M6281 Muscle weakness (generalized): Secondary | ICD-10-CM | POA: Diagnosis not present

## 2016-09-11 DIAGNOSIS — M25512 Pain in left shoulder: Secondary | ICD-10-CM | POA: Diagnosis not present

## 2016-09-11 DIAGNOSIS — M545 Low back pain: Secondary | ICD-10-CM | POA: Diagnosis not present

## 2016-09-11 DIAGNOSIS — M48061 Spinal stenosis, lumbar region without neurogenic claudication: Secondary | ICD-10-CM | POA: Diagnosis not present

## 2016-09-14 DIAGNOSIS — M25512 Pain in left shoulder: Secondary | ICD-10-CM | POA: Diagnosis not present

## 2016-09-14 DIAGNOSIS — M6281 Muscle weakness (generalized): Secondary | ICD-10-CM | POA: Diagnosis not present

## 2016-09-14 DIAGNOSIS — M545 Low back pain: Secondary | ICD-10-CM | POA: Diagnosis not present

## 2016-09-18 DIAGNOSIS — M25512 Pain in left shoulder: Secondary | ICD-10-CM | POA: Diagnosis not present

## 2016-09-18 DIAGNOSIS — M545 Low back pain: Secondary | ICD-10-CM | POA: Diagnosis not present

## 2016-09-18 DIAGNOSIS — M6281 Muscle weakness (generalized): Secondary | ICD-10-CM | POA: Diagnosis not present

## 2016-09-20 DIAGNOSIS — M6281 Muscle weakness (generalized): Secondary | ICD-10-CM | POA: Diagnosis not present

## 2016-09-20 DIAGNOSIS — M545 Low back pain: Secondary | ICD-10-CM | POA: Diagnosis not present

## 2016-09-20 DIAGNOSIS — M25512 Pain in left shoulder: Secondary | ICD-10-CM | POA: Diagnosis not present

## 2016-09-25 DIAGNOSIS — M25512 Pain in left shoulder: Secondary | ICD-10-CM | POA: Diagnosis not present

## 2016-09-25 DIAGNOSIS — M545 Low back pain: Secondary | ICD-10-CM | POA: Diagnosis not present

## 2016-09-25 DIAGNOSIS — M6281 Muscle weakness (generalized): Secondary | ICD-10-CM | POA: Diagnosis not present

## 2016-10-03 DIAGNOSIS — M25512 Pain in left shoulder: Secondary | ICD-10-CM | POA: Diagnosis not present

## 2016-10-03 DIAGNOSIS — M6281 Muscle weakness (generalized): Secondary | ICD-10-CM | POA: Diagnosis not present

## 2016-10-03 DIAGNOSIS — M545 Low back pain: Secondary | ICD-10-CM | POA: Diagnosis not present

## 2016-10-05 DIAGNOSIS — M545 Low back pain: Secondary | ICD-10-CM | POA: Diagnosis not present

## 2016-10-05 DIAGNOSIS — M6281 Muscle weakness (generalized): Secondary | ICD-10-CM | POA: Diagnosis not present

## 2016-10-05 DIAGNOSIS — M25512 Pain in left shoulder: Secondary | ICD-10-CM | POA: Diagnosis not present

## 2016-10-10 DIAGNOSIS — M25512 Pain in left shoulder: Secondary | ICD-10-CM | POA: Diagnosis not present

## 2016-10-10 DIAGNOSIS — M545 Low back pain: Secondary | ICD-10-CM | POA: Diagnosis not present

## 2016-10-10 DIAGNOSIS — M6281 Muscle weakness (generalized): Secondary | ICD-10-CM | POA: Diagnosis not present

## 2016-10-12 DIAGNOSIS — M545 Low back pain: Secondary | ICD-10-CM | POA: Diagnosis not present

## 2016-10-12 DIAGNOSIS — M6281 Muscle weakness (generalized): Secondary | ICD-10-CM | POA: Diagnosis not present

## 2016-10-12 DIAGNOSIS — M25512 Pain in left shoulder: Secondary | ICD-10-CM | POA: Diagnosis not present

## 2016-10-16 ENCOUNTER — Encounter: Payer: Self-pay | Admitting: Internal Medicine

## 2016-10-16 DIAGNOSIS — M25512 Pain in left shoulder: Secondary | ICD-10-CM | POA: Diagnosis not present

## 2016-10-16 DIAGNOSIS — M48061 Spinal stenosis, lumbar region without neurogenic claudication: Secondary | ICD-10-CM | POA: Diagnosis not present

## 2016-10-16 DIAGNOSIS — M6281 Muscle weakness (generalized): Secondary | ICD-10-CM | POA: Diagnosis not present

## 2016-10-16 DIAGNOSIS — M545 Low back pain: Secondary | ICD-10-CM | POA: Diagnosis not present

## 2016-10-30 DIAGNOSIS — H04123 Dry eye syndrome of bilateral lacrimal glands: Secondary | ICD-10-CM | POA: Diagnosis not present

## 2016-10-30 DIAGNOSIS — H353132 Nonexudative age-related macular degeneration, bilateral, intermediate dry stage: Secondary | ICD-10-CM | POA: Diagnosis not present

## 2016-10-30 DIAGNOSIS — H401132 Primary open-angle glaucoma, bilateral, moderate stage: Secondary | ICD-10-CM | POA: Diagnosis not present

## 2016-10-31 ENCOUNTER — Encounter: Payer: Self-pay | Admitting: Internal Medicine

## 2016-11-09 NOTE — Addendum Note (Signed)
Addended by: Royann Shivers A on: 11/09/2016 04:07 PM   Modules accepted: Orders

## 2016-11-21 ENCOUNTER — Other Ambulatory Visit: Payer: Self-pay | Admitting: Internal Medicine

## 2016-11-21 DIAGNOSIS — I10 Essential (primary) hypertension: Secondary | ICD-10-CM

## 2016-11-29 ENCOUNTER — Other Ambulatory Visit: Payer: Self-pay | Admitting: *Deleted

## 2016-11-29 DIAGNOSIS — E2839 Other primary ovarian failure: Secondary | ICD-10-CM

## 2016-11-29 DIAGNOSIS — M810 Age-related osteoporosis without current pathological fracture: Secondary | ICD-10-CM

## 2016-11-29 NOTE — Progress Notes (Signed)
Dorothy requested to place order for Bone Density. Stated Dr. Nyoka Cowden ordered wrong. Order placed.

## 2016-12-05 ENCOUNTER — Encounter: Payer: Self-pay | Admitting: Internal Medicine

## 2016-12-05 ENCOUNTER — Ambulatory Visit (INDEPENDENT_AMBULATORY_CARE_PROVIDER_SITE_OTHER): Payer: Medicare Other | Admitting: Internal Medicine

## 2016-12-05 VITALS — BP 118/62 | HR 65 | Temp 97.5°F | Wt 121.2 lb

## 2016-12-05 DIAGNOSIS — M810 Age-related osteoporosis without current pathological fracture: Secondary | ICD-10-CM | POA: Diagnosis not present

## 2016-12-05 DIAGNOSIS — B029 Zoster without complications: Secondary | ICD-10-CM

## 2016-12-05 DIAGNOSIS — T50Z95A Adverse effect of other vaccines and biological substances, initial encounter: Secondary | ICD-10-CM | POA: Diagnosis not present

## 2016-12-05 DIAGNOSIS — M8589 Other specified disorders of bone density and structure, multiple sites: Secondary | ICD-10-CM | POA: Diagnosis not present

## 2016-12-05 MED ORDER — VALACYCLOVIR HCL 1 G PO TABS: 1000.0000 mg | ORAL_TABLET | Freq: Three times a day (TID) | ORAL | 0 refills | Status: DC

## 2016-12-05 NOTE — Progress Notes (Signed)
Patient ID: [name], female   DOB: 06-Sep-1928, 83 y.o.   MRN: [id number]    Location:  [facility] [name]: OFFICE  Chief Complaint  Patient presents with  . Acute Visit    Reaction to Shingles Injection Recieved at [facility] on Saturday. Patient stated she recieved the first shot 5/8 with no reaction but the 2nd injection on Saturday her left arm bruised and she has swelling and redness down arm    HPI:  83 yo female seen today for left arm rash, pain and swelling that began after she had her 2nd shingles vaccine on 12/02/16. She noticed a bruise at the injection site soon after the injection. Several hrs later, pain, swelling and redness began. No f/c but she has increased warmth to touch over red area.  No vesicular formation. No N/V. She has not taken anything OTC for pain. No itching, burning or numbness  Past Medical History:  Diagnosis Date  . Cervicalgia   . Chest pain at rest 01/07/2012  . Displacement of cervical intervertebral disc without myelopathy   . Diverticulosis of colon (without mention of hemorrhage)   . Female stress incontinence   . Inguinal hernia without mention of obstruction or gangrene, unilateral or unspecified, (not specified as recurrent)   . Internal hemorrhoids without mention of complication   . Lumbago   . Macular degeneration (senile) of retina, unspecified   . Osteoarthrosis, unspecified whether generalized or localized, unspecified site   . Other and unspecified hyperlipidemia   . Pain in joint, hand   . Palpitations   . Reflux esophagitis   . Scoliosis (and kyphoscoliosis), idiopathic   . Senile osteoporosis   . Skin disorder   . Thyroid disease   . Unruptured popliteal cyst 06/24/2014   Right knee   . Unspecified essential hypertension   . Unspecified glaucoma(365.9)   . Unspecified hypothyroidism   . Unspecified vitamin D deficiency     Past Surgical History:  Procedure Laterality Date  . ABDOMINAL HYSTERECTOMY  1975   Dr  Mallie Mussel  . APPENDECTOMY  1988  . cardiolite myocardial perfusion study    . DOPPLER ECHOCARDIOGRAPHY    . EYE SURGERY Bilateral 2009   cataract removed right eye, Dr Charise Killian  . FOOT SURGERY  august 2013   Doran Durand, MD  . NM MYOVIEW LTD     negative  . TONSILLECTOMY  1937  . TOOTH EXTRACTION  09/16/13   Dr Carlos American    Patient Care Team: Gayland Curry, DO as PCP - General (Geriatric Medicine) Laurence Spates, MD as Consulting Physician (Gastroenterology) Myrlene Broker, MD as Attending Physician (Urology) Lorretta Harp, MD as Consulting Physician (Cardiology) Rozetta Nunnery, MD as Consulting Physician (Otolaryngology) Richarda Osmond, DDS (Dentistry) Shon Hough, MD as Consulting Physician (Ophthalmology) Iran Planas, MD as Consulting Physician (Orthopedic Surgery)  Social History   Social History  . Marital status: Widowed    Spouse name: N/A  . Number of children: 2  . Years of education: college   Occupational History  .      retired   Social History Main Topics  . Smoking status: Never Smoker  . Smokeless tobacco: Never Used  . Alcohol use No  . Drug use: No  . Sexual activity: No   Other Topics Concern  . Not on file   Social History Narrative   Patient lives at home with her daughter [name]) , moved to [facility] 02/02/16 [place]  Widowed.   Retired.   EducationNurse, mental health.   Right handed.   Caffeine- None some times tea very rare.   Never smoked   Alcohol none     reports that she has never smoked. She has never used smokeless tobacco. She reports that she does not drink alcohol or use drugs.  Family History  Problem Relation Age of Onset  . CAD Mother   . Parkinson's disease Mother   . Cancer Father        colon cancer  . Parkinson's disease Father   . Stroke Maternal Grandmother    Family Status  Relation Status  . Mother Deceased at age 71  . Father Deceased at age 50  . Brother Alive  . Daughter Alive  . Son  Alive  . MGM (Not Specified)     Allergies  Allergen Reactions  . Latex     To face  . Erythromycin   . Macrodantin [Nitrofurantoin Macrocrystal]   . Penicillins   . Septra [Sulfamethoxazole-Trimethoprim]   . Sulfa Antibiotics   . Trimethoprim     Medications: Patient's Medications  New Prescriptions   No medications on file  Previous Medications   BIMATOPROST (LUMIGAN) 0.01 % SOLN    Place 1 drop into both eyes at bedtime.   CALCIUM ELEMENTAL AS CARBONATE (BARIATRIC TUMS ULTRA) 400 MG TABLET    Chew 500 mg by mouth daily.    CHOLECALCIFEROL (VITAMIN D3) 2000 UNITS TABS    Take 2,000 Units/day by mouth 2 (two) times daily.   CLOPIDOGREL (PLAVIX) 75 MG TABLET    TAKE 1 TABLET ONCE A DAY TO PREVENT STROKE.   HYDROCORTISONE CREAM 1 %    Apply 1 application topically as needed for itching.   METOPROLOL SUCCINATE (TOPROL-XL) 25 MG 24 HR TABLET    TAKE 1 TABLET ONCE TO REGULATE HEART AND CONTROL BLOOD PRESSURE.   MULTIPLE VITAMINS-MINERALS (CENTRUM SILVER ULTRA WOMENS PO)    Take one tablet by mouth once daily for supplement   MULTIPLE VITAMINS-MINERALS (PRESERVISION AREDS 2 PO)    Take by mouth 2 (two) times daily.   POLYETHYL GLYCOL-PROPYL GLYCOL (SYSTANE OP)    Place 1 drop into both eyes 2 (two) times daily. May use up to 3 times if needed.   RANITIDINE (ZANTAC) 150 MG TABLET    Take 150 mg by mouth. Take one tablet daily for stomach  Modified Medications   No medications on file  Discontinued Medications   No medications on file    Review of Systems  Musculoskeletal: Positive for arthralgias.  Skin: Positive for color change and rash.  All other systems reviewed and are negative.   Vitals:   12/05/16 0905  BP: 118/62  Pulse: 65  Temp: (!) 97.5 F (36.4 C)  TempSrc: Oral  SpO2: 98%  Weight: 121 lb 3.2 oz (55 kg)   Body mass index is 22.9 kg/m.  Physical Exam  Constitutional: She is oriented to person, place, and time. She appears well-developed and  well-nourished.  Musculoskeletal: She exhibits edema.  Neurological: She is alert and oriented to person, place, and time.  Skin: Skin is warm and dry. Rash noted. There is erythema.     Psychiatric: She has a normal mood and affect. Her behavior is normal. Judgment and thought content normal.     Labs reviewed: No visits with results within 3 Month(s) from this visit.  Latest known visit with results is:  Appointment on 06/23/2016  Component Date Value Ref Range  Status  . TSH 06/23/2016 3.87  mIU/L Final   Comment:   Reference Range   > or = 20 Years  0.40-4.50   Pregnancy Range First trimester  0.26-2.66 Second trimester 0.55-2.73 Third trimester  0.43-2.91     . Cholesterol 06/23/2016 197  <200 mg/dL Final  . Triglycerides 06/23/2016 87  <150 mg/dL Final  . HDL 06/23/2016 90  >50 mg/dL Final  . Total CHOL/HDL Ratio 06/23/2016 2.2  <5.0 Ratio Final  . VLDL 06/23/2016 17  <30 mg/dL Final  . LDL Cholesterol 06/23/2016 90  <100 mg/dL Final  . Sodium 06/23/2016 138  135 - 146 mmol/L Final  . Potassium 06/23/2016 4.1  3.5 - 5.3 mmol/L Final  . Chloride 06/23/2016 103  98 - 110 mmol/L Final  . CO2 06/23/2016 25  20 - 31 mmol/L Final  . Glucose, Bld 06/23/2016 85  65 - 99 mg/dL Final  . BUN 06/23/2016 22  7 - 25 mg/dL Final  . Creat 06/23/2016 0.68  0.60 - 0.88 mg/dL Final   Comment:   For patients > or = 81 years of age: The upper reference limit for Creatinine is approximately 13% higher for people identified as African-American.     . Total Bilirubin 06/23/2016 0.4  0.2 - 1.2 mg/dL Final  . Alkaline Phosphatase 06/23/2016 62  33 - 130 U/L Final  . AST 06/23/2016 20  10 - 35 U/L Final  . ALT 06/23/2016 16  6 - 29 U/L Final  . Total Protein 06/23/2016 6.5  6.1 - 8.1 g/dL Final  . Albumin 06/23/2016 3.8  3.6 - 5.1 g/dL Final  . Calcium 06/23/2016 9.1  8.6 - 10.4 mg/dL Final    No results found.   Assessment/Plan   ICD-10-CM   1. Herpes zoster without  complication K81.2 valACYclovir (VALTREX) 1000 MG tablet  2. Adverse effect of vaccine, initial encounter T50.Z95A    due to shingrix injection #2   START VALTREX 1 gm 3 times daily x 7 days for shingles   May apply cool compresses as needed to painful areas  Continue other meds as ordered  Wash hands frequently  Follow up as scheduled with Dr Mariea Clonts or sooner if rash does not resolve or worsens  Raymie Giammarco S. Perlie Gold  [facility] [address] [place], [place] [address] [phone] Cell (Monday-Friday 8 AM - 5 PM) [phone] After 5 PM and follow prompts

## 2016-12-07 NOTE — Patient Instructions (Addendum)
START VALTREX 1 gm 3 times daily x 7 days for shingles   May apply cool compresses as needed to painful areas  Continue other meds as ordered  Wash hands frequently  Follow up as scheduled with Dr Mariea Clonts or sooner if rash does not resolve or worsens

## 2016-12-11 ENCOUNTER — Telehealth: Payer: Self-pay | Admitting: *Deleted

## 2016-12-11 NOTE — Telephone Encounter (Signed)
.  left message to have patient return my call.  "Osteopenia, she should be on calcium 600mg  w/vit D400mg  ( like caltrate with D) and additional 2000 units of Vitamin D daily and do weightbearing exercise like walking and balance exercises."

## 2016-12-12 NOTE — Telephone Encounter (Signed)
Discussed results with patient, patient verbalized understanding of results   Patient already taking Vit D3 2000 units 1 by mouth twice daily for a total of 4000 units daily , patient takes tums 1/2 tablet daily for a total of 500 mg of calcium daily, patient will consider switching to Caltrate.

## 2016-12-22 ENCOUNTER — Other Ambulatory Visit: Payer: Medicare Other

## 2016-12-26 ENCOUNTER — Other Ambulatory Visit: Payer: Medicare Other

## 2016-12-28 ENCOUNTER — Ambulatory Visit: Payer: Medicare Other | Admitting: Internal Medicine

## 2016-12-29 ENCOUNTER — Ambulatory Visit: Payer: Medicare Other | Admitting: Internal Medicine

## 2017-01-18 DIAGNOSIS — H353132 Nonexudative age-related macular degeneration, bilateral, intermediate dry stage: Secondary | ICD-10-CM | POA: Diagnosis not present

## 2017-01-18 DIAGNOSIS — H5310 Unspecified subjective visual disturbances: Secondary | ICD-10-CM | POA: Diagnosis not present

## 2017-01-18 DIAGNOSIS — Z961 Presence of intraocular lens: Secondary | ICD-10-CM | POA: Diagnosis not present

## 2017-01-24 DIAGNOSIS — M25471 Effusion, right ankle: Secondary | ICD-10-CM | POA: Diagnosis not present

## 2017-01-24 DIAGNOSIS — M19071 Primary osteoarthritis, right ankle and foot: Secondary | ICD-10-CM | POA: Diagnosis not present

## 2017-01-30 ENCOUNTER — Other Ambulatory Visit: Payer: Medicare Other

## 2017-01-30 ENCOUNTER — Other Ambulatory Visit: Payer: Self-pay

## 2017-01-30 DIAGNOSIS — I10 Essential (primary) hypertension: Secondary | ICD-10-CM

## 2017-01-30 LAB — BASIC METABOLIC PANEL
BUN: 25 mg/dL (ref 7–25)
CO2: 27 mmol/L (ref 20–32)
Calcium: 9.2 mg/dL (ref 8.6–10.4)
Chloride: 101 mmol/L (ref 98–110)
Creat: 0.73 mg/dL (ref 0.60–0.88)
Glucose, Bld: 78 mg/dL (ref 65–99)
Potassium: 4.1 mmol/L (ref 3.5–5.3)
Sodium: 137 mmol/L (ref 135–146)

## 2017-01-30 LAB — EXTRA LAV TOP TUBE

## 2017-02-01 ENCOUNTER — Ambulatory Visit (INDEPENDENT_AMBULATORY_CARE_PROVIDER_SITE_OTHER): Payer: Medicare Other | Admitting: Internal Medicine

## 2017-02-01 ENCOUNTER — Encounter: Payer: Self-pay | Admitting: Internal Medicine

## 2017-02-01 VITALS — BP 118/68 | HR 60 | Temp 97.1°F | Wt 123.0 lb

## 2017-02-01 DIAGNOSIS — N393 Stress incontinence (female) (male): Secondary | ICD-10-CM

## 2017-02-01 DIAGNOSIS — H3530 Unspecified macular degeneration: Secondary | ICD-10-CM

## 2017-02-01 DIAGNOSIS — G8929 Other chronic pain: Secondary | ICD-10-CM

## 2017-02-01 DIAGNOSIS — G459 Transient cerebral ischemic attack, unspecified: Secondary | ICD-10-CM | POA: Diagnosis not present

## 2017-02-01 DIAGNOSIS — Z23 Encounter for immunization: Secondary | ICD-10-CM

## 2017-02-01 DIAGNOSIS — K219 Gastro-esophageal reflux disease without esophagitis: Secondary | ICD-10-CM | POA: Diagnosis not present

## 2017-02-01 DIAGNOSIS — M25551 Pain in right hip: Secondary | ICD-10-CM

## 2017-02-01 DIAGNOSIS — E7800 Pure hypercholesterolemia, unspecified: Secondary | ICD-10-CM

## 2017-02-01 DIAGNOSIS — I671 Cerebral aneurysm, nonruptured: Secondary | ICD-10-CM

## 2017-02-01 DIAGNOSIS — K4090 Unilateral inguinal hernia, without obstruction or gangrene, not specified as recurrent: Secondary | ICD-10-CM

## 2017-02-01 DIAGNOSIS — I10 Essential (primary) hypertension: Secondary | ICD-10-CM

## 2017-02-01 DIAGNOSIS — E039 Hypothyroidism, unspecified: Secondary | ICD-10-CM | POA: Diagnosis not present

## 2017-02-01 DIAGNOSIS — R609 Edema, unspecified: Secondary | ICD-10-CM | POA: Diagnosis not present

## 2017-02-01 DIAGNOSIS — Z7189 Other specified counseling: Secondary | ICD-10-CM | POA: Insufficient documentation

## 2017-02-01 DIAGNOSIS — M25511 Pain in right shoulder: Secondary | ICD-10-CM

## 2017-02-01 NOTE — Progress Notes (Signed)
Location:  [facility] clinic Provider:  Laticia Vannostrand L. Mariea Clonts, D.O., C.M.D.  Code Status: DNR, given MOST to think about some more and I will sign with her She is clear she does not want to be brought back if her heart stops or she stops breathing.  She has a living will with this indicated, as well.   Goals of Care:  Advanced Directives 02/01/2017  Does Patient Have a Medical Advance Directive? Yes  Type of Advance Directive Living will  Does patient want to make changes to medical advance directive? -  Copy of [place] in Chart? -   Chief Complaint  Patient presents with  . Medical Management of Chronic Issues    85mth follow-up    HPI: Patient is a 83 y.o. female seen today for medical management of chronic diseases and transferring to me after Dr. Rolly Salter retirement.  She is doing well.    GERD:  She has come off pantoprazole and on zantac daily, but might try tapering to as needed.    She fell 2/27 but it was not her fault.  She was tending something in her friend's house.  When they turned into her living room, there was a step and she didn't know about ti.  She fell on her right hip.  She sat there a bit and got helped up.  Started to hurt in right hip and down right posterolateral thigh.  She saw Dr. Nelva Bush' PA, Arbie Cookey, who ordered PT in April and May.  That was done 6/4.  It really helped.  Once in a while, she took a tylenol, but it didn't do much good.    She has started doing exercises after her bone density test 7/24.  Doing the stretching class, some aquatics, and other exercise classes, too.  Dee called and advised that she had osteopenia on the bone density.  She is taking 500mg  total of calcium b/w tums and her multivitamin. She is taking 4000 units of vitamin d total.    She had her second shingles shot 7/21. Dr. Eulas Post saw her arm and prescribed valacyclovir--it was red, but no blisters, but was bumpy in the area.  Took the valtrex for a while, but it upset her  stomach.  She stopped it and that went away.  She also had a lymph node on her left neck after the vaccine, too.  It went away in 2 days.    8/1, she was bending over after opening the freezer and then opened the fridge door, she hit her head pretty hard with the freezer door.  Bump on the left side of her head is now better.    Had a beach trip midaugust with family.  She ate a lot of shrimp.  Watches her sodium quite a bit as best she can.  Her legs were swollen for 2 wks upon returning home, but then improved again.  8/30, she was sitting at her desk and at 2pm and 5pm, she had a ripple feeling.  Happened again 8/31 that was more of a straight pain in the left side of her head.  She has a stroke recognition chart she used and she passed the test. She says she may have had a TIA in 2013.  When she saw the neurologist, Dr. Krista Blue, she showed her her imaging.  There was one place that might have been a new blood vessel or TIA. The brain aneurysm was noted at that time.  9/12, she had  a similar feeling again, but passed her test again.    Saw Dr. Kathrin Penner 9/6 after her vision was looking blurry on the upper part. She got a good report--no difference noted by the doctor.  She did refer her to a retina specialist which will be in January.  Right foot was hurting on the top in the middle.  Went to Dr. Nona Dell PA [facility] and arthritis as seen in the foot, but no fracture.   He recommended golden raisins soaked in gin.  She has not tried this though she says it is recommended on [facility].  A special gin is recommended on there.    Hyperlipidemia:  Controlled now. LDL only 90.    Hypothyroidism:   Lab Results  Component Value Date   TSH 3.87 06/23/2016    Right shoulder pain--both shoulders actually ache at times.  Inguinal hernia is on the right side. Knows to go to the ED if it's not reducible.  Does have some urinary stress incontinence which is getting a little worse.  Able to do ok  for 1-1.5 hrs w/o a problem.  Pours out if she sits a while and stands up.  Has to use a #3 pad.  It's aggravating.  Her daughter needs a doctor.  She was going to the [facility] urgent care.    Got her flu shot today.  Past Medical History:  Diagnosis Date  . Cervicalgia   . Chest pain at rest 01/07/2012  . Displacement of cervical intervertebral disc without myelopathy   . Diverticulosis of colon (without mention of hemorrhage)   . Female stress incontinence   . Inguinal hernia without mention of obstruction or gangrene, unilateral or unspecified, (not specified as recurrent)   . Internal hemorrhoids without mention of complication   . Lumbago   . Macular degeneration (senile) of retina, unspecified   . Osteoarthrosis, unspecified whether generalized or localized, unspecified site   . Other and unspecified hyperlipidemia   . Pain in joint, hand   . Palpitations   . Reflux esophagitis   . Scoliosis (and kyphoscoliosis), idiopathic   . Senile osteoporosis   . Skin disorder   . Thyroid disease   . Unruptured popliteal cyst 06/24/2014   Right knee   . Unspecified essential hypertension   . Unspecified glaucoma(365.9)   . Unspecified hypothyroidism   . Unspecified vitamin D deficiency     Past Surgical History:  Procedure Laterality Date  . ABDOMINAL HYSTERECTOMY  1975   Dr Mallie Mussel  . APPENDECTOMY  1988  . cardiolite myocardial perfusion study    . DOPPLER ECHOCARDIOGRAPHY    . EYE SURGERY Bilateral 2009   cataract removed right eye, Dr Charise Killian  . FOOT SURGERY  august 2013   Doran Durand, MD  . NM MYOVIEW LTD     negative  . TONSILLECTOMY  1937  . TOOTH EXTRACTION  09/16/13   Dr Carlos American    Allergies  Allergen Reactions  . Latex     To face  . Erythromycin   . Macrodantin [Nitrofurantoin Macrocrystal]   . Penicillins   . Septra [Sulfamethoxazole-Trimethoprim]   . Sulfa Antibiotics   . Trimethoprim     Outpatient Encounter Prescriptions as of 02/01/2017  Medication Sig  .  bimatoprost (LUMIGAN) 0.01 % SOLN Place 1 drop into both eyes at bedtime.  . calcium elemental as carbonate (BARIATRIC TUMS ULTRA) 400 MG tablet Chew 500 mg by mouth daily.   . Cholecalciferol (VITAMIN D3) 2000 UNITS TABS Take 2,000 Units/day  by mouth 2 (two) times daily.  . clopidogrel (PLAVIX) 75 MG tablet TAKE 1 TABLET ONCE A DAY TO PREVENT STROKE.  . hydrocortisone cream 1 % Apply 1 application topically as needed for itching.  . metoprolol succinate (TOPROL-XL) 25 MG 24 hr tablet TAKE 1 TABLET ONCE TO REGULATE HEART AND CONTROL BLOOD PRESSURE.  . Multiple Vitamins-Minerals (CENTRUM SILVER ULTRA WOMENS PO) Take one tablet by mouth once daily for supplement  . Multiple Vitamins-Minerals (PRESERVISION AREDS 2 PO) Take by mouth 2 (two) times daily.  Vladimir Faster Glycol-Propyl Glycol (SYSTANE OP) Place 1 drop into both eyes 2 (two) times daily. May use up to 3 times if needed.  . ranitidine (ZANTAC) 150 MG tablet Take 150 mg by mouth. Take one tablet daily for stomach  . [DISCONTINUED] valACYclovir (VALTREX) 1000 MG tablet Take 1 tablet (1,000 mg total) by mouth 3 (three) times daily.   No facility-administered encounter medications on file as of 02/01/2017.     Review of Systems:  Review of Systems  Constitutional: Negative for chills, fever and malaise/fatigue.  HENT: Positive for hearing loss. Negative for congestion.   Eyes: Negative for blurred vision.  Respiratory: Negative for cough and shortness of breath.   Cardiovascular: Negative for chest pain, palpitations and leg swelling.  Gastrointestinal: Positive for heartburn. Negative for abdominal pain, blood in stool, constipation and melena.  Genitourinary: Negative for dysuria.       Stress incontinence  Musculoskeletal: Positive for falls.  Skin: Negative for rash.  Neurological: Negative for dizziness, loss of consciousness and weakness.  Endo/Heme/Allergies: Bruises/bleeds easily.  Psychiatric/Behavioral: Negative for depression  and memory loss. The patient is not nervous/anxious and does not have insomnia.     Health Maintenance  Topic Date Due  . INFLUENZA VACCINE  12/13/2016  . MAMMOGRAM  04/28/2017  . TETANUS/TDAP  03/20/2020  . DEXA SCAN  Completed  . PNA vac Low Risk Adult  Completed    Physical Exam: Vitals:   02/01/17 1029  BP: 118/68  Pulse: 60  Temp: (!) 97.1 F (36.2 C)  TempSrc: Oral  SpO2: 98%  Weight: 123 lb (55.8 kg)   Body mass index is 23.24 kg/m. Physical Exam  Constitutional: She is oriented to person, place, and time. She appears well-developed and well-nourished. No distress.  Eyes:  glasses  Cardiovascular: Normal rate, regular rhythm, normal heart sounds and intact distal pulses.   Pulmonary/Chest: Effort normal and breath sounds normal. No respiratory distress. She has no wheezes. She has no rales.  Abdominal: Bowel sounds are normal.  Musculoskeletal: Normal range of motion.  Neurological: She is alert and oriented to person, place, and time. No cranial nerve deficit.  Skin: Skin is warm and dry. Capillary refill takes less than 2 seconds.  Psychiatric: She has a normal mood and affect.    Labs reviewed: Basic Metabolic Panel:  Recent Labs  06/23/16 0001 01/30/17 0827  NA 138 137  K 4.1 4.1  CL 103 101  CO2 25 27  GLUCOSE 85 78  BUN 22 25  CREATININE 0.68 0.73  CALCIUM 9.1 9.2  TSH 3.87  --    Liver Function Tests:  Recent Labs  06/23/16 0001  AST 20  ALT 16  ALKPHOS 62  BILITOT 0.4  PROT 6.5  ALBUMIN 3.8   No results for input(s): LIPASE, AMYLASE in the last 8760 hours. No results for input(s): AMMONIA in the last 8760 hours. CBC: No results for input(s): WBC, NEUTROABS, HGB, HCT, MCV, PLT in the  last 8760 hours. Lipid Panel:  Recent Labs  06/23/16 0001  CHOL 197  HDL 90  LDLCALC 90  TRIG 87  CHOLHDL 2.2   Lab Results  Component Value Date   HGBA1C 5.3 05/02/2013    Assessment/Plan 1. Need for immunization against influenza -  Flu vaccine HIGH DOSE PF (Fluzone High dose) given  2. Gastroesophageal reflux disease without esophagitis -using zantac and off omeprazole  3. Brain aneurysm -previously noted when she had visual changes and was felt to have a TIA, saw Dr. Krista Blue  4. Transient cerebral ischemia, unspecified type -in the past, cont risk factor mgt--she is ware of worrisome signs and knows when to proceed to the ED  5. Essential hypertension -bp at goal with current therapy, cont same regimen  6. Hypothyroidism, unspecified type -TSH at goal with current levothyroxine  7. Edema, unspecified type -of ankles due to venous insufficiency  8. Pure hypercholesterolemia -not on meds, cont to monitor, pt 86  9. Macular degeneration of both eyes, unspecified type -cont vitamins and following with opthho  10. Unilateral inguinal hernia without obstruction or gangrene, recurrence not specified -stable, cont to monitor it, knows when this is an emergency  11. Female stress incontinence -ongoing, cont use of pad, has tried kegels in the past  12. Right hip pain -after fall, has gotten a lot better, cont tylenol if really bad, therapy helped  13. Chronic right shoulder pain -ongoing due to OA, cont prn tylenol, topicals  14. Advance care planning -spent 25 mins discussing code status and importance of figuring this out, has living will and hcpoa  Labs/tests ordered:  No new Next appt:  4 mos med mgt  Erasto Sleight L. Corian Handley, D.O. [facility] [address], [place] [address] Cell Phone (Mon-Fri 8am-5pm):  [phone] On Call:  [phone] & follow prompts after 5pm & weekends Office Phone:  [phone] Office Fax:  [phone]

## 2017-02-21 ENCOUNTER — Other Ambulatory Visit: Payer: Self-pay | Admitting: Internal Medicine

## 2017-02-21 DIAGNOSIS — I10 Essential (primary) hypertension: Secondary | ICD-10-CM

## 2017-03-27 DIAGNOSIS — M25531 Pain in right wrist: Secondary | ICD-10-CM | POA: Diagnosis not present

## 2017-03-27 DIAGNOSIS — S40011A Contusion of right shoulder, initial encounter: Secondary | ICD-10-CM | POA: Diagnosis not present

## 2017-04-02 DIAGNOSIS — H353132 Nonexudative age-related macular degeneration, bilateral, intermediate dry stage: Secondary | ICD-10-CM | POA: Diagnosis not present

## 2017-04-02 DIAGNOSIS — Z961 Presence of intraocular lens: Secondary | ICD-10-CM | POA: Diagnosis not present

## 2017-04-02 DIAGNOSIS — H04123 Dry eye syndrome of bilateral lacrimal glands: Secondary | ICD-10-CM | POA: Diagnosis not present

## 2017-04-02 DIAGNOSIS — H401232 Low-tension glaucoma, bilateral, moderate stage: Secondary | ICD-10-CM | POA: Diagnosis not present

## 2017-04-17 ENCOUNTER — Encounter: Payer: Self-pay | Admitting: Cardiovascular Disease

## 2017-04-17 ENCOUNTER — Ambulatory Visit (INDEPENDENT_AMBULATORY_CARE_PROVIDER_SITE_OTHER): Payer: Medicare Other | Admitting: Cardiovascular Disease

## 2017-04-17 VITALS — BP 132/70 | HR 61 | Ht 62.0 in | Wt 124.0 lb

## 2017-04-17 DIAGNOSIS — E7800 Pure hypercholesterolemia, unspecified: Secondary | ICD-10-CM

## 2017-04-17 DIAGNOSIS — I10 Essential (primary) hypertension: Secondary | ICD-10-CM

## 2017-04-17 NOTE — Progress Notes (Signed)
04/17/2017 [place]   June 04, 1928  [id number]  Primary Physician Gayland Curry, DO Primary Cardiologist: Lorretta Harp MD FACP, [place], [place], [place]  HPI:  [name] is a 83 y.o.  thin appearing widowed Caucasian female mother of 2, grandmother of 2 grandchildren who I last saw in the office 04/14/16.Marland Kitchen Her only symptoms at that time were palpitations on a low-dose beta blocker. She also had a history of GERD. She was admitted to [facility] on January 06, 2013 with chest pain. She ruled out for myocardial infarction. She had a Myoview stress test which was normal. She had no recurrent symptoms.she also had TIA type symptoms last year and was diagnosed with a small "cerebral aneurysm "conservative medical therapy was recommended. She follows up with a neurologist and has been asymptomatic. She does have left shoulder issues and apparently since seen Dr. Onnie Graham was recommended total shoulder replacement. Since I saw her a year ago she is remaining clinically stable. She denies chest pain or shortness of breath. She was in [place]  a year ago at her granddaughter's wedding and wishes to return.      Current Meds  Medication Sig  . bimatoprost (LUMIGAN) 0.01 % SOLN Place 1 drop into both eyes at bedtime.  . calcium elemental as carbonate (BARIATRIC TUMS ULTRA) 400 MG tablet Chew 500 mg by mouth daily.   . Cholecalciferol (VITAMIN D3) 2000 UNITS TABS Take 2,000 Units/day by mouth 2 (two) times daily.  . clopidogrel (PLAVIX) 75 MG tablet TAKE 1 TABLET ONCE A DAY TO PREVENT STROKE.  . hydrocortisone cream 1 % Apply 1 application topically as needed for itching.  . metoprolol succinate (TOPROL-XL) 25 MG 24 hr tablet TAKE 1 TABLET ONCE TO REGULATE HEART AND CONTROL BLOOD PRESSURE.  . Multiple Vitamins-Minerals (CENTRUM SILVER ULTRA WOMENS PO) Take one tablet by mouth once daily for supplement  . Multiple Vitamins-Minerals (PRESERVISION AREDS 2 PO) Take by mouth 2 (two) times  daily.  Vladimir Faster Glycol-Propyl Glycol (SYSTANE OP) Place 1 drop into both eyes 2 (two) times daily. May use up to 3 times if needed.  . ranitidine (ZANTAC) 150 MG tablet Take 150 mg by mouth daily as needed. Take one tablet daily for stomach      Allergies  Allergen Reactions  . Latex     To face  . Erythromycin   . Macrodantin [Nitrofurantoin Macrocrystal]   . Penicillins   . Septra [Sulfamethoxazole-Trimethoprim]   . Sulfa Antibiotics   . Trimethoprim     Social History   Socioeconomic History  . Marital status: Widowed    Spouse name: Not on file  . Number of children: 2  . Years of education: college  . Highest education level: Not on file  Social Needs  . Financial resource strain: Not on file  . Food insecurity - worry: Not on file  . Food insecurity - inability: Not on file  . Transportation needs - medical: Not on file  . Transportation needs - non-medical: Not on file  Occupational History    Comment: retired  Tobacco Use  . Smoking status: Never Smoker  . Smokeless tobacco: Never Used  Substance and Sexual Activity  . Alcohol use: No  . Drug use: No  . Sexual activity: No  Other Topics Concern  . Not on file  Social History Narrative   Patient lives at home with her daughter [name]) , moved to [facility] 02/02/16 [place]  Widowed.   Retired.   EducationNurse, mental health.   Right handed.   Caffeine- None some times tea very rare.   Never smoked   Alcohol none     Review of Systems: General: negative for chills, fever, night sweats or weight changes.  Cardiovascular: negative for chest pain, dyspnea on exertion, edema, orthopnea, palpitations, paroxysmal nocturnal dyspnea or shortness of breath Dermatological: negative for rash Respiratory: negative for cough or wheezing Urologic: negative for hematuria Abdominal: negative for nausea, vomiting, diarrhea, bright red blood per rectum, melena, or hematemesis Neurologic: negative for visual changes,  syncope, or dizziness All other systems reviewed and are otherwise negative except as noted above.    Blood pressure 132/70, pulse 61, height 5\' 2"  (1.575 m), weight 124 lb (56.2 kg).  General appearance: alert and no distress Neck: no adenopathy, no carotid bruit, no JVD, supple, symmetrical, trachea midline and thyroid not enlarged, symmetric, no tenderness/mass/nodules Lungs: clear to auscultation bilaterally Heart: regular rate and rhythm, S1, S2 normal, no murmur, click, rub or gallop Extremities: extremities normal, atraumatic, no cyanosis or edema Pulses: 2+ and symmetric Skin: Skin color, texture, turgor normal. No rashes or lesions Neurologic: Alert and oriented X 3, normal strength and tone. Normal symmetric reflexes. Normal coordination and gait  EKG sinus rhythm at 61 without ST or T-wave changes.I  Personally reviewed this EKG  ASSESSMENT AND PLAN:   Essential hypertension History of essential hypertension blood pressure measured 132/70. She is on metoprolol. Continue current meds at current dosing.  Hyperlipidemia History of hyperlipidemia not on statin therapy with recent lipid profile performed 06/23/16 revealed total cholesterol 197, LDL of 90 and HDL of 90.      Lorretta Harp MD FACP,FACC,FAHA, [facility] 04/17/2017 2:56 PM

## 2017-04-17 NOTE — Assessment & Plan Note (Signed)
History of hyperlipidemia not on statin therapy with recent lipid profile performed 06/23/16 revealed total cholesterol 197, LDL of 90 and HDL of 90.

## 2017-04-17 NOTE — Patient Instructions (Signed)

## 2017-04-17 NOTE — Assessment & Plan Note (Signed)
History of essential hypertension blood pressure measured 132/70. She is on metoprolol. Continue current meds at current dosing.

## 2017-04-30 DIAGNOSIS — Z1231 Encounter for screening mammogram for malignant neoplasm of breast: Secondary | ICD-10-CM | POA: Diagnosis not present

## 2017-04-30 LAB — HM MAMMOGRAPHY

## 2017-05-02 ENCOUNTER — Encounter: Payer: Self-pay | Admitting: *Deleted

## 2017-05-03 DIAGNOSIS — N390 Urinary tract infection, site not specified: Secondary | ICD-10-CM | POA: Diagnosis not present

## 2017-05-23 DIAGNOSIS — H43813 Vitreous degeneration, bilateral: Secondary | ICD-10-CM | POA: Diagnosis not present

## 2017-05-23 DIAGNOSIS — H353132 Nonexudative age-related macular degeneration, bilateral, intermediate dry stage: Secondary | ICD-10-CM | POA: Diagnosis not present

## 2017-05-25 ENCOUNTER — Other Ambulatory Visit: Payer: Self-pay | Admitting: Internal Medicine

## 2017-05-25 DIAGNOSIS — I10 Essential (primary) hypertension: Secondary | ICD-10-CM

## 2017-06-07 ENCOUNTER — Ambulatory Visit (INDEPENDENT_AMBULATORY_CARE_PROVIDER_SITE_OTHER): Payer: Medicare Other | Admitting: Internal Medicine

## 2017-06-07 ENCOUNTER — Encounter: Payer: Self-pay | Admitting: Internal Medicine

## 2017-06-07 VITALS — BP 128/80 | HR 60 | Temp 97.6°F | Wt 124.0 lb

## 2017-06-07 DIAGNOSIS — N393 Stress incontinence (female) (male): Secondary | ICD-10-CM | POA: Diagnosis not present

## 2017-06-07 DIAGNOSIS — M25551 Pain in right hip: Secondary | ICD-10-CM

## 2017-06-07 DIAGNOSIS — E7800 Pure hypercholesterolemia, unspecified: Secondary | ICD-10-CM

## 2017-06-07 DIAGNOSIS — K219 Gastro-esophageal reflux disease without esophagitis: Secondary | ICD-10-CM

## 2017-06-07 DIAGNOSIS — Z7189 Other specified counseling: Secondary | ICD-10-CM | POA: Diagnosis not present

## 2017-06-07 DIAGNOSIS — M8580 Other specified disorders of bone density and structure, unspecified site: Secondary | ICD-10-CM | POA: Diagnosis not present

## 2017-06-07 DIAGNOSIS — W07XXXA Fall from chair, initial encounter: Secondary | ICD-10-CM

## 2017-06-07 DIAGNOSIS — E559 Vitamin D deficiency, unspecified: Secondary | ICD-10-CM | POA: Diagnosis not present

## 2017-06-07 NOTE — ACP (Advance Care Planning) (Signed)
Spoke at length about the MOST form and we will waiting to hear back from her after she has spoke with daughter and son. Encouraged to take her time and make the choices that she desires would be best for her to have. At this point, she has selected DNR, does want fluids and antibiotics and is leaning toward limited additional interventions, no tube feeding.    She already has the DNR form.  She also has a living will on file.    She was here alone today.    Also discussed benefits of being at [facility] allowing for her to potentially receive care in the rehab unit rather than the hospital setting for most infections as long as she is medically stable.    16 mins were spent discussing the MOST form some more and reviewing potential scenarios she may encounter.

## 2017-06-07 NOTE — Patient Instructions (Addendum)
Speak with daughter and son about MOST form and finalize any details you would like to express.   Drink more water and continue a well balanced diet with increase protein intake. Can use Ensure if need to help with protein.   Continue to use cane as needed, and walk as often as you can to keep up strength and mobility.

## 2017-06-07 NOTE — Progress Notes (Signed)
Location:  [facility] clinic Provider:  Tiffany L. Mariea Clonts, D.O., C.M.D.  Code Status: DNR, given the MOST in Sept. Had a talk with daughter and would like to speak with her and her son about some of the medical interventions, if she were to have a pulse and is breathing. She will bring form back at next visit.    Goals of Care:  Advanced Directives 06/07/2017  Does Patient Have a Medical Advance Directive? Yes  Type of Paramedic of [place];Living will;Out of facility DNR (pink MOST or yellow form)  Does patient want to make changes to medical advance directive? No - Patient declined  Copy of [place] in Chart? Yes  Pre-existing out of facility DNR order (yellow form or pink MOST form) Yellow form placed in chart (order not valid for inpatient use)     Chief Complaint  Patient presents with  . Medical Management of Chronic Issues    56mth follow-up, discuss most form (pt has form)    HPI: Patient is a 84 y.o. female seen today for medical management of chronic diseases. Comes in alone today to discuss the MOST form. She is well groomed, lives at [facility] and is oriented in conversation. She provides a good history as she takes notes at each visit.   In Nov 13th, 2018. She slipped out of a four wheel chair at church. Lightly bumped her head on floor. Without LOC. No bleeding from her head. No confusion. Later that day she developed right shoulder/clavical pain and wrist pain. She went to Ortho provider, unsure of which, and had x-rays of shoulder and wrist. They were negative. She was sore the next day. She did not have to take any medication for this soreness.    Right hip: Noted increased pain on cold days. Has been seen by Dr. Nelva Bush who did an MRI, unsure of results. Previous  Mild hip joint space loss bilaterally. Heating pad and warm showers have helped. She has not taken any pain medications at this time. No other associated factors.     Sleeping okay until she gets things on her mind that bother her then she cant sleep as well. She also wakes up in the middle of the night, 1-2  times and will have a hard time following asleep sometimes. She cooks two meals a day and has one provided from [employer]. She loves to cook her greens. She enjoys fruits and vegetables. Is not sure if she is eating enough protein. Doesn't feel she drinks enough water either. Overall skin is good by dry, she is using Aveeno to help with this. No confusion noted, but sometimes will have a hard time finding the words she wants to use. She is still driving herself around.She states she can see well in the dark so she drives at night.    Past Medical History:  Diagnosis Date  . Cervicalgia   . Chest pain at rest 01/07/2012  . Displacement of cervical intervertebral disc without myelopathy   . Diverticulosis of colon (without mention of hemorrhage)   . Female stress incontinence   . Inguinal hernia without mention of obstruction or gangrene, unilateral or unspecified, (not specified as recurrent)   . Internal hemorrhoids without mention of complication   . Lumbago   . Macular degeneration (senile) of retina, unspecified   . Osteoarthrosis, unspecified whether generalized or localized, unspecified site   . Other and unspecified hyperlipidemia   . Pain in joint, hand   .  Palpitations   . Reflux esophagitis   . Scoliosis (and kyphoscoliosis), idiopathic   . Senile osteoporosis   . Skin disorder   . Thyroid disease   . Unruptured popliteal cyst 06/24/2014   Right knee   . Unspecified essential hypertension   . Unspecified glaucoma(365.9)   . Unspecified hypothyroidism   . Unspecified vitamin D deficiency     Past Surgical History:  Procedure Laterality Date  . ABDOMINAL HYSTERECTOMY  1975   Dr Mallie Mussel  . APPENDECTOMY  1988  . cardiolite myocardial perfusion study    . DOPPLER ECHOCARDIOGRAPHY    . EYE SURGERY Bilateral 2009   cataract removed  right eye, Dr Charise Killian  . FOOT SURGERY  august 2013   Doran Durand, MD  . NM MYOVIEW LTD     negative  . TONSILLECTOMY  1937  . TOOTH EXTRACTION  09/16/13   Dr Carlos American    Allergies  Allergen Reactions  . Latex     To face  . Erythromycin   . Macrodantin [Nitrofurantoin Macrocrystal]   . Penicillins   . Septra [Sulfamethoxazole-Trimethoprim]   . Sulfa Antibiotics   . Trimethoprim     Outpatient Encounter Medications as of 06/07/2017  Medication Sig  . bimatoprost (LUMIGAN) 0.01 % SOLN Place 1 drop into both eyes at bedtime.  . calcium elemental as carbonate (BARIATRIC TUMS ULTRA) 400 MG tablet Chew 500 mg by mouth daily.   . Cholecalciferol (VITAMIN D3) 2000 UNITS TABS Take 2,000 Units/day by mouth 2 (two) times daily.  . clopidogrel (PLAVIX) 75 MG tablet TAKE 1 TABLET ONCE A DAY TO PREVENT STROKE.  . hydrocortisone cream 1 % Apply 1 application topically as needed for itching.  . metoprolol succinate (TOPROL-XL) 25 MG 24 hr tablet TAKE 1 TABLET ONCE TO REGULATE HEART AND CONTROL BLOOD PRESSURE.  . Multiple Vitamins-Minerals (CENTRUM SILVER ULTRA WOMENS PO) Take one tablet by mouth once daily for supplement  . Multiple Vitamins-Minerals (PRESERVISION AREDS 2 PO) Take by mouth 2 (two) times daily.  Vladimir Faster Glycol-Propyl Glycol (SYSTANE OP) Place 1 drop into both eyes 2 (two) times daily. May use up to 3 times if needed.  . ranitidine (ZANTAC) 150 MG tablet Take 150 mg by mouth daily as needed.    No facility-administered encounter medications on file as of 06/07/2017.     Review of Systems:  Review of Systems  Constitutional: Negative.  Negative for chills, fever and malaise/fatigue.  HENT: Negative for ear pain, hearing loss and sinus pain.        Wears glasses-up to date, sees stoneking  Eyes: Negative.   Respiratory: Negative.   Cardiovascular: Positive for leg swelling. Negative for chest pain and palpitations.  Gastrointestinal: Positive for heartburn. Negative for blood in  stool.       Occasional heartburn  Genitourinary: Positive for frequency and urgency.  Musculoskeletal: Positive for falls and joint pain.       Right shoulder and hip   Nov 2018  Skin:       Dry skin  Neurological: Negative.  Negative for weakness.  Psychiatric/Behavioral: Positive for memory loss.       Mild difficult with finding the right word she wants to use     Health Maintenance  Topic Date Due  . MAMMOGRAM  04/30/2018  . TETANUS/TDAP  03/20/2020  . INFLUENZA VACCINE  Completed  . DEXA SCAN  Completed  . PNA vac Low Risk Adult  Completed    Physical Exam: Vitals:   06/07/17 1441  BP: 128/80  Pulse: 60  Temp: 97.6 F (36.4 C)  TempSrc: Oral  SpO2: 96%  Weight: 124 lb (56.2 kg)   Body mass index is 22.68 kg/m. Physical Exam  Constitutional: She is oriented to person, place, and time. She appears well-developed and well-nourished.  HENT:  Head: Normocephalic and atraumatic.  Eyes: Conjunctivae are normal.  Neck: Normal range of motion. Neck supple.  Cardiovascular: Normal rate, regular rhythm, normal heart sounds and intact distal pulses.  Pulmonary/Chest: Effort normal and breath sounds normal.  Abdominal: Soft. Bowel sounds are normal.  Musculoskeletal: Normal range of motion.  Neurological: She is alert and oriented to person, place, and time.  Skin: Skin is warm and dry.  Psychiatric: She has a normal mood and affect. Her behavior is normal. Judgment and thought content normal.  Vitals reviewed.   Labs reviewed: Basic Metabolic Panel: Recent Labs    06/23/16 0001 01/30/17 0827  NA 138 137  K 4.1 4.1  CL 103 101  CO2 25 27  GLUCOSE 85 78  BUN 22 25  CREATININE 0.68 0.73  CALCIUM 9.1 9.2  TSH 3.87  --    Liver Function Tests: Recent Labs    06/23/16 0001  AST 20  ALT 16  ALKPHOS 62  BILITOT 0.4  PROT 6.5  ALBUMIN 3.8   No results for input(s): LIPASE, AMYLASE in the last 8760 hours. No results for input(s): AMMONIA in the last  8760 hours. CBC: No results for input(s): WBC, NEUTROABS, HGB, HCT, MCV, PLT in the last 8760 hours. Lipid Panel: Recent Labs    06/23/16 0001  CHOL 197  HDL 90  LDLCALC 90  TRIG 87  CHOLHDL 2.2   Lab Results  Component Value Date   HGBA1C 5.3 05/02/2013    Procedures since last visit: No results found.  Assessment/Plan  1. Gastroesophageal reflux disease without esophagitis She has stopped taking the Zantac daily and only uses it as needed. She is eating well and has no weight loss. Advised to continue Zantac as needed.   2. Right hip pain She demonstrated some loss of joint space back in 2010, this likely continued over the years. She is still very active and does not require any medication on a daily bases to move or feel better.  3. Advance care planning Spoke at length about the MOST form and we will waiting to hear back from her after she has spoke with daughter and son. Encouraged to take her time and make the choices that she desires would be best for her to have.   4. Fall from chair, initial encounter She has a "slip" out of chair back in Nov. She did not come in after the event, but today she seems well with out consequences of falling. Advised to be aware of the types of chairs she sits in and gets out of.    5. Female stress incontinence She is experiencing some incontinence. She manages this with using the restroom frequently throughout the day. Advised to continue this and wear the pads. Encouraged to do kegels.   6. Senile osteopenia Her DEXA scan from July 2018 T score was -2.3. She is maintained on calcium and vitamin D. Advised to continue as well as to maintain weightbearing exercises.   - CBC with Differential/Platelet; Future - COMPLETE METABOLIC PANEL WITH GFR; Future - VITAMIN D 25 Hydroxy (Vit-D Deficiency, Fractures); Future  7. Pure hypercholesterolemia She is diet controlled at this time. Her lipids last year are stable  and within normal  limits. Will recheck.   - Lipid panel; Future  8. Vitamin D deficiency Will evaluate her levels to make sure she is getting enough from diet and supplements.   - VITAMIN D 25 Hydroxy (Vit-D Deficiency, Fractures); Future   Labs/tests ordered: Not fasting today, will come back in soon to have fasting labs.    Orders Placed This Encounter  Procedures  . CBC with Differential/Platelet    Standing Status:   Future    Standing Expiration Date:   08/10/2017  . COMPLETE METABOLIC PANEL WITH GFR    Standing Status:   Future    Standing Expiration Date:   08/10/2017  . Lipid panel    Standing Status:   Future    Standing Expiration Date:   08/10/2017  . VITAMIN D 25 Hydroxy (Vit-D Deficiency, Fractures)    Standing Status:   Future    Standing Expiration Date:   08/10/2017     Next appt:   10/04/2017  Karen Kays, DNP [employer] [facility] [phone], [place] [address] Cell Phone (Mon-Fri 8am-5pm):  [phone] On Call:  [phone] & follow prompts after 5pm & weekends Office Phone:  [phone] Office Fax:  [phone]

## 2017-06-08 ENCOUNTER — Emergency Department (HOSPITAL_COMMUNITY)
Admission: EM | Admit: 2017-06-08 | Discharge: 2017-06-09 | Disposition: A | Discharge status: Home / Self Care | Payer: Medicare Other | Attending: Emergency Medicine | Admitting: Emergency Medicine

## 2017-06-08 ENCOUNTER — Emergency Department (HOSPITAL_COMMUNITY): Payer: Medicare Other

## 2017-06-08 ENCOUNTER — Encounter (HOSPITAL_COMMUNITY): Payer: Self-pay | Admitting: Emergency Medicine

## 2017-06-08 ENCOUNTER — Telehealth: Payer: Self-pay

## 2017-06-08 ENCOUNTER — Other Ambulatory Visit: Payer: Self-pay

## 2017-06-08 DIAGNOSIS — E039 Hypothyroidism, unspecified: Secondary | ICD-10-CM | POA: Diagnosis not present

## 2017-06-08 DIAGNOSIS — R1031 Right lower quadrant pain: Secondary | ICD-10-CM | POA: Diagnosis present

## 2017-06-08 DIAGNOSIS — K4021 Bilateral inguinal hernia, without obstruction or gangrene, recurrent: Secondary | ICD-10-CM

## 2017-06-08 DIAGNOSIS — Z79899 Other long term (current) drug therapy: Secondary | ICD-10-CM | POA: Insufficient documentation

## 2017-06-08 DIAGNOSIS — K4020 Bilateral inguinal hernia, without obstruction or gangrene, not specified as recurrent: Secondary | ICD-10-CM | POA: Diagnosis not present

## 2017-06-08 DIAGNOSIS — Z91040 Latex allergy status: Secondary | ICD-10-CM | POA: Diagnosis not present

## 2017-06-08 DIAGNOSIS — K4090 Unilateral inguinal hernia, without obstruction or gangrene, not specified as recurrent: Secondary | ICD-10-CM | POA: Diagnosis not present

## 2017-06-08 DIAGNOSIS — I10 Essential (primary) hypertension: Secondary | ICD-10-CM | POA: Insufficient documentation

## 2017-06-08 DIAGNOSIS — Z7902 Long term (current) use of antithrombotics/antiplatelets: Secondary | ICD-10-CM | POA: Diagnosis not present

## 2017-06-08 LAB — BASIC METABOLIC PANEL
Anion gap: 11 (ref 5–15)
BUN: 21 mg/dL — ABNORMAL HIGH (ref 6–20)
CO2: 23 mmol/L (ref 22–32)
Calcium: 9.1 mg/dL (ref 8.9–10.3)
Chloride: 103 mmol/L (ref 101–111)
Creatinine, Ser: 0.56 mg/dL (ref 0.44–1.00)
GFR calc Af Amer: >60 mL/min (ref >60)
GFR calc non Af Amer: >60 mL/min (ref >60)
Glucose, Bld: 92 mg/dL (ref 65–99)
Potassium: 4.1 mmol/L (ref 3.5–5.1)
Sodium: 137 mmol/L (ref 135–145)

## 2017-06-08 LAB — CBC WITH DIFFERENTIAL/PLATELET
Basophils Absolute: 0 10*3/uL (ref 0.0–0.1)
Basophils Relative: 1 %
Eosinophils Absolute: 0.2 10*3/uL (ref 0.0–0.7)
Eosinophils Relative: 3 %
HCT: 38.7 % (ref 36.0–46.0)
Hemoglobin: 12.9 g/dL (ref 12.0–15.0)
Lymphocytes Relative: 38 %
Lymphs Abs: 2.3 10*3/uL (ref 0.7–4.0)
MCH: 31.2 pg (ref 26.0–34.0)
MCHC: 33.3 g/dL (ref 30.0–36.0)
MCV: 93.5 fL (ref 78.0–100.0)
Monocytes Absolute: 0.4 10*3/uL (ref 0.1–1.0)
Monocytes Relative: 6 %
Neutro Abs: 3.1 10*3/uL (ref 1.7–7.7)
Neutrophils Relative %: 52 %
Platelets: 206 10*3/uL (ref 150–400)
RBC: 4.14 MIL/uL (ref 3.87–5.11)
RDW: 14.8 % (ref 11.5–15.5)
WBC: 6 10*3/uL (ref 4.0–10.5)

## 2017-06-08 MED ORDER — IOPAMIDOL (ISOVUE-300) INJECTION 61%
INTRAVENOUS | Status: AC
  Administered 2017-06-08: 100 mL
  Filled 2017-06-08: qty 100

## 2017-06-08 MED ORDER — CIPROFLOXACIN IN D5W 400 MG/200ML IV SOLN
400.0000 mg | Freq: Once | INTRAVENOUS | Status: DC
  Administered 2017-06-08: 400 mg via INTRAVENOUS
  Filled 2017-06-08: qty 200

## 2017-06-08 NOTE — ED Triage Notes (Signed)
PT reports she has "inguinal hernia that hurts really bad" started yesterday

## 2017-06-08 NOTE — Telephone Encounter (Signed)
I agree with that advice.  We had discussed those precautions yesterday at her appt.

## 2017-06-08 NOTE — Telephone Encounter (Signed)
Patient called and left a message on Clinical intake during lunch asking if we had heard any advise regarding her hernia.   I called patient back and patient informed me that she has already gone to the ER at [facility].

## 2017-06-08 NOTE — ED Provider Notes (Signed)
[facility] EMERGENCY DEPARTMENT Provider Note   CSN: [id number] Arrival date & time: 06/08/17  1432     History   Chief Complaint Chief Complaint  Patient presents with  . Inguinal Hernia    HPI [name] is a 84 y.o. female.  HPI   84 year old female with right groin pain.  She has a known right inguinal hernia.  She has had this at least for several years.  She reports that she is typically asymptomatic.  The past day she has had pain and a bulge of the site.  Denies any nausea or vomiting.  Past surgical history is significant for hysterectomy and appendectomy.   Past Medical History:  Diagnosis Date  . Cervicalgia   . Chest pain at rest 01/07/2012  . Displacement of cervical intervertebral disc without myelopathy   . Diverticulosis of colon (without mention of hemorrhage)   . Female stress incontinence   . Inguinal hernia without mention of obstruction or gangrene, unilateral or unspecified, (not specified as recurrent)   . Internal hemorrhoids without mention of complication   . Lumbago   . Macular degeneration (senile) of retina, unspecified   . Osteoarthrosis, unspecified whether generalized or localized, unspecified site   . Other and unspecified hyperlipidemia   . Pain in joint, hand   . Palpitations   . Reflux esophagitis   . Scoliosis (and kyphoscoliosis), idiopathic   . Senile osteoporosis   . Skin disorder   . Thyroid disease   . Unruptured popliteal cyst 06/24/2014   Right knee   . Unspecified essential hypertension   . Unspecified glaucoma(365.9)   . Unspecified hypothyroidism   . Unspecified vitamin D deficiency     Patient Active Problem List   Diagnosis Date Noted  . Advance care planning 02/01/2017  . Right hip pain 08/29/2016  . Pain of right sacroiliac joint 06/27/2016  . Edema 03/08/2016  . Right shoulder pain 03/10/2015  . Corn 12/30/2014  . Unruptured popliteal cyst 06/24/2014  . Female stress  incontinence   . Inguinal hernia 12/24/2013  . Balance problem 12/24/2013  . Brain aneurysm 05/27/2013  . TIA (transient ischemic attack) 05/02/2013  . GERD (gastroesophageal reflux disease) 03/24/2013  . Hypothyroidism   . Senile osteopenia   . Unspecified vitamin D deficiency   . Macular degeneration   . Unspecified glaucoma(365.9)   . Essential hypertension   . Hyperlipidemia     Past Surgical History:  Procedure Laterality Date  . ABDOMINAL HYSTERECTOMY  1975   Dr Mallie Mussel  . APPENDECTOMY  1988  . cardiolite myocardial perfusion study    . DOPPLER ECHOCARDIOGRAPHY    . EYE SURGERY Bilateral 2009   cataract removed right eye, Dr Charise Killian  . FOOT SURGERY  august 2013   Doran Durand, MD  . NM MYOVIEW LTD     negative  . TONSILLECTOMY  1937  . TOOTH EXTRACTION  09/16/13   Dr Carlos American    OB History    No data available       Home Medications    Prior to Admission medications   Medication Sig Start Date End Date Taking? Authorizing Provider  acetaminophen (TYLENOL) 500 MG tablet Take 500 mg by mouth every 6 (six) hours as needed for mild pain.   Yes [provider]  bimatoprost (LUMIGAN) 0.01 % SOLN Place 1 drop into both eyes at bedtime.   Yes [provider]  calcium carbonate (TUMS - DOSED IN MG ELEMENTAL CALCIUM) 500 MG chewable  tablet Chew 500 mg by mouth daily.    Yes [provider]  Cholecalciferol (VITAMIN D3) 2000 UNITS TABS Take 2,000 Units/day by mouth 2 (two) times daily.   Yes [provider]  clopidogrel (PLAVIX) 75 MG tablet TAKE 1 TABLET ONCE A DAY TO PREVENT STROKE. 06/27/16  Yes Estill Dooms, MD  hydrocortisone cream 1 % Apply 1 application topically as needed for itching.   Yes [provider]  metoprolol succinate (TOPROL-XL) 25 MG 24 hr tablet TAKE 1 TABLET ONCE TO REGULATE HEART AND CONTROL BLOOD PRESSURE. 05/25/17  Yes Reed, Tiffany L, DO  Multiple Vitamins-Minerals (PRESERVISION AREDS 2 PO) Take by mouth 2 (two)  times daily.   Yes [provider]  Polyethyl Glycol-Propyl Glycol (SYSTANE OP) Place 1 drop into both eyes 2 (two) times daily. May use up to 3 times if needed.   Yes [provider]  ranitidine (ZANTAC) 150 MG tablet Take 150 mg by mouth daily as needed.    Yes [provider]    Family History Family History  Problem Relation Age of Onset  . CAD Mother   . Parkinson's disease Mother   . Cancer Father        colon cancer  . Parkinson's disease Father   . Stroke Maternal Grandmother     Social History Social History   Tobacco Use  . Smoking status: Never Smoker  . Smokeless tobacco: Never Used  Substance Use Topics  . Alcohol use: No  . Drug use: No     Allergies   Latex; Erythromycin; Macrodantin [nitrofurantoin macrocrystal]; Penicillins; Septra [sulfamethoxazole-trimethoprim]; Sulfa antibiotics; and Trimethoprim   Review of Systems Review of Systems  All systems reviewed and negative, other than as noted in HPI.   Physical Exam Updated Vital Signs BP (!) 151/63   Pulse 65   Temp (!) 97.4 F (36.3 C) (Oral)   Resp 19   Ht 5\' 2"  (1.575 m)   Wt 56.2 kg (124 lb)   SpO2 100%   BMI 22.68 kg/m   Physical Exam  Constitutional: She appears well-developed and well-nourished. No distress.  HENT:  Head: Normocephalic and atraumatic.  Eyes: Conjunctivae are normal. Right eye exhibits no discharge. Left eye exhibits no discharge.  Neck: Neck supple.  Cardiovascular: Normal rate, regular rhythm and normal heart sounds. Exam reveals no gallop and no friction rub.  No murmur heard. Pulmonary/Chest: Effort normal and breath sounds normal. No respiratory distress.  Abdominal: Soft. She exhibits no distension. There is no tenderness.  Abdomen soft.  Nondistended.  Low transverse right lower quadrant surgical scars.  Right inguinal hernia.  No overlying skin changes.  I cannot reduce it.  She does not seem overly tender with palpation.    Musculoskeletal: She exhibits no edema or tenderness.  Neurological: She is alert.  Skin: Skin is warm and dry.  Psychiatric: She has a normal mood and affect. Her behavior is normal. Thought content normal.  Nursing note and vitals reviewed.    ED Treatments / Results  Labs (all labs ordered are listed, but only abnormal results are displayed) Labs Reviewed  BASIC METABOLIC PANEL - Abnormal; Notable for the following components:      Result Value   BUN 21 (*)    All other components within normal limits  CBC WITH DIFFERENTIAL/PLATELET    EKG  EKG Interpretation None       Radiology Ct Abdomen Pelvis W Contrast  Result Date: 06/08/2017 CLINICAL DATA:  Hernia pain  in the pelvis x1 day. EXAM: CT ABDOMEN AND PELVIS WITH CONTRAST TECHNIQUE: Multidetector CT imaging of the abdomen and pelvis was performed using the standard protocol following bolus administration of intravenous contrast. CONTRAST:  100 cc ISOVUE-300 IOPAMIDOL (ISOVUE-300) INJECTION 61% COMPARISON:  03/15/2016 FINDINGS: Lower chest: Stable cardiomegaly without pericardial effusion. Bibasilar dependent atelectasis. No Hepatobiliary: Nondistended gallbladder without stones. No space-occupying mass of the liver. No biliary dilatation. Pancreas: Normal Spleen: Normal Adrenals/Urinary Tract: Normal bilateral adrenal glands. Stable appearance of the kidneys with right pelvic kidney redemonstrated. Chronic ectasia of the renal collecting systems moderate on the right and mild on the left. No nephrolithiasis, enhancing renal mass nor hydroureter. Urinary bladder is physiologically distended. Stomach/Bowel: With bilateral inguinal hernias are demonstrated, on the right containing small bowel and on the left portions of the distal descending and proximal sigmoid. Moderate fecal retention is noted. No significant small nor large bowel dilatation is seen. There is colonic diverticulosis along the sigmoid colon without acute  diverticulitis. Contracted stomach. There is normal small bowel rotation. Status post appendectomy. Vascular/Lymphatic: Mild aortoiliac and branch vessel atherosclerosis. No aneurysm. No adenopathy. Reproductive: Hysterectomy.  No adnexal mass. Other: No free air nor free fluid. Musculoskeletal: Multilevel degenerative disc disease L2 through S1 with associated facet arthropathy and levoconvex curvature of the lumbar spine consistent with spondylosis. IMPRESSION: 1. Chronic small bowel containing right inguinal hernia with new large bowel containing left inguinal hernia. No obstruction is noted. A large amount of fecal retention is seen within the colon. No inflammatory change is noted. No definite evidence of incarceration. 2. Stable cardiomegaly.  No active pulmonary disease. 3. Low lying right pelvic kidney with chronic bilateral renal collecting system ectasia. No nephrolithiasis nor obstructive uropathy. 4. Sigmoid diverticulosis without acute diverticulitis. 5. Status post appendectomy and hysterectomy. Electronically Signed   By: Ashley Royalty M.D.   On: 06/08/2017 21:56    Procedures Procedures (including critical care time)  Medications Ordered in ED Medications  iopamidol (ISOVUE-300) 61 % injection (100 mLs  Contrast Given 06/08/17 2131)     Initial Impression / Assessment and Plan / ED Course  I have reviewed the triage vital signs and the nursing notes.  Pertinent labs & imaging results that were available during my care of the patient were reviewed by me and considered in my medical decision making (see chart for details).     84 year old female with right groin pain.  She has has bilateral inguinal hernias.  I cannot reduce the wound she is symptomatic with.  She denies any obstructive symptoms.  No dilated bowel loops on imaging.  Final Clinical Impressions(s) / ED Diagnoses   Final diagnoses:  Bilateral recurrent inguinal hernia without obstruction or gangrene    ED Discharge  Orders    None       Virgel Manifold, MD 06/08/17 2319

## 2017-06-08 NOTE — Telephone Encounter (Signed)
Patient called stating that she was having pain at inguinal hernia site. Pt stated that pain has worsened this morning and she is very worried about it. There are no appointment available in office today, so patient was advised to seek care at urgent care or emergency room if pain worsened. Pt agreed.   Patient asked if there were any recommendations from provider as to what she can do for the pain. Please advise.

## 2017-06-08 NOTE — Consult Note (Signed)
Reason for Consult:incarcerated right inguinal hernia Referring Physician: Dr Ina Homes [name] is an 84 y.o. female.  HPI: 85yo wf with htn, hypothyroidism, with known right inguinal hernia came to ed for persistent right inguinal pain. Started a little last night but persisted this morning and was painful. +bulge. No n/v. Had bm this am. Ate breakfast. Came to ed bc of ongoing pain.  EDP attempted to reduce but said he couldn't so general sx called  Pt reports she has intermittent right groin bulge, generally goes down at night; no prior pain.   [facility], drives, does water aerobics and gym class several times a week   Past Medical History:  Diagnosis Date  . Cervicalgia   . Chest pain at rest 01/07/2012  . Displacement of cervical intervertebral disc without myelopathy   . Diverticulosis of colon (without mention of hemorrhage)   . Female stress incontinence   . Inguinal hernia without mention of obstruction or gangrene, unilateral or unspecified, (not specified as recurrent)   . Internal hemorrhoids without mention of complication   . Lumbago   . Macular degeneration (senile) of retina, unspecified   . Osteoarthrosis, unspecified whether generalized or localized, unspecified site   . Other and unspecified hyperlipidemia   . Pain in joint, hand   . Palpitations   . Reflux esophagitis   . Scoliosis (and kyphoscoliosis), idiopathic   . Senile osteoporosis   . Skin disorder   . Thyroid disease   . Unruptured popliteal cyst 06/24/2014   Right knee   . Unspecified essential hypertension   . Unspecified glaucoma(365.9)   . Unspecified hypothyroidism   . Unspecified vitamin D deficiency     Past Surgical History:  Procedure Laterality Date  . ABDOMINAL HYSTERECTOMY  1975   Dr Mallie Mussel  . APPENDECTOMY  1988  . cardiolite myocardial perfusion study    . DOPPLER ECHOCARDIOGRAPHY    . EYE SURGERY Bilateral 2009   cataract removed right eye,  Dr Charise Killian  . FOOT SURGERY  august 2013   Doran Durand, MD  . NM MYOVIEW LTD     negative  . TONSILLECTOMY  1937  . TOOTH EXTRACTION  09/16/13   Dr Carlos American    Family History  Problem Relation Age of Onset  . CAD Mother   . Parkinson's disease Mother   . Cancer Father        colon cancer  . Parkinson's disease Father   . Stroke Maternal Grandmother     Social History:  reports that  has never smoked. she has never used smokeless tobacco. She reports that she does not drink alcohol or use drugs.  Allergies:  Allergies  Allergen Reactions  . Latex     To face  . Erythromycin   . Macrodantin [Nitrofurantoin Macrocrystal]   . Penicillins   . Septra [Sulfamethoxazole-Trimethoprim]   . Sulfa Antibiotics   . Trimethoprim     Medications: I have reviewed the patient's current medications.  Results for orders placed or performed during the hospital encounter of 06/08/17 (from the past 48 hour(s))  CBC with Differential     Status: None   Collection Time: 06/08/17  8:13 PM  Result Value Ref Range   WBC 6.0 4.0 - 10.5 K/uL   RBC 4.14 3.87 - 5.11 MIL/uL   Hemoglobin 12.9 12.0 - 15.0 g/dL   HCT 38.7 36.0 - 46.0 %   MCV 93.5 78.0 - 100.0 fL   MCH 31.2 26.0 -  34.0 pg   MCHC 33.3 30.0 - 36.0 g/dL   RDW 14.8 11.5 - 15.5 %   Platelets 206 150 - 400 K/uL   Neutrophils Relative % 52 %   Neutro Abs 3.1 1.7 - 7.7 K/uL   Lymphocytes Relative 38 %   Lymphs Abs 2.3 0.7 - 4.0 K/uL   Monocytes Relative 6 %   Monocytes Absolute 0.4 0.1 - 1.0 K/uL   Eosinophils Relative 3 %   Eosinophils Absolute 0.2 0.0 - 0.7 K/uL   Basophils Relative 1 %   Basophils Absolute 0.0 0.0 - 0.1 K/uL  Basic metabolic panel     Status: Abnormal   Collection Time: 06/08/17  8:13 PM  Result Value Ref Range   Sodium 137 135 - 145 mmol/L   Potassium 4.1 3.5 - 5.1 mmol/L   Chloride 103 101 - 111 mmol/L   CO2 23 22 - 32 mmol/L   Glucose, Bld 92 65 - 99 mg/dL   BUN 21 (H) 6 - 20 mg/dL   Creatinine, Ser 0.56 0.44 -  1.00 mg/dL   Calcium 9.1 8.9 - 10.3 mg/dL   GFR calc non Af Amer >60 >60 mL/min   GFR calc Af Amer >60 >60 mL/min    Comment: (NOTE) The eGFR has been calculated using the CKD EPI equation. This calculation has not been validated in all clinical situations. eGFR's persistently <60 mL/min signify possible Chronic Kidney Disease.    Anion gap 11 5 - 15    Ct Abdomen Pelvis W Contrast  Result Date: 06/08/2017 CLINICAL DATA:  Hernia pain in the pelvis x1 day. EXAM: CT ABDOMEN AND PELVIS WITH CONTRAST TECHNIQUE: Multidetector CT imaging of the abdomen and pelvis was performed using the standard protocol following bolus administration of intravenous contrast. CONTRAST:  100 cc ISOVUE-300 IOPAMIDOL (ISOVUE-300) INJECTION 61% COMPARISON:  03/15/2016 FINDINGS: Lower chest: Stable cardiomegaly without pericardial effusion. Bibasilar dependent atelectasis. No Hepatobiliary: Nondistended gallbladder without stones. No space-occupying mass of the liver. No biliary dilatation. Pancreas: Normal Spleen: Normal Adrenals/Urinary Tract: Normal bilateral adrenal glands. Stable appearance of the kidneys with right pelvic kidney redemonstrated. Chronic ectasia of the renal collecting systems moderate on the right and mild on the left. No nephrolithiasis, enhancing renal mass nor hydroureter. Urinary bladder is physiologically distended. Stomach/Bowel: With bilateral inguinal hernias are demonstrated, on the right containing small bowel and on the left portions of the distal descending and proximal sigmoid. Moderate fecal retention is noted. No significant small nor large bowel dilatation is seen. There is colonic diverticulosis along the sigmoid colon without acute diverticulitis. Contracted stomach. There is normal small bowel rotation. Status post appendectomy. Vascular/Lymphatic: Mild aortoiliac and branch vessel atherosclerosis. No aneurysm. No adenopathy. Reproductive: Hysterectomy.  No adnexal mass. Other: No free  air nor free fluid. Musculoskeletal: Multilevel degenerative disc disease L2 through S1 with associated facet arthropathy and levoconvex curvature of the lumbar spine consistent with spondylosis. IMPRESSION: 1. Chronic small bowel containing right inguinal hernia with new large bowel containing left inguinal hernia. No obstruction is noted. A large amount of fecal retention is seen within the colon. No inflammatory change is noted. No definite evidence of incarceration. 2. Stable cardiomegaly.  No active pulmonary disease. 3. Low lying right pelvic kidney with chronic bilateral renal collecting system ectasia. No nephrolithiasis nor obstructive uropathy. 4. Sigmoid diverticulosis without acute diverticulitis. 5. Status post appendectomy and hysterectomy. Electronically Signed   By: Ashley Royalty M.D.   On: 06/08/2017 21:56    Review of Systems  Constitutional: Negative for weight loss.  HENT: Negative for nosebleeds.   Eyes: Negative for blurred vision.  Respiratory: Negative for shortness of breath.   Cardiovascular: Negative for chest pain, palpitations, orthopnea and PND.       Denies DOE  Gastrointestinal: Negative for constipation, diarrhea, nausea and vomiting.  Genitourinary: Negative for dysuria and hematuria.  Musculoskeletal: Negative.   Skin: Negative for itching and rash.  Neurological: Negative for dizziness, focal weakness, seizures, loss of consciousness and headaches.       Denies TIAs, amaurosis fugax  Endo/Heme/Allergies: Does not bruise/bleed easily.  Psychiatric/Behavioral: The patient is not nervous/anxious.    Blood pressure (!) 174/68, pulse 63, temperature (!) 97.4 F (36.3 C), temperature source Oral, resp. rate 17, height 5' 2" (1.575 m), weight 56.2 kg (124 lb), SpO2 98 %. Physical Exam  Vitals reviewed. Constitutional: She is oriented to person, place, and time. She appears well-developed and well-nourished. No distress.  HENT:  Head: Normocephalic and atraumatic.    Right Ear: External ear normal.  Left Ear: External ear normal.  Eyes: Conjunctivae are normal. No scleral icterus.  Neck: Normal range of motion. Neck supple. No tracheal deviation present. No thyromegaly present.  Cardiovascular: Normal rate and normal heart sounds.  Respiratory: Effort normal and breath sounds normal. No stridor. No respiratory distress. She has no wheezes.  GI: Soft. She exhibits no distension. There is no tenderness. There is no rebound and no guarding. A hernia is present. Hernia confirmed positive in the right inguinal area and confirmed positive in the left inguinal area.    +b/l inguinal hernia, easily reducible  Musculoskeletal: She exhibits no edema or tenderness.  Lymphadenopathy:    She has no cervical adenopathy.  Neurological: She is alert and oriented to person, place, and time. She exhibits normal muscle tone.  Skin: Skin is warm and dry. No rash noted. She is not diaphoretic. No erythema. No pallor.  Psychiatric: She has a normal mood and affect. Her behavior is normal. Judgment and thought content normal.    Assessment/Plan: Symptomatic Right inguinal hernia Left inguinal hernia HTN hypothryoidism   On my arrival, pt is resting comfortably. No incarcerated hernia on exam. Hernia is already reduced. I was able to palpate b/l inguinal hernias with valsalva but both easily reducible. Labs normal. No sign of bowel obstruction on ct.  I think she can be released with outpt f/u with [facility] to discuss elective repair  Discussed signs/symptoms of incarceration/strangulation with pt and daughter  Discussed hernias, typical repair and typical recovery.   Leighton Ruff. Redmond Pulling, MD, FACS General, Bariatric, & Minimally Invasive Surgery [facility]   Greer Pickerel 06/08/2017, 11:56 PM

## 2017-06-08 NOTE — ED Notes (Addendum)
Pt ambulatory to restroom with steady gait.

## 2017-06-12 ENCOUNTER — Other Ambulatory Visit: Payer: Medicare Other

## 2017-06-12 DIAGNOSIS — E559 Vitamin D deficiency, unspecified: Secondary | ICD-10-CM | POA: Diagnosis not present

## 2017-06-12 DIAGNOSIS — E7800 Pure hypercholesterolemia, unspecified: Secondary | ICD-10-CM

## 2017-06-12 DIAGNOSIS — M8580 Other specified disorders of bone density and structure, unspecified site: Secondary | ICD-10-CM | POA: Diagnosis not present

## 2017-06-13 ENCOUNTER — Other Ambulatory Visit: Payer: Medicare Other

## 2017-06-13 LAB — CBC WITH DIFFERENTIAL/PLATELET
Basophils Absolute: 49 cells/uL (ref 0–200)
Basophils Relative: 0.9 %
Eosinophils Absolute: 313 cells/uL (ref 15–500)
Eosinophils Relative: 5.8 %
HCT: 37.6 % (ref 35.0–45.0)
Hemoglobin: 12.8 g/dL (ref 11.7–15.5)
Lymphs Abs: 2036 cells/uL (ref 850–3900)
MCH: 30.6 pg (ref 27.0–33.0)
MCHC: 34 g/dL (ref 32.0–36.0)
MCV: 90 fL (ref 80.0–100.0)
MPV: 10.4 fL (ref 7.5–12.5)
Monocytes Relative: 8.4 %
Neutro Abs: 2549 cells/uL (ref 1500–7800)
Neutrophils Relative %: 47.2 %
Platelets: 231 10*3/uL (ref 140–400)
RBC: 4.18 10*6/uL (ref 3.80–5.10)
RDW: 13.2 % (ref 11.0–15.0)
Total Lymphocyte: 37.7 %
WBC mixed population: 454 cells/uL (ref 200–950)
WBC: 5.4 10*3/uL (ref 3.8–10.8)

## 2017-06-13 LAB — COMPLETE METABOLIC PANEL WITH GFR
AG Ratio: 1.5 (calc) (ref 1.0–2.5)
ALT: 16 U/L (ref 6–29)
AST: 21 U/L (ref 10–35)
Albumin: 3.9 g/dL (ref 3.6–5.1)
Alkaline phosphatase (APISO): 55 U/L (ref 33–130)
BUN: 21 mg/dL (ref 7–25)
CO2: 31 mmol/L (ref 20–32)
Calcium: 9 mg/dL (ref 8.6–10.4)
Chloride: 102 mmol/L (ref 98–110)
Creat: 0.68 mg/dL (ref 0.60–0.88)
GFR, Est African American: 92 mL/min/{1.73_m2} (ref >=60)
GFR, Est Non African American: 79 mL/min/{1.73_m2} (ref >=60)
Globulin: 2.6 g/dL (calc) (ref 1.9–3.7)
Glucose, Bld: 76 mg/dL (ref 65–99)
Potassium: 4.2 mmol/L (ref 3.5–5.3)
Sodium: 139 mmol/L (ref 135–146)
Total Bilirubin: 0.5 mg/dL (ref 0.2–1.2)
Total Protein: 6.5 g/dL (ref 6.1–8.1)

## 2017-06-13 LAB — LIPID PANEL
Cholesterol: 190 mg/dL (ref <200)
HDL: 86 mg/dL (ref >50)
LDL Cholesterol (Calc): 85 mg/dL (calc)
Non-HDL Cholesterol (Calc): 104 mg/dL (calc) (ref <130)
Total CHOL/HDL Ratio: 2.2 (calc) (ref <5.0)
Triglycerides: 96 mg/dL (ref <150)

## 2017-06-13 LAB — VITAMIN D 25 HYDROXY (VIT D DEFICIENCY, FRACTURES): Vit D, 25-Hydroxy: 66 ng/mL (ref 30–100)

## 2017-06-14 ENCOUNTER — Encounter: Payer: Self-pay | Admitting: *Deleted

## 2017-06-21 ENCOUNTER — Telehealth: Payer: Self-pay

## 2017-06-21 DIAGNOSIS — K4020 Bilateral inguinal hernia, without obstruction or gangrene, not specified as recurrent: Secondary | ICD-10-CM | POA: Diagnosis not present

## 2017-06-21 DIAGNOSIS — K219 Gastro-esophageal reflux disease without esophagitis: Secondary | ICD-10-CM | POA: Diagnosis not present

## 2017-06-21 DIAGNOSIS — I10 Essential (primary) hypertension: Secondary | ICD-10-CM | POA: Diagnosis not present

## 2017-06-21 NOTE — Telephone Encounter (Signed)
   [facility] Pre-operative Risk Assessment    Request for surgical clearance:  1. What type of surgery is being performed? Laparoscopic repair of bilateral inguinal hernias with mesh   2. When is this surgery scheduled? Pending Clearance    3. What type of clearance is required (medical clearance vs. Pharmacy clearance to hold med vs. Both)? Both clearance  4. Are there any medications that need to be held prior to surgery and how long? Plavix   5. Practice name and name of physician performing surgery? [facility] Dr Greer Pickerel   6. What is your office phone and fax number? Phone [phone] Fax [phone] Attn Jan   7. Anesthesia type (None, local, MAC, general) ? [place] 06/21/2017, 3:43 PM  _________________________________________________________________   (provider comments below)

## 2017-06-21 NOTE — Telephone Encounter (Signed)
   Primary Cardiologist: Quay Burow, MD  Chart reviewed as part of pre-operative protocol coverage. Patient was contacted 06/21/2017 in reference to pre-operative risk assessment for pending surgery as outlined below.  [name] was last seen on 05/18/16 by [name].  Since that day, [name] has done well. She can complete more than 4.0 METS.  Therefore, based on ACC/AHA guidelines, the patient would be at acceptable risk for the planned procedure without further cardiovascular testing.   I will route this recommendation to the requesting party via Epic fax function and remove from pre-op pool.  Per Epic, it appears that plavix was started by neurology: Dr. Krista Blue at [facility]. Requesting service should contact Dr. Krista Blue for guidance on plavix. Will also route to [name].  Please call with questions.  [place], [place] 06/21/2017, 4:09 PM

## 2017-06-22 NOTE — Telephone Encounter (Signed)
I left pt a message to check with Dr. Army Melia if she could stop plavix for surgery.

## 2017-06-22 NOTE — Telephone Encounter (Signed)
[name] is returning a call . Thanks

## 2017-06-25 ENCOUNTER — Telehealth: Payer: Self-pay | Admitting: Cardiovascular Disease

## 2017-06-25 ENCOUNTER — Telehealth: Payer: Self-pay

## 2017-06-25 NOTE — Telephone Encounter (Signed)
Okay to interrupt and psychotherapy for her mesh procedure.

## 2017-06-25 NOTE — Telephone Encounter (Signed)
Please clarify

## 2017-06-25 NOTE — Telephone Encounter (Signed)
Spoke with patient to let her know that she would have to have the doctor prescribing her Plavix address that for her upcoming procedure. She verbalized understanding.   Spoke with Jan at [facility] and let her know the patient had been cleared from a cardiac standpoint, however her Plavix would have to be addressed by the physician currently taking care of her Plavix prescriptions and gave her that information as per the patient. She verbalized understanding.

## 2017-06-25 NOTE — Telephone Encounter (Signed)
Patient called to request surgical clearance for 2 inguinal hernia operations (not scheduled yet)  Surgery to be performed by Dr.Eric Redmond Pulling with [facility].  Since PCP is prescribing Plavix clearance needed.  Patient already contacted Cardiology and was told to follow-up with Gayland Curry, DO  Last Appointment 06/07/17   Please advise

## 2017-06-25 NOTE — Telephone Encounter (Signed)
New Message    Patient is following up about surgical clearance , she states Dr Krista Blue did not give her the Plavix. On December 18th,2014 she went to the hospital for possible stroke, the hospital put her on the plavix Dr Delena Serve gave her a 30 day supply of the plavix and then Dr Art Carlota Raspberry continued to write and refill the prescription for her monthly.  Dr Jose Persia at [facility] just did testing on her blood and she is now filling the prescription for her.     Does she need to contact Dr Joneen Caraway for instructions on the Plavix?

## 2017-06-26 NOTE — Telephone Encounter (Signed)
Triage @ [facility] notified to call Neuro for Plavix directions

## 2017-06-26 NOTE — Telephone Encounter (Signed)
Antiplatelet therapy, not psychotherapy

## 2017-06-26 NOTE — Telephone Encounter (Signed)
Surgery had contacted me to find out when plavix should be held--3 days prior to surgery--I responded to that message a couple of minute ago. She is ok for the surgery from my perspective, await cardiology input since she's not had an echo for 5 years (also no new symptoms or problems related, however).

## 2017-06-26 NOTE — Telephone Encounter (Signed)
Faxed to requesting party via EPIC

## 2017-07-03 ENCOUNTER — Other Ambulatory Visit: Payer: Self-pay | Admitting: Internal Medicine

## 2017-07-03 DIAGNOSIS — G459 Transient cerebral ischemic attack, unspecified: Secondary | ICD-10-CM

## 2017-07-12 DIAGNOSIS — H6123 Impacted cerumen, bilateral: Secondary | ICD-10-CM | POA: Diagnosis not present

## 2017-07-12 DIAGNOSIS — J310 Chronic rhinitis: Secondary | ICD-10-CM | POA: Diagnosis not present

## 2017-07-25 ENCOUNTER — Ambulatory Visit: Payer: Self-pay | Admitting: General Surgery

## 2017-07-25 NOTE — Pre-Procedure Instructions (Signed)
[place]  07/25/2017      [address], [place] [address] Phone: [phone] Fax: [phone]    Your procedure is scheduled on Friday March 22.  Report to [facility] Admitting at 5:30 A.M.  Call this number if you have problems the morning of surgery:  [phone]   Remember:  Do not eat food or drink liquids after midnight.  **DRINK Ensure Pre-surgery drink prior to leaving home the morning of surgery**   Take these medicines the morning of surgery with A SIP OF WATER:   Metoprolol (Toprol XL) Ranitidine (Zantac) if needed Acetaminophen (tylenol) if needed EYE drops if needed  7 days prior to surgery STOP taking any Aspirin(unless otherwise instructed by your surgeon), Aleve, Naproxen, Ibuprofen, Motrin, Advil, Goody's, BC's, all herbal medications, fish oil, and all vitamins  **FOLLOW your surgeon's instructions on stopping Plavix. If no instructions were given, please call your surgeon's office**   Do not wear jewelry, make-up or nail polish.  Do not wear lotions, powders, or perfumes, or deodorant.  Do not shave 48 hours prior to surgery.  Men may shave face and neck.  Do not bring valuables to the hospital.  [facility] is not responsible for any belongings or valuables.  Contacts, dentures or bridgework may not be worn into surgery.  Leave your suitcase in the car.  After surgery it may be brought to your room.  For patients admitted to the hospital, discharge time will be determined by your treatment team.  Patients discharged the day of surgery will not be allowed to drive home.    Special instructions:    [facility]- Preparing For Surgery  Before surgery, you can play an important role. Because skin is not sterile, your skin needs to be as free of germs as possible. You can reduce the number of germs on your skin by washing with CHG  (chlorahexidine gluconate) Soap before surgery.  CHG is an antiseptic cleaner which kills germs and bonds with the skin to continue killing germs even after washing.  Please do not use if you have an allergy to CHG or antibacterial soaps. If your skin becomes reddened/irritated stop using the CHG.  Do not shave (including legs and underarms) for at least 48 hours prior to first CHG shower. It is OK to shave your face.  Please follow these instructions carefully.   1. Shower the NIGHT BEFORE SURGERY and the MORNING OF SURGERY with CHG.   2. If you chose to wash your hair, wash your hair first as usual with your normal shampoo.  3. After you shampoo, rinse your hair and body thoroughly to remove the shampoo.  4. Use CHG as you would any other liquid soap. You can apply CHG directly to the skin and wash gently with a scrungie or a clean washcloth.   5. Apply the CHG Soap to your body ONLY FROM THE NECK DOWN.  Do not use on open wounds or open sores. Avoid contact with your eyes, ears, mouth and genitals (private parts). Wash Face and genitals (private parts)  with your normal soap.  6. Wash thoroughly, paying special attention to the area where your surgery will be performed.  7. Thoroughly rinse your body with warm water from the neck down.  8. DO NOT shower/wash with your normal soap after using and rinsing off the CHG Soap.  9. Pat yourself  dry with a CLEAN TOWEL.  10. Wear CLEAN PAJAMAS to bed the night before surgery, wear comfortable clothes the morning of surgery  11. Place CLEAN SHEETS on your bed the night of your first shower and DO NOT SLEEP WITH PETS.    Day of Surgery: Do not apply any deodorants/lotions. Please wear clean clothes to the hospital/surgery center.      Please read over the following fact sheets that you were given. Coughing and Deep Breathing and Surgical Site Infection Prevention

## 2017-07-25 NOTE — H&P (Signed)
[name] Documented: 06/21/2017 1:23 PM Location: [facility] Patient #: [id number] DOB: 07/13/1928 Widowed / Language: [name] / Race: White Female   History of Present Illness Meredith Hiss [name] MD; 06/21/2017 2:03 PM) The patient is a 84 year old female who presents with an inguinal hernia. She comes in today accompanied by her daughter to discuss her known bilateral inguinal hernias. I initially met her in the emergency room on January 25 when she came in with an episode of incarceration of a right inguinal hernia. It is easily reducible at that time. We had an approximate 45 minutes a 50 minute discussion that evening regarding inguinal hernia, management and repair. She denies any additional episodes of incarceration since that event. The bulge is generally always they are especially when standing up. It will go back in at night. It does still bother her at times. However it has not caused her to double over in pain like it did that day that prompted her to come to the emergency room. She denies any chest pain, chest pressure, source of breath, chest tightness or angina. She denies any TIAs or amaurosis fugax. She hasn't really noticed anything on the contralateral side.   Problem List/Past Medical Meredith Hiss M. Redmond Pulling, MD; 06/21/2017 2:09 PM) NON-RECURRENT BILATERAL INGUINAL HERNIA WITHOUT OBSTRUCTION OR GANGRENE (K40.20)   Past Surgical History [name], [place]; 06/21/2017 1:30 PM) Appendectomy  Cataract Surgery  Bilateral. Foot Surgery  Left. Hysterectomy (not due to cancer) - Complete  Tonsillectomy   Diagnostic Studies History [name], CMA; 06/21/2017 1:30 PM) Colonoscopy  5-10 years ago Mammogram  within last year Pap Smear  >5 years ago  Allergies [name], CMA; 06/21/2017 1:31 PM) Sulfa Drugs  Penicillins  Erythromycin *DERMATOLOGICALS*  Macrodantin *URINARY ANTI-INFECTIVES*  Trimethoprim *ANTI-INFECTIVE AGENTS -  MISC.*  Latex Exam Gloves *MEDICAL DEVICES AND SUPPLIES*   Medication History [name], CMA; 06/21/2017 1:33 PM) Metoprolol Succinate ER (25MG Tablet ER 24HR, Oral) Active. Lumigan (0.01% Solution, Ophthalmic) Active. Eye Wash ([place]) (Ophthalmic) Active. Vitamin D3 (Oral) Specific strength unknown - Active. Ranitidine (Oral) Specific strength unknown - Active. Tylenol PM Extra Strength (500-25MG Tablet, Oral) Active. Clarinex (Oral) Specific strength unknown - Active. Medications Reconciled  Social History [name] Education officer, museum, CMA; 06/21/2017 1:30 PM) Alcohol use  Occasional alcohol use. Caffeine use  Carbonated beverages, Tea. No drug use  Tobacco use  Never smoker.  Family History [name], CMA; 06/21/2017 1:30 PM) Colon Cancer  Father. Depression  Father. Heart Disease  Mother, Son.  Pregnancy / Birth History [name], [place]; 06/21/2017 1:30 PM) Age at menarche  17 years. Age of menopause  <47 Gravida  2 Length (months) of breastfeeding  7-12 Maternal age  33-30 Para  2 Regular periods   Other Problems Meredith Hiss M. Redmond Pulling, MD; 06/21/2017 2:09 PM) Back Pain  Bladder Problems  Cerebrovascular Accident  Chest pain  Inguinal Hernia  Oophorectomy  Right. Gastroesophageal Reflux Disease  High blood pressure     Review of Systems ([name] CMA; 06/21/2017 1:30 PM) General Not Present- Appetite Loss, Chills, Fatigue, Fever, Night Sweats, Weight Gain and Weight Loss. Skin Present- Dryness. Not Present- Change in Wart/Mole, Hives, Jaundice, New Lesions, Non-Healing Wounds, Rash and Ulcer. HEENT Present- Wears glasses/contact lenses. Not Present- Earache, Hearing Loss, Hoarseness, Nose Bleed, Oral Ulcers, Ringing in the Ears, Seasonal Allergies, Sinus Pain, Sore Throat, Visual Disturbances and Yellow Eyes. Respiratory Not Present- Bloody sputum, Chronic Cough, Difficulty Breathing, Snoring and Wheezing. Breast  Not  Present- Breast Mass, Breast Pain, Nipple Discharge and Skin Changes. Cardiovascular Present- Swelling of Extremities. Not Present- Chest Pain, Difficulty Breathing Lying Down, Leg Cramps, Palpitations, Rapid Heart Rate and Shortness of Breath. Gastrointestinal Not Present- Abdominal Pain, Bloating, Bloody Stool, Change in Bowel Habits, Chronic diarrhea, Constipation, Difficulty Swallowing, Excessive gas, Gets full quickly at meals, Hemorrhoids, Indigestion, Nausea, Rectal Pain and Vomiting. Female Genitourinary Present- Frequency, Pelvic Pain and Urgency. Not Present- Nocturia and Painful Urination. Musculoskeletal Present- Back Pain. Not Present- Joint Pain, Joint Stiffness, Muscle Pain, Muscle Weakness and Swelling of Extremities. Neurological Not Present- Decreased Memory, Fainting, Headaches, Numbness, Seizures, Tingling, Tremor, Trouble walking and Weakness. Psychiatric Present- Change in Sleep Pattern. Not Present- Anxiety, Bipolar, Depression, Fearful and Frequent crying. Endocrine Not Present- Cold Intolerance, Excessive Hunger, Hair Changes, Heat Intolerance, Hot flashes and New Diabetes. Hematology Present- Blood Thinners and Easy Bruising. Not Present- Excessive bleeding, Gland problems, HIV and Persistent Infections.  Vitals ([name] CMA; 06/21/2017 1:34 PM) 06/21/2017 1:34 PM Weight: 124.38 lb Height: 62in Height was reported by patient. Body Surface Area: 1.56 m Body Mass Index: 22.75 kg/m  Temp.: 98.60F(Oral)  Pulse: 66 (Regular)  BP: 140/80 (Sitting, Left Arm, Standard)       Physical Exam Meredith Hiss [name] MD; 06/21/2017 2:05 PM) General Mental Status-Alert. General Appearance-Well groomed and Consistent with stated age. Build & Nutrition-Petite, Well nourished and Well developed. Gait -Note: walks with cane.  Hydration-Well hydrated. Health Status-Alive and well.  Integumentary Global Assessment Examination of related systems  reveals - Eyes are clear, with no discharge, lesions or obvious infection of the conjunctivae or lids. Upon inspection and palpation of skin surfaces of the - Head/Face: no rashes, ulcers, lesions or evidence of photo damage. No palpable nodules or masses and Neck: no visible lesions or palpable masses.  ENMT Ears -Note: normal external ears.  Mouth and Throat -Note: lips intact.   Abdomen Note: deferred   Neurologic Neurologic evaluation reveals -alert and oriented x 3 with no impairment of recent or remote memory and able to name objects and repeat phrases. Appropriate fund of knowledge . Motor-Normal.  Neuropsychiatric The patient's mood and affect are described as -normal. Associations-intact. Judgment and Insight-insight is appropriate concerning matters relevant to self, the patient displays appropriate judgment regarding every day activities and judgment is appropriate in social situations.    Assessment & Plan Meredith Hiss [name] MD; 06/21/2017 2:09 PM) NON-RECURRENT BILATERAL INGUINAL HERNIA WITHOUT OBSTRUCTION OR GANGRENE (K40.20) Impression: I had another long discussion with the patient and her daughter today regarding management of her inguinal hernias. She is definitely symptomatic on the right side and has had an episode of incarceration. I believe it is just a matter of time before she has another episode of incarceration. We discussed the pros and cons of elective repair versus emergent repair. Since she appears to be relatively healthy 84 year old and high functioning I recommended operative repair. We then discussed pros and cons of just repairing the symptomatic side or taking care of both sides.  We discussed the signs & symptoms of incarceration & strangulation. We discussed non-operative and operative management.  The patient has elected to proceed with laparoscopic repair of bilateral inguinal hernias with mesh  I described the procedure in detail.  The patient was given educational material. We discussed the risks and benefits including but not limited to bleeding, infection, chronic inguinal pain, nerve entrapment, hernia recurrence, mesh complications, hematoma formation, urinary retention, numbness in the groin, blood clots, injury to the surrounding structures,  and anesthesia risk. We also discussed the typical post operative recovery course, including no heavy lifting for 4-6 weeks, slow recovery, potential need for therapies (although unlikely). I explained that the likelihood of improvement of their symptoms is good  We explained that she will need cardiac clearance. If she is found to be moderate to high risk from a cardiovascular standpoint then I would recommend just open repair of the right groin at this point Current Plans Pt Education - Pamphlet Given - Laparoscopic Hernia Repair I recommended obtaining preoperative cardiac clearance. I am concerned about the health of the patient and the ability to tolerate the operation. Therefore, we will request clearance by cardiology to better assess operative risk & see if a reevaluation, further workup, etc is needed. Also recommendations on how medications such as for anticoagulation and blood pressure should be managed/held/restarted after surgery. HYPERTENSION, ESSENTIAL (I10) GASTROESOPHAGEAL REFLUX DISEASE, ESOPHAGITIS PRESENCE NOT SPECIFIED (K21.9)  Leighton Ruff. Redmond Pulling, MD, FACS General, Bariatric, & Minimally Invasive Surgery [facility], [place]

## 2017-07-26 ENCOUNTER — Encounter (HOSPITAL_COMMUNITY)
Admission: RE | Admit: 2017-07-26 | Discharge: 2017-07-26 | Disposition: A | Discharge status: Home / Self Care | Payer: Medicare Other | Source: Ambulatory Visit | Attending: General Surgery | Admitting: General Surgery

## 2017-07-26 ENCOUNTER — Encounter (HOSPITAL_COMMUNITY): Payer: Self-pay

## 2017-07-26 DIAGNOSIS — Z01818 Encounter for other preprocedural examination: Secondary | ICD-10-CM | POA: Diagnosis not present

## 2017-07-26 DIAGNOSIS — K4020 Bilateral inguinal hernia, without obstruction or gangrene, not specified as recurrent: Secondary | ICD-10-CM | POA: Insufficient documentation

## 2017-07-26 DIAGNOSIS — Z7902 Long term (current) use of antithrombotics/antiplatelets: Secondary | ICD-10-CM | POA: Diagnosis not present

## 2017-07-26 DIAGNOSIS — H409 Unspecified glaucoma: Secondary | ICD-10-CM | POA: Insufficient documentation

## 2017-07-26 DIAGNOSIS — K219 Gastro-esophageal reflux disease without esophagitis: Secondary | ICD-10-CM | POA: Diagnosis not present

## 2017-07-26 DIAGNOSIS — I10 Essential (primary) hypertension: Secondary | ICD-10-CM | POA: Insufficient documentation

## 2017-07-26 DIAGNOSIS — R002 Palpitations: Secondary | ICD-10-CM | POA: Insufficient documentation

## 2017-07-26 DIAGNOSIS — Z79899 Other long term (current) drug therapy: Secondary | ICD-10-CM | POA: Insufficient documentation

## 2017-07-26 DIAGNOSIS — M419 Scoliosis, unspecified: Secondary | ICD-10-CM | POA: Insufficient documentation

## 2017-07-26 DIAGNOSIS — E079 Disorder of thyroid, unspecified: Secondary | ICD-10-CM | POA: Insufficient documentation

## 2017-07-26 DIAGNOSIS — E785 Hyperlipidemia, unspecified: Secondary | ICD-10-CM | POA: Diagnosis not present

## 2017-07-26 HISTORY — DX: Gastro-esophageal reflux disease without esophagitis: K21.9

## 2017-07-26 LAB — CBC WITH DIFFERENTIAL/PLATELET
BASOS PCT: 0 %
Basophils Absolute: 0 10*3/uL (ref 0.0–0.1)
EOS ABS: 0.2 10*3/uL (ref 0.0–0.7)
Eosinophils Relative: 2 %
HCT: 37.7 % (ref 36.0–46.0)
Hemoglobin: 12.4 g/dL (ref 12.0–15.0)
LYMPHS ABS: 2.1 10*3/uL (ref 0.7–4.0)
Lymphocytes Relative: 29 %
MCH: 31 pg (ref 26.0–34.0)
MCHC: 32.9 g/dL (ref 30.0–36.0)
MCV: 94.3 fL (ref 78.0–100.0)
MONO ABS: 0.4 10*3/uL (ref 0.1–1.0)
MONOS PCT: 6 %
Neutro Abs: 4.6 10*3/uL (ref 1.7–7.7)
Neutrophils Relative %: 63 %
Platelets: 203 10*3/uL (ref 150–400)
RBC: 4 MIL/uL (ref 3.87–5.11)
RDW: 14.8 % (ref 11.5–15.5)
WBC: 7.3 10*3/uL (ref 4.0–10.5)

## 2017-07-26 LAB — COMPREHENSIVE METABOLIC PANEL
ALBUMIN: 3.7 g/dL (ref 3.5–5.0)
ALK PHOS: 52 U/L (ref 38–126)
ALT: 19 U/L (ref 14–54)
ANION GAP: 10 (ref 5–15)
AST: 24 U/L (ref 15–41)
BUN: 17 mg/dL (ref 6–20)
CALCIUM: 9 mg/dL (ref 8.9–10.3)
CO2: 25 mmol/L (ref 22–32)
Chloride: 103 mmol/L (ref 101–111)
Creatinine, Ser: 0.67 mg/dL (ref 0.44–1.00)
GFR calc non Af Amer: >60 mL/min (ref >60)
GLUCOSE: 88 mg/dL (ref 65–99)
POTASSIUM: 4.2 mmol/L (ref 3.5–5.1)
SODIUM: 138 mmol/L (ref 135–145)
Total Bilirubin: 0.5 mg/dL (ref 0.3–1.2)
Total Protein: 6.2 g/dL — ABNORMAL LOW (ref 6.5–8.1)

## 2017-07-26 MED ORDER — CHLORHEXIDINE GLUCONATE 4 % EX LIQD: 60.0000 mL | Freq: Once | CUTANEOUS | Status: DC

## 2017-07-27 NOTE — Progress Notes (Signed)
Anesthesia Chart Review:  Pt is an 84 year old female scheduled for laparoscopic B inguinal hernia repair, insertion of mesh on 08/03/2017 with Greer Pickerel, MD  - PCP is Hollace Kinnier, DO who is aware of upcoming surgery - Cardiologist is Quay Burow, MD. Pt cleared for surgery by Fabian Sharp, PA on 06/21/17  PMH includes:  HTN, palpitations, hyperlipidemia, hypothyroidism, scoliosis, glaucoma, GERD. Never smoker. BMI 24  Medications include: plavix, metoprolol, zantac. Pt to stop plavix 07/31/17  BP 132/62   Pulse 69   Temp 36.6 C   Resp 18   Ht 5' (1.524 m)   Wt 124 lb (56.2 kg)   SpO2 95%   BMI 24.22 kg/m   Preoperative labs reviewed.    EKG 04/17/17: NSR.  Possible LAE.  CT abdomen/pelvis 06/08/17:  1. Chronic small bowel containing right inguinal hernia with new large bowel containing left inguinal hernia. No obstruction is noted. A large amount of fecal retention is seen within the colon. No inflammatory change is noted. No definite evidence of incarceration. 2. Stable cardiomegaly.  No active pulmonary disease. 3. Low lying right pelvic kidney with chronic bilateral renal collecting system ectasia. No nephrolithiasis nor obstructive uropathy. 4. Sigmoid diverticulosis without acute diverticulitis. 5. Status post appendectomy and hysterectomy.  Echo 05/01/13:  - No cardiac source of embolism was identified, but cannot be ruled out on the basis of this examination. Recommendations: Consider transesophageal echocardiography if clinically indicated.   Carotid duplex 05/01/13:  - Bilateral - 1% to 39% ICA stenosis in the bulb lower end of range.  - Vertebral artery flow is antegrade.  If no changes, I anticipate pt can proceed with surgery as scheduled.   Willeen Cass, FNP-BC [facility]/Anesthesiology Phone: [phone] 07/27/2017 12:17 PM

## 2017-08-02 NOTE — Anesthesia Preprocedure Evaluation (Signed)
Anesthesia Evaluation  Patient identified by MRN, date of birth, ID band Patient awake    Reviewed: Allergy & Precautions, NPO status , Patient's Chart, lab work & pertinent test results  Airway Mallampati: II  TM Distance: >3 FB Neck ROM: Full    Dental no notable dental hx.    Pulmonary neg pulmonary ROS,    Pulmonary exam normal breath sounds clear to auscultation       Cardiovascular hypertension, Pt. on medications and Pt. on home beta blockers Normal cardiovascular exam Rhythm:Regular Rate:Normal  ECHO 14   Study Conclusions  Left ventricle: The cavity size was normal. Systolic function was normal. The estimated ejection fraction was in the range of 60% to 65%. Wall motion was normal; there were no regional wall motion abnormalities. Doppler parameters are consistent with abnormal left ventricular relaxation (grade 1 diastolic dysfunction).   Neuro/Psych TIAnegative psych ROS   GI/Hepatic Neg liver ROS, GERD  ,  Endo/Other  Hypothyroidism   Renal/GU negative Renal ROS  negative genitourinary   Musculoskeletal  (+) Arthritis , Osteoarthritis,    Abdominal   Peds negative pediatric ROS (+)  Hematology negative hematology ROS (+)   Anesthesia Other Findings   Reproductive/Obstetrics negative OB ROS                             Anesthesia Physical Anesthesia Plan  ASA: III  Anesthesia Plan: General   Post-op Pain Management:    Induction: Intravenous  PONV Risk Score and Plan: 3 and Ondansetron and Treatment may vary due to age or medical condition  Airway Management Planned: Oral ETT  Additional Equipment:   Intra-op Plan:   Post-operative Plan: Extubation in OR  Informed Consent: I have reviewed the patients History and Physical, chart, labs and discussed the procedure including the risks, benefits and alternatives for the proposed anesthesia with the patient or  authorized representative who has indicated his/her understanding and acceptance.     Plan Discussed with: CRNA and Anesthesiologist  Anesthesia Plan Comments: (  )        Anesthesia Quick Evaluation

## 2017-08-03 ENCOUNTER — Ambulatory Visit (HOSPITAL_COMMUNITY): Payer: Medicare Other | Admitting: Anesthesiology

## 2017-08-03 ENCOUNTER — Other Ambulatory Visit: Payer: Self-pay

## 2017-08-03 ENCOUNTER — Ambulatory Visit (HOSPITAL_COMMUNITY): Payer: Medicare Other | Admitting: Emergency Medicine

## 2017-08-03 ENCOUNTER — Encounter (HOSPITAL_COMMUNITY): Payer: Self-pay | Admitting: Anesthesiology

## 2017-08-03 ENCOUNTER — Observation Stay (HOSPITAL_COMMUNITY)
Admission: RE | Admit: 2017-08-03 | Discharge: 2017-08-04 | Disposition: A | Discharge status: Home / Self Care | Payer: Medicare Other | Source: Ambulatory Visit | Attending: General Surgery | Admitting: General Surgery

## 2017-08-03 ENCOUNTER — Encounter (HOSPITAL_COMMUNITY)
Admission: RE | Disposition: A | Discharge status: Home / Self Care | Payer: Self-pay | Source: Ambulatory Visit | Attending: General Surgery

## 2017-08-03 DIAGNOSIS — E039 Hypothyroidism, unspecified: Secondary | ICD-10-CM | POA: Insufficient documentation

## 2017-08-03 DIAGNOSIS — Z881 Allergy status to other antibiotic agents status: Secondary | ICD-10-CM | POA: Insufficient documentation

## 2017-08-03 DIAGNOSIS — Z882 Allergy status to sulfonamides status: Secondary | ICD-10-CM | POA: Diagnosis not present

## 2017-08-03 DIAGNOSIS — Z91040 Latex allergy status: Secondary | ICD-10-CM | POA: Insufficient documentation

## 2017-08-03 DIAGNOSIS — K4090 Unilateral inguinal hernia, without obstruction or gangrene, not specified as recurrent: Secondary | ICD-10-CM | POA: Diagnosis not present

## 2017-08-03 DIAGNOSIS — H409 Unspecified glaucoma: Secondary | ICD-10-CM | POA: Diagnosis not present

## 2017-08-03 DIAGNOSIS — M4120 Other idiopathic scoliosis, site unspecified: Secondary | ICD-10-CM | POA: Insufficient documentation

## 2017-08-03 DIAGNOSIS — Z8249 Family history of ischemic heart disease and other diseases of the circulatory system: Secondary | ICD-10-CM | POA: Diagnosis not present

## 2017-08-03 DIAGNOSIS — Z7902 Long term (current) use of antithrombotics/antiplatelets: Secondary | ICD-10-CM | POA: Diagnosis not present

## 2017-08-03 DIAGNOSIS — I10 Essential (primary) hypertension: Secondary | ICD-10-CM | POA: Diagnosis not present

## 2017-08-03 DIAGNOSIS — E559 Vitamin D deficiency, unspecified: Secondary | ICD-10-CM | POA: Insufficient documentation

## 2017-08-03 DIAGNOSIS — K219 Gastro-esophageal reflux disease without esophagitis: Secondary | ICD-10-CM | POA: Insufficient documentation

## 2017-08-03 DIAGNOSIS — K4020 Bilateral inguinal hernia, without obstruction or gangrene, not specified as recurrent: Principal | ICD-10-CM | POA: Insufficient documentation

## 2017-08-03 DIAGNOSIS — Z79899 Other long term (current) drug therapy: Secondary | ICD-10-CM | POA: Diagnosis not present

## 2017-08-03 DIAGNOSIS — Z888 Allergy status to other drugs, medicaments and biological substances status: Secondary | ICD-10-CM | POA: Diagnosis not present

## 2017-08-03 DIAGNOSIS — M810 Age-related osteoporosis without current pathological fracture: Secondary | ICD-10-CM | POA: Insufficient documentation

## 2017-08-03 DIAGNOSIS — G459 Transient cerebral ischemic attack, unspecified: Secondary | ICD-10-CM

## 2017-08-03 DIAGNOSIS — Z9889 Other specified postprocedural states: Secondary | ICD-10-CM

## 2017-08-03 DIAGNOSIS — M1990 Unspecified osteoarthritis, unspecified site: Secondary | ICD-10-CM | POA: Diagnosis not present

## 2017-08-03 DIAGNOSIS — Z880 Allergy status to penicillin: Secondary | ICD-10-CM | POA: Diagnosis not present

## 2017-08-03 DIAGNOSIS — E785 Hyperlipidemia, unspecified: Secondary | ICD-10-CM | POA: Insufficient documentation

## 2017-08-03 DIAGNOSIS — Z8719 Personal history of other diseases of the digestive system: Secondary | ICD-10-CM

## 2017-08-03 HISTORY — PX: INSERTION OF MESH: SHX5868

## 2017-08-03 HISTORY — PX: INGUINAL HERNIA REPAIR: SHX194

## 2017-08-03 LAB — CREATININE, SERUM
Creatinine, Ser: 0.73 mg/dL (ref 0.44–1.00)
GFR calc non Af Amer: >60 mL/min (ref >60)

## 2017-08-03 SURGERY — REPAIR, HERNIA, INGUINAL, LAPAROSCOPIC
Anesthesia: General | Site: Groin | Laterality: Right

## 2017-08-03 MED ORDER — LIDOCAINE HCL (CARDIAC) 20 MG/ML IV SOLN
INTRAVENOUS | Status: DC | PRN
  Administered 2017-08-03: 100 mg via INTRAVENOUS

## 2017-08-03 MED ORDER — ONDANSETRON 4 MG PO TBDP: 4.0000 mg | ORAL_TABLET | Freq: Four times a day (QID) | ORAL | Status: DC | PRN

## 2017-08-03 MED ORDER — 0.9 % SODIUM CHLORIDE (POUR BTL) OPTIME
TOPICAL | Status: DC | PRN
  Administered 2017-08-03: 1000 mL

## 2017-08-03 MED ORDER — PANTOPRAZOLE SODIUM 40 MG PO TBEC
40.0000 mg | DELAYED_RELEASE_TABLET | Freq: Every day | ORAL | Status: DC
  Administered 2017-08-04: 40 mg via ORAL
  Filled 2017-08-03: qty 1

## 2017-08-03 MED ORDER — SUGAMMADEX SODIUM 200 MG/2ML IV SOLN
INTRAVENOUS | Status: DC | PRN
  Administered 2017-08-03: 200 mg via INTRAVENOUS

## 2017-08-03 MED ORDER — ACETAMINOPHEN 500 MG PO TABS
ORAL_TABLET | ORAL | Status: AC
  Filled 2017-08-03: qty 1

## 2017-08-03 MED ORDER — ONDANSETRON HCL 4 MG/2ML IJ SOLN
INTRAMUSCULAR | Status: AC
  Filled 2017-08-03: qty 2

## 2017-08-03 MED ORDER — ROCURONIUM BROMIDE 100 MG/10ML IV SOLN
INTRAVENOUS | Status: DC | PRN
  Administered 2017-08-03: 40 mg via INTRAVENOUS
  Administered 2017-08-03: 20 mg via INTRAVENOUS

## 2017-08-03 MED ORDER — OXYCODONE HCL 5 MG PO TABS: 2.5000 mg | ORAL_TABLET | Freq: Four times a day (QID) | ORAL | Status: DC | PRN

## 2017-08-03 MED ORDER — DEXAMETHASONE SODIUM PHOSPHATE 10 MG/ML IJ SOLN
INTRAMUSCULAR | Status: DC | PRN
  Administered 2017-08-03: 10 mg via INTRAVENOUS

## 2017-08-03 MED ORDER — DEXAMETHASONE SODIUM PHOSPHATE 10 MG/ML IJ SOLN
INTRAMUSCULAR | Status: AC
  Filled 2017-08-03: qty 1

## 2017-08-03 MED ORDER — GABAPENTIN 100 MG PO CAPS
100.0000 mg | ORAL_CAPSULE | Freq: Two times a day (BID) | ORAL | Status: DC
  Administered 2017-08-03 – 2017-08-04 (×3): 100 mg via ORAL
  Filled 2017-08-03 (×3): qty 1

## 2017-08-03 MED ORDER — FENTANYL CITRATE (PF) 100 MCG/2ML IJ SOLN
INTRAMUSCULAR | Status: DC | PRN
  Administered 2017-08-03 (×2): 50 ug via INTRAVENOUS

## 2017-08-03 MED ORDER — ACETAMINOPHEN 325 MG PO TABS
650.0000 mg | ORAL_TABLET | Freq: Four times a day (QID) | ORAL | Status: DC
  Administered 2017-08-03 – 2017-08-04 (×4): 650 mg via ORAL
  Filled 2017-08-03 (×4): qty 2

## 2017-08-03 MED ORDER — SUGAMMADEX SODIUM 200 MG/2ML IV SOLN
INTRAVENOUS | Status: AC
  Filled 2017-08-03: qty 2

## 2017-08-03 MED ORDER — DIPHENHYDRAMINE HCL 50 MG/ML IJ SOLN: 12.5000 mg | Freq: Four times a day (QID) | INTRAMUSCULAR | Status: DC | PRN

## 2017-08-03 MED ORDER — EPHEDRINE SULFATE 50 MG/ML IJ SOLN
INTRAMUSCULAR | Status: DC | PRN
  Administered 2017-08-03 (×2): 10 mg via INTRAVENOUS

## 2017-08-03 MED ORDER — BUPIVACAINE-EPINEPHRINE 0.25% -1:200000 IJ SOLN
INTRAMUSCULAR | Status: DC | PRN
  Administered 2017-08-03: 30 mL

## 2017-08-03 MED ORDER — LACTATED RINGERS IV SOLN
INTRAVENOUS | Status: DC | PRN
  Administered 2017-08-03: 07:00:00 via INTRAVENOUS

## 2017-08-03 MED ORDER — POTASSIUM CHLORIDE IN NACL 20-0.45 MEQ/L-% IV SOLN
INTRAVENOUS | Status: DC
  Administered 2017-08-03: 16:00:00 via INTRAVENOUS
  Filled 2017-08-03: qty 1000

## 2017-08-03 MED ORDER — MORPHINE SULFATE (PF) 4 MG/ML IV SOLN: 1.0000 mg | INTRAVENOUS | Status: DC | PRN

## 2017-08-03 MED ORDER — SIMETHICONE 80 MG PO CHEW: 40.0000 mg | CHEWABLE_TABLET | Freq: Four times a day (QID) | ORAL | Status: DC | PRN

## 2017-08-03 MED ORDER — ROCURONIUM BROMIDE 10 MG/ML (PF) SYRINGE
PREFILLED_SYRINGE | INTRAVENOUS | Status: AC
  Filled 2017-08-03: qty 5

## 2017-08-03 MED ORDER — FENTANYL CITRATE (PF) 250 MCG/5ML IJ SOLN
INTRAMUSCULAR | Status: AC
  Filled 2017-08-03: qty 5

## 2017-08-03 MED ORDER — LIDOCAINE HCL (CARDIAC) 20 MG/ML IV SOLN
INTRAVENOUS | Status: AC
  Filled 2017-08-03: qty 5

## 2017-08-03 MED ORDER — METOPROLOL SUCCINATE ER 25 MG PO TB24
25.0000 mg | ORAL_TABLET | Freq: Every day | ORAL | Status: DC
  Administered 2017-08-04: 25 mg via ORAL
  Filled 2017-08-03: qty 1

## 2017-08-03 MED ORDER — ONDANSETRON HCL 4 MG/2ML IJ SOLN: 4.0000 mg | Freq: Four times a day (QID) | INTRAMUSCULAR | Status: DC | PRN

## 2017-08-03 MED ORDER — DOCUSATE SODIUM 100 MG PO CAPS
100.0000 mg | ORAL_CAPSULE | Freq: Two times a day (BID) | ORAL | Status: DC
  Administered 2017-08-03 – 2017-08-04 (×2): 100 mg via ORAL
  Filled 2017-08-03 (×2): qty 1

## 2017-08-03 MED ORDER — GABAPENTIN 300 MG PO CAPS
ORAL_CAPSULE | ORAL | Status: AC
  Administered 2017-08-03: 200 mg via ORAL
  Filled 2017-08-03: qty 1

## 2017-08-03 MED ORDER — VANCOMYCIN HCL 1000 MG IV SOLR
INTRAVENOUS | Status: DC | PRN
  Administered 2017-08-03: 1000 mg via INTRAVENOUS

## 2017-08-03 MED ORDER — BUPIVACAINE LIPOSOME 1.3 % IJ SUSP
20.0000 mL | INTRAMUSCULAR | Status: AC
  Administered 2017-08-03: 20 mL
  Filled 2017-08-03: qty 20

## 2017-08-03 MED ORDER — PROPOFOL 10 MG/ML IV BOLUS
INTRAVENOUS | Status: DC | PRN
  Administered 2017-08-03: 80 mg via INTRAVENOUS
  Administered 2017-08-03: 20 mg via INTRAVENOUS

## 2017-08-03 MED ORDER — DIPHENHYDRAMINE HCL 12.5 MG/5ML PO ELIX: 12.5000 mg | ORAL_SOLUTION | Freq: Four times a day (QID) | ORAL | Status: DC | PRN

## 2017-08-03 MED ORDER — ONDANSETRON HCL 4 MG/2ML IJ SOLN
INTRAMUSCULAR | Status: DC | PRN
  Administered 2017-08-03: 4 mg via INTRAVENOUS

## 2017-08-03 MED ORDER — BUPIVACAINE LIPOSOME 1.3 % IJ SUSP
20.0000 mL | Freq: Once | INTRAMUSCULAR | Status: DC
  Filled 2017-08-03: qty 20

## 2017-08-03 MED ORDER — MEPERIDINE HCL 50 MG/ML IJ SOLN: 6.2500 mg | INTRAMUSCULAR | Status: DC | PRN

## 2017-08-03 MED ORDER — ACETAMINOPHEN 500 MG PO TABS
1000.0000 mg | ORAL_TABLET | ORAL | Status: AC
  Administered 2017-08-03: 1000 mg via ORAL
  Filled 2017-08-03: qty 2

## 2017-08-03 MED ORDER — FENTANYL CITRATE (PF) 100 MCG/2ML IJ SOLN: 25.0000 ug | INTRAMUSCULAR | Status: DC | PRN

## 2017-08-03 MED ORDER — PROPOFOL 10 MG/ML IV BOLUS
INTRAVENOUS | Status: AC
  Filled 2017-08-03: qty 20

## 2017-08-03 MED ORDER — VANCOMYCIN HCL IN DEXTROSE 1-5 GM/200ML-% IV SOLN
1000.0000 mg | INTRAVENOUS | Status: DC
  Filled 2017-08-03: qty 200

## 2017-08-03 MED ORDER — BUPIVACAINE-EPINEPHRINE (PF) 0.25% -1:200000 IJ SOLN
INTRAMUSCULAR | Status: AC
  Filled 2017-08-03: qty 30

## 2017-08-03 MED ORDER — HEPARIN SODIUM (PORCINE) 5000 UNIT/ML IJ SOLN
5000.0000 [IU] | Freq: Three times a day (TID) | INTRAMUSCULAR | Status: DC
  Administered 2017-08-03 – 2017-08-04 (×2): 5000 [IU] via SUBCUTANEOUS
  Filled 2017-08-03 (×4): qty 1

## 2017-08-03 MED ORDER — LATANOPROST 0.005 % OP SOLN
1.0000 [drp] | Freq: Every day | OPHTHALMIC | Status: DC
  Administered 2017-08-03: 1 [drp] via OPHTHALMIC
  Filled 2017-08-03: qty 2.5

## 2017-08-03 MED ORDER — PHENYLEPHRINE HCL 10 MG/ML IJ SOLN
INTRAMUSCULAR | Status: DC | PRN
  Administered 2017-08-03: 25 ug/min via INTRAVENOUS

## 2017-08-03 MED ORDER — EPHEDRINE 5 MG/ML INJ
INTRAVENOUS | Status: AC
  Filled 2017-08-03: qty 10

## 2017-08-03 MED ORDER — GABAPENTIN 100 MG PO CAPS
200.0000 mg | ORAL_CAPSULE | ORAL | Status: AC
  Administered 2017-08-03: 200 mg via ORAL
  Filled 2017-08-03 (×2): qty 2

## 2017-08-03 SURGICAL SUPPLY — 41 items
BLADE SURG 10 STRL SS (BLADE) ×6 IMPLANT
CHLORAPREP W/TINT 26ML (MISCELLANEOUS) ×6 IMPLANT
COVER SURGICAL LIGHT HANDLE (MISCELLANEOUS) ×6 IMPLANT
DERMABOND ADVANCED (GAUZE/BANDAGES/DRESSINGS) ×2
DERMABOND ADVANCED .7 DNX12 (GAUZE/BANDAGES/DRESSINGS) ×4 IMPLANT
DEVICE SECURE STRAP 25 ABSORB (INSTRUMENTS) ×6 IMPLANT
ELECT CAUTERY BLADE 6.4 (BLADE) ×6 IMPLANT
ELECT REM PT RETURN 9FT ADLT (ELECTROSURGICAL) ×6
ELECTRODE REM PT RTRN 9FT ADLT (ELECTROSURGICAL) ×4 IMPLANT
GLOVE BIOGEL PI IND STRL 8 (GLOVE) ×4 IMPLANT
GLOVE BIOGEL PI INDICATOR 8 (GLOVE) ×2
GLOVE SURG SS PI 7.0 STRL IVOR (GLOVE) ×6 IMPLANT
GLOVE SURG SS PI 7.5 STRL IVOR (GLOVE) ×6 IMPLANT
GLOVE SURG SS PI 8.0 STRL IVOR (GLOVE) ×6 IMPLANT
GOWN STRL REUS W/ TWL LRG LVL3 (GOWN DISPOSABLE) ×4 IMPLANT
GOWN STRL REUS W/ TWL XL LVL3 (GOWN DISPOSABLE) ×4 IMPLANT
GOWN STRL REUS W/TWL 2XL LVL3 (GOWN DISPOSABLE) ×6 IMPLANT
GOWN STRL REUS W/TWL LRG LVL3 (GOWN DISPOSABLE) ×2
GOWN STRL REUS W/TWL XL LVL3 (GOWN DISPOSABLE) ×2
KIT BASIN OR (CUSTOM PROCEDURE TRAY) ×6 IMPLANT
KIT TURNOVER KIT B (KITS) ×6 IMPLANT
MESH 3DMAX 4X6 LT LRG (Mesh General) ×6 IMPLANT
MESH ULTRAPRO 3X6 7.6X15CM (Mesh General) ×6 IMPLANT
NS IRRIG 1000ML POUR BTL (IV SOLUTION) ×6 IMPLANT
PAD ARMBOARD 7.5X6 YLW CONV (MISCELLANEOUS) ×12 IMPLANT
PENCIL BUTTON HOLSTER BLD 10FT (ELECTRODE) ×6 IMPLANT
SCISSORS LAP 5X35 DISP (ENDOMECHANICALS) ×6 IMPLANT
SUT MNCRL AB 4-0 PS2 18 (SUTURE) ×12 IMPLANT
SUT PROLENE 2 0 CT2 30 (SUTURE) ×18 IMPLANT
SUT VIC AB 2-0 SH 27 (SUTURE) ×2
SUT VIC AB 2-0 SH 27X BRD (SUTURE) ×4 IMPLANT
SUT VIC AB 3-0 SH 18 (SUTURE) ×6 IMPLANT
TOWEL GREEN STERILE (TOWEL DISPOSABLE) ×6 IMPLANT
TRAY LAPAROSCOPIC MC (CUSTOM PROCEDURE TRAY) ×6 IMPLANT
TROCAR XCEL BLADELESS 5X75MML (TROCAR) ×12 IMPLANT
TROCAR XCEL BLUNT TIP 100MML (ENDOMECHANICALS) ×6 IMPLANT
TUBE CONNECTING 12'X1/4 (SUCTIONS) ×1
TUBE CONNECTING 12X1/4 (SUCTIONS) ×5 IMPLANT
TUBING INSUFFLATION (TUBING) ×6 IMPLANT
WATER STERILE IRR 1000ML POUR (IV SOLUTION) ×6 IMPLANT
YANKAUER SUCT BULB TIP NO VENT (SUCTIONS) ×6 IMPLANT

## 2017-08-03 NOTE — Anesthesia Postprocedure Evaluation (Signed)
Anesthesia Post Note  Patient: Superior  Procedure(s) Performed: LAPAROSCOPIC LEFT INGUINAL HERNIA REPAIR WITH MESH (Left Groin) HERNIA REPAIR RIGHT INGUINAL ADULT WITH MESH (Right Groin) INSERTION OF MESH (Bilateral Groin)     Patient location during evaluation: PACU Anesthesia Type: General Level of consciousness: awake and alert Pain management: pain level controlled Vital Signs Assessment: post-procedure vital signs reviewed and stable Respiratory status: spontaneous breathing, nonlabored ventilation, respiratory function stable and patient connected to nasal cannula oxygen Cardiovascular status: blood pressure returned to baseline and stable Postop Assessment: no apparent nausea or vomiting Anesthetic complications: no    Last Vitals:  Vitals Value Taken Time  BP 123/58 08/03/2017 12:36 PM  Temp    Pulse 61 08/03/2017 12:40 PM  Resp 18 08/03/2017 12:40 PM  SpO2 94 % 08/03/2017 12:40 PM  Vitals shown include unvalidated device data.  Last Pain:  Vitals:   08/03/17 1050  TempSrc:   PainSc: 0-No pain                 Silena Wyss

## 2017-08-03 NOTE — Anesthesia Procedure Notes (Signed)
Procedure Name: Intubation Date/Time: 08/03/2017 7:37 AM Performed by: Purvis Kilts, CRNA Pre-anesthesia Checklist: Patient identified, Emergency Drugs available, Suction available, Patient being monitored and Timeout performed Patient Re-evaluated:Patient Re-evaluated prior to induction Oxygen Delivery Method: Circle system utilized Preoxygenation: Pre-oxygenation with 100% oxygen Induction Type: IV induction Ventilation: Mask ventilation without difficulty Laryngoscope Size: Mac and 3 Grade View: Grade I Tube type: Oral Tube size: 7.0 mm Number of attempts: 1 Airway Equipment and Method: Stylet Placement Confirmation: ETT inserted through vocal cords under direct vision,  positive ETCO2 and breath sounds checked- equal and bilateral Secured at: 21 cm Tube secured with: Tape Dental Injury: Teeth and Oropharynx as per pre-operative assessment

## 2017-08-03 NOTE — H&P (Signed)
[name] [name] is an 84 y.o. [name].   Chief Complaint: here for surgery HPI: 84 yo preseents for elective lap repair of bilateral inguinal hernia repair with mesh. No changes since office visit.   The patient is a 85 year old [name] who presents with an inguinal hernia. [name] comes in today accompanied by [name] daughter to discuss [name] known bilateral inguinal hernias. I initially met [name] in the emergency room on January 25 when [name] came in with an episode of incarceration of a right inguinal hernia. It is easily reducible at that time. We had an approximate 45 minutes a 50 minute discussion that evening regarding inguinal hernia, management and repair. [name] denies any additional episodes of incarceration since that event. The bulge is generally always they are especially when standing up. It will go back in at night. It does still bother [name] at times. However it has not caused [name] to double over in pain like it did that day that prompted [name] to come to the emergency room. [name] denies any chest pain, chest pressure, source of breath, chest tightness or angina. [name] denies any TIAs or amaurosis fugax. [name] hasn't really noticed anything on the contralateral side.    Past Medical History:  Diagnosis Date  . Cervicalgia   . Chest pain at rest 01/07/2012  . Displacement of cervical intervertebral disc without myelopathy   . Diverticulosis of colon (without mention of hemorrhage)   . [name] stress incontinence   . GERD (gastroesophageal reflux disease)   . Inguinal hernia without mention of obstruction or gangrene, unilateral or unspecified, (not specified as recurrent)   . Internal hemorrhoids without mention of complication   . Lumbago   . Macular degeneration (senile) of retina, unspecified   . Osteoarthrosis, unspecified whether generalized or localized, unspecified site   . Other and unspecified hyperlipidemia   . Pain in joint, hand   . Palpitations   . Reflux esophagitis   .  Scoliosis (and kyphoscoliosis), idiopathic   . Senile osteoporosis   . Skin disorder   . Thyroid disease   . Unruptured popliteal cyst 06/24/2014   Right knee   . Unspecified essential hypertension   . Unspecified glaucoma(365.9)   . Unspecified hypothyroidism   . Unspecified vitamin D deficiency     Past Surgical History:  Procedure Laterality Date  . ABDOMINAL HYSTERECTOMY  1975   Dr Mallie Mussel  . APPENDECTOMY  1988  . cardiolite myocardial perfusion study    . DOPPLER ECHOCARDIOGRAPHY    . EYE SURGERY Bilateral 2009   cataract removed right eye, Dr Charise Killian  . FOOT SURGERY  august 2013   Doran Durand, MD  . NM MYOVIEW LTD     negative  . TONSILLECTOMY  1937  . TOOTH EXTRACTION  09/16/13   Dr Carlos American    Family History  Problem Relation Age of Onset  . CAD Mother   . Parkinson's disease Mother   . Cancer Father        colon cancer  . Parkinson's disease Father   . Stroke Maternal Grandmother    Social History:  reports that [name] has never smoked. [name] has never used smokeless tobacco. [name] reports that [name] does not drink alcohol or use drugs.  Allergies:  Allergies  Allergen Reactions  . Trimethoprim Other (See Comments)    Headaches/ ear pressure   . Erythromycin     UNSPECIFIED REACTION   . Macrodantin [Nitrofurantoin Macrocrystal]     UNSPECIFIED REACTION   . Penicillins  UNSPECIFIED REACTION  Has patient had a PCN reaction causing immediate rash, facial/tongue/throat swelling, SOB or lightheadedness with hypotension: Unknown Has patient had a PCN reaction causing severe rash involving mucus membranes or skin necrosis: Unknown Has patient had a PCN reaction that required hospitalization: No Has patient had a PCN reaction occurring within the last 10 years: No If all of the above answers are "NO", then may proceed with Cephalosporin use.  . Sulfa Antibiotics     UNSPECIFIED REACTION   . Latex Itching    Medications Prior to Admission  Medication Sig Dispense Refill   . acetaminophen (TYLENOL) 500 MG tablet Take 500 mg by mouth 2 (two) times daily as needed for mild pain or headache.     . bimatoprost (LUMIGAN) 0.01 % SOLN Place 1 drop into both eyes at bedtime.    . calcium elemental as carbonate (TUMS ULTRA 1000) 400 MG chewable tablet Chew 500 mg by mouth daily with lunch.    . Cholecalciferol (VITAMIN D3) 2000 UNITS TABS Take 2,000 Units by mouth 2 (two) times daily.     . clopidogrel (PLAVIX) 75 MG tablet TAKE 1 TABLET ONCE A DAY TO PREVENT STROKE. 90 tablet 1  . hydrocortisone cream 1 % Apply 1 application topically as needed for itching.    . metoprolol succinate (TOPROL-XL) 25 MG 24 hr tablet TAKE 1 TABLET ONCE TO REGULATE HEART AND CONTROL BLOOD PRESSURE. 90 tablet 0  . Multiple Vitamins-Minerals (PRESERVISION AREDS 2 PO) Take 1 tablet by mouth 2 (two) times daily.     Vladimir Faster Glycol-Propyl Glycol (SYSTANE ULTRA OP) Place 1 vial into both eyes 2 (two) times daily. May use another dose during the day as needed for dry eye    . ranitidine (ZANTAC) 150 MG tablet Take 150 mg by mouth daily as needed for heartburn.     . bacitracin-polymyxin b (POLYSPORIN) ointment Apply 1 application topically daily as needed (wound care).      No results found for this or any previous visit (from the past 48 hour(s)). No results found.  Review of Systems  All other systems reviewed and are negative.   Blood pressure (!) 163/60, pulse 61, temperature 98.4 F (36.9 C), temperature source Oral, resp. rate 20, height 5' (1.524 m), weight 52.6 kg (116 lb), SpO2 98 %. Physical Exam  Vitals reviewed. Constitutional: [name] is oriented to person, place, and time. [name] appears well-developed and well-nourished. No distress.  HENT:  Head: Normocephalic and atraumatic.  Right Ear: External ear normal.  Left Ear: External ear normal.  Eyes: Conjunctivae are normal. No scleral icterus.  Neck: Normal range of motion. Neck supple. No tracheal deviation present. No  thyromegaly present.  Cardiovascular: Normal rate and normal heart sounds.  Respiratory: Effort normal and breath sounds normal. No stridor. No respiratory distress. [name] has no wheezes.  GI: Soft. [name] exhibits no distension. There is no tenderness. There is no rebound.  Musculoskeletal: [name] exhibits no edema or tenderness.  Lymphadenopathy:    [name] has no cervical adenopathy.  Neurological: [name] is alert and oriented to person, place, and time. [name] exhibits normal muscle tone.  Skin: Skin is warm and dry. No rash noted. [name] is not diaphoretic. No erythema. No pallor.  Psychiatric: [name] has a normal mood and affect. [name] behavior is normal. Judgment and thought content normal.     Assessment/Plan B/l inguinal hernia HTN gerd OA Antiplatelet agent - stopped Tuesday  To OR for lap repair of b/l inguinal hernia repair  with mesh All questions asked/answered preop ERAS meds given.  Greer Pickerel, MD 08/03/2017, 7:19 AM

## 2017-08-03 NOTE — Op Note (Signed)
08/03/2017  [name] [name] [birth year]   PREOPERATIVE DIAGNOSIS: bilateral inguinal hernia.   POSTOPERATIVE DIAGNOSIS: bilateral indirect inguinal hernias  PROCEDURE: Laparoscopic repair of Left indirect inguinal hernia with  mesh (TAPP); open repair of right inguinal hernia with mesh  SURGEON: Leighton Ruff. Redmond Pulling, MD FACS  ASSISTANT SURGEON: None.   ANESTHESIA: General plus local consisting of 0.25% Marcaine with epi mixed with exparel  ESTIMATED BLOOD LOSS: Minimal.   FINDINGS: The patient had a bilateral indirect inguinal hernias.  She had a small section of sigmoid colon telescoping into the left indirect inguinal hernia.  On the right, she had an indirect hernia.  However she had fairly extensive adhesions of the cecum to the right lower quadrant abdominal wall.  I felt that the risk of enterotomy and violation of the peritoneal flap would be too high therefore I converted to open for repair of the right side.  SPECIMEN: Preperitoneal fat which was discarded  INDICATIONS FOR PROCEDURE: 84 year old female originally presented to the emergency room with a large symptomatic right inguinal hernia.  CT scan demonstrated bilateral inguinal hernias containing colon.  Both hernias were reducible in the emergency room.  She elected for outpatient follow-up.  I had extensive conversations on 2 different occasions with the patient and her daughter regarding observation versus repair as well as approach to repair.  Please see outside chart for additional details. The risks and benefits including but not limited to bleeding, infection, chronic inguinal pain, nerve entrapment, hernia recurrence, mesh complications, hematoma formation, urinary retention, injury to surrounding structures, numbness in the groin, blood clots, injury to the surrounding structures, and anesthesia risk was discussed with the patient.  DESCRIPTION OF PROCEDURE: After obtaining verbal consent and marking   the patient was  then taken back to the operating room, placed  supine on the operating room table. General endotracheal anesthesia was  established. The patient had emptied their bladder prior to going back to  the operating room. Sequential compression devices were placed. The  abdomen and groin were prepped and draped in the usual standard surgical  fashion with ChloraPrep. The patient received oral Tylenol and gabapentin as well as IV  antibiotics prior to the incision. A surgical time-out was performed.  Local was infiltrated at the base of the umbilicus.   Next, a 1-cm vertical infraumbilical incision was made with a #11 blade. The fascia  was grasped and lifted anteriorly. Next, the fascia was incised, and  the abdominal cavity was entered. Pursestring suture was placed around  the fascial edges using a 0 Vicryl. A 12-mm Hasson trocar was placed.  Pneumoperitoneum was smoothly established up to a patient pressure of 15  mmHg. Laparoscope was advanced. There was  evidence of bilateral indirect inguinal hernias.  There was a small section of sigmoid colon telescoping into the left indirect space.  In the right lower quadrant the cecum was fairly densely adhered to the right anterior abdominal wall.  I felt that given how dense it appeared that risk of enterotomy as well as violation of the peritoneal flap would be too high so I made a decision at that point that the right side would be repaired open. The patient had a defect lateral to  the inferior epigastric vessel, consistent with a bilateral indirect inguinal hernias  Two 5-mm trocars were placed, one on the right, one on the left  in the midclavicular line slightly above the level of the umbilicus all  under direct visualization. After local had been  infiltrated, I then  made incision along the peritoneum on the left, starting 2 inches above  the anterior superior iliac spine and caring it medial  toward the median umbilical ligament in a lazy S  configuration using  Endo Shears with electrocautery. The peritoneal flap was then gently  dissected downward from the anterior abdominal wall taking care not to  injure the inferior epigastric vessels. The pubic bone was identified.  The round ligament was identified.  Using  traction and counter traction with short graspers, I reduced the sac in  its entirety. The round ligament was ligated with cautery and divided.  I then went about creating a large pocket by  lifting the peritoneum of the pelvic floor. I took great care not to  injure the iliac vessels.  Exparel anesthetic was injected 2 finger breadths below and medial to the anterior superior iliac spine as well as along the left groin prior to placing the mesh. I then obtained a large piece of Bard 3dmax mesh, placed it through the Hasson trocar, half of it covered medial  to the inferior epigastric vessels and half of it lateral to the  inferior epigastric vessels. The defect was well  covered with the mesh. I then secured the mesh to the abdominal wall  using an Ethicon secure strap tack. Four Tacks were placed through  the Cooper's ligament, one tack on each side of the inferior epigastric  vessel and 1 tacks out laterally. No tacks were placed below the  shelving edge of the inguinal ligament. Pneumoperitoneum was reduced  to 8 mmHg. I then brought the peritoneal flap back up to the abdominal  wall and tacked it to the abdominal wall using 4 tacks. There was no  defect in the peritoneum, and the mesh was well covered. I removed the  Hasson trocar and tied down the previously placed pursestring suture.  The closure was viewed laparoscopically. There was no evidence of  fascial defect. There was no air leak at the umbilicus. There was no  evidence of injury to surrounding structures. Pneumoperitoneum was  released, and the remaining trocars were removed.   an oblique incision was made in the right inguinal space.  She had had a  prior open appendectomy. Dissection was carried down through the subcutaneous tissue with cautery to the external oblique fascia.  We opened the external oblique fascia along the direction of its fibers to the external ring.  The round ligament was circumferentially dissected bluntly and retracted with my finger.  The floor of the inguinal canal was inspected and while it was weak there was no defect.  I skeletonized the hernia sac and reduced it into the abdominal cavity.  We used a 3 x 6 inch piece of Ultrapro mesh.  This was secured with 2-0 Prolene, beginning at the pubic tubercle, running this along the shelving edge inferiorly. Superiorly, the mesh was secured to the internal oblique fascia with interrupted 2-0 Prolene sutures.   The mesh was tucked underneath the external oblique fascia laterally. Exparel was infiltrated in a regional fashion.   The external oblique fascia was reapproximated with 2-0 Vicryl.  3-0 Vicryl was used to close the subcutaneous tissues. All skin incisions were closed with a 4-0 Monocryl in a subcuticular fashion followed by  application of Dermabond. All needle, instrument, and sponge counts  were correct x2. There are no immediate complications. The patient  tolerated the procedure well. The patient was extubated and taken to the  recovery room  in stable condition.  Leighton Ruff. Redmond Pulling, MD, FACS General, Bariatric, & Minimally Invasive Surgery [facility], [place]

## 2017-08-03 NOTE — Care Management Obs Status (Signed)
[employer] NOTIFICATION   Patient Details  Name: [name] MRN: [id number] Date of Birth: Jun 09, 1928   Medicare Observation Status Notification Given:  Yes    Carles Collet, RN 08/03/2017, 4:19 PM

## 2017-08-03 NOTE — Transfer of Care (Signed)
Immediate Anesthesia Transfer of Care Note  Patient: [name]  Procedure(s) Performed: LAPAROSCOPIC LEFT INGUINAL HERNIA REPAIR WITH MESH (Left Groin) HERNIA REPAIR RIGHT INGUINAL ADULT WITH MESH (Right Groin) INSERTION OF MESH (Bilateral Groin)  Patient Location: PACU  Anesthesia Type:General  Level of Consciousness: awake  Airway & Oxygen Therapy: Patient Spontanous Breathing  Post-op Assessment: Report given to RN and Post -op Vital signs reviewed and stable  Post vital signs: Reviewed and stable  Last Vitals:  Vitals Value Taken Time  BP 110/79 08/03/2017  9:51 AM  Temp    Pulse 71 08/03/2017  9:55 AM  Resp 13 08/03/2017  9:55 AM  SpO2 95 % 08/03/2017  9:55 AM  Vitals shown include unvalidated device data.  Last Pain:  Vitals:   08/03/17 0658  TempSrc:   PainSc: 0-No pain      Patients Stated Pain Goal: 2 (82/80/03 4917)  Complications: No apparent anesthesia complications

## 2017-08-04 DIAGNOSIS — K4020 Bilateral inguinal hernia, without obstruction or gangrene, not specified as recurrent: Secondary | ICD-10-CM | POA: Diagnosis not present

## 2017-08-04 DIAGNOSIS — K219 Gastro-esophageal reflux disease without esophagitis: Secondary | ICD-10-CM | POA: Diagnosis not present

## 2017-08-04 DIAGNOSIS — I10 Essential (primary) hypertension: Secondary | ICD-10-CM | POA: Diagnosis not present

## 2017-08-04 DIAGNOSIS — E559 Vitamin D deficiency, unspecified: Secondary | ICD-10-CM | POA: Diagnosis not present

## 2017-08-04 DIAGNOSIS — E785 Hyperlipidemia, unspecified: Secondary | ICD-10-CM | POA: Diagnosis not present

## 2017-08-04 DIAGNOSIS — E039 Hypothyroidism, unspecified: Secondary | ICD-10-CM | POA: Diagnosis not present

## 2017-08-04 LAB — BASIC METABOLIC PANEL
Anion gap: 11 (ref 5–15)
BUN: 12 mg/dL (ref 6–20)
CO2: 24 mmol/L (ref 22–32)
CREATININE: 0.7 mg/dL (ref 0.44–1.00)
Calcium: 8.5 mg/dL — ABNORMAL LOW (ref 8.9–10.3)
Chloride: 99 mmol/L — ABNORMAL LOW (ref 101–111)
GFR calc non Af Amer: >60 mL/min (ref >60)
Glucose, Bld: 109 mg/dL — ABNORMAL HIGH (ref 65–99)
Potassium: 4.3 mmol/L (ref 3.5–5.1)
Sodium: 134 mmol/L — ABNORMAL LOW (ref 135–145)

## 2017-08-04 MED ORDER — OXYCODONE HCL 5 MG PO TABS: 2.5000 mg | ORAL_TABLET | Freq: Four times a day (QID) | ORAL | 0 refills | Status: DC | PRN

## 2017-08-04 MED ORDER — CLOPIDOGREL BISULFATE 75 MG PO TABS: ORAL_TABLET | ORAL | 1 refills | Status: DC

## 2017-08-04 NOTE — Discharge Instructions (Signed)
RESUME [facility] ON Monday MARCH 25TH   HERNIA REPAIR: POST OP INSTRUCTIONS  ######################################################################  EAT Gradually transition to a high fiber diet with a fiber supplement over the next few weeks after discharge.  Start with a pureed / full liquid diet (see below)  WALK Walk an hour a day.  Control your pain to do that.    CONTROL PAIN Control pain so that you can walk, sleep, tolerate sneezing/coughing, go up/down stairs.  HAVE A BOWEL MOVEMENT DAILY Keep your bowels regular to avoid problems.  OK to try a laxative to override constipation.  OK to use an antidairrheal to slow down diarrhea.  Call if not better after 2 tries  CALL IF YOU HAVE PROBLEMS/CONCERNS Call if you are still struggling despite following these instructions. Call if you have concerns not answered by these instructions  ######################################################################    1. DIET: Follow a light bland diet the first 24 hours after arrival home, such as soup, liquids, crackers, etc.  Be sure to include lots of fluids daily.  Avoid fast food or heavy meals as your are more likely to get nauseated.  Eat a low fat the next few days after surgery. 2. Take your usually prescribed home medications unless otherwise directed. 3. PAIN CONTROL: a. Pain is best controlled by a usual combination of three different methods TOGETHER: i. Ice/Heat ii. Over the counter pain medication iii. Prescription pain medication b. Most patients will experience some swelling and bruising around the hernia(s) such as the bellybutton, groins, or old incisions.  Ice packs or heating pads (30-60 minutes up to 6 times a day) will help. Use ice for the first few days to help decrease swelling and bruising, then switch to heat to help relax tight/sore spots and speed recovery.  Some people prefer to use ice alone, heat alone, alternating between ice & heat.  Experiment to what works  for you.  Swelling and bruising can take several weeks to resolve.   c. It is helpful to take an over-the-counter pain medication regularly for the first few weeks.  Choose one of the following that works best for you: i. Naproxen (Aleve, etc)  Two 220mg  tabs twice a day ii. Ibuprofen (Advil, etc) Three 200mg  tabs four times a day (every meal & bedtime) iii. Acetaminophen (Tylenol, etc) 325-650mg  four times a day (every meal & bedtime) d. A  prescription for pain medication should be given to you upon discharge.  Take your pain medication as prescribed.  i. If you are having problems/concerns with the prescription medicine (does not control pain, nausea, vomiting, rash, itching, etc), please call us [phone] to see if we need to switch you to a different pain medicine that will work better for you and/or control your side effect better. ii. If you need a refill on your pain medication, please contact your pharmacy.  They will contact our office to request authorization. Prescriptions will not be filled after 5 pm or on week-ends. 4. Avoid getting constipated.  Between the surgery and the pain medications, it is common to experience some constipation.  Increasing fluid intake and taking a fiber supplement (such as Metamucil, Citrucel, FiberCon, MiraLax, etc) 1-2 times a day regularly will usually help prevent this problem from occurring.  A mild laxative (prune juice, Milk of Magnesia, MiraLax, etc) should be taken according to package directions if there are no bowel movements after 48 hours.   5. Wash / shower every day.  You may shower over the dressings  as they are waterproof.   6. Remove your waterproof bandages 5 days after surgery.  You may leave the incision open to air.  You may replace a dressing/Band-Aid to cover the incision for comfort if you wish.  Continue to shower over incision(s) after the dressing is off.    7. ACTIVITIES as tolerated:   a. You may resume regular (light) daily  activities beginning the next day--such as daily self-care, walking, climbing stairs--gradually increasing activities as tolerated.  If you can walk 30 minutes without difficulty, it is safe to try more intense activity such as jogging, treadmill, bicycling, low-impact aerobics, swimming, etc. b. Save the most intensive and strenuous activity for last such as sit-ups, heavy lifting, contact sports, etc  Refrain from any heavy lifting or straining until you are off narcotics for pain control.   c. DO NOT PUSH THROUGH PAIN.  Let pain be your guide: If it hurts to do something, don't do it.  Pain is your body warning you to avoid that activity for another week until the pain goes down. d. You may drive when you are no longer taking prescription pain medication, you can comfortably wear a seatbelt, and you can safely maneuver your car and apply brakes. e. Dennis Bast may have sexual intercourse when it is comfortable.  8. FOLLOW UP in our office a. Please call [facility] at [phone] to set up an appointment to see your surgeon in the office for a follow-up appointment approximately 2-3 weeks after your surgery. b. Make sure that you call for this appointment the day you arrive home to insure a convenient appointment time. 9.  IF YOU HAVE DISABILITY OR FAMILY LEAVE FORMS, BRING THEM TO THE OFFICE FOR PROCESSING.  DO NOT GIVE THEM TO YOUR DOCTOR.  WHEN TO CALL us [phone]: 1. Poor pain control 2. Reactions / problems with new medications (rash/itching, nausea, etc)  3. Fever over 101.5 F (38.5 C) 4. Inability to urinate 5. Nausea and/or vomiting 6. Worsening swelling or bruising 7. Continued bleeding from incision. 8. Increased pain, redness, or drainage from the incision   The clinic staff is available to answer your questions during regular business hours (8:30am-5pm).  Please dont hesitate to call and ask to speak to one of our nurses for clinical concerns.   If you have a medical emergency, go to  the nearest emergency room or call 911.  A surgeon from [facility] is always on call at the hospitals in [facility], [place], [place], [place], [place]  [address] ?  [address] [address], [place], [place]   [address] MAIN: [phone] ? TOLL FREE: [phone] ? FAX: [phone] [email or web address]   Inguinal Hernia, Adult An inguinal hernia is when fat or the intestines push through the area where the leg meets the lower abdomen (groin) and create a rounded lump (bulge). This condition develops over time. There are three types of inguinal hernias. These types include:  Hernias that can be pushed back into the belly (are reducible).  Hernias that are not reducible (are incarcerated).  Hernias that are not reducible and lose their blood supply (are strangulated). This type of hernia requires emergency surgery.  What are the causes? This condition is caused by having a weak spot in the muscles or tissue. This weakness lets the hernia poke through. This condition can be triggered by:  Suddenly straining the muscles of the lower abdomen.  Lifting heavy objects.  Straining to  have a bowel movement. Difficult bowel movements (constipation) can lead to this.  Coughing.  What increases the risk? This condition is more likely to develop in:  Men.  Pregnant women.  People who: ? Are overweight. ? Work in jobs that require long periods of standing or heavy lifting. ? Have had an inguinal hernia before. ? Smoke or have lung disease. These factors can lead to long-lasting (chronic) coughing.  What are the signs or symptoms? Symptoms can depend on the size of the hernia. Often, a small inguinal hernia has no symptoms. Symptoms of a larger hernia include:  A lump in the groin. This is easier to see when the person is standing. It might not be visible when he or she is lying down.  Pain or burning in the groin. This occurs  especially when lifting, straining, or coughing.  A dull ache or a feeling of pressure in the groin.  A lump in the scrotum in men.  Symptoms of a strangulated inguinal hernia can include:  A bulge in the groin that is very painful and tender to the touch.  A bulge that turns red or purple.  Fever, nausea, and vomiting.  The inability to have a bowel movement or to pass gas.  How is this diagnosed? This condition is diagnosed with a medical history and physical exam. Your health care provider may feel your groin area and ask you to cough. How is this treated? Treatment for this condition varies depending on the size of your hernia and whether you have symptoms. If you do not have symptoms, your health care provider may have you watch your hernia carefully and come in for follow-up visits. If your hernia is larger or if you have symptoms, your treatment will include surgery. Follow these instructions at home: Lifestyle  Drink enough fluid to keep your urine clear or pale yellow.  Eat a diet that includes a lot of fiber. Eat plenty of fruits, vegetables, and whole grains. Talk with your health care provider if you have questions.  Avoid lifting heavy objects.  Avoid standing for long periods of time.  Do not use tobacco products, including cigarettes, chewing tobacco, or e-cigarettes. If you need help quitting, ask your health care provider.  Maintain a healthy weight. General instructions  Do not try to force the hernia back in.  Watch your hernia for any changes in color or size. Let your health care provider know if any changes occur.  Take over-the-counter and prescription medicines only as told by your health care provider.  Keep all follow-up visits as told by your health care provider. This is important. Contact a health care provider if:  You have a fever.  You have new symptoms.  Your symptoms get worse. Get help right away if:  You have pain in the groin  that suddenly gets worse.  A bulge in the groin gets bigger suddenly and does not go down.  You are a man and you have a sudden pain in the scrotum, or the size of your scrotum suddenly changes.  A bulge in the groin area becomes red or purple and is painful to the touch.  You have nausea or vomiting that does not go away.  You feel your heart beating a lot more quickly than normal.  You cannot have a bowel movement or pass gas. This information is not intended to replace advice given to you by your health care provider. Make sure you discuss any questions you have  with your health care provider. Document Released: 09/17/2008 Document Revised: 10/07/2015 Document Reviewed: 03/11/2014 Elsevier Interactive Patient Education  2018 [employer].

## 2017-08-04 NOTE — Discharge Summary (Signed)
Physician Discharge Summary  Patient ID: [name] MRN: [id number] DOB/AGE: 11-12-1928  84 y.o.  Admit date: 08/03/2017 Discharge date: 08/04/2017   Patient Care Team: Gayland Curry, DO as PCP - General (Geriatric Medicine) Lorretta Harp, MD as PCP - Cardiology (Cardiology) Laurence Spates, MD as Consulting Physician (Gastroenterology) Myrlene Broker, MD as Attending Physician (Urology) Lorretta Harp, MD as Consulting Physician (Cardiology) Rozetta Nunnery, MD as Consulting Physician (Otolaryngology) Richarda Osmond, [place] (Dentistry) Shon Hough, MD as Consulting Physician (Ophthalmology) Iran Planas, MD as Consulting Physician (Orthopedic Surgery) Greer Pickerel, MD as Consulting Physician (General Surgery)  Discharge Diagnoses:  Active Problems:   S/P bilateral inguinal hernia repair   1 Day Post-Op  08/03/2017  POST-OPERATIVE DIAGNOSIS:   bilateral inguinal hernias  SURGERY:  08/03/2017  Procedure(s): LAPAROSCOPIC LEFT INGUINAL HERNIA REPAIR WITH MESH OPEN HERNIA REPAIR RIGHT INGUINAL ADULT WITH MESH INSERTION OF MESH  SURGEON:    Surgeon(s): Greer Pickerel, MD  Consults: None  Hospital Course:   The patient underwent the surgery above.  Postoperatively, the patient gradually mobilized and advanced to a solid diet.  Pain and other symptoms were treated aggressively.    By the time of discharge, the patient was walking well, eating food, having flatus.  Pain was well-controlled on an oral medications.  Based on meeting discharge criteria and continuing to recover, I felt it was safe for the patient to be discharged from the hospital to further recover with close followup.  OK to restart Plavix on POD#3 = Monday 3/25 AM.   Postoperative recommendations were discussed in detail w patient & her daughter.  They are written as well.  Discharged Condition: good  Disposition:  Follow-up Information    Greer Pickerel, MD. Schedule an  appointment as soon as possible for a visit in 3 weeks.   Specialty:  General Surgery Why:  For wound re-check Contact information: [place] [address] [phone]           Discharge disposition: 01-Home or Self Care       Discharge Instructions    Call MD for:   Complete by:  As directed    FEVER > 101.5 F  (temperatures < 101.5 F are not significant)   Call MD for:  extreme fatigue   Complete by:  As directed    Call MD for:  persistant dizziness or light-headedness   Complete by:  As directed    Call MD for:  persistant nausea and vomiting   Complete by:  As directed    Call MD for:  redness, tenderness, or signs of infection (pain, swelling, redness, odor or green/yellow discharge around incision site)   Complete by:  As directed    Call MD for:  severe uncontrolled pain   Complete by:  As directed    Diet - low sodium heart healthy   Complete by:  As directed    Start with a bland diet such as soups, liquids, starchy foods, low fat foods, etc. the first few days at home. Gradually advance to a solid, low-fat, high fiber diet by the end of the first week at home.   Add a fiber supplement to your diet (Metamucil, etc) If you feel full, bloated, or constipated, stay on a full liquid or pureed/blenderized diet for a few days until you feel better and are no longer constipated.   Discharge instructions   Complete by:  As directed  See Discharge Instructions If you are not getting better after two weeks or are noticing you are getting worse, contact our office [phone] for further advice.  We may need to adjust your medications, re-evaluate you in the office, send you to the emergency room, or see what other things we can do to help. The clinic staff is available to answer your questions during regular business hours (8:30am-5pm).  Please don't hesitate to call and ask to speak to one of our nurses for clinical concerns.    A surgeon  from [facility] is always on call at the hospitals 24 hours/day If you have a medical emergency, go to the nearest emergency room or call 911.   Discharge wound care:   Complete by:  As directed    It is good for closed incision and even open wounds to be washed every day.  Shower every day.  Short baths are fine.  Wash the incisions and wounds clean with soap & water.    If you have a closed incision(s), wash the incision with soap & water every day.  You may leave closed incisions open to air if it is dry.   You may cover the incision with clean gauze & replace it after your daily shower for comfort. If you have skin tapes (Steristrips) or skin glue (Dermabond) on your incision, leave them in place.  They will fall off on their own like a scab.  You may trim any edges that curl up with clean scissors.  If you have staples, set up an appointment for them to be removed in the office in 10 days after surgery.  If you have a drain, wash around the skin exit site with soap & water and place a new dressing of gauze or band aid around the skin every day.  Keep the drain site clean & dry.   Driving Restrictions   Complete by:  As directed    You may drive when: - you are no longer taking narcotic prescription pain medication - you can comfortably wear a seatbelt - you can safely make sudden turns/stops without pain.   Increase activity slowly   Complete by:  As directed    Start light daily activities --- self-care, walking, climbing stairs- beginning the day after surgery.  Gradually increase activities as tolerated.  Control your pain to be active.  Stop when you are tired.  Ideally, walk several times a day, eventually an hour a day.   Most people are back to most day-to-day activities in a few weeks.  It takes 4-6 weeks to get back to unrestricted, intense activity. If you can walk 30 minutes without difficulty, it is safe to try more intense activity such as jogging, treadmill,  bicycling, low-impact aerobics, swimming, etc. Save the most intensive and strenuous activity for last (Usually 4-8 weeks after surgery) such as sit-ups, heavy lifting, contact sports, etc.  Refrain from any intense heavy lifting or straining until you are off narcotics for pain control.  You will have off days, but things should improve week-by-week. DO NOT PUSH THROUGH PAIN.  Let pain be your guide: If it hurts to do something, don't do it.   Lifting restrictions   Complete by:  As directed    If you can walk 30 minutes without difficulty, it is safe to try more intense activity such as jogging, treadmill, bicycling, low-impact aerobics, swimming, etc. Save the most intensive and strenuous activity for last (Usually 4-8 weeks  after surgery) such as sit-ups, heavy lifting, contact sports, etc.   Refrain from any intense heavy lifting or straining until you are off narcotics for pain control.  You will have off days, but things should improve week-by-week. DO NOT PUSH THROUGH PAIN.  Let pain be your guide: If it hurts to do something, don't do it.  Pain is your body warning you to avoid that activity for another week until the pain goes down.   May shower / Bathe   Complete by:  As directed    May walk up steps   Complete by:  As directed    Sexual Activity Restrictions   Complete by:  As directed    You may have sexual intercourse when it is comfortable. If it hurts to do something, stop.      Allergies as of 08/04/2017      Reactions   Trimethoprim Other (See Comments)   Headaches/ ear pressure    Erythromycin    UNSPECIFIED REACTION    Macrodantin [nitrofurantoin Macrocrystal]    UNSPECIFIED REACTION    Penicillins    UNSPECIFIED REACTION  Has patient had a PCN reaction causing immediate rash, facial/tongue/throat swelling, SOB or lightheadedness with hypotension: Unknown Has patient had a PCN reaction causing severe rash involving mucus membranes or skin necrosis: Unknown Has  patient had a PCN reaction that required hospitalization: No Has patient had a PCN reaction occurring within the last 10 years: No If all of the above answers are "NO", then may proceed with Cephalosporin use.   Sulfa Antibiotics    UNSPECIFIED REACTION    Latex Itching      Medication List    TAKE these medications   acetaminophen 500 MG tablet Commonly known as:  TYLENOL Take 500 mg by mouth 2 (two) times daily as needed for mild pain or headache.   bacitracin-polymyxin b ointment Commonly known as:  POLYSPORIN Apply 1 application topically daily as needed (wound care).   bimatoprost 0.01 % Soln Commonly known as:  LUMIGAN Place 1 drop into both eyes at bedtime.   clopidogrel 75 MG tablet Commonly known as:  PLAVIX Restart Monday 3/25 What changed:  See the new instructions.   hydrocortisone cream 1 % Apply 1 application topically as needed for itching.   metoprolol succinate 25 MG 24 hr tablet Commonly known as:  TOPROL-XL TAKE 1 TABLET ONCE TO REGULATE HEART AND CONTROL BLOOD PRESSURE.   oxyCODONE 5 MG immediate release tablet Commonly known as:  Oxy IR/ROXICODONE Take 0.5-1 tablets (2.5-5 mg total) by mouth every 6 (six) hours as needed for moderate pain or severe pain.   PRESERVISION AREDS 2 PO Take 1 tablet by mouth 2 (two) times daily.   ranitidine 150 MG tablet Commonly known as:  ZANTAC Take 150 mg by mouth daily as needed for heartburn.   SYSTANE ULTRA OP Place 1 vial into both eyes 2 (two) times daily. May use another dose during the day as needed for dry eye   TUMS ULTRA 1000 400 MG chewable tablet Generic drug:  calcium elemental as carbonate Chew 500 mg by mouth daily with lunch.   Vitamin D3 2000 units Tabs Take 2,000 Units by mouth 2 (two) times daily.            Discharge Care Instructions  (From admission, onward)        Start     Ordered   08/04/17 0000  Discharge wound care:    Comments:  It is good  for closed incision and even  open wounds to be washed every day.  Shower every day.  Short baths are fine.  Wash the incisions and wounds clean with soap & water.    If you have a closed incision(s), wash the incision with soap & water every day.  You may leave closed incisions open to air if it is dry.   You may cover the incision with clean gauze & replace it after your daily shower for comfort. If you have skin tapes (Steristrips) or skin glue (Dermabond) on your incision, leave them in place.  They will fall off on their own like a scab.  You may trim any edges that curl up with clean scissors.  If you have staples, set up an appointment for them to be removed in the office in 10 days after surgery.  If you have a drain, wash around the skin exit site with soap & water and place a new dressing of gauze or band aid around the skin every day.  Keep the drain site clean & dry.   08/04/17 0819      Significant Diagnostic Studies:  Results for orders placed or performed during the hospital encounter of 08/03/17 (from the past 72 hour(s))  Creatinine, serum     Status: None   Collection Time: 08/03/17  1:42 PM  Result Value Ref Range   Creatinine, Ser 0.73 0.44 - 1.00 mg/dL   GFR calc non Af Amer >60 >60 mL/min   GFR calc Af Amer >60 >60 mL/min    Comment: (NOTE) The eGFR has been calculated using the CKD EPI equation. This calculation has not been validated in all clinical situations. eGFR's persistently <60 mL/min signify possible Chronic Kidney Disease. Performed at [facility], [place] [address], [place], [place] [address]   Basic metabolic panel     Status: Abnormal   Collection Time: 08/04/17  6:29 AM  Result Value Ref Range   Sodium 134 (L) 135 - 145 mmol/L   Potassium 4.3 3.5 - 5.1 mmol/L   Chloride 99 (L) 101 - 111 mmol/L   CO2 24 22 - 32 mmol/L   Glucose, Bld 109 (H) 65 - 99 mg/dL   BUN 12 6 - 20 mg/dL   Creatinine, Ser 0.70 0.44 - 1.00 mg/dL   Calcium 8.5 (L) 8.9 - 10.3 mg/dL   GFR calc non Af  Amer >60 >60 mL/min   GFR calc Af Amer >60 >60 mL/min    Comment: (NOTE) The eGFR has been calculated using the CKD EPI equation. This calculation has not been validated in all clinical situations. eGFR's persistently <60 mL/min signify possible Chronic Kidney Disease.    Anion gap 11 5 - 15    Comment: Performed at [facility] [address] [place], [place] [address]    No results found.  Discharge Exam: Blood pressure (!) 147/67, pulse (!) 57, temperature 97.6 F (36.4 C), temperature source Oral, resp. rate 16, height 5' (1.524 m), weight 52.6 kg (116 lb), SpO2 100 %.  General: Pt awake/alert/oriented x4 in No acute distress Eyes: PERRL, normal EOM.  Sclera clear.  No icterus Neuro: CN II-XII intact w/o focal sensory/motor deficits. Lymph: No head/neck/groin lymphadenopathy Psych:  No delerium/psychosis/paranoia HENT: Normocephalic, Mucus membranes moist.  No thrush Neck: Supple, No tracheal deviation Chest: No chest wall pain w good excursion CV:  Pulses intact.  Regular rhythm MS: Normal AROM mjr joints.  No obvious deformity Abdomen: Soft.  Nondistended.  Mildly tender at incisions only.  No evidence of peritonitis.  No incarcerated hernias. GU:  No hematoma/recurrent inguinal hernias Ext:  SCDs BLE.  No mjr edema.  No cyanosis Skin: No petechiae / purpura  Past Medical History:  Diagnosis Date  . Cervicalgia   . Chest pain at rest 01/07/2012  . Displacement of cervical intervertebral disc without myelopathy   . Diverticulosis of colon (without mention of hemorrhage)   . Female stress incontinence   . GERD (gastroesophageal reflux disease)   . Inguinal hernia without mention of obstruction or gangrene, unilateral or unspecified, (not specified as recurrent)   . Internal hemorrhoids without mention of complication   . Lumbago   . Macular degeneration (senile) of retina, unspecified   . Osteoarthrosis, unspecified whether generalized or localized, unspecified  site   . Other and unspecified hyperlipidemia   . Pain in joint, hand   . Palpitations   . Reflux esophagitis   . Scoliosis (and kyphoscoliosis), idiopathic   . Senile osteoporosis   . Skin disorder   . Thyroid disease   . Unruptured popliteal cyst 06/24/2014   Right knee   . Unspecified essential hypertension   . Unspecified glaucoma(365.9)   . Unspecified hypothyroidism   . Unspecified vitamin D deficiency     Past Surgical History:  Procedure Laterality Date  . ABDOMINAL HYSTERECTOMY  1975   Dr Mallie Mussel  . APPENDECTOMY  1988  . cardiolite myocardial perfusion study    . DOPPLER ECHOCARDIOGRAPHY    . EYE SURGERY Bilateral 2009   cataract removed right eye, Dr Charise Killian  . FOOT SURGERY  august 2013   Doran Durand, MD  . INGUINAL HERNIA REPAIR Bilateral    w/mesh  . NM MYOVIEW LTD     negative  . TONSILLECTOMY  1937  . TOOTH EXTRACTION  09/16/13   Dr Carlos American    Social History   Socioeconomic History  . Marital status: Widowed    Spouse name: Not on file  . Number of children: 2  . Years of education: college  . Highest education level: Not on file  Occupational History    Comment: retired  Scientific laboratory technician  . Financial resource strain: Not on file  . Food insecurity:    Worry: Not on file    Inability: Not on file  . Transportation needs:    Medical: Not on file    Non-medical: Not on file  Tobacco Use  . Smoking status: Never Smoker  . Smokeless tobacco: Never Used  Substance and Sexual Activity  . Alcohol use: No  . Drug use: No  . Sexual activity: Never  Lifestyle  . Physical activity:    Days per week: Not on file    Minutes per session: Not on file  . Stress: Not on file  Relationships  . Social connections:    Talks on phone: Not on file    Gets together: Not on file    Attends religious service: Not on file    Active member of club or organization: Not on file    Attends meetings of clubs or organizations: Not on file    Relationship status: Not on file   . Intimate partner violence:    Fear of current or ex partner: Not on file    Emotionally abused: Not on file    Physically abused: Not on file    Forced sexual activity: Not on file  Other Topics Concern  . Not on file  Social History Narrative  Patient lives at home with her daughter [name]) , moved to [facility] 02/02/16 [place]    Widowed.   Retired.   EducationNurse, mental health.   Right handed.   Caffeine- None some times tea very rare.   Never smoked   Alcohol none    Family History  Problem Relation Age of Onset  . CAD Mother   . Parkinson's disease Mother   . Cancer Father        colon cancer  . Parkinson's disease Father   . Stroke Maternal Grandmother     Current Facility-Administered Medications  Medication Dose Route Frequency Provider Last Rate Last Dose  . acetaminophen (TYLENOL) tablet 650 mg  650 mg Oral Q6H Greer Pickerel, MD   650 mg at 08/04/17 0550  . diphenhydrAMINE (BENADRYL) 12.5 MG/5ML elixir 12.5 mg  12.5 mg Oral Q6H PRN Greer Pickerel, MD       Or  . diphenhydrAMINE (BENADRYL) injection 12.5 mg  12.5 mg Intravenous Q6H PRN Greer Pickerel, MD      . docusate sodium (COLACE) capsule 100 mg  100 mg Oral BID Greer Pickerel, MD   100 mg at 08/03/17 2103  . gabapentin (NEURONTIN) capsule 100 mg  100 mg Oral BID Greer Pickerel, MD   100 mg at 08/03/17 2103  . heparin injection 5,000 Units  5,000 Units Subcutaneous Q8H Greer Pickerel, MD   5,000 Units at 08/04/17 0550  . latanoprost (XALATAN) 0.005 % ophthalmic solution 1 drop  1 drop Both Eyes QHS Greer Pickerel, MD   1 drop at 08/03/17 2104  . metoprolol succinate (TOPROL-XL) 24 hr tablet 25 mg  25 mg Oral Daily Greer Pickerel, MD      . morphine 4 MG/ML injection 1 mg  1 mg Intravenous Q2H PRN Greer Pickerel, MD      . ondansetron (ZOFRAN-ODT) disintegrating tablet 4 mg  4 mg Oral Q6H PRN Greer Pickerel, MD       Or  . ondansetron [facility]) injection 4 mg  4 mg Intravenous Q6H PRN Greer Pickerel, MD      . oxyCODONE (Oxy  IR/ROXICODONE) immediate release tablet 2.5 mg  2.5 mg Oral Q6H PRN Greer Pickerel, MD      . pantoprazole (PROTONIX) EC tablet 40 mg  40 mg Oral Daily Greer Pickerel, MD      . simethicone [facility]) chewable tablet 40 mg  40 mg Oral Q6H PRN Greer Pickerel, MD         Allergies  Allergen Reactions  . Trimethoprim Other (See Comments)    Headaches/ ear pressure   . Erythromycin     UNSPECIFIED REACTION   . Macrodantin [Nitrofurantoin Macrocrystal]     UNSPECIFIED REACTION   . Penicillins     UNSPECIFIED REACTION  Has patient had a PCN reaction causing immediate rash, facial/tongue/throat swelling, SOB or lightheadedness with hypotension: Unknown Has patient had a PCN reaction causing severe rash involving mucus membranes or skin necrosis: Unknown Has patient had a PCN reaction that required hospitalization: No Has patient had a PCN reaction occurring within the last 10 years: No If all of the above answers are "NO", then may proceed with Cephalosporin use.  . Sulfa Antibiotics     UNSPECIFIED REACTION   . Latex Itching    Signed: Morton Peters, M.D., F.A.C.S. Gastrointestinal and Minimally Invasive Surgery [facility] [address], [place], [place] [address] [phone] Main / Paging   08/04/2017, 8:21  AM    

## 2017-08-06 ENCOUNTER — Telehealth: Payer: Self-pay

## 2017-08-06 ENCOUNTER — Encounter (HOSPITAL_COMMUNITY): Payer: Self-pay | Admitting: General Surgery

## 2017-08-06 NOTE — Telephone Encounter (Signed)
Transition Care Management Follow-Up Telephone Call   Date discharged and where: [facility] on 08/04/2017  How have you been since you were released from the hospital?  No bleeding and  hasn't had to take anything more than tylenol for pain. Been sleeping well and getting plenty of rest.  Any patient concerns? None  Items Reviewed:   Meds: Y  Allergies: Y  Dietary Changes Reviewed: Y  Functional Questionnaire:  Independent-I Dependent-D  ADLs:   Dressing- I    Eating- I   Maintaining continence- I   Transferring-I w/ walker or cane   Transportation- I   Meal Prep- I   Managing Meds- I  Confirmed importance and Date/Time of follow-up visits scheduled: Yes, pt will f/u with surgeon   Confirmed with patient if condition worsens to call PCP or go to the Emergency Dept. Patient was given office number and encouraged to call back with questions or concerns: Yes

## 2017-08-12 ENCOUNTER — Encounter: Payer: Self-pay | Admitting: Emergency Medicine

## 2017-08-12 ENCOUNTER — Emergency Department (INDEPENDENT_AMBULATORY_CARE_PROVIDER_SITE_OTHER)
Admission: EM | Admit: 2017-08-12 | Discharge: 2017-08-12 | Disposition: A | Discharge status: Home / Self Care | Payer: Medicare Other | Source: Home / Self Care | Attending: Emergency Medicine | Admitting: Emergency Medicine

## 2017-08-12 DIAGNOSIS — N3001 Acute cystitis with hematuria: Secondary | ICD-10-CM | POA: Diagnosis not present

## 2017-08-12 LAB — POCT URINALYSIS DIP (MANUAL ENTRY)
Bilirubin, UA: NEGATIVE
Glucose, UA: NEGATIVE mg/dL
Ketones, POC UA: NEGATIVE mg/dL
Nitrite, UA: NEGATIVE
Protein Ur, POC: NEGATIVE mg/dL
Spec Grav, UA: 1.01 (ref 1.010–1.025)
Urobilinogen, UA: 0.2 E.U./dL
pH, UA: 5.5 (ref 5.0–8.0)

## 2017-08-12 MED ORDER — CIPROFLOXACIN HCL 250 MG PO TABS: 250.0000 mg | ORAL_TABLET | Freq: Two times a day (BID) | ORAL | 0 refills | Status: DC

## 2017-08-12 NOTE — ED Provider Notes (Signed)
[facility]    CSN: [id number] Arrival date & time: 08/12/17  1329  Here with adult daughter   History   Chief Complaint Chief Complaint  Patient presents with  . Recurrent UTI    HPI [name] is a 84 y.o. female.   HPI This is a 84 y.o. female who presents today with UTI symptoms for 1 day. + dysuria + frequency + urgency No hematuria No vaginal discharge No fever/chills No lower abdominal pain No nausea No vomiting No back pain No fatigue Has tried over-the-counter measures without improvement.  She states she had similar UTI, treated with Cipro 2-3 years ago which worked great without side effects.  Note she has multiple drug allergies    Past Medical History:  Diagnosis Date  . Cervicalgia   . Chest pain at rest 01/07/2012  . Displacement of cervical intervertebral disc without myelopathy   . Diverticulosis of colon (without mention of hemorrhage)   . Female stress incontinence   . GERD (gastroesophageal reflux disease)   . Inguinal hernia without mention of obstruction or gangrene, unilateral or unspecified, (not specified as recurrent)   . Internal hemorrhoids without mention of complication   . Lumbago   . Macular degeneration (senile) of retina, unspecified   . Osteoarthrosis, unspecified whether generalized or localized, unspecified site   . Other and unspecified hyperlipidemia   . Pain in joint, hand   . Palpitations   . Reflux esophagitis   . Scoliosis (and kyphoscoliosis), idiopathic   . Senile osteoporosis   . Skin disorder   . Thyroid disease   . Unruptured popliteal cyst 06/24/2014   Right knee   . Unspecified essential hypertension   . Unspecified glaucoma(365.9)   . Unspecified hypothyroidism   . Unspecified vitamin D deficiency     Patient Active Problem List   Diagnosis Date Noted  . S/P bilateral inguinal hernia repair 08/03/2017  . Advance care planning 02/01/2017  . Right hip pain 08/29/2016  . Pain of  right sacroiliac joint 06/27/2016  . Edema 03/08/2016  . Right shoulder pain 03/10/2015  . Corn 12/30/2014  . Unruptured popliteal cyst 06/24/2014  . Female stress incontinence   . Inguinal hernia 12/24/2013  . Balance problem 12/24/2013  . Brain aneurysm 05/27/2013  . TIA (transient ischemic attack) 05/02/2013  . GERD (gastroesophageal reflux disease) 03/24/2013  . Hypothyroidism   . Senile osteopenia   . Unspecified vitamin D deficiency   . Macular degeneration   . Unspecified glaucoma(365.9)   . Essential hypertension   . Hyperlipidemia     Past Surgical History:  Procedure Laterality Date  . ABDOMINAL HYSTERECTOMY  1975   Dr Mallie Mussel  . APPENDECTOMY  1988  . cardiolite myocardial perfusion study    . DOPPLER ECHOCARDIOGRAPHY    . EYE SURGERY Bilateral 2009   cataract removed right eye, Dr Charise Killian  . FOOT SURGERY  august 2013   Doran Durand, MD  . INGUINAL HERNIA REPAIR Bilateral    w/mesh  . INGUINAL HERNIA REPAIR Left 08/03/2017   Procedure: LAPAROSCOPIC LEFT INGUINAL HERNIA REPAIR WITH MESH;  Surgeon: Greer Pickerel, MD;  Location: [facility];  Service: General;  Laterality: Left;  . INGUINAL HERNIA REPAIR Right 08/03/2017   Procedure: HERNIA REPAIR RIGHT INGUINAL ADULT WITH MESH;  Surgeon: Greer Pickerel, MD;  Location: [facility];  Service: General;  Laterality: Right;  . INSERTION OF MESH Bilateral 08/03/2017   Procedure: INSERTION OF MESH;  Surgeon: Greer Pickerel, MD;  Location: [facility];  Service: General;  Laterality: Bilateral;  . NM MYOVIEW LTD     negative  . TONSILLECTOMY  1937  . TOOTH EXTRACTION  09/16/13   Dr Carlos American    OB History   None      Home Medications    Prior to Admission medications   Medication Sig Start Date End Date Taking? Authorizing Provider  acetaminophen (TYLENOL) 500 MG tablet Take 500 mg by mouth 2 (two) times daily as needed for mild pain or headache.     [provider]  bacitracin-polymyxin b (POLYSPORIN) ointment Apply 1 application  topically daily as needed (wound care).    [provider]  bimatoprost (LUMIGAN) 0.01 % SOLN Place 1 drop into both eyes at bedtime.    [provider]  calcium elemental as carbonate (TUMS ULTRA 1000) 400 MG chewable tablet Chew 500 mg by mouth daily with lunch.    [provider]  Cholecalciferol (VITAMIN D3) 2000 UNITS TABS Take 2,000 Units by mouth 2 (two) times daily.     [provider]  ciprofloxacin (CIPRO) 250 MG tablet Take 1 tablet (250 mg total) by mouth 2 (two) times daily. For 5 days 08/12/17   Jacqulyn Cane, MD  clopidogrel (PLAVIX) 75 MG tablet Restart Monday 3/25 08/04/17   Michael Boston, MD  hydrocortisone cream 1 % Apply 1 application topically as needed for itching.    [provider]  metoprolol succinate (TOPROL-XL) 25 MG 24 hr tablet TAKE 1 TABLET ONCE TO REGULATE HEART AND CONTROL BLOOD PRESSURE. 05/25/17   Reed, Tiffany L, DO  Multiple Vitamins-Minerals (PRESERVISION AREDS 2 PO) Take 1 tablet by mouth 2 (two) times daily.     [provider]  oxyCODONE (OXY IR/ROXICODONE) 5 MG immediate release tablet Take 0.5-1 tablets (2.5-5 mg total) by mouth every 6 (six) hours as needed for moderate pain or severe pain. 08/04/17   Michael Boston, MD  Polyethyl Glycol-Propyl Glycol (SYSTANE ULTRA OP) Place 1 vial into both eyes 2 (two) times daily. May use another dose during the day as needed for dry eye    [provider]  ranitidine (ZANTAC) 150 MG tablet Take 150 mg by mouth daily as needed for heartburn.     [provider]    Family History Family History  Problem Relation Age of Onset  . CAD Mother   . Parkinson's disease Mother   . Cancer Father        colon cancer  . Parkinson's disease Father   . Stroke Maternal Grandmother     Social History Social History   Tobacco Use  . Smoking status: Never Smoker  . Smokeless tobacco: Never Used  Substance Use Topics  . Alcohol use: No  . Drug use: No      Allergies   Trimethoprim; Erythromycin; Macrodantin [nitrofurantoin macrocrystal]; Penicillins; Sulfa antibiotics; and Latex   Review of Systems Review of Systems  All other systems reviewed and are negative.    Physical Exam Triage Vital Signs ED Triage Vitals [08/12/17 1415]  Enc Vitals Group     BP 131/70     Pulse Rate 67     Resp      Temp 97.9 F (36.6 C)     Temp Source Oral     SpO2 98 %     Weight 126 lb 8 oz (57.4 kg)     Height 5' (1.524 m)     Head Circumference      Peak Flow  Pain Score 0     Pain Loc      Pain Edu?      Excl. in [place]?    No data found.  Updated Vital Signs BP 131/70 (BP Location: Right Arm)   Pulse 67   Temp 97.9 F (36.6 C) (Oral)   Ht 5' (1.524 m)   Wt 126 lb 8 oz (57.4 kg)   SpO2 98%   BMI 24.71 kg/m   Visual Acuity Right Eye Distance:   Left Eye Distance:   Bilateral Distance:    Right Eye Near:   Left Eye Near:    Bilateral Near:     Physical Exam  Constitutional: She is oriented to person, place, and time. She appears well-developed and well-nourished. No distress.  HENT:  Mouth/Throat: Oropharynx is clear and moist.  Eyes: No scleral icterus.  Neck: Neck supple.  Cardiovascular: Normal rate, regular rhythm and normal heart sounds.  Pulmonary/Chest: Breath sounds normal.  Abdominal: Soft. She exhibits no mass. There is no hepatosplenomegaly. There is tenderness in the suprapubic area. There is no rebound, no guarding and no CVA tenderness.  Lymphadenopathy:    She has no cervical adenopathy.  Neurological: She is alert and oriented to person, place, and time.  Skin: Skin is warm and dry.  Nursing note and vitals reviewed.    UC Treatments / Results  Labs (all labs ordered are listed, but only abnormal results are displayed) Labs Reviewed  POCT URINALYSIS DIP (MANUAL ENTRY) - Abnormal; Notable for the following components:      Result Value   Blood, UA moderate (*)    Leukocytes, UA Small (1+)  (*)    All other components within normal limits  URINE CULTURE    EKG None Radiology No results found.  Procedures Procedures (including critical care time)  Medications Ordered in UC Medications - No data to display   Initial Impression / Assessment and Plan / UC Course  I have reviewed the triage vital signs and the nursing notes.  Pertinent labs & imaging results that were available during my care of the patient were reviewed by me and considered in my medical decision making (see chart for details).    Treatment options discussed, as well as risks, benefits, alternatives. Patient and daughter voiced understanding and agreement with the following plans: Cipro twice daily times 5 days See other instructions below  Uncomplicated acute cystitis.  Reviewed her extensive list of drug allergies and, in my opinion, the benefits outweigh the risks of prescribing Cipro, especially because she took Cipro 2 or 3 years ago for a similar UTI and did well without side effects.  Final Clinical Impressions(s) / UC Diagnoses   Final diagnoses:  Acute cystitis with hematuria    ED Discharge Orders        Ordered    ciprofloxacin (CIPRO) 250 MG tablet  2 times daily     08/12/17 1441    An After Visit Summary was printed and given to the patient.  Take the Cipro as prescribed twice a day for 5 days. Drink plenty of fluids. Also, see attached sheet on urinary tract infections. We are sending your urine off for urine culture.   Controlled Substance Prescriptions [place] Controlled Substance Registry consulted? Not Applicable   Jacqulyn Cane, MD 08/12/17 1510

## 2017-08-12 NOTE — ED Triage Notes (Signed)
Patient complaining of urgency, frequency, dysuria x 1 day, no hematuria.  Cipro has worked in the past.

## 2017-08-12 NOTE — Discharge Instructions (Addendum)
Take the Cipro as prescribed twice a day for 5 days. Drink plenty of fluids. Also, see attached sheet on urinary tract infections. We are sending your urine off for urine culture.

## 2017-08-15 ENCOUNTER — Telehealth: Payer: Self-pay

## 2017-08-15 LAB — URINE CULTURE
MICRO NUMBER:: 90400130
SPECIMEN QUALITY:: ADEQUATE

## 2017-08-15 NOTE — Telephone Encounter (Signed)
Spoke with patient, and she is taking medication as prescribed.  Feeling better.  Lab results given.

## 2017-08-20 ENCOUNTER — Ambulatory Visit (INDEPENDENT_AMBULATORY_CARE_PROVIDER_SITE_OTHER): Payer: Medicare Other | Admitting: Internal Medicine

## 2017-08-20 VITALS — BP 130/70 | HR 66 | Temp 97.9°F | Ht 60.0 in | Wt 124.0 lb

## 2017-08-20 DIAGNOSIS — N3000 Acute cystitis without hematuria: Secondary | ICD-10-CM | POA: Diagnosis not present

## 2017-08-20 DIAGNOSIS — I10 Essential (primary) hypertension: Secondary | ICD-10-CM

## 2017-08-20 DIAGNOSIS — R300 Dysuria: Secondary | ICD-10-CM | POA: Diagnosis not present

## 2017-08-20 LAB — POCT URINALYSIS DIPSTICK
Bilirubin, UA: NEGATIVE
Glucose, UA: NEGATIVE
Ketones, UA: NEGATIVE
Nitrite, UA: NEGATIVE
Protein, UA: NEGATIVE
Spec Grav, UA: 1.025 (ref 1.010–1.025)
Urobilinogen, UA: 0.2 E.U./dL
pH, UA: 6 (ref 5.0–8.0)

## 2017-08-20 MED ORDER — CIPROFLOXACIN HCL 500 MG PO TABS: 500.0000 mg | ORAL_TABLET | Freq: Two times a day (BID) | ORAL | 0 refills | Status: DC

## 2017-08-20 MED ORDER — METOPROLOL SUCCINATE ER 25 MG PO TB24: 25.0000 mg | ORAL_TABLET | Freq: Every day | ORAL | 3 refills | Status: DC

## 2017-08-20 NOTE — Patient Instructions (Addendum)
Take 5 more days of twice a day cipro for your urinary tract infection. Take yogurt while on antibiotics. Drink plenty of water and cranberry juice to flush the kidneys.   Be sure to wipe from front to back and clean up really well after diarrhea.  Increase your protein in your diet.  You may put a cushion over the callous on the outside of your foot to protect it.

## 2017-08-20 NOTE — Progress Notes (Signed)
Location:  [facility] clinic Provider:  Tiffany L. Mariea Clonts, D.O., C.M.D.  Code Status: FC Goals of Care:  Advanced Directives 08/20/2017  Does Patient Have a Medical Advance Directive? Yes  Type of Paramedic of [place];Living will;Out of facility DNR (pink MOST or yellow form)  Does patient want to make changes to medical advance directive? No - Patient declined  Copy of [place] in Chart? Yes  Pre-existing out of facility DNR order (yellow form or pink MOST form) Yellow form placed in chart (order not valid for inpatient use)     Chief Complaint  Patient presents with  . Follow-up    UTI follow-up  . ACP    HCPOA, LIVING WILL, DNR    HPI: Patient is a 84 y.o. female seen today for medical management of chronic diseases. Finished full course of Cipro Friday morning. Noticed a sensation of symptoms coming back on Saturday. She reports feeling her bladder. She is not burning as much as she was, but still has some burning. She is drinking a lot of water to help. She feels there is still an infection there.    Past Medical History:  Diagnosis Date  . Cervicalgia   . Chest pain at rest 01/07/2012  . Displacement of cervical intervertebral disc without myelopathy   . Diverticulosis of colon (without mention of hemorrhage)   . Female stress incontinence   . GERD (gastroesophageal reflux disease)   . Inguinal hernia without mention of obstruction or gangrene, unilateral or unspecified, (not specified as recurrent)   . Internal hemorrhoids without mention of complication   . Lumbago   . Macular degeneration (senile) of retina, unspecified   . Osteoarthrosis, unspecified whether generalized or localized, unspecified site   . Other and unspecified hyperlipidemia   . Pain in joint, hand   . Palpitations   . Reflux esophagitis   . Scoliosis (and kyphoscoliosis), idiopathic   . Senile osteoporosis   . Skin disorder   . Thyroid disease   .  Unruptured popliteal cyst 06/24/2014   Right knee   . Unspecified essential hypertension   . Unspecified glaucoma(365.9)   . Unspecified hypothyroidism   . Unspecified vitamin D deficiency     Past Surgical History:  Procedure Laterality Date  . ABDOMINAL HYSTERECTOMY  1975   Dr Mallie Mussel  . APPENDECTOMY  1988  . cardiolite myocardial perfusion study    . DOPPLER ECHOCARDIOGRAPHY    . EYE SURGERY Bilateral 2009   cataract removed right eye, Dr Charise Killian  . FOOT SURGERY  august 2013   Doran Durand, MD  . INGUINAL HERNIA REPAIR Bilateral    w/mesh  . INGUINAL HERNIA REPAIR Left 08/03/2017   Procedure: LAPAROSCOPIC LEFT INGUINAL HERNIA REPAIR WITH MESH;  Surgeon: Greer Pickerel, MD;  Location: [facility];  Service: General;  Laterality: Left;  . INGUINAL HERNIA REPAIR Right 08/03/2017   Procedure: HERNIA REPAIR RIGHT INGUINAL ADULT WITH MESH;  Surgeon: Greer Pickerel, MD;  Location: [facility];  Service: General;  Laterality: Right;  . INSERTION OF MESH Bilateral 08/03/2017   Procedure: INSERTION OF MESH;  Surgeon: Greer Pickerel, MD;  Location: [facility];  Service: General;  Laterality: Bilateral;  . NM MYOVIEW LTD     negative  . TONSILLECTOMY  1937  . TOOTH EXTRACTION  09/16/13   Dr Carlos American    Allergies  Allergen Reactions  . Trimethoprim Other (See Comments)    Headaches/ ear pressure   . Erythromycin     UNSPECIFIED  REACTION   . Macrodantin [Nitrofurantoin Macrocrystal]     UNSPECIFIED REACTION   . Penicillins     UNSPECIFIED REACTION  Has patient had a PCN reaction causing immediate rash, facial/tongue/throat swelling, SOB or lightheadedness with hypotension: Unknown Has patient had a PCN reaction causing severe rash involving mucus membranes or skin necrosis: Unknown Has patient had a PCN reaction that required hospitalization: No Has patient had a PCN reaction occurring within the last 10 years: No If all of the above answers are "NO", then may proceed with Cephalosporin use.  . Sulfa Antibiotics      UNSPECIFIED REACTION   . Latex Itching    Outpatient Encounter Medications as of 08/20/2017  Medication Sig  . acetaminophen (TYLENOL) 500 MG tablet Take 500 mg by mouth 2 (two) times daily as needed for mild pain or headache.   . bacitracin-polymyxin b (POLYSPORIN) ointment Apply 1 application topically daily as needed (wound care).  . bimatoprost (LUMIGAN) 0.01 % SOLN Place 1 drop into both eyes at bedtime.  . calcium elemental as carbonate (TUMS ULTRA 1000) 400 MG chewable tablet Chew 500 mg by mouth daily with lunch.  . Cholecalciferol (VITAMIN D3) 2000 UNITS TABS Take 2,000 Units by mouth 2 (two) times daily.   . clopidogrel (PLAVIX) 75 MG tablet Restart Monday 3/25  . hydrocortisone cream 1 % Apply 1 application topically as needed for itching.  . metoprolol succinate (TOPROL-XL) 25 MG 24 hr tablet Take 1 tablet (25 mg total) by mouth daily.  . Multiple Vitamins-Minerals (PRESERVISION AREDS 2 PO) Take 1 tablet by mouth 2 (two) times daily.   Vladimir Faster Glycol-Propyl Glycol (SYSTANE ULTRA OP) Place 1 vial into both eyes 2 (two) times daily. May use another dose during the day as needed for dry eye  . ranitidine (ZANTAC) 150 MG tablet Take 150 mg by mouth daily as needed for heartburn.   . [DISCONTINUED] ciprofloxacin (CIPRO) 250 MG tablet Take 1 tablet (250 mg total) by mouth 2 (two) times daily. For 5 days  . [DISCONTINUED] metoprolol succinate (TOPROL-XL) 25 MG 24 hr tablet TAKE 1 TABLET ONCE TO REGULATE HEART AND CONTROL BLOOD PRESSURE.  . [DISCONTINUED] oxyCODONE (OXY IR/ROXICODONE) 5 MG immediate release tablet Take 0.5-1 tablets (2.5-5 mg total) by mouth every 6 (six) hours as needed for moderate pain or severe pain.   No facility-administered encounter medications on file as of 08/20/2017.     Review of Systems:  Review of Systems  Constitutional: Negative for chills, fever and malaise/fatigue.  Eyes:       Glasses  Respiratory: Negative for cough and shortness of breath.     Cardiovascular: Negative for chest pain, palpitations and leg swelling.  Genitourinary: Positive for dysuria and frequency.  Musculoskeletal:       [name]   All other systems reviewed and are negative.   Health Maintenance  Topic Date Due  . INFLUENZA VACCINE  12/13/2017  . MAMMOGRAM  04/30/2018  . TETANUS/TDAP  03/20/2020  . DEXA SCAN  Completed  . PNA vac Low Risk Adult  Completed    Physical Exam: Vitals:   08/20/17 1410  BP: 130/70  Pulse: 66  Temp: 97.9 F (36.6 C)  TempSrc: Oral  SpO2: 97%  Weight: 124 lb (56.2 kg)   Body mass index is 24.22 kg/m. Physical Exam  Constitutional: She is oriented to person, place, and time. She appears well-developed and well-nourished.  Cardiovascular: Normal rate, regular rhythm, normal heart sounds and intact distal pulses.  Pulmonary/Chest: Breath  sounds normal.  Abdominal: Soft. Bowel sounds are normal. She exhibits no distension. There is no tenderness. There is no guarding and no CVA tenderness.  Musculoskeletal: Normal range of motion.  Neurological: She is alert and oriented to person, place, and time.  Skin: Skin is warm and dry. Capillary refill takes less than 2 seconds.  Psychiatric: She has a normal mood and affect. Her behavior is normal. Judgment and thought content normal.  Vitals reviewed.   Labs reviewed: Basic Metabolic Panel: Recent Labs    06/12/17 0908 07/26/17 1428 08/03/17 1342 08/04/17 0629  NA 139 138  --  134*  K 4.2 4.2  --  4.3  CL 102 103  --  99*  CO2 31 25  --  24  GLUCOSE 76 88  --  109*  BUN 21 17  --  12  CREATININE 0.68 0.67 0.73 0.70  CALCIUM 9.0 9.0  --  8.5*   Liver Function Tests: Recent Labs    06/12/17 0908 07/26/17 1428  AST 21 24  ALT 16 19  ALKPHOS  --  52  BILITOT 0.5 0.5  PROT 6.5 6.2*  ALBUMIN  --  3.7   No results for input(s): LIPASE, AMYLASE in the last 8760 hours. No results for input(s): AMMONIA in the last 8760 hours. CBC: Recent Labs    06/08/17 2013  06/12/17 0908 07/26/17 1428  WBC 6.0 5.4 7.3  NEUTROABS 3.1 2,549 4.6  HGB 12.9 12.8 12.4  HCT 38.7 37.6 37.7  MCV 93.5 90.0 94.3  PLT 206 231 203   Lipid Panel: Recent Labs    06/12/17 0908  CHOL 190  HDL 86  LDLCALC 85  TRIG 96  CHOLHDL 2.2   Lab Results  Component Value Date   HGBA1C 5.3 05/02/2013    Procedures since last visit: No results found.  Assessment/Plan  1. Dysuria It appears she is still positive for a UTI today in office. Will order 5 more days of Cipro for a full 10 day course. Educated about getting a probiotic or eating yogurt day to help keep healthy flora.    - POC Urinalysis Dipstick - ciprofloxacin (CIPRO) 500 MG tablet; Take 1 tablet (500 mg total) by mouth 2 (two) times daily. For 5 more days for UTI, take yogurt daily  Dispense: 10 tablet; Refill: 0  2. Acute cystitis without hematuria See 1.   - ciprofloxacin (CIPRO) 500 MG tablet; Take 1 tablet (500 mg total) by mouth 2 (two) times daily. For 5 more days for UTI, take yogurt daily  Dispense: 10 tablet; Refill: 0  3. Essential hypertension Blood pressure is good today. Will refill her toprol.   - metoprolol succinate (TOPROL-XL) 25 MG 24 hr tablet; Take 1 tablet (25 mg total) by mouth daily.  Dispense: 90 tablet; Refill: 0    Labs/tests ordered:  Orders Placed This Encounter  Procedures  . POC Urinalysis Dipstick    Next appt:  10/15/2017   Karen Kays, RN, DNP Student  [facility]iatric[facility] [phone], [place] [address] Cell Phone (Mon-Fri 8am-5pm):  [phone] On Call:  [phone] & follow prompts after 5pm & weekends Office Phone:  [phone] Office Fax:  [phone]

## 2017-08-21 ENCOUNTER — Encounter: Payer: Self-pay | Admitting: Internal Medicine

## 2017-08-22 LAB — URINE CULTURE
MICRO NUMBER:: 90430852
SPECIMEN QUALITY:: ADEQUATE

## 2017-08-23 ENCOUNTER — Telehealth: Payer: Self-pay | Admitting: *Deleted

## 2017-08-23 NOTE — Telephone Encounter (Signed)
Noted.  I'm not sure what happened there.  She should monitor for recurrence of the tremble and let us know if it happens again.

## 2017-08-23 NOTE — Telephone Encounter (Signed)
Patient states she feels better with taking the medication, but after she had her breakfast, she took her meds and starting a slight tremble, pt states this never happened before and only lasted a little while. Please advise

## 2017-08-24 NOTE — Telephone Encounter (Signed)
Spoke with patient and advised results   

## 2017-08-30 ENCOUNTER — Encounter: Payer: Self-pay | Admitting: Nurse Practitioner

## 2017-08-30 ENCOUNTER — Ambulatory Visit (INDEPENDENT_AMBULATORY_CARE_PROVIDER_SITE_OTHER): Payer: Medicare Other | Admitting: Nurse Practitioner

## 2017-08-30 VITALS — BP 126/78 | HR 70 | Temp 97.9°F | Ht 60.0 in | Wt 125.0 lb

## 2017-08-30 DIAGNOSIS — R300 Dysuria: Secondary | ICD-10-CM | POA: Diagnosis not present

## 2017-08-30 LAB — POCT URINALYSIS DIPSTICK
APPEARANCE: NORMAL
BILIRUBIN UA: NEGATIVE
Blood, UA: NEGATIVE
GLUCOSE UA: NEGATIVE
KETONES UA: NEGATIVE
Leukocytes, UA: NEGATIVE
Nitrite, UA: NEGATIVE
Protein, UA: NEGATIVE
Spec Grav, UA: 1.02 (ref 1.010–1.025)
UROBILINOGEN UA: 2 U/dL — AB
pH, UA: 7.5 (ref 5.0–8.0)

## 2017-08-30 MED ORDER — CEPHALEXIN 500 MG PO CAPS: 500.0000 mg | ORAL_CAPSULE | Freq: Two times a day (BID) | ORAL | 0 refills | Status: DC

## 2017-08-30 NOTE — Progress Notes (Signed)
Careteam: Patient Care Team: Gayland Curry, DO as PCP - General (Geriatric Medicine) Lorretta Harp, MD as PCP - Cardiology (Cardiology) Laurence Spates, MD as Consulting Physician (Gastroenterology) Myrlene Broker, MD as Attending Physician (Urology) Lorretta Harp, MD as Consulting Physician (Cardiology) Rozetta Nunnery, MD as Consulting Physician (Otolaryngology) Richarda Osmond, Pearson (Dentistry) Shon Hough, MD as Consulting Physician (Ophthalmology) Iran Planas, MD as Consulting Physician (Orthopedic Surgery) Greer Pickerel, MD as Consulting Physician (General Surgery)  Advanced Directive information    Allergies  Allergen Reactions  . Trimethoprim Other (See Comments)    Headaches/ ear pressure   . Erythromycin     UNSPECIFIED REACTION   . Macrodantin [Nitrofurantoin Macrocrystal]     UNSPECIFIED REACTION   . Penicillins     UNSPECIFIED REACTION  Has patient had a PCN reaction causing immediate rash, facial/tongue/throat swelling, SOB or lightheadedness with hypotension: Unknown Has patient had a PCN reaction causing severe rash involving mucus membranes or skin necrosis: Unknown Has patient had a PCN reaction that required hospitalization: No Has patient had a PCN reaction occurring within the last 10 years: No If all of the above answers are "NO", then may proceed with Cephalosporin use.  . Sulfa Antibiotics     UNSPECIFIED REACTION   . Latex Itching    Chief Complaint  Patient presents with  . Acute Visit    Pt is being seen due to urinary frequency and 2 episodes of dysuria last night. Pt completed rx for cipro 500 mg 5 days ago.      HPI: Patient is a 84 y.o. female seen in the office today due to burning with urination last night. Pt was prescribed Cipro 500 mg BID x 5 days 10 days ago due to dysuria with abnormal UA.  UA normal today but review of recent culture shows resistance to Cipro and now with recurrent symptoms.  Pt  without significant burning with urination at this time but pressure when urinate and lower abdominal discomfort. Increase urinary frequency- has increased her water intake   Review of Systems:  Review of Systems  Constitutional: Negative for chills, fever and malaise/fatigue.  Respiratory: Negative for cough.   Cardiovascular: Negative for palpitations and leg swelling.  Genitourinary: Positive for dysuria and frequency. Negative for flank pain, hematuria and urgency.    Past Medical History:  Diagnosis Date  . Cervicalgia   . Chest pain at rest 01/07/2012  . Displacement of cervical intervertebral disc without myelopathy   . Diverticulosis of colon (without mention of hemorrhage)   . Female stress incontinence   . GERD (gastroesophageal reflux disease)   . Inguinal hernia without mention of obstruction or gangrene, unilateral or unspecified, (not specified as recurrent)   . Internal hemorrhoids without mention of complication   . Lumbago   . Macular degeneration (senile) of retina, unspecified   . Osteoarthrosis, unspecified whether generalized or localized, unspecified site   . Other and unspecified hyperlipidemia   . Pain in joint, hand   . Palpitations   . Reflux esophagitis   . Scoliosis (and kyphoscoliosis), idiopathic   . Senile osteoporosis   . Skin disorder   . Thyroid disease   . Unruptured popliteal cyst 06/24/2014   Right knee   . Unspecified essential hypertension   . Unspecified glaucoma(365.9)   . Unspecified hypothyroidism   . Unspecified vitamin D deficiency    Past Surgical History:  Procedure Laterality Date  . ABDOMINAL HYSTERECTOMY  1975   Dr  Mallie Mussel  . APPENDECTOMY  1988  . cardiolite myocardial perfusion study    . DOPPLER ECHOCARDIOGRAPHY    . EYE SURGERY Bilateral 2009   cataract removed right eye, Dr Charise Killian  . FOOT SURGERY  august 2013   Doran Durand, MD  . INGUINAL HERNIA REPAIR Bilateral    w/mesh  . INGUINAL HERNIA REPAIR Left 08/03/2017    Procedure: LAPAROSCOPIC LEFT INGUINAL HERNIA REPAIR WITH MESH;  Surgeon: Greer Pickerel, MD;  Location: [facility];  Service: General;  Laterality: Left;  . INGUINAL HERNIA REPAIR Right 08/03/2017   Procedure: HERNIA REPAIR RIGHT INGUINAL ADULT WITH MESH;  Surgeon: Greer Pickerel, MD;  Location: [facility];  Service: General;  Laterality: Right;  . INSERTION OF MESH Bilateral 08/03/2017   Procedure: INSERTION OF MESH;  Surgeon: Greer Pickerel, MD;  Location: [facility];  Service: General;  Laterality: Bilateral;  . NM MYOVIEW LTD     negative  . TONSILLECTOMY  1937  . TOOTH EXTRACTION  09/16/13   Dr Carlos American   Social History:   reports that she has never smoked. She has never used smokeless tobacco. She reports that she does not drink alcohol or use drugs.  Family History  Problem Relation Age of Onset  . CAD Mother   . Parkinson's disease Mother   . Cancer Father        colon cancer  . Parkinson's disease Father   . Stroke Maternal Grandmother     Medications: Patient's Medications  New Prescriptions   No medications on file  Previous Medications   ACETAMINOPHEN (TYLENOL) 500 MG TABLET    Take 500 mg by mouth 2 (two) times daily as needed for mild pain or headache.    BACITRACIN-POLYMYXIN B (POLYSPORIN) OINTMENT    Apply 1 application topically daily as needed (wound care).   BIMATOPROST (LUMIGAN) 0.01 % SOLN    Place 1 drop into both eyes at bedtime.   CALCIUM ELEMENTAL AS CARBONATE (TUMS ULTRA 1000) 400 MG CHEWABLE TABLET    Chew 500 mg by mouth daily with lunch.   CHOLECALCIFEROL (VITAMIN D3) 2000 UNITS TABS    Take 2,000 Units by mouth 2 (two) times daily.    CLOPIDOGREL (PLAVIX) 75 MG TABLET    Restart Monday 3/25   HYDROCORTISONE CREAM 1 %    Apply 1 application topically as needed for itching.   METOPROLOL SUCCINATE (TOPROL-XL) 25 MG 24 HR TABLET    Take 1 tablet (25 mg total) by mouth daily.   MULTIPLE VITAMINS-MINERALS (PRESERVISION AREDS 2 PO)    Take 1 tablet by mouth 2 (two) times daily.     POLYETHYL GLYCOL-PROPYL GLYCOL (SYSTANE ULTRA OP)    Place 1 vial into both eyes 2 (two) times daily. May use another dose during the day as needed for dry eye   RANITIDINE (ZANTAC) 150 MG TABLET    Take 150 mg by mouth daily as needed for heartburn.   Modified Medications   No medications on file  Discontinued Medications   CIPROFLOXACIN (CIPRO) 500 MG TABLET    Take 1 tablet (500 mg total) by mouth 2 (two) times daily. For 5 more days for UTI, take yogurt daily     Physical Exam:  Vitals:   08/30/17 1548  BP: 126/78  Pulse: 70  Temp: 97.9 F (36.6 C)  TempSrc: Oral  SpO2: 99%  Weight: 125 lb (56.7 kg)  Height: 5' (1.524 m)   Body mass index is 24.41 kg/m.  Physical Exam  Constitutional: She is  oriented to person, place, and time. She appears well-developed and well-nourished.  Cardiovascular: Normal rate, regular rhythm and normal heart sounds.  Pulmonary/Chest: Effort normal and breath sounds normal.  Abdominal: Soft. Bowel sounds are normal. She exhibits no distension. There is tenderness (suprapubic). There is no guarding and no CVA tenderness.  Musculoskeletal: Normal range of motion.  Neurological: She is alert and oriented to person, place, and time.  Skin: Skin is warm and dry. Capillary refill takes less than 2 seconds.  Psychiatric: She has a normal mood and affect. Her behavior is normal.    Labs reviewed: Basic Metabolic Panel: Recent Labs    06/12/17 0908 07/26/17 1428 08/03/17 1342 08/04/17 0629  NA 139 138  --  134*  K 4.2 4.2  --  4.3  CL 102 103  --  99*  CO2 31 25  --  24  GLUCOSE 76 88  --  109*  BUN 21 17  --  12  CREATININE 0.68 0.67 0.73 0.70  CALCIUM 9.0 9.0  --  8.5*   Liver Function Tests: Recent Labs    06/12/17 0908 07/26/17 1428  AST 21 24  ALT 16 19  ALKPHOS  --  52  BILITOT 0.5 0.5  PROT 6.5 6.2*  ALBUMIN  --  3.7   No results for input(s): LIPASE, AMYLASE in the last 8760 hours. No results for input(s): AMMONIA in the  last 8760 hours. CBC: Recent Labs    06/08/17 2013 06/12/17 0908 07/26/17 1428  WBC 6.0 5.4 7.3  NEUTROABS 3.1 2,549 4.6  HGB 12.9 12.8 12.4  HCT 38.7 37.6 37.7  MCV 93.5 90.0 94.3  PLT 206 231 203   Lipid Panel: Recent Labs    06/12/17 0908  CHOL 190  HDL 86  LDLCALC 85  TRIG 96  CHOLHDL 2.2   TSH: No results for input(s): TSH in the last 8760 hours. A1C: Lab Results  Component Value Date   HGBA1C 5.3 05/02/2013     Assessment/Plan 1. Dysuria - POCT urinalysis dipstick normal however after reviewing last culture from 10 days ago will treat with keflex for 7 days - cephALEXin (KEFLEX) 500 MG capsule; Take 1 capsule (500 mg total) by mouth 2 (two) times daily.  Dispense: 14 capsule; Refill: 0 -noted to have PCN allergy however unsure of what allergy actually was, to monitor for signs of allergic reaction, precautions given - Culture, Urine; Future - Culture, Urine -to continue to increase hydration   Next appt: 10/15/2017 Janett Billow K. [place], [facility] [phone]

## 2017-08-30 NOTE — Patient Instructions (Addendum)
To start keflex 500 mg by mouth twice daily Take probiotic twice daily while on antibiotic Continue to increase water intake  Monitor for rash/hives/swelling If you have swelling of the face/lips/tongue to go to the ED immediately  If you have rash/hives stop medication immediately   Notify office if you have a reaction

## 2017-09-01 LAB — URINE CULTURE
MICRO NUMBER:: 90479509
SPECIMEN QUALITY: ADEQUATE

## 2017-09-10 DIAGNOSIS — H04123 Dry eye syndrome of bilateral lacrimal glands: Secondary | ICD-10-CM | POA: Diagnosis not present

## 2017-09-10 DIAGNOSIS — H353132 Nonexudative age-related macular degeneration, bilateral, intermediate dry stage: Secondary | ICD-10-CM | POA: Diagnosis not present

## 2017-09-10 DIAGNOSIS — H401132 Primary open-angle glaucoma, bilateral, moderate stage: Secondary | ICD-10-CM | POA: Diagnosis not present

## 2017-09-11 ENCOUNTER — Telehealth: Payer: Self-pay | Admitting: *Deleted

## 2017-09-11 DIAGNOSIS — N390 Urinary tract infection, site not specified: Secondary | ICD-10-CM

## 2017-09-11 NOTE — Telephone Encounter (Signed)
Patient called and stated that she is having Symptoms of UTI again. Burning with urination, pressure, frequency. Stated that she has been seen for this and given antibiotic 3 different times:  March 31-Dr. [facility] April 8-Dr. Mariea Clonts April 18-[place]  Patient is requesting a referral to Dr. Lamonte Richer [phone] Please Advise.

## 2017-09-11 NOTE — Telephone Encounter (Signed)
Referral was placed 

## 2017-09-11 NOTE — Telephone Encounter (Signed)
Patient verbalized understanding that referral was placed.

## 2017-09-12 DIAGNOSIS — N390 Urinary tract infection, site not specified: Secondary | ICD-10-CM | POA: Diagnosis not present

## 2017-09-12 DIAGNOSIS — H409 Unspecified glaucoma: Secondary | ICD-10-CM | POA: Insufficient documentation

## 2017-09-14 DIAGNOSIS — L602 Onychogryphosis: Secondary | ICD-10-CM | POA: Diagnosis not present

## 2017-09-14 DIAGNOSIS — M216X1 Other acquired deformities of right foot: Secondary | ICD-10-CM | POA: Diagnosis not present

## 2017-09-14 DIAGNOSIS — L84 Corns and callosities: Secondary | ICD-10-CM | POA: Diagnosis not present

## 2017-09-19 DIAGNOSIS — N3020 Other chronic cystitis without hematuria: Secondary | ICD-10-CM | POA: Insufficient documentation

## 2017-09-20 DIAGNOSIS — M25572 Pain in left ankle and joints of left foot: Secondary | ICD-10-CM | POA: Diagnosis not present

## 2017-09-21 DIAGNOSIS — N3946 Mixed incontinence: Secondary | ICD-10-CM | POA: Diagnosis not present

## 2017-09-21 DIAGNOSIS — N3020 Other chronic cystitis without hematuria: Secondary | ICD-10-CM | POA: Diagnosis not present

## 2017-10-01 DIAGNOSIS — B9620 Unspecified Escherichia coli [E. coli] as the cause of diseases classified elsewhere: Secondary | ICD-10-CM | POA: Diagnosis not present

## 2017-10-01 DIAGNOSIS — R399 Unspecified symptoms and signs involving the genitourinary system: Secondary | ICD-10-CM | POA: Diagnosis not present

## 2017-10-01 DIAGNOSIS — N390 Urinary tract infection, site not specified: Secondary | ICD-10-CM | POA: Diagnosis not present

## 2017-10-04 ENCOUNTER — Ambulatory Visit: Payer: Medicare Other | Admitting: Internal Medicine

## 2017-10-15 ENCOUNTER — Ambulatory Visit (INDEPENDENT_AMBULATORY_CARE_PROVIDER_SITE_OTHER): Payer: Medicare Other | Admitting: Internal Medicine

## 2017-10-15 ENCOUNTER — Ambulatory Visit (INDEPENDENT_AMBULATORY_CARE_PROVIDER_SITE_OTHER): Payer: Medicare Other

## 2017-10-15 ENCOUNTER — Encounter: Payer: Self-pay | Admitting: Internal Medicine

## 2017-10-15 VITALS — BP 120/58 | HR 66 | Temp 97.8°F | Ht 60.0 in | Wt 126.0 lb

## 2017-10-15 DIAGNOSIS — Z87440 Personal history of urinary (tract) infections: Secondary | ICD-10-CM | POA: Diagnosis not present

## 2017-10-15 DIAGNOSIS — W06XXXA Fall from bed, initial encounter: Secondary | ICD-10-CM | POA: Diagnosis not present

## 2017-10-15 DIAGNOSIS — Z0000 Encounter for general adult medical examination without abnormal findings: Secondary | ICD-10-CM

## 2017-10-15 DIAGNOSIS — I872 Venous insufficiency (chronic) (peripheral): Secondary | ICD-10-CM | POA: Diagnosis not present

## 2017-10-15 DIAGNOSIS — I10 Essential (primary) hypertension: Secondary | ICD-10-CM

## 2017-10-15 NOTE — Progress Notes (Signed)
Location:  [facility] clinic Provider:  Holiday Mcmenamin L. Mariea Clonts, D.O., C.M.D.  Code Status: DNR Goals of Care:  Advanced Directives 10/15/2017  Does Patient Have a Medical Advance Directive? Yes  Type of Paramedic of [place];Living will;Out of facility DNR (pink MOST or yellow form)  Does patient want to make changes to medical advance directive? No - Patient declined  Copy of [place] in Chart? Yes  Pre-existing out of facility DNR order (yellow form or pink MOST form) Yellow form placed in chart (order not valid for inpatient use)   Chief Complaint  Patient presents with  . Medical Management of Chronic Issues    43mth follow-up    HPI: Patient is a 84 y.o. female seen today for medical management of chronic diseases.    Reviewed AWV.  MMSE 30/30 and passed clock.   BP at goal.  No dizziness.    Weight is up just a little bit and wonders if it's her shoes.    UTIs--has been treated a few times now.  Went to Dr. Rosana Hoes at [facility] (her request).  First time, urine sample was ok.  He checked inside of her bladder.  It was in good condition.  IT had good support.  He wanted her to see an NP in August.  On 5/20, she thought she was getting an infection, she took a specimen over there and culture showed UTI on 5/22--was on cipro 500mg  daily thru 5/29.  She had a time taking that.  She does feel better now.  She is constantly drinking water and cranberry juice.    She fell out of bed 5/23.  Ankles have been swelling right more than left.  She decided to pull out her "toe pillow" (b/c dr. Doran Durand gave it to her after her toe surgery).  Its slanted and large.  She was using it to elevate her feet.  When she was trying to get up to get oob last Saturday, she thinks her foot got caught in the blanket and the pillow tipped and she did, too, onto the floor 23 inches below.  She thinks there was a little blanket under her.  She had to call for help to get up  herself.  Had scooted herself over to a drawer and could not get up without help or another piece of furniture.  [facility] nurse came to check her, BP was up a little.  She did not have pain.  She then went to a wedding later in the day.  After the wedding, her left side of her neck and shoulders hurt and some other aches and pains.  She didn't take her tylenol that day just to be sure nothing was covered up.  She's been more worried about her balance.  Using her cane more now. She wonders if she needs her lower back xrayed.  she had an mri of her spine with Dr. Nelva Bush, last in 2018 which had a good report (then there was pain down her right hip, no shot was needed). Back is not hurting but she's aware of her lower spine when she was not before (tip of spine).  She feels a little on edge.    Right ankle is swelling more than left.  She's been reading about this and how it can be related to her heart.  Not short of breath.  She thinks it started when she was finishing her second course of cipro.  No chest  pain or palpitations.  She is on her feet a lot, but has not walked quite as much since she fell oob.  She does try to use the nustep in the exercise room for 10 or 15 mins.  She also goes to stretching class on Tuesday at [place] and at [facility] on Saturday.    Did very well with her inguinal hernia surgery.    Went to Dr. Delilah Shan for her left ankle which felt like it would give way when walking outside.  Walked carefully with her cane back inside.  xrays negative.  He proposed a brace.  The next day and after that it was fine so she never actually used the brace.    Past Medical History:  Diagnosis Date  . Cervicalgia   . Chest pain at rest 01/07/2012  . Displacement of cervical intervertebral disc without myelopathy   . Diverticulosis of colon (without mention of hemorrhage)   . Female stress incontinence   . GERD (gastroesophageal reflux disease)   . Inguinal hernia without mention of obstruction or gangrene,  unilateral or unspecified, (not specified as recurrent)   . Internal hemorrhoids without mention of complication   . Lumbago   . Macular degeneration (senile) of retina, unspecified   . Osteoarthrosis, unspecified whether generalized or localized, unspecified site   . Other and unspecified hyperlipidemia   . Pain in joint, hand   . Palpitations   . Reflux esophagitis   . Scoliosis (and kyphoscoliosis), idiopathic   . Senile osteoporosis   . Skin disorder   . Thyroid disease   . Unruptured popliteal cyst 06/24/2014   Right knee   . Unspecified essential hypertension   . Unspecified glaucoma(365.9)   . Unspecified hypothyroidism   . Unspecified vitamin D deficiency     Past Surgical History:  Procedure Laterality Date  . ABDOMINAL HYSTERECTOMY  1975   Dr Mallie Mussel  . APPENDECTOMY  1988  . cardiolite myocardial perfusion study    . DOPPLER ECHOCARDIOGRAPHY    . EYE SURGERY Bilateral 2009   cataract removed right eye, Dr Charise Killian  . FOOT SURGERY  august 2013   Doran Durand, MD  . INGUINAL HERNIA REPAIR Bilateral    w/mesh  . INGUINAL HERNIA REPAIR Left 08/03/2017   Procedure: LAPAROSCOPIC LEFT INGUINAL HERNIA REPAIR WITH MESH;  Surgeon: Greer Pickerel, MD;  Location: [facility];  Service: General;  Laterality: Left;  . INGUINAL HERNIA REPAIR Right 08/03/2017   Procedure: HERNIA REPAIR RIGHT INGUINAL ADULT WITH MESH;  Surgeon: Greer Pickerel, MD;  Location: [facility];  Service: General;  Laterality: Right;  . INSERTION OF MESH Bilateral 08/03/2017   Procedure: INSERTION OF MESH;  Surgeon: Greer Pickerel, MD;  Location: [facility];  Service: General;  Laterality: Bilateral;  . NM MYOVIEW LTD     negative  . TONSILLECTOMY  1937  . TOOTH EXTRACTION  09/16/13   Dr Carlos American    Allergies  Allergen Reactions  . Trimethoprim Other (See Comments)    Headaches/ ear pressure   . Erythromycin     UNSPECIFIED REACTION   . Macrodantin [Nitrofurantoin Macrocrystal]     UNSPECIFIED REACTION   . Penicillins      UNSPECIFIED REACTION  Has patient had a PCN reaction causing immediate rash, facial/tongue/throat swelling, SOB or lightheadedness with hypotension: Unknown Has patient had a PCN reaction causing severe rash involving mucus membranes or skin necrosis: Unknown Has patient had a PCN reaction that required hospitalization: No Has patient had a PCN reaction occurring within the  last 10 years: No If all of the above answers are "NO", then may proceed with Cephalosporin use.  . Sulfa Antibiotics     UNSPECIFIED REACTION   . Latex Itching    Outpatient Encounter Medications as of 10/15/2017  Medication Sig  . acetaminophen (TYLENOL) 500 MG tablet Take 500 mg by mouth 2 (two) times daily as needed for mild pain or headache.   . bacitracin-polymyxin b (POLYSPORIN) ointment Apply 1 application topically daily as needed (wound care).  . bimatoprost (LUMIGAN) 0.01 % SOLN Place 1 drop into both eyes at bedtime.  . calcium elemental as carbonate (TUMS ULTRA 1000) 400 MG chewable tablet Chew 500 mg by mouth daily with lunch.  . Cholecalciferol (VITAMIN D3) 2000 UNITS TABS Take 2,000 Units by mouth 2 (two) times daily.   . clopidogrel (PLAVIX) 75 MG tablet Restart Monday 3/25  . hydrocortisone cream 1 % Apply 1 application topically as needed for itching.  . metoprolol succinate (TOPROL-XL) 25 MG 24 hr tablet Take 1 tablet (25 mg total) by mouth daily.  . Multiple Vitamins-Minerals (PRESERVISION AREDS 2 PO) Take 1 tablet by mouth 2 (two) times daily.   Vladimir Faster Glycol-Propyl Glycol (SYSTANE ULTRA OP) Place 1 vial into both eyes 2 (two) times daily. May use another dose during the day as needed for dry eye  . ranitidine (ZANTAC) 150 MG tablet Take 150 mg by mouth daily as needed for heartburn.   . [DISCONTINUED] cephALEXin (KEFLEX) 500 MG capsule Take 1 capsule (500 mg total) by mouth 2 (two) times daily.   No facility-administered encounter medications on file as of 10/15/2017.     Review of Systems:    Review of Systems  Constitutional: Negative for chills, fever and malaise/fatigue.  Eyes: Negative for blurred vision.       Glasses  Respiratory: Negative for shortness of breath.   Cardiovascular: Positive for leg swelling. Negative for chest pain, palpitations, orthopnea, claudication and PND.  Gastrointestinal: Negative for abdominal pain, blood in stool, constipation, diarrhea, heartburn and melena.  Genitourinary: Positive for frequency. Negative for dysuria and urgency.  Musculoskeletal: Positive for falls.       Out of bed; buttock bothers her a little  Skin: Negative for itching and rash.  Neurological: Negative for dizziness and loss of consciousness.  Endo/Heme/Allergies: Bruises/bleeds easily.  Psychiatric/Behavioral: Negative for depression and memory loss. The patient is not nervous/anxious and does not have insomnia.     Health Maintenance  Topic Date Due  . INFLUENZA VACCINE  12/13/2017  . MAMMOGRAM  04/30/2018  . TETANUS/TDAP  03/20/2020  . DEXA SCAN  Completed  . PNA vac Low Risk Adult  Completed    Physical Exam: Vitals:   10/15/17 1418  BP: (!) 120/58  Pulse: 66  Temp: 97.8 F (36.6 C)  TempSrc: Oral  SpO2: 97%  Weight: 126 lb (57.2 kg)  Height: 5' (1.524 m)   Body mass index is 24.61 kg/m. Physical Exam  Constitutional: She is oriented to person, place, and time. She appears well-developed and well-nourished. No distress.  HENT:  Head: Normocephalic and atraumatic.  Cardiovascular: Normal rate, regular rhythm, normal heart sounds and intact distal pulses.  Pulmonary/Chest: Effort normal and breath sounds normal. No respiratory distress.  Abdominal: Bowel sounds are normal.  Musculoskeletal: Normal range of motion.  Using cane for support; slight tenderness of sacrum/coccyx  Neurological: She is alert and oriented to person, place, and time.  Skin: Skin is warm and dry. Capillary refill takes less than  2 seconds.  Psychiatric: She has a normal  mood and affect. Her behavior is normal. Judgment and thought content normal.    Labs reviewed: Basic Metabolic Panel: Recent Labs    06/12/17 0908 07/26/17 1428 08/03/17 1342 08/04/17 0629  NA 139 138  --  134*  K 4.2 4.2  --  4.3  CL 102 103  --  99*  CO2 31 25  --  24  GLUCOSE 76 88  --  109*  BUN 21 17  --  12  CREATININE 0.68 0.67 0.73 0.70  CALCIUM 9.0 9.0  --  8.5*   Liver Function Tests: Recent Labs    06/12/17 0908 07/26/17 1428  AST 21 24  ALT 16 19  ALKPHOS  --  52  BILITOT 0.5 0.5  PROT 6.5 6.2*  ALBUMIN  --  3.7   No results for input(s): LIPASE, AMYLASE in the last 8760 hours. No results for input(s): AMMONIA in the last 8760 hours. CBC: Recent Labs    06/08/17 2013 06/12/17 0908 07/26/17 1428  WBC 6.0 5.4 7.3  NEUTROABS 3.1 2,549 4.6  HGB 12.9 12.8 12.4  HCT 38.7 37.6 37.7  MCV 93.5 90.0 94.3  PLT 206 231 203   Lipid Panel: Recent Labs    06/12/17 0908  CHOL 190  HDL 86  LDLCALC 85  TRIG 96  CHOLHDL 2.2   Lab Results  Component Value Date   HGBA1C 5.3 05/02/2013     Assessment/Plan 1. Essential hypertension -bp well controlled and w/o dizziness, cont same regimen  2. History of recurrent UTIs -latest resolved after cipro   3. Fall from bed, initial encounter -w/o any major injury; she is slightly uncomfortable over her coccyx, but no difficulty with significant pain and not needing to take medications -had asked if xrays needed, but we decided since she is not having actual pain and there's nothing we would do for a fractured coccyx (would expect significant pain sitting with that and she does not have that), it did not make sense to get imaging -she's fine with this -she is using her cane more for stability due to fear than anything else it seems  4. Chronic venous insufficiency -more notable in ankles since cipro for UTIs -had some left ankle pain that resolved (? Tendonitis from quinolone) -encouraged elevating feet with  pillow but not wrapping with her blanket like she had it when she fell out of bed -also recommended wearing her compression hose that she has on in the morning and off at bedtime  Labs/tests ordered:  No orders of the defined types were placed in this encounter.  Next appt:  02/25/2018 med mgt, may do labs day of appt   Sugey Trevathan L. Simcha Farrington, D.O. [facility] [address],  [address] Cell Phone (Mon-Fri 8am-5pm):  [phone] On Call:  [phone] & follow prompts after 5pm & weekends Office Phone:  [phone] Office Fax:  [phone]

## 2017-10-15 NOTE — Patient Instructions (Addendum)
Wear your compression hose in the daytime to help the swelling.  Call me if that does not help, the swelling worsens, or you become short of breath.

## 2017-10-15 NOTE — Progress Notes (Signed)
Subjective:   [name] is a 84 y.o. female who presents for Medicare Annual (Subsequent) preventive examination.  Last AWV-06/27/2016    Objective:     Vitals: BP (!) 120/58 (BP Location: Left Arm, Patient Position: Sitting)   Pulse 66   Temp 97.8 F (36.6 C) (Oral)   Ht 5' (1.524 m)   Wt 126 lb (57.2 kg)   SpO2 97%   BMI 24.61 kg/m   Body mass index is 24.61 kg/m.  Advanced Directives 10/15/2017 08/20/2017 08/03/2017 07/26/2017 06/08/2017 06/07/2017 02/01/2017  Does Patient Have a Medical Advance Directive? Yes Yes Yes Yes Yes Yes Yes  Type of Paramedic of [place];Living will;Out of facility DNR (pink MOST or yellow form) [place];Living will;Out of facility DNR (pink MOST or yellow form) [place];Living will [place];Living will [place];Living will [place];Living will;Out of facility DNR (pink MOST or yellow form) Living will;Out of facility DNR (pink MOST or yellow form)  Does patient want to make changes to medical advance directive? No - Patient declined No - Patient declined No - Patient declined - No - Patient declined No - Patient declined -  Copy of [place] in Chart? Yes Yes Yes No - copy requested Yes Yes -  Pre-existing out of facility DNR order (yellow form or pink MOST form) Yellow form placed in chart (order not valid for inpatient use) Yellow form placed in chart (order not valid for inpatient use) - - - Yellow form placed in chart (order not valid for inpatient use) Yellow form placed in chart (order not valid for inpatient use)    Tobacco Social History   Tobacco Use  Smoking Status Never Smoker  Smokeless Tobacco Never Used     Counseling given: Not Answered   Clinical Intake:  Pre-visit preparation completed: No  Pain : No/denies pain     Nutritional Risks: None Diabetes: No  How often do you need  to have someone help you when you read instructions, pamphlets, or other written materials from your doctor or pharmacy?: 1 - Never What is the last grade level you completed in school?: College  Interpreter Needed?: No  Information entered by :: [name], RN  Past Medical History:  Diagnosis Date  . Cervicalgia   . Chest pain at rest 01/07/2012  . Displacement of cervical intervertebral disc without myelopathy   . Diverticulosis of colon (without mention of hemorrhage)   . Female stress incontinence   . GERD (gastroesophageal reflux disease)   . Inguinal hernia without mention of obstruction or gangrene, unilateral or unspecified, (not specified as recurrent)   . Internal hemorrhoids without mention of complication   . Lumbago   . Macular degeneration (senile) of retina, unspecified   . Osteoarthrosis, unspecified whether generalized or localized, unspecified site   . Other and unspecified hyperlipidemia   . Pain in joint, hand   . Palpitations   . Reflux esophagitis   . Scoliosis (and kyphoscoliosis), idiopathic   . Senile osteoporosis   . Skin disorder   . Thyroid disease   . Unruptured popliteal cyst 06/24/2014   Right knee   . Unspecified essential hypertension   . Unspecified glaucoma(365.9)   . Unspecified hypothyroidism   . Unspecified vitamin D deficiency    Past Surgical History:  Procedure Laterality Date  . ABDOMINAL HYSTERECTOMY  1975   Dr Mallie Mussel  . APPENDECTOMY  1988  .  cardiolite myocardial perfusion study    . DOPPLER ECHOCARDIOGRAPHY    . EYE SURGERY Bilateral 2009   cataract removed right eye, Dr Charise Killian  . FOOT SURGERY  august 2013   Doran Durand, MD  . INGUINAL HERNIA REPAIR Bilateral    w/mesh  . INGUINAL HERNIA REPAIR Left 08/03/2017   Procedure: LAPAROSCOPIC LEFT INGUINAL HERNIA REPAIR WITH MESH;  Surgeon: Greer Pickerel, MD;  Location: [facility];  Service: General;  Laterality: Left;  . INGUINAL HERNIA REPAIR Right 08/03/2017   Procedure: HERNIA REPAIR  RIGHT INGUINAL ADULT WITH MESH;  Surgeon: Greer Pickerel, MD;  Location: [facility];  Service: General;  Laterality: Right;  . INSERTION OF MESH Bilateral 08/03/2017   Procedure: INSERTION OF MESH;  Surgeon: Greer Pickerel, MD;  Location: [facility];  Service: General;  Laterality: Bilateral;  . NM MYOVIEW LTD     negative  . TONSILLECTOMY  1937  . TOOTH EXTRACTION  09/16/13   Dr Carlos American   Family History  Problem Relation Age of Onset  . CAD Mother   . Parkinson's disease Mother   . Cancer Father        colon cancer  . Parkinson's disease Father   . Stroke Maternal Grandmother    Social History   Socioeconomic History  . Marital status: Widowed    Spouse name: Not on file  . Number of children: 2  . Years of education: college  . Highest education level: Not on file  Occupational History    Comment: retired  Scientific laboratory technician  . Financial resource strain: Not hard at all  . Food insecurity:    Worry: Never true    Inability: Never true  . Transportation needs:    Medical: No    Non-medical: No  Tobacco Use  . Smoking status: Never Smoker  . Smokeless tobacco: Never Used  Substance and Sexual Activity  . Alcohol use: No  . Drug use: No  . Sexual activity: Never  Lifestyle  . Physical activity:    Days per week: 3 days    Minutes per session: 30 min  . Stress: Only a little  Relationships  . Social connections:    Talks on phone: More than three times a week    Gets together: More than three times a week    Attends religious service: More than 4 times per year    Active member of club or organization: Yes    Attends meetings of clubs or organizations: More than 4 times per year    Relationship status: Widowed  Other Topics Concern  . Not on file  Social History Narrative   Patient lives at home with her daughter [name]) , moved to [facility] 02/02/16 [place]    Widowed.   Retired.   EducationNurse, mental health.   Right handed.   Caffeine- None some times tea very rare.   Never  smoked   Alcohol none    Outpatient Encounter Medications as of 10/15/2017  Medication Sig  . acetaminophen (TYLENOL) 500 MG tablet Take 500 mg by mouth 2 (two) times daily as needed for mild pain or headache.   . bacitracin-polymyxin b (POLYSPORIN) ointment Apply 1 application topically daily as needed (wound care).  . bimatoprost (LUMIGAN) 0.01 % SOLN Place 1 drop into both eyes at bedtime.  . calcium elemental as carbonate (TUMS ULTRA 1000) 400 MG chewable tablet Chew 500 mg by mouth daily with lunch.  . Cholecalciferol (VITAMIN D3) 2000 UNITS TABS Take 2,000 Units by  mouth 2 (two) times daily.   . clopidogrel (PLAVIX) 75 MG tablet Restart Monday 3/25  . hydrocortisone cream 1 % Apply 1 application topically as needed for itching.  . metoprolol succinate (TOPROL-XL) 25 MG 24 hr tablet Take 1 tablet (25 mg total) by mouth daily.  . Multiple Vitamins-Minerals (PRESERVISION AREDS 2 PO) Take 1 tablet by mouth 2 (two) times daily.   Vladimir Faster Glycol-Propyl Glycol (SYSTANE ULTRA OP) Place 1 vial into both eyes 2 (two) times daily. May use another dose during the day as needed for dry eye  . ranitidine (ZANTAC) 150 MG tablet Take 150 mg by mouth daily as needed for heartburn.   . cephALEXin (KEFLEX) 500 MG capsule Take 1 capsule (500 mg total) by mouth 2 (two) times daily.   No facility-administered encounter medications on file as of 10/15/2017.     Activities of Daily Living In your present state of health, do you have any difficulty performing the following activities: 10/15/2017 08/03/2017  Hearing? N -  Vision? N -  Difficulty concentrating or making decisions? N -  Walking or climbing stairs? Y -  Dressing or bathing? N -  Doing errands, shopping? N N  Preparing Food and eating ? N -  Using the Toilet? N -  In the past six months, have you accidently leaked urine? Y -  Comment wears pads -  Do you have problems with loss of bowel control? Y -  Comment small amounts leaking -    Managing your Medications? N -  Managing your Finances? N -  Housekeeping or managing your Housekeeping? N -  Some recent data might be hidden    Patient Care Team: Gayland Curry, DO as PCP - General (Geriatric Medicine) Lorretta Harp, MD as PCP - Cardiology (Cardiology) Laurence Spates, MD as Consulting Physician (Gastroenterology) Myrlene Broker, MD as Attending Physician (Urology) Lorretta Harp, MD as Consulting Physician (Cardiology) Rozetta Nunnery, MD as Consulting Physician (Otolaryngology) Richarda Osmond, DDS (Dentistry) Shon Hough, MD as Consulting Physician (Ophthalmology) Iran Planas, MD as Consulting Physician (Orthopedic Surgery) Greer Pickerel, MD as Consulting Physician (General Surgery)    Assessment:   This is a routine wellness examination for [facility].  Exercise Activities and Dietary recommendations Current Exercise Habits: Structured exercise class, Type of exercise: Other - see comments;stretching(balance class), Time (Minutes): 30, Frequency (Times/Week): 3, Weekly Exercise (Minutes/Week): 90, Intensity: Mild, Exercise limited by: None identified  Goals    None      Fall Risk Fall Risk  10/15/2017 08/30/2017 06/07/2017 02/01/2017 06/27/2016  Falls in the past year? Yes No No No No  Number falls in past yr: 2 or more - - - -  Comment - - - - -  Injury with Fall? No - - - -  Risk for fall due to : - - - - -  Follow up - - - - -   Is the patient's home free of loose throw rugs in walkways, pet beds, electrical cords, etc?   yes      Grab bars in the bathroom? yes      Handrails on the stairs?   yes      Adequate lighting?   yes  Timed Get Up and Go performed: 19 seconds  Depression Screen PHQ 2/9 Scores 10/15/2017 06/07/2017 02/01/2017 06/27/2016  PHQ - 2 Score 0 0 0 0     Cognitive Function MMSE - Mini Mental State Exam 10/15/2017 06/27/2016 06/22/2015 06/24/2014  Not  completed: - - (No Data) -  Orientation to time 5 5 5 5    Orientation to Place 5 5 5 5   Registration 3 3 3 3   Attention/ Calculation 5 5 5 5   Recall 3 1 1 1   Language- name 2 objects 2 2 2 2   Language- repeat 1 1 1 1   Language- follow 3 step command 3 3 3 3   Language- read & follow direction 1 1 1 1   Write a sentence 1 1 1 1   Copy design 1 1 1 1   Total score 30 28 28 28         Immunization History  Administered Date(s) Administered  . Influenza Whole 02/24/2011, 02/12/2012  . Influenza, High Dose Seasonal PF 02/01/2017  . Influenza,inj,Quad PF,6+ Mos 02/20/2013, 02/10/2014, 02/19/2015, 02/18/2016  . Pneumococcal Conjugate-13 06/24/2014  . Pneumococcal Polysaccharide-23 04/12/1998  . Td 02/21/1996, 07/23/2003  . Tdap 03/20/2010  . Zoster 09/15/2005    Qualifies for Shingles Vaccine? Up to date. Is going to call when she gets home with the dates of her Shingrix shots.  Screening Tests Health Maintenance  Topic Date Due  . INFLUENZA VACCINE  12/13/2017  . MAMMOGRAM  04/30/2018  . TETANUS/TDAP  03/20/2020  . DEXA SCAN  Completed  . PNA vac Low Risk Adult  Completed    Cancer Screenings: Lung: Low Dose CT Chest recommended if Age 50-80 years, 30 pack-year currently smoking OR have quit w/in 15years. Patient does not qualify. Breast:  Up to date on Mammogram? Yes   Up to date of Bone Density/Dexa? Yes Colorectal: up to date  Additional Screenings: Hepatitis C Screening: declined     Plan:  I have personally reviewed and addressed the Medicare Annual Wellness questionnaire and have noted the following in the patient's chart:  A. Medical and social history B. Use of alcohol, tobacco or illicit drugs  C. Current medications and supplements D. Functional ability and status E.  Nutritional status F.  Physical activity G. Advance directives H. List of other physicians I.  Hospitalizations, surgeries, and ER visits in previous 12 months J.  [place] to include hearing, vision, cognitive, depression L. Referrals  and appointments - none  In addition, I have reviewed and discussed with patient certain preventive protocols, quality metrics, and best practice recommendations. A written personalized care plan for preventive services as well as general preventive health recommendations were provided to patient.  See attached scanned questionnaire for additional information.   Signed,   [name], RN Nurse Health Advisor  Patient Concerns: [facility] out of bed last month and unsure if she needs any follow up. Swollen ankles since taking the Keflex

## 2017-10-15 NOTE — Patient Instructions (Signed)
[name] , Thank you for taking time to come for your Medicare Wellness Visit. I appreciate your ongoing commitment to your health goals. Please review the following plan we discussed and let me know if I can assist you in the future.   Screening recommendations/referrals: Colonoscopy excluded, over age 82 Mammogram excluded, over age 3 Bone Density up to date Recommended yearly ophthalmology/optometry visit for glaucoma screening and checkup Recommended yearly dental visit for hygiene and checkup  Vaccinations: Influenza vaccine up to date, due 2019 fall season Pneumococcal vaccine up to date, completed Tdap vaccine up to date, due 03/20/2020 Shingles vaccine up to date. Please call us with the dates of your 2 Shingrix shots    Advanced directives: In chart  Conditions/risks identified: none  Next appointment: Meredith Dense, RN 10/18/2018 @ 12:45pm   Preventive Care 65 Years and Older, Female Preventive care refers to lifestyle choices and visits with your health care provider that can promote health and wellness. What does preventive care include?  A yearly physical exam. This is also called an annual well check.  Dental exams once or twice a year.  Routine eye exams. Ask your health care provider how often you should have your eyes checked.  Personal lifestyle choices, including:  Daily care of your teeth and gums.  Regular physical activity.  Eating a healthy diet.  Avoiding tobacco and drug use.  Limiting alcohol use.  Practicing safe sex.  Taking low-dose aspirin every day.  Taking vitamin and mineral supplements as recommended by your health care provider. What happens during an annual well check? The services and screenings done by your health care provider during your annual well check will depend on your age, overall health, lifestyle risk factors, and family history of disease. Counseling  Your health care provider may ask you questions about  your:  Alcohol use.  Tobacco use.  Drug use.  Emotional well-being.  Home and relationship well-being.  Sexual activity.  Eating habits.  History of falls.  Memory and ability to understand (cognition).  Work and work Statistician.  Reproductive health. Screening  You may have the following tests or measurements:  Height, weight, and BMI.  Blood pressure.  Lipid and cholesterol levels. These may be checked every 5 years, or more frequently if you are over 52 years old.  Skin check.  Lung cancer screening. You may have this screening every year starting at age 41 if you have a 30-pack-year history of smoking and currently smoke or have quit within the past 15 years.  Fecal occult blood test (FOBT) of the stool. You may have this test every year starting at age 9.  Flexible sigmoidoscopy or colonoscopy. You may have a sigmoidoscopy every 5 years or a colonoscopy every 10 years starting at age 61.  Hepatitis C blood test.  Hepatitis B blood test.  Sexually transmitted disease (STD) testing.  Diabetes screening. This is done by checking your blood sugar (glucose) after you have not eaten for a while (fasting). You may have this done every 1-3 years.  Bone density scan. This is done to screen for osteoporosis. You may have this done starting at age 88.  Mammogram. This may be done every 1-2 years. Talk to your health care provider about how often you should have regular mammograms. Talk with your health care provider about your test results, treatment options, and if necessary, the need for more tests. Vaccines  Your health care provider may recommend certain vaccines, such as:  Influenza vaccine.  This is recommended every year.  Tetanus, diphtheria, and acellular pertussis (Tdap, Td) vaccine. You may need a Td booster every 10 years.  Zoster vaccine. You may need this after age 68.  Pneumococcal 13-valent conjugate (PCV13) vaccine. One dose is recommended  after age 76.  Pneumococcal polysaccharide (PPSV23) vaccine. One dose is recommended after age 20. Talk to your health care provider about which screenings and vaccines you need and how often you need them. This information is not intended to replace advice given to you by your health care provider. Make sure you discuss any questions you have with your health care provider. Document Released: 05/28/2015 Document Revised: 01/19/2016 Document Reviewed: 03/02/2015 Elsevier Interactive Patient Education  2017 [place] Prevention in the Home Falls can cause injuries. They can happen to people of all ages. There are many things you can do to make your home safe and to help prevent falls. What can I do on the outside of my home?  Regularly fix the edges of walkways and driveways and fix any cracks.  Remove anything that might make you trip as you walk through a door, such as a raised step or threshold.  Trim any bushes or trees on the path to your home.  Use bright outdoor lighting.  Clear any walking paths of anything that might make someone trip, such as rocks or tools.  Regularly check to see if handrails are loose or broken. Make sure that both sides of any steps have handrails.  Any raised decks and porches should have guardrails on the edges.  Have any leaves, snow, or ice cleared regularly.  Use sand or salt on walking paths during winter.  Clean up any spills in your garage right away. This includes oil or grease spills. What can I do in the bathroom?  Use night lights.  Install grab bars by the toilet and in the tub and shower. Do not use towel bars as grab bars.  Use non-skid mats or decals in the tub or shower.  If you need to sit down in the shower, use a plastic, non-slip stool.  Keep the floor dry. Clean up any water that spills on the floor as soon as it happens.  Remove soap buildup in the tub or shower regularly.  Attach bath mats securely with  double-sided non-slip rug tape.  Do not have throw rugs and other things on the floor that can make you trip. What can I do in the bedroom?  Use night lights.  Make sure that you have a light by your bed that is easy to reach.  Do not use any sheets or blankets that are too big for your bed. They should not hang down onto the floor.  Have a firm chair that has side arms. You can use this for support while you get dressed.  Do not have throw rugs and other things on the floor that can make you trip. What can I do in the kitchen?  Clean up any spills right away.  Avoid walking on wet floors.  Keep items that you use a lot in easy-to-reach places.  If you need to reach something above you, use a strong step stool that has a grab bar.  Keep electrical cords out of the way.  Do not use floor polish or wax that makes floors slippery. If you must use wax, use non-skid floor wax.  Do not have throw rugs and other things on the floor that can make  you trip. What can I do with my stairs?  Do not leave any items on the stairs.  Make sure that there are handrails on both sides of the stairs and use them. Fix handrails that are broken or loose. Make sure that handrails are as long as the stairways.  Check any carpeting to make sure that it is firmly attached to the stairs. Fix any carpet that is loose or worn.  Avoid having throw rugs at the top or bottom of the stairs. If you do have throw rugs, attach them to the floor with carpet tape.  Make sure that you have a light switch at the top of the stairs and the bottom of the stairs. If you do not have them, ask someone to add them for you. What else can I do to help prevent falls?  Wear shoes that:  Do not have high heels.  Have rubber bottoms.  Are comfortable and fit you well.  Are closed at the toe. Do not wear sandals.  If you use a stepladder:  Make sure that it is fully opened. Do not climb a closed stepladder.  Make  sure that both sides of the stepladder are locked into place.  Ask someone to hold it for you, if possible.  Clearly mark and make sure that you can see:  Any grab bars or handrails.  First and last steps.  Where the edge of each step is.  Use tools that help you move around (mobility aids) if they are needed. These include:  Canes.  Walkers.  Scooters.  Crutches.  Turn on the lights when you go into a dark area. Replace any light bulbs as soon as they burn out.  Set up your furniture so you have a clear path. Avoid moving your furniture around.  If any of your floors are uneven, fix them.  If there are any pets around you, be aware of where they are.  Review your medicines with your doctor. Some medicines can make you feel dizzy. This can increase your chance of falling. Ask your doctor what other things that you can do to help prevent falls. This information is not intended to replace advice given to you by your health care provider. Make sure you discuss any questions you have with your health care provider. Document Released: 02/25/2009 Document Revised: 10/07/2015 Document Reviewed: 06/05/2014 Elsevier Interactive Patient Education  2017 [employer].

## 2017-10-17 ENCOUNTER — Ambulatory Visit: Payer: Self-pay

## 2017-10-17 DIAGNOSIS — M79672 Pain in left foot: Secondary | ICD-10-CM | POA: Diagnosis not present

## 2017-10-17 DIAGNOSIS — S93602A Unspecified sprain of left foot, initial encounter: Secondary | ICD-10-CM | POA: Diagnosis not present

## 2017-10-26 DIAGNOSIS — M546 Pain in thoracic spine: Secondary | ICD-10-CM | POA: Diagnosis not present

## 2017-10-26 DIAGNOSIS — M542 Cervicalgia: Secondary | ICD-10-CM | POA: Diagnosis not present

## 2017-10-29 ENCOUNTER — Encounter: Payer: Self-pay | Admitting: Internal Medicine

## 2017-10-29 ENCOUNTER — Non-Acute Institutional Stay: Payer: Medicare Other | Admitting: Internal Medicine

## 2017-10-29 VITALS — BP 138/84 | HR 63 | Temp 97.8°F | Resp 14 | Ht 60.0 in | Wt 128.2 lb

## 2017-10-29 DIAGNOSIS — R21 Rash and other nonspecific skin eruption: Secondary | ICD-10-CM

## 2017-10-29 NOTE — Progress Notes (Signed)
[facility]  Provider: Blanchie Serve MD   Location:  [place] of Service:  Clinic (12)  PCP: Gayland Curry, DO Patient Care Team: Gayland Curry, DO as PCP - General (Geriatric Medicine) Lorretta Harp, MD as PCP - Cardiology (Cardiology) Laurence Spates, MD as Consulting Physician (Gastroenterology) Myrlene Broker, MD as Attending Physician (Urology) Lorretta Harp, MD as Consulting Physician (Cardiology) Rozetta Nunnery, MD as Consulting Physician (Otolaryngology) Richarda Osmond, DDS (Dentistry) Shon Hough, MD as Consulting Physician (Ophthalmology) Iran Planas, MD as Consulting Physician (Orthopedic Surgery) Greer Pickerel, MD as Consulting Physician (General Surgery)  Extended Emergency Contact Information Primary Emergency Contact: [name] Address: [address] [place], [place] [address] [name] of [place] Work Phone: [phone] Mobile Phone: [phone] Relation: Daughter Secondary Emergency Contact: Arvin Collard CT [place] [employer] Phone: [phone] Relation: Son  Goals of Care: Advanced Directive information Advanced Directives 10/29/2017  Does Patient Have a Medical Advance Directive? Yes  Type of Paramedic of [place];Living will;Out of facility DNR (pink MOST or yellow form)  Does patient want to make changes to medical advance directive? -  Copy of [place] in Chart? Yes  Pre-existing out of facility DNR order (yellow form or pink MOST form) Yellow form placed in chart (order not valid for inpatient use)      Chief Complaint  Patient presents with  . Acute Visit    itchy rash on left hip    HPI: Patient is a 84 y.o. female seen today for acute visit. She is here with itching to left hip on and off for 1 week it is more prominent in evening time. She tends to scratch it. Denies bleeding or drainage from it.  Denies pain. She has applied lotion to the area and hydrocortisone cream to the area. Hydrocortisone cream has helped some, she applied them last night and today the itching is minimal.   Past Medical History:  Diagnosis Date  . Cervicalgia   . Chest pain at rest 01/07/2012  . Displacement of cervical intervertebral disc without myelopathy   . Diverticulosis of colon (without mention of hemorrhage)   . Female stress incontinence   . GERD (gastroesophageal reflux disease)   . Inguinal hernia without mention of obstruction or gangrene, unilateral or unspecified, (not specified as recurrent)   . Internal hemorrhoids without mention of complication   . Lumbago   . Macular degeneration (senile) of retina, unspecified   . Osteoarthrosis, unspecified whether generalized or localized, unspecified site   . Other and unspecified hyperlipidemia   . Pain in joint, hand   . Palpitations   . Reflux esophagitis   . Scoliosis (and kyphoscoliosis), idiopathic   . Senile osteoporosis   . Skin disorder   . Thyroid disease   . Unruptured popliteal cyst 06/24/2014   Right knee   . Unspecified essential hypertension   . Unspecified glaucoma(365.9)   . Unspecified hypothyroidism   . Unspecified vitamin D deficiency    Past Surgical History:  Procedure Laterality Date  . ABDOMINAL HYSTERECTOMY  1975   Dr Mallie Mussel  . APPENDECTOMY  1988  . cardiolite myocardial perfusion study    . DOPPLER ECHOCARDIOGRAPHY    . EYE SURGERY Bilateral 2009   cataract removed right eye, Dr Charise Killian  . FOOT SURGERY  august 2013   Doran Durand, MD  .  INGUINAL HERNIA REPAIR Bilateral    w/mesh  . INGUINAL HERNIA REPAIR Left 08/03/2017   Procedure: LAPAROSCOPIC LEFT INGUINAL HERNIA REPAIR WITH MESH;  Surgeon: Greer Pickerel, MD;  Location: [place];  Service: General;  Laterality: Left;  . INGUINAL HERNIA REPAIR Right 08/03/2017   Procedure: HERNIA REPAIR RIGHT INGUINAL ADULT WITH MESH;  Surgeon: Greer Pickerel, MD;  Location: [facility];   Service: General;  Laterality: Right;  . INSERTION OF MESH Bilateral 08/03/2017   Procedure: INSERTION OF MESH;  Surgeon: Greer Pickerel, MD;  Location: [facility];  Service: General;  Laterality: Bilateral;  . NM MYOVIEW LTD     negative  . TONSILLECTOMY  1937  . TOOTH EXTRACTION  09/16/13   Dr Carlos American    reports that she has never smoked. She has never used smokeless tobacco. She reports that she does not drink alcohol or use drugs. Social History   Socioeconomic History  . Marital status: Widowed    Spouse name: Not on file  . Number of children: 2  . Years of education: college  . Highest education level: Not on file  Occupational History    Comment: retired  Scientific laboratory technician  . Financial resource strain: Not hard at all  . Food insecurity:    Worry: Never true    Inability: Never true  . Transportation needs:    Medical: No    Non-medical: No  Tobacco Use  . Smoking status: Never Smoker  . Smokeless tobacco: Never Used  Substance and Sexual Activity  . Alcohol use: No  . Drug use: No  . Sexual activity: Never  Lifestyle  . Physical activity:    Days per week: 3 days    Minutes per session: 30 min  . Stress: Only a little  Relationships  . Social connections:    Talks on phone: More than three times a week    Gets together: More than three times a week    Attends religious service: More than 4 times per year    Active member of club or organization: Yes    Attends meetings of clubs or organizations: More than 4 times per year    Relationship status: Widowed  . Intimate partner violence:    Fear of current or ex partner: No    Emotionally abused: No    Physically abused: No    Forced sexual activity: No  Other Topics Concern  . Not on file  Social History Narrative   Patient lives at home with her daughter [name]) , moved to [facility] 02/02/16 [place]    Widowed.   Retired.   EducationNurse, mental health.   Right handed.   Caffeine- None some times tea very rare.    Never smoked   Alcohol none     Family History  Problem Relation Age of Onset  . CAD Mother   . Parkinson's disease Mother   . Cancer Father        colon cancer  . Parkinson's disease Father   . Stroke Maternal Grandmother     Health Maintenance  Topic Date Due  . INFLUENZA VACCINE  12/13/2017  . MAMMOGRAM  04/30/2018  . TETANUS/TDAP  03/20/2020  . DEXA SCAN  Completed  . PNA vac Low Risk Adult  Completed    Allergies  Allergen Reactions  . Trimethoprim Other (See Comments)    Headaches/ ear pressure   . Erythromycin     UNSPECIFIED REACTION   . Macrodantin [Nitrofurantoin Macrocrystal]  UNSPECIFIED REACTION   . Penicillins     UNSPECIFIED REACTION  Has patient had a PCN reaction causing immediate rash, facial/tongue/throat swelling, SOB or lightheadedness with hypotension: Unknown Has patient had a PCN reaction causing severe rash involving mucus membranes or skin necrosis: Unknown Has patient had a PCN reaction that required hospitalization: No Has patient had a PCN reaction occurring within the last 10 years: No If all of the above answers are "NO", then may proceed with Cephalosporin use.  . Sulfa Antibiotics     UNSPECIFIED REACTION   . Latex Itching    Outpatient Encounter Medications as of 10/29/2017  Medication Sig  . acetaminophen (TYLENOL) 500 MG tablet Take 500 mg by mouth 2 (two) times daily as needed for mild pain or headache.   . bacitracin-polymyxin b (POLYSPORIN) ointment Apply 1 application topically daily as needed (wound care).  . bimatoprost (LUMIGAN) 0.01 % SOLN Place 1 drop into both eyes at bedtime.  . calcium elemental as carbonate (TUMS ULTRA 1000) 400 MG chewable tablet Chew 500 mg by mouth daily with lunch.  . Cholecalciferol (VITAMIN D3) 2000 UNITS TABS Take 2,000 Units by mouth 2 (two) times daily.   . clopidogrel (PLAVIX) 75 MG tablet Restart Monday 3/25  . hydrocortisone cream 1 % Apply 1 application topically as needed for itching.   . metoprolol succinate (TOPROL-XL) 25 MG 24 hr tablet Take 1 tablet (25 mg total) by mouth daily.  . Multiple Vitamins-Minerals (PRESERVISION AREDS 2 PO) Take 1 tablet by mouth 2 (two) times daily.   Vladimir Faster Glycol-Propyl Glycol (SYSTANE ULTRA OP) Place 1 vial into both eyes 2 (two) times daily. May use another dose during the day as needed for dry eye  . ranitidine (ZANTAC) 150 MG tablet Take 150 mg by mouth daily as needed for heartburn.    No facility-administered encounter medications on file as of 10/29/2017.     Review of Systems  Constitutional: Negative for appetite change, chills and fever.  Respiratory: Negative for shortness of breath.   Cardiovascular: Negative for chest pain.  Gastrointestinal: Negative for nausea and vomiting.  Skin: Positive for rash. Negative for wound.  Neurological: Negative for dizziness.    Vitals:   10/29/17 1326  BP: 138/84  Pulse: 63  Resp: 14  Temp: 97.8 F (36.6 C)  TempSrc: Oral  SpO2: 98%  Weight: 128 lb 3.2 oz (58.2 kg)  Height: 5' (1.524 m)   Body mass index is 25.04 kg/m. Physical Exam  Constitutional: She is oriented to person, place, and time. She appears well-developed and well-nourished. No distress.  HENT:  Head: Normocephalic and atraumatic.  Eyes: Conjunctivae are normal.  Neck: Normal range of motion. Neck supple.  Pulmonary/Chest: Effort normal and breath sounds normal.  Abdominal: Soft.  Lymphadenopathy:    She has no cervical adenopathy.  Neurological: She is alert and oriented to person, place, and time.  Skin: Skin is warm and dry. She is not diaphoretic.   excoriation mark to left buttock area with some erythematous papules noted. No drainage. Non tender. Normal temperature of skin area. No rash elsewhere   Psychiatric: She has a normal mood and affect.    Labs reviewed: Basic Metabolic Panel: Recent Labs    06/12/17 0908 07/26/17 1428 08/03/17 1342 08/04/17 0629  NA 139 138  --  134*  K 4.2 4.2   --  4.3  CL 102 103  --  99*  CO2 31 25  --  24  GLUCOSE 76 88  --  109*  BUN 21 17  --  12  CREATININE 0.68 0.67 0.73 0.70  CALCIUM 9.0 9.0  --  8.5*   Liver Function Tests: Recent Labs    06/12/17 0908 07/26/17 1428  AST 21 24  ALT 16 19  ALKPHOS  --  52  BILITOT 0.5 0.5  PROT 6.5 6.2*  ALBUMIN  --  3.7   No results for input(s): LIPASE, AMYLASE in the last 8760 hours. No results for input(s): AMMONIA in the last 8760 hours. CBC: Recent Labs    06/08/17 2013 06/12/17 0908 07/26/17 1428  WBC 6.0 5.4 7.3  NEUTROABS 3.1 2,549 4.6  HGB 12.9 12.8 12.4  HCT 38.7 37.6 37.7  MCV 93.5 90.0 94.3  PLT 206 231 203   Cardiac Enzymes: No results for input(s): CKTOTAL, CKMB, CKMBINDEX, TROPONINI in the last 8760 hours. BNP: Invalid input(s): POCBNP Lab Results  Component Value Date   HGBA1C 5.3 05/02/2013   Lab Results  Component Value Date   TSH 3.87 06/23/2016   No results found for: VITAMINB12 No results found for: FOLATE No results found for: IRON, TIBC, FERRITIN  Lipid Panel: Recent Labs    06/12/17 0908  CHOL 190  HDL 86  LDLCALC 85  TRIG 96  CHOLHDL 2.2   Lab Results  Component Value Date   HGBA1C 5.3 05/02/2013    Procedures since last visit: No results found.  Assessment/Plan  1. Rash and nonspecific skin eruption erythematous papules to left buttock area, non tender, no signs of infection, itching has subsided with hydrocortisone cream. Advised to continue hydrocortisone cream 1% bid for 5 days, reassess if no improvement. Keep skin area clean and dry    Labs/tests ordered:  none  Next appointment: has f/u with Dr Mariea Clonts  Communication: reviewed care plan with patient     Blanchie Serve, MD Internal Medicine [place], [place] [address] Cell Phone (Monday-Friday 8 am - 5 pm): [phone] On Call: [phone] and follow prompts after 5 pm and on weekends Office Phone:  [phone] Office Fax: [phone]

## 2017-10-29 NOTE — Patient Instructions (Signed)
  Apply hydrocortisone cream 1% twice a day to left buttock area for your rash and itching for 5 days. This should help. If no improvement or your itching and rash worsens/ spreads further, let my office know.

## 2017-11-14 DIAGNOSIS — M25572 Pain in left ankle and joints of left foot: Secondary | ICD-10-CM | POA: Diagnosis not present

## 2017-11-14 DIAGNOSIS — M79672 Pain in left foot: Secondary | ICD-10-CM | POA: Diagnosis not present

## 2017-11-14 DIAGNOSIS — S93602A Unspecified sprain of left foot, initial encounter: Secondary | ICD-10-CM | POA: Diagnosis not present

## 2017-11-21 DIAGNOSIS — H43813 Vitreous degeneration, bilateral: Secondary | ICD-10-CM | POA: Diagnosis not present

## 2017-11-21 DIAGNOSIS — H353132 Nonexudative age-related macular degeneration, bilateral, intermediate dry stage: Secondary | ICD-10-CM | POA: Diagnosis not present

## 2017-11-23 DIAGNOSIS — R2681 Unsteadiness on feet: Secondary | ICD-10-CM | POA: Diagnosis not present

## 2017-11-23 DIAGNOSIS — M6281 Muscle weakness (generalized): Secondary | ICD-10-CM | POA: Diagnosis not present

## 2017-11-23 DIAGNOSIS — S93602D Unspecified sprain of left foot, subsequent encounter: Secondary | ICD-10-CM | POA: Diagnosis not present

## 2017-11-29 DIAGNOSIS — M6281 Muscle weakness (generalized): Secondary | ICD-10-CM | POA: Diagnosis not present

## 2017-11-29 DIAGNOSIS — S93602D Unspecified sprain of left foot, subsequent encounter: Secondary | ICD-10-CM | POA: Diagnosis not present

## 2017-11-29 DIAGNOSIS — R2681 Unsteadiness on feet: Secondary | ICD-10-CM | POA: Diagnosis not present

## 2017-11-30 DIAGNOSIS — S93602D Unspecified sprain of left foot, subsequent encounter: Secondary | ICD-10-CM | POA: Diagnosis not present

## 2017-11-30 DIAGNOSIS — R2681 Unsteadiness on feet: Secondary | ICD-10-CM | POA: Diagnosis not present

## 2017-11-30 DIAGNOSIS — M6281 Muscle weakness (generalized): Secondary | ICD-10-CM | POA: Diagnosis not present

## 2017-12-05 DIAGNOSIS — R2681 Unsteadiness on feet: Secondary | ICD-10-CM | POA: Diagnosis not present

## 2017-12-05 DIAGNOSIS — S93602D Unspecified sprain of left foot, subsequent encounter: Secondary | ICD-10-CM | POA: Diagnosis not present

## 2017-12-05 DIAGNOSIS — M6281 Muscle weakness (generalized): Secondary | ICD-10-CM | POA: Diagnosis not present

## 2017-12-06 DIAGNOSIS — M6281 Muscle weakness (generalized): Secondary | ICD-10-CM | POA: Diagnosis not present

## 2017-12-06 DIAGNOSIS — R2681 Unsteadiness on feet: Secondary | ICD-10-CM | POA: Diagnosis not present

## 2017-12-06 DIAGNOSIS — S93602D Unspecified sprain of left foot, subsequent encounter: Secondary | ICD-10-CM | POA: Diagnosis not present

## 2017-12-12 DIAGNOSIS — S93602D Unspecified sprain of left foot, subsequent encounter: Secondary | ICD-10-CM | POA: Diagnosis not present

## 2017-12-12 DIAGNOSIS — M6281 Muscle weakness (generalized): Secondary | ICD-10-CM | POA: Diagnosis not present

## 2017-12-12 DIAGNOSIS — R2681 Unsteadiness on feet: Secondary | ICD-10-CM | POA: Diagnosis not present

## 2017-12-13 DIAGNOSIS — S93602A Unspecified sprain of left foot, initial encounter: Secondary | ICD-10-CM | POA: Diagnosis not present

## 2017-12-13 DIAGNOSIS — M6281 Muscle weakness (generalized): Secondary | ICD-10-CM | POA: Diagnosis not present

## 2017-12-13 DIAGNOSIS — S93602D Unspecified sprain of left foot, subsequent encounter: Secondary | ICD-10-CM | POA: Diagnosis not present

## 2017-12-13 DIAGNOSIS — R2681 Unsteadiness on feet: Secondary | ICD-10-CM | POA: Diagnosis not present

## 2017-12-21 DIAGNOSIS — M6281 Muscle weakness (generalized): Secondary | ICD-10-CM | POA: Diagnosis not present

## 2017-12-21 DIAGNOSIS — N3946 Mixed incontinence: Secondary | ICD-10-CM | POA: Diagnosis not present

## 2017-12-21 DIAGNOSIS — R2681 Unsteadiness on feet: Secondary | ICD-10-CM | POA: Diagnosis not present

## 2017-12-21 DIAGNOSIS — Z87440 Personal history of urinary (tract) infections: Secondary | ICD-10-CM | POA: Diagnosis not present

## 2017-12-21 DIAGNOSIS — S93602A Unspecified sprain of left foot, initial encounter: Secondary | ICD-10-CM | POA: Diagnosis not present

## 2017-12-21 DIAGNOSIS — M25572 Pain in left ankle and joints of left foot: Secondary | ICD-10-CM | POA: Diagnosis not present

## 2017-12-21 DIAGNOSIS — S93602D Unspecified sprain of left foot, subsequent encounter: Secondary | ICD-10-CM | POA: Diagnosis not present

## 2017-12-26 DIAGNOSIS — S93602D Unspecified sprain of left foot, subsequent encounter: Secondary | ICD-10-CM | POA: Diagnosis not present

## 2017-12-26 DIAGNOSIS — M6281 Muscle weakness (generalized): Secondary | ICD-10-CM | POA: Diagnosis not present

## 2017-12-26 DIAGNOSIS — R2681 Unsteadiness on feet: Secondary | ICD-10-CM | POA: Diagnosis not present

## 2017-12-26 DIAGNOSIS — S93602A Unspecified sprain of left foot, initial encounter: Secondary | ICD-10-CM | POA: Diagnosis not present

## 2017-12-27 DIAGNOSIS — S93602D Unspecified sprain of left foot, subsequent encounter: Secondary | ICD-10-CM | POA: Diagnosis not present

## 2017-12-27 DIAGNOSIS — R2681 Unsteadiness on feet: Secondary | ICD-10-CM | POA: Diagnosis not present

## 2017-12-27 DIAGNOSIS — M6281 Muscle weakness (generalized): Secondary | ICD-10-CM | POA: Diagnosis not present

## 2017-12-27 DIAGNOSIS — S93602A Unspecified sprain of left foot, initial encounter: Secondary | ICD-10-CM | POA: Diagnosis not present

## 2017-12-28 DIAGNOSIS — R2681 Unsteadiness on feet: Secondary | ICD-10-CM | POA: Diagnosis not present

## 2017-12-28 DIAGNOSIS — M6281 Muscle weakness (generalized): Secondary | ICD-10-CM | POA: Diagnosis not present

## 2017-12-28 DIAGNOSIS — S93602A Unspecified sprain of left foot, initial encounter: Secondary | ICD-10-CM | POA: Diagnosis not present

## 2017-12-28 DIAGNOSIS — S93602D Unspecified sprain of left foot, subsequent encounter: Secondary | ICD-10-CM | POA: Diagnosis not present

## 2017-12-28 DIAGNOSIS — K458 Other specified abdominal hernia without obstruction or gangrene: Secondary | ICD-10-CM | POA: Diagnosis not present

## 2018-01-09 ENCOUNTER — Telehealth: Payer: Self-pay

## 2018-01-09 DIAGNOSIS — K4091 Unilateral inguinal hernia, without obstruction or gangrene, recurrent: Secondary | ICD-10-CM | POA: Diagnosis not present

## 2018-01-09 DIAGNOSIS — K219 Gastro-esophageal reflux disease without esophagitis: Secondary | ICD-10-CM | POA: Diagnosis not present

## 2018-01-09 DIAGNOSIS — I10 Essential (primary) hypertension: Secondary | ICD-10-CM | POA: Diagnosis not present

## 2018-01-09 DIAGNOSIS — Z7902 Long term (current) use of antithrombotics/antiplatelets: Secondary | ICD-10-CM | POA: Diagnosis not present

## 2018-01-09 NOTE — Telephone Encounter (Signed)
   [facility] Pre-operative Risk Assessment    Request for surgical clearance:  1. What type of surgery is being performed? GERNIA SURGERY   2. When is this surgery scheduled? TBD   3. What type of clearance is required (medical clearance vs. Pharmacy clearance to hold med vs. Both)? BOTH  4. Are there any medications that need to be held prior to surgery and how long? NOR LISTED BUT TAKES-PLAVIX   5. Practice name and name of physician performing surgery?   [facility] ATTN:TANISHA A BROWN  6. What is your office phone number [phone]    7.   What is your office fax number [phone]  8.   Anesthesia type (None, local, MAC, general) ? GENERA;   Waylan Rocher 01/09/2018, 4:54 PM  _________________________________________________________________   (provider comments below)

## 2018-01-10 NOTE — Telephone Encounter (Signed)
    Primary Cardiologist:Jonathan Gwenlyn Found, MD  Chart reviewed as part of pre-operative protocol coverage. Because of [name]'s past medical history and time since last visit, he/she will require a follow-up visit in order to better assess preoperative cardiovascular risk.  Pre-op covering staff: - Please schedule appointment and call patient to inform them. - Please contact requesting surgeon's office via preferred method (i.e, phone, fax) to inform them of need for appointment prior to surgery.  Rosaria Ferries, PA-C  01/10/2018, 3:11 PM

## 2018-01-11 NOTE — Telephone Encounter (Signed)
Per Pre Op Protocol pt needs appt. Left message to call back and schedule appt with [name] for surgery clearance.

## 2018-01-15 ENCOUNTER — Other Ambulatory Visit: Payer: Self-pay | Admitting: Internal Medicine

## 2018-01-15 DIAGNOSIS — G459 Transient cerebral ischemic attack, unspecified: Secondary | ICD-10-CM

## 2018-01-15 NOTE — Telephone Encounter (Signed)
Pre op appt scheduled for 9/5

## 2018-01-16 NOTE — Progress Notes (Signed)
Cardiology Office Note:    Date:  01/17/2018   ID:  [name], DOB 03-13-1929, MRN [id number]  PCP:  Gayland Curry, DO  Cardiologist:  Quay Burow, MD   Referring MD: Gayland Curry, DO   Chief Complaint  Patient presents with  . Medical Clearance    History of Present Illness:    [name] AKIAH BAUCH is a 84 y.o. female with a hx of GERD and palpitations on a low-dose beta-blocker.  She was evaluated in 2014 for chest pain, ruled out for MI.  She had a Myoview stress test that was normal.  Echocardiogram in 2014 with LVEF of 60 to 65%. She follows with neurology for a small cerebral aneurysm treated with conservative medical therapy. She was last seen by [name] in clinic on 04/17/2017 and was doing well at that time.  We follow her hypertension and hyperlipidemia.  She returns today for preoperative cardiac clearance for hernia surgery.  EKG today with normal sinus rhythm.  She denies any chest pain, shortness of breath, palpitations, lower extremity swelling, syncope.  Overall she is doing quite well, no changes from her prior visit with [name] in December.   Past Medical History:  Diagnosis Date  . Cervicalgia   . Chest pain at rest 01/07/2012  . Displacement of cervical intervertebral disc without myelopathy   . Diverticulosis of colon (without mention of hemorrhage)   . Female stress incontinence   . GERD (gastroesophageal reflux disease)   . Inguinal hernia without mention of obstruction or gangrene, unilateral or unspecified, (not specified as recurrent)   . Internal hemorrhoids without mention of complication   . Lumbago   . Macular degeneration (senile) of retina, unspecified   . Osteoarthrosis, unspecified whether generalized or localized, unspecified site   . Other and unspecified hyperlipidemia   . Pain in joint, hand   . Palpitations   . Reflux esophagitis   . Scoliosis (and kyphoscoliosis), idiopathic   . Senile osteoporosis   . Skin disorder    . Thyroid disease   . Unruptured popliteal cyst 06/24/2014   Right knee   . Unspecified essential hypertension   . Unspecified glaucoma(365.9)   . Unspecified hypothyroidism   . Unspecified vitamin D deficiency     Past Surgical History:  Procedure Laterality Date  . ABDOMINAL HYSTERECTOMY  1975   Dr Mallie Mussel  . APPENDECTOMY  1988  . cardiolite myocardial perfusion study    . DOPPLER ECHOCARDIOGRAPHY    . EYE SURGERY Bilateral 2009   cataract removed right eye, Dr Charise Killian  . FOOT SURGERY  august 2013   Doran Durand, MD  . INGUINAL HERNIA REPAIR Bilateral    w/mesh  . INGUINAL HERNIA REPAIR Left 08/03/2017   Procedure: LAPAROSCOPIC LEFT INGUINAL HERNIA REPAIR WITH MESH;  Surgeon: Greer Pickerel, MD;  Location: [facility];  Service: General;  Laterality: Left;  . INGUINAL HERNIA REPAIR Right 08/03/2017   Procedure: HERNIA REPAIR RIGHT INGUINAL ADULT WITH MESH;  Surgeon: Greer Pickerel, MD;  Location: [facility];  Service: General;  Laterality: Right;  . INSERTION OF MESH Bilateral 08/03/2017   Procedure: INSERTION OF MESH;  Surgeon: Greer Pickerel, MD;  Location: [facility];  Service: General;  Laterality: Bilateral;  . NM MYOVIEW LTD     negative  . TONSILLECTOMY  1937  . TOOTH EXTRACTION  09/16/13   Dr Carlos American    Current Medications: Current Meds  Medication Sig  . acetaminophen (TYLENOL) 500 MG tablet Take 500 mg  by mouth 2 (two) times daily as needed for mild pain or headache.   . bacitracin-polymyxin b (POLYSPORIN) ointment Apply 1 application topically daily as needed (wound care).  . bimatoprost (LUMIGAN) 0.01 % SOLN Place 1 drop into both eyes at bedtime.  . calcium elemental as carbonate (TUMS ULTRA 1000) 400 MG chewable tablet Chew 500 mg by mouth daily with lunch.  . Cholecalciferol (VITAMIN D3) 2000 UNITS TABS Take 2,000 Units by mouth 2 (two) times daily.   . clopidogrel (PLAVIX) 75 MG tablet TAKE 1 TABLET ONCE A DAY TO PREVENT STROKE.  Marland Kitchen CRANBERRY EXTRACT PO Take 1 tablet by mouth daily.  .  hydrocortisone cream 1 % Apply 1 application topically as needed for itching.  . metoprolol succinate (TOPROL-XL) 25 MG 24 hr tablet Take 1 tablet (25 mg total) by mouth daily.  . Multiple Vitamins-Minerals (PRESERVISION AREDS 2 PO) Take 1 tablet by mouth 2 (two) times daily.   Vladimir Faster Glycol-Propyl Glycol (SYSTANE ULTRA OP) Place 1 vial into both eyes 2 (two) times daily. May use another dose during the day as needed for dry eye  . ranitidine (ZANTAC) 150 MG tablet Take 150 mg by mouth daily as needed for heartburn.      Allergies:   Trimethoprim; Erythromycin; Macrodantin [nitrofurantoin macrocrystal]; Penicillins; Sulfa antibiotics; and Latex   Social History   Socioeconomic History  . Marital status: Widowed    Spouse name: Not on file  . Number of children: 2  . Years of education: college  . Highest education level: Not on file  Occupational History    Comment: retired  Scientific laboratory technician  . Financial resource strain: Not hard at all  . Food insecurity:    Worry: Never true    Inability: Never true  . Transportation needs:    Medical: No    Non-medical: No  Tobacco Use  . Smoking status: Never Smoker  . Smokeless tobacco: Never Used  Substance and Sexual Activity  . Alcohol use: No  . Drug use: No  . Sexual activity: Never  Lifestyle  . Physical activity:    Days per week: 3 days    Minutes per session: 30 min  . Stress: Only a little  Relationships  . Social connections:    Talks on phone: More than three times a week    Gets together: More than three times a week    Attends religious service: More than 4 times per year    Active member of club or organization: Yes    Attends meetings of clubs or organizations: More than 4 times per year    Relationship status: Widowed  Other Topics Concern  . Not on file  Social History Narrative   Patient lives at home with her daughter [name]) , moved to [facility] 02/02/16 [place]    Widowed.   Retired.   EducationInsurance underwriter.   Right handed.   Caffeine- None some times tea very rare.   Never smoked   Alcohol none     Family History: The patient's family history includes CAD in her mother; Cancer in her father; Parkinson's disease in her father and mother; Stroke in her maternal grandmother.  ROS:   Please see the history of present illness.     All other systems reviewed and are negative.  EKGs/Labs/Other Studies Reviewed:    The following studies were reviewed today:    EKG:  EKG is ordered today.  The ekg ordered today demonstrates normal sinus  rhythm, heart rate 69 bpm  Recent Labs: 07/26/2017: ALT 19; Hemoglobin 12.4; Platelets 203 08/04/2017: BUN 12; Creatinine, Ser 0.70; Potassium 4.3; Sodium 134  Recent Lipid Panel    Component Value Date/Time   CHOL 190 06/12/2017 0908   CHOL 185 06/18/2015 0922   TRIG 96 06/12/2017 0908   HDL 86 06/12/2017 0908   HDL 85 06/18/2015 0922   CHOLHDL 2.2 06/12/2017 0908   VLDL 17 06/23/2016 0001   LDLCALC 85 06/12/2017 0908    Physical Exam:    VS:  BP (!) 152/70   Pulse 69   Ht 5' (1.524 m)   Wt 126 lb 9.6 oz (57.4 kg)   BMI 24.72 kg/m     Wt Readings from Last 3 Encounters:  01/17/18 126 lb 9.6 oz (57.4 kg)  10/29/17 128 lb 3.2 oz (58.2 kg)  10/15/17 126 lb (57.2 kg)     GEN: Well nourished, well developed in no acute distress HEENT: Normal NECK: No JVD; No carotid bruits CARDIAC: RRR, no murmurs, rubs, gallops RESPIRATORY:  Clear to auscultation without rales, wheezing or rhonchi  ABDOMEN: Soft, non-tender, non-distended MUSCULOSKELETAL:  No edema; No deformity  SKIN: Warm and dry NEUROLOGIC:  Alert and oriented x 3 PSYCHIATRIC:  Normal affect   ASSESSMENT:    1. Essential hypertension   2. Brain aneurysm   3. Chronic venous insufficiency   4. Pure hypercholesterolemia   5. Preoperative clearance    PLAN:    In order of problems listed above:  Essential hypertension - Plan: EKG 12-Lead Pressure is borderline today  I will make no medication changes.  Continue Toprol.   Brain aneurysm Dr. Hollace Kinnier manages her plavix. OK to hold plavix for 4 days from a cardiology standpoint.   Chronic venous insufficiency Pedal pulses palpated, mild lower extremity swelling that is her baseline.  Pure hypercholesterolemia 06/12/2017: Cholesterol 190; HDL 86; LDL Cholesterol (Calc) 85; Triglycerides 96  Preoperative clearance Ms. Birkey is not diabetic and has normal creatinine clearance or symptoms of heart failure.  Overall she is doing very well from a cardiac standpoint.  She completes her ADLs independently.  She is cleared for hernia surgery from a cardiac perspective without further testing.  Okay to hold Plavix 4 days prior to surgery; however, this is managed by her PCP.  I have asked her to get clearance for holding her Plavix from her PCP.  I will fax this clearance to the requesting physician.  [facility] [phone] Dr. Greer Pickerel   Follow-up with [name] in 6 months.   Medication Adjustments/Labs and Tests Ordered: Current medicines are reviewed at length with the patient today.  Concerns regarding medicines are outlined above.  Orders Placed This Encounter  Procedures  . EKG 12-Lead   No orders of the defined types were placed in this encounter.   Signed, Ledora Bottcher, [place]  01/17/2018 12:33 PM    [facility]

## 2018-01-17 ENCOUNTER — Encounter: Payer: Self-pay | Admitting: Physician Assistant

## 2018-01-17 ENCOUNTER — Ambulatory Visit (INDEPENDENT_AMBULATORY_CARE_PROVIDER_SITE_OTHER): Payer: Medicare Other | Admitting: Physician Assistant

## 2018-01-17 VITALS — BP 152/70 | HR 69 | Ht 60.0 in | Wt 126.6 lb

## 2018-01-17 DIAGNOSIS — I671 Cerebral aneurysm, nonruptured: Secondary | ICD-10-CM

## 2018-01-17 DIAGNOSIS — Z01818 Encounter for other preprocedural examination: Secondary | ICD-10-CM | POA: Diagnosis not present

## 2018-01-17 DIAGNOSIS — I872 Venous insufficiency (chronic) (peripheral): Secondary | ICD-10-CM

## 2018-01-17 DIAGNOSIS — I10 Essential (primary) hypertension: Secondary | ICD-10-CM

## 2018-01-17 DIAGNOSIS — E7800 Pure hypercholesterolemia, unspecified: Secondary | ICD-10-CM | POA: Diagnosis not present

## 2018-01-17 NOTE — Patient Instructions (Signed)
Medication Instructions:  No Changes.  If you need a refill on your cardiac medications before your next appointment, please call your pharmacy.  Labwork: None Ordered.  Testing/Procedures: None Ordered.   Follow-Up: Your physician wants you to follow-up in: 3-4 Months with Dr.Berry.    Thank you for choosing [facility] at [facility]!!

## 2018-01-23 ENCOUNTER — Telehealth: Payer: Self-pay | Admitting: *Deleted

## 2018-01-23 NOTE — Telephone Encounter (Signed)
Patient called and wanted to know if Dr. Greer Pickerel had contacted you about her upcoming hernia surgery. Patient stated that she needs directions on her blood thinner. Stated she needed to know when to stop it and when to start it back. Please Advise.

## 2018-01-24 NOTE — Telephone Encounter (Signed)
[name] is on her plavix (clopidogrel) due to her prior transient ischemic attack.  I did receive a note from Dr. Redmond Pulling, but did not receive a preop clearance form to complete.  [name] should hold her plavix for 5 days before her surgery.  She should resume her plavix as soon as Dr. Redmond Pulling feels safe with her resuming it postop (typically the next morning). Please let her know.  I've cc'd Dr. Redmond Pulling on this message.

## 2018-01-24 NOTE — Telephone Encounter (Signed)
Patient notified and agreed.  Confirmed next appointment with Dr. Mariea Clonts with patient.

## 2018-01-29 ENCOUNTER — Ambulatory Visit: Payer: Self-pay | Admitting: General Surgery

## 2018-02-15 ENCOUNTER — Telehealth: Payer: Self-pay | Admitting: Cardiovascular Disease

## 2018-02-15 NOTE — Telephone Encounter (Signed)
New message:      Pt is calling and states we were supposed to send her echo results over to a Dr. Greer Pickerel and she states they have not received that as of yet. Please advise.

## 2018-02-15 NOTE — Telephone Encounter (Signed)
Returned call to patient who states Dr. Dois Davenport office has not received the clearance for her surgery.     OV 9/5 with Fabian Sharp PA-clearance given and note faxed.   Advised patient would refax to Dr. Redmond Pulling.  Patient aware  OV note faxed to [phone] attn: Dia Crawford

## 2018-02-21 DIAGNOSIS — H04123 Dry eye syndrome of bilateral lacrimal glands: Secondary | ICD-10-CM | POA: Diagnosis not present

## 2018-02-21 DIAGNOSIS — Z961 Presence of intraocular lens: Secondary | ICD-10-CM | POA: Diagnosis not present

## 2018-02-21 DIAGNOSIS — H353132 Nonexudative age-related macular degeneration, bilateral, intermediate dry stage: Secondary | ICD-10-CM | POA: Diagnosis not present

## 2018-02-21 DIAGNOSIS — H401132 Primary open-angle glaucoma, bilateral, moderate stage: Secondary | ICD-10-CM | POA: Diagnosis not present

## 2018-02-25 ENCOUNTER — Ambulatory Visit (INDEPENDENT_AMBULATORY_CARE_PROVIDER_SITE_OTHER): Payer: Medicare Other | Admitting: Internal Medicine

## 2018-02-25 ENCOUNTER — Encounter: Payer: Self-pay | Admitting: Internal Medicine

## 2018-02-25 VITALS — BP 120/80 | HR 62 | Temp 97.6°F | Ht 60.0 in | Wt 128.0 lb

## 2018-02-25 DIAGNOSIS — I10 Essential (primary) hypertension: Secondary | ICD-10-CM | POA: Diagnosis not present

## 2018-02-25 DIAGNOSIS — Z23 Encounter for immunization: Secondary | ICD-10-CM | POA: Diagnosis not present

## 2018-02-25 DIAGNOSIS — K4090 Unilateral inguinal hernia, without obstruction or gangrene, not specified as recurrent: Secondary | ICD-10-CM

## 2018-02-25 DIAGNOSIS — W182XXA Fall in (into) shower or empty bathtub, initial encounter: Secondary | ICD-10-CM

## 2018-02-25 DIAGNOSIS — N3281 Overactive bladder: Secondary | ICD-10-CM

## 2018-02-25 DIAGNOSIS — I872 Venous insufficiency (chronic) (peripheral): Secondary | ICD-10-CM | POA: Diagnosis not present

## 2018-02-25 DIAGNOSIS — M8580 Other specified disorders of bone density and structure, unspecified site: Secondary | ICD-10-CM

## 2018-02-25 DIAGNOSIS — M24275 Disorder of ligament, left foot: Secondary | ICD-10-CM

## 2018-02-25 MED ORDER — CA PHOSPHATE-CHOLECALCIFEROL 250-400 MG-UNIT PO CHEW: 4.0000 [IU] | CHEWABLE_TABLET | Freq: Every day | ORAL | 0 refills | Status: DC

## 2018-02-25 NOTE — Progress Notes (Signed)
Location:  [facility] clinic Provider:  Cameryn Chrisley L. Mariea Clonts, D.O., C.M.D.  Code Status: DNR Goals of Care:  Advanced Directives 10/29/2017  Does Patient Have a Medical Advance Directive? Yes  Type of Paramedic of [place];Living will;Out of facility DNR (pink MOST or yellow form)  Does patient want to make changes to medical advance directive? -  Copy of [place] in Chart? Yes  Pre-existing out of facility DNR order (yellow form or pink MOST form) Yellow form placed in chart (order not valid for inpatient use)   Chief Complaint  Patient presents with  . Medical Management of Chronic Issues    24mth follow-up    HPI: Patient is a 84 y.o. female seen today for medical management of chronic diseases.    Slid down getting out of the shower today.  She forgot about her pull cord.  Her knees are a little red from getting down on her knees--kneeled her way to the phone.    Got a pain in her left arch when she left here last time.  It continued to hurt afterwards.  She got in with Dr. Doran Durand.  He xrayed her foot and was puzzled.  She'd had quite a bit of cipro over the spring.  He thought it had affected her spring ligament.  She was in a boot for the month of June.  It did the trick and that's better. Saw his PA afterwards and again in August.    June 12th, she picked up a big bag of toilet paper rolls from a weird position.  She lifted it a little, then felt a ping just below her neck.  She saw Levy Pupa, PA for Dr. Nelva Bush.  She put her back on her MVI and a 500mg  vitamin C and told her to drink pure cranberry juice watered down a little.  A friend also recommended azo cranberry and she's doing that, too.    Went to urology. Saw Dr. Rosana Hoes, [facility]. She'd seen him years ago.  He did a cystoscopy and said her bladder was in good shape and had good support.  She got bladder retraining handouts--needs to set a timer.  Then was to see a therapist for  more about this, but hasn't happened.    She saw her retina specialist--eyes doing ok.  She needed some therapy after being in the boot.  Got some PT after 7/12 evaluation and worked with PT until 8/16.    Had a bump appear in her right groin with another hernia.  Surgeon said it was not a hernia he dealt with in the surgery.  Now she has to have another surgery for this.  [name] and I have to give approval.  She had EKG with preop clearance with Fabian Sharp and "got an A+".  They said to hold the plavix 4 days (rather than 5 as I had written) before which I agree is better as long as Dr. Redmond Pulling is good with this.     She'd been taking tums for her calcium.  I had suggested caltrate with D gummies and additional D she already takes.    Past Medical History:  Diagnosis Date  . Cervicalgia   . Chest pain at rest 01/07/2012  . Displacement of cervical intervertebral disc without myelopathy   . Diverticulosis of colon (without mention of hemorrhage)   . Female stress incontinence   . GERD (gastroesophageal reflux disease)   . Inguinal hernia without  mention of obstruction or gangrene, unilateral or unspecified, (not specified as recurrent)   . Internal hemorrhoids without mention of complication   . Lumbago   . Macular degeneration (senile) of retina, unspecified   . Osteoarthrosis, unspecified whether generalized or localized, unspecified site   . Other and unspecified hyperlipidemia   . Pain in joint, hand   . Palpitations   . Reflux esophagitis   . Scoliosis (and kyphoscoliosis), idiopathic   . Senile osteoporosis   . Skin disorder   . Thyroid disease   . Unruptured popliteal cyst 06/24/2014   Right knee   . Unspecified essential hypertension   . Unspecified glaucoma(365.9)   . Unspecified hypothyroidism   . Unspecified vitamin D deficiency     Past Surgical History:  Procedure Laterality Date  . ABDOMINAL HYSTERECTOMY  1975   Dr Mallie Mussel  . APPENDECTOMY  1988  . cardiolite  myocardial perfusion study    . DOPPLER ECHOCARDIOGRAPHY    . EYE SURGERY Bilateral 2009   cataract removed right eye, Dr Charise Killian  . FOOT SURGERY  august 2013   Doran Durand, MD  . INGUINAL HERNIA REPAIR Bilateral    w/mesh  . INGUINAL HERNIA REPAIR Left 08/03/2017   Procedure: LAPAROSCOPIC LEFT INGUINAL HERNIA REPAIR WITH MESH;  Surgeon: Greer Pickerel, MD;  Location: [place];  Service: General;  Laterality: Left;  . INGUINAL HERNIA REPAIR Right 08/03/2017   Procedure: HERNIA REPAIR RIGHT INGUINAL ADULT WITH MESH;  Surgeon: Greer Pickerel, MD;  Location: [place];  Service: General;  Laterality: Right;  . INSERTION OF MESH Bilateral 08/03/2017   Procedure: INSERTION OF MESH;  Surgeon: Greer Pickerel, MD;  Location: [place];  Service: General;  Laterality: Bilateral;  . NM MYOVIEW LTD     negative  . TONSILLECTOMY  1937  . TOOTH EXTRACTION  09/16/13   Dr Carlos American    Allergies  Allergen Reactions  . Trimethoprim Other (See Comments)    Headaches/ ear pressure   . Erythromycin     UNSPECIFIED REACTION   . Macrodantin [Nitrofurantoin Macrocrystal]     UNSPECIFIED REACTION   . Penicillins     UNSPECIFIED REACTION  Has patient had a PCN reaction causing immediate rash, facial/tongue/throat swelling, SOB or lightheadedness with hypotension: Unknown Has patient had a PCN reaction causing severe rash involving mucus membranes or skin necrosis: Unknown Has patient had a PCN reaction that required hospitalization: No Has patient had a PCN reaction occurring within the last 10 years: No If all of the above answers are "NO", then may proceed with Cephalosporin use.  . Sulfa Antibiotics     UNSPECIFIED REACTION   . Latex Itching    Outpatient Encounter Medications as of 02/25/2018  Medication Sig  . acetaminophen (TYLENOL) 500 MG tablet Take 500 mg by mouth 2 (two) times daily as needed for mild pain or headache.   . bacitracin-polymyxin b (POLYSPORIN) ointment Apply 1 application topically daily as needed  (wound care).  . bimatoprost (LUMIGAN) 0.01 % SOLN Place 1 drop into both eyes at bedtime.  . calcium elemental as carbonate (TUMS ULTRA 1000) 400 MG chewable tablet Chew 500 mg by mouth daily with lunch.  . Cholecalciferol (VITAMIN D3) 2000 UNITS TABS Take 2,000 Units by mouth 2 (two) times daily.   . clopidogrel (PLAVIX) 75 MG tablet TAKE 1 TABLET ONCE A DAY TO PREVENT STROKE.  Marland Kitchen CRANBERRY EXTRACT PO Take 1 tablet by mouth daily.  . famotidine (PEPCID AC) 10 MG chewable tablet Chew 10 mg by  mouth at bedtime as needed for heartburn.  . hydrocortisone cream 1 % Apply 1 application topically as needed for itching.  . metoprolol succinate (TOPROL-XL) 25 MG 24 hr tablet Take 1 tablet (25 mg total) by mouth daily.  . Multiple Vitamins-Minerals (PRESERVISION AREDS 2 PO) Take 1 tablet by mouth 2 (two) times daily.   Vladimir Faster Glycol-Propyl Glycol (SYSTANE ULTRA OP) Place 1 vial into both eyes 2 (two) times daily. May use another dose during the day as needed for dry eye  . [DISCONTINUED] ranitidine (ZANTAC) 150 MG tablet Take 150 mg by mouth daily as needed for heartburn.    No facility-administered encounter medications on file as of 02/25/2018.     Review of Systems:  Review of Systems  Constitutional: Negative for chills, fever and malaise/fatigue.  HENT: Negative for congestion.   Eyes:       Glasses  Respiratory: Negative for shortness of breath.   Cardiovascular: Positive for leg swelling. Negative for chest pain and palpitations.       Venous insufficiency  Gastrointestinal: Negative for abdominal pain, blood in stool, constipation, diarrhea and melena.       Hernia  Genitourinary: Negative for dysuria.  Musculoskeletal: Negative for falls.       Walks with cane, fell in shower today  Skin: Negative for itching and rash.  Neurological: Negative for dizziness and loss of consciousness.  Psychiatric/Behavioral: Negative for depression and memory loss. The patient is not  nervous/anxious and does not have insomnia.     Health Maintenance  Topic Date Due  . INFLUENZA VACCINE  12/13/2017  . MAMMOGRAM  04/30/2018  . TETANUS/TDAP  03/20/2020  . DEXA SCAN  Completed  . PNA vac Low Risk Adult  Completed    Physical Exam: Vitals:   02/25/18 1525  BP: 120/80  Pulse: 62  Temp: 97.6 F (36.4 C)  TempSrc: Oral  SpO2: 98%  Weight: 128 lb (58.1 kg)  Height: 5' (1.524 m)   Body mass index is 25 kg/m. Physical Exam  Constitutional: She is oriented to person, place, and time. No distress.  HENT:  Head: Normocephalic and atraumatic.  Cardiovascular: Normal rate, regular rhythm, normal heart sounds and intact distal pulses.  Pulmonary/Chest: Effort normal and breath sounds normal. No respiratory distress.  Abdominal: Soft. Bowel sounds are normal. She exhibits no distension. There is no tenderness.  Musculoskeletal: Normal range of motion. She exhibits no tenderness.  Ambulates with cane  Neurological: She is alert and oriented to person, place, and time. No sensory deficit.  Skin: Capillary refill takes less than 2 seconds.  Erythema over knees where she dragged herself on towels to the phone (forgot to pull emergency cord in bathroom)  Psychiatric: She has a normal mood and affect. Her behavior is normal. Judgment and thought content normal.    Labs reviewed: Basic Metabolic Panel: Recent Labs    06/12/17 0908 07/26/17 1428 08/03/17 1342 08/04/17 0629  NA 139 138  --  134*  K 4.2 4.2  --  4.3  CL 102 103  --  99*  CO2 31 25  --  24  GLUCOSE 76 88  --  109*  BUN 21 17  --  12  CREATININE 0.68 0.67 0.73 0.70  CALCIUM 9.0 9.0  --  8.5*   Liver Function Tests: Recent Labs    06/12/17 0908 07/26/17 1428  AST 21 24  ALT 16 19  ALKPHOS  --  52  BILITOT 0.5 0.5  PROT 6.5  6.2*  ALBUMIN  --  3.7   No results for input(s): LIPASE, AMYLASE in the last 8760 hours. No results for input(s): AMMONIA in the last 8760 hours. CBC: Recent Labs     06/08/17 2013 06/12/17 0908 07/26/17 1428  WBC 6.0 5.4 7.3  NEUTROABS 3.1 2,549 4.6  HGB 12.9 12.8 12.4  HCT 38.7 37.6 37.7  MCV 93.5 90.0 94.3  PLT 206 231 203   Lipid Panel: Recent Labs    06/12/17 0908  CHOL 190  HDL 86  LDLCALC 85  TRIG 96  CHOLHDL 2.2   Lab Results  Component Value Date   HGBA1C 5.3 05/02/2013    Assessment/Plan 1. Essential hypertension -bp well-controlled, cont current regimen  2. Disorder of ligament of left foot -resolved after wearing boot for a month -then had PT for strengthening afterwards -initial injury felt to be due to too much cipro for UTIs  3. Senile osteopenia - change vitamin D to 2000 units once a day - add Ca Phosphate-Cholecalciferol (CALTRATE GUMMY BITES) 250-400 MG-UNIT CHEW; Chew 4 Units by mouth daily.  Dispense: 120 tablet; Refill: 0 in place of tums  4. Overactive bladder -work on scheduled toileting program from urology   5. Need for influenza vaccination - Flu vaccine HIGH DOSE PF (Fluzone High dose)  6. Chronic venous insufficiency -elevate feet at rest, if this worsens, would use compression hose  7.  Right groin hernia -for surgery [place] per generally surgery--is going to need to hold plavix at least 4 days prior to surgery  8.  Fall in shower -happened just before appt today, came later as a result -has only some minor abrasions of knees -may use tylenol and ice for sore areas that may occur later today/tonight/tomorrow  Already has preop labs ordered as part of surgery protocol so will not duplicate these now.  Labs/tests ordered:  No new Next appt:  06/27/2018  Miner Koral L. Da Authement, D.O. [facility] [address], [place] [address] Cell Phone (Mon-Fri 8am-5pm):  [phone] On Call:  [phone] & follow prompts after 5pm & weekends Office Phone:  [phone] Office Fax:  [phone]

## 2018-03-06 ENCOUNTER — Telehealth: Payer: Self-pay

## 2018-03-06 NOTE — Telephone Encounter (Signed)
Discussed response with patient, patient verbalized understanding of Dr.Reed's response although she continued to repeat Im surprised that she said it will be ok to take that much Vit D3.

## 2018-03-06 NOTE — Telephone Encounter (Signed)
1.) Patient called to question calcium/vit d3 recommendation. Patient has looked everywhere for the specific recommendation that Dr.Reed recommend at last ov (refer to med list) with no success.   Patient is currently on Vit D3 2000 units daily and her daughter ordered Calcium/VitD3/Minerals supplement with 600 mg of calcium, 800 iu of vit D3 and other minerals. This would put patient at a total of 2800 iu of Vit D3 if she takes with her current dose of D3 and it would put her over the calcium recommendation given by Dr.Reed. Patient would like to make sure this is ok and if not she will have to give away supplement in route that her daughter ordered and continue to look for the correct dosage  2.) Patient would also like for Dr.Reed to confirm that Pepcid is the best alternative to Zantac (d/c'ed due to cancer warning in the news)  Patient indicated that it is ok to leave a detailed message on her home or cell phone if no answer  Please advise

## 2018-03-06 NOTE — Telephone Encounter (Signed)
1.  The new calcium with D supplement is fine for [facility] to take with her vitamin D3 2000 units daily. 2.  Pepcid is an acceptable alternative to zantac.

## 2018-03-12 ENCOUNTER — Telehealth: Payer: Self-pay | Admitting: *Deleted

## 2018-03-12 NOTE — Telephone Encounter (Signed)
Patient would like to know how much Caltrate she should be taking. The suggested packing stated One chewable tablet up to two times daily.   Also would like to know how much Vitamin D3 5mcg she should be taking. The suggested packing stated One soft gel with a meal twice daily.   Please Advise.

## 2018-03-12 NOTE — Telephone Encounter (Signed)
Patient notified and agreed.   Patient stated that you knew about her falling in the shower. She was down in the therapy room at [facility]) and asked the therapist a question about working on strengthening her legs. Wants to know if you would approve therapy for her legs to strengthen. Please Advise.

## 2018-03-12 NOTE — Telephone Encounter (Signed)
Agree with PT for leg strengthening.

## 2018-03-12 NOTE — Telephone Encounter (Signed)
caltrate tablets should be a total of 1200mg  per day of calcium.  She can distribute the chews however she'd like over the course of the day. Vitamin D3 should be 2000 units total daily.  I'm not sure about this 20mcg?  I haven't seen it measured that way.

## 2018-03-14 NOTE — Telephone Encounter (Signed)
Patient Notified

## 2018-03-15 NOTE — Telephone Encounter (Signed)
Patient called to ask if Dr. Mariea Clonts was going to order the PT for leg strengthening. Pt would like to have PT at [facility] with [facility].

## 2018-03-18 NOTE — Telephone Encounter (Signed)
An order to evaluate patient for physical therapy for leg strengthening was faxed to [facility] at [facility]   Phone: [phone] Fax: [phone]

## 2018-03-18 NOTE — Telephone Encounter (Signed)
I thought this was an add-on order.  I guess it needs to be written on a script and sent to [facility].

## 2018-03-18 NOTE — Telephone Encounter (Signed)
Correction: fax was returned as undeliverable. I have called [facility] to verify the fax number but I had to leave a message asking for a return call. Forms were placed in my office mailbox until a return call is received from [place].

## 2018-03-19 ENCOUNTER — Telehealth: Payer: Self-pay

## 2018-03-19 DIAGNOSIS — S40011A Contusion of right shoulder, initial encounter: Secondary | ICD-10-CM | POA: Diagnosis not present

## 2018-03-19 DIAGNOSIS — S7001XA Contusion of right hip, initial encounter: Secondary | ICD-10-CM | POA: Diagnosis not present

## 2018-03-19 NOTE — Telephone Encounter (Signed)
Orders for Physical Therapy have been delivered to [facility]. No one was in office at the time, so left paper work with [name] (speech therapist) who is just next door to physical therapy office. He stated that he will make sure [name] in [employer] physical therapy gets them as soon as he returns to the office.

## 2018-03-19 NOTE — Telephone Encounter (Signed)
Order was faxed to [place], [place] at [facility]. She will take order to PT department at [facility].

## 2018-03-20 ENCOUNTER — Encounter: Payer: Self-pay | Admitting: Family Medicine

## 2018-03-20 ENCOUNTER — Non-Acute Institutional Stay: Payer: Medicare Other | Admitting: Family Medicine

## 2018-03-20 DIAGNOSIS — S7001XD Contusion of right hip, subsequent encounter: Secondary | ICD-10-CM | POA: Diagnosis not present

## 2018-03-20 DIAGNOSIS — S7000XA Contusion of unspecified hip, initial encounter: Secondary | ICD-10-CM | POA: Insufficient documentation

## 2018-03-20 NOTE — Progress Notes (Signed)
Location: [facility] of Service:  Clinic [phone])  Provider: Dr. Alain Honey  Code Status: DNR Goals of Care:  Advanced Directives 03/20/2018  Does Patient Have a Medical Advance Directive? Yes  Type of Paramedic of [place];Living will;Out of facility DNR (pink MOST or yellow form)  Does patient want to make changes to medical advance directive? No - Patient declined  Copy of [place] in Chart? Yes - validated most recent copy scanned in chart (See row information)  Pre-existing out of facility DNR order (yellow form or pink MOST form) Yellow form placed in chart (order not valid for inpatient use)     Chief Complaint  Patient presents with  . Acute Visit    Fall and spot on right arm    HPI: Patient is a 84 y.o. female seen today for an acute visit for Patient suffered a fall last night.  She was transported to pediatric urgent care on [address] she had x-rays done of her right shoulder pelvis and hips.  No broken bones.  She was discharged with instructions for activity as tolerated with use of ice then heat multiple contusions.  Today she has some soreness but has not even taken Tylenol as she did not feel the need.  She has been using ice packs over her hip. Past Medical History:  Diagnosis Date  . Cervicalgia   . Chest pain at rest 01/07/2012  . Displacement of cervical intervertebral disc without myelopathy   . Diverticulosis of colon (without mention of hemorrhage)   . Female stress incontinence   . GERD (gastroesophageal reflux disease)   . Inguinal hernia without mention of obstruction or gangrene, unilateral or unspecified, (not specified as recurrent)   . Internal hemorrhoids without mention of complication   . Lumbago   . Macular degeneration (senile) of retina, unspecified   . Osteoarthrosis, unspecified whether generalized or localized, unspecified site   . Other and unspecified hyperlipidemia   .  Pain in joint, hand   . Palpitations   . Reflux esophagitis   . Scoliosis (and kyphoscoliosis), idiopathic   . Senile osteoporosis   . Skin disorder   . Thyroid disease   . Unruptured popliteal cyst 06/24/2014   Right knee   . Unspecified essential hypertension   . Unspecified glaucoma(365.9)   . Unspecified hypothyroidism   . Unspecified vitamin D deficiency     Past Surgical History:  Procedure Laterality Date  . ABDOMINAL HYSTERECTOMY  1975   Dr Mallie Mussel  . APPENDECTOMY  1988  . cardiolite myocardial perfusion study    . DOPPLER ECHOCARDIOGRAPHY    . EYE SURGERY Bilateral 2009   cataract removed right eye, Dr Charise Killian  . FOOT SURGERY  august 2013   Doran Durand, MD  . INGUINAL HERNIA REPAIR Bilateral    w/mesh  . INGUINAL HERNIA REPAIR Left 08/03/2017   Procedure: LAPAROSCOPIC LEFT INGUINAL HERNIA REPAIR WITH MESH;  Surgeon: Greer Pickerel, MD;  Location: [facility];  Service: General;  Laterality: Left;  . INGUINAL HERNIA REPAIR Right 08/03/2017   Procedure: HERNIA REPAIR RIGHT INGUINAL ADULT WITH MESH;  Surgeon: Greer Pickerel, MD;  Location: [facility];  Service: General;  Laterality: Right;  . INSERTION OF MESH Bilateral 08/03/2017   Procedure: INSERTION OF MESH;  Surgeon: Greer Pickerel, MD;  Location: [facility];  Service: General;  Laterality: Bilateral;  . NM MYOVIEW LTD     negative  . TONSILLECTOMY  1937  . TOOTH  EXTRACTION  09/16/13   Dr Carlos American    Allergies  Allergen Reactions  . Trimethoprim Other (See Comments)    Headaches/ ear pressure   . Erythromycin     UNSPECIFIED REACTION   . Macrodantin [Nitrofurantoin Macrocrystal]     UNSPECIFIED REACTION   . Penicillins     UNSPECIFIED REACTION  Has patient had a PCN reaction causing immediate rash, facial/tongue/throat swelling, SOB or lightheadedness with hypotension: Unknown Has patient had a PCN reaction causing severe rash involving mucus membranes or skin necrosis: Unknown Has patient had a PCN reaction that required hospitalization:  No Has patient had a PCN reaction occurring within the last 10 years: No If all of the above answers are "NO", then may proceed with Cephalosporin use.  . Sulfa Antibiotics     UNSPECIFIED REACTION   . Latex Itching    Outpatient Encounter Medications as of 03/20/2018  Medication Sig  . acetaminophen (TYLENOL) 500 MG tablet Take 500 mg by mouth 2 (two) times daily as needed for mild pain or headache.   . bacitracin-polymyxin b (POLYSPORIN) ointment Apply 1 application topically daily as needed (wound care).  . bimatoprost (LUMIGAN) 0.01 % SOLN Place 1 drop into both eyes at bedtime.  . Calcium Carbonate-Vitamin D 600-400 MG-UNIT chew tablet Chew 2 tablets by mouth daily.  . Cholecalciferol (VITAMIN D3) 2000 UNITS TABS Take 2,000 Units by mouth daily.  . clopidogrel (PLAVIX) 75 MG tablet TAKE 1 TABLET ONCE A DAY TO PREVENT STROKE.  Marland Kitchen CRANBERRY EXTRACT PO Take 1 tablet by mouth daily.  . famotidine (PEPCID AC) 10 MG chewable tablet Chew 10 mg by mouth at bedtime as needed for heartburn.  . hydrocortisone cream 1 % Apply 1 application topically as needed for itching.  . metoprolol succinate (TOPROL-XL) 25 MG 24 hr tablet Take 1 tablet (25 mg total) by mouth daily.  . Multiple Vitamins-Minerals (PRESERVISION AREDS 2 PO) Take 1 tablet by mouth 2 (two) times daily.   Vladimir Faster Glycol-Propyl Glycol (SYSTANE ULTRA OP) Place 1 vial into both eyes 2 (two) times daily. May use another dose during the day as needed for dry eye  . psyllium (METAMUCIL) 58.6 % packet Take 1 packet by mouth daily. Mix with water  . vitamin C (ASCORBIC ACID) 500 MG tablet Take 500 mg by mouth daily.  . [DISCONTINUED] Calcium Carbonate-Vit D-Min (CALCIUM 600+D3 PLUS MINERALS) 600-800 MG-UNIT TABS Take by mouth daily.   No facility-administered encounter medications on file as of 03/20/2018.     Review of Systems:  Review of Systems  Musculoskeletal: Positive for arthralgias.  All other systems reviewed and are  negative.   Health Maintenance  Topic Date Due  . MAMMOGRAM  04/30/2018  . TETANUS/TDAP  03/20/2020  . INFLUENZA VACCINE  Completed  . DEXA SCAN  Completed  . PNA vac Low Risk Adult  Completed    Physical Exam: Vitals:   03/20/18 1114  BP: 134/84  Pulse: 61  Temp: 97.8 F (36.6 C)  TempSrc: Oral  SpO2: 99%  Weight: 118 lb (53.5 kg)  Height: 5' (1.524 m)   Body mass index is 23.05 kg/m. Physical Exam  Constitutional: She appears well-developed and well-nourished.  Cardiovascular: Normal rate and regular rhythm.  Pulmonary/Chest: Effort normal and breath sounds normal.  Musculoskeletal: Normal range of motion.  Exam right shoulder: Normal range of motion Right hip there is no bruising externally.  Patient ambulates without limp.  Leg rolling and heel strike are both negative.  Observed patient  walking with cane.  Tends to walk with cane in her dominant hand rather than left hand which I expect given her injury to right hip.  We talked about proper use of cane but I suspect she will continue to use it as tolerated  Nursing note and vitals reviewed.   Labs reviewed: Basic Metabolic Panel: Recent Labs    06/12/17 0908 07/26/17 1428 08/03/17 1342 08/04/17 0629  NA 139 138  --  134*  K 4.2 4.2  --  4.3  CL 102 103  --  99*  CO2 31 25  --  24  GLUCOSE 76 88  --  109*  BUN 21 17  --  12  CREATININE 0.68 0.67 0.73 0.70  CALCIUM 9.0 9.0  --  8.5*   Liver Function Tests: Recent Labs    06/12/17 0908 07/26/17 1428  AST 21 24  ALT 16 19  ALKPHOS  --  52  BILITOT 0.5 0.5  PROT 6.5 6.2*  ALBUMIN  --  3.7   No results for input(s): LIPASE, AMYLASE in the last 8760 hours. No results for input(s): AMMONIA in the last 8760 hours. CBC: Recent Labs    06/08/17 2013 06/12/17 0908 07/26/17 1428  WBC 6.0 5.4 7.3  NEUTROABS 3.1 2,549 4.6  HGB 12.9 12.8 12.4  HCT 38.7 37.6 37.7  MCV 93.5 90.0 94.3  PLT 206 231 203   Lipid Panel: Recent Labs    06/12/17 0908    CHOL 190  HDL 86  LDLCALC 85  TRIG 96  CHOLHDL 2.2   Lab Results  Component Value Date   HGBA1C 5.3 05/02/2013    Procedures since last visit: No results found.  Assessment/Plan  1. Contusion of right hip, subsequent encounter Continue use of ice for the first 24 hours and then apply heat afterwards.  Activity as tolerated.   Labs/tests ordered:  @ORDERS @ Next appt:  06/27/2018 Lillette Boxer. Sabra Heck, [place] [address] [place], [place] Office [phone]

## 2018-03-27 DIAGNOSIS — M6281 Muscle weakness (generalized): Secondary | ICD-10-CM | POA: Diagnosis not present

## 2018-03-27 DIAGNOSIS — R2689 Other abnormalities of gait and mobility: Secondary | ICD-10-CM | POA: Diagnosis not present

## 2018-03-27 DIAGNOSIS — R2681 Unsteadiness on feet: Secondary | ICD-10-CM | POA: Diagnosis not present

## 2018-03-27 DIAGNOSIS — R296 Repeated falls: Secondary | ICD-10-CM | POA: Diagnosis not present

## 2018-03-29 ENCOUNTER — Telehealth: Payer: Self-pay

## 2018-03-29 NOTE — Telephone Encounter (Signed)
[name] with [facility] states patient has surgery on December 11th and wanted to see if patient could stop her plavix 5 days prior to surgery.   I informed [name] that there was a message in [place] on 01/23/2018 where Dr Mariea Clonts addressed this request from [place].

## 2018-04-01 DIAGNOSIS — R2689 Other abnormalities of gait and mobility: Secondary | ICD-10-CM | POA: Diagnosis not present

## 2018-04-01 DIAGNOSIS — M6281 Muscle weakness (generalized): Secondary | ICD-10-CM | POA: Diagnosis not present

## 2018-04-01 DIAGNOSIS — R2681 Unsteadiness on feet: Secondary | ICD-10-CM | POA: Diagnosis not present

## 2018-04-01 DIAGNOSIS — R296 Repeated falls: Secondary | ICD-10-CM | POA: Diagnosis not present

## 2018-04-02 DIAGNOSIS — R2689 Other abnormalities of gait and mobility: Secondary | ICD-10-CM | POA: Diagnosis not present

## 2018-04-02 DIAGNOSIS — M6281 Muscle weakness (generalized): Secondary | ICD-10-CM | POA: Diagnosis not present

## 2018-04-02 DIAGNOSIS — R296 Repeated falls: Secondary | ICD-10-CM | POA: Diagnosis not present

## 2018-04-02 DIAGNOSIS — R2681 Unsteadiness on feet: Secondary | ICD-10-CM | POA: Diagnosis not present

## 2018-04-04 ENCOUNTER — Ambulatory Visit: Payer: Self-pay | Admitting: General Surgery

## 2018-04-04 DIAGNOSIS — Z7902 Long term (current) use of antithrombotics/antiplatelets: Secondary | ICD-10-CM | POA: Diagnosis not present

## 2018-04-04 DIAGNOSIS — K219 Gastro-esophageal reflux disease without esophagitis: Secondary | ICD-10-CM | POA: Diagnosis not present

## 2018-04-04 DIAGNOSIS — K4091 Unilateral inguinal hernia, without obstruction or gangrene, recurrent: Secondary | ICD-10-CM | POA: Diagnosis not present

## 2018-04-04 DIAGNOSIS — I10 Essential (primary) hypertension: Secondary | ICD-10-CM | POA: Diagnosis not present

## 2018-04-04 NOTE — H&P (View-Only) (Signed)
[name] Documented: 04/04/2018 4:00 PM Location: [facility] Patient #: [id number] DOB: 08-16-1928 Widowed / Language: [name] / Race: White Female  History of Present Illness [name] M. Tahiri Shareef MD; 04/04/2018 5:07 PM) The patient is a 84 year old female who presents with inguinal swelling. She comes in today accompanied by a friend for additional follow-up regarding her recurrent right inguinal hernia. She thinks that it may be a little bit larger. She has some intermittent discomfort in the area but the bulge always go back in. She did fall a few weeks ago but did not sustain a fracture. Other than that she denies any medical changes since I last saw her. She did see cardiology and was cleared for surgery. She denies any chest pain, chest pressure, shortness of breath, dyspnea on exertion. She denies any TIAs or amaurosis fugax. She lives in an independent apartment in a retirement community  01/09/18 She comes in accompanied by her daughter with complaints of her recurrent bulge in her right groin. She underwent laparoscopic repair of left inguinal hernia with mesh in March as well as open repair of a right indirect inguinal hernia with mesh. She did well after surgery. I converted to open for the right hernia because of some adhesions of her cecum to the peritoneum in the right lower quadrant. She states about 2 weeks ago she noticed a recurrent bulge in her right groin. It is uncomfortable. It goes away if she is lying down. It is more bothersome standing up and with physical activity. It has never been hard or firm. She denies any nausea or vomiting. She is on Plavix. She states that she did have a series of bladder infections after her surgery in March. It took a month or 2 for it to clear up. She also states that she had some musculoskeletal issues in her foot which required a boot and her orthopedic surgeon felt that it was due to ciprofloxacin. She denies  any chest pain, chest pressure, source of breath, orthopnea, TIAs or amaurosis fugax.   Problem List/Past Medical [name] M. Redmond Pulling, MD; 04/04/2018 5:08 PM) ANTIPLATELET OR ANTITHROMBOTIC LONG-TERM USE (Z79.02) RECURRENT RIGHT INGUINAL HERNIA (K40.91)  Past Surgical History [name] M. Redmond Pulling, MD; 04/04/2018 5:08 PM) Appendectomy Cataract Surgery Bilateral. Foot Surgery Left. Hysterectomy (not due to cancer) - Complete Tonsillectomy  Diagnostic Studies History [name] M. Redmond Pulling, MD; 04/04/2018 5:08 PM) Colonoscopy 5-10 years ago Mammogram within last year Pap Smear >5 years ago  Allergies Geni Bers [place], RMA; 04/04/2018 4:00 PM) Sulfa Drugs Penicillins Erythromycin *DERMATOLOGICALS* Macrodantin *URINARY ANTI-INFECTIVES* Trimethoprim *ANTI-INFECTIVE AGENTS - MISC.* Latex Exam Gloves *MEDICAL DEVICES AND SUPPLIES* Allergies Reconciled  Medication History [employer], RMA; 04/04/2018 4:01 PM) Multivitamin Adult (Oral) Active. Vitamin C (500MG  Tablet, Oral) Active. Cranberry Concentrate (500MG  Capsule, Oral) Active. Metoprolol Succinate ER (25MG  Tablet ER 24HR, Oral) Active. Lumigan (0.01% Solution, Ophthalmic) Active. Eye Wash ([place]) (Ophthalmic) Active. Vitamin D3 (Oral) Specific strength unknown - Active. Ranitidine (Oral) Specific strength unknown - Active. Tylenol PM Extra Strength (500-25MG  Tablet, Oral) Active. Clarinex (Oral) Specific strength unknown - Active. Clopidogrel Bisulfate (75MG  Tablet, Oral) Active. Psyllium (Oral) Specific strength unknown - Active. Famotidine (10MG  Tablet, Oral) Active. Caltrate 600 (1500 (600 Ca)MG Tablet, Oral) Active. Medications Reconciled  Social History [name] M. Redmond Pulling, MD; 04/04/2018 5:08 PM) Alcohol use Occasional alcohol use. Caffeine use Carbonated beverages, Tea. No drug use Tobacco use Never smoker.  Family History [name] M. Redmond Pulling, MD; 04/04/2018 5:08 PM) Colon Cancer  Father.  Depression Father. Heart Disease Mother, Son.  Pregnancy / Birth History [name] M. Redmond Pulling, MD; 04/04/2018 5:08 PM) Age at menarche 73 years. Age of menopause <47 Gravida 2 Length (months) of breastfeeding 7-12 Maternal age 82-30 Para 2 Regular periods  Other Problems [name] M. Redmond Pulling, MD; 04/04/2018 5:08 PM) Back Pain Bladder Problems Cerebrovascular Accident Chest pain Inguinal Hernia Oophorectomy Right. GASTROESOPHAGEAL REFLUX DISEASE, ESOPHAGITIS PRESENCE NOT SPECIFIED (K21.9) HYPERTENSION, ESSENTIAL (I10)     Review of Systems [name] M. Loyalty Arentz MD; 04/04/2018 5:07 PM) All other systems negative  Vitals (Jacqueline Haggett RMA; 04/04/2018 4:02 PM) 04/04/2018 4:02 PM Weight: 128.6 lb Height: 60in Body Surface Area: 1.55 m Body Mass Index: 25.12 kg/m  Temp.: 96.68F(Temporal)  Pulse: 75 (Regular)  P.OX: 99% (Room air) BP: 142/70 (Sitting, Left Arm, Standard)      Physical Exam [name] M. Rastus Borton MD; 04/04/2018 5:08 PM)  General Mental Status-Alert. General Appearance-Consistent with stated age. Hydration-Well hydrated. Voice-Normal.  Head and Neck Head-normocephalic, atraumatic with no lesions or palpable masses. Trachea-midline. Thyroid Gland Characteristics - normal size and consistency.  Eye Eyeball - Bilateral-Normal. Sclera/Conjunctiva - Bilateral-No scleral icterus.  ENMT Ears -Note:normal ext ears.  Mouth and Throat -Note:lips intact.   Chest and Lung Exam Chest and lung exam reveals -quiet, even and easy respiratory effort with no use of accessory muscles and on auscultation, normal breath sounds, no adventitious sounds and normal vocal resonance. Inspection Chest Wall - Normal. Back - normal.  Breast - Did not examine.  Cardiovascular Cardiovascular examination reveals -normal heart sounds, regular rate and rhythm with no murmurs and normal pedal pulses  bilaterally.  Abdomen Inspection Inspection of the abdomen reveals - No Hernias. Skin - Scar - Note: well healed trocar scars; old appy scar. Palpation/Percussion Palpation and Percussion of the abdomen reveal - Soft, Non Tender, No Rebound tenderness, No Rigidity (guarding) and No hepatosplenomegaly. Auscultation Auscultation of the abdomen reveals - Bowel sounds normal. Note: On standing she has a bulge about an inch and a half inferior to her right inguinal incision. It is reducible. Fairly nontender.  Peripheral Vascular Upper Extremity Palpation - Pulses bilaterally normal.  Neurologic Neurologic evaluation reveals -alert and oriented x 3 with no impairment of recent or remote memory. Mental Status-Normal.  Neuropsychiatric The patient's mood and affect are described as -normal. Judgment and Insight-insight is appropriate concerning matters relevant to self.  Musculoskeletal Normal Exam - Left-Upper Extremity Strength Normal and Lower Extremity Strength Normal. Normal Exam - Right-Upper Extremity Strength Normal and Lower Extremity Strength Normal.  Lymphatic Head & Neck  General Head & Neck Lymphatics: Bilateral - Description - Normal. Axillary - Did not examine. Femoral & Inguinal - Did not examine.    Assessment & Plan [name] M. Kaleena Corrow MD; 04/04/2018 5:09 PM)  RECURRENT RIGHT INGUINAL HERNIA (K40.91) Impression: I don't believe her recurrent hernia is larger. We rediscussed signs and symptoms of incarceration and strangulation. She was given Neurosurgeon. The plan is to proceed with laparoscopic repair of recurrent right inguinal hernia with mesh  The patient has elected to proceed with [place].   I described the procedure in detail. The patient was given educational material. We discussed the risks and benefits including but not limited to bleeding, infection, chronic inguinal pain,  nerve entrapment, hernia recurrence, mesh complications, hematoma formation, urinary retention, injury to the ovaries, numbness in the groin, blood clots, injury to the surrounding structures, and anesthesia risk. We also discussed the typical post operative recovery  course, including no heavy lifting for 4-6 weeks. I explained that the likelihood of improvement of their symptoms is good  Current Plans Pt Education - Pamphlet Given - Laparoscopic Hernia Repair: discussed with patient and provided information.  ANTIPLATELET OR ANTITHROMBOTIC LONG-TERM USE (Z79.02) Impression: Hold Plavix perioperatively   HYPERTENSION, ESSENTIAL (I10)   GASTROESOPHAGEAL REFLUX DISEASE, ESOPHAGITIS PRESENCE NOT SPECIFIED (K21.9)  Leighton Ruff. Redmond Pulling, MD, FACS General, Bariatric, & Minimally Invasive Surgery [facility], [place]

## 2018-04-04 NOTE — H&P (Signed)
[name] Documented: 04/04/2018 4:00 PM Location: [facility] Patient #: [id number] DOB: [birth year] Widowed / Language: [name] / Race: White Female  History of Present Illness [name] M. Jermario Kalmar MD; 04/04/2018 5:07 PM) The patient is a 84 year old female who presents with inguinal swelling. She comes in today accompanied by a friend for additional follow-up regarding her recurrent right inguinal hernia. She thinks that it may be a little bit larger. She has some intermittent discomfort in the area but the bulge always go back in. She did fall a few weeks ago but did not sustain a fracture. Other than that she denies any medical changes since I last saw her. She did see cardiology and was cleared for surgery. She denies any chest pain, chest pressure, shortness of breath, dyspnea on exertion. She denies any TIAs or amaurosis fugax. She lives in an independent apartment in a retirement community  01/09/18 She comes in accompanied by her daughter with complaints of her recurrent bulge in her right groin. She underwent laparoscopic repair of left inguinal hernia with mesh in March as well as open repair of a right indirect inguinal hernia with mesh. She did well after surgery. I converted to open for the right hernia because of some adhesions of her cecum to the peritoneum in the right lower quadrant. She states about 2 weeks ago she noticed a recurrent bulge in her right groin. It is uncomfortable. It goes away if she is lying down. It is more bothersome standing up and with physical activity. It has never been hard or firm. She denies any nausea or vomiting. She is on Plavix. She states that she did have a series of bladder infections after her surgery in March. It took a month or 2 for it to clear up. She also states that she had some musculoskeletal issues in her foot which required a boot and her orthopedic surgeon felt that it was due to ciprofloxacin. She denies  any chest pain, chest pressure, source of breath, orthopnea, TIAs or amaurosis fugax.   Problem List/Past Medical [name] M. Redmond Pulling, MD; 04/04/2018 5:08 PM) ANTIPLATELET OR ANTITHROMBOTIC LONG-TERM USE (Z79.02) RECURRENT RIGHT INGUINAL HERNIA (K40.91)  Past Surgical History [name] M. Redmond Pulling, MD; 04/04/2018 5:08 PM) Appendectomy Cataract Surgery Bilateral. Foot Surgery Left. Hysterectomy (not due to cancer) - Complete Tonsillectomy  Diagnostic Studies History [name] M. Redmond Pulling, MD; 04/04/2018 5:08 PM) Colonoscopy 5-10 years ago Mammogram within last year Pap Smear >5 years ago  Allergies Geni Bers Granger, RMA; 04/04/2018 4:00 PM) Sulfa Drugs Penicillins Erythromycin *DERMATOLOGICALS* Macrodantin *URINARY ANTI-INFECTIVES* Trimethoprim *ANTI-INFECTIVE AGENTS - MISC.* Latex Exam Gloves *MEDICAL DEVICES AND SUPPLIES* Allergies Reconciled  Medication History [employer], RMA; 04/04/2018 4:01 PM) Multivitamin Adult (Oral) Active. Vitamin C (500MG  Tablet, Oral) Active. Cranberry Concentrate (500MG  Capsule, Oral) Active. Metoprolol Succinate ER (25MG  Tablet ER 24HR, Oral) Active. Lumigan (0.01% Solution, Ophthalmic) Active. Eye Wash ([place]) (Ophthalmic) Active. Vitamin D3 (Oral) Specific strength unknown - Active. Ranitidine (Oral) Specific strength unknown - Active. Tylenol PM Extra Strength (500-25MG  Tablet, Oral) Active. Clarinex (Oral) Specific strength unknown - Active. Clopidogrel Bisulfate (75MG  Tablet, Oral) Active. Psyllium (Oral) Specific strength unknown - Active. Famotidine (10MG  Tablet, Oral) Active. Caltrate 600 (1500 (600 Ca)MG Tablet, Oral) Active. Medications Reconciled  Social History [name] M. Redmond Pulling, MD; 04/04/2018 5:08 PM) Alcohol use Occasional alcohol use. Caffeine use Carbonated beverages, Tea. No drug use Tobacco use Never smoker.  Family History [name] M. Redmond Pulling, MD; 04/04/2018 5:08 PM) Colon Cancer  Father.  Depression Father. Heart Disease Mother, Son.  Pregnancy / Birth History [name] M. Redmond Pulling, MD; 04/04/2018 5:08 PM) Age at menarche 71 years. Age of menopause <47 Gravida 2 Length (months) of breastfeeding 7-12 Maternal age 49-30 Para 2 Regular periods  Other Problems [name] M. Redmond Pulling, MD; 04/04/2018 5:08 PM) Back Pain Bladder Problems Cerebrovascular Accident Chest pain Inguinal Hernia Oophorectomy Right. GASTROESOPHAGEAL REFLUX DISEASE, ESOPHAGITIS PRESENCE NOT SPECIFIED (K21.9) HYPERTENSION, ESSENTIAL (I10)     Review of Systems [name] M. Bernarda Erck MD; 04/04/2018 5:07 PM) All other systems negative  Vitals (Jacqueline Haggett RMA; 04/04/2018 4:02 PM) 04/04/2018 4:02 PM Weight: 128.6 lb Height: 60in Body Surface Area: 1.55 m Body Mass Index: 25.12 kg/m  Temp.: 96.55F(Temporal)  Pulse: 75 (Regular)  P.OX: 99% (Room air) BP: 142/70 (Sitting, Left Arm, Standard)      Physical Exam [name] M. Sheryle Vice MD; 04/04/2018 5:08 PM)  General Mental Status-Alert. General Appearance-Consistent with stated age. Hydration-Well hydrated. Voice-Normal.  Head and Neck Head-normocephalic, atraumatic with no lesions or palpable masses. Trachea-midline. Thyroid Gland Characteristics - normal size and consistency.  Eye Eyeball - Bilateral-Normal. Sclera/Conjunctiva - Bilateral-No scleral icterus.  ENMT Ears -Note:normal ext ears.  Mouth and Throat -Note:lips intact.   Chest and Lung Exam Chest and lung exam reveals -quiet, even and easy respiratory effort with no use of accessory muscles and on auscultation, normal breath sounds, no adventitious sounds and normal vocal resonance. Inspection Chest Wall - Normal. Back - normal.  Breast - Did not examine.  Cardiovascular Cardiovascular examination reveals -normal heart sounds, regular rate and rhythm with no murmurs and normal pedal pulses  bilaterally.  Abdomen Inspection Inspection of the abdomen reveals - No Hernias. Skin - Scar - Note: well healed trocar scars; old appy scar. Palpation/Percussion Palpation and Percussion of the abdomen reveal - Soft, Non Tender, No Rebound tenderness, No Rigidity (guarding) and No hepatosplenomegaly. Auscultation Auscultation of the abdomen reveals - Bowel sounds normal. Note: On standing she has a bulge about an inch and a half inferior to her right inguinal incision. It is reducible. Fairly nontender.  Peripheral Vascular Upper Extremity Palpation - Pulses bilaterally normal.  Neurologic Neurologic evaluation reveals -alert and oriented x 3 with no impairment of recent or remote memory. Mental Status-Normal.  Neuropsychiatric The patient's mood and affect are described as -normal. Judgment and Insight-insight is appropriate concerning matters relevant to self.  Musculoskeletal Normal Exam - Left-Upper Extremity Strength Normal and Lower Extremity Strength Normal. Normal Exam - Right-Upper Extremity Strength Normal and Lower Extremity Strength Normal.  Lymphatic Head & Neck  General Head & Neck Lymphatics: Bilateral - Description - Normal. Axillary - Did not examine. Femoral & Inguinal - Did not examine.    Assessment & Plan [name] M. Carmelia Tiner MD; 04/04/2018 5:09 PM)  RECURRENT RIGHT INGUINAL HERNIA (K40.91) Impression: I don't believe her recurrent hernia is larger. We rediscussed signs and symptoms of incarceration and strangulation. She was given Neurosurgeon. The plan is to proceed with laparoscopic repair of recurrent right inguinal hernia with mesh  The patient has elected to proceed with [place].   I described the procedure in detail. The patient was given educational material. We discussed the risks and benefits including but not limited to bleeding, infection, chronic inguinal pain,  nerve entrapment, hernia recurrence, mesh complications, hematoma formation, urinary retention, injury to the ovaries, numbness in the groin, blood clots, injury to the surrounding structures, and anesthesia risk. We also discussed the typical post operative recovery  course, including no heavy lifting for 4-6 weeks. I explained that the likelihood of improvement of their symptoms is good  Current Plans Pt Education - Pamphlet Given - Laparoscopic Hernia Repair: discussed with patient and provided information.  ANTIPLATELET OR ANTITHROMBOTIC LONG-TERM USE (Z79.02) Impression: Hold Plavix perioperatively   HYPERTENSION, ESSENTIAL (I10)   GASTROESOPHAGEAL REFLUX DISEASE, ESOPHAGITIS PRESENCE NOT SPECIFIED (K21.9)  Leighton Ruff. Redmond Pulling, MD, FACS General, Bariatric, & Minimally Invasive Surgery [facility], [place]

## 2018-04-08 DIAGNOSIS — R2689 Other abnormalities of gait and mobility: Secondary | ICD-10-CM | POA: Diagnosis not present

## 2018-04-08 DIAGNOSIS — R2681 Unsteadiness on feet: Secondary | ICD-10-CM | POA: Diagnosis not present

## 2018-04-08 DIAGNOSIS — R296 Repeated falls: Secondary | ICD-10-CM | POA: Diagnosis not present

## 2018-04-08 DIAGNOSIS — M6281 Muscle weakness (generalized): Secondary | ICD-10-CM | POA: Diagnosis not present

## 2018-04-09 DIAGNOSIS — R296 Repeated falls: Secondary | ICD-10-CM | POA: Diagnosis not present

## 2018-04-09 DIAGNOSIS — R2689 Other abnormalities of gait and mobility: Secondary | ICD-10-CM | POA: Diagnosis not present

## 2018-04-09 DIAGNOSIS — M6281 Muscle weakness (generalized): Secondary | ICD-10-CM | POA: Diagnosis not present

## 2018-04-09 DIAGNOSIS — R2681 Unsteadiness on feet: Secondary | ICD-10-CM | POA: Diagnosis not present

## 2018-04-09 NOTE — Progress Notes (Signed)
ekg 01-17-18 epic  lov note and clearance cardiology Fabian Sharp PA 01-17-18 in epic

## 2018-04-09 NOTE — Patient Instructions (Signed)
Aviendha Azbell EMILYN RUBLE  04/09/2018   Your procedure is scheduled on: 04-24-18   Report to [facility]  Entr[facility]             Report to admitting at     [facility]  AM    Call this number if you have problems the morning of surgery [phone]    Remember: Do not eat food :After Midnight.  NO SOLID FOOD AFTER MIDNIGHT THE NIGHT PRIOR TO SURGERY. NOTHING BY MOUTH EXCEPT CLEAR LIQUIDS UNTIL 3 HOURS PRIOR TO [place] SURGERY. PLEASE FINISH ENSURE DRINK PER SURGEON ORDER 3 HOURS PRIOR TO SCHEDULED SURGERY TIME WHICH NEEDS TO BE COMPLETED AT ___0530 am then nothing by mouth_________.   BRUSH YOUR TEETH MORNING OF SURGERY AND RINSE YOUR MOUTH OUT, NO CHEWING GUM CANDY OR MINTS.     Take these medicines the morning of surgery with A SIP OF WATER: metoprolol, tylenol if needed                                You may not have any metal on your body including hair pins and              piercings  Do not wear jewelry, make-up, lotions, powders or perfumes, deodorant             Do not wear nail polish.  Do not shave  48 hours prior to surgery.     Do not bring valuables to the hospital. [place].  Contacts, dentures or bridgework may not be worn into surgery.  Leave suitcase in the car. After surgery it may be brought to your room.                  Please read over the following fact sheets you were given: _____________________________________________________________________             [facility] - Preparing for Surgery Before surgery, you can play an important role.  Because skin is not sterile, your skin needs to be as free of germs as possible.  You can reduce the number of germs on your skin by washing with CHG (chlorahexidine gluconate) soap before surgery.  CHG is an antiseptic cleaner which kills germs and bonds with the skin to continue killing germs even after washing. Please DO NOT use if you have an  allergy to CHG or antibacterial soaps.  If your skin becomes reddened/irritated stop using the CHG and inform your nurse when you arrive at Short Stay. Do not shave (including legs and underarms) for at least 48 hours prior to the first CHG shower.  You may shave your face/neck. Please follow these instructions carefully:  1.  Shower with CHG Soap the night before surgery and the  morning of Surgery.  2.  If you choose to wash your hair, wash your hair first as usual with your  normal  shampoo.  3.  After you shampoo, rinse your hair and body thoroughly to remove the  shampoo.                           4.  Use CHG as you would any other liquid soap.  You  can apply chg directly  to the skin and wash                       Gently with a scrungie or clean washcloth.  5.  Apply the CHG Soap to your body ONLY FROM THE NECK DOWN.   Do not use on face/ open                           Wound or open sores. Avoid contact with eyes, ears mouth and genitals (private parts).                       Wash face,  Genitals (private parts) with your normal soap.             6.  Wash thoroughly, paying special attention to the area where your surgery  will be performed.  7.  Thoroughly rinse your body with warm water from the neck down.  8.  DO NOT shower/wash with your normal soap after using and rinsing off  the CHG Soap.                9.  Pat yourself dry with a clean towel.            10.  Wear clean pajamas.            11.  Place clean sheets on your bed the night of your first shower and do not  sleep with pets. Day of Surgery : Do not apply any lotions/deodorants the morning of surgery.  Please wear clean clothes to the hospital/surgery center.  FAILURE TO FOLLOW THESE INSTRUCTIONS MAY RESULT IN THE CANCELLATION OF YOUR SURGERY PATIENT SIGNATURE_________________________________  NURSE SIGNATURE__________________________________  ________________________________________________________________________

## 2018-04-10 ENCOUNTER — Other Ambulatory Visit: Payer: Self-pay | Admitting: Internal Medicine

## 2018-04-10 DIAGNOSIS — R2681 Unsteadiness on feet: Secondary | ICD-10-CM | POA: Diagnosis not present

## 2018-04-10 DIAGNOSIS — G459 Transient cerebral ischemic attack, unspecified: Secondary | ICD-10-CM

## 2018-04-10 DIAGNOSIS — R2689 Other abnormalities of gait and mobility: Secondary | ICD-10-CM | POA: Diagnosis not present

## 2018-04-10 DIAGNOSIS — M6281 Muscle weakness (generalized): Secondary | ICD-10-CM | POA: Diagnosis not present

## 2018-04-10 DIAGNOSIS — R296 Repeated falls: Secondary | ICD-10-CM | POA: Diagnosis not present

## 2018-04-15 ENCOUNTER — Encounter (HOSPITAL_COMMUNITY): Payer: Self-pay

## 2018-04-15 ENCOUNTER — Encounter (HOSPITAL_COMMUNITY)
Admission: RE | Admit: 2018-04-15 | Discharge: 2018-04-15 | Disposition: A | Discharge status: Home / Self Care | Payer: Medicare Other | Source: Ambulatory Visit | Attending: General Surgery | Admitting: General Surgery

## 2018-04-15 ENCOUNTER — Other Ambulatory Visit: Payer: Self-pay

## 2018-04-15 DIAGNOSIS — K4091 Unilateral inguinal hernia, without obstruction or gangrene, recurrent: Secondary | ICD-10-CM | POA: Diagnosis not present

## 2018-04-15 DIAGNOSIS — R296 Repeated falls: Secondary | ICD-10-CM | POA: Diagnosis not present

## 2018-04-15 DIAGNOSIS — Z01812 Encounter for preprocedural laboratory examination: Secondary | ICD-10-CM | POA: Insufficient documentation

## 2018-04-15 DIAGNOSIS — R2681 Unsteadiness on feet: Secondary | ICD-10-CM | POA: Diagnosis not present

## 2018-04-15 DIAGNOSIS — M6281 Muscle weakness (generalized): Secondary | ICD-10-CM | POA: Diagnosis not present

## 2018-04-15 DIAGNOSIS — R2689 Other abnormalities of gait and mobility: Secondary | ICD-10-CM | POA: Diagnosis not present

## 2018-04-15 HISTORY — DX: Transient cerebral ischemic attack, unspecified: G45.9

## 2018-04-15 LAB — CBC WITH DIFFERENTIAL/PLATELET
ABS IMMATURE GRANULOCYTES: 0.03 10*3/uL (ref 0.00–0.07)
BASOS PCT: 1 %
Basophils Absolute: 0.1 10*3/uL (ref 0.0–0.1)
Eosinophils Absolute: 0.3 10*3/uL (ref 0.0–0.5)
Eosinophils Relative: 3 %
HCT: 42.6 % (ref 36.0–46.0)
Hemoglobin: 13.1 g/dL (ref 12.0–15.0)
Immature Granulocytes: 0 %
Lymphocytes Relative: 27 %
Lymphs Abs: 2.2 10*3/uL (ref 0.7–4.0)
MCH: 29.8 pg (ref 26.0–34.0)
MCHC: 30.8 g/dL (ref 30.0–36.0)
MCV: 97 fL (ref 80.0–100.0)
Monocytes Absolute: 0.6 10*3/uL (ref 0.1–1.0)
Monocytes Relative: 8 %
Neutro Abs: 5 10*3/uL (ref 1.7–7.7)
Neutrophils Relative %: 61 %
PLATELETS: 218 10*3/uL (ref 150–400)
RBC: 4.39 MIL/uL (ref 3.87–5.11)
RDW: 14.6 % (ref 11.5–15.5)
WBC: 8.2 10*3/uL (ref 4.0–10.5)
nRBC: 0 % (ref 0.0–0.2)

## 2018-04-15 LAB — COMPREHENSIVE METABOLIC PANEL
ALT: 21 U/L (ref 0–44)
AST: 24 U/L (ref 15–41)
Albumin: 4.7 g/dL (ref 3.5–5.0)
Alkaline Phosphatase: 59 U/L (ref 38–126)
Anion gap: 7 (ref 5–15)
BUN: 24 mg/dL — ABNORMAL HIGH (ref 8–23)
CO2: 30 mmol/L (ref 22–32)
Calcium: 9.6 mg/dL (ref 8.9–10.3)
Chloride: 101 mmol/L (ref 98–111)
Creatinine, Ser: 0.59 mg/dL (ref 0.44–1.00)
GFR calc Af Amer: >60 mL/min (ref >60)
GFR calc non Af Amer: >60 mL/min (ref >60)
Glucose, Bld: 91 mg/dL (ref 70–99)
Potassium: 3.8 mmol/L (ref 3.5–5.1)
Sodium: 138 mmol/L (ref 135–145)
Total Bilirubin: 0.5 mg/dL (ref 0.3–1.2)
Total Protein: 6.9 g/dL (ref 6.5–8.1)

## 2018-04-15 NOTE — Progress Notes (Signed)
cmp routed to Dr. Redmond Pulling via epic

## 2018-04-16 DIAGNOSIS — M6281 Muscle weakness (generalized): Secondary | ICD-10-CM | POA: Diagnosis not present

## 2018-04-16 DIAGNOSIS — R296 Repeated falls: Secondary | ICD-10-CM | POA: Diagnosis not present

## 2018-04-16 DIAGNOSIS — R2689 Other abnormalities of gait and mobility: Secondary | ICD-10-CM | POA: Diagnosis not present

## 2018-04-16 DIAGNOSIS — R2681 Unsteadiness on feet: Secondary | ICD-10-CM | POA: Diagnosis not present

## 2018-04-17 DIAGNOSIS — H353122 Nonexudative age-related macular degeneration, left eye, intermediate dry stage: Secondary | ICD-10-CM | POA: Diagnosis not present

## 2018-04-17 DIAGNOSIS — H43813 Vitreous degeneration, bilateral: Secondary | ICD-10-CM | POA: Diagnosis not present

## 2018-04-18 DIAGNOSIS — R2681 Unsteadiness on feet: Secondary | ICD-10-CM | POA: Diagnosis not present

## 2018-04-18 DIAGNOSIS — R296 Repeated falls: Secondary | ICD-10-CM | POA: Diagnosis not present

## 2018-04-18 DIAGNOSIS — R2689 Other abnormalities of gait and mobility: Secondary | ICD-10-CM | POA: Diagnosis not present

## 2018-04-18 DIAGNOSIS — M6281 Muscle weakness (generalized): Secondary | ICD-10-CM | POA: Diagnosis not present

## 2018-04-22 DIAGNOSIS — R2681 Unsteadiness on feet: Secondary | ICD-10-CM | POA: Diagnosis not present

## 2018-04-22 DIAGNOSIS — R2689 Other abnormalities of gait and mobility: Secondary | ICD-10-CM | POA: Diagnosis not present

## 2018-04-22 DIAGNOSIS — R296 Repeated falls: Secondary | ICD-10-CM | POA: Diagnosis not present

## 2018-04-22 DIAGNOSIS — M6281 Muscle weakness (generalized): Secondary | ICD-10-CM | POA: Diagnosis not present

## 2018-04-23 ENCOUNTER — Encounter: Payer: Self-pay | Admitting: Cardiovascular Disease

## 2018-04-23 ENCOUNTER — Ambulatory Visit (INDEPENDENT_AMBULATORY_CARE_PROVIDER_SITE_OTHER): Payer: Medicare Other | Admitting: Cardiovascular Disease

## 2018-04-23 DIAGNOSIS — I10 Essential (primary) hypertension: Secondary | ICD-10-CM | POA: Diagnosis not present

## 2018-04-23 DIAGNOSIS — E7800 Pure hypercholesterolemia, unspecified: Secondary | ICD-10-CM | POA: Diagnosis not present

## 2018-04-23 MED ORDER — BUPIVACAINE LIPOSOME 1.3 % IJ SUSP
20.0000 mL | INTRAMUSCULAR | Status: DC
  Filled 2018-04-23: qty 20

## 2018-04-23 NOTE — Assessment & Plan Note (Signed)
History of hyperlipidemia not on statin therapy with lipid profile performed 06/12/2017 revealing a total cholesterol of 190, LDL of 85 and HDL of 86.

## 2018-04-23 NOTE — Patient Instructions (Signed)
Medication Instructions: NONE  If you need a refill on your cardiac medications before your next appointment, please call your pharmacy.   Lab work: NONE  If you have labs (blood work) drawn today and your tests are completely normal, you will receive your results only by: Marland Kitchen MyChart Message (if you have MyChart) OR . A paper copy in the mail If you have any lab test that is abnormal or we need to change your treatment, we will call you to review the results.  Testing/Procedures: NONE  Follow-Up: At [facility], you and your health needs are our priority.  As part of our continuing mission to provide you with exceptional heart care, we have created designated Provider Care Teams.  These Care Teams include your primary Cardiologist (physician) and Advanced Practice Providers (APPs -  Physician Assistants and Nurse Practitioners) who all work together to provide you with the care you need, when you need it. You will need a follow up appointment in 12 months.  Please call our office 2 months in advance to schedule this appointment.  You may see Quay Burow, MD or one of the following Advanced Practice Providers on your designated Care Team:   Kerin Ransom, PA-C Roby Lofts, [place] . Sande Rives, PA-C

## 2018-04-23 NOTE — Progress Notes (Signed)
04/23/2018 [place]   [birth year]  [id number]  Primary Physician Gayland Curry, DO Primary Cardiologist: Lorretta Harp MD FACP, [place], [place], [place]  HPI:  [name] is a 84 y.o.  thin appearing widowed Caucasian female mother of 2, grandmother of 2 grandchildren who I last sawin the office  04/17/2017.Marland Kitchen Her only symptoms at that time were palpitations on a low-dose beta blocker. She also had a history of GERD. She was admitted to [facility] on January 06, 2013 with chest pain. She ruled out for myocardial infarction. She had a Myoview stress test which was normal. She had no recurrent symptoms.she also had TIA type symptoms last year and was diagnosed with a small "cerebral aneurysm "conservative medical therapy was recommended. She follows up with a neurologist and has been asymptomatic. She does have left shoulder issues and apparently since seen Dr. Onnie Graham was recommended total shoulder replacement.Since I saw her a year ago she is remaining clinically stable. She denies chest pain or shortness of breath. She was in [place]   2 years ago at her granddaughter's wedding and wishes to return.  Since I saw her a year ago she is remained stable.  She denies chest pain or shortness of breath.  She is scheduled for elective inguinal hernia repair tomorrow.   Current Meds  Medication Sig  . acetaminophen (TYLENOL) 500 MG tablet Take 500 mg by mouth 2 (two) times daily as needed for mild pain or headache.   . bacitracin-polymyxin b (POLYSPORIN) ointment Apply 1 application topically daily as needed (for wound care).   . bimatoprost (LUMIGAN) 0.01 % SOLN Place 1 drop into both eyes at bedtime.  . Calcium Carbonate-Vitamin D 600-400 MG-UNIT chew tablet Chew 1 tablet by mouth 2 (two) times daily.   . clopidogrel (PLAVIX) 75 MG tablet TAKE 1 TABLET ONCE A DAY TO PREVENT STROKE.  Marland Kitchen CRANBERRY EXTRACT PO Take 1 tablet by mouth daily.  . famotidine (PEPCID AC) 10 MG chewable  tablet Chew 10 mg by mouth at bedtime as needed for heartburn.  . hydrocortisone cream 1 % Apply 1 application topically daily as needed for itching.   . metoprolol succinate (TOPROL-XL) 25 MG 24 hr tablet Take 1 tablet (25 mg total) by mouth daily.  . Multiple Vitamins-Minerals (PRESERVISION AREDS 2 PO) Take 1 capsule by mouth 2 (two) times daily.   Vladimir Faster Glycol-Propyl Glycol (SYSTANE ULTRA) 0.4-0.3 % SOLN Place 1 vial into both eyes 2 (two) times daily. May use another dose during the day as needed for dry eye   . psyllium (METAMUCIL) 58.6 % packet Take 1 packet by mouth daily. Mix with water  . vitamin C (ASCORBIC ACID) 500 MG tablet Take 500 mg by mouth daily with supper.      Allergies  Allergen Reactions  . Trimethoprim Other (See Comments)    Headaches/ ear pressure   . Erythromycin Other (See Comments)    UNSPECIFIED REACTION   . Macrodantin [Nitrofurantoin Macrocrystal] Other (See Comments)    UNSPECIFIED REACTION   . Penicillins Other (See Comments)    UNSPECIFIED REACTION  Has patient had a PCN reaction causing immediate rash, facial/tongue/throat swelling, SOB or lightheadedness with hypotension: Unknown Has patient had a PCN reaction causing severe rash involving mucus membranes or skin necrosis: Unknown Has patient had a PCN reaction that required hospitalization: No Has patient had a PCN reaction occurring within the last 10 years: No If all of the above answers  are "NO", then may proceed with Cephalosporin use.  . Sulfa Antibiotics Other (See Comments)    UNSPECIFIED REACTION   . Latex Itching    Social History   Socioeconomic History  . Marital status: Widowed    Spouse name: Not on file  . Number of children: 2  . Years of education: college  . Highest education level: Not on file  Occupational History    Comment: retired  Scientific laboratory technician  . Financial resource strain: Not hard at all  . Food insecurity:    Worry: Never true    Inability: Never true  .  Transportation needs:    Medical: No    Non-medical: No  Tobacco Use  . Smoking status: Never Smoker  . Smokeless tobacco: Never Used  Substance and Sexual Activity  . Alcohol use: No  . Drug use: No  . Sexual activity: Not Currently  Lifestyle  . Physical activity:    Days per week: 3 days    Minutes per session: 30 min  . Stress: Only a little  Relationships  . Social connections:    Talks on phone: More than three times a week    Gets together: More than three times a week    Attends religious service: More than 4 times per year    Active member of club or organization: Yes    Attends meetings of clubs or organizations: More than 4 times per year    Relationship status: Widowed  . Intimate partner violence:    Fear of current or ex partner: No    Emotionally abused: No    Physically abused: No    Forced sexual activity: No  Other Topics Concern  . Not on file  Social History Narrative   Patient lives at home with her daughter [name]) , moved to [facility] 02/02/16 [place]    Widowed.   Retired.   EducationNurse, mental health.   Right handed.   Caffeine- None some times tea very rare.   Never smoked   Alcohol none     Review of Systems: General: negative for chills, fever, night sweats or weight changes.  Cardiovascular: negative for chest pain, dyspnea on exertion, edema, orthopnea, palpitations, paroxysmal nocturnal dyspnea or shortness of breath Dermatological: negative for rash Respiratory: negative for cough or wheezing Urologic: negative for hematuria Abdominal: negative for nausea, vomiting, diarrhea, bright red blood per rectum, melena, or hematemesis Neurologic: negative for visual changes, syncope, or dizziness All other systems reviewed and are otherwise negative except as noted above.    Blood pressure 120/66, pulse 62, height 5' (1.524 m), weight 130 lb 3.2 oz (59.1 kg), SpO2 100 %.  General appearance: alert and no distress Neck: no adenopathy, no  carotid bruit, no JVD, supple, symmetrical, trachea midline and thyroid not enlarged, symmetric, no tenderness/mass/nodules Lungs: clear to auscultation bilaterally Heart: regular rate and rhythm, S1, S2 normal, no murmur, click, rub or gallop Extremities: extremities normal, atraumatic, no cyanosis or edema Pulses: 2+ and symmetric Skin: Skin color, texture, turgor normal. No rashes or lesions Neurologic: Alert and oriented X 3, normal strength and tone. Normal symmetric reflexes. Normal coordination and gait  EKG not performed today  ASSESSMENT AND PLAN:   Essential hypertension History of essential hypertension her blood pressure measured today at 120/66.  She is on metoprolol.  Continue current meds at current dosing.  Hyperlipidemia History of hyperlipidemia not on statin therapy with lipid profile performed 06/12/2017 revealing a total cholesterol of 190, LDL of  85 and HDL of 86.      Lorretta Harp MD FACP,FACC,FAHA, [facility] 04/23/2018 12:01 PM

## 2018-04-23 NOTE — Assessment & Plan Note (Signed)
History of essential hypertension her blood pressure measured today at 120/66.  She is on metoprolol.  Continue current meds at current dosing.

## 2018-04-24 ENCOUNTER — Ambulatory Visit (HOSPITAL_COMMUNITY): Payer: Medicare Other | Admitting: Registered Nurse

## 2018-04-24 ENCOUNTER — Encounter (HOSPITAL_COMMUNITY)
Admission: RE | Disposition: A | Discharge status: Home / Self Care | Payer: Self-pay | Source: Ambulatory Visit | Attending: General Surgery

## 2018-04-24 ENCOUNTER — Other Ambulatory Visit: Payer: Self-pay

## 2018-04-24 ENCOUNTER — Encounter (HOSPITAL_COMMUNITY): Payer: Self-pay

## 2018-04-24 ENCOUNTER — Observation Stay (HOSPITAL_COMMUNITY)
Admission: RE | Admit: 2018-04-24 | Discharge: 2018-04-25 | Disposition: A | Discharge status: Home / Self Care | Payer: Medicare Other | Source: Ambulatory Visit | Attending: General Surgery | Admitting: General Surgery

## 2018-04-24 DIAGNOSIS — I10 Essential (primary) hypertension: Secondary | ICD-10-CM | POA: Insufficient documentation

## 2018-04-24 DIAGNOSIS — Z8673 Personal history of transient ischemic attack (TIA), and cerebral infarction without residual deficits: Secondary | ICD-10-CM | POA: Insufficient documentation

## 2018-04-24 DIAGNOSIS — Z7902 Long term (current) use of antithrombotics/antiplatelets: Secondary | ICD-10-CM | POA: Insufficient documentation

## 2018-04-24 DIAGNOSIS — Z79899 Other long term (current) drug therapy: Secondary | ICD-10-CM | POA: Insufficient documentation

## 2018-04-24 DIAGNOSIS — Z9889 Other specified postprocedural states: Secondary | ICD-10-CM

## 2018-04-24 DIAGNOSIS — Z8719 Personal history of other diseases of the digestive system: Secondary | ICD-10-CM

## 2018-04-24 DIAGNOSIS — K4020 Bilateral inguinal hernia, without obstruction or gangrene, not specified as recurrent: Secondary | ICD-10-CM | POA: Diagnosis present

## 2018-04-24 DIAGNOSIS — Z880 Allergy status to penicillin: Secondary | ICD-10-CM | POA: Diagnosis not present

## 2018-04-24 DIAGNOSIS — K4091 Unilateral inguinal hernia, without obstruction or gangrene, recurrent: Secondary | ICD-10-CM | POA: Diagnosis not present

## 2018-04-24 DIAGNOSIS — K219 Gastro-esophageal reflux disease without esophagitis: Secondary | ICD-10-CM | POA: Diagnosis not present

## 2018-04-24 DIAGNOSIS — E785 Hyperlipidemia, unspecified: Secondary | ICD-10-CM | POA: Diagnosis not present

## 2018-04-24 DIAGNOSIS — Z881 Allergy status to other antibiotic agents status: Secondary | ICD-10-CM | POA: Diagnosis not present

## 2018-04-24 DIAGNOSIS — Z882 Allergy status to sulfonamides status: Secondary | ICD-10-CM | POA: Diagnosis not present

## 2018-04-24 HISTORY — PX: INGUINAL HERNIA REPAIR: SHX194

## 2018-04-24 SURGERY — REPAIR, HERNIA, INGUINAL, LAPAROSCOPIC
Anesthesia: General | Laterality: Right

## 2018-04-24 MED ORDER — KETAMINE HCL 10 MG/ML IJ SOLN
INTRAMUSCULAR | Status: DC | PRN
  Administered 2018-04-24: 20 mg via INTRAVENOUS

## 2018-04-24 MED ORDER — PROPOFOL 10 MG/ML IV BOLUS
INTRAVENOUS | Status: AC
  Filled 2018-04-24: qty 20

## 2018-04-24 MED ORDER — OXYCODONE HCL 5 MG/5ML PO SOLN
5.0000 mg | Freq: Once | ORAL | Status: DC | PRN
  Filled 2018-04-24: qty 5

## 2018-04-24 MED ORDER — KETAMINE HCL 10 MG/ML IJ SOLN
INTRAMUSCULAR | Status: AC
  Filled 2018-04-24: qty 1

## 2018-04-24 MED ORDER — CHLORHEXIDINE GLUCONATE CLOTH 2 % EX PADS: 6.0000 | MEDICATED_PAD | Freq: Once | CUTANEOUS | Status: DC

## 2018-04-24 MED ORDER — DIPHENHYDRAMINE HCL 50 MG/ML IJ SOLN: 12.5000 mg | Freq: Four times a day (QID) | INTRAMUSCULAR | Status: DC | PRN

## 2018-04-24 MED ORDER — LIDOCAINE 2% (20 MG/ML) 5 ML SYRINGE
INTRAMUSCULAR | Status: DC | PRN
  Administered 2018-04-24: 1.5 mg/kg/h via INTRAVENOUS

## 2018-04-24 MED ORDER — SUGAMMADEX SODIUM 200 MG/2ML IV SOLN
INTRAVENOUS | Status: DC | PRN
  Administered 2018-04-24: 150 mg via INTRAVENOUS

## 2018-04-24 MED ORDER — PROPOFOL 10 MG/ML IV BOLUS
INTRAVENOUS | Status: DC | PRN
  Administered 2018-04-24: 100 mg via INTRAVENOUS

## 2018-04-24 MED ORDER — ROCURONIUM BROMIDE 10 MG/ML (PF) SYRINGE
PREFILLED_SYRINGE | INTRAVENOUS | Status: DC | PRN
  Administered 2018-04-24: 10 mg via INTRAVENOUS
  Administered 2018-04-24: 50 mg via INTRAVENOUS
  Administered 2018-04-24: 20 mg via INTRAVENOUS

## 2018-04-24 MED ORDER — ONDANSETRON 4 MG PO TBDP: 4.0000 mg | ORAL_TABLET | Freq: Four times a day (QID) | ORAL | Status: DC | PRN

## 2018-04-24 MED ORDER — ACETAMINOPHEN 500 MG PO TABS
1000.0000 mg | ORAL_TABLET | ORAL | Status: AC
  Administered 2018-04-24: 1000 mg via ORAL
  Filled 2018-04-24: qty 2

## 2018-04-24 MED ORDER — ROCURONIUM BROMIDE 10 MG/ML (PF) SYRINGE
PREFILLED_SYRINGE | INTRAVENOUS | Status: AC
  Filled 2018-04-24: qty 10

## 2018-04-24 MED ORDER — LIDOCAINE 2% (20 MG/ML) 5 ML SYRINGE
INTRAMUSCULAR | Status: AC
  Filled 2018-04-24: qty 5

## 2018-04-24 MED ORDER — POTASSIUM CHLORIDE IN NACL 20-0.9 MEQ/L-% IV SOLN
INTRAVENOUS | Status: DC
  Administered 2018-04-24: 14:00:00 via INTRAVENOUS
  Filled 2018-04-24: qty 1000

## 2018-04-24 MED ORDER — TRAMADOL HCL 50 MG PO TABS: 50.0000 mg | ORAL_TABLET | Freq: Four times a day (QID) | ORAL | Status: DC | PRN

## 2018-04-24 MED ORDER — LIDOCAINE 2% (20 MG/ML) 5 ML SYRINGE
INTRAMUSCULAR | Status: DC | PRN
  Administered 2018-04-24: 50 mg via INTRAVENOUS

## 2018-04-24 MED ORDER — DIPHENHYDRAMINE HCL 12.5 MG/5ML PO ELIX: 12.5000 mg | ORAL_SOLUTION | Freq: Four times a day (QID) | ORAL | Status: DC | PRN

## 2018-04-24 MED ORDER — BUPIVACAINE LIPOSOME 1.3 % IJ SUSP
INTRAMUSCULAR | Status: DC | PRN
  Administered 2018-04-24: 20 mL

## 2018-04-24 MED ORDER — ENOXAPARIN SODIUM 40 MG/0.4ML ~~LOC~~ SOLN
40.0000 mg | SUBCUTANEOUS | Status: DC
  Administered 2018-04-25: 40 mg via SUBCUTANEOUS
  Filled 2018-04-24: qty 0.4

## 2018-04-24 MED ORDER — FENTANYL CITRATE (PF) 250 MCG/5ML IJ SOLN
INTRAMUSCULAR | Status: AC
  Filled 2018-04-24: qty 5

## 2018-04-24 MED ORDER — KETOROLAC TROMETHAMINE 30 MG/ML IJ SOLN: 15.0000 mg | Freq: Once | INTRAMUSCULAR | Status: DC | PRN

## 2018-04-24 MED ORDER — LATANOPROST 0.005 % OP SOLN
1.0000 [drp] | Freq: Every day | OPHTHALMIC | Status: DC
  Administered 2018-04-24: 1 [drp] via OPHTHALMIC
  Filled 2018-04-24: qty 2.5

## 2018-04-24 MED ORDER — MORPHINE SULFATE (PF) 2 MG/ML IV SOLN: 1.0000 mg | INTRAVENOUS | Status: DC | PRN

## 2018-04-24 MED ORDER — PROMETHAZINE HCL 25 MG/ML IJ SOLN: 6.2500 mg | INTRAMUSCULAR | Status: DC | PRN

## 2018-04-24 MED ORDER — GABAPENTIN 100 MG PO CAPS
100.0000 mg | ORAL_CAPSULE | Freq: Two times a day (BID) | ORAL | Status: DC
  Administered 2018-04-24 – 2018-04-25 (×2): 100 mg via ORAL
  Filled 2018-04-24 (×2): qty 1

## 2018-04-24 MED ORDER — VANCOMYCIN HCL IN DEXTROSE 1-5 GM/200ML-% IV SOLN
1000.0000 mg | INTRAVENOUS | Status: DC
  Filled 2018-04-24: qty 200

## 2018-04-24 MED ORDER — LIDOCAINE HCL 2 % IJ SOLN
INTRAMUSCULAR | Status: AC
  Filled 2018-04-24: qty 20

## 2018-04-24 MED ORDER — BUPIVACAINE-EPINEPHRINE 0.25% -1:200000 IJ SOLN
INTRAMUSCULAR | Status: DC | PRN
  Administered 2018-04-24: 30 mL

## 2018-04-24 MED ORDER — DOCUSATE SODIUM 100 MG PO CAPS
100.0000 mg | ORAL_CAPSULE | Freq: Two times a day (BID) | ORAL | Status: DC
  Administered 2018-04-24 – 2018-04-25 (×2): 100 mg via ORAL
  Filled 2018-04-24 (×2): qty 1

## 2018-04-24 MED ORDER — BUPIVACAINE-EPINEPHRINE (PF) 0.25% -1:200000 IJ SOLN
INTRAMUSCULAR | Status: AC
  Filled 2018-04-24: qty 30

## 2018-04-24 MED ORDER — ONDANSETRON HCL 4 MG/2ML IJ SOLN
INTRAMUSCULAR | Status: AC
  Filled 2018-04-24: qty 2

## 2018-04-24 MED ORDER — GABAPENTIN 100 MG PO CAPS
100.0000 mg | ORAL_CAPSULE | ORAL | Status: AC
  Administered 2018-04-24: 100 mg via ORAL
  Filled 2018-04-24: qty 1

## 2018-04-24 MED ORDER — OXYCODONE HCL 5 MG PO TABS: 5.0000 mg | ORAL_TABLET | Freq: Once | ORAL | Status: DC | PRN

## 2018-04-24 MED ORDER — EPHEDRINE SULFATE 50 MG/ML IJ SOLN
INTRAMUSCULAR | Status: DC | PRN
  Administered 2018-04-24 (×2): 10 mg via INTRAVENOUS

## 2018-04-24 MED ORDER — SIMETHICONE 80 MG PO CHEW: 40.0000 mg | CHEWABLE_TABLET | Freq: Four times a day (QID) | ORAL | Status: DC | PRN

## 2018-04-24 MED ORDER — METOPROLOL SUCCINATE ER 25 MG PO TB24
25.0000 mg | ORAL_TABLET | Freq: Every day | ORAL | Status: DC
  Administered 2018-04-25: 25 mg via ORAL
  Filled 2018-04-24: qty 1

## 2018-04-24 MED ORDER — LACTATED RINGERS IV SOLN
INTRAVENOUS | Status: DC
  Administered 2018-04-24 (×3): via INTRAVENOUS

## 2018-04-24 MED ORDER — DEXAMETHASONE SODIUM PHOSPHATE 10 MG/ML IJ SOLN
INTRAMUSCULAR | Status: AC
  Filled 2018-04-24: qty 1

## 2018-04-24 MED ORDER — ACETAMINOPHEN 500 MG PO TABS
1000.0000 mg | ORAL_TABLET | Freq: Four times a day (QID) | ORAL | Status: DC
  Administered 2018-04-24 – 2018-04-25 (×3): 1000 mg via ORAL
  Filled 2018-04-24 (×3): qty 2

## 2018-04-24 MED ORDER — DEXAMETHASONE SODIUM PHOSPHATE 10 MG/ML IJ SOLN
INTRAMUSCULAR | Status: DC | PRN
  Administered 2018-04-24: 4 mg via INTRAVENOUS

## 2018-04-24 MED ORDER — PANTOPRAZOLE SODIUM 40 MG PO TBEC
40.0000 mg | DELAYED_RELEASE_TABLET | Freq: Every day | ORAL | Status: DC
  Administered 2018-04-24 – 2018-04-25 (×2): 40 mg via ORAL
  Filled 2018-04-24 (×2): qty 1

## 2018-04-24 MED ORDER — FENTANYL CITRATE (PF) 100 MCG/2ML IJ SOLN
INTRAMUSCULAR | Status: DC | PRN
  Administered 2018-04-24: 100 ug via INTRAVENOUS
  Administered 2018-04-24 (×3): 50 ug via INTRAVENOUS

## 2018-04-24 MED ORDER — ONDANSETRON HCL 4 MG/2ML IJ SOLN: 4.0000 mg | Freq: Four times a day (QID) | INTRAMUSCULAR | Status: DC | PRN

## 2018-04-24 MED ORDER — EPHEDRINE 5 MG/ML INJ
INTRAVENOUS | Status: AC
  Filled 2018-04-24: qty 10

## 2018-04-24 MED ORDER — FENTANYL CITRATE (PF) 100 MCG/2ML IJ SOLN: 25.0000 ug | INTRAMUSCULAR | Status: DC | PRN

## 2018-04-24 MED ORDER — ONDANSETRON HCL 4 MG/2ML IJ SOLN
INTRAMUSCULAR | Status: DC | PRN
  Administered 2018-04-24: 4 mg via INTRAVENOUS

## 2018-04-24 SURGICAL SUPPLY — 46 items
APPLIER CLIP 5 13 M/L LIGAMAX5 (MISCELLANEOUS)
BANDAGE ADH SHEER 1  50/CT (GAUZE/BANDAGES/DRESSINGS) IMPLANT
BENZOIN TINCTURE PRP APPL 2/3 (GAUZE/BANDAGES/DRESSINGS) IMPLANT
CABLE HIGH FREQUENCY MONO STRZ (ELECTRODE) ×3 IMPLANT
CHLORAPREP W/TINT 26ML (MISCELLANEOUS) ×3 IMPLANT
CLIP APPLIE 5 13 M/L LIGAMAX5 (MISCELLANEOUS) IMPLANT
CLOSURE WOUND 1/2 X4 (GAUZE/BANDAGES/DRESSINGS)
COVER SURGICAL LIGHT HANDLE (MISCELLANEOUS) ×3 IMPLANT
COVER WAND RF STERILE (DRAPES) IMPLANT
DECANTER SPIKE VIAL GLASS SM (MISCELLANEOUS) IMPLANT
DERMABOND ADVANCED (GAUZE/BANDAGES/DRESSINGS) ×2
DERMABOND ADVANCED .7 DNX12 (GAUZE/BANDAGES/DRESSINGS) ×1 IMPLANT
DEVICE SECURE STRAP 25 ABSORB (INSTRUMENTS) IMPLANT
DISSECTOR ROUND CHERRY 3/8 STR (MISCELLANEOUS) ×3 IMPLANT
DRSG TEGADERM 2-3/8X2-3/4 SM (GAUZE/BANDAGES/DRESSINGS) IMPLANT
DRSG TEGADERM 4X4.75 (GAUZE/BANDAGES/DRESSINGS) IMPLANT
ELECT PENCIL ROCKER SW 15FT (MISCELLANEOUS) ×3 IMPLANT
ELECT REM PT RETURN 15FT ADLT (MISCELLANEOUS) ×3 IMPLANT
GAUZE SPONGE 2X2 8PLY STRL LF (GAUZE/BANDAGES/DRESSINGS) IMPLANT
GLOVE BIO SURGEON STRL SZ7.5 (GLOVE) ×18 IMPLANT
GLOVE INDICATOR 8.0 STRL GRN (GLOVE) ×3 IMPLANT
GOWN STRL REUS W/TWL XL LVL3 (GOWN DISPOSABLE) ×9 IMPLANT
KIT BASIN OR (CUSTOM PROCEDURE TRAY) ×3 IMPLANT
L-HOOK LAP DISP 36CM (ELECTROSURGICAL)
LHOOK LAP DISP 36CM (ELECTROSURGICAL) IMPLANT
MESH ULTRAPRO 3X6 7.6X15CM (Mesh General) ×3 IMPLANT
SCISSORS LAP 5X35 DISP (ENDOMECHANICALS) ×3 IMPLANT
SET IRRIG TUBING LAPAROSCOPIC (IRRIGATION / IRRIGATOR) IMPLANT
SLEEVE XCEL OPT CAN 5 100 (ENDOMECHANICALS) ×3 IMPLANT
SPONGE GAUZE 2X2 STER 10/PKG (GAUZE/BANDAGES/DRESSINGS)
STRIP CLOSURE SKIN 1/2X4 (GAUZE/BANDAGES/DRESSINGS) IMPLANT
SUT MNCRL AB 4-0 PS2 18 (SUTURE) ×3 IMPLANT
SUT NOVA NAB DX-16 0-1 5-0 T12 (SUTURE) IMPLANT
SUT NOVA NAB GS-21 0 18 T12 DT (SUTURE) IMPLANT
SUT PROLENE 2 0 SH DA (SUTURE) ×12 IMPLANT
SUT VIC AB 2-0 SH 27 (SUTURE) ×2
SUT VIC AB 2-0 SH 27X BRD (SUTURE) ×1 IMPLANT
SUT VIC AB 3-0 SH 18 (SUTURE) ×3 IMPLANT
TOWEL OR 17X26 10 PK STRL BLUE (TOWEL DISPOSABLE) ×3 IMPLANT
TOWEL OR NON WOVEN STRL DISP B (DISPOSABLE) IMPLANT
TRAY LAPAROSCOPIC (CUSTOM PROCEDURE TRAY) ×3 IMPLANT
TROCAR BLADELESS OPT 5 100 (ENDOMECHANICALS) ×3 IMPLANT
TROCAR XCEL BLUNT TIP 100MML (ENDOMECHANICALS) ×3 IMPLANT
TUBING INSUF HEATED (TUBING) IMPLANT
YANKAUER SUCT BULB TIP 10FT TU (MISCELLANEOUS) ×3 IMPLANT
YANKAUER SUCT BULB TIP NO VENT (SUCTIONS) ×3 IMPLANT

## 2018-04-24 NOTE — Anesthesia Preprocedure Evaluation (Signed)
Anesthesia Evaluation  Patient identified by MRN, date of birth, ID band Patient awake    Reviewed: Allergy & Precautions, NPO status , Patient's Chart, lab work & pertinent test results  Airway Mallampati: II  TM Distance: >3 FB Neck ROM: Full    Dental no notable dental hx.    Pulmonary neg pulmonary ROS,    Pulmonary exam normal breath sounds clear to auscultation       Cardiovascular hypertension, Normal cardiovascular exam Rhythm:Regular Rate:Normal     Neuro/Psych TIAnegative psych ROS   GI/Hepatic negative GI ROS, Neg liver ROS,   Endo/Other  Hypothyroidism   Renal/GU negative Renal ROS  negative genitourinary   Musculoskeletal negative musculoskeletal ROS (+)   Abdominal   Peds negative pediatric ROS (+)  Hematology negative hematology ROS (+)   Anesthesia Other Findings   Reproductive/Obstetrics negative OB ROS                             Anesthesia Physical Anesthesia Plan  ASA: III  Anesthesia Plan: General   Post-op Pain Management:    Induction: Intravenous  PONV Risk Score and Plan: 3 and Ondansetron, Dexamethasone and Treatment may vary due to age or medical condition  Airway Management Planned: Oral ETT  Additional Equipment:   Intra-op Plan:   Post-operative Plan: Extubation in OR  Informed Consent: I have reviewed the patients History and Physical, chart, labs and discussed the procedure including the risks, benefits and alternatives for the proposed anesthesia with the patient or authorized representative who has indicated his/her understanding and acceptance.   Dental advisory given  Plan Discussed with: CRNA and Surgeon  Anesthesia Plan Comments:         Anesthesia Quick Evaluation

## 2018-04-24 NOTE — Anesthesia Postprocedure Evaluation (Signed)
Anesthesia Post Note  Patient: Laurie  Procedure(s) Performed: DIAGNOSTIC LAPAROSCOPIC, OPENREPAIR OF RECURRENT RIGHT INGUINAL HERNIA WITH MESH ERAS PATHWAY (Right )     Patient location during evaluation: PACU Anesthesia Type: General Level of consciousness: awake and alert Pain management: pain level controlled Vital Signs Assessment: post-procedure vital signs reviewed and stable Respiratory status: spontaneous breathing, nonlabored ventilation, respiratory function stable and patient connected to nasal cannula oxygen Cardiovascular status: blood pressure returned to baseline and stable Postop Assessment: no apparent nausea or vomiting Anesthetic complications: no    Last Vitals:  Vitals:   04/24/18 1145 04/24/18 1200  BP: (!) 134/57 (!) 136/56  Pulse: 61 (!) 59  Resp: 15 15  Temp:  36.4 C  SpO2: 98% 98%    Last Pain:  Vitals:   04/24/18 1200  TempSrc:   PainSc: 0-No pain                 Gregory Barrick S

## 2018-04-24 NOTE — Transfer of Care (Signed)
Immediate Anesthesia Transfer of Care Note  Patient: [name]  Procedure(s) Performed: DIAGNOSTIC LAPAROSCOPIC, OPENREPAIR OF RECURRENT RIGHT INGUINAL HERNIA WITH MESH ERAS PATHWAY (Right )  Patient Location: PACU  Anesthesia Type:General  Level of Consciousness: awake, alert , oriented and patient cooperative  Airway & Oxygen Therapy: Patient Spontanous Breathing and Patient connected to face mask oxygen  Post-op Assessment: Report given to RN, Post -op Vital signs reviewed and stable and Patient moving all extremities X 4  Post vital signs: stable  Last Vitals:  Vitals Value Taken Time  BP 143/69 04/24/2018 11:00 AM  Temp    Pulse 66 04/24/2018 11:06 AM  Resp 14 04/24/2018 11:05 AM  SpO2 100 % 04/24/2018 11:06 AM  Vitals shown include unvalidated device data.  Last Pain:  Vitals:   04/24/18 0726  TempSrc:   PainSc: 0-No pain         Complications: No apparent anesthesia complications

## 2018-04-24 NOTE — Anesthesia Procedure Notes (Signed)
Procedure Name: Intubation Date/Time: 04/24/2018 8:34 AM Performed by: Lissa Morales, CRNA Pre-anesthesia Checklist: Patient identified, Emergency Drugs available, Suction available and Patient being monitored Patient Re-evaluated:Patient Re-evaluated prior to induction Oxygen Delivery Method: Circle system utilized Preoxygenation: Pre-oxygenation with 100% oxygen Induction Type: IV induction Ventilation: Mask ventilation without difficulty Laryngoscope Size: Mac and 3 Grade View: Grade II Tube type: Oral Number of attempts: 2 (pt had a steep upper palate, changed from [facility] to MAC3to visulize.  Slight anterior larynx) Airway Equipment and Method: Stylet and Oral airway Placement Confirmation: ETT inserted through vocal cords under direct vision,  positive ETCO2 and breath sounds checked- equal and bilateral Secured at: 21 cm Tube secured with: Tape Dental Injury: Teeth and Oropharynx as per pre-operative assessment

## 2018-04-24 NOTE — Interval H&P Note (Signed)
History and Physical Interval Note:  04/24/2018 8:02 AM  [place]  has presented today for surgery, with the diagnosis of recurrent right inguinal hernia  The various methods of treatment have been discussed with the patient and family. After consideration of risks, benefits and other options for treatment, the patient has consented to  Procedure(s): Gu-Win (Right) as a surgical intervention .  The patient's history has been reviewed, patient examined, no change in status, stable for surgery.  I have reviewed the patient's chart and labs.  Questions were answered to the patient's satisfaction.    Leighton Ruff. Redmond Pulling, MD, FACS General, Bariatric, & Minimally Invasive Surgery [facility]  Greer Pickerel

## 2018-04-24 NOTE — Op Note (Signed)
NAME: [name], KAGEL MEDICAL RECORD IN:[id number] ACCOUNT [id number] DATE OF BIRTH:11-Jul-1928 FACILITY: [facility] LOCATION: [id number] PHYSICIAN:Shuronda Santino Ronnie Derby, MD  OPERATIVE REPORT  DATE OF PROCEDURE:  04/24/2018  PREOPERATIVE DIAGNOSIS:  Right recurrent inguinal hernia.  POSTOPERATIVE DIAGNOSIS:  Recurrent right direct inguinal hernia.  PROCEDURES:  1.  Diagnostic laparoscopy with lysis of adhesions. 2.  Open repair of recurrent right inguinal hernia with mesh.  SURGEON:  Leighton Ruff. Redmond Pulling, MD  ANESTHESIA:  General.  ESTIMATED BLOOD LOSS:  50 mL.  BLOOD ADMINISTERED:  None.   LOCAL:  Exparel, Marcaine mixture as a bilateral laparoscopic TAP block.  SPECIMENS:  None.  FINDINGS:  The patient had intra-abdominal adhesions in her right lower quadrant from her previous open appendectomy, which was expected.  However, the mesh from the open repair done in the right groin was intimately associated with the peritoneum in the  right lower quadrant.  Therefore, I felt that the best option was to proceed with an open repair since the peritoneum would not be able to be taken down without essentially not being able to reconstruct it.  She had a direct hernia.  It was medial to the inferior  epigastric vessels.  INDICATIONS:  The patient is a very pleasant 84 year old female who presented back in the winter with bilateral inguinal hernias.  I took her to the OR for laparoscopic repair of bilateral hernias.  During surgery, she had adhesions in the right lower  quadrant to the peritoneum, so I converted to an open procedure.  She had an indirect inguinal hernia on the right.  It was repaired in standard open tension free Magazine features editor.  She presented back to the clinic a month or 2 ago with recurrent  swelling in the right groin.  Her exam was consistent with a recurrent right inguinal hernia.  She was symptomatic and desired surgical repair.  I recommended a laparoscopic approach, even  though she had some adhesions in her right lower quadrant.  I  felt that we could take them down and repair her hernia internally.  We did discuss the possibility of needing to convert to open.  We received cardiac clearance.  DESCRIPTION OF PROCEDURE:  After obtaining informed consent, the patient was identified in the holding area and the right side was marked.  She received oral Tylenol and gabapentin as part of ERAS protocol.  She was taken to OR 4 and placed supine on the  operating room table.  General endotracheal anesthesia was established.  Sequential compression devices were placed.  A Foley catheter was placed.  Her arms were tucked at her side with the appropriate padding.  She received IV antibiotic prior to skin  incision.  A surgical timeout was performed.  I infiltrated some local at the base of her umbilicus and incised her skin just below the umbilicus through her old previous infraumbilical incision.  The fascia was encountered and incised and the abdominal cavity was entered.  A pursestring suture was  placed around the fascial edges with 0 Vicryl.  A 12 mm Hasson trocar was placed and pneumoperitoneum was smoothly established up to a patient pressure of 15 mmHg without any change in vital signs.  The laparoscope was advanced and the abdominal cavity  was surveilled.  There was now some adhesions in the left lower quadrant, but I was able to inspect the repair.  The mesh was visible behind the peritoneum on the left side.  There is no evidence of a left hernia recurrence.  In the right lower abdomen,    There were adhesions of the cecum to the anterior abdominal wall in the right lower quadrant.  I was able to move the laparoscope around it and visualize the right lower quadrant and groin area.  There was a direct hernia recurrence.  The defect was  medial to the inferior epigastric vessel.  There was an obvious bulge externally that you could see once her abdomen had been insufflated.   Unfortunately, we could visualize the mesh from the open repair out a little bit laterally.  It was intimately  fused with the peritoneum.  I decided to attempt to see if I could develop a plane but first I needed to take down the cecal attachments to the right lower quadrant.  Two 5 mm trocars were placed, one on each side of the abdominal wall in the  midclavicular line under direct visualization.  Then, using sharp scissors without electrocautery, I took down the thin filmy adhesions of the cecum and to the anterior abdominal wall.  There was not an obvious plane to the abdominal wall.  This freed  the cecum from the anterior abdominal wall.  I then reinspected the right groin and lower quadrant.  I had incised the peritoneum a little bit in taking down the cecum.  This was about a centimeter or 2 above the lateral aspect of the previously placed  mesh.  Unfortunately, the peritoneum was just densely adhered to the mesh and I did not feel that I could get the peritoneum off the mesh without essentially not being able to reconstruct it which would leave mesh exposed to the abdominal viscera so  therefore, I decided to convert to an open procedure.    I incised her old right lower quadrant inguinal incision with a 15 blade.  Subcutaneous tissue was divided with electrocautery.  I came across Scarpa's fascia and divided it.  Then, I got down to what appeared to be external oblique aponeurosis.  It was  a thickened from prior repair.  I incised that with a pair of Metzenbaum scissors.  I put hemostats on each side.  I identified the lateral aspect of the previously placed Ethicon Ultrapro mesh, sutured medially to the conjoined tendon, but other than  that, I could not really see any overt mesh that had been chronically scarred in.  I reinsufflated the abdomen and confirmed the direct hernia.  I was able to identify the shelving edge of the inguinal ligament.  At this point, I placed 4 interrupted 2-0   Prolene sutures between the most inferior aspect of the shelving edge of the inguinal ligament and reapproximated to the conjoined tendon and tied that down.  I reinsufflated the abdomen and there was no longer a bulge.  The direct defect had been  reduced and closed.  I confirmed the correction.  We then released pneumoperitoneum.  I then obtained a piece of Ethicon Ultrapro mesh, 3 inches x 6 inches, and trimmed it.  I then sutured it to the periosteum to the pubic bone as an anchoring suture.   The inferior lateral edge was then sewn to the shelving edge of the inguinal ligament in an interrupted fashion with 2-0 Prolene sutures.  I then sewed the superior medial aspect of the mesh in an interrupted fashion to the conjoined tendon.  We then  returned laparoscopically.  The direct defect had been closed and reduced.  There was no longer a bulge in the right groin region.  I then infiltrated Exparel, Marcaine mixture in a bilateral laparoscopic TAP fashion as well as infiltrated in the right  groin region as well.  I then removed the Hasson trocar and tied down the previously placed pursestring suture at the umbilical fascia.  There was no fascial defect.  There is no air leak.  Some Exparel, Marcaine mixture was infiltrated at that location.   Pneumoperitoneum was released and the trocars were removed.  I then reapproximated the external oblique aponeurosis with a running 2-0 Vicryl.  Scarpa's fascia was then closed with inverted interrupted 3-0 Vicryl sutures.  All skin incisions were then  closed with a 4-0 Monocryl in a subcuticular fashion followed by the application of Dermabond.  It should be noted that I did ligate the ilioinguinal nerve it would have been compromised and tied within the reapproximation of the inguinal floor and  trapped by the Prolene sutures.  Therefore, I ended up I decided to ligate it.  All needle, instrument and sponge counts were correct x2.  There were no immediate  complications.     The patient tolerated the procedure well.  She was taken to the recovery room in stable condition.  AN/NUANCE  D:04/24/2018 T:04/24/2018 JOB:[id number]

## 2018-04-24 NOTE — Brief Op Note (Signed)
04/24/2018  10:54 AM  PATIENT:  [name]  84 y.o. female  PRE-OPERATIVE DIAGNOSIS:  recurrent right inguinal hernia; h/o open repair of right indirect inguinal hernia with mesh  POST-OPERATIVE DIAGNOSIS:  recurrent right direct hernia  PROCEDURE:  Procedure(s): DIAGNOSTIC LAPAROSCOPIC, OPENREPAIR OF RECURRENT RIGHT DIRECT INGUINAL HERNIA WITH MESH ERAS PATHWAY (Right)  SURGEON:  Surgeon(s) and Role:    Greer Pickerel, MD - Primary  PHYSICIAN ASSISTANT:   ASSISTANTS: none   ANESTHESIA:   general  EBL: 25 mL   BLOOD ADMINISTERED:none  DRAINS: none   LOCAL MEDICATIONS USED:  OTHER exparel/marcaine mix  SPECIMEN:  No Specimen  DISPOSITION OF SPECIMEN:  N/A  COUNTS:  YES  TOURNIQUET:  * No tourniquets in log *  DICTATION: .Other Dictation: Dictation Number [id number]  PLAN OF CARE: Admit for overnight observation  PATIENT DISPOSITION:  PACU - hemodynamically stable.   Delay start of Pharmacological VTE agent (>24hrs) due to surgical blood loss or risk of bleeding: no

## 2018-04-25 DIAGNOSIS — K219 Gastro-esophageal reflux disease without esophagitis: Secondary | ICD-10-CM | POA: Diagnosis not present

## 2018-04-25 DIAGNOSIS — Z79899 Other long term (current) drug therapy: Secondary | ICD-10-CM | POA: Diagnosis not present

## 2018-04-25 DIAGNOSIS — Z7902 Long term (current) use of antithrombotics/antiplatelets: Secondary | ICD-10-CM | POA: Diagnosis not present

## 2018-04-25 DIAGNOSIS — Z8673 Personal history of transient ischemic attack (TIA), and cerebral infarction without residual deficits: Secondary | ICD-10-CM | POA: Diagnosis not present

## 2018-04-25 DIAGNOSIS — K4091 Unilateral inguinal hernia, without obstruction or gangrene, recurrent: Secondary | ICD-10-CM | POA: Diagnosis not present

## 2018-04-25 DIAGNOSIS — I10 Essential (primary) hypertension: Secondary | ICD-10-CM | POA: Diagnosis not present

## 2018-04-25 MED ORDER — TRAMADOL HCL 50 MG PO TABS: 50.0000 mg | ORAL_TABLET | Freq: Four times a day (QID) | ORAL | 1 refills | Status: DC | PRN

## 2018-04-25 NOTE — Discharge Summary (Signed)
Physician Discharge Summary  [name] TDV:[id number] DOB: Apr 09, 1929 DOA: 04/24/2018  PCP: Gayland Curry, DO  Admit date: 04/24/2018 Discharge date: 04/25/2018  Recommendations for Outpatient Follow-up:    Follow-up Information    Greer Pickerel, MD. Schedule an appointment as soon as possible for a visit in 3 week(s).   Specialty:  General Surgery Contact information: [address] [place] [address] [phone]          Discharge Diagnoses:  1. Recurrent right direct inguinal hernia 2. hypertension  Surgical Procedure: diagnostic laparoscopy, open repair of recurrent direct inguinal hernia with mesh 12/11  Discharge Condition: good Disposition: home  Diet recommendation: cardiac   Filed Weights   04/24/18 0702  Weight: 59.1 kg    History of present illness: The patient underwent laparoscopic repair of a left indirect inguinal hernia as well as a open repair of a right indirect inguinal hernia back in the spring 2019.  She presented with a recurrent bulge in her right inguinal area.  She was symptomatic.  She desired surgical repair.   Hospital Course:  She was brought in for planned repair.  We are going to try to do it laparoscopic.  We knew that there were intra-abdominal adhesions from her prior open appendectomy.  I was able to take down these adhesions laparoscopically but unfortunately her mesh from her open repair was intentionally associated with the peritoneum.  I felt that proceeding laparoscopically would be quite challenging and the peritoneum would be unable to be reconstructed.  While her previous right hernia was an indirect the new hernia was a direct hernia.  I repaired it open and confirmed the repair laparoscopically.  She was kept overnight for observation.  On postoperative day 1 she was doing well.  She was tolerating a diet.  She was ambulating without difficulty. Her vitals were stable. I discussed dc instructions with  pt. Her pain was well controlled.   Restart plavix on Saturday.   BP (!) 127/52   Pulse (!) 57   Temp 97.8 F (36.6 C) (Oral)   Resp 15   Ht 5' (1.524 m)   Wt 59.1 kg   SpO2 95%   BMI 25.43 kg/m   Gen: alert, NAD, non-toxic appearing Pupils: equal, no scleral icterus Pulm: Lungs clear to auscultation, symmetric chest rise CV: regular rate and rhythm Abd: soft, mild approp tender, nondistended. No hematoma.  No cellulitis. No incisional hernia Ext: no edema, no calf tenderness Skin: no rash, no jaundice   Discharge Instructions  Discharge Instructions    Call MD for:   Complete by:  As directed    Temperature >101   Call MD for:  hives   Complete by:  As directed    Call MD for:  persistant dizziness or light-headedness   Complete by:  As directed    Call MD for:  persistant nausea and vomiting   Complete by:  As directed    Call MD for:  redness, tenderness, or signs of infection (pain, swelling, redness, odor or green/yellow discharge around incision site)   Complete by:  As directed    Call MD for:  severe uncontrolled pain   Complete by:  As directed    Diet - low sodium heart healthy   Complete by:  As directed    Discharge instructions   Complete by:  As directed    See [facility] discharge instructions   Increase activity slowly   Complete by:  As directed      Allergies as of 04/25/2018      Reactions   Trimethoprim Other (See Comments)   Headaches/ ear pressure    Erythromycin Other (See Comments)   UNSPECIFIED REACTION    Macrodantin [nitrofurantoin Macrocrystal] Other (See Comments)   UNSPECIFIED REACTION    Penicillins Other (See Comments)   UNSPECIFIED REACTION  Has patient had a PCN reaction causing immediate rash, facial/tongue/throat swelling, SOB or lightheadedness with hypotension: Unknown Has patient had a PCN reaction causing severe rash involving mucus membranes or skin necrosis: Unknown Has patient had a PCN reaction that required  hospitalization: No Has patient had a PCN reaction occurring within the last 10 years: No If all of the above answers are "NO", then may proceed with Cephalosporin use.   Sulfa Antibiotics Other (See Comments)   UNSPECIFIED REACTION    Latex Itching      Medication List    TAKE these medications   acetaminophen 500 MG tablet Commonly known as:  TYLENOL Take 500 mg by mouth 2 (two) times daily as needed for mild pain or headache.   bacitracin-polymyxin b ointment Commonly known as:  POLYSPORIN Apply 1 application topically daily as needed (for wound care).   bimatoprost 0.01 % Soln Commonly known as:  LUMIGAN Place 1 drop into both eyes at bedtime.   Calcium Carbonate-Vitamin D 600-400 MG-UNIT chew tablet Chew 1 tablet by mouth 2 (two) times daily.   clopidogrel 75 MG tablet Commonly known as:  PLAVIX TAKE 1 TABLET ONCE A DAY TO PREVENT STROKE.   CRANBERRY EXTRACT PO Take 1 tablet by mouth daily.   famotidine 10 MG chewable tablet Commonly known as:  PEPCID AC Chew 10 mg by mouth at bedtime as needed for heartburn.   hydrocortisone cream 1 % Apply 1 application topically daily as needed for itching.   metoprolol succinate 25 MG 24 hr tablet Commonly known as:  TOPROL-XL Take 1 tablet (25 mg total) by mouth daily.   PRESERVISION AREDS 2 PO Take 1 capsule by mouth 2 (two) times daily.   psyllium 58.6 % packet Commonly known as:  METAMUCIL Take 1 packet by mouth daily. Mix with water   SYSTANE ULTRA 0.4-0.3 % Soln Generic drug:  Polyethyl Glycol-Propyl Glycol Place 1 vial into both eyes 2 (two) times daily. May use another dose during the day as needed for dry eye   traMADol 50 MG tablet Commonly known as:  ULTRAM Take 1 tablet (50 mg total) by mouth every 6 (six) hours as needed for severe pain.   vitamin C 500 MG tablet Commonly known as:  ASCORBIC ACID Take 500 mg by mouth daily with supper.      Follow-up Information    Greer Pickerel, MD. Schedule an  appointment as soon as possible for a visit in 3 week(s).   Specialty:  General Surgery Contact information: [place] [address] [phone]            The results of significant diagnostics from this hospitalization (including imaging, microbiology, ancillary and laboratory) are listed below for reference.    Significant Diagnostic Studies: No results found.  Microbiology: No results found for this or any previous visit (from the past 240 hour(s)).   Labs: Basic Metabolic Panel: No results for input(s): NA, K, CL, CO2, GLUCOSE, BUN, CREATININE, CALCIUM, MG, PHOS in the last 168 hours. Liver Function Tests: No results for input(s): AST, ALT, ALKPHOS, BILITOT, PROT, ALBUMIN in the  last 168 hours. No results for input(s): LIPASE, AMYLASE in the last 168 hours. No results for input(s): AMMONIA in the last 168 hours. CBC: No results for input(s): WBC, NEUTROABS, HGB, HCT, MCV, PLT in the last 168 hours. Cardiac Enzymes: No results for input(s): CKTOTAL, CKMB, CKMBINDEX, TROPONINI in the last 168 hours. BNP: BNP (last 3 results) No results for input(s): BNP in the last 8760 hours.  ProBNP (last 3 results) No results for input(s): PROBNP in the last 8760 hours.  CBG: No results for input(s): GLUCAP in the last 168 hours.  Active Problems:   S/P right inguinal hernia repair   Time coordinating discharge: 15 min  Signed:  Gayland Curry, MD [facility], [place] [phone] 04/25/2018, 8:52 AM

## 2018-04-25 NOTE — Discharge Instructions (Signed)
RESUME PLAVIX ON Friday 04/26/2018  [facility] LAPAROSCOPIC SURGERY: POST OP INSTRUCTIONS Always review your discharge instruction sheet given to you by the facility where your surgery was performed. IF YOU HAVE DISABILITY OR FAMILY LEAVE FORMS, YOU MUST BRING THEM TO THE OFFICE FOR PROCESSING.   DO NOT GIVE THEM TO YOUR DOCTOR.  PAIN CONTROL  1. First take acetaminophen (Tylenol) AND/or ibuprofen (Advil) to control your pain after surgery.  Follow directions on package.  Taking acetaminophen (Tylenol) and/or ibuprofen (Advil) regularly after surgery will help to control your pain and lower the amount of prescription pain medication you may need.  You should not take more than 3,000 mg (3 grams) of acetaminophen (Tylenol) in 24 hours.  You should not take ibuprofen (Advil), aleve, motrin, naprosyn or other NSAIDS if you have a history of stomach ulcers or chronic kidney disease.  2. A prescription for pain medication may be given to you upon discharge.  Take your pain medication as prescribed, if you still have uncontrolled pain after taking acetaminophen (Tylenol) or ibuprofen (Advil). 3. Use ice packs to help control pain. 4. If you need a refill on your pain medication, please contact your pharmacy.  They will contact our office to request authorization. Prescriptions will not be filled after 5pm or on week-ends.  HOME MEDICATIONS 5. Take your usually prescribed medications unless otherwise directed.  DIET 6. You should follow a light diet the first few days after arrival home.  Be sure to include lots of fluids daily. Avoid fatty, fried foods.   CONSTIPATION 7. It is common to experience some constipation after surgery and if you are taking pain medication.  Increasing fluid intake and taking a stool softener (such as Colace) will usually help or prevent this problem from occurring.  A mild laxative (Milk of Magnesia or Miralax) should be taken according to package  instructions if there are no bowel movements after 48 hours.  WOUND/INCISION CARE 8. Most patients will experience some swelling and bruising in the area of the incisions.  Ice packs will help.  Swelling and bruising can take several days to resolve.  9. Unless discharge instructions indicate otherwise, follow guidelines below  a. STERI-STRIPS - you may remove your outer bandages 48 hours after surgery, and you may shower at that time.  You have steri-strips (small skin tapes) in place directly over the incision.  These strips should be left on the skin for 7-10 days.   b. DERMABOND/SKIN GLUE - you may shower in 24 hours.  The glue will flake off over the next 2-3 weeks. 10. Any sutures or staples will be removed at the office during your follow-up visit.  ACTIVITIES 11. You may resume regular (light) daily activities beginning the next day--such as daily self-care, walking, climbing stairs--gradually increasing activities as tolerated.  You may have sexual intercourse when it is comfortable.  Refrain from any heavy lifting or straining until approved by your doctor. a. You may drive when you are no longer taking prescription pain medication, you can comfortably wear a seatbelt, and you can safely maneuver your car and apply brakes.  FOLLOW-UP 12. You should see your doctor in the office for a follow-up appointment approximately 2-3 weeks after your surgery.  You should have been given your post-op/follow-up appointment when your surgery was scheduled.  If you did not receive a post-op/follow-up appointment, make sure that you call for this appointment within a day or two after you arrive home to insure  a convenient appointment time.  OTHER INSTRUCTIONS 13. DO NOT LIFT/PUSH/PULL ANYTHING GREATER THAN 10 LBS FOR 6 WEEKS  WHEN TO CALL YOUR DOCTOR: 1. Fever over 101.0 2. Inability to urinate 3. Continued bleeding from incision. 4. Increased pain, redness, or drainage from the  incision. 5. Increasing abdominal pain  The clinic staff is available to answer your questions during regular business hours.  Please dont hesitate to call and ask to speak to one of the nurses for clinical concerns.  If you have a medical emergency, go to the nearest emergency room or call 911.  A surgeon from [facility] is always on call at the hospital. [address], [place], [place], [place]  [address] ? [address], [place], [place]   [phone] ? [phone] ? FAX [phone] Web site: [email or web address]     Managing Your Pain After Surgery Without Opioids    Thank you for participating in our program to help patients manage their pain after surgery without opioids. This is part of our effort to provide you with the best care possible, without exposing you or your family to the risk that opioids pose.  What pain can I expect after surgery? You can expect to have some pain after surgery. This is normal. The pain is typically worse the day after surgery, and quickly begins to get better. Many studies have found that many patients are able to manage their pain after surgery with Over-the-Counter (OTC) medications such as Tylenol and Motrin. If you have a condition that does not allow you to take Tylenol or Motrin, notify your surgical team.  How will I manage my pain? The best strategy for controlling your pain after surgery is around the clock pain control with Tylenol (acetaminophen) and Motrin (ibuprofen or Advil). Alternating these medications with each other allows you to maximize your pain control. In addition to Tylenol and Motrin, you can use heating pads or ice packs on your incisions to help reduce your pain.  How will I alternate your regular strength over-the-counter pain medication? You will take a dose of pain medication every three hours. ; Start by taking 650 mg of Tylenol (2 pills of 325 mg) ; 3 hours later  take 600 mg of Motrin (3 pills of 200 mg) ; 3 hours after taking the Motrin take 650 mg of Tylenol ; 3 hours after that take 600 mg of Motrin.   - 1 -  See example - if your first dose of Tylenol is at 12:00 PM   12:00 PM Tylenol 650 mg (2 pills of 325 mg)  3:00 PM Motrin 600 mg (3 pills of 200 mg)  6:00 PM Tylenol 650 mg (2 pills of 325 mg)  9:00 PM Motrin 600 mg (3 pills of 200 mg)  Continue alternating every 3 hours   We recommend that you follow this schedule around-the-clock for at least 3 days after surgery, or until you feel that it is no longer needed. Use the table on the last page of this handout to keep track of the medications you are taking. Important: Do not take more than 3000mg  of Tylenol or 3200mg  of Motrin in a 24-hour period. Do not take ibuprofen/Motrin if you have a history of bleeding stomach ulcers, severe kidney disease, &/or actively taking a blood thinner  What if I still have pain? If you have pain that is not controlled with the over-the-counter pain medications (Tylenol and Motrin or Advil) you might have what  we call breakthrough pain. You will receive a prescription for a small amount of an opioid pain medication such as Oxycodone, Tramadol, or Tylenol with Codeine. Use these opioid pills in the first 24 hours after surgery if you have breakthrough pain. Do not take more than 1 pill every 4-6 hours.  If you still have uncontrolled pain after using all opioid pills, don't hesitate to call our staff using the number provided. We will help make sure you are managing your pain in the best way possible, and if necessary, we can provide a prescription for additional pain medication.   Day 1    Time  Name of Medication Number of pills taken  Amount of Acetaminophen  Pain Level   Comments  AM PM       AM PM       AM PM       AM PM       AM PM       AM PM       AM PM       AM PM       Total Daily amount of Acetaminophen Do not take more than  3,000  mg per day      Day 2    Time  Name of Medication Number of pills taken  Amount of Acetaminophen  Pain Level   Comments  AM PM       AM PM       AM PM       AM PM       AM PM       AM PM       AM PM       AM PM       Total Daily amount of Acetaminophen Do not take more than  3,000 mg per day      Day 3    Time  Name of Medication Number of pills taken  Amount of Acetaminophen  Pain Level   Comments  AM PM       AM PM       AM PM       AM PM          AM PM       AM PM       AM PM       AM PM       Total Daily amount of Acetaminophen Do not take more than  3,000 mg per day      Day 4    Time  Name of Medication Number of pills taken  Amount of Acetaminophen  Pain Level   Comments  AM PM       AM PM       AM PM       AM PM       AM PM       AM PM       AM PM       AM PM       Total Daily amount of Acetaminophen Do not take more than  3,000 mg per day      Day 5    Time  Name of Medication Number of pills taken  Amount of Acetaminophen  Pain Level   Comments  AM PM       AM PM       AM PM       AM PM  AM PM       AM PM       AM PM       AM PM       Total Daily amount of Acetaminophen Do not take more than  3,000 mg per day       Day 6    Time  Name of Medication Number of pills taken  Amount of Acetaminophen  Pain Level  Comments  AM PM       AM PM       AM PM       AM PM       AM PM       AM PM       AM PM       AM PM       Total Daily amount of Acetaminophen Do not take more than  3,000 mg per day      Day 7    Time  Name of Medication Number of pills taken  Amount of Acetaminophen  Pain Level   Comments  AM PM       AM PM       AM PM       AM PM       AM PM       AM PM       AM PM       AM PM       Total Daily amount of Acetaminophen Do not take more than  3,000 mg per day        For additional information about how and where to safely dispose of unused opioid medications -  [employer].com.br  Disclaimer: This document contains information and/or instructional materials adapted from [place] for the typical patient with your condition. It does not replace medical advice from your health care provider because your experience may differ from that of the typical patient. Talk to your health care provider if you have any questions about this document, your condition or your treatment plan. Adapted from [place]

## 2018-04-25 NOTE — Progress Notes (Signed)
Assessment unchanged. Pt and daughter verbalized understanding of dc instructions through teach back. No scripts at dc. Discharged via wc to front entrance accompanied by daughter and NT.

## 2018-04-26 ENCOUNTER — Encounter (HOSPITAL_COMMUNITY): Payer: Self-pay | Admitting: General Surgery

## 2018-05-01 ENCOUNTER — Encounter: Payer: Self-pay | Admitting: Internal Medicine

## 2018-05-01 DIAGNOSIS — Z1231 Encounter for screening mammogram for malignant neoplasm of breast: Secondary | ICD-10-CM | POA: Diagnosis not present

## 2018-06-27 ENCOUNTER — Ambulatory Visit (INDEPENDENT_AMBULATORY_CARE_PROVIDER_SITE_OTHER): Payer: Medicare Other | Admitting: Internal Medicine

## 2018-06-27 ENCOUNTER — Encounter: Payer: Self-pay | Admitting: Internal Medicine

## 2018-06-27 VITALS — BP 130/78 | HR 63 | Temp 97.6°F | Ht 62.0 in | Wt 128.0 lb

## 2018-06-27 DIAGNOSIS — R29898 Other symptoms and signs involving the musculoskeletal system: Secondary | ICD-10-CM | POA: Diagnosis not present

## 2018-06-27 DIAGNOSIS — E7800 Pure hypercholesterolemia, unspecified: Secondary | ICD-10-CM

## 2018-06-27 DIAGNOSIS — L602 Onychogryphosis: Secondary | ICD-10-CM | POA: Diagnosis not present

## 2018-06-27 DIAGNOSIS — M79661 Pain in right lower leg: Secondary | ICD-10-CM

## 2018-06-27 DIAGNOSIS — I10 Essential (primary) hypertension: Secondary | ICD-10-CM

## 2018-06-27 DIAGNOSIS — M8580 Other specified disorders of bone density and structure, unspecified site: Secondary | ICD-10-CM

## 2018-06-27 DIAGNOSIS — N3281 Overactive bladder: Secondary | ICD-10-CM | POA: Diagnosis not present

## 2018-06-27 MED ORDER — METOPROLOL SUCCINATE ER 25 MG PO TB24: 25.0000 mg | ORAL_TABLET | Freq: Every day | ORAL | 3 refills | Status: DC

## 2018-06-27 NOTE — Progress Notes (Signed)
Location:  [facility] clinic Provider:  Martisha Toulouse L. Mariea Clonts, D.O., C.M.D.  Code Status: DNR Goals of Care:  Advanced Directives 04/24/2018  Does Patient Have a Medical Advance Directive? -  Type of Advance Directive -  Does patient want to make changes to medical advance directive? No - Patient declined  Copy of [place] in Chart? -  Pre-existing out of facility DNR order (yellow form or pink MOST form) -     Chief Complaint  Patient presents with  . Medical Management of Chronic Issues    53mth follow-up    HPI: Patient is a 85 y.o. female seen today for medical management of chronic diseases.  [facility] lives at [facility].    She had her laparoscopic inguinal hernia repair with Dr. Redmond Pulling in Dec.  She has done well.  Has developed minor pain in her right lower quadrant occasionally w/ no good reason for it.  Has done very well.    She saw [name] just before that procedure.  Last cholesterol was early last year and LDL was 85--she's not on a statin at 85 yo.    HTN:  bp well controlled on metoprolol.     Something wrong with big toe of right toe--?fungus, ingrown nail.    She had been working with therapy end of last year and made great progress with strength. 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 Still using cane.  Has some soreness of right lateral shin that started after her fall in Nov where she had a hip contusion.  She says it hurts the most when she squats down but does not sit if uses the public restroom.    She uses the nustep 12 mins and goes to a 45 min stretching class twice a week.  Brought her caltrate with D--600/D3 800 bid.    Past Medical History:  Diagnosis Date  . Cervicalgia   . Chest pain at rest 01/07/2012  . Displacement of cervical intervertebral disc without myelopathy   . Diverticulosis of colon (without mention of hemorrhage)   . Female stress incontinence   . GERD (gastroesophageal reflux disease)    no peds occasional pepcid  .  Inguinal hernia without mention of obstruction or gangrene, unilateral or unspecified, (not specified as recurrent)   . Internal hemorrhoids without mention of complication   . Lumbago   . Macular degeneration (senile) of retina, unspecified   . Osteoarthrosis, unspecified whether generalized or localized, unspecified site   . Other and unspecified hyperlipidemia   . Pain in joint, hand   . Palpitations   . Reflux esophagitis   . Scoliosis (and kyphoscoliosis), idiopathic   . Senile osteoporosis   . Skin disorder   . Thyroid disease   . TIA (transient ischemic attack)   . Unruptured popliteal cyst 06/24/2014   Right knee   . Unspecified essential hypertension   . Unspecified glaucoma(365.9)   . Unspecified hypothyroidism   . Unspecified vitamin D deficiency     Past Surgical History:  Procedure Laterality Date  . ABDOMINAL HYSTERECTOMY  1975   Dr Mallie Mussel  . APPENDECTOMY  1988  . cardiolite myocardial perfusion study    . DOPPLER ECHOCARDIOGRAPHY    . EYE SURGERY Bilateral 2009   cataract removed right eye, Dr Charise Killian  . FOOT SURGERY  august 2013   Doran Durand, MD  . INGUINAL HERNIA REPAIR Bilateral    w/mesh  . INGUINAL HERNIA REPAIR Left 08/03/2017   Procedure: LAPAROSCOPIC LEFT INGUINAL  HERNIA REPAIR WITH MESH;  Surgeon: Greer Pickerel, MD;  Location: [facility];  Service: General;  Laterality: Left;  . INGUINAL HERNIA REPAIR Right 08/03/2017   Procedure: HERNIA REPAIR RIGHT INGUINAL ADULT WITH MESH;  Surgeon: Greer Pickerel, MD;  Location: [facility];  Service: General;  Laterality: Right;  . INGUINAL HERNIA REPAIR     Dr. Redmond Pulling 04-24-18  . INGUINAL HERNIA REPAIR Right 04/24/2018   Procedure: DIAGNOSTIC LAPAROSCOPIC, OPENREPAIR OF RECURRENT RIGHT INGUINAL HERNIA WITH MESH ERAS PATHWAY;  Surgeon: Greer Pickerel, MD;  Location: [facility];  Service: General;  Laterality: Right;  . INSERTION OF MESH Bilateral 08/03/2017   Procedure: INSERTION OF MESH;  Surgeon: Greer Pickerel, MD;  Location: [facility];   Service: General;  Laterality: Bilateral;  . NM MYOVIEW LTD     negative  . TONSILLECTOMY  1937  . TOOTH EXTRACTION  09/16/13   Dr Carlos American    Allergies  Allergen Reactions  . Trimethoprim Other (See Comments)    Headaches/ ear pressure   . Erythromycin Other (See Comments)    UNSPECIFIED REACTION   . Macrodantin [Nitrofurantoin Macrocrystal] Other (See Comments)    UNSPECIFIED REACTION   . Penicillins Other (See Comments)    UNSPECIFIED REACTION  Has patient had a PCN reaction causing immediate rash, facial/tongue/throat swelling, SOB or lightheadedness with hypotension: Unknown Has patient had a PCN reaction causing severe rash involving mucus membranes or skin necrosis: Unknown Has patient had a PCN reaction that required hospitalization: No Has patient had a PCN reaction occurring within the last 10 years: No If all of the above answers are "NO", then may proceed with Cephalosporin use.  . Sulfa Antibiotics Other (See Comments)    UNSPECIFIED REACTION   . Latex Itching    Outpatient Encounter Medications as of 06/27/2018  Medication Sig  . acetaminophen (TYLENOL) 500 MG tablet Take 500 mg by mouth 2 (two) times daily as needed for mild pain or headache.   . bimatoprost (LUMIGAN) 0.01 % SOLN Place 1 drop into both eyes at bedtime.  . Calcium Carbonate-Vitamin D 600-400 MG-UNIT chew tablet Chew 1 tablet by mouth 2 (two) times daily.   . clopidogrel (PLAVIX) 75 MG tablet TAKE 1 TABLET ONCE A DAY TO PREVENT STROKE.  Marland Kitchen CRANBERRY EXTRACT PO Take 1 tablet by mouth daily.  . famotidine (PEPCID AC) 10 MG chewable tablet Chew 10 mg by mouth at bedtime as needed for heartburn.  . hydrocortisone cream 1 % Apply 1 application topically daily as needed for itching.   . metoprolol succinate (TOPROL-XL) 25 MG 24 hr tablet Take 1 tablet (25 mg total) by mouth daily.  . Multiple Vitamins-Minerals (PRESERVISION AREDS 2 PO) Take 1 capsule by mouth 2 (two) times daily.   Vladimir Faster Glycol-Propyl  Glycol (SYSTANE ULTRA) 0.4-0.3 % SOLN Place 1 vial into both eyes 2 (two) times daily. May use another dose during the day as needed for dry eye   . psyllium (METAMUCIL) 58.6 % packet Take 1 packet by mouth daily. Mix with water  . traMADol (ULTRAM) 50 MG tablet Take 1 tablet (50 mg total) by mouth every 6 (six) hours as needed for severe pain.  . vitamin C (ASCORBIC ACID) 500 MG tablet Take 500 mg by mouth daily with supper.   . [DISCONTINUED] bacitracin-polymyxin b (POLYSPORIN) ointment Apply 1 application topically daily as needed (for wound care).    No facility-administered encounter medications on file as of 06/27/2018.     Review of Systems:  Review of  Systems  Constitutional: Negative for chills, fever and malaise/fatigue.  HENT: Positive for hearing loss. Negative for congestion.   Eyes: Negative for blurred vision.       Glasses  Respiratory: Negative for cough and shortness of breath.   Cardiovascular: Positive for leg swelling. Negative for chest pain and palpitations.  Gastrointestinal: Negative for abdominal pain, blood in stool, constipation, diarrhea and melena.       Recovered from right hernia surgery  Genitourinary: Negative for dysuria.  Musculoskeletal: Positive for falls. Negative for joint pain.       Right lateral shin sore when she squats   Skin: Negative for itching and rash.       Plans to get a derm consult for her keratoses of her abdomen  Neurological: Negative for loss of consciousness and headaches.  Endo/Heme/Allergies: Bruises/bleeds easily.  Psychiatric/Behavioral: Negative for depression and memory loss. The patient is not nervous/anxious and does not have insomnia.     Health Maintenance  Topic Date Due  . MAMMOGRAM  05/02/2019  . TETANUS/TDAP  03/20/2020  . INFLUENZA VACCINE  Completed  . DEXA SCAN  Completed  . PNA vac Low Risk Adult  Completed    Physical Exam: Vitals:   06/27/18 1441  BP: 130/78  Pulse: 63  Temp: 97.6 F (36.4 C)    TempSrc: Oral  SpO2: 98%  Weight: 128 lb (58.1 kg)  Height: 5\' 2"  (1.575 m)   Body mass index is 23.41 kg/m. Physical Exam Vitals signs reviewed.  Constitutional:      Appearance: Normal appearance.  HENT:     Head: Normocephalic and atraumatic.  Cardiovascular:     Rate and Rhythm: Normal rate and regular rhythm.     Pulses: Normal pulses.     Heart sounds: Normal heart sounds.  Pulmonary:     Effort: Pulmonary effort is normal.     Breath sounds: Normal breath sounds.  Abdominal:     General: Bowel sounds are normal. There is no distension.     Palpations: There is no mass.     Tenderness: There is no abdominal tenderness. There is no guarding or rebound.  Musculoskeletal:     Comments: Ambulates with cane  Skin:    General: Skin is warm and dry.     Comments: Some seborrheic and actinic keratoses of abdomen noted  Neurological:     General: No focal deficit present.     Mental Status: She is alert and oriented to person, place, and time.  Psychiatric:        Mood and Affect: Mood normal.        Behavior: Behavior normal.        Thought Content: Thought content normal.        Judgment: Judgment normal.     Labs reviewed: Basic Metabolic Panel: Recent Labs    07/26/17 1428 08/03/17 1342 08/04/17 0629 04/15/18 1204  NA 138  --  134* 138  K 4.2  --  4.3 3.8  CL 103  --  99* 101  CO2 25  --  24 30  GLUCOSE 88  --  109* 91  BUN 17  --  12 24*  CREATININE 0.67 0.73 0.70 0.59  CALCIUM 9.0  --  8.5* 9.6   Liver Function Tests: Recent Labs    07/26/17 1428 04/15/18 1204  AST 24 24  ALT 19 21  ALKPHOS 52 59  BILITOT 0.5 0.5  PROT 6.2* 6.9  ALBUMIN 3.7 4.7   No  results for input(s): LIPASE, AMYLASE in the last 8760 hours. No results for input(s): AMMONIA in the last 8760 hours. CBC: Recent Labs    07/26/17 1428 04/15/18 1204  WBC 7.3 8.2  NEUTROABS 4.6 5.0  HGB 12.4 13.1  HCT 37.7 42.6  MCV 94.3 97.0  PLT 203 218   Lipid Panel: No results  for input(s): CHOL, HDL, LDLCALC, TRIG, CHOLHDL, LDLDIRECT in the last 8760 hours. Lab Results  Component Value Date   HGBA1C 5.3 05/02/2013    Procedures since last visit: No results found.  Assessment/Plan 1. Essential hypertension -cont current regimen and monitor - metoprolol succinate (TOPROL-XL) 25 MG 24 hr tablet; Take 1 tablet (25 mg total) by mouth daily.  Dispense: 90 tablet; Refill: 3--refills entered  2. Senile osteopenia -cont ca with D, weightbearing exercise  3. Overactive bladder -recommend scheduled toilet trips, pads   4. Pure hypercholesterolemia -will plan to check lipids next appt  5. Proximal leg weakness - requests to return to PT at facility--order put in - Ambulatory referral to Physical Therapy  6. Thickened nails -of great toenails especially right--a bit painful due to bunion and this, does not appear in grown, may consider going back to podiatry for nail trimming  7. Pain in right shin -seems muscular since her fall with hip contusion -only when she squats or goes from sit to stand Labs/tests ordered:  @ORDERS @ Next appt:  10/18/2018   Tatym Schermer L. Celestial Barnfield, D.O. [facility] [address], [place] [address] Cell Phone (Mon-Fri 8am-5pm):  [phone] On Call:  [phone] & follow prompts after 5pm & weekends Office Phone:  [phone] Office Fax:  [phone]

## 2018-07-01 DIAGNOSIS — M6281 Muscle weakness (generalized): Secondary | ICD-10-CM | POA: Diagnosis not present

## 2018-07-01 DIAGNOSIS — R2681 Unsteadiness on feet: Secondary | ICD-10-CM | POA: Diagnosis not present

## 2018-07-02 DIAGNOSIS — M6281 Muscle weakness (generalized): Secondary | ICD-10-CM | POA: Diagnosis not present

## 2018-07-02 DIAGNOSIS — R2681 Unsteadiness on feet: Secondary | ICD-10-CM | POA: Diagnosis not present

## 2018-07-03 ENCOUNTER — Non-Acute Institutional Stay: Payer: Medicare Other | Admitting: Internal Medicine

## 2018-07-03 ENCOUNTER — Encounter: Payer: Self-pay | Admitting: Internal Medicine

## 2018-07-03 VITALS — BP 122/74 | HR 78 | Temp 97.5°F | Ht 62.0 in | Wt 128.0 lb

## 2018-07-03 DIAGNOSIS — B9789 Other viral agents as the cause of diseases classified elsewhere: Secondary | ICD-10-CM

## 2018-07-03 DIAGNOSIS — J028 Acute pharyngitis due to other specified organisms: Secondary | ICD-10-CM

## 2018-07-03 DIAGNOSIS — B349 Viral infection, unspecified: Secondary | ICD-10-CM

## 2018-07-03 NOTE — Progress Notes (Signed)
Location: [place] of Service:  Clinic (12)  Provider:   Code Status:  Goals of Care:  Advanced Directives 07/03/2018  Does Patient Have a Medical Advance Directive? Yes  Type of Advance Directive Living will;Healthcare Power of [place];Out of facility DNR (pink MOST or yellow form)  Does patient want to make changes to medical advance directive? -  Copy of [place] in Chart? Yes - validated most recent copy scanned in chart (See row information)  Pre-existing out of facility DNR order (yellow form or pink MOST form) Yellow form placed in chart (order not valid for inpatient use)     Chief Complaint  Patient presents with  . Acute Visit    sore throat symptoms for 2 days     HPI: Patient is a 85 y.o. female seen today for an acute visit for c/o Sore throat for last 2 days.  Patient has h/o Hypertension, GERD, Hypothyroidism, Hyperlipidemia and Recent hernia repair  Patient came with c/o Sore throat. Denies any Cough.  No Fever no SOB Some congestion. No Chest pain.  Walks with the [employer]. Still drives  Past Medical History:  Diagnosis Date  . Cervicalgia   . Chest pain at rest 01/07/2012  . Displacement of cervical intervertebral disc without myelopathy   . Diverticulosis of colon (without mention of hemorrhage)   . Female stress incontinence   . GERD (gastroesophageal reflux disease)    no peds occasional pepcid  . Inguinal hernia without mention of obstruction or gangrene, unilateral or unspecified, (not specified as recurrent)   . Internal hemorrhoids without mention of complication   . Lumbago   . Macular degeneration (senile) of retina, unspecified   . Osteoarthrosis, unspecified whether generalized or localized, unspecified site   . Other and unspecified hyperlipidemia   . Pain in joint, hand   . Palpitations   . Reflux esophagitis   . Scoliosis (and kyphoscoliosis), idiopathic   . Senile osteoporosis   . Skin disorder     . Thyroid disease   . TIA (transient ischemic attack)   . Unruptured popliteal cyst 06/24/2014   Right knee   . Unspecified essential hypertension   . Unspecified glaucoma(365.9)   . Unspecified hypothyroidism   . Unspecified vitamin D deficiency     Past Surgical History:  Procedure Laterality Date  . ABDOMINAL HYSTERECTOMY  1975   Dr Mallie Mussel  . APPENDECTOMY  1988  . cardiolite myocardial perfusion study    . DOPPLER ECHOCARDIOGRAPHY    . EYE SURGERY Bilateral 2009   cataract removed right eye, Dr Charise Killian  . FOOT SURGERY  august 2013   Doran Durand, MD  . INGUINAL HERNIA REPAIR Bilateral    w/mesh  . INGUINAL HERNIA REPAIR Left 08/03/2017   Procedure: LAPAROSCOPIC LEFT INGUINAL HERNIA REPAIR WITH MESH;  Surgeon: Greer Pickerel, MD;  Location: [place];  Service: General;  Laterality: Left;  . INGUINAL HERNIA REPAIR Right 08/03/2017   Procedure: HERNIA REPAIR RIGHT INGUINAL ADULT WITH MESH;  Surgeon: Greer Pickerel, MD;  Location: [place];  Service: General;  Laterality: Right;  . INGUINAL HERNIA REPAIR     Dr. Redmond Pulling 04-24-18  . INGUINAL HERNIA REPAIR Right 04/24/2018   Procedure: DIAGNOSTIC LAPAROSCOPIC, OPENREPAIR OF RECURRENT RIGHT INGUINAL HERNIA WITH MESH ERAS PATHWAY;  Surgeon: Greer Pickerel, MD;  Location: [facility];  Service: General;  Laterality: Right;  . INSERTION OF MESH Bilateral 08/03/2017   Procedure: INSERTION OF MESH;  Surgeon: Greer Pickerel, MD;  Location[facility];  Service: General;  Laterality: Bilateral;  . NM MYOVIEW LTD     negative  . TONSILLECTOMY  1937  . TOOTH EXTRACTION  09/16/13   Dr Carlos American    Allergies  Allergen Reactions  . Trimethoprim Other (See Comments)    Headaches/ ear pressure   . Erythromycin Other (See Comments)    UNSPECIFIED REACTION   . Macrodantin [Nitrofurantoin Macrocrystal] Other (See Comments)    UNSPECIFIED REACTION   . Penicillins Other (See Comments)    UNSPECIFIED REACTION  Has patient had a PCN reaction causing immediate rash,  facial/tongue/throat swelling, SOB or lightheadedness with hypotension: Unknown Has patient had a PCN reaction causing severe rash involving mucus membranes or skin necrosis: Unknown Has patient had a PCN reaction that required hospitalization: No Has patient had a PCN reaction occurring within the last 10 years: No If all of the above answers are "NO", then may proceed with Cephalosporin use.  . Sulfa Antibiotics Other (See Comments)    UNSPECIFIED REACTION   . Latex Itching    Outpatient Encounter Medications as of 07/03/2018  Medication Sig  . acetaminophen (TYLENOL) 500 MG tablet Take 500 mg by mouth 2 (two) times daily as needed for mild pain or headache.   . bimatoprost (LUMIGAN) 0.01 % SOLN Place 1 drop into both eyes at bedtime.  . Calcium Carbonate-Vitamin D 600-400 MG-UNIT chew tablet Chew 1 tablet by mouth 2 (two) times daily.   . clopidogrel (PLAVIX) 75 MG tablet TAKE 1 TABLET ONCE A DAY TO PREVENT STROKE.  Marland Kitchen CRANBERRY EXTRACT PO Take 1 tablet by mouth daily.  . famotidine (PEPCID AC) 10 MG chewable tablet Chew 10 mg by mouth at bedtime as needed for heartburn.  . hydrocortisone cream 1 % Apply 1 application topically daily as needed for itching.   . metoprolol succinate (TOPROL-XL) 25 MG 24 hr tablet Take 1 tablet (25 mg total) by mouth daily.  . Multiple Vitamins-Minerals (PRESERVISION AREDS 2 PO) Take 1 capsule by mouth 2 (two) times daily.   Vladimir Faster Glycol-Propyl Glycol (SYSTANE ULTRA) 0.4-0.3 % SOLN Place 1 vial into both eyes 2 (two) times daily. May use another dose during the day as needed for dry eye   . psyllium (METAMUCIL) 58.6 % packet Take 1 packet by mouth daily. Mix with water  . traMADol (ULTRAM) 50 MG tablet Take 1 tablet (50 mg total) by mouth every 6 (six) hours as needed for severe pain.  . vitamin C (ASCORBIC ACID) 500 MG tablet Take 500 mg by mouth daily with supper.    No facility-administered encounter medications on file as of 07/03/2018.     Review  of Systems:  Review of Systems  Constitutional: Negative.   HENT: Positive for postnasal drip and sore throat. Negative for trouble swallowing.   Respiratory: Negative for cough and shortness of breath.   Cardiovascular: Negative.   Gastrointestinal: Negative.   Genitourinary: Negative.   Musculoskeletal: Negative.   Neurological: Negative.   Psychiatric/Behavioral: Negative.     Health Maintenance  Topic Date Due  . MAMMOGRAM  05/02/2019  . TETANUS/TDAP  03/20/2020  . INFLUENZA VACCINE  Completed  . DEXA SCAN  Completed  . PNA vac Low Risk Adult  Completed    Physical Exam: Vitals:   07/03/18 1604  BP: 122/74  Pulse: 78  Temp: (!) 97.5 F (36.4 C)  TempSrc: Oral  SpO2: 97%  Weight: 128 lb (58.1 kg)  Height: 5\' 2"  (1.575 m)   Body  mass index is 23.41 kg/m. Physical Exam Constitutional:      Appearance: Normal appearance.  HENT:     Head: Normocephalic.     Nose: Nose normal. No congestion or rhinorrhea.     Mouth/Throat:     Mouth: Mucous membranes are moist.     Pharynx: Posterior oropharyngeal erythema present. No oropharyngeal exudate.  Eyes:     Pupils: Pupils are equal, round, and reactive to light.  Neck:     Musculoskeletal: Neck supple.  Cardiovascular:     Rate and Rhythm: Normal rate and regular rhythm.  Pulmonary:     Effort: Pulmonary effort is normal. No respiratory distress.     Breath sounds: Normal breath sounds. No wheezing.  Abdominal:     General: Abdomen is flat. Bowel sounds are normal.     Palpations: Abdomen is soft.  Musculoskeletal:     Comments: Trace Edema Right LE  Skin:    General: Skin is warm and dry.  Neurological:     General: No focal deficit present.     Mental Status: She is alert and oriented to person, place, and time.  Psychiatric:        Mood and Affect: Mood normal.        Thought Content: Thought content normal.     Labs reviewed: Basic Metabolic Panel: Recent Labs    07/26/17 1428 08/03/17 1342  08/04/17 0629 04/15/18 1204  NA 138  --  134* 138  K 4.2  --  4.3 3.8  CL 103  --  99* 101  CO2 25  --  24 30  GLUCOSE 88  --  109* 91  BUN 17  --  12 24*  CREATININE 0.67 0.73 0.70 0.59  CALCIUM 9.0  --  8.5* 9.6   Liver Function Tests: Recent Labs    07/26/17 1428 04/15/18 1204  AST 24 24  ALT 19 21  ALKPHOS 52 59  BILITOT 0.5 0.5  PROT 6.2* 6.9  ALBUMIN 3.7 4.7   No results for input(s): LIPASE, AMYLASE in the last 8760 hours. No results for input(s): AMMONIA in the last 8760 hours. CBC: Recent Labs    07/26/17 1428 04/15/18 1204  WBC 7.3 8.2  NEUTROABS 4.6 5.0  HGB 12.4 13.1  HCT 37.7 42.6  MCV 94.3 97.0  PLT 203 218   Lipid Panel: No results for input(s): CHOL, HDL, LDLCALC, TRIG, CHOLHDL, LDLDIRECT in the last 8760 hours. Lab Results  Component Value Date   HGBA1C 5.3 05/02/2013    Procedures since last visit: No results found.  Assessment/Plan Sore Throat At this time she has no fever or Exudate I reassured her that this is Viral illness. I have told her to call our office if she spikes fever. Or has productive cough or SOB Follow up for her regular appointment   Labs/tests ordered:  @ORDERS @ Next appt:  10/18/2018

## 2018-07-04 DIAGNOSIS — R2681 Unsteadiness on feet: Secondary | ICD-10-CM | POA: Diagnosis not present

## 2018-07-04 DIAGNOSIS — M6281 Muscle weakness (generalized): Secondary | ICD-10-CM | POA: Diagnosis not present

## 2018-07-08 DIAGNOSIS — R2681 Unsteadiness on feet: Secondary | ICD-10-CM | POA: Diagnosis not present

## 2018-07-08 DIAGNOSIS — M6281 Muscle weakness (generalized): Secondary | ICD-10-CM | POA: Diagnosis not present

## 2018-07-09 DIAGNOSIS — R2681 Unsteadiness on feet: Secondary | ICD-10-CM | POA: Diagnosis not present

## 2018-07-09 DIAGNOSIS — M6281 Muscle weakness (generalized): Secondary | ICD-10-CM | POA: Diagnosis not present

## 2018-07-12 DIAGNOSIS — M6281 Muscle weakness (generalized): Secondary | ICD-10-CM | POA: Diagnosis not present

## 2018-07-12 DIAGNOSIS — R2681 Unsteadiness on feet: Secondary | ICD-10-CM | POA: Diagnosis not present

## 2018-07-15 DIAGNOSIS — M6281 Muscle weakness (generalized): Secondary | ICD-10-CM | POA: Diagnosis not present

## 2018-07-15 DIAGNOSIS — R2681 Unsteadiness on feet: Secondary | ICD-10-CM | POA: Diagnosis not present

## 2018-07-16 DIAGNOSIS — R2681 Unsteadiness on feet: Secondary | ICD-10-CM | POA: Diagnosis not present

## 2018-07-16 DIAGNOSIS — M6281 Muscle weakness (generalized): Secondary | ICD-10-CM | POA: Diagnosis not present

## 2018-07-19 DIAGNOSIS — R2681 Unsteadiness on feet: Secondary | ICD-10-CM | POA: Diagnosis not present

## 2018-07-19 DIAGNOSIS — M6281 Muscle weakness (generalized): Secondary | ICD-10-CM | POA: Diagnosis not present

## 2018-07-22 DIAGNOSIS — M6281 Muscle weakness (generalized): Secondary | ICD-10-CM | POA: Diagnosis not present

## 2018-07-22 DIAGNOSIS — R2681 Unsteadiness on feet: Secondary | ICD-10-CM | POA: Diagnosis not present

## 2018-07-24 DIAGNOSIS — R2681 Unsteadiness on feet: Secondary | ICD-10-CM | POA: Diagnosis not present

## 2018-07-24 DIAGNOSIS — M6281 Muscle weakness (generalized): Secondary | ICD-10-CM | POA: Diagnosis not present

## 2018-07-26 DIAGNOSIS — M6281 Muscle weakness (generalized): Secondary | ICD-10-CM | POA: Diagnosis not present

## 2018-07-26 DIAGNOSIS — R2681 Unsteadiness on feet: Secondary | ICD-10-CM | POA: Diagnosis not present

## 2018-07-29 DIAGNOSIS — M6281 Muscle weakness (generalized): Secondary | ICD-10-CM | POA: Diagnosis not present

## 2018-07-29 DIAGNOSIS — R2681 Unsteadiness on feet: Secondary | ICD-10-CM | POA: Diagnosis not present

## 2018-07-30 DIAGNOSIS — M6281 Muscle weakness (generalized): Secondary | ICD-10-CM | POA: Diagnosis not present

## 2018-07-30 DIAGNOSIS — R2681 Unsteadiness on feet: Secondary | ICD-10-CM | POA: Diagnosis not present

## 2018-08-01 DIAGNOSIS — R2681 Unsteadiness on feet: Secondary | ICD-10-CM | POA: Diagnosis not present

## 2018-08-01 DIAGNOSIS — M6281 Muscle weakness (generalized): Secondary | ICD-10-CM | POA: Diagnosis not present

## 2018-08-05 DIAGNOSIS — M6281 Muscle weakness (generalized): Secondary | ICD-10-CM | POA: Diagnosis not present

## 2018-08-05 DIAGNOSIS — R2681 Unsteadiness on feet: Secondary | ICD-10-CM | POA: Diagnosis not present

## 2018-08-06 DIAGNOSIS — R2681 Unsteadiness on feet: Secondary | ICD-10-CM | POA: Diagnosis not present

## 2018-08-06 DIAGNOSIS — M6281 Muscle weakness (generalized): Secondary | ICD-10-CM | POA: Diagnosis not present

## 2018-08-08 DIAGNOSIS — M6281 Muscle weakness (generalized): Secondary | ICD-10-CM | POA: Diagnosis not present

## 2018-08-08 DIAGNOSIS — R2681 Unsteadiness on feet: Secondary | ICD-10-CM | POA: Diagnosis not present

## 2018-08-12 DIAGNOSIS — R2681 Unsteadiness on feet: Secondary | ICD-10-CM | POA: Diagnosis not present

## 2018-08-12 DIAGNOSIS — M6281 Muscle weakness (generalized): Secondary | ICD-10-CM | POA: Diagnosis not present

## 2018-08-13 DIAGNOSIS — R2681 Unsteadiness on feet: Secondary | ICD-10-CM | POA: Diagnosis not present

## 2018-08-13 DIAGNOSIS — M6281 Muscle weakness (generalized): Secondary | ICD-10-CM | POA: Diagnosis not present

## 2018-08-15 DIAGNOSIS — M6281 Muscle weakness (generalized): Secondary | ICD-10-CM | POA: Diagnosis not present

## 2018-08-15 DIAGNOSIS — R2681 Unsteadiness on feet: Secondary | ICD-10-CM | POA: Diagnosis not present

## 2018-09-18 ENCOUNTER — Encounter: Payer: Self-pay | Admitting: Internal Medicine

## 2018-09-18 ENCOUNTER — Non-Acute Institutional Stay: Payer: Medicare Other | Admitting: Internal Medicine

## 2018-09-18 ENCOUNTER — Other Ambulatory Visit: Payer: Self-pay

## 2018-09-18 VITALS — BP 118/76 | HR 63 | Temp 97.4°F | Ht 62.0 in | Wt 126.0 lb

## 2018-09-18 DIAGNOSIS — B001 Herpesviral vesicular dermatitis: Secondary | ICD-10-CM

## 2018-09-18 DIAGNOSIS — S80861A Insect bite (nonvenomous), right lower leg, initial encounter: Secondary | ICD-10-CM

## 2018-09-18 DIAGNOSIS — W57XXXA Bitten or stung by nonvenomous insect and other nonvenomous arthropods, initial encounter: Secondary | ICD-10-CM

## 2018-09-18 NOTE — Progress Notes (Addendum)
Location:  [place] of Service:  Clinic (12)  Provider:   Code Status:  Goals of Care:  Advanced Directives 09/18/2018  Does Patient Have a Medical Advance Directive? Yes  Type of Paramedic of [place];Out of facility DNR (pink MOST or yellow form);Living will  Does patient want to make changes to medical advance directive? No - Patient declined  Copy of [place] in Chart? Yes - validated most recent copy scanned in chart (See row information)  Pre-existing out of facility DNR order (yellow form or pink MOST form) Yellow form placed in chart (order not valid for inpatient use)     Chief Complaint  Patient presents with  . Acute Visit    conerns with dry lips, burns a little,insect bite on right leg     HPI: Patient is a 85 y.o. female seen today for medical management of chronic diseases.    Patient has h/o Hypertension, GERD, Hypothyroidism, Hyperlipidemia and Recent hernia repair  She follows with Dr Mariea Clonts in office for her routine visits She came today as she had noticed some sores on her lips for past few weeks.The sores arre now healed but her lips were dry. She also Had insect bite on her leg which turned red and swollen but it is better now. She is taking precautions for Covid and did not have any complains of SOB or Cough or fever  Past Medical History:  Diagnosis Date  . Cervicalgia   . Chest pain at rest 01/07/2012  . Displacement of cervical intervertebral disc without myelopathy   . Diverticulosis of colon (without mention of hemorrhage)   . Female stress incontinence   . GERD (gastroesophageal reflux disease)    no peds occasional pepcid  . Inguinal hernia without mention of obstruction or gangrene, unilateral or unspecified, (not specified as recurrent)   . Internal hemorrhoids without mention of complication   . Lumbago   . Macular degeneration (senile) of retina, unspecified   . Osteoarthrosis,  unspecified whether generalized or localized, unspecified site   . Other and unspecified hyperlipidemia   . Pain in joint, hand   . Palpitations   . Reflux esophagitis   . Scoliosis (and kyphoscoliosis), idiopathic   . Senile osteoporosis   . Skin disorder   . Thyroid disease   . TIA (transient ischemic attack)   . Unruptured popliteal cyst 06/24/2014   Right knee   . Unspecified essential hypertension   . Unspecified glaucoma(365.9)   . Unspecified hypothyroidism   . Unspecified vitamin D deficiency     Past Surgical History:  Procedure Laterality Date  . ABDOMINAL HYSTERECTOMY  1975   Dr Mallie Mussel  . APPENDECTOMY  1988  . cardiolite myocardial perfusion study    . DOPPLER ECHOCARDIOGRAPHY    . EYE SURGERY Bilateral 2009   cataract removed right eye, Dr Charise Killian  . FOOT SURGERY  august 2013   Doran Durand, MD  . INGUINAL HERNIA REPAIR Bilateral    w/mesh  . INGUINAL HERNIA REPAIR Left 08/03/2017   Procedure: LAPAROSCOPIC LEFT INGUINAL HERNIA REPAIR WITH MESH;  Surgeon: Greer Pickerel, MD;  Location: [place];  Service: General;  Laterality: Left;  . INGUINAL HERNIA REPAIR Right 08/03/2017   Procedure: HERNIA REPAIR RIGHT INGUINAL ADULT WITH MESH;  Surgeon: Greer Pickerel, MD;  Location: [place];  Service: General;  Laterality: Right;  . INGUINAL HERNIA REPAIR     Dr. Redmond Pulling 04-24-18  . INGUINAL HERNIA REPAIR Right  04/24/2018   Procedure: DIAGNOSTIC LAPAROSCOPIC, OPENREPAIR OF RECURRENT RIGHT INGUINAL HERNIA WITH MESH ERAS PATHWAY;  Surgeon: Greer Pickerel, MD;  Location: [facility];  Service: General;  Laterality: Right;  . INSERTION OF MESH Bilateral 08/03/2017   Procedure: INSERTION OF MESH;  Surgeon: Greer Pickerel, MD;  Location: [place];  Service: General;  Laterality: Bilateral;  . NM MYOVIEW LTD     negative  . TONSILLECTOMY  1937  . TOOTH EXTRACTION  09/16/13   Dr Carlos American    Allergies  Allergen Reactions  . Trimethoprim Other (See Comments)    Headaches/ ear pressure   . Erythromycin Other  (See Comments)    UNSPECIFIED REACTION   . Macrodantin [Nitrofurantoin Macrocrystal] Other (See Comments)    UNSPECIFIED REACTION   . Penicillins Other (See Comments)    UNSPECIFIED REACTION  Has patient had a PCN reaction causing immediate rash, facial/tongue/throat swelling, SOB or lightheadedness with hypotension: Unknown Has patient had a PCN reaction causing severe rash involving mucus membranes or skin necrosis: Unknown Has patient had a PCN reaction that required hospitalization: No Has patient had a PCN reaction occurring within the last 10 years: No If all of the above answers are "NO", then may proceed with Cephalosporin use.  . Sulfa Antibiotics Other (See Comments)    UNSPECIFIED REACTION   . Latex Itching    Outpatient Encounter Medications as of 09/18/2018  Medication Sig  . acetaminophen (TYLENOL) 500 MG tablet Take 500 mg by mouth 2 (two) times daily as needed for mild pain or headache.   . bimatoprost (LUMIGAN) 0.01 % SOLN Place 1 drop into both eyes at bedtime.  . Calcium Carbonate-Vitamin D 600-400 MG-UNIT chew tablet Chew 1 tablet by mouth 2 (two) times daily.   . clopidogrel (PLAVIX) 75 MG tablet TAKE 1 TABLET ONCE A DAY TO PREVENT STROKE.  Marland Kitchen CRANBERRY EXTRACT PO Take 1 tablet by mouth daily.  . famotidine (PEPCID AC) 10 MG chewable tablet Chew 10 mg by mouth at bedtime as needed for heartburn.  . hydrocortisone cream 1 % Apply 1 application topically daily as needed for itching.   . metoprolol succinate (TOPROL-XL) 25 MG 24 hr tablet Take 1 tablet (25 mg total) by mouth daily.  . Multiple Vitamins-Minerals (PRESERVISION AREDS 2 PO) Take 1 capsule by mouth 2 (two) times daily.   Vladimir Faster Glycol-Propyl Glycol (SYSTANE ULTRA) 0.4-0.3 % SOLN Place 1 vial into both eyes 2 (two) times daily. May use another dose during the day as needed for dry eye   . psyllium (METAMUCIL) 58.6 % packet Take 1 packet by mouth daily. Mix with water  . vitamin C (ASCORBIC ACID) 500 MG  tablet Take 500 mg by mouth daily with supper.   . [DISCONTINUED] traMADol (ULTRAM) 50 MG tablet Take 1 tablet (50 mg total) by mouth every 6 (six) hours as needed for severe pain.   No facility-administered encounter medications on file as of 09/18/2018.     Review of Systems:  Review of Systems  Review of Systems  Constitutional: Negative for activity change, appetite change, chills, diaphoresis, fatigue and fever.  HENT: Negative for  postnasal drip, rhinorrhea, sinus pain and sore throat.   Respiratory: Negative for apnea, cough, chest tightness, shortness of breath and wheezing.   Cardiovascular: Negative for chest pain, palpitations and leg swelling.  Gastrointestinal: Negative for abdominal distention, abdominal pain, constipation, diarrhea, nausea and vomiting.  Genitourinary: Negative for dysuria and frequency.  Musculoskeletal: Negative for arthralgias, joint swelling and myalgias.  Skin: Negative for rash.  Neurological: Negative for dizziness, syncope, weakness, light-headedness and numbness.  Psychiatric/Behavioral: Negative for behavioral problems, confusion and sleep disturbance.     Health Maintenance  Topic Date Due  . INFLUENZA VACCINE  12/14/2018  . MAMMOGRAM  05/02/2019  . TETANUS/TDAP  03/20/2020  . DEXA SCAN  Completed  . PNA vac Low Risk Adult  Completed    Physical Exam: There were no vitals filed for this visit. There is no height or weight on file to calculate BMI. Physical Exam Vitals signs reviewed.  Constitutional:      Appearance: Normal appearance.  HENT:     Head: Normocephalic.     Nose: Nose normal.     Mouth/Throat:     Comments: She had dry lips and her Cold sore is now almost healed Eyes:     Pupils: Pupils are equal, round, and reactive to light.  Cardiovascular:     Rate and Rhythm: Normal rate and regular rhythm.     Pulses: Normal pulses.  Pulmonary:     Effort: Pulmonary effort is normal.     Breath sounds: Normal breath  sounds.  Abdominal:     General: Abdomen is flat. Bowel sounds are normal.     Palpations: Abdomen is soft.  Musculoskeletal:     Comments: Had a small place in her Right leg which was red but most of the swelling is resolved and no signs of Infection  Skin:    General: Skin is warm and dry.  Neurological:     General: No focal deficit present.     Mental Status: She is alert and oriented to person, place, and time.  Psychiatric:        Mood and Affect: Mood normal.        Thought Content: Thought content normal.     Labs reviewed: Basic Metabolic Panel: Recent Labs    04/15/18 1204  NA 138  K 3.8  CL 101  CO2 30  GLUCOSE 91  BUN 24*  CREATININE 0.59  CALCIUM 9.6   Liver Function Tests: Recent Labs    04/15/18 1204  AST 24  ALT 21  ALKPHOS 59  BILITOT 0.5  PROT 6.9  ALBUMIN 4.7   No results for input(s): LIPASE, AMYLASE in the last 8760 hours. No results for input(s): AMMONIA in the last 8760 hours. CBC: Recent Labs    04/15/18 1204  WBC 8.2  NEUTROABS 5.0  HGB 13.1  HCT 42.6  MCV 97.0  PLT 218   Lipid Panel: No results for input(s): CHOL, HDL, LDLCALC, TRIG, CHOLHDL, LDLDIRECT in the last 8760 hours. Lab Results  Component Value Date   HGBA1C 5.3 05/02/2013    Procedures since last visit: No results found.  Assessment/Plan Cold Sore Mostly resolved To use Vaseline for few days Insect Bite Now resolved I have told her to use hydrocortisone cream as needed with her Antibiotic cream  She will follow with Dr Mariea Clonts in office as she requested Labs/tests ordered:  @ORDERS @ Next appt:  10/18/2018

## 2018-09-25 ENCOUNTER — Other Ambulatory Visit: Payer: Self-pay

## 2018-09-25 ENCOUNTER — Non-Acute Institutional Stay: Payer: Medicare Other | Admitting: Internal Medicine

## 2018-09-25 ENCOUNTER — Encounter: Payer: Self-pay | Admitting: Internal Medicine

## 2018-09-25 VITALS — BP 124/76 | HR 64 | Temp 98.0°F | Ht 60.0 in | Wt 128.8 lb

## 2018-09-25 DIAGNOSIS — R109 Unspecified abdominal pain: Secondary | ICD-10-CM

## 2018-09-25 DIAGNOSIS — I10 Essential (primary) hypertension: Secondary | ICD-10-CM | POA: Diagnosis not present

## 2018-09-25 NOTE — Progress Notes (Signed)
Location: [facility] of Service:  Clinic (12)  Provider:   Code Status:  Goals of Care:  Advanced Directives 09/25/2018  Does Patient Have a Medical Advance Directive? Yes  Type of Advance Directive Living will;Healthcare Power of [place];Out of facility DNR (pink MOST or yellow form)  Does patient want to make changes to medical advance directive? No - Patient declined  Copy of [place] in Chart? Yes - validated most recent copy scanned in chart (See row information)  Pre-existing out of facility DNR order (yellow form or pink MOST form) Yellow form placed in chart (order not valid for inpatient use);Pink MOST form placed in chart (order not valid for inpatient use)     Chief Complaint  Patient presents with  . Acute Visit    Back discomfort for 4 days feeling better since drinking more water, no pian when urinating    HPI: Patient is a 85 y.o. female seen today for an acute visit for Low Back pain in her Left lateral Side Patient has h/o Hypertension, GERD, Hypothyroidism, Hyperlipidemia and Recent hernia repair  She noticed some lower Back pain She says she thought it was her kidney pain . She increased her water intake. And since then her Pain is  better and now almost resolved. Denied any Dysuria, Hematuria or fever  No Falls.  Past Medical History:  Diagnosis Date  . Cervicalgia   . Chest pain at rest 01/07/2012  . Displacement of cervical intervertebral disc without myelopathy   . Diverticulosis of colon (without mention of hemorrhage)   . Female stress incontinence   . GERD (gastroesophageal reflux disease)    no peds occasional pepcid  . Inguinal hernia without mention of obstruction or gangrene, unilateral or unspecified, (not specified as recurrent)   . Internal hemorrhoids without mention of complication   . Lumbago   . Macular degeneration (senile) of retina, unspecified   . Osteoarthrosis, unspecified whether generalized  or localized, unspecified site   . Other and unspecified hyperlipidemia   . Pain in joint, hand   . Palpitations   . Reflux esophagitis   . Scoliosis (and kyphoscoliosis), idiopathic   . Senile osteoporosis   . Skin disorder   . Thyroid disease   . TIA (transient ischemic attack)   . Unruptured popliteal cyst 06/24/2014   Right knee   . Unspecified essential hypertension   . Unspecified glaucoma(365.9)   . Unspecified hypothyroidism   . Unspecified vitamin D deficiency     Past Surgical History:  Procedure Laterality Date  . ABDOMINAL HYSTERECTOMY  1975   Dr Mallie Mussel  . APPENDECTOMY  1988  . cardiolite myocardial perfusion study    . DOPPLER ECHOCARDIOGRAPHY    . EYE SURGERY Bilateral 2009   cataract removed right eye, Dr Charise Killian  . FOOT SURGERY  august 2013   Doran Durand, MD  . INGUINAL HERNIA REPAIR Bilateral    w/mesh  . INGUINAL HERNIA REPAIR Left 08/03/2017   Procedure: LAPAROSCOPIC LEFT INGUINAL HERNIA REPAIR WITH MESH;  Surgeon: Greer Pickerel, MD;  Location: [facility];  Service: General;  Laterality: Left;  . INGUINAL HERNIA REPAIR Right 08/03/2017   Procedure: HERNIA REPAIR RIGHT INGUINAL ADULT WITH MESH;  Surgeon: Greer Pickerel, MD;  Location: [facility];  Service: General;  Laterality: Right;  . INGUINAL HERNIA REPAIR     Dr. Redmond Pulling 04-24-18  . INGUINAL HERNIA REPAIR Right 04/24/2018   Procedure: DIAGNOSTIC LAPAROSCOPIC, OPENREPAIR OF RECURRENT RIGHT INGUINAL HERNIA WITH  MESH ERAS PATHWAY;  Surgeon: Greer Pickerel, MD;  Location: [facility];  Service: General;  Laterality: Right;  . INSERTION OF MESH Bilateral 08/03/2017   Procedure: INSERTION OF MESH;  Surgeon: Greer Pickerel, MD;  Location: [facility];  Service: General;  Laterality: Bilateral;  . NM MYOVIEW LTD     negative  . TONSILLECTOMY  1937  . TOOTH EXTRACTION  09/16/13   Dr Carlos American    Allergies  Allergen Reactions  . Trimethoprim Other (See Comments)    Headaches/ ear pressure   . Erythromycin Other (See Comments)    UNSPECIFIED  REACTION   . Macrodantin [Nitrofurantoin Macrocrystal] Other (See Comments)    UNSPECIFIED REACTION   . Penicillins Other (See Comments)    UNSPECIFIED REACTION  Has patient had a PCN reaction causing immediate rash, facial/tongue/throat swelling, SOB or lightheadedness with hypotension: Unknown Has patient had a PCN reaction causing severe rash involving mucus membranes or skin necrosis: Unknown Has patient had a PCN reaction that required hospitalization: No Has patient had a PCN reaction occurring within the last 10 years: No If all of the above answers are "NO", then may proceed with Cephalosporin use.  . Sulfa Antibiotics Other (See Comments)    UNSPECIFIED REACTION   . Latex Itching    Outpatient Encounter Medications as of 09/25/2018  Medication Sig  . acetaminophen (TYLENOL) 500 MG tablet Take 500 mg by mouth 2 (two) times daily as needed for mild pain or headache.   . bimatoprost (LUMIGAN) 0.01 % SOLN Place 1 drop into both eyes at bedtime.  . Calcium Carbonate-Vitamin D 600-400 MG-UNIT chew tablet Chew 1 tablet by mouth 2 (two) times daily.   . clopidogrel (PLAVIX) 75 MG tablet TAKE 1 TABLET ONCE A DAY TO PREVENT STROKE.  Marland Kitchen CRANBERRY EXTRACT PO Take 1 tablet by mouth daily.  . famotidine (PEPCID AC) 10 MG chewable tablet Chew 10 mg by mouth at bedtime as needed for heartburn.  . hydrocortisone cream 1 % Apply 1 application topically daily as needed for itching.   . metoprolol succinate (TOPROL-XL) 25 MG 24 hr tablet Take 1 tablet (25 mg total) by mouth daily.  . Multiple Vitamins-Minerals (PRESERVISION AREDS 2 PO) Take 1 capsule by mouth 2 (two) times daily.   Vladimir Faster Glycol-Propyl Glycol (SYSTANE ULTRA) 0.4-0.3 % SOLN Place 1 vial into both eyes 2 (two) times daily. May use another dose during the day as needed for dry eye   . psyllium (METAMUCIL) 58.6 % packet Take 1 packet by mouth daily. Mix with water  . vitamin C (ASCORBIC ACID) 500 MG tablet Take 500 mg by mouth daily  with supper.    No facility-administered encounter medications on file as of 09/25/2018.     Review of Systems:  Review of Systems  Constitutional: Negative for fever.  HENT: Negative.   Respiratory: Negative.   Cardiovascular: Negative.   Genitourinary: Positive for flank pain. Negative for dysuria and hematuria.  Musculoskeletal: Positive for back pain.  Skin: Negative.   Neurological: Negative.   Psychiatric/Behavioral: Negative.     Health Maintenance  Topic Date Due  . INFLUENZA VACCINE  12/14/2018  . MAMMOGRAM  05/02/2019  . TETANUS/TDAP  03/20/2020  . DEXA SCAN  Completed  . PNA vac Low Risk Adult  Completed    Physical Exam: Vitals:   09/25/18 1612  BP: 124/76  Pulse: 64  Temp: 98 F (36.7 C)  TempSrc: Oral  SpO2: 98%  Weight: 128 lb 12.8 oz (58.4 kg)  Height: 5' (1.524 m)   Body mass index is 25.15 kg/m. Physical Exam Vitals signs reviewed.  Constitutional:      Appearance: Normal appearance.  HENT:     Head: Normocephalic.     Nose: Nose normal.     Mouth/Throat:     Mouth: Mucous membranes are dry.     Pharynx: Oropharynx is clear.  Eyes:     Pupils: Pupils are equal, round, and reactive to light.  Neck:     Musculoskeletal: Neck supple.  Cardiovascular:     Rate and Rhythm: Normal rate and regular rhythm.     Pulses: Normal pulses.  Pulmonary:     Effort: Pulmonary effort is normal. No respiratory distress.     Breath sounds: No wheezing or rales.  Abdominal:     General: Abdomen is flat. Bowel sounds are normal. There is no distension.     Palpations: Abdomen is soft.     Tenderness: There is no abdominal tenderness. There is no guarding.  Musculoskeletal:        General: No swelling.     Comments: Has some tenderness in her Left Flank  Skin:    General: Skin is warm and dry.  Neurological:     General: No focal deficit present.     Mental Status: She is alert.  Psychiatric:        Mood and Affect: Mood normal.        Thought  Content: Thought content normal.        Judgment: Judgment normal.     Labs reviewed: Basic Metabolic Panel: Recent Labs    04/15/18 1204  NA 138  K 3.8  CL 101  CO2 30  GLUCOSE 91  BUN 24*  CREATININE 0.59  CALCIUM 9.6   Liver Function Tests: Recent Labs    04/15/18 1204  AST 24  ALT 21  ALKPHOS 59  BILITOT 0.5  PROT 6.9  ALBUMIN 4.7   No results for input(s): LIPASE, AMYLASE in the last 8760 hours. No results for input(s): AMMONIA in the last 8760 hours. CBC: Recent Labs    04/15/18 1204  WBC 8.2  NEUTROABS 5.0  HGB 13.1  HCT 42.6  MCV 97.0  PLT 218   Lipid Panel: No results for input(s): CHOL, HDL, LDLCALC, TRIG, CHOLHDL, LDLDIRECT in the last 8760 hours. Lab Results  Component Value Date   HGBA1C 5.3 05/02/2013    Procedures since last visit: No results found.  Assessment/Plan Low Left  Paraspinal  And Flank Area Pain Do not think it is renal. She was mildly tender on pressing in that area ? Muscular Pain Will check UA to rule out Hematuria  I have told her to try Tylenol next time when she has pain or OTC Muscular cream If pain persists she will come to follow up with is   Labs/tests ordered:  @ORDERS @ Next appt:  09/30/2018 Total time spent in this patient care encounter was  25_  minutes; greater than 50% of the visit spent counseling patient and staff, reviewing records , Labs and coordinating care for problems addressed at this encounter.

## 2018-09-26 NOTE — Addendum Note (Signed)
Addended by: GUPTA, ANJALI LALA on: 09/26/2018 11:49 AM   Modules accepted: Level of Service  

## 2018-09-30 ENCOUNTER — Other Ambulatory Visit: Payer: Medicare Other

## 2018-09-30 ENCOUNTER — Other Ambulatory Visit: Payer: Self-pay

## 2018-09-30 DIAGNOSIS — R109 Unspecified abdominal pain: Secondary | ICD-10-CM

## 2018-09-30 LAB — URINALYSIS
Bilirubin Urine: NEGATIVE
Glucose, UA: NEGATIVE
Hgb urine dipstick: NEGATIVE
Ketones, ur: NEGATIVE
Nitrite: NEGATIVE
Protein, ur: NEGATIVE
Specific Gravity, Urine: 1.008 (ref 1.001–1.03)
pH: 7 (ref 5.0–8.0)

## 2018-10-11 ENCOUNTER — Other Ambulatory Visit: Payer: Self-pay | Admitting: Internal Medicine

## 2018-10-11 DIAGNOSIS — G459 Transient cerebral ischemic attack, unspecified: Secondary | ICD-10-CM

## 2018-10-18 ENCOUNTER — Ambulatory Visit (INDEPENDENT_AMBULATORY_CARE_PROVIDER_SITE_OTHER): Payer: Medicare Other | Admitting: Family

## 2018-10-18 ENCOUNTER — Other Ambulatory Visit: Payer: Self-pay

## 2018-10-18 ENCOUNTER — Ambulatory Visit: Payer: Self-pay

## 2018-10-18 ENCOUNTER — Encounter: Payer: Self-pay | Admitting: Family

## 2018-10-18 DIAGNOSIS — Z0000 Encounter for general adult medical examination without abnormal findings: Secondary | ICD-10-CM

## 2018-10-18 DIAGNOSIS — I10 Essential (primary) hypertension: Secondary | ICD-10-CM

## 2018-10-18 NOTE — Patient Instructions (Addendum)
[name] , Thank you for taking time to come for your Medicare Wellness Visit. I appreciate your ongoing commitment to your health goals. Please review the following plan we discussed and let me know if I can assist you in the future.   Screening recommendations/referrals: Colonoscopy N/A Mammogram: Upcoming appointment in December,2020  Bone Density: Up to date  Recommended yearly ophthalmology/optometry visit for glaucoma screening and checkup Recommended yearly dental visit for hygiene and checkup  Vaccinations: Influenza vaccine: Up to date  Pneumococcal vaccine Up to date Tdap vaccine Up to date due 03/20/2020  Shingles vaccine Up to date   Advanced directives: Yes   Conditions/risks identified: Advance Age female > 83 yrs   Next appointment: 1 year   Preventive Care 83 Years and Older, Female Preventive care refers to lifestyle choices and visits with your health care provider that can promote health and wellness. What does preventive care include?  A yearly physical exam. This is also called an annual well check.  Dental exams once or twice a year.  Routine eye exams. Ask your health care provider how often you should have your eyes checked.  Personal lifestyle choices, including:  Daily care of your teeth and gums.  Regular physical activity.  Eating a healthy diet.  Avoiding tobacco and drug use.  Limiting alcohol use.  Practicing safe sex.  Taking low-dose aspirin every day.  Taking vitamin and mineral supplements as recommended by your health care provider. What happens during an annual well check? The services and screenings done by your health care provider during your annual well check will depend on your age, overall health, lifestyle risk factors, and family history of disease. Counseling  Your health care provider may ask you questions about your:  Alcohol use.  Tobacco use.  Drug use.  Emotional well-being.  Home and relationship  well-being.  Sexual activity.  Eating habits.  History of falls.  Memory and ability to understand (cognition).  Work and work Statistician.  Reproductive health. Screening  You may have the following tests or measurements:  Height, weight, and BMI.  Blood pressure.  Lipid and cholesterol levels. These may be checked every 5 years, or more frequently if you are over 19 years old.  Skin check.  Lung cancer screening. You may have this screening every year starting at age 42 if you have a 30-pack-year history of smoking and currently smoke or have quit within the past 15 years.  Fecal occult blood test (FOBT) of the stool. You may have this test every year starting at age 4.  Flexible sigmoidoscopy or colonoscopy. You may have a sigmoidoscopy every 5 years or a colonoscopy every 10 years starting at age 41.  Hepatitis C blood test.  Hepatitis B blood test.  Sexually transmitted disease (STD) testing.  Diabetes screening. This is done by checking your blood sugar (glucose) after you have not eaten for a while (fasting). You may have this done every 1-3 years.  Bone density scan. This is done to screen for osteoporosis. You may have this done starting at age 5.  Mammogram. This may be done every 1-2 years. Talk to your health care provider about how often you should have regular mammograms. Talk with your health care provider about your test results, treatment options, and if necessary, the need for more tests. Vaccines  Your health care provider may recommend certain vaccines, such as:  Influenza vaccine. This is recommended every year.  Tetanus, diphtheria, and acellular pertussis (Tdap, Td) vaccine.  You may need a Td booster every 10 years.  Zoster vaccine. You may need this after age 93.  Pneumococcal 13-valent conjugate (PCV13) vaccine. One dose is recommended after age 70.  Pneumococcal polysaccharide (PPSV23) vaccine. One dose is recommended after age  32. Talk to your health care provider about which screenings and vaccines you need and how often you need them. This information is not intended to replace advice given to you by your health care provider. Make sure you discuss any questions you have with your health care provider. Document Released: 05/28/2015 Document Revised: 01/19/2016 Document Reviewed: 03/02/2015 Elsevier Interactive Patient Education  2017 [place] Prevention in the Home Falls can cause injuries. They can happen to people of all ages. There are many things you can do to make your home safe and to help prevent falls. What can I do on the outside of my home?  Regularly fix the edges of walkways and driveways and fix any cracks.  Remove anything that might make you trip as you walk through a door, such as a raised step or threshold.  Trim any bushes or trees on the path to your home.  Use bright outdoor lighting.  Clear any walking paths of anything that might make someone trip, such as rocks or tools.  Regularly check to see if handrails are loose or broken. Make sure that both sides of any steps have handrails.  Any raised decks and porches should have guardrails on the edges.  Have any leaves, snow, or ice cleared regularly.  Use sand or salt on walking paths during winter.  Clean up any spills in your garage right away. This includes oil or grease spills. What can I do in the bathroom?  Use night lights.  Install grab bars by the toilet and in the tub and shower. Do not use towel bars as grab bars.  Use non-skid mats or decals in the tub or shower.  If you need to sit down in the shower, use a plastic, non-slip stool.  Keep the floor dry. Clean up any water that spills on the floor as soon as it happens.  Remove soap buildup in the tub or shower regularly.  Attach bath mats securely with double-sided non-slip rug tape.  Do not have throw rugs and other things on the floor that can make  you trip. What can I do in the bedroom?  Use night lights.  Make sure that you have a light by your bed that is easy to reach.  Do not use any sheets or blankets that are too big for your bed. They should not hang down onto the floor.  Have a firm chair that has side arms. You can use this for support while you get dressed.  Do not have throw rugs and other things on the floor that can make you trip. What can I do in the kitchen?  Clean up any spills right away.  Avoid walking on wet floors.  Keep items that you use a lot in easy-to-reach places.  If you need to reach something above you, use a strong step stool that has a grab bar.  Keep electrical cords out of the way.  Do not use floor polish or wax that makes floors slippery. If you must use wax, use non-skid floor wax.  Do not have throw rugs and other things on the floor that can make you trip. What can I do with my stairs?  Do not leave any  items on the stairs.  Make sure that there are handrails on both sides of the stairs and use them. Fix handrails that are broken or loose. Make sure that handrails are as long as the stairways.  Check any carpeting to make sure that it is firmly attached to the stairs. Fix any carpet that is loose or worn.  Avoid having throw rugs at the top or bottom of the stairs. If you do have throw rugs, attach them to the floor with carpet tape.  Make sure that you have a light switch at the top of the stairs and the bottom of the stairs. If you do not have them, ask someone to add them for you. What else can I do to help prevent falls?  Wear shoes that:  Do not have high heels.  Have rubber bottoms.  Are comfortable and fit you well.  Are closed at the toe. Do not wear sandals.  If you use a stepladder:  Make sure that it is fully opened. Do not climb a closed stepladder.  Make sure that both sides of the stepladder are locked into place.  Ask someone to hold it for you, if  possible.  Clearly mark and make sure that you can see:  Any grab bars or handrails.  First and last steps.  Where the edge of each step is.  Use tools that help you move around (mobility aids) if they are needed. These include:  Canes.  Walkers.  Scooters.  Crutches.  Turn on the lights when you go into a dark area. Replace any light bulbs as soon as they burn out.  Set up your furniture so you have a clear path. Avoid moving your furniture around.  If any of your floors are uneven, fix them.  If there are any pets around you, be aware of where they are.  Review your medicines with your doctor. Some medicines can make you feel dizzy. This can increase your chance of falling. Ask your doctor what other things that you can do to help prevent falls. This information is not intended to replace advice given to you by your health care provider. Make sure you discuss any questions you have with your health care provider. Document Released: 02/25/2009 Document Revised: 10/07/2015 Document Reviewed: 06/05/2014 Elsevier Interactive Patient Education  2017 [employer].

## 2018-10-18 NOTE — Progress Notes (Signed)
Subjective:   [name] is a 85 y.o. female who presents for Medicare Annual (Subsequent) preventive examination.  Review of Systems:  Cardiac Risk Factors include: advanced age (>49men, >69 women)  Objective:     Vitals: There were no vitals taken for this visit.  There is no height or weight on file to calculate BMI.  Advanced Directives 09/25/2018 09/18/2018 07/03/2018 04/24/2018 04/24/2018 04/15/2018 03/20/2018  Does Patient Have a Medical Advance Directive? Yes Yes Yes - Yes Yes Yes  Type of Advance Directive Living will;Healthcare Power of Attorney;Out of facility DNR (pink MOST or yellow form) [place];Out of facility DNR (pink MOST or yellow form);Living will Living will;Healthcare Power of [place];Out of facility DNR (pink MOST or yellow form) - [place];Living will [place];Living will [place];Living will;Out of facility DNR (pink MOST or yellow form)  Does patient want to make changes to medical advance directive? No - Patient declined No - Patient declined - No - Patient declined - No - Patient declined No - Patient declined  Copy of [place] in Chart? Yes - validated most recent copy scanned in chart (See row information) Yes - validated most recent copy scanned in chart (See row information) Yes - validated most recent copy scanned in chart (See row information) - Yes - validated most recent copy scanned in chart (See row information) Yes - validated most recent copy scanned in chart (See row information) Yes - validated most recent copy scanned in chart (See row information)  Pre-existing out of facility DNR order (yellow form or pink MOST form) Yellow form placed in chart (order not valid for inpatient use);Pink MOST form placed in chart (order not valid for inpatient use) Yellow form placed in chart (order not valid for inpatient use) Yellow form placed in chart (order not  valid for inpatient use) - - - Yellow form placed in chart (order not valid for inpatient use)    Tobacco Social History   Tobacco Use  Smoking Status Never Smoker  Smokeless Tobacco Never Used     Counseling given: Not Answered  Clinical Intake:  Pre-visit preparation completed: No  Pain : No/denies pain  BMI - recorded: 25.15(previous BMI) Nutritional Status: BMI 25 -29 Overweight Nutritional Risks: None Diabetes: No  How often do you need to have someone help you when you read instructions, pamphlets, or other written materials from your doctor or pharmacy?: 1 - Never What is the last grade level you completed in school?: Post-graduation university   Interpreter Needed?: No  Information entered by :: Mitchelle Sultan FNP-C   Past Medical History:  Diagnosis Date  . Cervicalgia   . Chest pain at rest 01/07/2012  . Displacement of cervical intervertebral disc without myelopathy   . Diverticulosis of colon (without mention of hemorrhage)   . Female stress incontinence   . GERD (gastroesophageal reflux disease)    no peds occasional pepcid  . Inguinal hernia without mention of obstruction or gangrene, unilateral or unspecified, (not specified as recurrent)   . Internal hemorrhoids without mention of complication   . Lumbago   . Macular degeneration (senile) of retina, unspecified   . Osteoarthrosis, unspecified whether generalized or localized, unspecified site   . Other and unspecified hyperlipidemia   . Pain in joint, hand   . Palpitations   . Reflux esophagitis   . Scoliosis (and kyphoscoliosis), idiopathic   . Senile osteoporosis   . Skin  disorder   . Thyroid disease   . TIA (transient ischemic attack)   . Unruptured popliteal cyst 06/24/2014   Right knee   . Unspecified essential hypertension   . Unspecified glaucoma(365.9)   . Unspecified hypothyroidism   . Unspecified vitamin D deficiency    Past Surgical History:  Procedure Laterality Date  . ABDOMINAL  HYSTERECTOMY  1975   Dr Mallie Mussel  . APPENDECTOMY  1988  . cardiolite myocardial perfusion study    . DOPPLER ECHOCARDIOGRAPHY    . EYE SURGERY Bilateral 2009   cataract removed right eye, Dr Charise Killian  . FOOT SURGERY  august 2013   Doran Durand, MD  . INGUINAL HERNIA REPAIR Bilateral    w/mesh  . INGUINAL HERNIA REPAIR Left 08/03/2017   Procedure: LAPAROSCOPIC LEFT INGUINAL HERNIA REPAIR WITH MESH;  Surgeon: Greer Pickerel, MD;  Location: [facility];  Service: General;  Laterality: Left;  . INGUINAL HERNIA REPAIR Right 08/03/2017   Procedure: HERNIA REPAIR RIGHT INGUINAL ADULT WITH MESH;  Surgeon: Greer Pickerel, MD;  Location: [facility];  Service: General;  Laterality: Right;  . INGUINAL HERNIA REPAIR     Dr. Redmond Pulling 04-24-18  . INGUINAL HERNIA REPAIR Right 04/24/2018   Procedure: DIAGNOSTIC LAPAROSCOPIC, OPENREPAIR OF RECURRENT RIGHT INGUINAL HERNIA WITH MESH ERAS PATHWAY;  Surgeon: Greer Pickerel, MD;  Location: [facility];  Service: General;  Laterality: Right;  . INSERTION OF MESH Bilateral 08/03/2017   Procedure: INSERTION OF MESH;  Surgeon: Greer Pickerel, MD;  Location: [facility];  Service: General;  Laterality: Bilateral;  . NM MYOVIEW LTD     negative  . TONSILLECTOMY  1937  . TOOTH EXTRACTION  09/16/13   Dr Carlos American   Family History  Problem Relation Age of Onset  . CAD Mother   . Parkinson's disease Mother   . Cancer Father        colon cancer  . Parkinson's disease Father   . Stroke Maternal Grandmother    Social History   Socioeconomic History  . Marital status: Widowed    Spouse name: Not on file  . Number of children: 2  . Years of education: college  . Highest education level: Not on file  Occupational History    Comment: retired  Scientific laboratory technician  . Financial resource strain: Not hard at all  . Food insecurity:    Worry: Never true    Inability: Never true  . Transportation needs:    Medical: No    Non-medical: No  Tobacco Use  . Smoking status: Never Smoker  . Smokeless tobacco: Never Used   Substance and Sexual Activity  . Alcohol use: No  . Drug use: No  . Sexual activity: Not Currently  Lifestyle  . Physical activity:    Days per week: 3 days    Minutes per session: 30 min  . Stress: Only a little  Relationships  . Social connections:    Talks on phone: More than three times a week    Gets together: More than three times a week    Attends religious service: More than 4 times per year    Active member of club or organization: Yes    Attends meetings of clubs or organizations: More than 4 times per year    Relationship status: Widowed  Other Topics Concern  . Not on file  Social History Narrative   Patient lives at home with her daughter [name]) , moved to [facility] 02/02/16 [place]    Widowed.   Retired.  EducationNurse, mental health.   Right handed.   Caffeine- None some times tea very rare.   Never smoked   Alcohol none    Outpatient Encounter Medications as of 10/18/2018  Medication Sig  . acetaminophen (TYLENOL) 500 MG tablet Take 500 mg by mouth 2 (two) times daily as needed for mild pain or headache.   . bimatoprost (LUMIGAN) 0.01 % SOLN Place 1 drop into both eyes at bedtime.  . Calcium Carbonate-Vitamin D 600-400 MG-UNIT chew tablet Chew 1 tablet by mouth 2 (two) times daily.   . clopidogrel (PLAVIX) 75 MG tablet TAKE 1 TABLET ONCE A DAY TO PREVENT STROKE.  Marland Kitchen CRANBERRY EXTRACT PO Take 1 tablet by mouth daily.  . famotidine (PEPCID AC) 10 MG chewable tablet Chew 10 mg by mouth at bedtime as needed for heartburn.  . hydrocortisone cream 1 % Apply 1 application topically daily as needed for itching.   . metoprolol succinate (TOPROL-XL) 25 MG 24 hr tablet Take 1 tablet (25 mg total) by mouth daily.  . Multiple Vitamins-Minerals (PRESERVISION AREDS 2 PO) Take 1 capsule by mouth 2 (two) times daily.   Vladimir Faster Glycol-Propyl Glycol (SYSTANE ULTRA) 0.4-0.3 % SOLN Place 1 vial into both eyes 2 (two) times daily. May use another dose during the day as needed for  dry eye   . psyllium (METAMUCIL) 58.6 % packet Take 1 packet by mouth daily. Mix with water  . vitamin C (ASCORBIC ACID) 500 MG tablet Take 500 mg by mouth daily with supper.    No facility-administered encounter medications on file as of 10/18/2018.     Activities of Daily Living In your present state of health, do you have any difficulty performing the following activities: 10/18/2018 04/24/2018  Hearing? N N  Vision? N N  Difficulty concentrating or making decisions? Y N  Comment remembering sometimes  -  Walking or climbing stairs? Y N  Comment uses a cane  -  Dressing or bathing? N N  Doing errands, shopping? N N  Preparing Food and eating ? N -  Using the Toilet? N -  In the past six months, have you accidently leaked urine? Y -  Comment wears a pad  -  Do you have problems with loss of bowel control? N -  Managing your Medications? N -  Managing your Finances? N -  Housekeeping or managing your Housekeeping? N -  Some recent data might be hidden    Patient Care Team: Gayland Curry, DO as PCP - General (Geriatric Medicine) Lorretta Harp, MD as PCP - Cardiology (Cardiology) Laurence Spates, MD as Consulting Physician (Gastroenterology) Myrlene Broker, MD as Attending Physician (Urology) Lorretta Harp, MD as Consulting Physician (Cardiology) Rozetta Nunnery, MD as Consulting Physician (Otolaryngology) Richarda Osmond, DDS (Dentistry) Shon Hough, MD as Consulting Physician (Ophthalmology) Iran Planas, MD as Consulting Physician (Orthopedic Surgery) Greer Pickerel, MD as Consulting Physician (General Surgery)    Assessment:   This is a routine wellness examination for [facility].  Exercise Activities and Dietary recommendations Current Exercise Habits: Structured exercise class, Type of exercise: stretching;Other - see comments(Nu-step ), Time (Minutes): 45, Frequency (Times/Week): 7, Weekly Exercise (Minutes/Week): 315, Intensity: Moderate, Exercise  limited by: None identified  Goals    . Increase physical activity     Starting 06/27/16, I will attempt to increase my physical activity.        Fall Risk Fall Risk  10/18/2018 09/25/2018 07/03/2018 06/27/2018 03/20/2018  Falls in the past year?  0 0 0 1 1  Number falls in past yr: 0 0 0 0 0  Comment - - - - -  Injury with Fall? 0 0 0 0 1  Risk for fall due to : - - - History of fall(s);Impaired balance/gait;Impaired mobility;Medication side effect -  Follow up - - - Falls evaluation completed;Education provided;Falls prevention discussed -   Is the patient's home free of loose throw rugs in walkways, pet beds, electrical cords, etc?   no      Grab bars in the bathroom? yes      Handrails on the stairs?   No steps       Adequate lighting?   yes  Depression Screen PHQ 2/9 Scores 10/18/2018 06/27/2018 02/25/2018 10/15/2017  PHQ - 2 Score 0 0 0 0     Cognitive Function MMSE - Mini Mental State Exam 10/15/2017 06/27/2016 06/22/2015 06/24/2014  Not completed: - - (No Data) -  Orientation to time 5 5 5 5   Orientation to Place 5 5 5 5   Registration 3 3 3 3   Attention/ Calculation 5 5 5 5   Recall 3 1 1 1   Language- name 2 objects 2 2 2 2   Language- repeat 1 1 1 1   Language- follow 3 step command 3 3 3 3   Language- read & follow direction 1 1 1 1   Write a sentence 1 1 1 1   Copy design 1 1 1 1   Total score 30 28 28 28      6CIT Screen 10/18/2018  What Year? 0 points  What month? 0 points  What time? 0 points  Count back from 20 0 points  Months in reverse 0 points  Repeat phrase 0 points  Total Score 0    Immunization History  Administered Date(s) Administered  . Influenza Whole 02/24/2011, 02/12/2012  . Influenza, High Dose Seasonal PF 02/01/2017, 02/25/2018  . Influenza,inj,Quad PF,6+ Mos 02/20/2013, 02/10/2014, 02/19/2015, 02/18/2016  . Pneumococcal Conjugate-13 06/24/2014  . Pneumococcal Polysaccharide-23 04/12/1998  . Td 02/21/1996, 07/23/2003  . Tdap 03/20/2010  . Zoster  09/15/2005    Qualifies for Shingles Vaccine? Up to date.  Screening Tests Health Maintenance  Topic Date Due  . INFLUENZA VACCINE  12/14/2018  . MAMMOGRAM  05/02/2019  . TETANUS/TDAP  03/20/2020  . DEXA SCAN  Completed  . PNA vac Low Risk Adult  Completed   Cancer Screenings: Lung: Low Dose CT Chest recommended if Age 9-80 years, 30 pack-year currently smoking OR have quit w/in 15years. Patient does not qualify. Breast:  Up to date on Mammogram? Yes   Up to date of Bone Density/Dexa? Yes Colorectal: N/A   Additional Screenings: Hepatitis C Screening: Low Risk     Plan:   - Has Mammogram appointment [id number]   I have personally reviewed and noted the following in the patient's chart:   . Medical and social history . Use of alcohol, tobacco or illicit drugs  . Current medications and supplements . Functional ability and status . Nutritional status . Physical activity . Advanced directives . List of other physicians . Hospitalizations, surgeries, and ER visits in previous 12 months . Vitals . Screenings to include cognitive, depression, and falls . Referrals and appointments  In addition, I have reviewed and discussed with patient certain preventive protocols, quality metrics, and best practice recommendations. A written personalized care plan for preventive services as well as general preventive health recommendations were provided to patient.  Sandrea Hughs, NP  10/18/2018

## 2018-10-18 NOTE — Progress Notes (Signed)
Patient ID: [name], female   DOB: 10/15/1928, 85 y.o.   MRN: [id number] This service is provided via telemedicine  No vital signs collected/recorded due to the encounter was a telemedicine visit.   Location of patient (ex: home, work):  HOME  Patient consents to a telephone visit:  YES  Location of the provider (ex: office, home):  OFFICE  Name of any referring provider:  TIFFANY REED, DO  Names of all persons participating in the telemedicine service and their role in the encounter:  PATIENT, [name], [place], [place], NP  Time spent on call:  11:03

## 2018-10-22 DIAGNOSIS — L814 Other melanin hyperpigmentation: Secondary | ICD-10-CM | POA: Diagnosis not present

## 2018-10-22 DIAGNOSIS — L570 Actinic keratosis: Secondary | ICD-10-CM | POA: Diagnosis not present

## 2018-10-22 DIAGNOSIS — L821 Other seborrheic keratosis: Secondary | ICD-10-CM | POA: Diagnosis not present

## 2018-10-22 DIAGNOSIS — D1801 Hemangioma of skin and subcutaneous tissue: Secondary | ICD-10-CM | POA: Diagnosis not present

## 2018-10-28 ENCOUNTER — Ambulatory Visit: Payer: Medicare Other | Admitting: Internal Medicine

## 2018-10-28 ENCOUNTER — Other Ambulatory Visit: Payer: Self-pay

## 2018-10-28 ENCOUNTER — Encounter: Payer: Self-pay | Admitting: *Deleted

## 2018-10-28 DIAGNOSIS — I10 Essential (primary) hypertension: Secondary | ICD-10-CM | POA: Diagnosis not present

## 2018-10-28 LAB — COMPLETE METABOLIC PANEL WITH GFR
AG Ratio: 2 (calc) (ref 1.0–2.5)
ALT: 13 U/L (ref 6–29)
AST: 18 U/L (ref 10–35)
Albumin: 4.3 g/dL (ref 3.6–5.1)
Alkaline phosphatase (APISO): 56 U/L (ref 37–153)
BUN: 20 mg/dL (ref 7–25)
CO2: 30 mmol/L (ref 20–32)
Calcium: 9.6 mg/dL (ref 8.6–10.4)
Chloride: 102 mmol/L (ref 98–110)
Creat: 0.65 mg/dL (ref 0.60–0.88)
GFR, Est African American: 92 mL/min/{1.73_m2} (ref >=60)
GFR, Est Non African American: 79 mL/min/{1.73_m2} (ref >=60)
Globulin: 2.2 g/dL (calc) (ref 1.9–3.7)
Glucose, Bld: 76 mg/dL (ref 65–99)
Potassium: 3.8 mmol/L (ref 3.5–5.3)
Sodium: 140 mmol/L (ref 135–146)
Total Bilirubin: 0.4 mg/dL (ref 0.2–1.2)
Total Protein: 6.5 g/dL (ref 6.1–8.1)

## 2018-10-28 LAB — CBC WITH DIFFERENTIAL/PLATELET
Absolute Monocytes: 623 cells/uL (ref 200–950)
Basophils Absolute: 67 cells/uL (ref 0–200)
Basophils Relative: 1 %
Eosinophils Absolute: 228 cells/uL (ref 15–500)
Eosinophils Relative: 3.4 %
HCT: 40.3 % (ref 35.0–45.0)
Hemoglobin: 13.2 g/dL (ref 11.7–15.5)
Lymphs Abs: 2928 cells/uL (ref 850–3900)
MCH: 30.6 pg (ref 27.0–33.0)
MCHC: 32.8 g/dL (ref 32.0–36.0)
MCV: 93.5 fL (ref 80.0–100.0)
MPV: 10.7 fL (ref 7.5–12.5)
Monocytes Relative: 9.3 %
Neutro Abs: 2854 cells/uL (ref 1500–7800)
Neutrophils Relative %: 42.6 %
Platelets: 228 10*3/uL (ref 140–400)
RBC: 4.31 10*6/uL (ref 3.80–5.10)
RDW: 13.9 % (ref 11.0–15.0)
Total Lymphocyte: 43.7 %
WBC: 6.7 10*3/uL (ref 3.8–10.8)

## 2018-10-29 ENCOUNTER — Other Ambulatory Visit: Payer: Self-pay

## 2018-11-04 ENCOUNTER — Ambulatory Visit (INDEPENDENT_AMBULATORY_CARE_PROVIDER_SITE_OTHER): Payer: Medicare Other | Admitting: Internal Medicine

## 2018-11-04 ENCOUNTER — Other Ambulatory Visit: Payer: Self-pay

## 2018-11-04 ENCOUNTER — Encounter: Payer: Self-pay | Admitting: Internal Medicine

## 2018-11-04 DIAGNOSIS — E039 Hypothyroidism, unspecified: Secondary | ICD-10-CM

## 2018-11-04 DIAGNOSIS — M8580 Other specified disorders of bone density and structure, unspecified site: Secondary | ICD-10-CM | POA: Diagnosis not present

## 2018-11-04 DIAGNOSIS — I10 Essential (primary) hypertension: Secondary | ICD-10-CM | POA: Diagnosis not present

## 2018-11-04 DIAGNOSIS — N3281 Overactive bladder: Secondary | ICD-10-CM | POA: Diagnosis not present

## 2018-11-04 DIAGNOSIS — H3530 Unspecified macular degeneration: Secondary | ICD-10-CM | POA: Diagnosis not present

## 2018-11-04 NOTE — Progress Notes (Signed)
Patient ID: [name], female   DOB: Aug 11, 1928, 85 y.o.   MRN: [id number] This service is provided via telemedicine  No vital signs collected/recorded due to the encounter was a telemedicine visit.   Location of patient (ex: home, work):  HOME  Patient consents to a telephone visit:  YES  Location of the provider (ex: office, home):  OFFICE  Name of any referring provider:  Simrit Gohlke, DO  Names of all persons participating in the telemedicine service and their role in the encounter:  PATIENT, [name], CMA., DR Hollace Kinnier, DO  Time spent on call:  5:16    Provider:  Sherilyn Windhorst L. Mariea Clonts, D.O., C.M.D.  Code Status: DNR Goals of Care:  Advanced Directives 09/25/2018  Does Patient Have a Medical Advance Directive? Yes  Type of Advance Directive Living will;Healthcare Power of [place];Out of facility DNR (pink MOST or yellow form)  Does patient want to make changes to medical advance directive? No - Patient declined  Copy of [place] in Chart? Yes - validated most recent copy scanned in chart (See row information)  Pre-existing out of facility DNR order (yellow form or pink MOST form) Yellow form placed in chart (order not valid for inpatient use);Pink MOST form placed in chart (order not valid for inpatient use)     Chief Complaint  Patient presents with  . Medical Management of Chronic Issues    4MTH FOLLOW-UP    HPI: Patient is a 85 y.o. female seen today for medical management of chronic diseases.    She's not been off campus at [facility] since the first of March.  Her daughter brings her some groceries.  Now they can have visits at 6 ft distance outside.    Reviewed her labs with her:  Cbc and cmp all within range.    She feels pretty good.  She's going to bed late and sleeping in late.  Last night, she did not sleep well.  Once in a while, she gets up in the middle of the night and takes a tylenol and then she can rest.    She had a  right-sided headache on Saturday.  It was in her forehead--had a little yesterday and today now.  No other symptoms.  She does not typically have headaches.   She has not been tested for covid.  Says one assistant in [place] was positive and worked with one resident who was negative.  One nurse later tested positive and worked with one resident that was positive.  Pt gets her groceries delivered.    She saw podiatry at [facility]--Dr. Henry--he trimmed her toenails.  The one big toe was bothering her on the right foot.    She saw a dermatology NP--she found two places on her scalp that were treated and she's to be seen again next month.  This was at [facility].  Eyes are doing well here lately.  She's checking her eye chart.  She canceled her appt with Dr. Kathrin Penner which was this last Friday due to concerns about covid though they had given her reassurance about cleaning, etc, but she feels her eyes are ok.  Her retina specialist had seen her at the end of December.  They recommended Dr. Posey Pronto for her--that appt is mid-July and she plans to keep it.  She is then seeing Dr. Kathrin Penner in Nov.  Overactive bladder:  She is leaking more than she would like.  She had urine test that was negative with  Dr. Lyndel Safe after she thought her left kidney was bothering her some. She is drinking a pitcher of water a day.  Recommended 6 8oz glasses of water per day.  She had seen Dr. Rosana Hoes about her bladder in the past.  She saw him about a year ago and he said her bladder looked good and worked well--he wanted her to see a physical therapist to work with her.  She got to that point just before the covid isolation.  She has a brochure from [employer] therapy at [facility] and she says that kind of thing was listed on there.  She likes to work with Jinny Blossom from there.  She had started to gradually spread out her trips to the restroom.  She can go for an hour without having to use the restroom.  She wears pads all of the time.    The pool has opened at [facility]  and there's a limited number of residents at a time and they have to sign up.  She really misses her stretching class.  She is not doing as well with exercise without that.    She asks if she is getting enough calcium with caltrate bid.  She gets 1200mg  per day with this and she also has yogurt, milk and other foods with calcium.  She gets 800  Units of vitamin D with it.  She says there are two pills that increase the bone density over 7 years--Algaecal--I looked at the website and advised against it.   When the weather is good and temp is right, she gets out and walks the whole way around the complex which is 0.8 miles.  Does almost daily.  She added a little bit so she gets a full mile in now.  She also walks some in the halls.    Past Medical History:  Diagnosis Date  . Cervicalgia   . Chest pain at rest 01/07/2012  . Displacement of cervical intervertebral disc without myelopathy   . Diverticulosis of colon (without mention of hemorrhage)   . Female stress incontinence   . GERD (gastroesophageal reflux disease)    no peds occasional pepcid  . Inguinal hernia without mention of obstruction or gangrene, unilateral or unspecified, (not specified as recurrent)   . Internal hemorrhoids without mention of complication   . Lumbago   . Macular degeneration (senile) of retina, unspecified   . Osteoarthrosis, unspecified whether generalized or localized, unspecified site   . Other and unspecified hyperlipidemia   . Pain in joint, hand   . Palpitations   . Reflux esophagitis   . Scoliosis (and kyphoscoliosis), idiopathic   . Senile osteoporosis   . Skin disorder   . Thyroid disease   . TIA (transient ischemic attack)   . Unruptured popliteal cyst 06/24/2014   Right knee   . Unspecified essential hypertension   . Unspecified glaucoma(365.9)   . Unspecified hypothyroidism   . Unspecified vitamin D deficiency     Past Surgical History:  Procedure Laterality Date  . ABDOMINAL HYSTERECTOMY   1975   Dr Mallie Mussel  . APPENDECTOMY  1988  . cardiolite myocardial perfusion study    . DOPPLER ECHOCARDIOGRAPHY    . EYE SURGERY Bilateral 2009   cataract removed right eye, Dr Charise Killian  . FOOT SURGERY  august 2013   Doran Durand, MD  . INGUINAL HERNIA REPAIR Bilateral    w/mesh  . INGUINAL HERNIA REPAIR Left 08/03/2017   Procedure: LAPAROSCOPIC LEFT INGUINAL HERNIA REPAIR WITH MESH;  Surgeon: Redmond Pulling,  [name], MD;  Location: [place];  Service: General;  Laterality: Left;  . INGUINAL HERNIA REPAIR Right 08/03/2017   Procedure: HERNIA REPAIR RIGHT INGUINAL ADULT WITH MESH;  Surgeon: Greer Pickerel, MD;  Location: [place];  Service: General;  Laterality: Right;  . INGUINAL HERNIA REPAIR     Dr. Redmond Pulling 04-24-18  . INGUINAL HERNIA REPAIR Right 04/24/2018   Procedure: DIAGNOSTIC LAPAROSCOPIC, OPENREPAIR OF RECURRENT RIGHT INGUINAL HERNIA WITH MESH ERAS PATHWAY;  Surgeon: Greer Pickerel, MD;  Location: [facility];  Service: General;  Laterality: Right;  . INSERTION OF MESH Bilateral 08/03/2017   Procedure: INSERTION OF MESH;  Surgeon: Greer Pickerel, MD;  Location: [place];  Service: General;  Laterality: Bilateral;  . NM MYOVIEW LTD     negative  . TONSILLECTOMY  1937  . TOOTH EXTRACTION  09/16/13   Dr Carlos American    Allergies  Allergen Reactions  . Trimethoprim Other (See Comments)    Headaches/ ear pressure   . Erythromycin Other (See Comments)    UNSPECIFIED REACTION   . Macrodantin [Nitrofurantoin Macrocrystal] Other (See Comments)    UNSPECIFIED REACTION   . Penicillins Other (See Comments)    UNSPECIFIED REACTION  Has patient had a PCN reaction causing immediate rash, facial/tongue/throat swelling, SOB or lightheadedness with hypotension: Unknown Has patient had a PCN reaction causing severe rash involving mucus membranes or skin necrosis: Unknown Has patient had a PCN reaction that required hospitalization: No Has patient had a PCN reaction occurring within the last 10 years: No If all of the above answers are  "NO", then may proceed with Cephalosporin use.  . Sulfa Antibiotics Other (See Comments)    UNSPECIFIED REACTION   . Latex Itching    Outpatient Encounter Medications as of 11/04/2018  Medication Sig  . acetaminophen (TYLENOL) 500 MG tablet Take 500 mg by mouth 2 (two) times daily as needed for mild pain or headache.   . bimatoprost (LUMIGAN) 0.01 % SOLN Place 1 drop into both eyes at bedtime.  . Calcium Carbonate-Vitamin D 600-400 MG-UNIT chew tablet Chew 1 tablet by mouth 2 (two) times daily.   . clopidogrel (PLAVIX) 75 MG tablet TAKE 1 TABLET ONCE A DAY TO PREVENT STROKE.  Marland Kitchen CRANBERRY EXTRACT PO Take 1 tablet by mouth daily.  . famotidine (PEPCID AC) 10 MG chewable tablet Chew 10 mg by mouth at bedtime as needed for heartburn.  . hydrocortisone cream 1 % Apply 1 application topically daily as needed for itching.   . metoprolol succinate (TOPROL-XL) 25 MG 24 hr tablet Take 1 tablet (25 mg total) by mouth daily.  . Multiple Vitamins-Minerals (PRESERVISION AREDS 2 PO) Take 1 capsule by mouth 2 (two) times daily.   Vladimir Faster Glycol-Propyl Glycol (SYSTANE ULTRA) 0.4-0.3 % SOLN Place 1 vial into both eyes 2 (two) times daily. May use another dose during the day as needed for dry eye   . psyllium (METAMUCIL) 58.6 % packet Take 1 packet by mouth daily. Mix with water  . vitamin C (ASCORBIC ACID) 500 MG tablet Take 500 mg by mouth daily with supper.    No facility-administered encounter medications on file as of 11/04/2018.     Review of Systems:  Review of Systems  Constitutional: Negative for chills, fever and malaise/fatigue.  Eyes: Negative for blurred vision.       Macular degeneration and glaucoma  Respiratory: Negative for cough and shortness of breath.   Cardiovascular: Negative for chest pain, palpitations and leg swelling.  Gastrointestinal: Negative for abdominal  pain, blood in stool, constipation, diarrhea and melena.  Genitourinary: Positive for frequency and urgency. Negative  for dysuria, flank pain and hematuria.  Musculoskeletal: Positive for joint pain. Negative for falls.  Skin: Negative for itching and rash.  Neurological: Positive for headaches. Negative for dizziness and loss of consciousness.  Endo/Heme/Allergies: Bruises/bleeds easily.  Psychiatric/Behavioral: Negative for depression and memory loss. The patient has insomnia. The patient is not nervous/anxious.        Off and on poor sleep    Health Maintenance  Topic Date Due  . INFLUENZA VACCINE  12/14/2018  . MAMMOGRAM  05/02/2019  . TETANUS/TDAP  03/20/2020  . DEXA SCAN  Completed  . PNA vac Low Risk Adult  Completed    Physical Exam: Could not be performed as visit non face-to-face via phone   Labs reviewed: Basic Metabolic Panel: Recent Labs    04/15/18 1204 10/28/18 0000  NA 138 140  K 3.8 3.8  CL 101 102  CO2 30 30  GLUCOSE 91 76  BUN 24* 20  CREATININE 0.59 0.65  CALCIUM 9.6 9.6   Liver Function Tests: Recent Labs    04/15/18 1204 10/28/18 0000  AST 24 18  ALT 21 13  ALKPHOS 59  --   BILITOT 0.5 0.4  PROT 6.9 6.5  ALBUMIN 4.7  --    No results for input(s): LIPASE, AMYLASE in the last 8760 hours. No results for input(s): AMMONIA in the last 8760 hours. CBC: Recent Labs    04/15/18 1204 10/28/18 0000  WBC 8.2 6.7  NEUTROABS 5.0 2,854  HGB 13.1 13.2  HCT 42.6 40.3  MCV 97.0 93.5  PLT 218 228   Lipid Panel: No results for input(s): CHOL, HDL, LDLCALC, TRIG, CHOLHDL, LDLDIRECT in the last 8760 hours. Lab Results  Component Value Date   HGBA1C 5.3 05/02/2013    Procedures since last visit: No results found.  Assessment/Plan 1. Macular degeneration of both eyes, unspecified type -reports doing well, keep appt with new retina specialist as planned, cont preservision areds2  2. Hypothyroidism, unspecified type -clinically and chemically euthyroid Lab Results  Component Value Date   TSH 3.87 06/23/2016    3. Overactive bladder -ongoing, she will  check with [facility] about availability of biofeedback therapy re: bladder control and call me back if referral is needed for this (had been suggested at Dr. Davis/urology office)  4. Essential hypertension -bp has been controlled per report  5. Senile osteopenia -reviewed calcium and D intake -approved current therapy -cont walking and get back to water aerobics when it is safe to do so  Labs/tests ordered:  No new Next appt:  4 mos med mgt Non face-to-face time spent on televisit:  34 minutes non face to face time  Amra Shukla L. Yulian Gosney, D.O. [facility] [address], [place] [address] Cell Phone (Mon-Fri 8am-5pm):  [phone] On Call:  [phone] & follow prompts after 5pm & weekends Office Phone:  [phone] Office Fax:  [phone]

## 2018-11-25 DIAGNOSIS — H35453 Secondary pigmentary degeneration, bilateral: Secondary | ICD-10-CM | POA: Diagnosis not present

## 2018-11-25 DIAGNOSIS — H35363 Drusen (degenerative) of macula, bilateral: Secondary | ICD-10-CM | POA: Diagnosis not present

## 2018-11-25 DIAGNOSIS — H353121 Nonexudative age-related macular degeneration, left eye, early dry stage: Secondary | ICD-10-CM | POA: Diagnosis not present

## 2018-11-25 DIAGNOSIS — H353112 Nonexudative age-related macular degeneration, right eye, intermediate dry stage: Secondary | ICD-10-CM | POA: Diagnosis not present

## 2018-11-25 DIAGNOSIS — Z961 Presence of intraocular lens: Secondary | ICD-10-CM | POA: Diagnosis not present

## 2018-11-26 DIAGNOSIS — L570 Actinic keratosis: Secondary | ICD-10-CM | POA: Diagnosis not present

## 2018-11-26 DIAGNOSIS — D1801 Hemangioma of skin and subcutaneous tissue: Secondary | ICD-10-CM | POA: Diagnosis not present

## 2018-11-26 DIAGNOSIS — L821 Other seborrheic keratosis: Secondary | ICD-10-CM | POA: Diagnosis not present

## 2018-11-26 DIAGNOSIS — L814 Other melanin hyperpigmentation: Secondary | ICD-10-CM | POA: Diagnosis not present

## 2018-12-11 DIAGNOSIS — M79672 Pain in left foot: Secondary | ICD-10-CM | POA: Diagnosis not present

## 2018-12-24 DIAGNOSIS — L814 Other melanin hyperpigmentation: Secondary | ICD-10-CM | POA: Diagnosis not present

## 2018-12-24 DIAGNOSIS — L853 Xerosis cutis: Secondary | ICD-10-CM | POA: Diagnosis not present

## 2018-12-24 DIAGNOSIS — L570 Actinic keratosis: Secondary | ICD-10-CM | POA: Diagnosis not present

## 2018-12-24 DIAGNOSIS — L821 Other seborrheic keratosis: Secondary | ICD-10-CM | POA: Diagnosis not present

## 2018-12-24 DIAGNOSIS — D1801 Hemangioma of skin and subcutaneous tissue: Secondary | ICD-10-CM | POA: Diagnosis not present

## 2018-12-24 DIAGNOSIS — L820 Inflamed seborrheic keratosis: Secondary | ICD-10-CM | POA: Diagnosis not present

## 2019-01-23 DIAGNOSIS — D1801 Hemangioma of skin and subcutaneous tissue: Secondary | ICD-10-CM | POA: Diagnosis not present

## 2019-01-23 DIAGNOSIS — L821 Other seborrheic keratosis: Secondary | ICD-10-CM | POA: Diagnosis not present

## 2019-01-23 DIAGNOSIS — L820 Inflamed seborrheic keratosis: Secondary | ICD-10-CM | POA: Diagnosis not present

## 2019-01-23 DIAGNOSIS — L814 Other melanin hyperpigmentation: Secondary | ICD-10-CM | POA: Diagnosis not present

## 2019-01-23 DIAGNOSIS — L853 Xerosis cutis: Secondary | ICD-10-CM | POA: Diagnosis not present

## 2019-01-23 DIAGNOSIS — L570 Actinic keratosis: Secondary | ICD-10-CM | POA: Diagnosis not present

## 2019-02-04 ENCOUNTER — Other Ambulatory Visit: Payer: Self-pay

## 2019-02-04 ENCOUNTER — Ambulatory Visit (INDEPENDENT_AMBULATORY_CARE_PROVIDER_SITE_OTHER): Payer: Medicare Other

## 2019-02-04 DIAGNOSIS — Z23 Encounter for immunization: Secondary | ICD-10-CM

## 2019-03-05 DIAGNOSIS — Z8262 Family history of osteoporosis: Secondary | ICD-10-CM | POA: Diagnosis not present

## 2019-03-05 DIAGNOSIS — M810 Age-related osteoporosis without current pathological fracture: Secondary | ICD-10-CM | POA: Diagnosis not present

## 2019-03-05 DIAGNOSIS — Z90710 Acquired absence of both cervix and uterus: Secondary | ICD-10-CM | POA: Diagnosis not present

## 2019-03-05 LAB — HM DEXA SCAN

## 2019-03-06 ENCOUNTER — Encounter: Payer: Self-pay | Admitting: Internal Medicine

## 2019-03-10 ENCOUNTER — Encounter: Payer: Self-pay | Admitting: Internal Medicine

## 2019-03-10 ENCOUNTER — Other Ambulatory Visit: Payer: Self-pay

## 2019-03-10 ENCOUNTER — Ambulatory Visit (INDEPENDENT_AMBULATORY_CARE_PROVIDER_SITE_OTHER): Payer: Medicare Other | Admitting: Internal Medicine

## 2019-03-10 VITALS — BP 120/74 | HR 62 | Temp 98.1°F | Ht 60.0 in | Wt 125.6 lb

## 2019-03-10 DIAGNOSIS — H3530 Unspecified macular degeneration: Secondary | ICD-10-CM | POA: Insufficient documentation

## 2019-03-10 DIAGNOSIS — M79661 Pain in right lower leg: Secondary | ICD-10-CM | POA: Diagnosis not present

## 2019-03-10 DIAGNOSIS — M810 Age-related osteoporosis without current pathological fracture: Secondary | ICD-10-CM

## 2019-03-10 DIAGNOSIS — K4090 Unilateral inguinal hernia, without obstruction or gangrene, not specified as recurrent: Secondary | ICD-10-CM | POA: Diagnosis not present

## 2019-03-10 DIAGNOSIS — G459 Transient cerebral ischemic attack, unspecified: Secondary | ICD-10-CM | POA: Diagnosis not present

## 2019-03-10 MED ORDER — CLOPIDOGREL BISULFATE 75 MG PO TABS: ORAL_TABLET | ORAL | 1 refills | Status: DC

## 2019-03-10 NOTE — Patient Instructions (Addendum)
I recommend increasing the vitamin D3 that you are getting. Try getting back to using some light weights and balance exercises.  I will refer back to [facility] for PT for this and also for your strength and balance overall.

## 2019-03-10 NOTE — Progress Notes (Signed)
Location:  [facility] clinic  Provider: Dr. Hollace Kinnier  Code Status:  Goals of Care:  Advanced Directives 03/10/2019  Does Patient Have a Medical Advance Directive? Yes  Type of Advance Directive Out of facility DNR (pink MOST or yellow form)  Does patient want to make changes to medical advance directive? No - Guardian declined  Copy of [place] in Chart? -  Pre-existing out of facility DNR order (yellow form or pink MOST form) Yellow form placed in chart (order not valid for inpatient use)     Chief Complaint  Patient presents with  . Medical Management of Chronic Issues    78-month followup and medication management.  . Best Practice Recommendations    Mammogram - states has scheduled for end of December    HPI: Patient is a 85 y.o. female seen today for medical management of chronic diseases.    She still resides at [facility] in independent living.   Had just vacationed at [place] last week with son and family.   She read about the benefits of saffron and asked Dr. Posey Pronto if she could take it. She was hoping it would help with her macular degeneration. Dr. Posey Pronto knew of the study she was referencing and gave his approval. She started the supplement and had a lot of GI upset. She had to stop 5-6 days later. Next appointment with Dr.Patel, January.   Eats 2 meals a day. She will eat a big breakfast every day and then either lunch or dinner based on the menu.   Walks with a cane . Denies any recent falls or injuries. Interested in seeing physical therapy again in an effort to help with her balance. Walks a few times a week. Also stretches daily.   She has noticed pain in her right shin. The pain ocurrs when getting up. The pain is not constant. Pain is described as sharp. No interventions done to help with pain.   She claims her hernia has returned within the last two weeks. She is thinking about making an appointment with Dr. Redmond Pulling to follow up.  She notices the hernia pain occurs when she stands a lot. She also stated the pain occurs after supper. She is having regular bowel movements. Takes metamucil daily.   She has received her flu vaccine.    She just had her bone density test.               Past Medical History:  Diagnosis Date  . Cervicalgia   . Chest pain at rest 01/07/2012  . Displacement of cervical intervertebral disc without myelopathy   . Diverticulosis of colon (without mention of hemorrhage)   . Female stress incontinence   . GERD (gastroesophageal reflux disease)    no peds occasional pepcid  . Inguinal hernia without mention of obstruction or gangrene, unilateral or unspecified, (not specified as recurrent)   . Internal hemorrhoids without mention of complication   . Lumbago   . Macular degeneration (senile) of retina, unspecified   . Osteoarthrosis, unspecified whether generalized or localized, unspecified site   . Other and unspecified hyperlipidemia   . Pain in joint, hand   . Palpitations   . Reflux esophagitis   . Scoliosis (and kyphoscoliosis), idiopathic   . Senile osteoporosis   . Skin disorder   . Thyroid disease   . TIA (transient ischemic attack)   . Unruptured popliteal cyst 06/24/2014   Right knee   . Unspecified essential hypertension   .  Unspecified glaucoma(365.9)   . Unspecified hypothyroidism   . Unspecified vitamin D deficiency     Past Surgical History:  Procedure Laterality Date  . ABDOMINAL HYSTERECTOMY  1975   Dr Mallie Mussel  . APPENDECTOMY  1988  . cardiolite myocardial perfusion study    . DOPPLER ECHOCARDIOGRAPHY    . EYE SURGERY Bilateral 2009   cataract removed right eye, Dr Charise Killian  . FOOT SURGERY  august 2013   Doran Durand, MD  . INGUINAL HERNIA REPAIR Bilateral    w/mesh  . INGUINAL HERNIA REPAIR Left 08/03/2017   Procedure: LAPAROSCOPIC LEFT INGUINAL HERNIA REPAIR WITH MESH;  Surgeon: Greer Pickerel, MD;  Location: [place];  Service: General;  Laterality: Left;  .  INGUINAL HERNIA REPAIR Right 08/03/2017   Procedure: HERNIA REPAIR RIGHT INGUINAL ADULT WITH MESH;  Surgeon: Greer Pickerel, MD;  Location: [place];  Service: General;  Laterality: Right;  . INGUINAL HERNIA REPAIR     Dr. Redmond Pulling 04-24-18  . INGUINAL HERNIA REPAIR Right 04/24/2018   Procedure: DIAGNOSTIC LAPAROSCOPIC, OPENREPAIR OF RECURRENT RIGHT INGUINAL HERNIA WITH MESH ERAS PATHWAY;  Surgeon: Greer Pickerel, MD;  Location: [facility];  Service: General;  Laterality: Right;  . INSERTION OF MESH Bilateral 08/03/2017   Procedure: INSERTION OF MESH;  Surgeon: Greer Pickerel, MD;  Location: [place];  Service: General;  Laterality: Bilateral;  . NM MYOVIEW LTD     negative  . TONSILLECTOMY  1937  . TOOTH EXTRACTION  09/16/13   Dr Carlos American    Allergies  Allergen Reactions  . Trimethoprim Other (See Comments)    Headaches/ ear pressure   . Erythromycin Other (See Comments)    UNSPECIFIED REACTION   . Macrodantin [Nitrofurantoin Macrocrystal] Other (See Comments)    UNSPECIFIED REACTION   . Penicillins Other (See Comments)    UNSPECIFIED REACTION  Has patient had a PCN reaction causing immediate rash, facial/tongue/throat swelling, SOB or lightheadedness with hypotension: Unknown Has patient had a PCN reaction causing severe rash involving mucus membranes or skin necrosis: Unknown Has patient had a PCN reaction that required hospitalization: No Has patient had a PCN reaction occurring within the last 10 years: No If all of the above answers are "NO", then may proceed with Cephalosporin use.  . Sulfa Antibiotics Other (See Comments)    UNSPECIFIED REACTION   . Latex Itching    Outpatient Encounter Medications as of 03/10/2019  Medication Sig  . acetaminophen (TYLENOL) 500 MG tablet Take 500 mg by mouth 2 (two) times daily as needed for mild pain or headache.   . bimatoprost (LUMIGAN) 0.01 % SOLN Place 1 drop into both eyes at bedtime.  . Calcium Carbonate-Vitamin D 600-400 MG-UNIT chew tablet Chew 1  tablet by mouth 2 (two) times daily.   . camphor-menthol (SARNA) lotion Apply 1 application topically as needed for itching. Prescribed by Dermatology at [facility]  . clopidogrel (PLAVIX) 75 MG tablet One tablet daily to prevent stroke  . CRANBERRY EXTRACT PO Take 1 tablet by mouth daily.  . famotidine (PEPCID AC) 10 MG chewable tablet Chew 10 mg by mouth at bedtime as needed for heartburn.  . hydrocortisone cream 1 % Apply 1 application topically daily as needed for itching.   . metoprolol succinate (TOPROL-XL) 25 MG 24 hr tablet Take 1 tablet (25 mg total) by mouth daily.  . Multiple Vitamins-Minerals (PRESERVISION AREDS 2 PO) Take 1 capsule by mouth 2 (two) times daily.   Vladimir Faster Glycol-Propyl Glycol (SYSTANE ULTRA) 0.4-0.3 % SOLN Place 1  vial into both eyes 2 (two) times daily. May use another dose during the day as needed for dry eye   . psyllium (METAMUCIL) 58.6 % packet Take 1 packet by mouth daily. Mix with water  . vitamin C (ASCORBIC ACID) 500 MG tablet Take 500 mg by mouth daily with supper.   . [DISCONTINUED] clopidogrel (PLAVIX) 75 MG tablet TAKE 1 TABLET ONCE A DAY TO PREVENT STROKE.   No facility-administered encounter medications on file as of 03/10/2019.     Review of Systems:  Review of Systems  Constitutional: Negative for activity change, appetite change and fatigue.  HENT: Positive for dental problem. Negative for hearing loss and trouble swallowing.   Eyes: Negative for photophobia and visual disturbance.       Macular degeneration. Wears glasses  Respiratory: Negative for cough, shortness of breath and wheezing.   Cardiovascular: Negative for chest pain and leg swelling.  Gastrointestinal: Positive for abdominal pain. Negative for constipation, diarrhea and nausea.       Hernia pain  Endocrine: Negative for polydipsia, polyphagia and polyuria.  Genitourinary: Negative for dysuria, frequency, hematuria and urgency.  Musculoskeletal:       Right shin pain.    Skin: Negative.   Neurological: Negative for dizziness, weakness and headaches.  Psychiatric/Behavioral: Negative for dysphoric mood and sleep disturbance. The patient is not nervous/anxious.   All other systems reviewed and are negative.   Health Maintenance  Topic Date Due  . MAMMOGRAM  05/02/2019  . TETANUS/TDAP  03/20/2020  . INFLUENZA VACCINE  Completed  . DEXA SCAN  Completed  . PNA vac Low Risk Adult  Completed    Physical Exam: Vitals:   03/10/19 1130  BP: 120/74  Pulse: 62  Temp: 98.1 F (36.7 C)  TempSrc: Oral  SpO2: 98%  Weight: 125 lb 9.6 oz (57 kg)  Height: 5' (1.524 m)   Body mass index is 24.53 kg/m. Physical Exam Vitals signs reviewed.  Constitutional:      Appearance: Normal appearance. She is normal weight.  HENT:     Head: Normocephalic.  Eyes:     General:        Right eye: No discharge.        Left eye: No discharge.  Cardiovascular:     Rate and Rhythm: Normal rate and regular rhythm.     Pulses: Normal pulses.     Heart sounds: Normal heart sounds. No murmur.  Pulmonary:     Effort: Pulmonary effort is normal. No respiratory distress.     Breath sounds: Normal breath sounds. No wheezing.  Abdominal:     General: Bowel sounds are normal. There is distension.     Palpations: Abdomen is soft. There is no mass.     Tenderness: There is no abdominal tenderness. There is no guarding.  Musculoskeletal:     Right lower leg: No edema.     Left lower leg: No edema.     Comments: Right shin absent of bruising, abrasion or discoloration.   Skin:    General: Skin is warm and dry.     Capillary Refill: Capillary refill takes less than 2 seconds.  Neurological:     General: No focal deficit present.     Mental Status: She is alert and oriented to person, place, and time. Mental status is at baseline.     Sensory: No sensory deficit.     Motor: No weakness.     Gait: Gait normal.  Psychiatric:  Mood and Affect: Mood normal.         Behavior: Behavior normal.        Thought Content: Thought content normal.        Judgment: Judgment normal.     Labs reviewed: Basic Metabolic Panel: Recent Labs    04/15/18 1204 10/28/18 0000  NA 138 140  K 3.8 3.8  CL 101 102  CO2 30 30  GLUCOSE 91 76  BUN 24* 20  CREATININE 0.59 0.65  CALCIUM 9.6 9.6   Liver Function Tests: Recent Labs    04/15/18 1204 10/28/18 0000  AST 24 18  ALT 21 13  ALKPHOS 59  --   BILITOT 0.5 0.4  PROT 6.9 6.5  ALBUMIN 4.7  --    No results for input(s): LIPASE, AMYLASE in the last 8760 hours. No results for input(s): AMMONIA in the last 8760 hours. CBC: Recent Labs    04/15/18 1204 10/28/18 0000  WBC 8.2 6.7  NEUTROABS 5.0 2,854  HGB 13.1 13.2  HCT 42.6 40.3  MCV 97.0 93.5  PLT 218 228   Lipid Panel: No results for input(s): CHOL, HDL, LDLCALC, TRIG, CHOLHDL, LDLDIRECT in the last 8760 hours. Lab Results  Component Value Date   HGBA1C 5.3 05/02/2013    Procedures since last visit: No results found.  Assessment/Plan 1. Transient cerebral ischemia, unspecified type - stable at this time - clopidogrel (PLAVIX) 75 MG tablet; One tablet daily to prevent stroke  Dispense: 90 tablet; Refill: 1  2. Senile osteoprosis - Dexa scan results suggest further progression of disease - suggest restarting physical therapy if balance remains a issue - she is at high risk for fracture, patient educated about safety in home and when ambulating  3. Macular degeneration of both eyes, unspecified type - stable at this time - followed by Dr. Posey Pronto -continue next scheduled appointment in January 2021  4. Right groin hernia - abdomen appears distended and non-tender - followed by Dr. Greer Pickerel - advised patient to see Dr. Redmond Pulling if abdominal pain and discomfort does not subside  5. Pain in right shin - suspect previous beach trip could have strained her right shin - advised patient to rest leg a few times a day - avoid any vigorous  or strenous exercise - may take tylenol for pain - contact PCP is it does not subside after a month      Labs/tests ordered:  Lipid panel, complete blood count with differential/platelets, complete metabolic panel with GFR - future Next appt:  4 month follow up

## 2019-03-11 ENCOUNTER — Telehealth: Payer: Self-pay

## 2019-03-11 NOTE — Telephone Encounter (Signed)
Patient called to request a change to her therapist from [name] to [name]  she said she told [name] that she wanted [name] but this was incorrect she really meant [name] and the phone number for [name] is [phone]  and this is though [facility] and their number is [phone] [facility] got answering machine left message that patient wanted to switch therapist and if they need a verbal or written order to change patient form [name] to [place] please call office at our number I left number.

## 2019-03-12 ENCOUNTER — Telehealth: Payer: Self-pay

## 2019-03-12 NOTE — Telephone Encounter (Signed)
Patient called this afternoon and stated she was seen in office on Monday by provider and they discussed vitamin d. Patient states she would like to know if provider would like her to start taking vitamin D. She states she was told by the provider to stop. Please advise.

## 2019-03-13 NOTE — Telephone Encounter (Signed)
I had asked her to find out what dose of vitamin D she was taking before--she said she had it in her medicine cabinet.  She had wanted to cut back on pills before, but now we know her osteoporosis got worse so we need her to go back to taking it.

## 2019-03-13 NOTE — Telephone Encounter (Signed)
The patient called and I asked her about the vitamin D.  She had called earlier in the week and stated she was taking D3 2000 units daily, in addition to the vitamin D that was in the Caltrate.  When I questioned her today she stated she was trying to reduce her pills and was no longer taking a separate D3.

## 2019-03-13 NOTE — Telephone Encounter (Signed)
Returned call to patient. Discussed providers response to her question. She verbalized understanding and denied further questions.

## 2019-03-13 NOTE — Telephone Encounter (Signed)
Tried calling patient to discuss providers response. No answer. LMOM for patient to call office.

## 2019-03-20 DIAGNOSIS — H04123 Dry eye syndrome of bilateral lacrimal glands: Secondary | ICD-10-CM | POA: Diagnosis not present

## 2019-03-20 DIAGNOSIS — H401132 Primary open-angle glaucoma, bilateral, moderate stage: Secondary | ICD-10-CM | POA: Diagnosis not present

## 2019-03-20 DIAGNOSIS — H353132 Nonexudative age-related macular degeneration, bilateral, intermediate dry stage: Secondary | ICD-10-CM | POA: Diagnosis not present

## 2019-03-20 DIAGNOSIS — H52203 Unspecified astigmatism, bilateral: Secondary | ICD-10-CM | POA: Diagnosis not present

## 2019-03-21 DIAGNOSIS — M6281 Muscle weakness (generalized): Secondary | ICD-10-CM | POA: Diagnosis not present

## 2019-03-21 DIAGNOSIS — M79604 Pain in right leg: Secondary | ICD-10-CM | POA: Diagnosis not present

## 2019-03-21 DIAGNOSIS — R2681 Unsteadiness on feet: Secondary | ICD-10-CM | POA: Diagnosis not present

## 2019-03-21 DIAGNOSIS — Z9181 History of falling: Secondary | ICD-10-CM | POA: Diagnosis not present

## 2019-03-21 DIAGNOSIS — M810 Age-related osteoporosis without current pathological fracture: Secondary | ICD-10-CM | POA: Diagnosis not present

## 2019-03-25 DIAGNOSIS — D1801 Hemangioma of skin and subcutaneous tissue: Secondary | ICD-10-CM | POA: Diagnosis not present

## 2019-03-25 DIAGNOSIS — M6281 Muscle weakness (generalized): Secondary | ICD-10-CM | POA: Diagnosis not present

## 2019-03-25 DIAGNOSIS — M79604 Pain in right leg: Secondary | ICD-10-CM | POA: Diagnosis not present

## 2019-03-25 DIAGNOSIS — M810 Age-related osteoporosis without current pathological fracture: Secondary | ICD-10-CM | POA: Diagnosis not present

## 2019-03-25 DIAGNOSIS — L820 Inflamed seborrheic keratosis: Secondary | ICD-10-CM | POA: Diagnosis not present

## 2019-03-25 DIAGNOSIS — R2681 Unsteadiness on feet: Secondary | ICD-10-CM | POA: Diagnosis not present

## 2019-03-25 DIAGNOSIS — L814 Other melanin hyperpigmentation: Secondary | ICD-10-CM | POA: Diagnosis not present

## 2019-03-25 DIAGNOSIS — L853 Xerosis cutis: Secondary | ICD-10-CM | POA: Diagnosis not present

## 2019-03-25 DIAGNOSIS — Z9181 History of falling: Secondary | ICD-10-CM | POA: Diagnosis not present

## 2019-03-25 DIAGNOSIS — L821 Other seborrheic keratosis: Secondary | ICD-10-CM | POA: Diagnosis not present

## 2019-03-25 DIAGNOSIS — L570 Actinic keratosis: Secondary | ICD-10-CM | POA: Diagnosis not present

## 2019-03-27 DIAGNOSIS — M79604 Pain in right leg: Secondary | ICD-10-CM | POA: Diagnosis not present

## 2019-03-27 DIAGNOSIS — M810 Age-related osteoporosis without current pathological fracture: Secondary | ICD-10-CM | POA: Diagnosis not present

## 2019-03-27 DIAGNOSIS — M6281 Muscle weakness (generalized): Secondary | ICD-10-CM | POA: Diagnosis not present

## 2019-03-27 DIAGNOSIS — Z9181 History of falling: Secondary | ICD-10-CM | POA: Diagnosis not present

## 2019-03-27 DIAGNOSIS — R2681 Unsteadiness on feet: Secondary | ICD-10-CM | POA: Diagnosis not present

## 2019-04-01 DIAGNOSIS — Z9181 History of falling: Secondary | ICD-10-CM | POA: Diagnosis not present

## 2019-04-01 DIAGNOSIS — M810 Age-related osteoporosis without current pathological fracture: Secondary | ICD-10-CM | POA: Diagnosis not present

## 2019-04-01 DIAGNOSIS — M79604 Pain in right leg: Secondary | ICD-10-CM | POA: Diagnosis not present

## 2019-04-01 DIAGNOSIS — R2681 Unsteadiness on feet: Secondary | ICD-10-CM | POA: Diagnosis not present

## 2019-04-01 DIAGNOSIS — M6281 Muscle weakness (generalized): Secondary | ICD-10-CM | POA: Diagnosis not present

## 2019-04-02 DIAGNOSIS — R102 Pelvic and perineal pain: Secondary | ICD-10-CM | POA: Diagnosis not present

## 2019-04-02 DIAGNOSIS — R1909 Other intra-abdominal and pelvic swelling, mass and lump: Secondary | ICD-10-CM | POA: Diagnosis not present

## 2019-04-03 DIAGNOSIS — M810 Age-related osteoporosis without current pathological fracture: Secondary | ICD-10-CM | POA: Diagnosis not present

## 2019-04-03 DIAGNOSIS — M79604 Pain in right leg: Secondary | ICD-10-CM | POA: Diagnosis not present

## 2019-04-03 DIAGNOSIS — R2681 Unsteadiness on feet: Secondary | ICD-10-CM | POA: Diagnosis not present

## 2019-04-03 DIAGNOSIS — M6281 Muscle weakness (generalized): Secondary | ICD-10-CM | POA: Diagnosis not present

## 2019-04-03 DIAGNOSIS — Z9181 History of falling: Secondary | ICD-10-CM | POA: Diagnosis not present

## 2019-04-08 DIAGNOSIS — R2681 Unsteadiness on feet: Secondary | ICD-10-CM | POA: Diagnosis not present

## 2019-04-08 DIAGNOSIS — M79604 Pain in right leg: Secondary | ICD-10-CM | POA: Diagnosis not present

## 2019-04-08 DIAGNOSIS — Z9181 History of falling: Secondary | ICD-10-CM | POA: Diagnosis not present

## 2019-04-08 DIAGNOSIS — M810 Age-related osteoporosis without current pathological fracture: Secondary | ICD-10-CM | POA: Diagnosis not present

## 2019-04-08 DIAGNOSIS — M6281 Muscle weakness (generalized): Secondary | ICD-10-CM | POA: Diagnosis not present

## 2019-04-09 DIAGNOSIS — M6281 Muscle weakness (generalized): Secondary | ICD-10-CM | POA: Diagnosis not present

## 2019-04-09 DIAGNOSIS — R2681 Unsteadiness on feet: Secondary | ICD-10-CM | POA: Diagnosis not present

## 2019-04-09 DIAGNOSIS — M810 Age-related osteoporosis without current pathological fracture: Secondary | ICD-10-CM | POA: Diagnosis not present

## 2019-04-09 DIAGNOSIS — Z9181 History of falling: Secondary | ICD-10-CM | POA: Diagnosis not present

## 2019-04-09 DIAGNOSIS — M79604 Pain in right leg: Secondary | ICD-10-CM | POA: Diagnosis not present

## 2019-04-15 DIAGNOSIS — Z9181 History of falling: Secondary | ICD-10-CM | POA: Diagnosis not present

## 2019-04-15 DIAGNOSIS — M79604 Pain in right leg: Secondary | ICD-10-CM | POA: Diagnosis not present

## 2019-04-15 DIAGNOSIS — R2681 Unsteadiness on feet: Secondary | ICD-10-CM | POA: Diagnosis not present

## 2019-04-15 DIAGNOSIS — M2681 Anterior soft tissue impingement: Secondary | ICD-10-CM | POA: Diagnosis not present

## 2019-04-15 DIAGNOSIS — M810 Age-related osteoporosis without current pathological fracture: Secondary | ICD-10-CM | POA: Diagnosis not present

## 2019-04-16 ENCOUNTER — Other Ambulatory Visit: Payer: Self-pay

## 2019-04-16 ENCOUNTER — Ambulatory Visit (INDEPENDENT_AMBULATORY_CARE_PROVIDER_SITE_OTHER): Payer: Medicare Other | Admitting: Cardiovascular Disease

## 2019-04-16 ENCOUNTER — Encounter: Payer: Self-pay | Admitting: Cardiovascular Disease

## 2019-04-16 VITALS — BP 122/72 | HR 69 | Temp 96.9°F | Ht 60.0 in | Wt 125.0 lb

## 2019-04-16 DIAGNOSIS — E782 Mixed hyperlipidemia: Secondary | ICD-10-CM

## 2019-04-16 DIAGNOSIS — I10 Essential (primary) hypertension: Secondary | ICD-10-CM

## 2019-04-16 NOTE — Patient Instructions (Addendum)
Medication Instructions:  Your physician recommends that you continue on your current medications as directed. Please refer to the Current Medication list given to you today.  If you need a refill on your cardiac medications before your next appointment, please call your pharmacy.   Lab work: NONE  Testing/Procedures: NONE  Follow-Up: At [facility], you and your health needs are our priority.  As part of our continuing mission to provide you with exceptional heart care, we have created designated Provider Care Teams.  These Care Teams include your primary Cardiologist (physician) and Advanced Practice Providers (APPs -  Physician Assistants and Nurse Practitioners) who all work together to provide you with the care you need, when you need it. You may see Jonathan Berry, MD or one of the following Advanced Practice Providers on your designated Care Team:    Luke Kilroy, PA-C  Callie Goodrich, PA-C  Jesse Cleaver, FNP Your physician wants you to follow-up in 1 year        

## 2019-04-16 NOTE — Progress Notes (Signed)
04/16/2019 [place]   [birth year]  [id number]  Primary Physician Meredith Curry, DO Primary Cardiologist: Meredith Harp MD FACP, [place], [place], [place]  HPI:  [name] is a 85 y.o.  thin appearing widowed Caucasian female mother of 2, grandmother of 2 grandchildren who I last sawin the office  04/23/2018. Her only symptoms at that time were palpitations on a low-dose beta blocker. She also had a history of GERD. She was admitted to [facility] on January 06, 2013 with chest pain. She ruled out for myocardial infarction. She had a Myoview stress test which was normal. She had no recurrent symptoms.she also had TIA type symptoms last year and was diagnosed with a small "cerebral aneurysm "conservative medical therapy was recommended. She follows up with a neurologist and has been asymptomatic. She does have left shoulder issues and apparently since seen Dr. Onnie Stein was recommended total shoulder replacement.Since I saw her a year ago she is remaining clinically stable. She denies chest pain or shortness of breath. [place] 2 years ago at her granddaughter's wedding and wishes to return.  Since I saw her a year ago she is remained stable.  She denies chest pain or shortness of breath.  She underwent uncomplicated inguinal hernia repair by Dr. Redmond Stein back in December of last year.  Current Meds  Medication Sig  . acetaminophen (TYLENOL) 500 MG tablet Take 500 mg by mouth 2 (two) times daily as needed for mild pain or headache.   . bimatoprost (LUMIGAN) 0.01 % SOLN Place 1 drop into both eyes at bedtime.  . Calcium Carbonate-Vitamin D 600-400 MG-UNIT chew tablet Chew 1 tablet by mouth 2 (two) times daily.   . camphor-menthol (SARNA) lotion Apply 1 application topically as needed for itching. Prescribed by Dermatology at [facility]  . Cholecalciferol (VITAMIN D3) 50 MCG (2000 UT) TABS Take by mouth.  . clopidogrel (PLAVIX) 75 MG tablet One tablet daily to  prevent stroke  . CRANBERRY EXTRACT PO Take 1 tablet by mouth daily.  . famotidine (PEPCID AC) 10 MG chewable tablet Chew 10 mg by mouth at bedtime as needed for heartburn.  . hydrocortisone cream 1 % Apply 1 application topically daily as needed for itching.   . metoprolol succinate (TOPROL-XL) 25 MG 24 hr tablet Take 1 tablet (25 mg total) by mouth daily.  . Multiple Vitamins-Minerals (PRESERVISION AREDS 2 PO) Take 1 capsule by mouth 2 (two) times daily.   Vladimir Faster Glycol-Propyl Glycol (SYSTANE ULTRA) 0.4-0.3 % SOLN Place 1 vial into both eyes 2 (two) times daily. May use another dose during the day as needed for dry eye   . psyllium (METAMUCIL) 58.6 % packet Take 1 packet by mouth daily. Mix with water  . vitamin C (ASCORBIC ACID) 500 MG tablet Take 500 mg by mouth daily with supper.      Allergies  Allergen Reactions  . Trimethoprim Other (See Comments)    Headaches/ ear pressure   . Erythromycin Other (See Comments)    UNSPECIFIED REACTION   . Macrodantin [Nitrofurantoin Macrocrystal] Other (See Comments)    UNSPECIFIED REACTION   . Penicillins Other (See Comments)    UNSPECIFIED REACTION  Has patient had a PCN reaction causing immediate rash, facial/tongue/throat swelling, SOB or lightheadedness with hypotension: Unknown Has patient had a PCN reaction causing severe rash involving mucus membranes or skin necrosis: Unknown Has patient had a PCN reaction that required hospitalization: No Has patient had a PCN reaction  occurring within the last 10 years: No If all of the above answers are "NO", then may proceed with Cephalosporin use.  . Sulfa Antibiotics Other (See Comments)    UNSPECIFIED REACTION   . Latex Itching    Social History   Socioeconomic History  . Marital status: Widowed    Spouse name: Not on file  . Number of children: 2  . Years of education: college  . Highest education level: Not on file  Occupational History    Comment: retired  Scientific laboratory technician  .  Financial resource strain: Not hard at all  . Food insecurity    Worry: Never true    Inability: Never true  . Transportation needs    Medical: No    Non-medical: No  Tobacco Use  . Smoking status: Never Smoker  . Smokeless tobacco: Never Used  Substance and Sexual Activity  . Alcohol use: No  . Drug use: No  . Sexual activity: Not Currently  Lifestyle  . Physical activity    Days per week: 3 days    Minutes per session: 30 min  . Stress: Only a little  Relationships  . Social connections    Talks on phone: More than three times a week    Gets together: More than three times a week    Attends religious service: More than 4 times per year    Active member of club or organization: Yes    Attends meetings of clubs or organizations: More than 4 times per year    Relationship status: Widowed  . Intimate partner violence    Fear of current or ex partner: No    Emotionally abused: No    Physically abused: No    Forced sexual activity: No  Other Topics Concern  . Not on file  Social History Narrative   Patient lives at home with her daughter [name]) , moved to [facility] 02/02/16 [place]    Widowed.   Retired.   EducationNurse, mental health.   Right handed.   Caffeine- None some times tea very rare.   Never smoked   Alcohol none     Review of Systems: General: negative for chills, fever, night sweats or weight changes.  Cardiovascular: negative for chest pain, dyspnea on exertion, edema, orthopnea, palpitations, paroxysmal nocturnal dyspnea or shortness of breath Dermatological: negative for rash Respiratory: negative for cough or wheezing Urologic: negative for hematuria Abdominal: negative for nausea, vomiting, diarrhea, bright red blood per rectum, melena, or hematemesis Neurologic: negative for visual changes, syncope, or dizziness All other systems reviewed and are otherwise negative except as noted above.    Blood pressure 122/72, pulse 69, temperature (!) 96.9 F  (36.1 C), height 5' (1.524 m), weight 125 lb (56.7 kg), SpO2 99 %.  General appearance: alert and no distress Neck: no adenopathy, no carotid bruit, no JVD, supple, symmetrical, trachea midline and thyroid not enlarged, symmetric, no tenderness/mass/nodules Lungs: clear to auscultation bilaterally Heart: regular rate and rhythm, S1, S2 normal, no murmur, click, rub or gallop Extremities: extremities normal, atraumatic, no cyanosis or edema Pulses: 2+ and symmetric Skin: Skin color, texture, turgor normal. No rashes or lesions Neurologic: Alert and oriented X 3, normal strength and tone. Normal symmetric reflexes. Normal coordination and gait  EKG sinus rhythm at 70 with small inferior Q waves and early R wave transition.  I personally reviewed this EKG.  ASSESSMENT AND PLAN:   Essential hypertension History of essential hypertension with blood pressure measured today 122/72.  She is on metoprolol.  Hyperlipidemia History of hyperlipidemia not on statin therapy with lipid profile performed 06/12/2017 revealing total cholesterol of 190, LDL of 85 and HDL of 86.      Meredith Harp MD FACP,FACC,FAHA, [facility] 04/16/2019 11:10 AM

## 2019-04-16 NOTE — Assessment & Plan Note (Signed)
History of hyperlipidemia not on statin therapy with lipid profile performed 06/12/2017 revealing total cholesterol of 190, LDL of 85 and HDL of 86.

## 2019-04-16 NOTE — Assessment & Plan Note (Signed)
History of essential hypertension with blood pressure measured today 122/72.  She is on metoprolol.

## 2019-04-17 DIAGNOSIS — Z9181 History of falling: Secondary | ICD-10-CM | POA: Diagnosis not present

## 2019-04-17 DIAGNOSIS — R2681 Unsteadiness on feet: Secondary | ICD-10-CM | POA: Diagnosis not present

## 2019-04-17 DIAGNOSIS — M2681 Anterior soft tissue impingement: Secondary | ICD-10-CM | POA: Diagnosis not present

## 2019-04-17 DIAGNOSIS — M79604 Pain in right leg: Secondary | ICD-10-CM | POA: Diagnosis not present

## 2019-04-17 DIAGNOSIS — M810 Age-related osteoporosis without current pathological fracture: Secondary | ICD-10-CM | POA: Diagnosis not present

## 2019-04-22 DIAGNOSIS — R2681 Unsteadiness on feet: Secondary | ICD-10-CM | POA: Diagnosis not present

## 2019-04-22 DIAGNOSIS — M79604 Pain in right leg: Secondary | ICD-10-CM | POA: Diagnosis not present

## 2019-04-22 DIAGNOSIS — M2681 Anterior soft tissue impingement: Secondary | ICD-10-CM | POA: Diagnosis not present

## 2019-04-22 DIAGNOSIS — M810 Age-related osteoporosis without current pathological fracture: Secondary | ICD-10-CM | POA: Diagnosis not present

## 2019-04-22 DIAGNOSIS — Z9181 History of falling: Secondary | ICD-10-CM | POA: Diagnosis not present

## 2019-04-24 DIAGNOSIS — M79604 Pain in right leg: Secondary | ICD-10-CM | POA: Diagnosis not present

## 2019-04-24 DIAGNOSIS — M810 Age-related osteoporosis without current pathological fracture: Secondary | ICD-10-CM | POA: Diagnosis not present

## 2019-04-24 DIAGNOSIS — R2681 Unsteadiness on feet: Secondary | ICD-10-CM | POA: Diagnosis not present

## 2019-04-24 DIAGNOSIS — M2681 Anterior soft tissue impingement: Secondary | ICD-10-CM | POA: Diagnosis not present

## 2019-04-24 DIAGNOSIS — Z9181 History of falling: Secondary | ICD-10-CM | POA: Diagnosis not present

## 2019-04-29 DIAGNOSIS — M2681 Anterior soft tissue impingement: Secondary | ICD-10-CM | POA: Diagnosis not present

## 2019-04-29 DIAGNOSIS — Z9181 History of falling: Secondary | ICD-10-CM | POA: Diagnosis not present

## 2019-04-29 DIAGNOSIS — R2681 Unsteadiness on feet: Secondary | ICD-10-CM | POA: Diagnosis not present

## 2019-04-29 DIAGNOSIS — M79604 Pain in right leg: Secondary | ICD-10-CM | POA: Diagnosis not present

## 2019-04-29 DIAGNOSIS — M810 Age-related osteoporosis without current pathological fracture: Secondary | ICD-10-CM | POA: Diagnosis not present

## 2019-05-01 DIAGNOSIS — M810 Age-related osteoporosis without current pathological fracture: Secondary | ICD-10-CM | POA: Diagnosis not present

## 2019-05-01 DIAGNOSIS — Z9181 History of falling: Secondary | ICD-10-CM | POA: Diagnosis not present

## 2019-05-01 DIAGNOSIS — M2681 Anterior soft tissue impingement: Secondary | ICD-10-CM | POA: Diagnosis not present

## 2019-05-01 DIAGNOSIS — R2681 Unsteadiness on feet: Secondary | ICD-10-CM | POA: Diagnosis not present

## 2019-05-01 DIAGNOSIS — M79604 Pain in right leg: Secondary | ICD-10-CM | POA: Diagnosis not present

## 2019-05-14 LAB — HM MAMMOGRAPHY

## 2019-05-19 ENCOUNTER — Encounter: Payer: Self-pay | Admitting: *Deleted

## 2019-05-19 DIAGNOSIS — Z23 Encounter for immunization: Secondary | ICD-10-CM | POA: Diagnosis not present

## 2019-05-22 DIAGNOSIS — Z7902 Long term (current) use of antithrombotics/antiplatelets: Secondary | ICD-10-CM | POA: Diagnosis not present

## 2019-05-22 DIAGNOSIS — R1909 Other intra-abdominal and pelvic swelling, mass and lump: Secondary | ICD-10-CM | POA: Diagnosis not present

## 2019-05-22 DIAGNOSIS — I10 Essential (primary) hypertension: Secondary | ICD-10-CM | POA: Diagnosis not present

## 2019-05-22 DIAGNOSIS — K219 Gastro-esophageal reflux disease without esophagitis: Secondary | ICD-10-CM | POA: Diagnosis not present

## 2019-05-22 DIAGNOSIS — Z8719 Personal history of other diseases of the digestive system: Secondary | ICD-10-CM | POA: Diagnosis not present

## 2019-05-26 ENCOUNTER — Other Ambulatory Visit: Payer: Self-pay | Admitting: General Surgery

## 2019-05-26 DIAGNOSIS — R1909 Other intra-abdominal and pelvic swelling, mass and lump: Secondary | ICD-10-CM

## 2019-06-05 ENCOUNTER — Other Ambulatory Visit: Payer: Medicare Other

## 2019-06-09 DIAGNOSIS — H353112 Nonexudative age-related macular degeneration, right eye, intermediate dry stage: Secondary | ICD-10-CM | POA: Diagnosis not present

## 2019-06-09 DIAGNOSIS — H353121 Nonexudative age-related macular degeneration, left eye, early dry stage: Secondary | ICD-10-CM | POA: Diagnosis not present

## 2019-06-09 DIAGNOSIS — Z961 Presence of intraocular lens: Secondary | ICD-10-CM | POA: Diagnosis not present

## 2019-06-09 DIAGNOSIS — H35363 Drusen (degenerative) of macula, bilateral: Secondary | ICD-10-CM | POA: Diagnosis not present

## 2019-06-09 DIAGNOSIS — H35453 Secondary pigmentary degeneration, bilateral: Secondary | ICD-10-CM | POA: Diagnosis not present

## 2019-06-16 DIAGNOSIS — Z23 Encounter for immunization: Secondary | ICD-10-CM | POA: Diagnosis not present

## 2019-06-24 DIAGNOSIS — D1801 Hemangioma of skin and subcutaneous tissue: Secondary | ICD-10-CM | POA: Diagnosis not present

## 2019-06-24 DIAGNOSIS — L814 Other melanin hyperpigmentation: Secondary | ICD-10-CM | POA: Diagnosis not present

## 2019-06-24 DIAGNOSIS — L821 Other seborrheic keratosis: Secondary | ICD-10-CM | POA: Diagnosis not present

## 2019-06-24 DIAGNOSIS — L570 Actinic keratosis: Secondary | ICD-10-CM | POA: Diagnosis not present

## 2019-06-24 DIAGNOSIS — L853 Xerosis cutis: Secondary | ICD-10-CM | POA: Diagnosis not present

## 2019-06-26 ENCOUNTER — Ambulatory Visit
Admission: RE | Admit: 2019-06-26 | Discharge: 2019-06-26 | Disposition: A | Discharge status: Home / Self Care | Payer: Medicare Other | Source: Ambulatory Visit | Attending: General Surgery | Admitting: General Surgery

## 2019-06-26 ENCOUNTER — Other Ambulatory Visit: Payer: Self-pay

## 2019-06-26 DIAGNOSIS — K5730 Diverticulosis of large intestine without perforation or abscess without bleeding: Secondary | ICD-10-CM | POA: Diagnosis not present

## 2019-06-26 DIAGNOSIS — K4090 Unilateral inguinal hernia, without obstruction or gangrene, not specified as recurrent: Secondary | ICD-10-CM | POA: Diagnosis not present

## 2019-06-26 DIAGNOSIS — R1909 Other intra-abdominal and pelvic swelling, mass and lump: Secondary | ICD-10-CM

## 2019-06-26 MED ORDER — IOPAMIDOL (ISOVUE-300) INJECTION 61%
100.0000 mL | Freq: Once | INTRAVENOUS | Status: AC | PRN
  Administered 2019-06-26: 14:00:00 100 mL via INTRAVENOUS

## 2019-07-10 ENCOUNTER — Ambulatory Visit (INDEPENDENT_AMBULATORY_CARE_PROVIDER_SITE_OTHER): Payer: Medicare Other | Admitting: Internal Medicine

## 2019-07-10 ENCOUNTER — Encounter: Payer: Self-pay | Admitting: Internal Medicine

## 2019-07-10 ENCOUNTER — Other Ambulatory Visit: Payer: Self-pay

## 2019-07-10 VITALS — BP 122/74 | HR 58 | Temp 97.1°F | Ht 62.0 in | Wt 128.8 lb

## 2019-07-10 DIAGNOSIS — K219 Gastro-esophageal reflux disease without esophagitis: Secondary | ICD-10-CM

## 2019-07-10 DIAGNOSIS — I872 Venous insufficiency (chronic) (peripheral): Secondary | ICD-10-CM | POA: Diagnosis not present

## 2019-07-10 DIAGNOSIS — K4091 Unilateral inguinal hernia, without obstruction or gangrene, recurrent: Secondary | ICD-10-CM

## 2019-07-10 DIAGNOSIS — E782 Mixed hyperlipidemia: Secondary | ICD-10-CM | POA: Diagnosis not present

## 2019-07-10 DIAGNOSIS — H3530 Unspecified macular degeneration: Secondary | ICD-10-CM

## 2019-07-10 DIAGNOSIS — L602 Onychogryphosis: Secondary | ICD-10-CM | POA: Diagnosis not present

## 2019-07-10 DIAGNOSIS — N3281 Overactive bladder: Secondary | ICD-10-CM | POA: Diagnosis not present

## 2019-07-10 LAB — CBC WITH DIFFERENTIAL/PLATELET
Absolute Monocytes: 619 cells/uL (ref 200–950)
Basophils Absolute: 82 cells/uL (ref 0–200)
Basophils Relative: 1.2 %
Eosinophils Absolute: 360 cells/uL (ref 15–500)
Eosinophils Relative: 5.3 %
HCT: 37.3 % (ref 35.0–45.0)
Hemoglobin: 12.4 g/dL (ref 11.7–15.5)
Lymphs Abs: 1958 cells/uL (ref 850–3900)
MCH: 29.9 pg (ref 27.0–33.0)
MCHC: 33.2 g/dL (ref 32.0–36.0)
MCV: 89.9 fL (ref 80.0–100.0)
MPV: 11 fL (ref 7.5–12.5)
Monocytes Relative: 9.1 %
Neutro Abs: 3781 cells/uL (ref 1500–7800)
Neutrophils Relative %: 55.6 %
Platelets: 229 10*3/uL (ref 140–400)
RBC: 4.15 10*6/uL (ref 3.80–5.10)
RDW: 13.5 % (ref 11.0–15.0)
Total Lymphocyte: 28.8 %
WBC: 6.8 10*3/uL (ref 3.8–10.8)

## 2019-07-10 LAB — COMPLETE METABOLIC PANEL WITH GFR
AG Ratio: 1.9 (calc) (ref 1.0–2.5)
ALT: 13 U/L (ref 6–29)
AST: 18 U/L (ref 10–35)
Albumin: 4.1 g/dL (ref 3.6–5.1)
Alkaline phosphatase (APISO): 56 U/L (ref 37–153)
BUN/Creatinine Ratio: 37 (calc) — ABNORMAL HIGH (ref 6–22)
BUN: 26 mg/dL — ABNORMAL HIGH (ref 7–25)
CO2: 28 mmol/L (ref 20–32)
Calcium: 9.3 mg/dL (ref 8.6–10.4)
Chloride: 103 mmol/L (ref 98–110)
Creat: 0.7 mg/dL (ref 0.60–0.88)
GFR, Est African American: 90 mL/min/{1.73_m2} (ref >=60)
GFR, Est Non African American: 77 mL/min/{1.73_m2} (ref >=60)
Globulin: 2.2 g/dL (calc) (ref 1.9–3.7)
Glucose, Bld: 83 mg/dL (ref 65–99)
Potassium: 4 mmol/L (ref 3.5–5.3)
Sodium: 139 mmol/L (ref 135–146)
Total Bilirubin: 0.4 mg/dL (ref 0.2–1.2)
Total Protein: 6.3 g/dL (ref 6.1–8.1)

## 2019-07-10 LAB — LIPID PANEL
Cholesterol: 188 mg/dL (ref <200)
HDL: 75 mg/dL (ref >=50)
LDL Cholesterol (Calc): 94 mg/dL (calc)
Non-HDL Cholesterol (Calc): 113 mg/dL (calc) (ref <130)
Total CHOL/HDL Ratio: 2.5 (calc) (ref <5.0)
Triglycerides: 96 mg/dL (ref <150)

## 2019-07-10 NOTE — Patient Instructions (Signed)
I agree with avoiding the hernia surgery because it's getting riskier for you.  Let's do the metamucil daily and continue to hydrate well.

## 2019-07-10 NOTE — Progress Notes (Signed)
Location:  [facility] clinic Provider:  Shanyia Stines L. Mariea Clonts, D.O., C.M.D.  Code Status: DNR Goals of Care:  Advanced Directives 03/10/2019  Does Patient Have a Medical Advance Directive? Yes  Type of Advance Directive Out of facility DNR (pink MOST or yellow form)  Does patient want to make changes to medical advance directive? No - Guardian declined  Copy of [place] in Chart? -  Pre-existing out of facility DNR order (yellow form or pink MOST form) Yellow form placed in chart (order not valid for inpatient use)   Chief Complaint  Patient presents with  . Medical Management of Chronic Issues    4 month follow up with labs after appointment , patient would like to discuss inguinal hernia and upcoming surgery, and would like to inform provider of dermatologist and podiatrist     HPI: Patient is a 86 y.o. female seen today for medical management of chronic diseases.    She had her two moderna covid vaccines 1/4 and 2/2 at [facility].    Her hernia has come back again.  She had her surgery.  She's seen Dr. Redmond Pulling twice.  She could feel and see it, but the day she went to him, he could not feel it.  She did ok a while, then it recurred.  She went to him and neither of them could feel it.  She then had a scan which did show the hernia.  He wanted to see her with her daughter.  On the first and second surgery, he did an incision and laparoscopy.  He recommends it be laparoscopic this time and dangers were reviewed.  It continues to come and go.  When it's visible and she can feel it, it hurts some of the time.  She's been so careful about everything related to it.  She uses picker uppers, does not reach to the floor.  She does not lift anything heavy.  She does not have constipation--uses her metamucil.  She's regular with good bms.  Takes it almost every day, but may skip one occasionally.  She did recover real well both times.  Dr. Redmond Pulling is concerned more about bleeding for her  this time because of where it is.  She thinks maybe the day she was getting banana bread out of the fridge and it slipped out of the bag, she moved quickly, but did not bend all the way to the floor.  She hydrates well.  Reviewed CT of abd/pelvis with her. She really does not want to take a chance with the surgery either and says she feels pretty strongly about not doing this.  Her daughter, [name], has also been involved in the decision-making.  She sees him in the office with her daughter 3/10.  Dr. Mallie Mussel comes out to [facility] and trims her toenails.  Putting on socks and shoes is hard w/o putting pressure on her hernia.  She has a little stool near the commode that she uses to help her put on her shoes.  She does have slip-ons.    She is also seeing derm for a couple of places on her scalp near her hairline anteriorly.  She also has a lotion from there that she uses occasionally.  They come to [place] also.  She does have a little trouble going to sleep.  Gets up to take a regular tylenol if she has difficulty.  That helps.  It's not PM tylenol.   She took saffron for her eyes beginning  12/24/18 and stopped it on 01/05/19 due to stomach pain.  She told Dr. Posey Pronto and he was fine with her stopping.  Eyes barely changed when Dr. Kathrin Penner checked them 11/5--right eye was a little different but she does not HAVE to change her Rx unless she wants to.  When she returns, she might change it.  She is seeing either Dr. Kathrin Penner or Dr. Posey Pronto every three months.  Did have to take pepcid yesterday and two days ago b/c her stomach bothered her.  It helped.  She gets one meal per day from [facility] and she gets groceries.  Her daughter helps with that and brings them to her so she gets a variety of things not at [facility].  Past Medical History:  Diagnosis Date  . Cervicalgia   . Chest pain at rest 01/07/2012  . Displacement of cervical intervertebral disc without myelopathy   . Diverticulosis of colon (without mention of hemorrhage)    . Female stress incontinence   . GERD (gastroesophageal reflux disease)    no peds occasional pepcid  . Inguinal hernia without mention of obstruction or gangrene, unilateral or unspecified, (not specified as recurrent)   . Internal hemorrhoids without mention of complication   . Lumbago   . Macular degeneration (senile) of retina, unspecified   . Osteoarthrosis, unspecified whether generalized or localized, unspecified site   . Other and unspecified hyperlipidemia   . Pain in joint, hand   . Palpitations   . Reflux esophagitis   . Scoliosis (and kyphoscoliosis), idiopathic   . Senile osteoporosis   . Skin disorder   . Thyroid disease   . TIA (transient ischemic attack)   . Unruptured popliteal cyst 06/24/2014   Right knee   . Unspecified essential hypertension   . Unspecified glaucoma(365.9)   . Unspecified hypothyroidism   . Unspecified vitamin D deficiency     Past Surgical History:  Procedure Laterality Date  . ABDOMINAL HYSTERECTOMY  1975   Dr Mallie Mussel  . APPENDECTOMY  1988  . cardiolite myocardial perfusion study    . DOPPLER ECHOCARDIOGRAPHY    . EYE SURGERY Bilateral 2009   cataract removed right eye, Dr Charise Killian  . FOOT SURGERY  august 2013   Doran Durand, MD  . INGUINAL HERNIA REPAIR Bilateral    w/mesh  . INGUINAL HERNIA REPAIR Left 08/03/2017   Procedure: LAPAROSCOPIC LEFT INGUINAL HERNIA REPAIR WITH MESH;  Surgeon: Greer Pickerel, MD;  Location: [place];  Service: General;  Laterality: Left;  . INGUINAL HERNIA REPAIR Right 08/03/2017   Procedure: HERNIA REPAIR RIGHT INGUINAL ADULT WITH MESH;  Surgeon: Greer Pickerel, MD;  Location: [place];  Service: General;  Laterality: Right;  . INGUINAL HERNIA REPAIR     Dr. Redmond Pulling 04-24-18  . INGUINAL HERNIA REPAIR Right 04/24/2018   Procedure: DIAGNOSTIC LAPAROSCOPIC, OPENREPAIR OF RECURRENT RIGHT INGUINAL HERNIA WITH MESH ERAS PATHWAY;  Surgeon: Greer Pickerel, MD;  Location: [facility];  Service: General;  Laterality: Right;  . INSERTION OF  MESH Bilateral 08/03/2017   Procedure: INSERTION OF MESH;  Surgeon: Greer Pickerel, MD;  Location: [place];  Service: General;  Laterality: Bilateral;  . NM MYOVIEW LTD     negative  . TONSILLECTOMY  1937  . TOOTH EXTRACTION  09/16/13   Dr Carlos American    Allergies  Allergen Reactions  . Trimethoprim Other (See Comments)    Headaches/ ear pressure   . Erythromycin Other (See Comments)    UNSPECIFIED REACTION   . Macrodantin [Nitrofurantoin Macrocrystal] Other (See Comments)  UNSPECIFIED REACTION   . Penicillins Other (See Comments)    UNSPECIFIED REACTION  Has patient had a PCN reaction causing immediate rash, facial/tongue/throat swelling, SOB or lightheadedness with hypotension: Unknown Has patient had a PCN reaction causing severe rash involving mucus membranes or skin necrosis: Unknown Has patient had a PCN reaction that required hospitalization: No Has patient had a PCN reaction occurring within the last 10 years: No If all of the above answers are "NO", then may proceed with Cephalosporin use.  . Sulfa Antibiotics Other (See Comments)    UNSPECIFIED REACTION   . Latex Itching    Outpatient Encounter Medications as of 07/10/2019  Medication Sig  . acetaminophen (TYLENOL) 500 MG tablet Take 500 mg by mouth 2 (two) times daily as needed for mild pain or headache.   . bimatoprost (LUMIGAN) 0.01 % SOLN Place 1 drop into both eyes at bedtime.  . Calcium Carbonate-Vitamin D 600-400 MG-UNIT chew tablet Chew 1 tablet by mouth 2 (two) times daily.   . camphor-menthol (SARNA) lotion Apply 1 application topically as needed for itching. Prescribed by Dermatology at [facility]  . Cholecalciferol (VITAMIN D3) 50 MCG (2000 UT) TABS Take by mouth.  . clopidogrel (PLAVIX) 75 MG tablet One tablet daily to prevent stroke  . CRANBERRY EXTRACT PO Take 1 tablet by mouth daily.  . famotidine (PEPCID AC) 10 MG chewable tablet Chew 10 mg by mouth at bedtime as needed for heartburn.  . hydrocortisone cream  1 % Apply 1 application topically daily as needed for itching.   . metoprolol succinate (TOPROL-XL) 25 MG 24 hr tablet Take 1 tablet (25 mg total) by mouth daily.  . Multiple Vitamins-Minerals (PRESERVISION AREDS 2 PO) Take 1 capsule by mouth 2 (two) times daily.   Vladimir Faster Glycol-Propyl Glycol (SYSTANE ULTRA) 0.4-0.3 % SOLN Place 1 vial into both eyes 2 (two) times daily. May use another dose during the day as needed for dry eye   . psyllium (METAMUCIL) 58.6 % packet Take 1 packet by mouth daily. Mix with water  . vitamin C (ASCORBIC ACID) 500 MG tablet Take 500 mg by mouth daily with supper.    No facility-administered encounter medications on file as of 07/10/2019.    Review of Systems:  Review of Systems  Constitutional: Negative for chills, fever and malaise/fatigue.  HENT: Negative for congestion and sore throat.   Eyes: Negative for blurred vision.       Glasses, still drives  Respiratory: Negative for cough and shortness of breath.   Cardiovascular: Negative for chest pain, palpitations and leg swelling.  Gastrointestinal: Positive for abdominal pain. Negative for blood in stool, constipation, diarrhea, heartburn and melena.       Has hemorrhoids, right inguinal hernia, CT had large amount of stool  Genitourinary: Negative for dysuria.  Musculoskeletal: Negative for falls, joint pain and myalgias.  Skin: Negative for itching and rash.  Neurological: Negative for dizziness and loss of consciousness.  Endo/Heme/Allergies: Does not bruise/bleed easily.  Psychiatric/Behavioral: Negative for depression and memory loss. The patient is not nervous/anxious and does not have insomnia.     Health Maintenance  Topic Date Due  . TETANUS/TDAP  03/20/2020  . MAMMOGRAM  05/13/2020  . INFLUENZA VACCINE  Completed  . DEXA SCAN  Completed  . PNA vac Low Risk Adult  Completed    Physical Exam: Vitals:   07/10/19 0818  BP: 122/74  Pulse: (!) 58  Temp: (!) 97.1 F (36.2 C)  TempSrc:  Temporal  SpO2:  98%  Weight: 128 lb 12.8 oz (58.4 kg)  Height: 5\' 2"  (1.575 m)   Body mass index is 23.56 kg/m. Physical Exam Vitals reviewed.  Constitutional:      General: She is not in acute distress.    Appearance: Normal appearance. She is not toxic-appearing.  HENT:     Head: Normocephalic and atraumatic.  Cardiovascular:     Rate and Rhythm: Normal rate and regular rhythm.     Heart sounds: Murmur present.  Pulmonary:     Effort: Pulmonary effort is normal.     Breath sounds: Normal breath sounds. No wheezing, rhonchi or rales.  Abdominal:     General: Bowel sounds are normal. There is no distension.     Palpations: Abdomen is soft. There is no mass.     Tenderness: There is no abdominal tenderness. There is no guarding or rebound.     Hernia: A hernia is present.     Comments: Right inguinal hernia (a bit more superolateral than previously and closer to incision site)  Musculoskeletal:        General: Normal range of motion.     Comments: Uses single point cane  Skin:    General: Skin is warm and dry.  Neurological:     General: No focal deficit present.     Mental Status: She is alert and oriented to person, place, and time. Mental status is at baseline.  Psychiatric:        Mood and Affect: Mood normal.     Labs reviewed: Basic Metabolic Panel: Recent Labs    10/28/18 0000  NA 140  K 3.8  CL 102  CO2 30  GLUCOSE 76  BUN 20  CREATININE 0.65  CALCIUM 9.6   Liver Function Tests: Recent Labs    10/28/18 0000  AST 18  ALT 13  BILITOT 0.4  PROT 6.5   No results for input(s): LIPASE, AMYLASE in the last 8760 hours. No results for input(s): AMMONIA in the last 8760 hours. CBC: Recent Labs    10/28/18 0000  WBC 6.7  NEUTROABS 2,854  HGB 13.2  HCT 40.3  MCV 93.5  PLT 228   Lipid Panel: No results for input(s): CHOL, HDL, LDLCALC, TRIG, CHOLHDL, LDLDIRECT in the last 8760 hours. Lab Results  Component Value Date   HGBA1C 5.3 05/02/2013     Procedures since last visit: CT PELVIS W CONTRAST  Result Date: 06/26/2019 CLINICAL DATA:  87 year old with 3 current right groin swelling. History of bilateral inguinal hernia repair. EXAM: CT PELVIS WITH CONTRAST TECHNIQUE: Multidetector CT imaging of the pelvis was performed using the standard protocol following the bolus administration of intravenous contrast. CONTRAST:  133mL ISOVUE-300 IOPAMIDOL (ISOVUE-300) INJECTION 61% COMPARISON:  CT 06/08/2017 FINDINGS: Urinary Tract: Right kidney partially included with stable prominence of the renal collecting system. Urinary bladder is partially distended. No definite bladder wall thickening. Bowel: Small wide neck right inguinal hernia contains portions of the cecum. No associated wall thickening. Pelvic small bowel is unremarkable. Moderate volume of colonic stool. Mild diverticulosis without diverticulitis. Previous left inguinal hernia is no longer seen. Vascular/Lymphatic: Aorto bi-iliac atherosclerosis. No inguinal or pelvic adenopathy. Reproductive:  Uterus surgically absent. No adnexal mass. Other: Recurrent right inguinal hernia contains portions of the cecum. Mild adjacent soft tissue thickening in the upper inguinal canal. Previous left inguinal hernia is no longer seen. There is no pelvic free fluid. Musculoskeletal: Chronic degenerative change of the pubic symphysis. Degenerative change in the included lumbar spine.  No focal bone lesion IMPRESSION: 1. Recurrent right inguinal hernia containing portions of the cecum. No associated bowel wall thickening or obstruction. 2. No recurrent left inguinal hernia. 3. Colonic diverticulosis.  Large stool burden. Aortic Atherosclerosis (ICD10-I70.0). Electronically Signed   By: Keith Rake M.D.   On: 06/26/2019 20:15    Assessment/Plan 1. Recurrent right inguinal hernia -discussed palliative approach to this hernia which will include daily metamucil, continued hydration, careful interventions to  prevent progression (using "picker uppers") and perhaps even getting a new device from therapy to help with her socks which are her biggest challenge  -she will keep her appt with Dr. Redmond Pulling for her and her daughter to meet with him--will cc him on note - CBC with Differential/Platelet - COMPLETE METABOLIC PANEL WITH GFR  2. Overgrown toenails -getting trimmed now by podiatry on site at [facility] (Dr. Mallie Mussel from [facility])  3. Overactive bladder -did not c/o this much today--stable, goes frequently and uses pads  4. Macular degeneration of both eyes, unspecified type -has been stable, followed closely by retina specialist and general ophtho q 3 months alternating  5. Chronic venous insufficiency - stable, elevate feet at rest, currently no edema - COMPLETE METABOLIC PANEL WITH GFR  6. Mixed hyperlipidemia -at her advanced age, I don't see a reason for statin therapy; she is taking metamucil which may help lower her cholesterol a little; she's gradually getting frail and I don't want to potentially worsen that tradjectory with another pill - Lipid panel  7. Gastroesophageal reflux disease without esophagitis -stable with occasional pepcid--had a bout yesterday and day before but better today after using the pepcid yesterday - COMPLETE METABOLIC PANEL WITH GFR  45 minutes were spent with patient reviewing prior notes from Dr. Redmond Pulling, CT abd/pelvis and discussing management of her multiple chronic conditions  Labs/tests ordered:   Lab Orders     CBC with Differential/Platelet     COMPLETE METABOLIC PANEL WITH GFR     Lipid panel  Next appt:  10/20/2019   Crystin Lechtenberg L. Hermie Reagor, D.O. [facility] [address], [place] [address] Cell Phone (Mon-Fri 8am-5pm):  [phone] On Call:  [phone] & follow prompts after 5pm & weekends Office Phone:  [phone] Office Fax:  [phone]

## 2019-07-22 DIAGNOSIS — L814 Other melanin hyperpigmentation: Secondary | ICD-10-CM | POA: Diagnosis not present

## 2019-07-22 DIAGNOSIS — D1801 Hemangioma of skin and subcutaneous tissue: Secondary | ICD-10-CM | POA: Diagnosis not present

## 2019-07-22 DIAGNOSIS — L853 Xerosis cutis: Secondary | ICD-10-CM | POA: Diagnosis not present

## 2019-07-22 DIAGNOSIS — L570 Actinic keratosis: Secondary | ICD-10-CM | POA: Diagnosis not present

## 2019-07-22 DIAGNOSIS — L821 Other seborrheic keratosis: Secondary | ICD-10-CM | POA: Diagnosis not present

## 2019-07-23 DIAGNOSIS — K4091 Unilateral inguinal hernia, without obstruction or gangrene, recurrent: Secondary | ICD-10-CM | POA: Diagnosis not present

## 2019-07-23 DIAGNOSIS — Z7902 Long term (current) use of antithrombotics/antiplatelets: Secondary | ICD-10-CM | POA: Diagnosis not present

## 2019-07-23 DIAGNOSIS — I10 Essential (primary) hypertension: Secondary | ICD-10-CM | POA: Diagnosis not present

## 2019-08-04 ENCOUNTER — Other Ambulatory Visit: Payer: Self-pay | Admitting: Internal Medicine

## 2019-08-04 DIAGNOSIS — I10 Essential (primary) hypertension: Secondary | ICD-10-CM

## 2019-08-13 IMAGING — CT CT ABD-PELV W/ CM
2 of 5 series · 16 of 46 positions shown, 18 images · IV contrast (APPLIED)
Comparison: 03/15/2016

CLINICAL DATA: Hernia pain in the pelvis x1 day.

EXAM:
CT ABDOMEN AND PELVIS WITH CONTRAST
TECHNIQUE: Multidetector CT imaging of the abdomen and pelvis was performed
using the standard protocol following bolus administration of
intravenous contrast.
CONTRAST:  100 cc 7JD4FF-HWW IOPAMIDOL (7JD4FF-HWW) INJECTION 61%

[Series 3: abd/ pelvis 5.0 i30f 2 · axial · 0.71mm/px · z∈[-475,-135]mm · 13 of 78 slices shown, 15 images]
[im 5/78  soft-tissue]
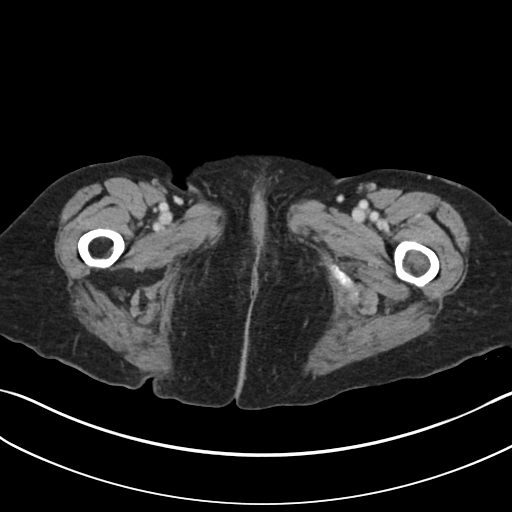
[im 5/78  bone]
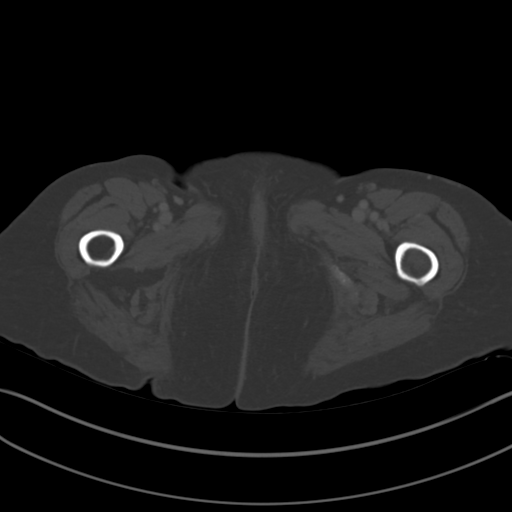
[im 10/78  soft-tissue]
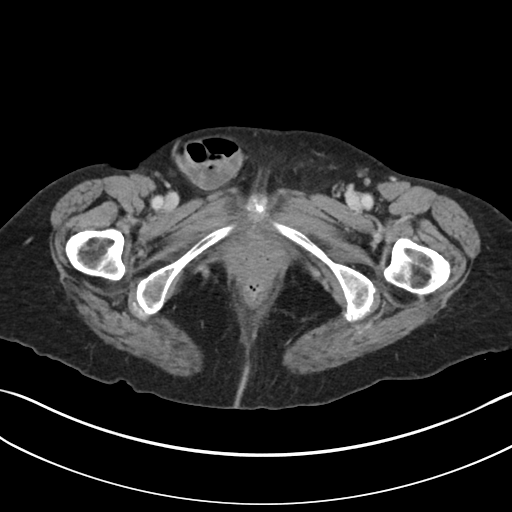
[im 15/78  soft-tissue]
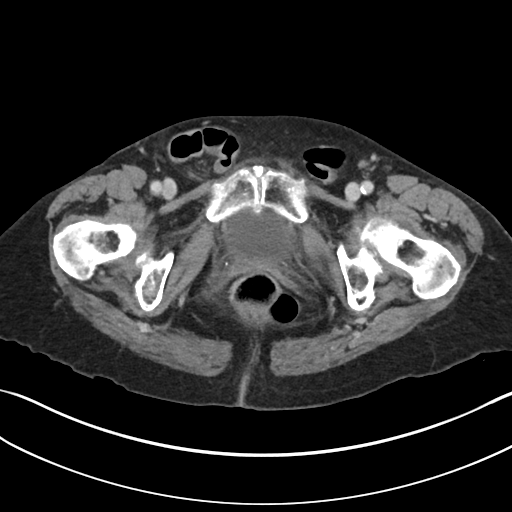
[im 25/78  soft-tissue]
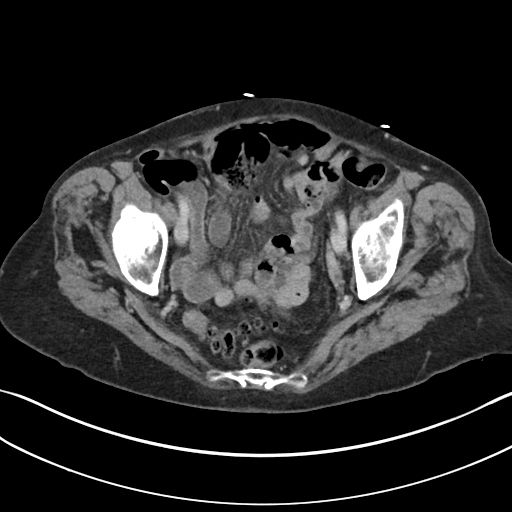
[im 29/78  soft-tissue]
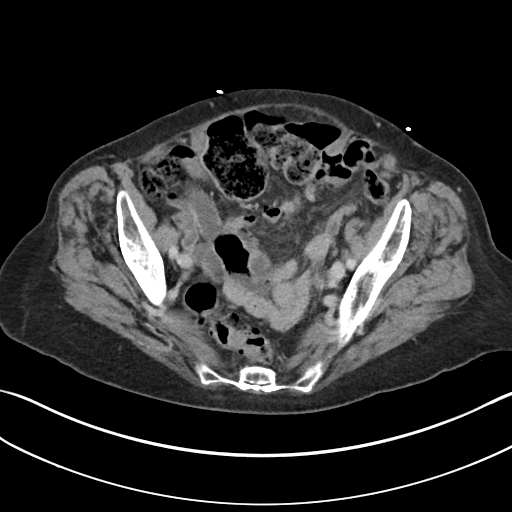
[im 34/78  soft-tissue]
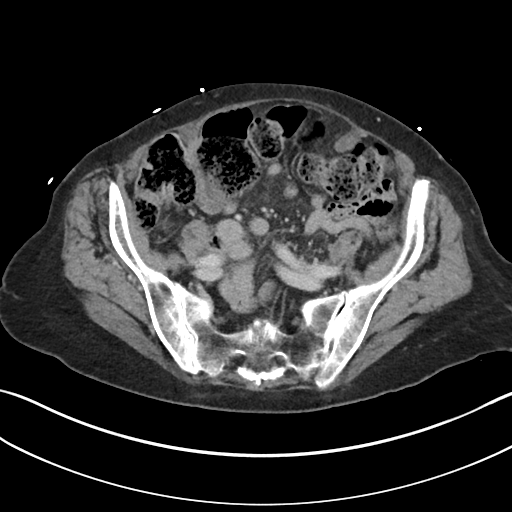
[im 39/78  soft-tissue]
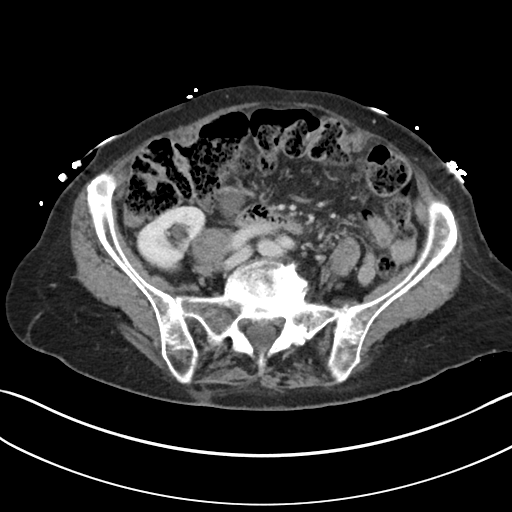
[im 44/78  soft-tissue]
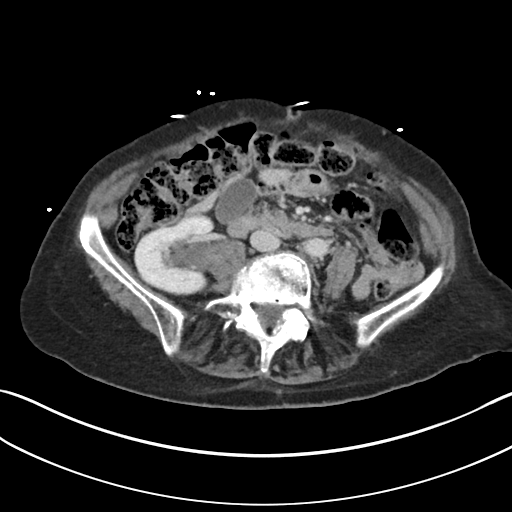
[im 49/78  soft-tissue]
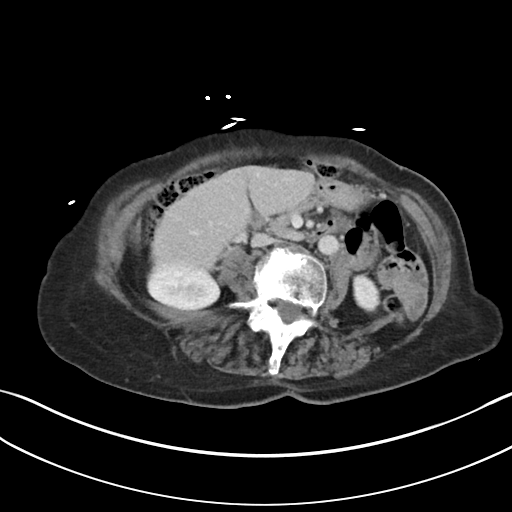
[im 49/78  bone]
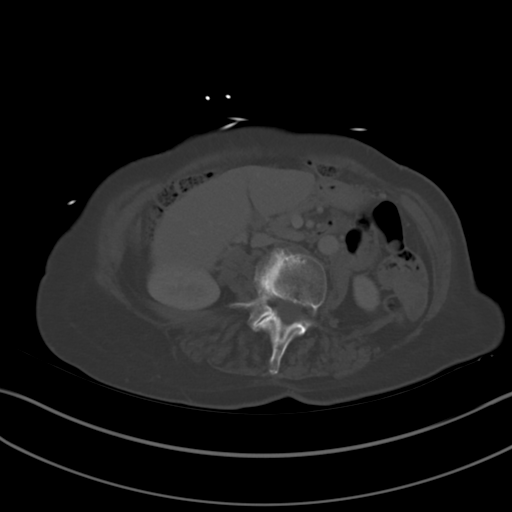
[im 53/78  soft-tissue]
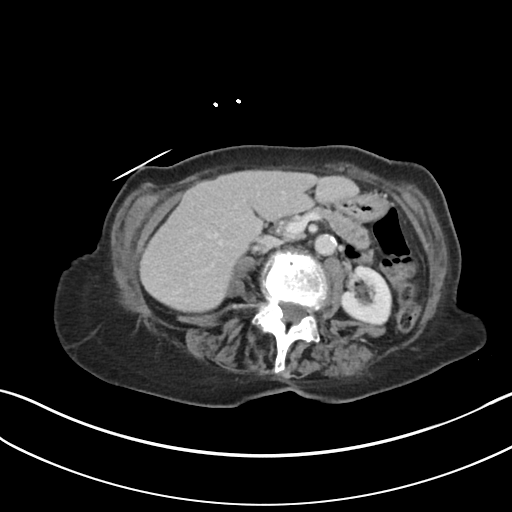
[im 63/78  soft-tissue]
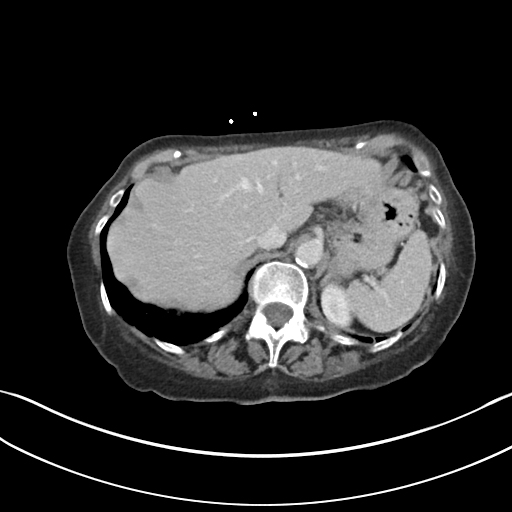
[im 68/78  soft-tissue]
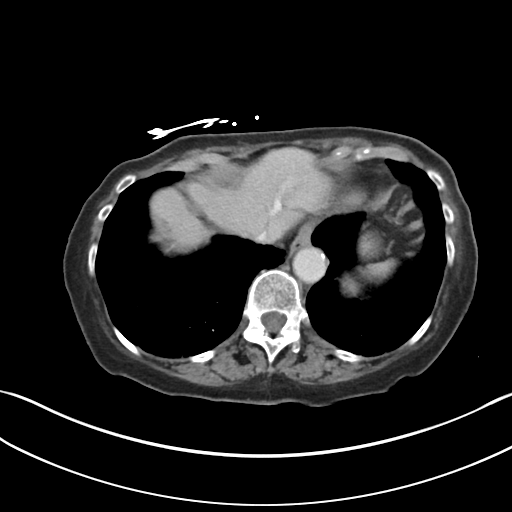
[im 73/78  soft-tissue]
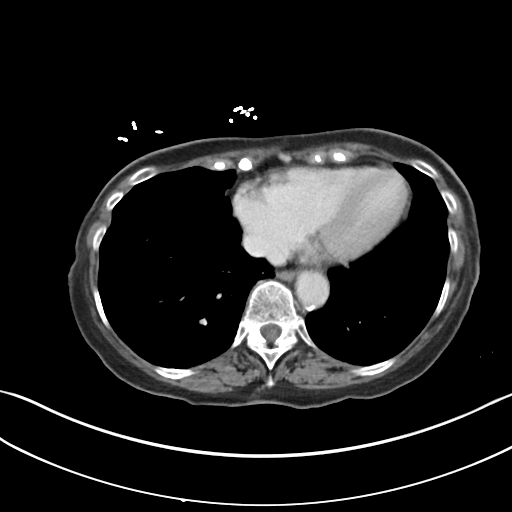

[Series 6: coronal soft tissue · coronal · 0.65mm/px · 3 of 76 slices shown]
[im 26/76  soft-tissue]
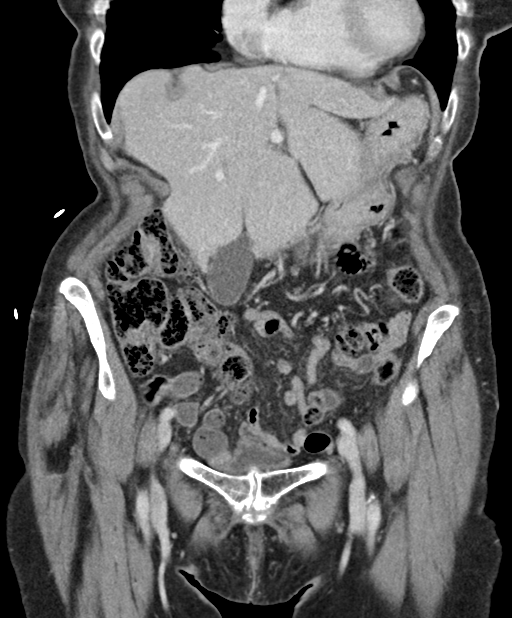
[im 34/76  soft-tissue]
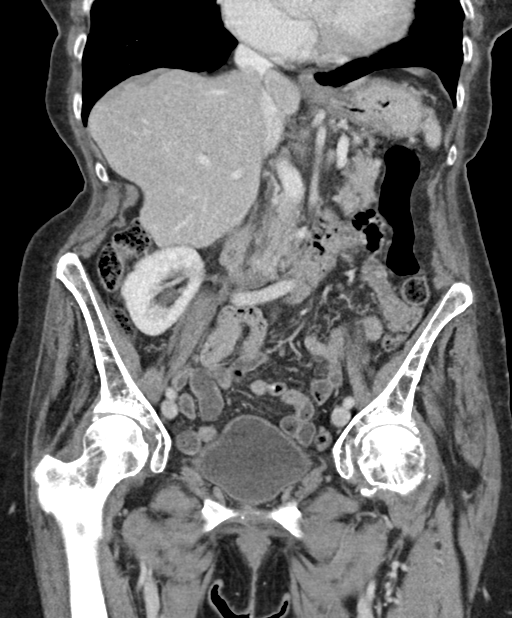
[im 42/76  soft-tissue]
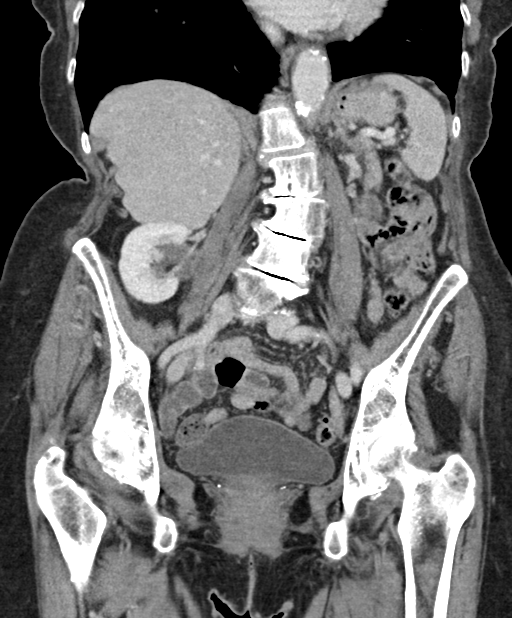

[16 of 46 positions shown; findings below may reference images not displayed]

FINDINGS: Lower chest: Stable cardiomegaly without pericardial effusion.
Bibasilar dependent atelectasis. No

Hepatobiliary: Nondistended gallbladder without stones. No
space-occupying mass of the liver. No biliary dilatation.

Pancreas: Normal

Spleen: Normal

Adrenals/Urinary Tract: Normal bilateral adrenal glands. Stable
appearance of the kidneys with right pelvic kidney redemonstrated.
Chronic ectasia of the renal collecting systems moderate on the
right and mild on the left. No nephrolithiasis, enhancing renal mass
nor hydroureter. Urinary bladder is physiologically distended..

Stomach/Bowel: With bilateral inguinal hernias are demonstrated, on
the right containing small bowel and on the left portions of the
distal descending and proximal sigmoid. Moderate fecal retention is
noted. No significant small nor large bowel dilatation is seen.
There is colonic diverticulosis along the sigmoid colon without
acute diverticulitis. Contracted stomach. There is normal small
bowel rotation. Status post appendectomy.

Vascular/Lymphatic: Mild aortoiliac and branch vessel
atherosclerosis. No aneurysm. No adenopathy.

Reproductive: Hysterectomy.  No adnexal mass.

Other: No free air nor free fluid.

Musculoskeletal: Multilevel degenerative disc disease L2 through S1
with associated facet arthropathy and levoconvex curvature of the
lumbar spine consistent with spondylosis.
IMPRESSION: 1. Chronic small bowel containing right inguinal hernia with new
large bowel containing left inguinal hernia. No obstruction is
noted. A large amount of fecal retention is seen within the colon.
No inflammatory change is noted. No definite evidence of
incarceration.
2. Stable cardiomegaly.  No active pulmonary disease.
3. Low lying right pelvic kidney with chronic bilateral renal
collecting system ectasia. No nephrolithiasis nor obstructive
uropathy.
4. Sigmoid diverticulosis without acute diverticulitis.
5. Status post appendectomy and hysterectomy.

## 2019-08-27 DIAGNOSIS — M5416 Radiculopathy, lumbar region: Secondary | ICD-10-CM | POA: Diagnosis not present

## 2019-08-27 DIAGNOSIS — M25552 Pain in left hip: Secondary | ICD-10-CM | POA: Diagnosis not present

## 2019-08-28 ENCOUNTER — Ambulatory Visit (INDEPENDENT_AMBULATORY_CARE_PROVIDER_SITE_OTHER): Payer: Medicare Other | Admitting: Otolaryngology

## 2019-08-28 DIAGNOSIS — L814 Other melanin hyperpigmentation: Secondary | ICD-10-CM | POA: Diagnosis not present

## 2019-08-28 DIAGNOSIS — L570 Actinic keratosis: Secondary | ICD-10-CM | POA: Diagnosis not present

## 2019-08-28 DIAGNOSIS — L821 Other seborrheic keratosis: Secondary | ICD-10-CM | POA: Diagnosis not present

## 2019-08-28 DIAGNOSIS — L853 Xerosis cutis: Secondary | ICD-10-CM | POA: Diagnosis not present

## 2019-08-28 DIAGNOSIS — D1801 Hemangioma of skin and subcutaneous tissue: Secondary | ICD-10-CM | POA: Diagnosis not present

## 2019-08-29 ENCOUNTER — Ambulatory Visit (INDEPENDENT_AMBULATORY_CARE_PROVIDER_SITE_OTHER): Payer: Medicare Other | Admitting: Otolaryngology

## 2019-08-29 ENCOUNTER — Other Ambulatory Visit: Payer: Self-pay

## 2019-08-29 VITALS — Temp 97.3°F

## 2019-08-29 DIAGNOSIS — H6123 Impacted cerumen, bilateral: Secondary | ICD-10-CM

## 2019-08-29 NOTE — Progress Notes (Signed)
HPI: [name] is a 86 y.o. female who presents for evaluation of wax buildup in her ears.  She has slight decreased hearing..  Past Medical History:  Diagnosis Date  . Cervicalgia   . Chest pain at rest 01/07/2012  . Displacement of cervical intervertebral disc without myelopathy   . Diverticulosis of colon (without mention of hemorrhage)   . Female stress incontinence   . GERD (gastroesophageal reflux disease)    no peds occasional pepcid  . Inguinal hernia without mention of obstruction or gangrene, unilateral or unspecified, (not specified as recurrent)   . Internal hemorrhoids without mention of complication   . Lumbago   . Macular degeneration (senile) of retina, unspecified   . Osteoarthrosis, unspecified whether generalized or localized, unspecified site   . Other and unspecified hyperlipidemia   . Pain in joint, hand   . Palpitations   . Reflux esophagitis   . Scoliosis (and kyphoscoliosis), idiopathic   . Senile osteoporosis   . Skin disorder   . Thyroid disease   . TIA (transient ischemic attack)   . Unruptured popliteal cyst 06/24/2014   Right knee   . Unspecified essential hypertension   . Unspecified glaucoma(365.9)   . Unspecified hypothyroidism   . Unspecified vitamin D deficiency    Past Surgical History:  Procedure Laterality Date  . ABDOMINAL HYSTERECTOMY  1975   Dr Mallie Mussel  . APPENDECTOMY  1988  . cardiolite myocardial perfusion study    . DOPPLER ECHOCARDIOGRAPHY    . EYE SURGERY Bilateral 2009   cataract removed right eye, Dr Charise Killian  . FOOT SURGERY  august 2013   Doran Durand, MD  . INGUINAL HERNIA REPAIR Bilateral    w/mesh  . INGUINAL HERNIA REPAIR Left 08/03/2017   Procedure: LAPAROSCOPIC LEFT INGUINAL HERNIA REPAIR WITH MESH;  Surgeon: Greer Pickerel, MD;  Location: [place];  Service: General;  Laterality: Left;  . INGUINAL HERNIA REPAIR Right 08/03/2017   Procedure: HERNIA REPAIR RIGHT INGUINAL ADULT WITH MESH;  Surgeon: Greer Pickerel, MD;   Location: [place];  Service: General;  Laterality: Right;  . INGUINAL HERNIA REPAIR     Dr. Redmond Pulling 04-24-18  . INGUINAL HERNIA REPAIR Right 04/24/2018   Procedure: DIAGNOSTIC LAPAROSCOPIC, OPENREPAIR OF RECURRENT RIGHT INGUINAL HERNIA WITH MESH ERAS PATHWAY;  Surgeon: Greer Pickerel, MD;  Location: [facility];  Service: General;  Laterality: Right;  . INSERTION OF MESH Bilateral 08/03/2017   Procedure: INSERTION OF MESH;  Surgeon: Greer Pickerel, MD;  Location: [place];  Service: General;  Laterality: Bilateral;  . NM MYOVIEW LTD     negative  . TONSILLECTOMY  1937  . TOOTH EXTRACTION  09/16/13   Dr Carlos American   Social History   Socioeconomic History  . Marital status: Widowed    Spouse name: Not on file  . Number of children: 2  . Years of education: college  . Highest education level: Not on file  Occupational History    Comment: retired  Tobacco Use  . Smoking status: Never Smoker  . Smokeless tobacco: Never Used  Substance and Sexual Activity  . Alcohol use: No  . Drug use: No  . Sexual activity: Not Currently  Other Topics Concern  . Not on file  Social History Narrative   Patient lives at home with her daughter [name]) , moved to [facility] 02/02/16 [place]    Widowed.   Retired.   EducationNurse, mental health.   Right handed.   Caffeine- None some times tea very rare.  Never smoked   Alcohol none   Social Determinants of Health   Financial Resource Strain:   . Difficulty of Paying Living Expenses:   Food Insecurity:   . Worried About Charity fundraiser in the Last Year:   . Arboriculturist in the Last Year:   Transportation Needs:   . Film/video editor (Medical):   Marland Kitchen Lack of Transportation (Non-Medical):   Physical Activity:   . Days of Exercise per Week:   . Minutes of Exercise per Session:   Stress:   . Feeling of Stress :   Social Connections:   . Frequency of Communication with Friends and Family:   . Frequency of Social Gatherings with Friends and Family:   .  Attends Religious Services:   . Active Member of Clubs or Organizations:   . Attends Archivist Meetings:   Marland Kitchen Marital Status:    Family History  Problem Relation Age of Onset  . CAD Mother   . Parkinson's disease Mother   . Cancer Father        colon cancer  . Parkinson's disease Father   . Stroke Maternal Grandmother    Allergies  Allergen Reactions  . Trimethoprim Other (See Comments)    Headaches/ ear pressure   . Erythromycin Other (See Comments)    UNSPECIFIED REACTION   . Macrodantin [Nitrofurantoin Macrocrystal] Other (See Comments)    UNSPECIFIED REACTION   . Penicillins Other (See Comments)    UNSPECIFIED REACTION  Has patient had a PCN reaction causing immediate rash, facial/tongue/throat swelling, SOB or lightheadedness with hypotension: Unknown Has patient had a PCN reaction causing severe rash involving mucus membranes or skin necrosis: Unknown Has patient had a PCN reaction that required hospitalization: No Has patient had a PCN reaction occurring within the last 10 years: No If all of the above answers are "NO", then may proceed with Cephalosporin use.  . Sulfa Antibiotics Other (See Comments)    UNSPECIFIED REACTION   . Latex Itching   Prior to Admission medications   Medication Sig Start Date End Date Taking? Authorizing Provider  acetaminophen (TYLENOL) 500 MG tablet Take 500 mg by mouth 2 (two) times daily as needed for mild pain or headache.    Yes [provider]  bimatoprost (LUMIGAN) 0.01 % SOLN Place 1 drop into both eyes at bedtime.   Yes [provider]  Calcium Carbonate-Vitamin D 600-400 MG-UNIT chew tablet Chew 1 tablet by mouth 2 (two) times daily.    Yes [provider]  camphor-menthol Timoteo Ace) lotion Apply 1 application topically as needed for itching. Prescribed by Dermatology at [facility]   Yes [provider]  Cholecalciferol (VITAMIN D3) 50 MCG (2000 UT) TABS Take by mouth.   Yes [provider]  clopidogrel (PLAVIX) 75 MG tablet One tablet daily to prevent stroke 03/10/19  Yes Reed, Tiffany L, DO  CRANBERRY EXTRACT PO Take 1 tablet by mouth daily.   Yes [provider]  famotidine (PEPCID AC) 10 MG chewable tablet Chew 10 mg by mouth at bedtime as needed for heartburn.   Yes [provider]  hydrocortisone cream 1 % Apply 1 application topically daily as needed for itching.    Yes [provider]  metoprolol succinate (TOPROL-XL) 25 MG 24 hr tablet TAKE 1 TABLET ONCE DAILY TO REGULATE HEART AND CONTROL BLOOD PRESSURE. 08/04/19  Yes Reed, Tiffany L, DO  Multiple Vitamins-Minerals (PRESERVISION AREDS 2 PO) Take 1 capsule by mouth  2 (two) times daily.    Yes [provider]  Polyethyl Glycol-Propyl Glycol (SYSTANE ULTRA) 0.4-0.3 % SOLN Place 1 vial into both eyes 2 (two) times daily. May use another dose during the day as needed for dry eye    Yes [provider]  psyllium (METAMUCIL) 58.6 % packet Take 1 packet by mouth daily. Mix with water   Yes [provider]  vitamin C (ASCORBIC ACID) 500 MG tablet Take 500 mg by mouth daily with supper.    Yes [provider]     Positive ROS: Otherwise negative  All other systems have been reviewed and were otherwise negative with the exception of those mentioned in the HPI and as above.  Physical Exam: Constitutional: Alert, well-appearing, no acute distress Ears: External ears without lesions or tenderness. Ear canals moderate mount of wax buildup in both ear canals that was cleaned with forceps and curette.  TMs were clear bilaterally.. Nasal: External nose without lesions. Clear nasal passages Oral: Oropharynx clear. Neck: No palpable adenopathy or masses Respiratory: Breathing comfortably  Skin: No facial/neck lesions or rash noted.  Cerumen impaction removal  Date/Time: 08/29/2019 6:04 PM Performed by: Rozetta Nunnery, MD Authorized by: Rozetta Nunnery, MD   Consent:    Consent obtained:  Verbal   Consent given by:  Patient   Risks discussed:  Pain and bleeding Procedure details:    Location:  L ear and R ear   Procedure type: curette and forceps   Post-procedure details:    Inspection:  TM intact and canal normal   Hearing quality:  Improved   Patient tolerance of procedure:  Tolerated well, no immediate complications Comments:     TMs are clear bilaterally.    Assessment: Bilateral cerumen buildup  Plan: She will follow-up as needed  Radene Journey, MD

## 2019-09-04 DIAGNOSIS — M545 Low back pain: Secondary | ICD-10-CM | POA: Diagnosis not present

## 2019-09-15 ENCOUNTER — Ambulatory Visit: Payer: Medicare Other | Admitting: Family

## 2019-09-16 ENCOUNTER — Encounter: Payer: Self-pay | Admitting: Nurse Practitioner

## 2019-09-16 ENCOUNTER — Non-Acute Institutional Stay: Payer: Medicare Other | Admitting: Nurse Practitioner

## 2019-09-16 ENCOUNTER — Other Ambulatory Visit: Payer: Self-pay

## 2019-09-16 DIAGNOSIS — K4090 Unilateral inguinal hernia, without obstruction or gangrene, not specified as recurrent: Secondary | ICD-10-CM | POA: Diagnosis not present

## 2019-09-16 DIAGNOSIS — M79605 Pain in left leg: Secondary | ICD-10-CM | POA: Diagnosis not present

## 2019-09-16 DIAGNOSIS — M545 Low back pain, unspecified: Secondary | ICD-10-CM

## 2019-09-16 DIAGNOSIS — I872 Venous insufficiency (chronic) (peripheral): Secondary | ICD-10-CM | POA: Diagnosis not present

## 2019-09-16 DIAGNOSIS — I10 Essential (primary) hypertension: Secondary | ICD-10-CM

## 2019-09-16 MED ORDER — POTASSIUM CHLORIDE CRYS ER 20 MEQ PO TBCR: 20.0000 meq | EXTENDED_RELEASE_TABLET | Freq: Every day | ORAL | 3 refills | Status: DC

## 2019-09-16 MED ORDER — FUROSEMIDE 20 MG PO TABS: 20.0000 mg | ORAL_TABLET | Freq: Every day | ORAL | 3 refills | Status: DC

## 2019-09-16 NOTE — Assessment & Plan Note (Addendum)
apparently swelling in BLE, the patient denied cough, SOB, PND, phlegm production, chest pain/pressure, or palpitation. May consider echocardiogram if s/s of CHF develops or f/u Cardiology, will trial Furosemide 87m qd, Kcl 120m qd, update CMP/eGFR 2 weeks. Will apply TED, elevate legs as much as possible.

## 2019-09-16 NOTE — Assessment & Plan Note (Addendum)
CT pelvis w CM for right groin swelling 06/26/19 identified right inguinal hernia. Avoid constipation or activities may increase intraabdominal pressure.  S/p surgical repair x2

## 2019-09-16 NOTE — Patient Instructions (Addendum)
Elevate legs as much as possible throughout day, starts Furosemide 20mg  along with potassium 1meq daily, follow up lab in 2 weeks. Apply knee high mid weight compression hosiery on am and off at pm. F/u Dr. Lyndel Safe in 2-3 weeks.

## 2019-09-16 NOTE — Assessment & Plan Note (Signed)
Blood pressure is controlled, continue Metoprolol. 

## 2019-09-16 NOTE — Assessment & Plan Note (Signed)
Flare up 09/24/19, saw Ortho, working with therapy.

## 2019-09-16 NOTE — Progress Notes (Signed)
Location:   [facility] of Service:  Clinic (12) Provider: Marlana Latus NP  Code Status: DNR Goals of Care: [place] 07/10/2019  Does Patient Have a Medical Advance Directive? Yes  Type of Paramedic of [place];Living will;Out of facility DNR (pink MOST or yellow form)  Does patient want to make changes to medical advance directive? No - Patient declined  Copy of [place] in Chart? Yes - validated most recent copy scanned in chart (See row information)  Pre-existing out of facility DNR order (yellow form or pink MOST form) Yellow form placed in chart (order not valid for inpatient use)     Chief Complaint  Patient presents with  . Acute Visit    April 12th left foot started swelling.    HPI: Patient is a 86 y.o. female seen today for an acute visit for  Hx of HTN, blood pressure is controlled on Metoprolol 25m . Hx of PVD, chronic edema BLE. CT pelvis w CM for right groin swelling 06/26/19 identified right inguinal hernia.   Past Medical History:  Diagnosis Date  . Cervicalgia   . Chest pain at rest 01/07/2012  . Displacement of cervical intervertebral disc without myelopathy   . Diverticulosis of colon (without mention of hemorrhage)   . Female stress incontinence   . GERD (gastroesophageal reflux disease)    no peds occasional pepcid  . Inguinal hernia without mention of obstruction or gangrene, unilateral or unspecified, (not specified as recurrent)   . Internal hemorrhoids without mention of complication   . Lumbago   . Macular degeneration (senile) of retina, unspecified   . Osteoarthrosis, unspecified whether generalized or localized, unspecified site   . Other and unspecified hyperlipidemia   . Pain in joint, hand   . Palpitations   . Reflux esophagitis   . Scoliosis (and kyphoscoliosis), idiopathic   . Senile osteoporosis   . Skin disorder   . Thyroid disease   . TIA (transient ischemic attack)    . Unruptured popliteal cyst 06/24/2014   Right knee   . Unspecified essential hypertension   . Unspecified glaucoma(365.9)   . Unspecified hypothyroidism   . Unspecified vitamin D deficiency     Past Surgical History:  Procedure Laterality Date  . ABDOMINAL HYSTERECTOMY  1975   Dr BMallie Mussel . APPENDECTOMY  1988  . cardiolite myocardial perfusion study    . DOPPLER ECHOCARDIOGRAPHY    . EYE SURGERY Bilateral 2009   cataract removed right eye, Dr ECharise Killian . FOOT SURGERY  august 2013   HDoran Durand MD  . INGUINAL HERNIA REPAIR Bilateral    w/mesh  . INGUINAL HERNIA REPAIR Left 08/03/2017   Procedure: LAPAROSCOPIC LEFT INGUINAL HERNIA REPAIR WITH MESH;  Surgeon: WGreer Pickerel MD;  [place] [facility]  Service: General;  Laterality: Left;  . INGUINAL HERNIA REPAIR Right 08/03/2017   Procedure: HERNIA REPAIR RIGHT INGUINAL ADULT WITH MESH;  Surgeon: WGreer Pickerel MD;  [place] [facility]  Service: General;  Laterality: Right;  . INGUINAL HERNIA REPAIR     Dr. WRedmond Pulling12-11-19  . INGUINAL HERNIA REPAIR Right 04/24/2018   Procedure: DIAGNOSTIC LAPAROSCOPIC, OPENREPAIR OF RECURRENT RIGHT INGUINAL HERNIA WITH MESH ERAS PATHWAY;  Surgeon: WGreer Pickerel MD;  [facility];  Service: General;  Laterality: Right;  . INSERTION OF MESH Bilateral 08/03/2017   Procedure: INSERTION OF MESH;  Surgeon: WGreer Pickerel MD;  [place] [facility]  Service: General;  Laterality: Bilateral;  .  NM MYOVIEW LTD     negative  . TONSILLECTOMY  1937  . TOOTH EXTRACTION  09/16/13   Dr Carlos American    Allergies  Allergen Reactions  . Trimethoprim Other (See Comments)    Headaches/ ear pressure   . Erythromycin Other (See Comments)    UNSPECIFIED REACTION   . Macrodantin [Nitrofurantoin Macrocrystal] Other (See Comments)    UNSPECIFIED REACTION   . Penicillins Other (See Comments)    UNSPECIFIED REACTION  Has patient had a PCN reaction causing immediate rash, facial/tongue/throat swelling, SOB or lightheadedness with hypotension:  Unknown Has patient had a PCN reaction causing severe rash involving mucus membranes or skin necrosis: Unknown Has patient had a PCN reaction that required hospitalization: No Has patient had a PCN reaction occurring within the last 10 years: No If all of the above answers are "NO", then may proceed with Cephalosporin use.  . Sulfa Antibiotics Other (See Comments)    UNSPECIFIED REACTION   . Latex Itching    Allergies as of 09/16/2019      Reactions   Trimethoprim Other (See Comments)   Headaches/ ear pressure    Erythromycin Other (See Comments)   UNSPECIFIED REACTION    Macrodantin [nitrofurantoin Macrocrystal] Other (See Comments)   UNSPECIFIED REACTION    Penicillins Other (See Comments)   UNSPECIFIED REACTION  Has patient had a PCN reaction causing immediate rash, facial/tongue/throat swelling, SOB or lightheadedness with hypotension: Unknown Has patient had a PCN reaction causing severe rash involving mucus membranes or skin necrosis: Unknown Has patient had a PCN reaction that required hospitalization: No Has patient had a PCN reaction occurring within the last 10 years: No If all of the above answers are "NO", then may proceed with Cephalosporin use.   Sulfa Antibiotics Other (See Comments)   UNSPECIFIED REACTION    Latex Itching      Medication List       Accurate as of Sep 16, 2019 11:59 PM. If you have any questions, ask your nurse or doctor.        acetaminophen 500 MG tablet Commonly known as: TYLENOL Take 500 mg by mouth 2 (two) times daily as needed for mild pain or headache.   bimatoprost 0.01 % Soln Commonly known as: LUMIGAN Place 1 drop into both eyes at bedtime.   Calcium Carbonate-Vitamin D 600-400 MG-UNIT chew tablet Chew 1 tablet by mouth 2 (two) times daily.   camphor-menthol lotion Commonly known as: SARNA Apply 1 application topically as needed for itching. Prescribed by Dermatology at [facility]   clopidogrel 75 MG tablet Commonly known  as: PLAVIX One tablet daily to prevent stroke   CRANBERRY EXTRACT PO Take 1 tablet by mouth daily.   famotidine 10 MG chewable tablet Commonly known as: PEPCID AC Chew 10 mg by mouth at bedtime as needed for heartburn.   furosemide 20 MG tablet Commonly known as: LASIX Take 1 tablet (20 mg total) by mouth daily. Started by: Persais Ethridge X Sharline Lehane, NP   hydrocortisone cream 1 % Apply 1 application topically daily as needed for itching.   metoprolol succinate 25 MG 24 hr tablet Commonly known as: TOPROL-XL TAKE 1 TABLET ONCE DAILY TO REGULATE HEART AND CONTROL BLOOD PRESSURE.   potassium chloride SA 20 MEQ tablet Commonly known as: KLOR-CON Take 1 tablet (20 mEq total) by mouth daily. Started by: Lashawn Bromwell X Brannon Levene, NP   PRESERVISION AREDS 2 PO Take 1 capsule by mouth 2 (two) times daily.   psyllium 58.6 % packet Commonly  known as: METAMUCIL Take 1 packet by mouth daily. Mix with water   Systane Ultra 0.4-0.3 % Soln Generic drug: Polyethyl Glycol-Propyl Glycol Place 1 vial into both eyes 2 (two) times daily. May use another dose during the day as needed for dry eye   vitamin C 500 MG tablet Commonly known as: ASCORBIC ACID Take 500 mg by mouth daily with supper.   Vitamin D3 50 MCG (2000 UT) Tabs Take by mouth.       Review of Systems:  Review of Systems  Constitutional: Negative for activity change, appetite change, fatigue and fever.  HENT: Positive for hearing loss. Negative for congestion and voice change.   Eyes: Positive for visual disturbance.  Respiratory: Negative for cough, chest tightness, shortness of breath and wheezing.   Cardiovascular: Positive for leg swelling. Negative for chest pain and palpitations.  Gastrointestinal: Negative for abdominal distention, abdominal pain, constipation, diarrhea, nausea and vomiting.  Genitourinary: Negative for difficulty urinating, dysuria and urgency.  Musculoskeletal: Positive for arthralgias and gait problem.       Lower back,  left hip/leg pain since 09/24/19, working with therapy, saw and f/u Ortho  Skin: Negative for color change and pallor.  Neurological: Negative for dizziness, speech difficulty and headaches.  Psychiatric/Behavioral: Negative for agitation, behavioral problems, hallucinations and sleep disturbance. The patient is not nervous/anxious.     Health Maintenance  Topic Date Due  . INFLUENZA VACCINE  12/14/2019  . TETANUS/TDAP  03/20/2020  . MAMMOGRAM  05/13/2020  . DEXA SCAN  Completed  . COVID-19 Vaccine  Completed  . PNA vac Low Risk Adult  Completed    Physical Exam: Vitals:   09/16/19 1408  BP: 118/76  Pulse: 68  Temp: 97.9 F (36.6 C)  SpO2: 97%  Weight: 134 lb 6.4 oz (61 kg)  Height: '5\' 2"'  (1.575 m)   Body mass index is 24.58 kg/m. Physical Exam Vitals and nursing note reviewed.  Constitutional:      General: She is not in acute distress.    Appearance: Normal appearance. She is not ill-appearing, toxic-appearing or diaphoretic.  HENT:     Head: Normocephalic and atraumatic.     Nose: Nose normal.     Mouth/Throat:     Mouth: Mucous membranes are moist.  Eyes:     Extraocular Movements: Extraocular movements intact.     Conjunctiva/sclera: Conjunctivae normal.     Pupils: Pupils are equal, round, and reactive to light.  Cardiovascular:     Rate and Rhythm: Normal rate and regular rhythm.     Heart sounds: Murmur present.     Comments: DP pulses presents R+L Pulmonary:     Effort: Pulmonary effort is normal.     Breath sounds: Normal breath sounds. No wheezing, rhonchi or rales.  Abdominal:     General: Bowel sounds are normal.     Palpations: Abdomen is soft.     Tenderness: There is no abdominal tenderness. There is no right CVA tenderness, left CVA tenderness or guarding.     Hernia: A hernia is present.     Comments: Right inguinal hernia  Musculoskeletal:     Cervical back: Normal range of motion and neck supple.     Right lower leg: Edema present.      Left lower leg: Edema present.     Comments: 2+ edema BLE  Skin:    General: Skin is warm and dry.  Neurological:     General: No focal deficit present.  Mental Status: She is alert and oriented to person, place, and time. Mental status is at baseline.     Gait: Gait abnormal.     Comments: Uses cane  Psychiatric:        Mood and Affect: Mood normal.        Behavior: Behavior normal.        Thought Content: Thought content normal.        Judgment: Judgment normal.     Labs reviewed: Basic Metabolic Panel: Recent Labs    10/28/18 0000 07/10/19 0916  NA 140 139  K 3.8 4.0  CL 102 103  CO2 30 28  GLUCOSE 76 83  BUN 20 26*  CREATININE 0.65 0.70  CALCIUM 9.6 9.3   Liver Function Tests: Recent Labs    10/28/18 0000 07/10/19 0916  AST 18 18  ALT 13 13  BILITOT 0.4 0.4  PROT 6.5 6.3   No results for input(s): LIPASE, AMYLASE in the last 8760 hours. No results for input(s): AMMONIA in the last 8760 hours. CBC: Recent Labs    10/28/18 0000 07/10/19 0916  WBC 6.7 6.8  NEUTROABS 2,854 3,781  HGB 13.2 12.4  HCT 40.3 37.3  MCV 93.5 89.9  PLT 228 229   Lipid Panel: Recent Labs    07/10/19 0916  CHOL 188  HDL 75  LDLCALC 94  TRIG 96  CHOLHDL 2.5   Lab Results  Component Value Date   HGBA1C 5.3 05/02/2013    Procedures since last visit: No results found.  Assessment/Plan Chronic venous insufficiency apparently swelling in BLE, the patient denied cough, SOB, PND, phlegm production, chest pain/pressure, or palpitation. May consider echocardiogram if s/s of CHF develops or f/u Cardiology, will trial Furosemide 2m qd, Kcl 124m qd, update CMP/eGFR 2 weeks. Will apply TED, elevate legs as much as possible.   Essential hypertension Blood pressure is controlled, continue Metoprolol.   Inguinal hernia CT pelvis w CM for right groin swelling 06/26/19 identified right inguinal hernia. Avoid constipation or activities may increase intraabdominal pressure.   S/p surgical repair x2   Low back pain radiating to left lower extremity Flare up 09/24/19, saw Ortho, working with therapy.     Labs/tests ordered:  CMP/eGFR 2 weeks  Next appt: f/u GuLyndel Safe-3 weeks.

## 2019-09-17 ENCOUNTER — Encounter: Payer: Self-pay | Admitting: Nurse Practitioner

## 2019-09-18 DIAGNOSIS — H52203 Unspecified astigmatism, bilateral: Secondary | ICD-10-CM | POA: Diagnosis not present

## 2019-09-18 DIAGNOSIS — H353133 Nonexudative age-related macular degeneration, bilateral, advanced atrophic without subfoveal involvement: Secondary | ICD-10-CM | POA: Diagnosis not present

## 2019-09-18 DIAGNOSIS — H401132 Primary open-angle glaucoma, bilateral, moderate stage: Secondary | ICD-10-CM | POA: Diagnosis not present

## 2019-09-18 DIAGNOSIS — H04123 Dry eye syndrome of bilateral lacrimal glands: Secondary | ICD-10-CM | POA: Diagnosis not present

## 2019-09-25 ENCOUNTER — Other Ambulatory Visit: Payer: Self-pay

## 2019-09-25 DIAGNOSIS — I872 Venous insufficiency (chronic) (peripheral): Secondary | ICD-10-CM

## 2019-09-25 LAB — COMPLETE METABOLIC PANEL WITH GFR
AG Ratio: 2 (calc) (ref 1.0–2.5)
ALT: 14 U/L (ref 6–29)
AST: 18 U/L (ref 10–35)
Albumin: 4.3 g/dL (ref 3.6–5.1)
Alkaline phosphatase (APISO): 60 U/L (ref 37–153)
BUN/Creatinine Ratio: 41 (calc) — ABNORMAL HIGH (ref 6–22)
BUN: 32 mg/dL — ABNORMAL HIGH (ref 7–25)
CO2: 28 mmol/L (ref 20–32)
Calcium: 9.4 mg/dL (ref 8.6–10.4)
Chloride: 103 mmol/L (ref 98–110)
Creat: 0.78 mg/dL (ref 0.60–0.88)
GFR, Est African American: 78 mL/min/{1.73_m2} (ref >=60)
GFR, Est Non African American: 67 mL/min/{1.73_m2} (ref >=60)
Globulin: 2.2 g/dL (calc) (ref 1.9–3.7)
Glucose, Bld: 77 mg/dL (ref 65–99)
Potassium: 4.2 mmol/L (ref 3.5–5.3)
Sodium: 138 mmol/L (ref 135–146)
Total Bilirubin: 0.4 mg/dL (ref 0.2–1.2)
Total Protein: 6.5 g/dL (ref 6.1–8.1)

## 2019-10-01 ENCOUNTER — Encounter: Payer: Self-pay | Admitting: Internal Medicine

## 2019-10-01 ENCOUNTER — Non-Acute Institutional Stay: Payer: Medicare Other | Admitting: Internal Medicine

## 2019-10-01 ENCOUNTER — Other Ambulatory Visit: Payer: Self-pay

## 2019-10-01 VITALS — BP 100/64 | HR 66 | Temp 97.4°F | Ht 62.0 in | Wt 129.4 lb

## 2019-10-01 DIAGNOSIS — S90112A Contusion of left great toe without damage to nail, initial encounter: Secondary | ICD-10-CM

## 2019-10-01 DIAGNOSIS — I10 Essential (primary) hypertension: Secondary | ICD-10-CM | POA: Diagnosis not present

## 2019-10-01 DIAGNOSIS — R600 Localized edema: Secondary | ICD-10-CM | POA: Diagnosis not present

## 2019-10-01 NOTE — Progress Notes (Signed)
Location: [place] of Service:  Clinic (12)  Provider:   Code Status:  Goals of Care:  Advanced Directives 07/10/2019  Does Patient Have a Medical Advance Directive? Yes  Type of Paramedic of [place];Living will;Out of facility DNR (pink MOST or yellow form)  Does patient want to make changes to medical advance directive? No - Patient declined  Copy of [place] in Chart? Yes - validated most recent copy scanned in chart (See row information)  Pre-existing out of facility DNR order (yellow form or pink MOST form) Yellow form placed in chart (order not valid for inpatient use)     Chief Complaint  Patient presents with  . Medical Management of Chronic Issues    Patient returns to the clinic for a follow up. She has been avoiding salt but did have some Teriyaki last night. She would like her left big toe looked at.  Dr. Mallie Mussel has been cutting her nails.     HPI: Patient is a 86 y.o. female seen today for an acute visit for pain in left great toe on follow-up off lower extremity edema  Bilateral lower extremity edema Was recently seen by [facility] for lower extremity edema and was started on Lasix .  Patient is doing very well and her edema is almost resolved. Pain in left great toe She thought that she had an ingrown nail as her toe has been hurting since she saw the podiatrist in the facility. Past Medical History:  Diagnosis Date  . Cervicalgia   . Chest pain at rest 01/07/2012  . Displacement of cervical intervertebral disc without myelopathy   . Diverticulosis of colon (without mention of hemorrhage)   . Female stress incontinence   . GERD (gastroesophageal reflux disease)    no peds occasional pepcid  . Inguinal hernia without mention of obstruction or gangrene, unilateral or unspecified, (not specified as recurrent)   . Internal hemorrhoids without mention of complication   . Lumbago   . Macular degeneration  (senile) of retina, unspecified   . Osteoarthrosis, unspecified whether generalized or localized, unspecified site   . Other and unspecified hyperlipidemia   . Pain in joint, hand   . Palpitations   . Reflux esophagitis   . Scoliosis (and kyphoscoliosis), idiopathic   . Senile osteoporosis   . Skin disorder   . Thyroid disease   . TIA (transient ischemic attack)   . Unruptured popliteal cyst 06/24/2014   Right knee   . Unspecified essential hypertension   . Unspecified glaucoma(365.9)   . Unspecified hypothyroidism   . Unspecified vitamin D deficiency     Past Surgical History:  Procedure Laterality Date  . ABDOMINAL HYSTERECTOMY  1975   Dr Mallie Mussel  . APPENDECTOMY  1988  . cardiolite myocardial perfusion study    . DOPPLER ECHOCARDIOGRAPHY    . EYE SURGERY Bilateral 2009   cataract removed right eye, Dr Charise Killian  . FOOT SURGERY  august 2013   Doran Durand, MD  . INGUINAL HERNIA REPAIR Bilateral    w/mesh  . INGUINAL HERNIA REPAIR Left 08/03/2017   Procedure: LAPAROSCOPIC LEFT INGUINAL HERNIA REPAIR WITH MESH;  Surgeon: Greer Pickerel, MD;  Location: [place];  Service: General;  Laterality: Left;  . INGUINAL HERNIA REPAIR Right 08/03/2017   Procedure: HERNIA REPAIR RIGHT INGUINAL ADULT WITH MESH;  Surgeon: Greer Pickerel, MD;  Location: [place];  Service: General;  Laterality: Right;  . INGUINAL HERNIA REPAIR  Dr. Redmond Pulling 04-24-18  . INGUINAL HERNIA REPAIR Right 04/24/2018   Procedure: DIAGNOSTIC LAPAROSCOPIC, OPENREPAIR OF RECURRENT RIGHT INGUINAL HERNIA WITH MESH ERAS PATHWAY;  Surgeon: Greer Pickerel, MD;  Location: [facility];  Service: General;  Laterality: Right;  . INSERTION OF MESH Bilateral 08/03/2017   Procedure: INSERTION OF MESH;  Surgeon: Greer Pickerel, MD;  Location: [place];  Service: General;  Laterality: Bilateral;  . NM MYOVIEW LTD     negative  . TONSILLECTOMY  1937  . TOOTH EXTRACTION  09/16/13   Dr Carlos American    Allergies  Allergen Reactions  . Trimethoprim Other (See Comments)      Headaches/ ear pressure   . Erythromycin Other (See Comments)    UNSPECIFIED REACTION   . Macrodantin [Nitrofurantoin Macrocrystal] Other (See Comments)    UNSPECIFIED REACTION   . Penicillins Other (See Comments)    UNSPECIFIED REACTION  Has patient had a PCN reaction causing immediate rash, facial/tongue/throat swelling, SOB or lightheadedness with hypotension: Unknown Has patient had a PCN reaction causing severe rash involving mucus membranes or skin necrosis: Unknown Has patient had a PCN reaction that required hospitalization: No Has patient had a PCN reaction occurring within the last 10 years: No If all of the above answers are "NO", then may proceed with Cephalosporin use.  . Sulfa Antibiotics Other (See Comments)    UNSPECIFIED REACTION   . Latex Itching    Outpatient Encounter Medications as of 10/01/2019  Medication Sig  . acetaminophen (TYLENOL) 500 MG tablet Take 500 mg by mouth 2 (two) times daily as needed for mild pain or headache.   . bimatoprost (LUMIGAN) 0.01 % SOLN Place 1 drop into both eyes at bedtime.  . Calcium Carbonate-Vitamin D 600-400 MG-UNIT chew tablet Chew 1 tablet by mouth 2 (two) times daily.   . camphor-menthol (SARNA) lotion Apply 1 application topically as needed for itching. Prescribed by Dermatology at [facility]  . Cholecalciferol (VITAMIN D3) 50 MCG (2000 UT) TABS Take by mouth.  . clopidogrel (PLAVIX) 75 MG tablet One tablet daily to prevent stroke  . CRANBERRY EXTRACT PO Take 1 tablet by mouth daily.  . famotidine (PEPCID AC) 10 MG chewable tablet Chew 10 mg by mouth at bedtime as needed for heartburn.  . furosemide (LASIX) 20 MG tablet Take 1 tablet (20 mg total) by mouth daily.  . hydrocortisone cream 1 % Apply 1 application topically daily as needed for itching.   . metoprolol succinate (TOPROL-XL) 25 MG 24 hr tablet TAKE 1 TABLET ONCE DAILY TO REGULATE HEART AND CONTROL BLOOD PRESSURE.  . Multiple Vitamins-Minerals (PRESERVISION AREDS  2 PO) Take 1 capsule by mouth 2 (two) times daily.   Vladimir Faster Glycol-Propyl Glycol (SYSTANE ULTRA) 0.4-0.3 % SOLN Place 1 vial into both eyes 2 (two) times daily. May use another dose during the day as needed for dry eye   . potassium chloride SA (KLOR-CON) 20 MEQ tablet Take 1 tablet (20 mEq total) by mouth daily.  . psyllium (METAMUCIL) 58.6 % packet Take 1 packet by mouth daily. Mix with water  . vitamin C (ASCORBIC ACID) 500 MG tablet Take 500 mg by mouth daily with supper.    No facility-administered encounter medications on file as of 10/01/2019.    Review of Systems:  Review of Systems  Constitutional: Negative.   HENT: Negative.   Respiratory: Negative.   Cardiovascular: Positive for leg swelling.  Gastrointestinal: Positive for abdominal distention.  Genitourinary: Negative.   Musculoskeletal: Positive for gait problem.  Skin: Positive for color change.  Neurological: Positive for weakness.  Psychiatric/Behavioral: Negative.     Health Maintenance  Topic Date Due  . INFLUENZA VACCINE  12/14/2019  . TETANUS/TDAP  03/20/2020  . MAMMOGRAM  05/13/2020  . DEXA SCAN  Completed  . COVID-19 Vaccine  Completed  . PNA vac Low Risk Adult  Completed    Physical Exam: Vitals:   10/01/19 1628  BP: 100/64 Manually Repeat was 130/70  Pulse: 66  Temp: (!) 97.4 F (36.3 C)  SpO2: 97%  Weight: 129 lb 6.4 oz (58.7 kg)  Height: 5\' 2"  (1.575 m)   Body mass index is 23.67 kg/m. Physical Exam  Constitutional: Oriented to person, place, and time. Well-developed and well-nourished.  HENT:  Head: Normocephalic.  Mouth/Throat: Oropharynx is clear and moist.  Eyes: Pupils are equal, round, and reactive to light.  Neck: Neck supple.  Cardiovascular: Normal rate and normal heart sounds.  No murmur heard. Pulmonary/Chest: Effort normal and breath sounds normal. No respiratory distress. No wheezes. She has no rales.  Abdominal: Soft. Bowel sounds are normal. No distension. There is  no tenderness. There is no rebound.  Musculoskeletal: Trace edema BIlateral  Trimmed Toe Nails with No Signs of infection Lymphadenopathy: none Neurological: Alert and oriented to person, place, and time.  Walking with the walker Skin: Skin is warm and dry.  Psychiatric: Normal mood and affect. Behavior is normal. Thought content normal.    Labs reviewed: Basic Metabolic Panel: Recent Labs    10/28/18 0000 07/10/19 0916 09/25/19 0846  NA 140 139 138  K 3.8 4.0 4.2  CL 102 103 103  CO2 30 28 28   GLUCOSE 76 83 77  BUN 20 26* 32*  CREATININE 0.65 0.70 0.78  CALCIUM 9.6 9.3 9.4   Liver Function Tests: Recent Labs    10/28/18 0000 07/10/19 0916 09/25/19 0846  AST 18 18 18   ALT 13 13 14   BILITOT 0.4 0.4 0.4  PROT 6.5 6.3 6.5   No results for input(s): LIPASE, AMYLASE in the last 8760 hours. No results for input(s): AMMONIA in the last 8760 hours. CBC: Recent Labs    10/28/18 0000 07/10/19 0916  WBC 6.7 6.8  NEUTROABS 2,854 3,781  HGB 13.2 12.4  HCT 40.3 37.3  MCV 93.5 89.9  PLT 228 229   Lipid Panel: Recent Labs    07/10/19 0916  CHOL 188  HDL 75  LDLCALC 94  TRIG 96  CHOLHDL 2.5   Lab Results  Component Value Date   HGBA1C 5.3 05/02/2013    Procedures since last visit: No results found.  Assessment/Plan Bilateral leg edema Discussed with the patient  Her edema is much better since she was started on Lasix We will decrease the dose to 3 times a week Patient will continue her diet modification and elevate the legs when when sitting BUN slightly elevated No follow-up with Dr. Mariea Clonts We discussed about follow-up echocardiogram if the swelling comes back.  Contusion of left great toe without damage to nail, initial encounter I did not see any kind of infection Reassured patient will continue to follow  Essential hypertension Her initial blood pressure was low but was normal on repeat We discussed about Lasix with Lopressor probably the  cause She will check her blood pressure at home She is asymptomatic at this time Cutting back Lasix will help to     Labs/tests ordered:  * No order type specified * Next appt:  10/20/2019  Total time spent in  this patient care encounter was  45_  minutes; greater than 50% of the visit spent counseling patient and staff, reviewing records , Labs and coordinating care for problems addressed at this encounter.

## 2019-10-10 ENCOUNTER — Other Ambulatory Visit: Payer: Self-pay | Admitting: Internal Medicine

## 2019-10-10 DIAGNOSIS — G459 Transient cerebral ischemic attack, unspecified: Secondary | ICD-10-CM

## 2019-10-14 DIAGNOSIS — R2681 Unsteadiness on feet: Secondary | ICD-10-CM | POA: Diagnosis not present

## 2019-10-14 DIAGNOSIS — M545 Low back pain: Secondary | ICD-10-CM | POA: Diagnosis not present

## 2019-10-15 DIAGNOSIS — M545 Low back pain: Secondary | ICD-10-CM | POA: Diagnosis not present

## 2019-10-15 DIAGNOSIS — R2681 Unsteadiness on feet: Secondary | ICD-10-CM | POA: Diagnosis not present

## 2019-10-16 DIAGNOSIS — R2681 Unsteadiness on feet: Secondary | ICD-10-CM | POA: Diagnosis not present

## 2019-10-16 DIAGNOSIS — M545 Low back pain: Secondary | ICD-10-CM | POA: Diagnosis not present

## 2019-10-20 ENCOUNTER — Telehealth: Payer: Self-pay

## 2019-10-20 ENCOUNTER — Other Ambulatory Visit: Payer: Self-pay

## 2019-10-20 ENCOUNTER — Encounter: Payer: Medicare Other | Admitting: Family

## 2019-10-20 DIAGNOSIS — L821 Other seborrheic keratosis: Secondary | ICD-10-CM | POA: Diagnosis not present

## 2019-10-20 DIAGNOSIS — D692 Other nonthrombocytopenic purpura: Secondary | ICD-10-CM | POA: Diagnosis not present

## 2019-10-20 DIAGNOSIS — L570 Actinic keratosis: Secondary | ICD-10-CM | POA: Diagnosis not present

## 2019-10-20 DIAGNOSIS — D1801 Hemangioma of skin and subcutaneous tissue: Secondary | ICD-10-CM | POA: Diagnosis not present

## 2019-10-20 DIAGNOSIS — L814 Other melanin hyperpigmentation: Secondary | ICD-10-CM | POA: Diagnosis not present

## 2019-10-20 DIAGNOSIS — M545 Low back pain: Secondary | ICD-10-CM | POA: Diagnosis not present

## 2019-10-20 DIAGNOSIS — R2681 Unsteadiness on feet: Secondary | ICD-10-CM | POA: Diagnosis not present

## 2019-10-20 DIAGNOSIS — L853 Xerosis cutis: Secondary | ICD-10-CM | POA: Diagnosis not present

## 2019-10-20 NOTE — Telephone Encounter (Signed)
Patient was called 3 times to begin AWV. Voicemail's were left after each call. Patient was called at 8:50am, 9:23am, and 10:06am. Patient was advised to reschedule on last voicemail.

## 2019-10-27 ENCOUNTER — Other Ambulatory Visit: Payer: Self-pay

## 2019-10-27 ENCOUNTER — Encounter: Payer: Self-pay | Admitting: Family

## 2019-10-27 ENCOUNTER — Ambulatory Visit (INDEPENDENT_AMBULATORY_CARE_PROVIDER_SITE_OTHER): Payer: Medicare Other | Admitting: Family

## 2019-10-27 DIAGNOSIS — Z0000 Encounter for general adult medical examination without abnormal findings: Secondary | ICD-10-CM | POA: Diagnosis not present

## 2019-10-27 NOTE — Progress Notes (Signed)
Subjective:   [name] is a 86 y.o. female who presents for Medicare Annual (Subsequent) preventive examination.  Review of Systems:  Cardiac Risk Factors include: advanced age (>41men, >71 women);hypertension     Objective:     Vitals: There were no vitals taken for this visit.  There is no height or weight on file to calculate BMI.  Advanced Directives 10/27/2019 07/10/2019 03/10/2019 09/25/2018 09/18/2018 07/03/2018 04/24/2018  Does Patient Have a Medical Advance Directive? Yes Yes Yes Yes Yes Yes -  Type of Advance Directive Living will;Healthcare Power of Attorney;Out of facility DNR (pink MOST or yellow form) [place];Living will;Out of facility DNR (pink MOST or yellow form) Out of facility DNR (pink MOST or yellow form) Living will;Healthcare Power of [place];Out of facility DNR (pink MOST or yellow form) [place];Out of facility DNR (pink MOST or yellow form);Living will Living will;Healthcare Power of [place];Out of facility DNR (pink MOST or yellow form) -  Does patient want to make changes to medical advance directive? No - Patient declined No - Patient declined No - Guardian declined No - Patient declined No - Patient declined - No - Patient declined  Copy of [place] in Chart? Yes - validated most recent copy scanned in chart (See row information) Yes - validated most recent copy scanned in chart (See row information) - Yes - validated most recent copy scanned in chart (See row information) Yes - validated most recent copy scanned in chart (See row information) Yes - validated most recent copy scanned in chart (See row information) -  Pre-existing out of facility DNR order (yellow form or pink MOST form) - Yellow form placed in chart (order not valid for inpatient use) Yellow form placed in chart (order not valid for inpatient use) Yellow form placed in chart (order not valid for inpatient use);Pink MOST form  placed in chart (order not valid for inpatient use) Yellow form placed in chart (order not valid for inpatient use) Yellow form placed in chart (order not valid for inpatient use) -    Tobacco Social History   Tobacco Use  Smoking Status Never Smoker  Smokeless Tobacco Never Used     Counseling given: Not Answered   Clinical Intake:  Pre-visit preparation completed: No  Pain : No/denies pain     BMI - recorded: 23.67  How often do you need to have someone help you when you read instructions, pamphlets, or other written materials from your doctor or pharmacy?: 1 - Never What is the last grade level you completed in school?: Graduate degree  Interpreter Needed?: No  Information entered by :: Dalyn Kjos FNP-C  Past Medical History:  Diagnosis Date  . Cervicalgia   . Chest pain at rest 01/07/2012  . Displacement of cervical intervertebral disc without myelopathy   . Diverticulosis of colon (without mention of hemorrhage)   . Female stress incontinence   . GERD (gastroesophageal reflux disease)    no peds occasional pepcid  . Inguinal hernia without mention of obstruction or gangrene, unilateral or unspecified, (not specified as recurrent)   . Internal hemorrhoids without mention of complication   . Lumbago   . Macular degeneration (senile) of retina, unspecified   . Osteoarthrosis, unspecified whether generalized or localized, unspecified site   . Other and unspecified hyperlipidemia   . Pain in joint, hand   . Palpitations   . Reflux esophagitis   . Scoliosis (and kyphoscoliosis), idiopathic   .  Senile osteoporosis   . Skin disorder   . Thyroid disease   . TIA (transient ischemic attack)   . Unruptured popliteal cyst 06/24/2014   Right knee   . Unspecified essential hypertension   . Unspecified glaucoma(365.9)   . Unspecified hypothyroidism   . Unspecified vitamin D deficiency    Past Surgical History:  Procedure Laterality Date  . ABDOMINAL HYSTERECTOMY   1975   Dr Mallie Mussel  . APPENDECTOMY  1988  . cardiolite myocardial perfusion study    . DOPPLER ECHOCARDIOGRAPHY    . EYE SURGERY Bilateral 2009   cataract removed right eye, Dr Charise Killian  . FOOT SURGERY  august 2013   Doran Durand, MD  . INGUINAL HERNIA REPAIR Bilateral    w/mesh  . INGUINAL HERNIA REPAIR Left 08/03/2017   Procedure: LAPAROSCOPIC LEFT INGUINAL HERNIA REPAIR WITH MESH;  Surgeon: Greer Pickerel, MD;  Location: [place];  Service: General;  Laterality: Left;  . INGUINAL HERNIA REPAIR Right 08/03/2017   Procedure: HERNIA REPAIR RIGHT INGUINAL ADULT WITH MESH;  Surgeon: Greer Pickerel, MD;  Location: [place];  Service: General;  Laterality: Right;  . INGUINAL HERNIA REPAIR     Dr. Redmond Pulling 04-24-18  . INGUINAL HERNIA REPAIR Right 04/24/2018   Procedure: DIAGNOSTIC LAPAROSCOPIC, OPENREPAIR OF RECURRENT RIGHT INGUINAL HERNIA WITH MESH ERAS PATHWAY;  Surgeon: Greer Pickerel, MD;  [facility];  Service: General;  Laterality: Right;  . INSERTION OF MESH Bilateral 08/03/2017   Procedure: INSERTION OF MESH;  Surgeon: Greer Pickerel, MD;  Location: [place];  Service: General;  Laterality: Bilateral;  . NM MYOVIEW LTD     negative  . TONSILLECTOMY  1937  . TOOTH EXTRACTION  09/16/13   Dr Carlos American   Family History  Problem Relation Age of Onset  . CAD Mother   . Parkinson's disease Mother   . Cancer Father        colon cancer  . Parkinson's disease Father   . Stroke Maternal Grandmother    Social History   Socioeconomic History  . Marital status: Widowed    Spouse name: Not on file  . Number of children: 2  . Years of education: college  . Highest education level: Not on file  Occupational History    Comment: retired  Tobacco Use  . Smoking status: Never Smoker  . Smokeless tobacco: Never Used  Vaping Use  . Vaping Use: Never used  Substance and Sexual Activity  . Alcohol use: No  . Drug use: No  . Sexual activity: Not Currently  Other Topics Concern  . Not on file  Social History  Narrative   Patient lives at home with her daughter [name]) , moved to [facility] 02/02/16 [place]    Widowed.   Retired.   EducationNurse, mental health.   Right handed.   Caffeine- None some times tea very rare.   Never smoked   Alcohol none   Social Determinants of Health   Financial Resource Strain:   . Difficulty of Paying Living Expenses:   Food Insecurity:   . Worried About Charity fundraiser in the Last Year:   . Arboriculturist in the Last Year:   Transportation Needs:   . Film/video editor (Medical):   Marland Kitchen Lack of Transportation (Non-Medical):   Physical Activity:   . Days of Exercise per Week:   . Minutes of Exercise per Session:   Stress:   . Feeling of Stress :   Social Connections:   .  Frequency of Communication with Friends and Family:   . Frequency of Social Gatherings with Friends and Family:   . Attends Religious Services:   . Active Member of Clubs or Organizations:   . Attends Archivist Meetings:   Marland Kitchen Marital Status:     Outpatient Encounter Medications as of 10/27/2019  Medication Sig  . acetaminophen (TYLENOL) 500 MG tablet Take 500 mg by mouth 2 (two) times daily as needed for mild pain or headache.   . bimatoprost (LUMIGAN) 0.01 % SOLN Place 1 drop into both eyes at bedtime.  . Calcium Carbonate-Vitamin D 600-400 MG-UNIT chew tablet Chew 1 tablet by mouth 2 (two) times daily.   . camphor-menthol (SARNA) lotion Apply 1 application topically as needed for itching. Prescribed by [facility]ermatology at [facility]  . Cholecalciferol (VITAMIN D3) 50 MCG (2000 UT) TABS Take by mouth.  . clopidogrel (PLAVIX) 75 MG tablet TAKE 1 TABLET ONCE A DAY TO PREVENT STROKE.  Marland Kitchen CRANBERRY EXTRACT PO Take 1 tablet by mouth daily.  . famotidine (PEPCID AC) 10 MG chewable tablet Chew 10 mg by mouth at bedtime as needed for heartburn.  . furosemide (LASIX) 20 MG tablet Take 20 mg by mouth 3 (three) times a week.  . hydrocortisone cream 1 % Apply 1 application topically  daily as needed for itching.   . metoprolol succinate (TOPROL-XL) 25 MG 24 hr tablet TAKE 1 TABLET ONCE DAILY TO REGULATE HEART AND CONTROL BLOOD PRESSURE.  . Multiple Vitamins-Minerals (PRESERVISION AREDS 2 PO) Take 1 capsule by mouth 2 (two) times daily.   Vladimir Faster Glycol-Propyl Glycol (SYSTANE ULTRA) 0.4-0.3 % SOLN Place 1 vial into both eyes 2 (two) times daily. May use another dose during the day as needed for dry eye   . potassium chloride SA (KLOR-CON) 20 MEQ tablet Take 20 mEq by mouth 3 (three) times a week. With Lasix  . psyllium (METAMUCIL) 58.6 % packet Take 1 packet by mouth daily. Mix with water  . vitamin C (ASCORBIC ACID) 500 MG tablet Take 500 mg by mouth daily with supper.    No facility-administered encounter medications on file as of 10/27/2019.    Activities of Daily Living In your present state of health, do you have any difficulty performing the following activities: 10/27/2019  Hearing? N  Vision? N  Difficulty concentrating or making decisions? Y  Comment Remembering  Walking or climbing stairs? Y  Comment uses a cane  Dressing or bathing? N  Doing errands, shopping? Y  Comment daughter does Engineer, drilling and eating ? N  Comment eats one meal in the facility dinning  In the past six months, have you accidently leaked urine? Y  Comment wears incontinent  pad  Do you have problems with loss of bowel control? N  Managing your Medications? N  Managing your Finances? N  Housekeeping or managing your Housekeeping? Y  Comment Has  house keeper  Some recent data might be hidden    Patient Care Team: Gayland Curry, DO as PCP - General (Geriatric Medicine) Lorretta Harp, MD as PCP - Cardiology (Cardiology) Laurence Spates, MD (Inactive) as Consulting Physician (Gastroenterology) Myrlene Broker, MD as Attending Physician (Urology) Lorretta Harp, MD as Consulting Physician (Cardiology) Rozetta Nunnery, MD as Consulting Physician  (Otolaryngology) Richarda Osmond, DDS (Dentistry) Shon Hough, MD as Consulting Physician (Ophthalmology) Iran Planas, MD as Consulting Physician (Orthopedic Surgery) Greer Pickerel, MD as Consulting Physician (General Surgery)    Assessment:  This is a routine wellness examination for [facility].  Exercise Activities and Dietary recommendations Current Exercise Habits: Home exercise routine, Type of exercise: walking;strength training/weights;stretching, Time (Minutes): 30, Intensity: Moderate, Exercise limited by: None identified  Goals    . Increase physical activity     Starting 06/27/16, I will attempt to increase my physical activity.        Fall Risk Fall Risk  10/27/2019 10/01/2019 09/16/2019 07/10/2019 03/10/2019  Falls in the past year? 1 0 0 1 0  Number falls in past yr: 0 0 0 0 -  Comment - - - - -  Injury with Fall? 0 - - 0 -  Risk for fall due to : - - - - -  Follow up - - - - -   Is the patient's home free of loose throw rugs in walkways, pet beds, electrical cords, etc?   no      Grab bars in the bathroom? yes      Handrails on the stairs?   yes      Adequate lighting?   yes  Depression Screen PHQ 2/9 Scores 10/27/2019 03/10/2019 11/04/2018 10/18/2018  PHQ - 2 Score 0 0 0 0     Cognitive Function MMSE - Mini Mental State Exam 10/15/2017 06/27/2016 06/22/2015 06/24/2014  Not completed: - - (No Data) -  Orientation to time 5 5 5 5   Orientation to Place 5 5 5 5   Registration 3 3 3 3   Attention/ Calculation 5 5 5 5   Recall 3 1 1 1   Language- name 2 objects 2 2 2 2   Language- repeat 1 1 1 1   Language- follow 3 step command 3 3 3 3   Language- read & follow direction 1 1 1 1   Write a sentence 1 1 1 1   Copy design 1 1 1 1   Total score 30 28 28 28      6CIT Screen 10/27/2019 10/18/2018  What Year? 0 points 0 points  What month? 0 points 0 points  What time? 0 points 0 points  Count back from 20 0 points 0 points  Months in reverse 0 points 0 points  Repeat phrase 2  points 0 points  Total Score 2 0    Immunization History  Administered Date(s) Administered  . Fluad Quad(high Dose 65+) 02/04/2019  . Influenza Whole 02/24/2011, 02/12/2012  . Influenza, High Dose Seasonal PF 02/01/2017, 02/25/2018  . Influenza,inj,Quad PF,6+ Mos 02/20/2013, 02/10/2014, 02/19/2015, 02/18/2016  . Moderna SARS-COVID-2 Vaccination 05/19/2019, 06/16/2019  . Pneumococcal Conjugate-13 06/24/2014  . Pneumococcal Polysaccharide-23 04/12/1998  . Td 02/21/1996, 07/23/2003  . Tdap 03/20/2010  . Zoster 09/15/2005    Qualifies for Shingles Vaccine? States has had shingrix vaccine   Screening Tests Health Maintenance  Topic Date Due  . INFLUENZA VACCINE  12/14/2019  . TETANUS/TDAP  03/20/2020  . MAMMOGRAM  05/13/2020  . DEXA SCAN  Completed  . COVID-19 Vaccine  Completed  . PNA vac Low Risk Adult  Completed    Cancer Screenings: Lung: Low Dose CT Chest recommended if Age 39-80 years, 30 pack-year currently smoking OR have quit w/in 15years. Patient does not qualify. Breast:  Up to date on Mammogram? Yes   Up to date of Bone Density/Dexa? Yes Colorectal:Age out   Additional Screenings: Hepatitis C Screening: Low Risk      Plan:  Has had shingrix at her pharmacy will obtain records then pdate  I have personally reviewed and noted tuhe following in the patient's chart:   .  Medical and social history . Use of alcohol, tobacco or illicit drugs  . Current medications and supplements . Functional ability and status . Nutritional status . Physical activity . Advanced directives . List of other physicians . Hospitalizations, surgeries, and ER visits in previous 12 months . Vitals . Screenings to include cognitive, depression, and falls . Referrals and appointments  In addition, I have reviewed and discussed with patient certain preventive protocols, quality metrics, and best practice recommendations. A written personalized care plan for preventive services as  well as general preventive health recommendations were provided to patient.     Sandrea Hughs, NP  10/27/2019

## 2019-10-27 NOTE — Progress Notes (Signed)
    This service is provided via telemedicine  No vital signs collected/recorded due to the encounter was a telemedicine visit.   Location of patient (ex: home, work): Home.  Patient consents to a telephone visit: Yes.  Location of the provider (ex: office, home):  [facility].  Name of any referring provider: N/A  Names of all persons participating in the telemedicine service and their role in the encounter:  Patient, Khaden Gater, RMA, Ngetich, Dinah, NP.    Time spent on call: 8 minutes spent on the phone with Medical Assistant.   

## 2019-10-27 NOTE — Patient Instructions (Signed)
[name] , Thank you for taking time to come for your Medicare Wellness Visit. I appreciate your ongoing commitment to your health goals. Please review the following plan we discussed and let me know if I can assist you in the future.   Screening recommendations/referrals: Colonoscopy: Aged out  Mammogram: Up to date  Bone Density : Up to date  Recommended yearly ophthalmology/optometry visit for glaucoma screening and checkup Recommended yearly dental visit for hygiene and checkup  Vaccinations: Influenza vaccine : Up to date  Pneumococcal vaccine : Up to date  Tdap vaccine: Up to date  Shingles vaccine: will obtain records from pharmacy then update your records for shingrix vaccine.    Advanced directives: Yes   Conditions/risks identified: Advance age female > 75 yrs,Hypertension   Next appointment: 1 year    Preventive Care 41 Years and Older, Female Preventive care refers to lifestyle choices and visits with your health care provider that can promote health and wellness. What does preventive care include?  A yearly physical exam. This is also called an annual well check.  Dental exams once or twice a year.  Routine eye exams. Ask your health care provider how often you should have your eyes checked.  Personal lifestyle choices, including:  Daily care of your teeth and gums.  Regular physical activity.  Eating a healthy diet.  Avoiding tobacco and drug use.  Limiting alcohol use.  Practicing safe sex.  Taking low-dose aspirin every day.  Taking vitamin and mineral supplements as recommended by your health care provider. What happens during an annual well check? The services and screenings done by your health care provider during your annual well check will depend on your age, overall health, lifestyle risk factors, and family history of disease. Counseling  Your health care provider may ask you questions about your:  Alcohol use.  Tobacco use.  Drug  use.  Emotional well-being.  Home and relationship well-being.  Sexual activity.  Eating habits.  History of falls.  Memory and ability to understand (cognition).  Work and work Statistician.  Reproductive health. Screening  You may have the following tests or measurements:  Height, weight, and BMI.  Blood pressure.  Lipid and cholesterol levels. These may be checked every 5 years, or more frequently if you are over 40 years old.  Skin check.  Lung cancer screening. You may have this screening every year starting at age 3 if you have a 30-pack-year history of smoking and currently smoke or have quit within the past 15 years.  Fecal occult blood test (FOBT) of the stool. You may have this test every year starting at age 30.  Flexible sigmoidoscopy or colonoscopy. You may have a sigmoidoscopy every 5 years or a colonoscopy every 10 years starting at age 72.  Hepatitis C blood test.  Hepatitis B blood test.  Sexually transmitted disease (STD) testing.  Diabetes screening. This is done by checking your blood sugar (glucose) after you have not eaten for a while (fasting). You may have this done every 1-3 years.  Bone density scan. This is done to screen for osteoporosis. You may have this done starting at age 44.  Mammogram. This may be done every 1-2 years. Talk to your health care provider about how often you should have regular mammograms. Talk with your health care provider about your test results, treatment options, and if necessary, the need for more tests. Vaccines  Your health care provider may recommend certain vaccines, such as:  Influenza vaccine.  This is recommended every year.  Tetanus, diphtheria, and acellular pertussis (Tdap, Td) vaccine. You may need a Td booster every 10 years.  Zoster vaccine. You may need this after age 47.  Pneumococcal 13-valent conjugate (PCV13) vaccine. One dose is recommended after age 28.  Pneumococcal polysaccharide  (PPSV23) vaccine. One dose is recommended after age 49. Talk to your health care provider about which screenings and vaccines you need and how often you need them. This information is not intended to replace advice given to you by your health care provider. Make sure you discuss any questions you have with your health care provider. Document Released: 05/28/2015 Document Revised: 01/19/2016 Document Reviewed: 03/02/2015 Elsevier Interactive Patient Education  2017 [place] Prevention in the Home Falls can cause injuries. They can happen to people of all ages. There are many things you can do to make your home safe and to help prevent falls. What can I do on the outside of my home?  Regularly fix the edges of walkways and driveways and fix any cracks.  Remove anything that might make you trip as you walk through a door, such as a raised step or threshold.  Trim any bushes or trees on the path to your home.  Use bright outdoor lighting.  Clear any walking paths of anything that might make someone trip, such as rocks or tools.  Regularly check to see if handrails are loose or broken. Make sure that both sides of any steps have handrails.  Any raised decks and porches should have guardrails on the edges.  Have any leaves, snow, or ice cleared regularly.  Use sand or salt on walking paths during winter.  Clean up any spills in your garage right away. This includes oil or grease spills. What can I do in the bathroom?  Use night lights.  Install grab bars by the toilet and in the tub and shower. Do not use towel bars as grab bars.  Use non-skid mats or decals in the tub or shower.  If you need to sit down in the shower, use a plastic, non-slip stool.  Keep the floor dry. Clean up any water that spills on the floor as soon as it happens.  Remove soap buildup in the tub or shower regularly.  Attach bath mats securely with double-sided non-slip rug tape.  Do not have  throw rugs and other things on the floor that can make you trip. What can I do in the bedroom?  Use night lights.  Make sure that you have a light by your bed that is easy to reach.  Do not use any sheets or blankets that are too big for your bed. They should not hang down onto the floor.  Have a firm chair that has side arms. You can use this for support while you get dressed.  Do not have throw rugs and other things on the floor that can make you trip. What can I do in the kitchen?  Clean up any spills right away.  Avoid walking on wet floors.  Keep items that you use a lot in easy-to-reach places.  If you need to reach something above you, use a strong step stool that has a grab bar.  Keep electrical cords out of the way.  Do not use floor polish or wax that makes floors slippery. If you must use wax, use non-skid floor wax.  Do not have throw rugs and other things on the floor that can make  you trip. What can I do with my stairs?  Do not leave any items on the stairs.  Make sure that there are handrails on both sides of the stairs and use them. Fix handrails that are broken or loose. Make sure that handrails are as long as the stairways.  Check any carpeting to make sure that it is firmly attached to the stairs. Fix any carpet that is loose or worn.  Avoid having throw rugs at the top or bottom of the stairs. If you do have throw rugs, attach them to the floor with carpet tape.  Make sure that you have a light switch at the top of the stairs and the bottom of the stairs. If you do not have them, ask someone to add them for you. What else can I do to help prevent falls?  Wear shoes that:  Do not have high heels.  Have rubber bottoms.  Are comfortable and fit you well.  Are closed at the toe. Do not wear sandals.  If you use a stepladder:  Make sure that it is fully opened. Do not climb a closed stepladder.  Make sure that both sides of the stepladder are  locked into place.  Ask someone to hold it for you, if possible.  Clearly mark and make sure that you can see:  Any grab bars or handrails.  First and last steps.  Where the edge of each step is.  Use tools that help you move around (mobility aids) if they are needed. These include:  Canes.  Walkers.  Scooters.  Crutches.  Turn on the lights when you go into a dark area. Replace any light bulbs as soon as they burn out.  Set up your furniture so you have a clear path. Avoid moving your furniture around.  If any of your floors are uneven, fix them.  If there are any pets around you, be aware of where they are.  Review your medicines with your doctor. Some medicines can make you feel dizzy. This can increase your chance of falling. Ask your doctor what other things that you can do to help prevent falls. This information is not intended to replace advice given to you by your health care provider. Make sure you discuss any questions you have with your health care provider. Document Released: 02/25/2009 Document Revised: 10/07/2015 Document Reviewed: 06/05/2014 Elsevier Interactive Patient Education  2017 [employer].

## 2019-10-28 ENCOUNTER — Telehealth: Payer: Self-pay

## 2019-10-28 NOTE — Telephone Encounter (Signed)
Patient was called and notified that the address given was "[place], [place], [place]" and she recited back "[place], [place], [place]". Patient understood and confirmed that she understand why point was deducted from score. Patient made aware that I stated she did "good" because she only made 1 tiny error. Patient states it's fine and understands.

## 2019-10-28 NOTE — Telephone Encounter (Signed)
Incoming call received from patient questioning her score on the 6 CIT yesterday.  Patient states Dinah told her she missed something yet Jasmine told her she did good.  Patient asked me what did she miss and I advised her that she missed 1 component of the address. Patient states she does not think that is documented arcuately. Towards the end of the conversation patient states I guess its possible that I missed 1 component of the address yet I thought I got everything right.  Patient would like for Jasmine to recall what component she missed or if it was documented incorrectly.

## 2019-10-29 ENCOUNTER — Other Ambulatory Visit: Payer: Self-pay

## 2019-10-29 ENCOUNTER — Non-Acute Institutional Stay: Payer: Medicare Other | Admitting: Internal Medicine

## 2019-10-29 ENCOUNTER — Encounter: Payer: Self-pay | Admitting: Internal Medicine

## 2019-10-29 VITALS — BP 142/80 | HR 73 | Temp 97.4°F | Ht 62.0 in | Wt 130.8 lb

## 2019-10-29 DIAGNOSIS — M545 Low back pain: Secondary | ICD-10-CM | POA: Diagnosis not present

## 2019-10-29 DIAGNOSIS — R600 Localized edema: Secondary | ICD-10-CM

## 2019-10-29 DIAGNOSIS — R635 Abnormal weight gain: Secondary | ICD-10-CM | POA: Diagnosis not present

## 2019-10-29 DIAGNOSIS — I10 Essential (primary) hypertension: Secondary | ICD-10-CM

## 2019-10-29 DIAGNOSIS — I872 Venous insufficiency (chronic) (peripheral): Secondary | ICD-10-CM | POA: Diagnosis not present

## 2019-10-29 DIAGNOSIS — R2681 Unsteadiness on feet: Secondary | ICD-10-CM | POA: Diagnosis not present

## 2019-10-29 NOTE — Progress Notes (Signed)
Location: [place] of Service:  Clinic (12)  Provider:   Code Status:  Goals of Care:  Advanced Directives 10/27/2019  Does Patient Have a Medical Advance Directive? Yes  Type of Advance Directive Living will;Healthcare Power of [place];Out of facility DNR (pink MOST or yellow form)  Does patient want to make changes to medical advance directive? No - Patient declined  Copy of [place] in Chart? Yes - validated most recent copy scanned in chart (See row information)  Pre-existing out of facility DNR order (yellow form or pink MOST form) -     Chief Complaint  Patient presents with  . Medical Management of Chronic Issues    Patient returns to the clinic to discuss her lab results and treatment plans.     HPI: Patient is a 86 y.o. female seen today for an acute visit for LE edema  Patient has h/o Hypertension, Hyperlipidemia, GERD, Inguinal Hernia  LE edema Seen Initially by Manxi started on Lasix 20 mg QD I decreased the dose to 20 mg 3/week Doing well with swelling Wants to know if it is ok to just take it 3/week Also using Ted hoses Continues to have some mild swelling No SOB or PND Weight gain Has slowly gained weight over past Few years. Wants to know if it is related to Fluid   Past Medical History:  Diagnosis Date  . Cervicalgia   . Chest pain at rest 01/07/2012  . Displacement of cervical intervertebral disc without myelopathy   . Diverticulosis of colon (without mention of hemorrhage)   . Female stress incontinence   . GERD (gastroesophageal reflux disease)    no peds occasional pepcid  . Inguinal hernia without mention of obstruction or gangrene, unilateral or unspecified, (not specified as recurrent)   . Internal hemorrhoids without mention of complication   . Lumbago   . Macular degeneration (senile) of retina, unspecified   . Osteoarthrosis, unspecified whether generalized or localized, unspecified site   . Other  and unspecified hyperlipidemia   . Pain in joint, hand   . Palpitations   . Reflux esophagitis   . Scoliosis (and kyphoscoliosis), idiopathic   . Senile osteoporosis   . Skin disorder   . Thyroid disease   . TIA (transient ischemic attack)   . Unruptured popliteal cyst 06/24/2014   Right knee   . Unspecified essential hypertension   . Unspecified glaucoma(365.9)   . Unspecified hypothyroidism   . Unspecified vitamin D deficiency     Past Surgical History:  Procedure Laterality Date  . ABDOMINAL HYSTERECTOMY  1975   Dr Mallie Mussel  . APPENDECTOMY  1988  . cardiolite myocardial perfusion study    . DOPPLER ECHOCARDIOGRAPHY    . EYE SURGERY Bilateral 2009   cataract removed right eye, Dr Charise Killian  . FOOT SURGERY  august 2013   Doran Durand, MD  . INGUINAL HERNIA REPAIR Bilateral    w/mesh  . INGUINAL HERNIA REPAIR Left 08/03/2017   Procedure: LAPAROSCOPIC LEFT INGUINAL HERNIA REPAIR WITH MESH;  Surgeon: Greer Pickerel, MD;  Location: [facility];  Service: General;  Laterality: Left;  . INGUINAL HERNIA REPAIR Right 08/03/2017   Procedure: HERNIA REPAIR RIGHT INGUINAL ADULT WITH MESH;  Surgeon: Greer Pickerel, MD;  Location: [facility];  Service: General;  Laterality: Right;  . INGUINAL HERNIA REPAIR     Dr. Redmond Pulling 04-24-18  . INGUINAL HERNIA REPAIR Right 04/24/2018   Procedure: DIAGNOSTIC LAPAROSCOPIC, OPENREPAIR OF RECURRENT RIGHT INGUINAL HERNIA  WITH MESH ERAS PATHWAY;  Surgeon: Greer Pickerel, MD;  Location: [facility];  Service: General;  Laterality: Right;  . INSERTION OF MESH Bilateral 08/03/2017   Procedure: INSERTION OF MESH;  Surgeon: Greer Pickerel, MD;  Location: [facility];  Service: General;  Laterality: Bilateral;  . NM MYOVIEW LTD     negative  . TONSILLECTOMY  1937  . TOOTH EXTRACTION  09/16/13   Dr Carlos American    Allergies  Allergen Reactions  . Trimethoprim Other (See Comments)    Headaches/ ear pressure   . Erythromycin Other (See Comments)    UNSPECIFIED REACTION   . Macrodantin [Nitrofurantoin  Macrocrystal] Other (See Comments)    UNSPECIFIED REACTION   . Penicillins Other (See Comments)    UNSPECIFIED REACTION  Has patient had a PCN reaction causing immediate rash, facial/tongue/throat swelling, SOB or lightheadedness with hypotension: Unknown Has patient had a PCN reaction causing severe rash involving mucus membranes or skin necrosis: Unknown Has patient had a PCN reaction that required hospitalization: No Has patient had a PCN reaction occurring within the last 10 years: No If all of the above answers are "NO", then may proceed with Cephalosporin use.  . Sulfa Antibiotics Other (See Comments)    UNSPECIFIED REACTION   . Latex Itching    Outpatient Encounter Medications as of 10/29/2019  Medication Sig  . acetaminophen (TYLENOL) 500 MG tablet Take 500 mg by mouth 2 (two) times daily as needed for mild pain or headache.   . bimatoprost (LUMIGAN) 0.01 % SOLN Place 1 drop into both eyes at bedtime.  . Calcium Carbonate-Vitamin D 600-400 MG-UNIT chew tablet Chew 1 tablet by mouth 2 (two) times daily.   . camphor-menthol (SARNA) lotion Apply 1 application topically as needed for itching. Prescribed by Dermatology at [facility]  . Cholecalciferol (VITAMIN D3) 50 MCG (2000 UT) TABS Take by mouth.  . clopidogrel (PLAVIX) 75 MG tablet TAKE 1 TABLET ONCE A DAY TO PREVENT STROKE.  Marland Kitchen CRANBERRY EXTRACT PO Take 1 tablet by mouth daily.  . famotidine (PEPCID AC) 10 MG chewable tablet Chew 10 mg by mouth at bedtime as needed for heartburn.  . furosemide (LASIX) 20 MG tablet Take 20 mg by mouth 3 (three) times a week.  . hydrocortisone cream 1 % Apply 1 application topically daily as needed for itching.   . metoprolol succinate (TOPROL-XL) 25 MG 24 hr tablet TAKE 1 TABLET ONCE DAILY TO REGULATE HEART AND CONTROL BLOOD PRESSURE.  . Multiple Vitamins-Minerals (PRESERVISION AREDS 2 PO) Take 1 capsule by mouth 2 (two) times daily.   Vladimir Faster Glycol-Propyl Glycol (SYSTANE ULTRA) 0.4-0.3 %  SOLN Place 1 vial into both eyes 2 (two) times daily. May use another dose during the day as needed for dry eye   . potassium chloride SA (KLOR-CON) 20 MEQ tablet Take 20 mEq by mouth 3 (three) times a week. With Lasix  . psyllium (METAMUCIL) 58.6 % packet Take 1 packet by mouth daily. Mix with water  . vitamin C (ASCORBIC ACID) 500 MG tablet Take 500 mg by mouth daily with supper.    No facility-administered encounter medications on file as of 10/29/2019.    Review of Systems:  Review of Systems  Constitutional: Positive for activity change and appetite change.  HENT: Negative.   Respiratory: Negative.   Cardiovascular: Positive for leg swelling.  Gastrointestinal: Negative.   Genitourinary: Negative.   Musculoskeletal: Negative.   Skin: Negative.   Neurological: Negative.   Psychiatric/Behavioral: Negative.  Health Maintenance  Topic Date Due  . INFLUENZA VACCINE  12/14/2019  . TETANUS/TDAP  03/20/2020  . MAMMOGRAM  05/13/2020  . DEXA SCAN  Completed  . COVID-19 Vaccine  Completed  . PNA vac Low Risk Adult  Completed    Physical Exam: Vitals:   10/29/19 1440  BP: (!) 142/80  Pulse: 73  Temp: (!) 97.4 F (36.3 C)  SpO2: 97%  Weight: 130 lb 12.8 oz (59.3 kg)  Height: 5\' 2"  (1.575 m)   Body mass index is 23.92 kg/m. Physical Exam  Constitutional: Oriented to person, place, and time. Well-developed and well-nourished.  HENT:  Head: Normocephalic.  Mouth/Throat: Oropharynx is clear and moist.  Eyes: Pupils are equal, round, and reactive to light.  Neck: Neck supple.  Cardiovascular: Normal rate and normal heart sounds.  No murmur heard. Pulmonary/Chest: Effort normal and breath sounds normal. No respiratory distress. No wheezes. She has no rales.  Abdominal: Soft. Bowel sounds are normal. No distension. There is no tenderness. There is no rebound.  Musculoskeletal: Mild Edema Bilateral Lymphadenopathy: none Neurological: Alert and oriented to person, place,  and time.  Walks with the Cane Skin: Skin is warm and dry.  Psychiatric: Normal mood and affect. Behavior is normal. Thought content normal.    Labs reviewed: Basic Metabolic Panel: Recent Labs    07/10/19 0916 09/25/19 0846  NA 139 138  K 4.0 4.2  CL 103 103  CO2 28 28  GLUCOSE 83 77  BUN 26* 32*  CREATININE 0.70 0.78  CALCIUM 9.3 9.4   Liver Function Tests: Recent Labs    07/10/19 0916 09/25/19 0846  AST 18 18  ALT 13 14  BILITOT 0.4 0.4  PROT 6.3 6.5   No results for input(s): LIPASE, AMYLASE in the last 8760 hours. No results for input(s): AMMONIA in the last 8760 hours. CBC: Recent Labs    07/10/19 0916  WBC 6.8  NEUTROABS 3,781  HGB 12.4  HCT 37.3  MCV 89.9  PLT 229   Lipid Panel: Recent Labs    07/10/19 0916  CHOL 188  HDL 75  LDLCALC 94  TRIG 96  CHOLHDL 2.5   Lab Results  Component Value Date   HGBA1C 5.3 05/02/2013    Procedures since last visit: No results found.  Assessment/Plan LE edema  Doing well with Lasix 20 mg 3/week Continue to work with Therapy Also doing Ted hoses Can consider Echo if any worsening  Follow up with Dr Mariea Clonts BUN and Creat are stable Weight gain Most likely due to Lack of exercise It seems she has gained weight over past few years Talked about Portion control in Lunch room Essential hypertension Much better today On Toprol Inguinal Hernia D/W surgeon and he has told her to wear Belt which she thinks has helped Not candidate for Surgery  Discussed everything with Daughter on the Phone  She will follow with Dr Mariea Clonts for her regular visit   Labs/tests ordered:  * No order type specified * Next appt:  11/13/2019  Total time spent in this patient care encounter was  45_  minutes; greater than 50% of the visit spent counseling patient and staff, reviewing records , Labs and coordinating care for problems addressed at this encounter.

## 2019-10-31 DIAGNOSIS — M545 Low back pain: Secondary | ICD-10-CM | POA: Diagnosis not present

## 2019-10-31 DIAGNOSIS — R2681 Unsteadiness on feet: Secondary | ICD-10-CM | POA: Diagnosis not present

## 2019-11-04 DIAGNOSIS — R2681 Unsteadiness on feet: Secondary | ICD-10-CM | POA: Diagnosis not present

## 2019-11-04 DIAGNOSIS — M545 Low back pain: Secondary | ICD-10-CM | POA: Diagnosis not present

## 2019-11-10 DIAGNOSIS — R2681 Unsteadiness on feet: Secondary | ICD-10-CM | POA: Diagnosis not present

## 2019-11-10 DIAGNOSIS — M545 Low back pain: Secondary | ICD-10-CM | POA: Diagnosis not present

## 2019-11-12 DIAGNOSIS — R2681 Unsteadiness on feet: Secondary | ICD-10-CM | POA: Diagnosis not present

## 2019-11-12 DIAGNOSIS — M545 Low back pain: Secondary | ICD-10-CM | POA: Diagnosis not present

## 2019-11-13 ENCOUNTER — Encounter: Payer: Self-pay | Admitting: Internal Medicine

## 2019-11-13 ENCOUNTER — Other Ambulatory Visit: Payer: Self-pay

## 2019-11-13 ENCOUNTER — Ambulatory Visit (INDEPENDENT_AMBULATORY_CARE_PROVIDER_SITE_OTHER): Payer: Medicare Other | Admitting: Internal Medicine

## 2019-11-13 VITALS — BP 138/82 | HR 63 | Temp 97.5°F | Ht 62.0 in | Wt 128.0 lb

## 2019-11-13 DIAGNOSIS — R635 Abnormal weight gain: Secondary | ICD-10-CM

## 2019-11-13 DIAGNOSIS — I10 Essential (primary) hypertension: Secondary | ICD-10-CM | POA: Diagnosis not present

## 2019-11-13 DIAGNOSIS — I872 Venous insufficiency (chronic) (peripheral): Secondary | ICD-10-CM

## 2019-11-13 DIAGNOSIS — Z66 Do not resuscitate: Secondary | ICD-10-CM | POA: Diagnosis not present

## 2019-11-13 NOTE — Progress Notes (Signed)
Location:  [facility] clinic Provider:  Chandell Attridge L. Mariea Clonts, D.O., C.M.D.  Code Status: DNR Goals of Care:  Advanced Directives 11/13/2019  Does Patient Have a Medical Advance Directive? Yes  Type of Paramedic of [place];Living will;Out of facility DNR (pink MOST or yellow form)  Does patient want to make changes to medical advance directive? No - Patient declined  Copy of [place] in Chart? Yes - validated most recent copy scanned in chart (See row information)  Pre-existing out of facility DNR order (yellow form or pink MOST form) Pink MOST/Yellow Form most recent copy in chart - Physician notified to receive inpatient order     Chief Complaint  Patient presents with  . Medical Management of Chronic Issues    4 month follow up     HPI: Patient is a 86 y.o. female seen today for medical management of chronic diseases.    She has venous insufficiency:  She'd had labs and got put on furosemide and potassium.  She's been elevating her feet and using compression stockings with zippers.  She'd worn a pair when she traveled to [place] a few years ago.  She used that pair and ordered more of them but they were tighter and she could not get the new ones on.  Says the zipper ones are comfortable.  She asks if it's a long-term thing--yes.    She is trying to lose weight b/c her clothes have gotten tight.  She wants to be a perfect 10 again.  She had to buy a few 12s.  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  She is taking leftovers with her or leaving it.  Wants to get back to 125 lbs or less again.  She lost a few earrings in the pandemic.  One closed up.  Then she quit wearing the other one.  She wants to get them repierced.  She has a wedge to elevate her feet.  She has that from a prior foot surgery.  It's fairly comfortable.    Past Medical History:  Diagnosis Date  . Cervicalgia   . Chest pain at rest 01/07/2012  . Displacement of cervical  intervertebral disc without myelopathy   . Diverticulosis of colon (without mention of hemorrhage)   . Female stress incontinence   . GERD (gastroesophageal reflux disease)    no peds occasional pepcid  . Inguinal hernia without mention of obstruction or gangrene, unilateral or unspecified, (not specified as recurrent)   . Internal hemorrhoids without mention of complication   . Lumbago   . Macular degeneration (senile) of retina, unspecified   . Osteoarthrosis, unspecified whether generalized or localized, unspecified site   . Other and unspecified hyperlipidemia   . Pain in joint, hand   . Palpitations   . Reflux esophagitis   . Scoliosis (and kyphoscoliosis), idiopathic   . Senile osteoporosis   . Skin disorder   . Thyroid disease   . TIA (transient ischemic attack)   . Unruptured popliteal cyst 06/24/2014   Right knee   . Unspecified essential hypertension   . Unspecified glaucoma(365.9)   . Unspecified hypothyroidism   . Unspecified vitamin D deficiency     Past Surgical History:  Procedure Laterality Date  . ABDOMINAL HYSTERECTOMY  1975   Dr Mallie Mussel  . APPENDECTOMY  1988  . cardiolite myocardial perfusion study    . DOPPLER ECHOCARDIOGRAPHY    . EYE SURGERY Bilateral 2009   cataract removed right eye,  Dr Charise Killian  . FOOT SURGERY  august 2013   Doran Durand, MD  . INGUINAL HERNIA REPAIR Bilateral    w/mesh  . INGUINAL HERNIA REPAIR Left 08/03/2017   Procedure: LAPAROSCOPIC LEFT INGUINAL HERNIA REPAIR WITH MESH;  Surgeon: Greer Pickerel, MD;  Location: [facility];  Service: General;  Laterality: Left;  . INGUINAL HERNIA REPAIR Right 08/03/2017   Procedure: HERNIA REPAIR RIGHT INGUINAL ADULT WITH MESH;  Surgeon: Greer Pickerel, MD;  Location: [facility];  Service: General;  Laterality: Right;  . INGUINAL HERNIA REPAIR     Dr. Redmond Pulling 04-24-18  . INGUINAL HERNIA REPAIR Right 04/24/2018   Procedure: DIAGNOSTIC LAPAROSCOPIC, OPENREPAIR OF RECURRENT RIGHT INGUINAL HERNIA WITH MESH ERAS PATHWAY;   Surgeon: Greer Pickerel, MD;  Location: [facility];  Service: General;  Laterality: Right;  . INSERTION OF MESH Bilateral 08/03/2017   Procedure: INSERTION OF MESH;  Surgeon: Greer Pickerel, MD;  Location: [facility];  Service: General;  Laterality: Bilateral;  . NM MYOVIEW LTD     negative  . TONSILLECTOMY  1937  . TOOTH EXTRACTION  09/16/13   Dr Carlos American    Allergies  Allergen Reactions  . Trimethoprim Other (See Comments)    Headaches/ ear pressure   . Erythromycin Other (See Comments)    UNSPECIFIED REACTION   . Macrodantin [Nitrofurantoin Macrocrystal] Other (See Comments)    UNSPECIFIED REACTION   . Penicillins Other (See Comments)    UNSPECIFIED REACTION  Has patient had a PCN reaction causing immediate rash, facial/tongue/throat swelling, SOB or lightheadedness with hypotension: Unknown Has patient had a PCN reaction causing severe rash involving mucus membranes or skin necrosis: Unknown Has patient had a PCN reaction that required hospitalization: No Has patient had a PCN reaction occurring within the last 10 years: No If all of the above answers are "NO", then may proceed with Cephalosporin use.  . Sulfa Antibiotics Other (See Comments)    UNSPECIFIED REACTION   . Latex Itching    Outpatient Encounter Medications as of 11/13/2019  Medication Sig  . acetaminophen (TYLENOL) 500 MG tablet Take 500 mg by mouth 2 (two) times daily as needed for mild pain or headache.   . bimatoprost (LUMIGAN) 0.01 % SOLN Place 1 drop into both eyes at bedtime.  . Calcium Carbonate-Vitamin D 600-400 MG-UNIT chew tablet Chew 1 tablet by mouth 2 (two) times daily.   . camphor-menthol (SARNA) lotion Apply 1 application topically as needed for itching. Prescribed by Dermatology at [facility]  . Cholecalciferol (VITAMIN D3) 50 MCG (2000 UT) TABS Take by mouth.  . clopidogrel (PLAVIX) 75 MG tablet TAKE 1 TABLET ONCE A DAY TO PREVENT STROKE.  Marland Kitchen CRANBERRY EXTRACT PO Take 1 tablet by mouth daily.  . famotidine  (PEPCID AC) 10 MG chewable tablet Chew 10 mg by mouth at bedtime as needed for heartburn.  . furosemide (LASIX) 20 MG tablet Take 20 mg by mouth 3 (three) times a week.  . hydrocortisone cream 1 % Apply 1 application topically daily as needed for itching.   . metoprolol succinate (TOPROL-XL) 25 MG 24 hr tablet TAKE 1 TABLET ONCE DAILY TO REGULATE HEART AND CONTROL BLOOD PRESSURE.  . Multiple Vitamins-Minerals (PRESERVISION AREDS 2 PO) Take 1 capsule by mouth 2 (two) times daily.   Vladimir Faster Glycol-Propyl Glycol (SYSTANE ULTRA) 0.4-0.3 % SOLN Place 1 vial into both eyes 2 (two) times daily. May use another dose during the day as needed for dry eye   . potassium chloride SA (KLOR-CON) 20 MEQ tablet  Take 20 mEq by mouth 3 (three) times a week. With Lasix  . psyllium (METAMUCIL) 58.6 % packet Take 1 packet by mouth daily. Mix with water  . vitamin C (ASCORBIC ACID) 500 MG tablet Take 500 mg by mouth daily with supper.    No facility-administered encounter medications on file as of 11/13/2019.    Review of Systems:  Review of Systems  Constitutional: Positive for weight loss. Negative for chills, fever and malaise/fatigue.  HENT: Negative for congestion.   Eyes: Negative for blurred vision.  Respiratory: Negative for cough.   Cardiovascular: Positive for leg swelling. Negative for chest pain, palpitations, orthopnea, claudication and PND.       Edema improved with lasix and potassium and compression socks  Gastrointestinal: Negative for abdominal pain and constipation.  Genitourinary: Negative for dysuria.  Musculoskeletal: Negative for falls.  Skin: Negative for itching and rash.  Neurological: Negative for dizziness and loss of consciousness.  Endo/Heme/Allergies: Bruises/bleeds easily.  Psychiatric/Behavioral: Negative for depression and memory loss. The patient is not nervous/anxious.     Health Maintenance  Topic Date Due  . INFLUENZA VACCINE  12/14/2019  . TETANUS/TDAP  03/20/2020    . MAMMOGRAM  05/13/2020  . DEXA SCAN  Completed  . COVID-19 Vaccine  Completed  . PNA vac Low Risk Adult  Completed    Physical Exam: Vitals:   11/13/19 1116  BP: 138/82  Pulse: 63  Temp: (!) 97.5 F (36.4 C)  TempSrc: Temporal  SpO2: 96%  Weight: 128 lb (58.1 kg)  Height: 5\' 2"  (1.575 m)   Body mass index is 23.41 kg/m. Physical Exam Vitals reviewed.  Constitutional:      General: She is not in acute distress.    Appearance: Normal appearance. She is not toxic-appearing.  HENT:     Head: Normocephalic and atraumatic.  Eyes:     Comments: glasses  Cardiovascular:     Rate and Rhythm: Normal rate and regular rhythm.  Pulmonary:     Effort: Pulmonary effort is normal.     Breath sounds: Normal breath sounds. No wheezing, rhonchi or rales.  Abdominal:     General: Bowel sounds are normal.     Palpations: Abdomen is soft.     Tenderness: There is no abdominal tenderness.  Musculoskeletal:        General: Normal range of motion.     Cervical back: Neck supple.     Comments: Edema much improved with lasix, potassium and compression socks on--zipped variety  Skin:    General: Skin is warm and dry.     Comments: Hyperpigmentation from venous insufficiency in lower legs and feet  Neurological:     Mental Status: She is alert.     Labs reviewed: Basic Metabolic Panel: Recent Labs    07/10/19 0916 09/25/19 0846  NA 139 138  K 4.0 4.2  CL 103 103  CO2 28 28  GLUCOSE 83 77  BUN 26* 32*  CREATININE 0.70 0.78  CALCIUM 9.3 9.4   Liver Function Tests: Recent Labs    07/10/19 0916 09/25/19 0846  AST 18 18  ALT 13 14  BILITOT 0.4 0.4  PROT 6.3 6.5   No results for input(s): LIPASE, AMYLASE in the last 8760 hours. No results for input(s): AMMONIA in the last 8760 hours. CBC: Recent Labs    07/10/19 0916  WBC 6.8  NEUTROABS 3,781  HGB 12.4  HCT 37.3  MCV 89.9  PLT 229   Lipid Panel: Recent Labs  07/10/19 0916  CHOL 188  HDL 75  LDLCALC 94   TRIG 96  CHOLHDL 2.5   Lab Results  Component Value Date   HGBA1C 5.3 05/02/2013    Procedures since last visit: No results found.  Assessment/Plan 1. Chronic venous insufficiency -continue compression hose -agreed to allow her to do a trial w/o lasix and potassium since we are not aware of any chf and this was for her edema -trial is about one week--f/u next thurs or fri to reassess edema (has not had sob, rales, other evidence of fluid weight gain) -if trial not a success, would check BMP, BNP, echo and resume three times weekly lasix 20mg  and potassium 53meq  2. Essential hypertension -bp is controlled, monitor when off lasix  3. Weight gain -this appears to be due to her eating habits--she's gained since her move to [place] and knows she was part of the clean the plate club and is doing better with that and down three lbs -we also discussed postural changes from kyphosis/osteoporosis affecting the appearance of abdominal distention  4. DNR (do not resuscitate) - Do not attempt resuscitation (DNR) order reentered ([facility] is on file)  Labs/tests ordered:  No new placed today--needs BMP next week  Next appt:  03/15/2020  Brewer Hitchman L. Joelle Roswell, D.O. [facility] [address], [place] [address] Cell Phone (Mon-Fri 8am-5pm):  [phone] On Call:  [phone] & follow prompts after 5pm & weekends Office Phone:  [phone] Office Fax:  [phone]

## 2019-11-13 NOTE — Patient Instructions (Addendum)
Try stopping furosemide and potassium. Follow-up in one week with NP here to see if leg swelling remains ok with just compression hose.

## 2019-11-19 DIAGNOSIS — M545 Low back pain: Secondary | ICD-10-CM | POA: Diagnosis not present

## 2019-11-19 DIAGNOSIS — M6281 Muscle weakness (generalized): Secondary | ICD-10-CM | POA: Diagnosis not present

## 2019-11-21 ENCOUNTER — Other Ambulatory Visit: Payer: Self-pay

## 2019-11-21 ENCOUNTER — Ambulatory Visit (INDEPENDENT_AMBULATORY_CARE_PROVIDER_SITE_OTHER): Payer: Medicare Other | Admitting: Family

## 2019-11-21 ENCOUNTER — Encounter: Payer: Self-pay | Admitting: Family

## 2019-11-21 VITALS — BP 120/70 | HR 71 | Temp 97.1°F | Resp 16 | Ht 62.0 in | Wt 127.6 lb

## 2019-11-21 DIAGNOSIS — R600 Localized edema: Secondary | ICD-10-CM | POA: Diagnosis not present

## 2019-11-21 NOTE — Progress Notes (Signed)
Provider: Mekhi Lascola FNP-C  Gayland Curry, DO  Patient Care Team: Gayland Curry, DO as PCP - General (Geriatric Medicine) Lorretta Harp, MD as PCP - Cardiology (Cardiology) Laurence Spates, MD (Inactive) as Consulting Physician (Gastroenterology) Myrlene Broker, MD as Attending Physician (Urology) Lorretta Harp, MD as Consulting Physician (Cardiology) Rozetta Nunnery, MD as Consulting Physician (Otolaryngology) Richarda Osmond, [place] (Dentistry) Shon Hough, MD as Consulting Physician (Ophthalmology) Iran Planas, MD as Consulting Physician (Orthopedic Surgery) Greer Pickerel, MD as Consulting Physician (General Surgery)  Extended Emergency Contact Information Primary Emergency Contact: [name] Address: [address]          [place], [place] [address] [name] of [place] Phone: [phone] Work Phone: [phone] Mobile Phone: [phone] Relation: Daughter Secondary Emergency Contact: Arvin Collard Address: [address] [place] [address] [name] of [place] Phone: [phone] Mobile Phone: [phone] Relation: Son  Code Status:  DNR Goals of care: Advanced Directive information Advanced Directives 11/21/2019  Does Patient Have a Medical Advance Directive? Yes  Type of Advance Directive Out of facility DNR (pink MOST or yellow form);Living will;Healthcare Power of Attorney  Does patient want to make changes to medical advance directive? No - Patient declined  Copy of [place] in Chart? Yes - validated most recent copy scanned in chart (See row information)  Pre-existing out of facility DNR order (yellow form or pink MOST form) -     Chief Complaint  Patient presents with   Follow-up    Follow up for both legs swelling.     HPI:  Pt is a 86 y.o. female seen today for an acute visit for follow up bilateral legs swelling.she saw Dr.Reed 11/13/2019 was advised trial off lasix  and potassium for one week.she was previously on Furosemide 20 mg tablet three times per week along potassium 20 meq.she tries to choose foods low in sodium.Eats more in the facility dinning room. Fell yesterday getting off the shower.she tried to pull the towel but didn't come loose so she lost controlled.she sat down on the towel.she didn't hit her head.she denies any pain.Facility Nurse checked her vital signs were stable.    Past Medical History:  Diagnosis Date   Cervicalgia    Chest pain at rest 01/07/2012   Displacement of cervical intervertebral disc without myelopathy    Diverticulosis of colon (without mention of hemorrhage)    Female stress incontinence    GERD (gastroesophageal reflux disease)    no peds occasional pepcid   Inguinal hernia without mention of obstruction or gangrene, unilateral or unspecified, (not specified as recurrent)    Internal hemorrhoids without mention of complication    Lumbago    Macular degeneration (senile) of retina, unspecified    Osteoarthrosis, unspecified whether generalized or localized, unspecified site    Other and unspecified hyperlipidemia    Pain in joint, hand    Palpitations    Reflux esophagitis    Scoliosis (and kyphoscoliosis), idiopathic    Senile osteoporosis    Skin disorder    Thyroid disease    TIA (transient ischemic attack)    Unruptured popliteal cyst 06/24/2014   Right knee    Unspecified essential hypertension    Unspecified glaucoma(365.9)    Unspecified hypothyroidism    Unspecified vitamin D deficiency    Past Surgical History:  Procedure Laterality Date   ABDOMINAL HYSTERECTOMY  1975   Dr Mallie Mussel   APPENDECTOMY  1988   cardiolite myocardial perfusion study     DOPPLER ECHOCARDIOGRAPHY     EYE SURGERY Bilateral 2009   cataract removed right eye, Dr Charise Killian   FOOT SURGERY  august 2013   Hewitt, MD   INGUINAL HERNIA REPAIR Bilateral    w/mesh   INGUINAL HERNIA REPAIR Left  08/03/2017   Procedure: LAPAROSCOPIC LEFT INGUINAL HERNIA REPAIR WITH MESH;  Surgeon: Greer Pickerel, MD;  Location: [facility];  Service: General;  Laterality: Left;   INGUINAL HERNIA REPAIR Right 08/03/2017   Procedure: HERNIA REPAIR RIGHT INGUINAL ADULT WITH MESH;  Surgeon: Greer Pickerel, MD;  Location: [facility];  Service: General;  Laterality: Right;   INGUINAL HERNIA REPAIR     Dr. Redmond Pulling 04-24-18   INGUINAL HERNIA REPAIR Right 04/24/2018   Procedure: DIAGNOSTIC LAPAROSCOPIC, OPENREPAIR OF RECURRENT RIGHT INGUINAL HERNIA WITH MESH ERAS PATHWAY;  Surgeon: Greer Pickerel, MD;  Location: [facility];  Service: General;  Laterality: Right;   INSERTION OF MESH Bilateral 08/03/2017   Procedure: INSERTION OF MESH;  Surgeon: Greer Pickerel, MD;  Location: [facility];  Service: General;  Laterality: Bilateral;   NM MYOVIEW LTD     negative   TONSILLECTOMY  1937   TOOTH EXTRACTION  09/16/13   Dr Carlos American    Allergies  Allergen Reactions   Trimethoprim Other (See Comments)    Headaches/ ear pressure    Erythromycin Other (See Comments)    UNSPECIFIED REACTION    Macrodantin [Nitrofurantoin Macrocrystal] Other (See Comments)    UNSPECIFIED REACTION    Penicillins Other (See Comments)    UNSPECIFIED REACTION  Has patient had a PCN reaction causing immediate rash, facial/tongue/throat swelling, SOB or lightheadedness with hypotension: Unknown Has patient had a PCN reaction causing severe rash involving mucus membranes or skin necrosis: Unknown Has patient had a PCN reaction that required hospitalization: No Has patient had a PCN reaction occurring within the last 10 years: No If all of the above answers are "NO", then may proceed with Cephalosporin use.   Sulfa Antibiotics Other (See Comments)    UNSPECIFIED REACTION    Latex Itching    Outpatient Encounter Medications as of 11/21/2019  Medication Sig   acetaminophen (TYLENOL) 500 MG tablet Take 500 mg by mouth 2 (two) times daily as needed for mild pain  or headache.    bimatoprost (LUMIGAN) 0.01 % SOLN Place 1 drop into both eyes at bedtime.   Calcium Carbonate-Vitamin D 600-400 MG-UNIT chew tablet Chew 1 tablet by mouth 2 (two) times daily.    camphor-menthol (SARNA) lotion Apply 1 application topically as needed for itching. Prescribed by [facility]ermatology at [facility]   Cholecalciferol (VITAMIN D3) 50 MCG (2000 UT) TABS Take by mouth.   clopidogrel (PLAVIX) 75 MG tablet TAKE 1 TABLET ONCE A DAY TO PREVENT STROKE.   CRANBERRY EXTRACT PO Take 1 tablet by mouth daily.   famotidine (PEPCID AC) 10 MG chewable tablet Chew 10 mg by mouth at bedtime as needed for heartburn.   furosemide (LASIX) 20 MG tablet Take 20 mg by mouth 3 (three) times a week.   hydrocortisone cream 1 % Apply 1 application topically daily as needed for itching.    metoprolol succinate (TOPROL-XL) 25 MG 24 hr tablet TAKE 1 TABLET ONCE DAILY TO REGULATE HEART AND CONTROL BLOOD PRESSURE.   Multiple Vitamins-Minerals (PRESERVISION AREDS 2 PO) Take 1 capsule by mouth 2 (two) times daily.    Polyethyl Glycol-Propyl Glycol (SYSTANE ULTRA) 0.4-0.3 % SOLN Place 1 vial into both  eyes 2 (two) times daily. May use another dose during the day as needed for dry eye    potassium chloride SA (KLOR-CON) 20 MEQ tablet Take 20 mEq by mouth 3 (three) times a week. With Lasix   psyllium (METAMUCIL) 58.6 % packet Take 1 packet by mouth daily. Mix with water   vitamin C (ASCORBIC ACID) 500 MG tablet Take 500 mg by mouth daily with supper.    No facility-administered encounter medications on file as of 11/21/2019.    Review of Systems  Constitutional: Negative for appetite change, chills, fatigue and fever.  Eyes: Negative for discharge, redness and itching.  Respiratory: Negative for cough, choking, chest tightness, shortness of breath and wheezing.   Cardiovascular: Positive for leg swelling. Negative for chest pain and palpitations.  Gastrointestinal: Negative for abdominal  distention, abdominal pain, constipation, diarrhea, nausea and vomiting.  Genitourinary: Negative for difficulty urinating, dysuria, flank pain, frequency and urgency.  Musculoskeletal: Positive for gait problem. Negative for joint swelling.  Skin: Negative for color change and pallor.  Neurological: Negative for dizziness, speech difficulty, weakness, light-headedness, numbness and headaches.  Psychiatric/Behavioral: Negative for agitation, confusion and sleep disturbance. The patient is not nervous/anxious.     Immunization History  Administered Date(s) Administered   Fluad Quad(high Dose 65+) 02/04/2019   Influenza Whole 02/24/2011, 02/12/2012   Influenza, High Dose Seasonal PF 02/01/2017, 02/25/2018   Influenza,inj,Quad PF,6+ Mos 02/20/2013, 02/10/2014, 02/19/2015, 02/18/2016   Moderna SARS-COVID-2 Vaccination 05/19/2019, 06/16/2019   Pneumococcal Conjugate-13 06/24/2014   Pneumococcal Polysaccharide-23 04/12/1998   Td 02/21/1996, 07/23/2003   Tdap 03/20/2010   Zoster 09/15/2005   Pertinent  Health Maintenance Due  Topic Date Due   INFLUENZA VACCINE  12/14/2019   MAMMOGRAM  05/13/2020   DEXA SCAN  Completed   PNA vac Low Risk Adult  Completed   Fall Risk  11/21/2019 11/13/2019 10/29/2019 10/27/2019 10/01/2019  Falls in the past year? 1 0 0 1 0  Number falls in past yr: 0 0 0 0 0  Comment - - - - -  Injury with Fall? 0 0 - 0 -  Risk for fall due to : - - - - -  Follow up - - - - -    Vitals:   11/21/19 1055  BP: 120/70  Pulse: 71  Resp: 16  Temp: (!) 97.1 F (36.2 C)  SpO2: 97%  Weight: 127 lb 9.6 oz (57.9 kg)  Height: 5\' 2"  (1.575 m)   Body mass index is 23.34 kg/m. Physical Exam Vitals reviewed.  Constitutional:      General: She is not in acute distress.    Appearance: She is normal weight. She is not ill-appearing.  HENT:     Head: Normocephalic.     Right Ear: Tympanic membrane, ear canal and external ear normal. There is no impacted cerumen.      Left Ear: Tympanic membrane, ear canal and external ear normal. There is no impacted cerumen.     Nose: Nose normal. No congestion or rhinorrhea.     Mouth/Throat:     Mouth: Mucous membranes are moist.     Pharynx: Oropharynx is clear. No oropharyngeal exudate.  Eyes:     General: No scleral icterus.       Right eye: No discharge.        Left eye: No discharge.     Extraocular Movements: Extraocular movements intact.     Conjunctiva/sclera: Conjunctivae normal.     Pupils: Pupils are equal, round, and reactive  to light.  Neck:     Vascular: No carotid bruit.  Cardiovascular:     Rate and Rhythm: Normal rate and regular rhythm.     Pulses: Normal pulses.     Heart sounds: Normal heart sounds. No murmur heard.  No friction rub. No gallop.   Pulmonary:     Effort: Pulmonary effort is normal. No respiratory distress.     Breath sounds: Normal breath sounds. No wheezing, rhonchi or rales.  Chest:     Chest wall: No tenderness.  Abdominal:     General: Bowel sounds are normal. There is no distension.     Palpations: Abdomen is soft. There is no mass.     Tenderness: There is no abdominal tenderness. There is no right CVA tenderness, left CVA tenderness, guarding or rebound.  Musculoskeletal:        General: No swelling or tenderness. Normal range of motion.     Cervical back: Normal range of motion. No rigidity or tenderness.     Comments: Bilateral lower extremities trace edema.   Lymphadenopathy:     Cervical: No cervical adenopathy.  Skin:    General: Skin is warm and dry.     Coloration: Skin is not pale.     Findings: No bruising, erythema or rash.  Neurological:     Mental Status: She is alert and oriented to person, place, and time.     Cranial Nerves: No cranial nerve deficit.     Sensory: No sensory deficit.     Motor: No weakness.     Coordination: Coordination normal.     Gait: Gait normal.  Psychiatric:        Mood and Affect: Mood normal.        Behavior:  Behavior normal.        Thought Content: Thought content normal.        Judgment: Judgment normal.    Labs reviewed: Recent Labs    07/10/19 0916 09/25/19 0846  NA 139 138  K 4.0 4.2  CL 103 103  CO2 28 28  GLUCOSE 83 77  BUN 26* 32*  CREATININE 0.70 0.78  CALCIUM 9.3 9.4   Recent Labs    07/10/19 0916 09/25/19 0846  AST 18 18  ALT 13 14  BILITOT 0.4 0.4  PROT 6.3 6.5   Recent Labs    07/10/19 0916  WBC 6.8  NEUTROABS 3,781  HGB 12.4  HCT 37.3  MCV 89.9  PLT 229   Lab Results  Component Value Date   TSH 3.87 06/23/2016   Lab Results  Component Value Date   HGBA1C 5.3 05/02/2013   Lab Results  Component Value Date   CHOL 188 07/10/2019   HDL 75 07/10/2019   LDLCALC 94 07/10/2019   TRIG 96 07/10/2019   CHOLHDL 2.5 07/10/2019    Significant Diagnostic Results in last 30 days:  No results found.  Assessment/Plan  Bilateral leg edema No abrupt weight gain.bilateral trace edema noted.No cough,wheezing or shortness breath.  - Advised to check weight three times per week on Monday,wednesday and Friday then notify provider for any abrupt weight gain of 3 lbs in a day, cough,shortness of breath or wheezing  - continue to trial off lasix and Potassium for now.Restart lasix if edema,cough,wheezing or shortness of breath.check BMP and BNP if symptoms worsen. - Keep legs elevated when seated. - Continue walking exercises - Avoid adding extra salt in your food   Family/ staff Communication: Reviewed plan of care  with patient verbalized understanding.   Labs/tests ordered: None   Next Appointment: As needed if symptoms worsen or fail to improve.  Sandrea Hughs, NP

## 2019-11-21 NOTE — Patient Instructions (Addendum)
-   check your weight three times per week on Monday,wednesday and Friday.Notify provider for any abrupt weight gain of 3 lbs in a day, cough,shortness of breath or wheezing  - continue to trial off lasix and Potassium for now. - Keep legs elevated when seated. - Continue walking exercises - Avoid adding extra salt in your food  Edema  Edema is when you have too much fluid in your body or under your skin. Edema may make your legs, feet, and ankles swell up. Swelling is also common in looser tissues, like around your eyes. This is a common condition. It gets more common as you get older. There are many possible causes of edema. Eating too much salt (sodium) and being on your feet or sitting for a long time can cause edema in your legs, feet, and ankles. Hot weather may make edema worse. Edema is usually painless. Your skin may look swollen or shiny. Follow these instructions at home:  Keep the swollen body part raised (elevated) above the level of your heart when you are sitting or lying down.  Do not sit still or stand for a long time.  Do not wear tight clothes. Do not wear garters on your upper legs.  Exercise your legs. This can help the swelling go down.  Wear elastic bandages or support stockings as told by your doctor.  Eat a low-salt (low-sodium) diet to reduce fluid as told by your doctor.  Depending on the cause of your swelling, you may need to limit how much fluid you drink (fluid restriction).  Take over-the-counter and prescription medicines only as told by your doctor. Contact a doctor if:  Treatment is not working.  You have heart, liver, or kidney disease and have symptoms of edema.  You have sudden and unexplained weight gain. Get help right away if:  You have shortness of breath or chest pain.  You cannot breathe when you lie down.  You have pain, redness, or warmth in the swollen areas.  You have heart, liver, or kidney disease and get edema all of a  sudden.  You have a fever and your symptoms get worse all of a sudden. Summary  Edema is when you have too much fluid in your body or under your skin.  Edema may make your legs, feet, and ankles swell up. Swelling is also common in looser tissues, like around your eyes.  Raise (elevate) the swollen body part above the level of your heart when you are sitting or lying down.  Follow your doctor's instructions about diet and how much fluid you can drink (fluid restriction). This information is not intended to replace advice given to you by your health care provider. Make sure you discuss any questions you have with your health care provider. Document Revised: 05/04/2017 Document Reviewed: 05/19/2016 Elsevier Patient Education  2020 [employer].

## 2019-11-24 DIAGNOSIS — M6281 Muscle weakness (generalized): Secondary | ICD-10-CM | POA: Diagnosis not present

## 2019-11-24 DIAGNOSIS — M545 Low back pain: Secondary | ICD-10-CM | POA: Diagnosis not present

## 2019-11-26 DIAGNOSIS — M6281 Muscle weakness (generalized): Secondary | ICD-10-CM | POA: Diagnosis not present

## 2019-11-26 DIAGNOSIS — M545 Low back pain: Secondary | ICD-10-CM | POA: Diagnosis not present

## 2019-12-01 DIAGNOSIS — M6281 Muscle weakness (generalized): Secondary | ICD-10-CM | POA: Diagnosis not present

## 2019-12-01 DIAGNOSIS — M545 Low back pain: Secondary | ICD-10-CM | POA: Diagnosis not present

## 2019-12-02 DIAGNOSIS — S0510XA Contusion of eyeball and orbital tissues, unspecified eye, initial encounter: Secondary | ICD-10-CM | POA: Diagnosis not present

## 2019-12-02 DIAGNOSIS — H35363 Drusen (degenerative) of macula, bilateral: Secondary | ICD-10-CM | POA: Diagnosis not present

## 2019-12-02 DIAGNOSIS — H43391 Other vitreous opacities, right eye: Secondary | ICD-10-CM | POA: Diagnosis not present

## 2019-12-02 DIAGNOSIS — H4311 Vitreous hemorrhage, right eye: Secondary | ICD-10-CM | POA: Diagnosis not present

## 2019-12-02 DIAGNOSIS — H353132 Nonexudative age-related macular degeneration, bilateral, intermediate dry stage: Secondary | ICD-10-CM | POA: Diagnosis not present

## 2019-12-02 DIAGNOSIS — H35722 Serous detachment of retinal pigment epithelium, left eye: Secondary | ICD-10-CM | POA: Diagnosis not present

## 2019-12-02 DIAGNOSIS — H5315 Visual distortions of shape and size: Secondary | ICD-10-CM | POA: Diagnosis not present

## 2019-12-04 DIAGNOSIS — M6281 Muscle weakness (generalized): Secondary | ICD-10-CM | POA: Diagnosis not present

## 2019-12-04 DIAGNOSIS — M545 Low back pain: Secondary | ICD-10-CM | POA: Diagnosis not present

## 2019-12-08 DIAGNOSIS — M545 Low back pain: Secondary | ICD-10-CM | POA: Diagnosis not present

## 2019-12-08 DIAGNOSIS — M6281 Muscle weakness (generalized): Secondary | ICD-10-CM | POA: Diagnosis not present

## 2019-12-11 DIAGNOSIS — M545 Low back pain: Secondary | ICD-10-CM | POA: Diagnosis not present

## 2019-12-11 DIAGNOSIS — M6281 Muscle weakness (generalized): Secondary | ICD-10-CM | POA: Diagnosis not present

## 2019-12-31 DIAGNOSIS — M19072 Primary osteoarthritis, left ankle and foot: Secondary | ICD-10-CM | POA: Diagnosis not present

## 2020-01-15 DIAGNOSIS — L820 Inflamed seborrheic keratosis: Secondary | ICD-10-CM | POA: Diagnosis not present

## 2020-01-15 DIAGNOSIS — L821 Other seborrheic keratosis: Secondary | ICD-10-CM | POA: Diagnosis not present

## 2020-01-15 DIAGNOSIS — D1801 Hemangioma of skin and subcutaneous tissue: Secondary | ICD-10-CM | POA: Diagnosis not present

## 2020-01-15 DIAGNOSIS — L814 Other melanin hyperpigmentation: Secondary | ICD-10-CM | POA: Diagnosis not present

## 2020-02-03 DIAGNOSIS — Z23 Encounter for immunization: Secondary | ICD-10-CM | POA: Diagnosis not present

## 2020-02-06 ENCOUNTER — Other Ambulatory Visit: Payer: Self-pay | Admitting: Internal Medicine

## 2020-02-06 DIAGNOSIS — I10 Essential (primary) hypertension: Secondary | ICD-10-CM

## 2020-03-03 DIAGNOSIS — H35453 Secondary pigmentary degeneration, bilateral: Secondary | ICD-10-CM | POA: Diagnosis not present

## 2020-03-03 DIAGNOSIS — H353112 Nonexudative age-related macular degeneration, right eye, intermediate dry stage: Secondary | ICD-10-CM | POA: Diagnosis not present

## 2020-03-03 DIAGNOSIS — H35722 Serous detachment of retinal pigment epithelium, left eye: Secondary | ICD-10-CM | POA: Diagnosis not present

## 2020-03-03 DIAGNOSIS — H353122 Nonexudative age-related macular degeneration, left eye, intermediate dry stage: Secondary | ICD-10-CM | POA: Diagnosis not present

## 2020-03-03 DIAGNOSIS — Z961 Presence of intraocular lens: Secondary | ICD-10-CM | POA: Diagnosis not present

## 2020-03-03 DIAGNOSIS — H5315 Visual distortions of shape and size: Secondary | ICD-10-CM | POA: Diagnosis not present

## 2020-03-03 DIAGNOSIS — H35363 Drusen (degenerative) of macula, bilateral: Secondary | ICD-10-CM | POA: Diagnosis not present

## 2020-03-03 DIAGNOSIS — H35721 Serous detachment of retinal pigment epithelium, right eye: Secondary | ICD-10-CM | POA: Diagnosis not present

## 2020-03-15 ENCOUNTER — Other Ambulatory Visit: Payer: Self-pay

## 2020-03-15 ENCOUNTER — Ambulatory Visit (INDEPENDENT_AMBULATORY_CARE_PROVIDER_SITE_OTHER): Payer: Medicare Other | Admitting: Internal Medicine

## 2020-03-15 ENCOUNTER — Encounter: Payer: Self-pay | Admitting: Internal Medicine

## 2020-03-15 VITALS — BP 120/72 | HR 68 | Temp 97.5°F | Ht 62.0 in | Wt 132.1 lb

## 2020-03-15 DIAGNOSIS — Z9181 History of falling: Secondary | ICD-10-CM

## 2020-03-15 DIAGNOSIS — Z9889 Other specified postprocedural states: Secondary | ICD-10-CM

## 2020-03-15 DIAGNOSIS — Z8719 Personal history of other diseases of the digestive system: Secondary | ICD-10-CM | POA: Diagnosis not present

## 2020-03-15 DIAGNOSIS — R2689 Other abnormalities of gait and mobility: Secondary | ICD-10-CM

## 2020-03-15 DIAGNOSIS — R635 Abnormal weight gain: Secondary | ICD-10-CM

## 2020-03-15 DIAGNOSIS — I10 Essential (primary) hypertension: Secondary | ICD-10-CM | POA: Diagnosis not present

## 2020-03-15 DIAGNOSIS — I872 Venous insufficiency (chronic) (peripheral): Secondary | ICD-10-CM | POA: Diagnosis not present

## 2020-03-15 NOTE — Progress Notes (Signed)
Location:  [facility] clinic Provider:  Mavric Cortright L. Mariea Clonts, D.O., C.M.D.  Code Status: DNR Goals of Care:  Advanced Directives 03/15/2020  Does Patient Have a Medical Advance Directive? Yes  Type of Advance Directive Out of facility DNR (pink MOST or yellow form)  Does patient want to make changes to medical advance directive? No - Patient declined  Copy of [place] in Chart? -  Pre-existing out of facility DNR order (yellow form or pink MOST form) -     Chief Complaint  Patient presents with  . Medical Management of Chronic Issues    4 month follow up    HPI: Patient is a 86 y.o. female seen today for medical management of chronic diseases.    She's been gaining weight.  She'd been a 10 and now cannot get in her pants.  She's had her right hernia, wears a girdle.  Appetite is good.  She only has a couple desserts all week.  She is up 5 lbs since July.  She's been tracking her weights am and pm.  Since she went to the beach, she's not weighed.  Eats two well-balanced meals--very late breakfast and dinner.  She sleeps in.  Also goes to bed late.  BMs are soft brown and uses metamucil off and on.    [place] the [place] and then [place].  Ate well both times.    [place] nurse takes bp readings and she has been doing that since 9/7.  120-148/60-80.   She had a fall getting out of the shower.  Tried to pull her towel off the rack. She did not get any bruises.  Says she bounced around.   Started to use a walker instead of cane after that.  She would like to do more therapy to strengthen her legs.  She has worked with Evaristo Bury is therapist's name.    If she does not go off to sleep, takes a regular tylenol and falls asleep.  Past Medical History:  Diagnosis Date  . Cervicalgia   . Chest pain at rest 01/07/2012  . Displacement of cervical intervertebral disc without myelopathy   . Diverticulosis of colon (without mention of hemorrhage)   . Female stress incontinence    . GERD (gastroesophageal reflux disease)    no peds occasional pepcid  . Inguinal hernia without mention of obstruction or gangrene, unilateral or unspecified, (not specified as recurrent)   . Internal hemorrhoids without mention of complication   . Lumbago   . Macular degeneration (senile) of retina, unspecified   . Osteoarthrosis, unspecified whether generalized or localized, unspecified site   . Other and unspecified hyperlipidemia   . Pain in joint, hand   . Palpitations   . Reflux esophagitis   . Scoliosis (and kyphoscoliosis), idiopathic   . Senile osteoporosis   . Skin disorder   . Thyroid disease   . TIA (transient ischemic attack)   . Unruptured popliteal cyst 06/24/2014   Right knee   . Unspecified essential hypertension   . Unspecified glaucoma(365.9)   . Unspecified hypothyroidism   . Unspecified vitamin D deficiency     Past Surgical History:  Procedure Laterality Date  . ABDOMINAL HYSTERECTOMY  1975   Dr Mallie Mussel  . APPENDECTOMY  1988  . cardiolite myocardial perfusion study    . DOPPLER ECHOCARDIOGRAPHY    . EYE SURGERY Bilateral 2009   cataract removed right eye, Dr Charise Killian  . FOOT SURGERY  august 2013  Doran Durand, MD  . INGUINAL HERNIA REPAIR Bilateral    w/mesh  . INGUINAL HERNIA REPAIR Left 08/03/2017   Procedure: LAPAROSCOPIC LEFT INGUINAL HERNIA REPAIR WITH MESH;  Surgeon: Greer Pickerel, MD;  Location: [place];  Service: General;  Laterality: Left;  . INGUINAL HERNIA REPAIR Right 08/03/2017   Procedure: HERNIA REPAIR RIGHT INGUINAL ADULT WITH MESH;  Surgeon: Greer Pickerel, MD;  Location: [place];  Service: General;  Laterality: Right;  . INGUINAL HERNIA REPAIR     Dr. Redmond Pulling 04-24-18  . INGUINAL HERNIA REPAIR Right 04/24/2018   Procedure: DIAGNOSTIC LAPAROSCOPIC, OPENREPAIR OF RECURRENT RIGHT INGUINAL HERNIA WITH MESH ERAS PATHWAY;  Surgeon: Greer Pickerel, MD;  Location: [facility];  Service: General;  Laterality: Right;  . INSERTION OF MESH Bilateral 08/03/2017    Procedure: INSERTION OF MESH;  Surgeon: Greer Pickerel, MD;  Location: [place];  Service: General;  Laterality: Bilateral;  . NM MYOVIEW LTD     negative  . TONSILLECTOMY  1937  . TOOTH EXTRACTION  09/16/13   Dr Carlos American    Allergies  Allergen Reactions  . Trimethoprim Other (See Comments)    Headaches/ ear pressure   . Erythromycin Other (See Comments)    UNSPECIFIED REACTION   . Macrodantin [Nitrofurantoin Macrocrystal] Other (See Comments)    UNSPECIFIED REACTION   . Penicillins Other (See Comments)    UNSPECIFIED REACTION  Has patient had a PCN reaction causing immediate rash, facial/tongue/throat swelling, SOB or lightheadedness with hypotension: Unknown Has patient had a PCN reaction causing severe rash involving mucus membranes or skin necrosis: Unknown Has patient had a PCN reaction that required hospitalization: No Has patient had a PCN reaction occurring within the last 10 years: No If all of the above answers are "NO", then may proceed with Cephalosporin use.  . Sulfa Antibiotics Other (See Comments)    UNSPECIFIED REACTION   . Latex Itching    Outpatient Encounter Medications as of 03/15/2020  Medication Sig  . acetaminophen (TYLENOL) 500 MG tablet Take 500 mg by mouth 2 (two) times daily as needed for mild pain or headache.   . bimatoprost (LUMIGAN) 0.01 % SOLN Place 1 drop into both eyes at bedtime.  . Calcium Carbonate-Vitamin D 600-400 MG-UNIT chew tablet Chew 1 tablet by mouth 2 (two) times daily.   . camphor-menthol (SARNA) lotion Apply 1 application topically as needed for itching. Prescribed by Dermatology at [facility]  . Cholecalciferol (VITAMIN D3) 50 MCG (2000 UT) TABS Take by mouth.  . clopidogrel (PLAVIX) 75 MG tablet TAKE 1 TABLET ONCE A DAY TO PREVENT STROKE.  Marland Kitchen CRANBERRY EXTRACT PO Take 1 tablet by mouth daily.  . famotidine (PEPCID AC) 10 MG chewable tablet Chew 10 mg by mouth at bedtime as needed for heartburn.  . hydrocortisone cream 1 % Apply 1  application topically daily as needed for itching.   . metoprolol succinate (TOPROL-XL) 25 MG 24 hr tablet TAKE 1 TABLET ONCE DAILY TO REGULATE HEART AND CONTROL BLOOD PRESSURE.  . Multiple Vitamins-Minerals (PRESERVISION AREDS 2 PO) Take 1 capsule by mouth 2 (two) times daily.   Vladimir Faster Glycol-Propyl Glycol (SYSTANE ULTRA) 0.4-0.3 % SOLN Place 1 vial into both eyes 2 (two) times daily. May use another dose during the day as needed for dry eye   . psyllium (METAMUCIL) 58.6 % packet Take 1 packet by mouth daily. Mix with water  . vitamin C (ASCORBIC ACID) 500 MG tablet Take 500 mg by mouth daily with supper.   . [DISCONTINUED]  furosemide (LASIX) 20 MG tablet Take 20 mg by mouth 3 (three) times a week.  . [DISCONTINUED] potassium chloride SA (KLOR-CON) 20 MEQ tablet Take 20 mEq by mouth 3 (three) times a week. With Lasix   No facility-administered encounter medications on file as of 03/15/2020.    Review of Systems:  Review of Systems  Constitutional: Negative for chills, fever and malaise/fatigue.  HENT: Negative for congestion and sore throat.   Eyes: Negative for blurred vision.       Glasses  Respiratory: Negative for cough and shortness of breath.   Cardiovascular: Positive for leg swelling. Negative for chest pain and palpitations.       Mild edema but wearing compression socks with benefit  Gastrointestinal: Positive for constipation. Negative for abdominal pain.  Genitourinary: Negative for dysuria.  Musculoskeletal: Positive for falls. Negative for back pain and joint pain.  Skin: Negative for itching and rash.  Neurological: Negative for dizziness and loss of consciousness.  Endo/Heme/Allergies: Bruises/bleeds easily.  Psychiatric/Behavioral: Negative for depression and memory loss. The patient is not nervous/anxious and does not have insomnia.     Health Maintenance  Topic Date Due  . TETANUS/TDAP  03/20/2020  . MAMMOGRAM  05/13/2020  . INFLUENZA VACCINE  Completed  .  DEXA SCAN  Completed  . COVID-19 Vaccine  Completed  . PNA vac Low Risk Adult  Completed    Physical Exam: Vitals:   03/15/20 1109  BP: 120/72  Pulse: 68  Temp: (!) 97.5 F (36.4 C)  TempSrc: Temporal  SpO2: 97%  Weight: 132 lb 1.6 oz (59.9 kg)  Height: 5\' 2"  (1.575 m)   Body mass index is 24.16 kg/m. Physical Exam Vitals reviewed.  Constitutional:      General: She is not in acute distress.    Appearance: Normal appearance. She is not toxic-appearing.  HENT:     Head: Normocephalic and atraumatic.  Eyes:     Comments: glasses  Cardiovascular:     Rate and Rhythm: Normal rate and regular rhythm.     Pulses: Normal pulses.     Heart sounds: Normal heart sounds.  Pulmonary:     Effort: Pulmonary effort is normal.     Breath sounds: Normal breath sounds. No wheezing, rhonchi or rales.  Abdominal:     General: Bowel sounds are normal.  Musculoskeletal:        General: Normal range of motion.     Cervical back: Neck supple.     Right lower leg: Edema present.     Left lower leg: Edema present.     Comments: Walking with rolling walker  Lymphadenopathy:     Cervical: No cervical adenopathy.  Neurological:     General: No focal deficit present.     Mental Status: She is alert and oriented to person, place, and time. Mental status is at baseline.     Gait: Gait abnormal.  Psychiatric:        Mood and Affect: Mood normal.        Behavior: Behavior normal.        Thought Content: Thought content normal.        Judgment: Judgment normal.     Labs reviewed: Basic Metabolic Panel: Recent Labs    07/10/19 0916 09/25/19 0846  NA 139 138  K 4.0 4.2  CL 103 103  CO2 28 28  GLUCOSE 83 77  BUN 26* 32*  CREATININE 0.70 0.78  CALCIUM 9.3 9.4   Liver Function Tests: Recent Labs  07/10/19 0916 09/25/19 0846  AST 18 18  ALT 13 14  BILITOT 0.4 0.4  PROT 6.3 6.5   No results for input(s): LIPASE, AMYLASE in the last 8760 hours. No results for input(s):  AMMONIA in the last 8760 hours. CBC: Recent Labs    07/10/19 0916  WBC 6.8  NEUTROABS 3,781  HGB 12.4  HCT 37.3  MCV 89.9  PLT 229   Lipid Panel: Recent Labs    07/10/19 0916  CHOL 188  HDL 75  LDLCALC 94  TRIG 96  CHOLHDL 2.5   Lab Results  Component Value Date   HGBA1C 5.3 05/02/2013    Procedures since last visit: No results found.  Assessment/Plan 1. Chronic venous insufficiency - encouraged her to elevate her feet at rest -cont compression hose but be careful with zippers - CBC with Differential/Platelet; Future - COMPLETE METABOLIC PANEL WITH GFR; Future  2. Essential hypertension -bp well controlled based on readings she brought and today's -no changes needed - CBC with Differential/Platelet; Future - COMPLETE METABOLIC PANEL WITH GFR; Future  3. S/P bilateral inguinal hernia repair -has some chronic concerns about these especially right -discussed that prominence of abdomen/weight there is not worrisome for me about her and some postural  4. Weight gain -ok for her age and risk of falls and other acute illness--good to have reserves  5. H/O fall -in bathroom while grabbing towel after shower, fortunately, no major injury  6. Balance problem -gave Rx for [facility], Jinny Blossom, at [facility] per her request -she's had a fall since last time and balance and strength not 100%, needs some guidance with best assistive device at this point  Labs/tests ordered:   Lab Orders     CBC with Differential/Platelet     COMPLETE METABOLIC PANEL WITH GFR  Next appt:  06/14/2020  Josephus Harriger L. Kobie Whidby, D.O. [facility] [address] [place], [place] [address] Cell Phone (Mon-Fri 8am-5pm):  [phone] On Call:  [phone] & follow prompts after 5pm & weekends Office Phone:  [phone] Office Fax:  [phone]

## 2020-03-17 DIAGNOSIS — M6281 Muscle weakness (generalized): Secondary | ICD-10-CM | POA: Diagnosis not present

## 2020-03-17 DIAGNOSIS — R2681 Unsteadiness on feet: Secondary | ICD-10-CM | POA: Diagnosis not present

## 2020-03-17 DIAGNOSIS — R2689 Other abnormalities of gait and mobility: Secondary | ICD-10-CM | POA: Diagnosis not present

## 2020-03-18 DIAGNOSIS — L820 Inflamed seborrheic keratosis: Secondary | ICD-10-CM | POA: Diagnosis not present

## 2020-03-18 DIAGNOSIS — L570 Actinic keratosis: Secondary | ICD-10-CM | POA: Diagnosis not present

## 2020-03-18 DIAGNOSIS — L814 Other melanin hyperpigmentation: Secondary | ICD-10-CM | POA: Diagnosis not present

## 2020-03-22 DIAGNOSIS — H353133 Nonexudative age-related macular degeneration, bilateral, advanced atrophic without subfoveal involvement: Secondary | ICD-10-CM | POA: Diagnosis not present

## 2020-03-22 DIAGNOSIS — Z961 Presence of intraocular lens: Secondary | ICD-10-CM | POA: Diagnosis not present

## 2020-03-22 DIAGNOSIS — H401132 Primary open-angle glaucoma, bilateral, moderate stage: Secondary | ICD-10-CM | POA: Diagnosis not present

## 2020-03-22 DIAGNOSIS — H04123 Dry eye syndrome of bilateral lacrimal glands: Secondary | ICD-10-CM | POA: Diagnosis not present

## 2020-03-23 DIAGNOSIS — R2681 Unsteadiness on feet: Secondary | ICD-10-CM | POA: Diagnosis not present

## 2020-03-23 DIAGNOSIS — M6281 Muscle weakness (generalized): Secondary | ICD-10-CM | POA: Diagnosis not present

## 2020-03-23 DIAGNOSIS — R2689 Other abnormalities of gait and mobility: Secondary | ICD-10-CM | POA: Diagnosis not present

## 2020-03-25 DIAGNOSIS — M6281 Muscle weakness (generalized): Secondary | ICD-10-CM | POA: Diagnosis not present

## 2020-03-25 DIAGNOSIS — R2681 Unsteadiness on feet: Secondary | ICD-10-CM | POA: Diagnosis not present

## 2020-03-25 DIAGNOSIS — R2689 Other abnormalities of gait and mobility: Secondary | ICD-10-CM | POA: Diagnosis not present

## 2020-03-29 DIAGNOSIS — Z23 Encounter for immunization: Secondary | ICD-10-CM | POA: Diagnosis not present

## 2020-03-30 DIAGNOSIS — M6281 Muscle weakness (generalized): Secondary | ICD-10-CM | POA: Diagnosis not present

## 2020-03-30 DIAGNOSIS — R2689 Other abnormalities of gait and mobility: Secondary | ICD-10-CM | POA: Diagnosis not present

## 2020-03-30 DIAGNOSIS — R2681 Unsteadiness on feet: Secondary | ICD-10-CM | POA: Diagnosis not present

## 2020-04-02 ENCOUNTER — Other Ambulatory Visit: Payer: Self-pay | Admitting: Internal Medicine

## 2020-04-02 DIAGNOSIS — G459 Transient cerebral ischemic attack, unspecified: Secondary | ICD-10-CM

## 2020-04-02 DIAGNOSIS — M6281 Muscle weakness (generalized): Secondary | ICD-10-CM | POA: Diagnosis not present

## 2020-04-02 DIAGNOSIS — R2681 Unsteadiness on feet: Secondary | ICD-10-CM | POA: Diagnosis not present

## 2020-04-02 DIAGNOSIS — R2689 Other abnormalities of gait and mobility: Secondary | ICD-10-CM | POA: Diagnosis not present

## 2020-04-06 DIAGNOSIS — R2681 Unsteadiness on feet: Secondary | ICD-10-CM | POA: Diagnosis not present

## 2020-04-06 DIAGNOSIS — R2689 Other abnormalities of gait and mobility: Secondary | ICD-10-CM | POA: Diagnosis not present

## 2020-04-06 DIAGNOSIS — M6281 Muscle weakness (generalized): Secondary | ICD-10-CM | POA: Diagnosis not present

## 2020-04-09 DIAGNOSIS — R2681 Unsteadiness on feet: Secondary | ICD-10-CM | POA: Diagnosis not present

## 2020-04-09 DIAGNOSIS — M6281 Muscle weakness (generalized): Secondary | ICD-10-CM | POA: Diagnosis not present

## 2020-04-09 DIAGNOSIS — R2689 Other abnormalities of gait and mobility: Secondary | ICD-10-CM | POA: Diagnosis not present

## 2020-04-13 DIAGNOSIS — R2681 Unsteadiness on feet: Secondary | ICD-10-CM | POA: Diagnosis not present

## 2020-04-13 DIAGNOSIS — M6281 Muscle weakness (generalized): Secondary | ICD-10-CM | POA: Diagnosis not present

## 2020-04-13 DIAGNOSIS — R2689 Other abnormalities of gait and mobility: Secondary | ICD-10-CM | POA: Diagnosis not present

## 2020-04-15 DIAGNOSIS — M6281 Muscle weakness (generalized): Secondary | ICD-10-CM | POA: Diagnosis not present

## 2020-04-15 DIAGNOSIS — R2681 Unsteadiness on feet: Secondary | ICD-10-CM | POA: Diagnosis not present

## 2020-04-15 DIAGNOSIS — R2689 Other abnormalities of gait and mobility: Secondary | ICD-10-CM | POA: Diagnosis not present

## 2020-04-20 ENCOUNTER — Other Ambulatory Visit: Payer: Self-pay

## 2020-04-20 ENCOUNTER — Ambulatory Visit (INDEPENDENT_AMBULATORY_CARE_PROVIDER_SITE_OTHER): Payer: Medicare Other | Admitting: Cardiovascular Disease

## 2020-04-20 ENCOUNTER — Encounter: Payer: Self-pay | Admitting: Cardiovascular Disease

## 2020-04-20 VITALS — BP 160/88 | HR 64 | Ht 60.0 in | Wt 131.0 lb

## 2020-04-20 DIAGNOSIS — R2689 Other abnormalities of gait and mobility: Secondary | ICD-10-CM | POA: Diagnosis not present

## 2020-04-20 DIAGNOSIS — M6281 Muscle weakness (generalized): Secondary | ICD-10-CM | POA: Diagnosis not present

## 2020-04-20 DIAGNOSIS — E782 Mixed hyperlipidemia: Secondary | ICD-10-CM | POA: Diagnosis not present

## 2020-04-20 DIAGNOSIS — R2681 Unsteadiness on feet: Secondary | ICD-10-CM | POA: Diagnosis not present

## 2020-04-20 DIAGNOSIS — I10 Essential (primary) hypertension: Secondary | ICD-10-CM

## 2020-04-20 NOTE — Progress Notes (Signed)
04/20/2020 [name]   04/16/1929  [id number]  Primary Physician Gayland Curry, DO Primary Cardiologist: Lorretta Harp MD FACP, [place], [place], [place]  HPI:  [name] is a 86 y.o.  thin appearing widowed Caucasian female mother of 2, grandmother of 2 grandchildren who I last sawin the office  04/16/2019. Her only symptoms at that time were palpitations on a low-dose beta blocker. She also had a history of GERD. She was admitted to [facility] on January 06, 2013 with chest pain. She ruled out for myocardial infarction. She had a Myoview stress test which was normal. She had no recurrent symptoms.she also had TIA type symptoms last year and was diagnosed with a small "cerebral aneurysm "conservative medical therapy was recommended. She follows up with a neurologist and has been asymptomatic. She does have left shoulder issues and apparently since seen Dr. Onnie Graham was recommended total shoulder replacement.Since I saw her a year ago she is remaining clinically stable. She denies chest pain or shortness of breath. [place] [facility] [place] her granddaughter's wedding and wishes to return.  Since I saw her a year ago she is remained stable. She denies chest pain or shortness of breath.  She underwent uncomplicated inguinal hernia repair by Dr. Redmond Pulling back in December 2019.  She did tell me that she recently fell in her apartment and injured her knee although this is slowly healing.  She walks with a walker.   Current Meds  Medication Sig  . acetaminophen (TYLENOL) 500 MG tablet Take 500 mg by mouth 2 (two) times daily as needed for mild pain or headache.   . bimatoprost (LUMIGAN) 0.01 % SOLN Place 1 drop into both eyes at bedtime.  . Calcium Carbonate-Vitamin D 600-400 MG-UNIT chew tablet Chew 1 tablet by mouth 2 (two) times daily.   . camphor-menthol (SARNA) lotion Apply 1 application topically as needed for itching. Prescribed by Dermatology at [facility]   . Cholecalciferol (VITAMIN D3) 50 MCG (2000 UT) TABS Take by mouth.  . clopidogrel (PLAVIX) 75 MG tablet TAKE 1 TABLET ONCE A DAY TO PREVENT STROKE.  Marland Kitchen CRANBERRY EXTRACT PO Take 1 tablet by mouth daily.  . famotidine (PEPCID AC) 10 MG chewable tablet Chew 10 mg by mouth at bedtime as needed for heartburn.  . hydrocortisone cream 1 % Apply 1 application topically daily as needed for itching.   . metoprolol succinate (TOPROL-XL) 25 MG 24 hr tablet TAKE 1 TABLET ONCE DAILY TO REGULATE HEART AND CONTROL BLOOD PRESSURE.  . Multiple Vitamins-Minerals (PRESERVISION AREDS 2 PO) Take 1 capsule by mouth 2 (two) times daily.   Vladimir Faster Glycol-Propyl Glycol (SYSTANE ULTRA) 0.4-0.3 % SOLN Place 1 vial into both eyes 2 (two) times daily. May use another dose during the day as needed for dry eye   . psyllium (METAMUCIL) 58.6 % packet Take 1 packet by mouth daily. Mix with water  . vitamin C (ASCORBIC ACID) 500 MG tablet Take 500 mg by mouth daily with supper.      Allergies  Allergen Reactions  . Trimethoprim Other (See Comments)    Headaches/ ear pressure   . Erythromycin Other (See Comments)    UNSPECIFIED REACTION   . Macrodantin [Nitrofurantoin Macrocrystal] Other (See Comments)    UNSPECIFIED REACTION   . Penicillins Other (See Comments)    UNSPECIFIED REACTION  Has patient had a PCN reaction causing immediate rash, facial/tongue/throat swelling, SOB or lightheadedness with hypotension: Unknown Has patient had  a PCN reaction causing severe rash involving mucus membranes or skin necrosis: Unknown Has patient had a PCN reaction that required hospitalization: No Has patient had a PCN reaction occurring within the last 10 years: No If all of the above answers are "NO", then may proceed with Cephalosporin use.  . Sulfa Antibiotics Other (See Comments)    UNSPECIFIED REACTION   . Latex Itching    Social History   Socioeconomic History  . Marital status: Widowed    Spouse name: Not on file   . Number of children: 2  . Years of education: college  . Highest education level: Not on file  Occupational History    Comment: retired  Tobacco Use  . Smoking status: Never Smoker  . Smokeless tobacco: Never Used  Vaping Use  . Vaping Use: Never used  Substance and Sexual Activity  . Alcohol use: No  . Drug use: No  . Sexual activity: Not Currently  Other Topics Concern  . Not on file  Social History Narrative   Patient lives at home with her daughter [name]) , moved to [facility] 02/02/16 [place]    Widowed.   Retired.   EducationNurse, mental health.   Right handed.   Caffeine- None some times tea very rare.   Never smoked   Alcohol none   Social Determinants of Health   Financial Resource Strain:   . Difficulty of Paying Living Expenses: Not on file  Food Insecurity:   . Worried About Charity fundraiser in the Last Year: Not on file  . Ran Out of Food in the Last Year: Not on file  Transportation Needs:   . Lack of Transportation (Medical): Not on file  . Lack of Transportation (Non-Medical): Not on file  Physical Activity:   . Days of Exercise per Week: Not on file  . Minutes of Exercise per Session: Not on file  Stress:   . Feeling of Stress : Not on file  Social Connections:   . Frequency of Communication with Friends and Family: Not on file  . Frequency of Social Gatherings with Friends and Family: Not on file  . Attends Religious Services: Not on file  . Active Member of Clubs or Organizations: Not on file  . Attends Archivist Meetings: Not on file  . Marital Status: Not on file  Intimate Partner Violence:   . Fear of Current or Ex-Partner: Not on file  . Emotionally Abused: Not on file  . Physically Abused: Not on file  . Sexually Abused: Not on file     Review of Systems: General: negative for chills, fever, night sweats or weight changes.  Cardiovascular: negative for chest pain, dyspnea on exertion, edema, orthopnea, palpitations,  paroxysmal nocturnal dyspnea or shortness of breath Dermatological: negative for rash Respiratory: negative for cough or wheezing Urologic: negative for hematuria Abdominal: negative for nausea, vomiting, diarrhea, bright red blood per rectum, melena, or hematemesis Neurologic: negative for visual changes, syncope, or dizziness All other systems reviewed and are otherwise negative except as noted above.    Blood pressure (!) 160/88, pulse 64, height 5' (1.524 m), weight 131 lb (59.4 kg).  General appearance: alert and no distress Neck: no adenopathy, no carotid bruit, no JVD, supple, symmetrical, trachea midline and thyroid not enlarged, symmetric, no tenderness/mass/nodules Lungs: clear to auscultation bilaterally Heart: regular rate and rhythm, S1, S2 normal, no murmur, click, rub or gallop Extremities: extremities normal, atraumatic, no cyanosis or edema Pulses: 2+ and symmetric Skin:  Skin color, texture, turgor normal. No rashes or lesions Neurologic: Alert and oriented X 3, normal strength and tone. Normal symmetric reflexes. Normal coordination and gait  EKG sinus rhythm at 64 with poor R wave progression.  I personally reviewed this EKG.  ASSESSMENT AND PLAN:   Essential hypertension History of essential hypertension a blood pressure measured today at 160/88.  She is on metoprolol.  I reviewed her blood pressure log at home which has shown blood pressures all in the normal range.  I suspect today's blood pressure is "whitecoat hypertension".  Hyperlipidemia History of hyperlipidemia not on statin therapy with lipid profile performed 07/10/2019 revealing total cholesterol 188, LDL of 94 and HDL of [place] MD [facility], [facility] 04/20/2020 1:48 PM

## 2020-04-20 NOTE — Assessment & Plan Note (Signed)
History of essential hypertension a blood pressure measured today at 160/88.  She is on metoprolol.  I reviewed her blood pressure log at home which has shown blood pressures all in the normal range.  I suspect today's blood pressure is "whitecoat hypertension".

## 2020-04-20 NOTE — Patient Instructions (Addendum)
Medication Instructions:  Your physician recommends that you continue on your current medications as directed. Please refer to the Current Medication list given to you today.  *If you need a refill on your cardiac medications before your next appointment, please call your pharmacy*  Follow-Up: At [facility], you and your health needs are our priority.  As part of our continuing mission to provide you with exceptional heart care, we have created designated Provider Care Teams.  These Care Teams include your primary Cardiologist (physician) and Advanced Practice Providers (APPs -  Physician Assistants and Nurse Practitioners) who all work together to provide you with the care you need, when you need it.  We recommend signing up for the patient portal called "MyChart".  Sign up information is provided on this After Visit Summary.  MyChart is used to connect with patients for Virtual Visits (Telemedicine).  Patients are able to view lab/test results, encounter notes, upcoming appointments, etc.  Non-urgent messages can be sent to your provider as well.   To learn more about what you can do with MyChart, go to [email or web address].    Your next appointment:   12 month(s)  The format for your next appointment:   In Person  Provider:   Quay Burow, MD She will distended only if she were very active if she were walking with a walker and she had severe pain when she walked would consider evaluating

## 2020-04-20 NOTE — Assessment & Plan Note (Signed)
History of hyperlipidemia not on statin therapy with lipid profile performed 07/10/2019 revealing total cholesterol 188, LDL of 94 and HDL of 75

## 2020-04-23 DIAGNOSIS — M6281 Muscle weakness (generalized): Secondary | ICD-10-CM | POA: Diagnosis not present

## 2020-04-23 DIAGNOSIS — R2681 Unsteadiness on feet: Secondary | ICD-10-CM | POA: Diagnosis not present

## 2020-04-23 DIAGNOSIS — R2689 Other abnormalities of gait and mobility: Secondary | ICD-10-CM | POA: Diagnosis not present

## 2020-04-27 DIAGNOSIS — R2689 Other abnormalities of gait and mobility: Secondary | ICD-10-CM | POA: Diagnosis not present

## 2020-04-27 DIAGNOSIS — M6281 Muscle weakness (generalized): Secondary | ICD-10-CM | POA: Diagnosis not present

## 2020-04-27 DIAGNOSIS — R2681 Unsteadiness on feet: Secondary | ICD-10-CM | POA: Diagnosis not present

## 2020-04-29 DIAGNOSIS — R2689 Other abnormalities of gait and mobility: Secondary | ICD-10-CM | POA: Diagnosis not present

## 2020-04-29 DIAGNOSIS — M6281 Muscle weakness (generalized): Secondary | ICD-10-CM | POA: Diagnosis not present

## 2020-04-29 DIAGNOSIS — R2681 Unsteadiness on feet: Secondary | ICD-10-CM | POA: Diagnosis not present

## 2020-05-16 ENCOUNTER — Other Ambulatory Visit: Payer: Self-pay | Admitting: Internal Medicine

## 2020-05-16 DIAGNOSIS — I10 Essential (primary) hypertension: Secondary | ICD-10-CM

## 2020-05-17 NOTE — Telephone Encounter (Signed)
rx sent to pharmacy by e-script  

## 2020-05-26 DIAGNOSIS — D1801 Hemangioma of skin and subcutaneous tissue: Secondary | ICD-10-CM | POA: Diagnosis not present

## 2020-05-26 DIAGNOSIS — L570 Actinic keratosis: Secondary | ICD-10-CM | POA: Diagnosis not present

## 2020-05-26 DIAGNOSIS — L814 Other melanin hyperpigmentation: Secondary | ICD-10-CM | POA: Diagnosis not present

## 2020-05-26 DIAGNOSIS — L820 Inflamed seborrheic keratosis: Secondary | ICD-10-CM | POA: Diagnosis not present

## 2020-05-26 DIAGNOSIS — D692 Other nonthrombocytopenic purpura: Secondary | ICD-10-CM | POA: Diagnosis not present

## 2020-06-09 ENCOUNTER — Other Ambulatory Visit: Payer: Self-pay

## 2020-06-09 ENCOUNTER — Other Ambulatory Visit: Payer: Medicare Other

## 2020-06-09 DIAGNOSIS — I872 Venous insufficiency (chronic) (peripheral): Secondary | ICD-10-CM

## 2020-06-09 DIAGNOSIS — I10 Essential (primary) hypertension: Secondary | ICD-10-CM

## 2020-06-09 LAB — CBC WITH DIFFERENTIAL/PLATELET
Absolute Monocytes: 607 cells/uL (ref 200–950)
Basophils Absolute: 73 cells/uL (ref 0–200)
Basophils Relative: 1.1 %
Eosinophils Absolute: 310 cells/uL (ref 15–500)
Eosinophils Relative: 4.7 %
HCT: 36.9 % (ref 35.0–45.0)
Hemoglobin: 12.3 g/dL (ref 11.7–15.5)
Lymphs Abs: 2528 cells/uL (ref 850–3900)
MCH: 30.4 pg (ref 27.0–33.0)
MCHC: 33.3 g/dL (ref 32.0–36.0)
MCV: 91.3 fL (ref 80.0–100.0)
MPV: 10.5 fL (ref 7.5–12.5)
Monocytes Relative: 9.2 %
Neutro Abs: 3082 cells/uL (ref 1500–7800)
Neutrophils Relative %: 46.7 %
Platelets: 207 10*3/uL (ref 140–400)
RBC: 4.04 10*6/uL (ref 3.80–5.10)
RDW: 14 % (ref 11.0–15.0)
Total Lymphocyte: 38.3 %
WBC: 6.6 10*3/uL (ref 3.8–10.8)

## 2020-06-09 LAB — COMPLETE METABOLIC PANEL WITH GFR
AG Ratio: 2 (calc) (ref 1.0–2.5)
ALT: 11 U/L (ref 6–29)
AST: 17 U/L (ref 10–35)
Albumin: 4 g/dL (ref 3.6–5.1)
Alkaline phosphatase (APISO): 50 U/L (ref 37–153)
BUN/Creatinine Ratio: 39 (calc) — ABNORMAL HIGH (ref 6–22)
BUN: 27 mg/dL — ABNORMAL HIGH (ref 7–25)
CO2: 28 mmol/L (ref 20–32)
Calcium: 9.4 mg/dL (ref 8.6–10.4)
Chloride: 105 mmol/L (ref 98–110)
Creat: 0.7 mg/dL (ref 0.60–0.88)
GFR, Est African American: 89 mL/min/{1.73_m2} (ref >=60)
GFR, Est Non African American: 77 mL/min/{1.73_m2} (ref >=60)
Globulin: 2 g/dL (calc) (ref 1.9–3.7)
Glucose, Bld: 84 mg/dL (ref 65–99)
Potassium: 4 mmol/L (ref 3.5–5.3)
Sodium: 139 mmol/L (ref 135–146)
Total Bilirubin: 0.5 mg/dL (ref 0.2–1.2)
Total Protein: 6 g/dL — ABNORMAL LOW (ref 6.1–8.1)

## 2020-06-10 NOTE — Progress Notes (Signed)
Labs are all ok.  We'll discuss at her appt

## 2020-06-14 ENCOUNTER — Other Ambulatory Visit: Payer: Self-pay

## 2020-06-14 ENCOUNTER — Ambulatory Visit: Payer: Medicare Other | Admitting: Internal Medicine

## 2020-06-14 ENCOUNTER — Encounter: Payer: Self-pay | Admitting: Internal Medicine

## 2020-06-14 ENCOUNTER — Ambulatory Visit (INDEPENDENT_AMBULATORY_CARE_PROVIDER_SITE_OTHER): Payer: Medicare Other | Admitting: Internal Medicine

## 2020-06-14 VITALS — BP 118/62 | HR 71 | Temp 97.5°F | Ht 60.0 in | Wt 128.0 lb

## 2020-06-14 DIAGNOSIS — H3530 Unspecified macular degeneration: Secondary | ICD-10-CM | POA: Diagnosis not present

## 2020-06-14 DIAGNOSIS — I10 Essential (primary) hypertension: Secondary | ICD-10-CM | POA: Diagnosis not present

## 2020-06-14 DIAGNOSIS — K4090 Unilateral inguinal hernia, without obstruction or gangrene, not specified as recurrent: Secondary | ICD-10-CM

## 2020-06-14 DIAGNOSIS — I872 Venous insufficiency (chronic) (peripheral): Secondary | ICD-10-CM

## 2020-06-14 DIAGNOSIS — H5315 Visual distortions of shape and size: Secondary | ICD-10-CM | POA: Diagnosis not present

## 2020-06-14 DIAGNOSIS — H35453 Secondary pigmentary degeneration, bilateral: Secondary | ICD-10-CM | POA: Diagnosis not present

## 2020-06-14 DIAGNOSIS — D692 Other nonthrombocytopenic purpura: Secondary | ICD-10-CM

## 2020-06-14 DIAGNOSIS — H5319 Other subjective visual disturbances: Secondary | ICD-10-CM | POA: Diagnosis not present

## 2020-06-14 DIAGNOSIS — H35363 Drusen (degenerative) of macula, bilateral: Secondary | ICD-10-CM | POA: Diagnosis not present

## 2020-06-14 DIAGNOSIS — Z961 Presence of intraocular lens: Secondary | ICD-10-CM | POA: Diagnosis not present

## 2020-06-14 DIAGNOSIS — H353211 Exudative age-related macular degeneration, right eye, with active choroidal neovascularization: Secondary | ICD-10-CM | POA: Diagnosis not present

## 2020-06-14 DIAGNOSIS — H353122 Nonexudative age-related macular degeneration, left eye, intermediate dry stage: Secondary | ICD-10-CM | POA: Diagnosis not present

## 2020-06-14 NOTE — Progress Notes (Signed)
Location:  [facility] clinic Provider:  Annah Jasko L. Mariea Clonts, D.O., C.M.D.  Code Status: DNR Goals of Care:  Advanced Directives 06/14/2020  Does Patient Have a Medical Advance Directive? Yes  Type of Advance Directive Out of facility DNR (pink MOST or yellow form)  Does patient want to make changes to medical advance directive? No - Patient declined  Copy of [place] in Chart? -  Pre-existing out of facility DNR order (yellow form or pink MOST form) -     Chief Complaint  Patient presents with  . Medical Management of Chronic Issues    3 month follow up     HPI: Patient is a 87 y.o. female seen today for medical management of chronic diseases.    She has fluid in her right eye when she saw Dr. Posey Pronto so she's got an appt for Thursday for that.   She had noticed a decrease in her vision--no longer able to read the newspaper anymore.  She has a nice lighted reader she uses.    She wears her compression stockings.  She's also wearing a girdle.  Explained that her venous insufficiency is a chronic condition of aging so she will need the compression stockings ongoing.  she kept her weights am and hs.  Looks like she is around 123/124 in am and 125/126.     Asked about her mixed hyperlipidemia and what it meant so I explained it.  [name] had reviewed that, too.    She had a near fall when she didn't lock her walker fully.  Talks about her belly being more prominent and size 12 instead of 10.  She wants to diet but advised against this.     Past Medical History:  Diagnosis Date  . Cervicalgia   . Chest pain at rest 01/07/2012  . Displacement of cervical intervertebral disc without myelopathy   . Diverticulosis of colon (without mention of hemorrhage)   . Female stress incontinence   . GERD (gastroesophageal reflux disease)    no peds occasional pepcid  . Inguinal hernia without mention of obstruction or gangrene, unilateral or unspecified, (not specified as recurrent)    . Internal hemorrhoids without mention of complication   . Lumbago   . Macular degeneration (senile) of retina, unspecified   . Osteoarthrosis, unspecified whether generalized or localized, unspecified site   . Other and unspecified hyperlipidemia   . Pain in joint, hand   . Palpitations   . Reflux esophagitis   . Scoliosis (and kyphoscoliosis), idiopathic   . Senile osteoporosis   . Skin disorder   . Thyroid disease   . TIA (transient ischemic attack)   . Unruptured popliteal cyst 06/24/2014   Right knee   . Unspecified essential hypertension   . Unspecified glaucoma(365.9)   . Unspecified hypothyroidism   . Unspecified vitamin D deficiency     Past Surgical History:  Procedure Laterality Date  . ABDOMINAL HYSTERECTOMY  1975   Dr Mallie Mussel  . APPENDECTOMY  1988  . cardiolite myocardial perfusion study    . DOPPLER ECHOCARDIOGRAPHY    . EYE SURGERY Bilateral 2009   cataract removed right eye, Dr Charise Killian  . FOOT SURGERY  august 2013   Doran Durand, MD  . INGUINAL HERNIA REPAIR Bilateral    w/mesh  . INGUINAL HERNIA REPAIR Left 08/03/2017   Procedure: LAPAROSCOPIC LEFT INGUINAL HERNIA REPAIR WITH MESH;  Surgeon: Greer Pickerel, MD;  Location: [place];  Service: General;  Laterality: Left;  . INGUINAL  HERNIA REPAIR Right 08/03/2017   Procedure: HERNIA REPAIR RIGHT INGUINAL ADULT WITH MESH;  Surgeon: Greer Pickerel, MD;  Location: [place];  Service: General;  Laterality: Right;  . INGUINAL HERNIA REPAIR     Dr. Redmond Pulling 04-24-18  . INGUINAL HERNIA REPAIR Right 04/24/2018   Procedure: DIAGNOSTIC LAPAROSCOPIC, OPENREPAIR OF RECURRENT RIGHT INGUINAL HERNIA WITH MESH ERAS PATHWAY;  Surgeon: Greer Pickerel, MD;  Location: [facility];  Service: General;  Laterality: Right;  . INSERTION OF MESH Bilateral 08/03/2017   Procedure: INSERTION OF MESH;  Surgeon: Greer Pickerel, MD;  Location: [place];  Service: General;  Laterality: Bilateral;  . NM MYOVIEW LTD     negative  . TONSILLECTOMY  1937  . TOOTH EXTRACTION   09/16/13   Dr Carlos American    Allergies  Allergen Reactions  . Trimethoprim Other (See Comments)    Headaches/ ear pressure   . Erythromycin Other (See Comments)    UNSPECIFIED REACTION   . Macrodantin [Nitrofurantoin Macrocrystal] Other (See Comments)    UNSPECIFIED REACTION   . Penicillins Other (See Comments)    UNSPECIFIED REACTION  Has patient had a PCN reaction causing immediate rash, facial/tongue/throat swelling, SOB or lightheadedness with hypotension: Unknown Has patient had a PCN reaction causing severe rash involving mucus membranes or skin necrosis: Unknown Has patient had a PCN reaction that required hospitalization: No Has patient had a PCN reaction occurring within the last 10 years: No If all of the above answers are "NO", then may proceed with Cephalosporin use.  . Sulfa Antibiotics Other (See Comments)    UNSPECIFIED REACTION   . Latex Itching    Outpatient Encounter Medications as of 06/14/2020  Medication Sig  . acetaminophen (TYLENOL) 500 MG tablet Take 500 mg by mouth 2 (two) times daily as needed for mild pain or headache.   . bimatoprost (LUMIGAN) 0.01 % SOLN Place 1 drop into both eyes at bedtime.  . Calcium Carbonate-Vitamin D 600-400 MG-UNIT chew tablet Chew 1 tablet by mouth 2 (two) times daily.   . camphor-menthol (SARNA) lotion Apply 1 application topically as needed for itching. Prescribed by Dermatology at [facility]  . Cholecalciferol (VITAMIN D3) 50 MCG (2000 UT) TABS Take by mouth.  . clopidogrel (PLAVIX) 75 MG tablet TAKE 1 TABLET ONCE A DAY TO PREVENT STROKE.  Marland Kitchen CRANBERRY EXTRACT PO Take 1 tablet by mouth daily.  . famotidine (PEPCID AC) 10 MG chewable tablet Chew 10 mg by mouth at bedtime as needed for heartburn.  . hydrocortisone cream 1 % Apply 1 application topically daily as needed for itching.   . metoprolol succinate (TOPROL-XL) 25 MG 24 hr tablet TAKE 1 TABLET ONCE DAILY TO REGULATE HEART AND CONTROL BLOOD PRESSURE.  . Multiple  Vitamins-Minerals (PRESERVISION AREDS 2 PO) Take 1 capsule by mouth 2 (two) times daily.   Vladimir Faster Glycol-Propyl Glycol 0.4-0.3 % SOLN Place 1 vial into both eyes 2 (two) times daily. May use another dose during the day as needed for dry eye  . psyllium (METAMUCIL) 58.6 % packet Take 1 packet by mouth daily. Mix with water  . vitamin C (ASCORBIC ACID) 500 MG tablet Take 500 mg by mouth daily with supper.    No facility-administered encounter medications on file as of 06/14/2020.    Review of Systems:  Review of Systems  Constitutional: Negative for chills, fever and malaise/fatigue.  HENT: Positive for hearing loss. Negative for congestion and sore throat.   Eyes: Positive for blurred vision.  See hpi  Respiratory: Negative for cough and shortness of breath.   Cardiovascular: Positive for leg swelling. Negative for chest pain and palpitations.  Gastrointestinal: Negative for abdominal pain, blood in stool, constipation, diarrhea and melena.       Inguinal hernia right recurrent, uses girdle  Genitourinary: Negative for dysuria.  Musculoskeletal: Positive for falls. Negative for joint pain.  Neurological: Negative for dizziness and loss of consciousness.  Endo/Heme/Allergies: Bruises/bleeds easily.  Psychiatric/Behavioral: Positive for memory loss. Negative for depression. The patient is not nervous/anxious and does not have insomnia.     Health Maintenance  Topic Date Due  . COVID-19 Vaccine (3 - Booster for Moderna series) 12/14/2019  . TETANUS/TDAP  03/20/2020  . MAMMOGRAM  05/13/2020  . INFLUENZA VACCINE  Completed  . DEXA SCAN  Completed  . PNA vac Low Risk Adult  Completed    Physical Exam: Vitals:   06/14/20 1433  BP: 118/62  Pulse: 71  Temp: (!) 97.5 F (36.4 C)  SpO2: 97%  Weight: 128 lb (58.1 kg)  Height: 5' (1.524 m)   Body mass index is 25 kg/m. Physical Exam Vitals reviewed.  Constitutional:      General: She is not in acute distress.     Appearance: Normal appearance. She is not toxic-appearing.  HENT:     Head: Normocephalic and atraumatic.  Eyes:     Extraocular Movements: Extraocular movements intact.     Pupils: Pupils are equal, round, and reactive to light.     Comments: glasses  Cardiovascular:     Rate and Rhythm: Normal rate and regular rhythm.  Pulmonary:     Effort: Pulmonary effort is normal.     Breath sounds: Normal breath sounds. No wheezing, rhonchi or rales.  Abdominal:     General: Bowel sounds are normal.  Musculoskeletal:        General: Normal range of motion.     Right lower leg: Edema present.     Left lower leg: Edema present.     Comments: Doing fine with zipped compression stockings   Skin:    General: Skin is warm and dry.     Comments: Ecchymoses left arm from a prior fall and run-in with door frame  Neurological:     General: No focal deficit present.     Mental Status: She is alert and oriented to person, place, and time.     Gait: Gait abnormal.     Comments: Uses rollator walker with seat  Psychiatric:        Mood and Affect: Mood normal.        Behavior: Behavior normal.        Thought Content: Thought content normal.        Judgment: Judgment normal.     Labs reviewed: Basic Metabolic Panel: Recent Labs    07/10/19 0916 09/25/19 0846 06/09/20 0826  NA 139 138 139  K 4.0 4.2 4.0  CL 103 103 105  CO2 28 28 28   GLUCOSE 83 77 84  BUN 26* 32* 27*  CREATININE 0.70 0.78 0.70  CALCIUM 9.3 9.4 9.4   Liver Function Tests: Recent Labs    07/10/19 0916 09/25/19 0846 06/09/20 0826  AST 18 18 17   ALT 13 14 11   BILITOT 0.4 0.4 0.5  PROT 6.3 6.5 6.0*   No results for input(s): LIPASE, AMYLASE in the last 8760 hours. No results for input(s): AMMONIA in the last 8760 hours. CBC: Recent Labs    07/10/19 0916  06/09/20 0826  WBC 6.8 6.6  NEUTROABS 3,781 3,082  HGB 12.4 12.3  HCT 37.3 36.9  MCV 89.9 91.3  PLT 229 207   Lipid Panel: Recent Labs    07/10/19 0916   CHOL 188  HDL 75  LDLCALC 94  TRIG 96  CHOLHDL 2.5   Lab Results  Component Value Date   HGBA1C 5.3 05/02/2013     Assessment/Plan 1. Chronic venous insufficiency -cont compression hose, elevation at rest, low sodium diet  2. Essential hypertension -bp controlled with current regimen, cont same and monitor  3. Macular degeneration of both eyes, unspecified type -for injection for right eye due to fluid behind it now  4. Senile purpura (HCC) -counseled on causes of this with age and thinning skin, loss of collagen and elastin, plavix use  5. Unilateral inguinal hernia without obstruction or gangrene, recurrence not specified -continue use of girdle which has been effective at preventing progression, has no pain, but it will protrude if she does not put the girdle on first thing in am before her compression stockings  Labs/tests ordered:    Lab Orders  No laboratory test(s) ordered today   Next appt:  10/28/2020   Fateh Kindle L. Benita Boonstra, D.O. [facility] [address], [place] [address] Cell Phone (Mon-Fri 8am-5pm):  [phone] On Call:  [phone] & follow prompts after 5pm & weekends Office Phone:  [phone] Office Fax:  [phone]

## 2020-06-15 DIAGNOSIS — Z1231 Encounter for screening mammogram for malignant neoplasm of breast: Secondary | ICD-10-CM | POA: Diagnosis not present

## 2020-06-15 LAB — HM MAMMOGRAPHY

## 2020-06-16 ENCOUNTER — Encounter: Payer: Self-pay | Admitting: *Deleted

## 2020-06-17 DIAGNOSIS — H353211 Exudative age-related macular degeneration, right eye, with active choroidal neovascularization: Secondary | ICD-10-CM | POA: Diagnosis not present

## 2020-06-30 ENCOUNTER — Other Ambulatory Visit: Payer: Self-pay | Admitting: Internal Medicine

## 2020-06-30 DIAGNOSIS — G459 Transient cerebral ischemic attack, unspecified: Secondary | ICD-10-CM

## 2020-07-05 ENCOUNTER — Encounter: Payer: Self-pay | Admitting: Internal Medicine

## 2020-07-15 DIAGNOSIS — L821 Other seborrheic keratosis: Secondary | ICD-10-CM | POA: Diagnosis not present

## 2020-07-15 DIAGNOSIS — D692 Other nonthrombocytopenic purpura: Secondary | ICD-10-CM | POA: Diagnosis not present

## 2020-07-15 DIAGNOSIS — L570 Actinic keratosis: Secondary | ICD-10-CM | POA: Diagnosis not present

## 2020-07-15 DIAGNOSIS — D1801 Hemangioma of skin and subcutaneous tissue: Secondary | ICD-10-CM | POA: Diagnosis not present

## 2020-07-15 DIAGNOSIS — L814 Other melanin hyperpigmentation: Secondary | ICD-10-CM | POA: Diagnosis not present

## 2020-07-15 DIAGNOSIS — L820 Inflamed seborrheic keratosis: Secondary | ICD-10-CM | POA: Diagnosis not present

## 2020-07-21 DIAGNOSIS — H353211 Exudative age-related macular degeneration, right eye, with active choroidal neovascularization: Secondary | ICD-10-CM | POA: Diagnosis not present

## 2020-08-08 ENCOUNTER — Emergency Department (HOSPITAL_BASED_OUTPATIENT_CLINIC_OR_DEPARTMENT_OTHER)
Admission: EM | Admit: 2020-08-08 | Discharge: 2020-08-08 | Disposition: A | Discharge status: Home / Self Care | Payer: Medicare Other | Attending: Emergency Medicine | Admitting: Emergency Medicine

## 2020-08-08 ENCOUNTER — Encounter (HOSPITAL_BASED_OUTPATIENT_CLINIC_OR_DEPARTMENT_OTHER): Payer: Self-pay | Admitting: Obstetrics and Gynecology

## 2020-08-08 ENCOUNTER — Emergency Department (HOSPITAL_BASED_OUTPATIENT_CLINIC_OR_DEPARTMENT_OTHER): Payer: Medicare Other | Admitting: Radiology

## 2020-08-08 ENCOUNTER — Other Ambulatory Visit: Payer: Self-pay

## 2020-08-08 DIAGNOSIS — Z79899 Other long term (current) drug therapy: Secondary | ICD-10-CM | POA: Diagnosis not present

## 2020-08-08 DIAGNOSIS — N390 Urinary tract infection, site not specified: Secondary | ICD-10-CM | POA: Diagnosis not present

## 2020-08-08 DIAGNOSIS — J449 Chronic obstructive pulmonary disease, unspecified: Secondary | ICD-10-CM | POA: Diagnosis not present

## 2020-08-08 DIAGNOSIS — R319 Hematuria, unspecified: Secondary | ICD-10-CM | POA: Diagnosis not present

## 2020-08-08 DIAGNOSIS — Z20822 Contact with and (suspected) exposure to covid-19: Secondary | ICD-10-CM | POA: Diagnosis not present

## 2020-08-08 DIAGNOSIS — E039 Hypothyroidism, unspecified: Secondary | ICD-10-CM | POA: Diagnosis not present

## 2020-08-08 DIAGNOSIS — R251 Tremor, unspecified: Secondary | ICD-10-CM | POA: Insufficient documentation

## 2020-08-08 DIAGNOSIS — R509 Fever, unspecified: Secondary | ICD-10-CM | POA: Diagnosis not present

## 2020-08-08 DIAGNOSIS — Z7902 Long term (current) use of antithrombotics/antiplatelets: Secondary | ICD-10-CM | POA: Diagnosis not present

## 2020-08-08 DIAGNOSIS — R531 Weakness: Secondary | ICD-10-CM | POA: Diagnosis present

## 2020-08-08 DIAGNOSIS — D72829 Elevated white blood cell count, unspecified: Secondary | ICD-10-CM | POA: Insufficient documentation

## 2020-08-08 DIAGNOSIS — R5383 Other fatigue: Secondary | ICD-10-CM | POA: Diagnosis not present

## 2020-08-08 DIAGNOSIS — Z91040 Latex allergy status: Secondary | ICD-10-CM | POA: Insufficient documentation

## 2020-08-08 LAB — CBC WITH DIFFERENTIAL/PLATELET
Abs Immature Granulocytes: 0.03 10*3/uL (ref 0.00–0.07)
Basophils Absolute: 0.1 10*3/uL (ref 0.0–0.1)
Basophils Relative: 1 %
Eosinophils Absolute: 0.2 10*3/uL (ref 0.0–0.5)
Eosinophils Relative: 1 %
HCT: 38.7 % (ref 36.0–46.0)
Hemoglobin: 12.7 g/dL (ref 12.0–15.0)
Immature Granulocytes: 0 %
Lymphocytes Relative: 9 %
Lymphs Abs: 1.2 10*3/uL (ref 0.7–4.0)
MCH: 30.5 pg (ref 26.0–34.0)
MCHC: 32.8 g/dL (ref 30.0–36.0)
MCV: 92.8 fL (ref 80.0–100.0)
Monocytes Absolute: 0.7 10*3/uL (ref 0.1–1.0)
Monocytes Relative: 6 %
Neutro Abs: 10.6 10*3/uL — ABNORMAL HIGH (ref 1.7–7.7)
Neutrophils Relative %: 83 %
Platelets: 183 10*3/uL (ref 150–400)
RBC: 4.17 MIL/uL (ref 3.87–5.11)
RDW: 15.2 % (ref 11.5–15.5)
WBC: 12.7 10*3/uL — ABNORMAL HIGH (ref 4.0–10.5)
nRBC: 0 % (ref 0.0–0.2)

## 2020-08-08 LAB — COMPREHENSIVE METABOLIC PANEL
ALT: 13 U/L (ref 0–44)
AST: 19 U/L (ref 15–41)
Albumin: 4.1 g/dL (ref 3.5–5.0)
Alkaline Phosphatase: 44 U/L (ref 38–126)
Anion gap: 11 (ref 5–15)
BUN: 27 mg/dL — ABNORMAL HIGH (ref 8–23)
CO2: 24 mmol/L (ref 22–32)
Calcium: 9.2 mg/dL (ref 8.9–10.3)
Chloride: 102 mmol/L (ref 98–111)
Creatinine, Ser: 0.7 mg/dL (ref 0.44–1.00)
GFR, Estimated: >60 mL/min (ref >60)
Glucose, Bld: 106 mg/dL — ABNORMAL HIGH (ref 70–99)
Potassium: 4.3 mmol/L (ref 3.5–5.1)
Sodium: 137 mmol/L (ref 135–145)
Total Bilirubin: 0.6 mg/dL (ref 0.3–1.2)
Total Protein: 6.4 g/dL — ABNORMAL LOW (ref 6.5–8.1)

## 2020-08-08 LAB — URINALYSIS, ROUTINE W REFLEX MICROSCOPIC
Bilirubin Urine: NEGATIVE
Glucose, UA: NEGATIVE mg/dL
Hgb urine dipstick: NEGATIVE
Ketones, ur: NEGATIVE mg/dL
Nitrite: NEGATIVE
Protein, ur: NEGATIVE mg/dL
Specific Gravity, Urine: 1.014 (ref 1.005–1.030)
WBC, UA: >50 WBC/hpf — ABNORMAL HIGH (ref 0–5)
pH: 6.5 (ref 5.0–8.0)

## 2020-08-08 LAB — LACTIC ACID, PLASMA: Lactic Acid, Venous: 1.6 mmol/L (ref 0.5–1.9)

## 2020-08-08 MED ORDER — ACETAMINOPHEN 325 MG PO TABS
650.0000 mg | ORAL_TABLET | Freq: Once | ORAL | Status: AC
  Administered 2020-08-08: 650 mg via ORAL
  Filled 2020-08-08: qty 2

## 2020-08-08 MED ORDER — SODIUM CHLORIDE 0.9 % IV SOLN
1.0000 g | Freq: Once | INTRAVENOUS | Status: AC
  Administered 2020-08-08: 1 g via INTRAVENOUS
  Filled 2020-08-08: qty 10

## 2020-08-08 MED ORDER — CEPHALEXIN 500 MG PO CAPS: 500.0000 mg | ORAL_CAPSULE | Freq: Four times a day (QID) | ORAL | 0 refills | Status: DC

## 2020-08-08 NOTE — ED Provider Notes (Signed)
[facility] EMERGENCY DEPT Provider Note   CSN: [id number] Arrival date & time: 08/08/20  1945     History Chief Complaint  Patient presents with  . [name] is a 87 y.o. female.  Presents to ER after episode of shaking.  Patient states that she was in her normal state of health today.  Having a good day.  Was preparing ingredients for a baking recipe.  Watching the ball game with family.  She suddenly began shaking in her arms primarily as well as some in her legs.  Felt generally weak.  This has steadily improved since the episode.  [facility] resides at [facility] [place], and independent living facility and they checked her vital signs, forehead temperature which were reportedly normal.  Patient denies any cough, no nausea or vomiting, no abdominal pain.  No dysuria or hematuria.  No known sick contacts.  No vision changes or speech changes.  HPI     Past Medical History:  Diagnosis Date  . Cervicalgia   . Chest pain at rest 01/07/2012  . Displacement of cervical intervertebral disc without myelopathy   . Diverticulosis of colon (without mention of hemorrhage)   . Female stress incontinence   . GERD (gastroesophageal reflux disease)    no peds occasional pepcid  . Inguinal hernia without mention of obstruction or gangrene, unilateral or unspecified, (not specified as recurrent)   . Internal hemorrhoids without mention of complication   . Lumbago   . Macular degeneration (senile) of retina, unspecified   . Osteoarthrosis, unspecified whether generalized or localized, unspecified site   . Other and unspecified hyperlipidemia   . Pain in joint, hand   . Palpitations   . Reflux esophagitis   . Scoliosis (and kyphoscoliosis), idiopathic   . Senile osteoporosis   . Skin disorder   . Thyroid disease   . TIA (transient ischemic attack)   . Unruptured popliteal cyst 06/24/2014   Right knee   . Unspecified essential hypertension   . Unspecified  glaucoma(365.9)   . Unspecified hypothyroidism   . Unspecified vitamin D deficiency     Patient Active Problem List   Diagnosis Date Noted  . Right groin hernia 03/10/2019  . Macular degeneration of both eyes 03/10/2019  . Senile osteoporosis 03/10/2019  . Transient cerebral ischemia 03/10/2019  . S/P right inguinal hernia repair 04/24/2018  . Contusion, hip 03/20/2018  . Overactive bladder 02/25/2018  . Chronic venous insufficiency 10/15/2017  . Chronic cystitis 09/19/2017  . Unspecified glaucoma 09/12/2017  . S/P bilateral inguinal hernia repair 08/03/2017  . Advance care planning 02/01/2017  . Right hip pain 08/29/2016  . Pain of right sacroiliac joint 06/27/2016  . Edema 03/08/2016  . Right shoulder pain 03/10/2015  . Corn 12/30/2014  . Unruptured popliteal cyst 06/24/2014  . Female stress incontinence   . Inguinal hernia 12/24/2013  . Balance problem 12/24/2013  . Brain aneurysm 05/27/2013  . TIA (transient ischemic attack) 05/02/2013  . GERD (gastroesophageal reflux disease) 03/24/2013  . Hypothyroidism   . Unspecified vitamin D deficiency   . Macular degeneration   . Unspecified glaucoma(365.9)   . Essential hypertension   . Low back pain radiating to left lower extremity   . Hyperlipidemia     Past Surgical History:  Procedure Laterality Date  . ABDOMINAL HYSTERECTOMY  1975   Dr Mallie Mussel  . APPENDECTOMY  1988  . cardiolite myocardial perfusion study    . DOPPLER ECHOCARDIOGRAPHY    .  EYE SURGERY Bilateral 2009   cataract removed right eye, Dr Charise Killian  . FOOT SURGERY  august 2013   Doran Durand, MD  . INGUINAL HERNIA REPAIR Bilateral    w/mesh  . INGUINAL HERNIA REPAIR Left 08/03/2017   Procedure: LAPAROSCOPIC LEFT INGUINAL HERNIA REPAIR WITH MESH;  Surgeon: Greer Pickerel, MD;  Location: [place];  Service: General;  Laterality: Left;  . INGUINAL HERNIA REPAIR Right 08/03/2017   Procedure: HERNIA REPAIR RIGHT INGUINAL ADULT WITH MESH;  Surgeon: Greer Pickerel, MD;   Location: [facility];  Service: General;  Laterality: Right;  . INGUINAL HERNIA REPAIR     Dr. Redmond Pulling 04-24-18  . INGUINAL HERNIA REPAIR Right 04/24/2018   Procedure: DIAGNOSTIC LAPAROSCOPIC, OPENREPAIR OF RECURRENT RIGHT INGUINAL HERNIA WITH MESH ERAS PATHWAY;  Surgeon: Greer Pickerel, MD;  Location[facility];  Service: General;  Laterality: Right;  . INSERTION OF MESH Bilateral 08/03/2017   Procedure: INSERTION OF MESH;  Surgeon: Greer Pickerel, MD;  Location: [place];  Service: General;  Laterality: Bilateral;  . NM MYOVIEW LTD     negative  . TONSILLECTOMY  1937  . TOOTH EXTRACTION  09/16/13   Dr Carlos American     OB History   No obstetric history on file.     Family History  Problem Relation Age of Onset  . CAD Mother   . Parkinson's disease Mother   . Cancer Father        colon cancer  . Parkinson's disease Father   . Stroke Maternal Grandmother     Social History   Tobacco Use  . Smoking status: Never Smoker  . Smokeless tobacco: Never Used  Vaping Use  . Vaping Use: Never used  Substance Use Topics  . Alcohol use: No  . Drug use: No    Home Medications Prior to Admission medications   Medication Sig Start Date End Date Taking? Authorizing Provider  cephALEXin (KEFLEX) 500 MG capsule Take 1 capsule (500 mg total) by mouth 4 (four) times daily. 08/08/20  Yes Lucrezia Starch, MD  acetaminophen (TYLENOL) 500 MG tablet Take 500 mg by mouth 2 (two) times daily as needed for mild pain or headache.     [provider]  bimatoprost (LUMIGAN) 0.01 % SOLN Place 1 drop into both eyes at bedtime.    [provider]  Calcium Carbonate-Vitamin D 600-400 MG-UNIT chew tablet Chew 1 tablet by mouth 2 (two) times daily.     [provider]  camphor-menthol Timoteo Ace) lotion Apply 1 application topically as needed for itching. Prescribed by Dermatology at [facility]    [provider]  Cholecalciferol (VITAMIN D3) 50 MCG (2000 UT) TABS Take by mouth.    [provider]  clopidogrel (PLAVIX) 75 MG tablet TAKE 1 TABLET ONCE A DAY TO PREVENT STROKE. 07/01/20   Reed, Tiffany L, DO  CRANBERRY EXTRACT PO Take 1 tablet by mouth daily.    [provider]  famotidine (PEPCID AC) 10 MG chewable tablet Chew 10 mg by mouth at bedtime as needed for heartburn.    [provider]  hydrocortisone cream 1 % Apply 1 application topically daily as needed for itching.     [provider]  metoprolol succinate (TOPROL-XL) 25 MG 24 hr tablet TAKE 1 TABLET ONCE DAILY TO REGULATE HEART AND CONTROL BLOOD PRESSURE. 05/17/20   Reed, Tiffany L, DO  Multiple Vitamins-Minerals (PRESERVISION AREDS 2 PO) Take 1 capsule by mouth 2 (two) times daily.     [provider]  Polyethyl Glycol-Propyl Glycol 0.4-0.3 % SOLN Place 1 vial into both eyes 2 (two) times daily. May use another dose during the day as needed for dry eye    [provider]  psyllium (METAMUCIL) 58.6 % packet Take 1 packet by mouth daily. Mix with water    [provider]  vitamin C (ASCORBIC ACID) 500 MG tablet Take 500 mg by mouth daily with supper.     [provider]    Allergies    Trimethoprim, Erythromycin, Macrodantin [nitrofurantoin macrocrystal], Penicillins, Sulfa antibiotics, and Latex  Review of Systems   Review of Systems  Constitutional: Positive for fatigue and fever. Negative for chills.  HENT: Negative for ear pain and sore throat.   Eyes: Negative for pain and visual disturbance.  Respiratory: Negative for cough and shortness of breath.   Cardiovascular: Negative for chest pain and palpitations.  Gastrointestinal: Negative for abdominal pain and vomiting.  Genitourinary: Negative for dysuria and hematuria.  Musculoskeletal: Negative for arthralgias and back pain.  Skin: Negative for color change and rash.  Neurological: Positive for tremors. Negative for seizures and syncope.  All other systems reviewed and are  negative.   Physical Exam Updated Vital Signs BP (!) 136/57   Pulse 77   Temp 98.5 F (36.9 C) (Oral)   Resp 17   Ht 5' (1.524 m)   Wt 59 kg   SpO2 98%   BMI 25.39 kg/m   Physical Exam Vitals and nursing note reviewed.  Constitutional:      General: She is not in acute distress.    Appearance: She is well-developed.  HENT:     Head: Normocephalic and atraumatic.  Eyes:     Conjunctiva/sclera: Conjunctivae normal.  Cardiovascular:     Rate and Rhythm: Normal rate and regular rhythm.     Heart sounds: No murmur heard.   Pulmonary:     Effort: Pulmonary effort is normal. No respiratory distress.     Breath sounds: Normal breath sounds.  Abdominal:     Palpations: Abdomen is soft.     Tenderness: There is no abdominal tenderness.  Musculoskeletal:        General: No deformity or signs of injury.     Cervical back: Neck supple.  Skin:    General: Skin is warm and dry.  Neurological:     General: No focal deficit present.     Mental Status: She is alert and oriented to person, place, and time.  Psychiatric:        Mood and Affect: Mood normal.        Behavior: Behavior normal.     ED Results / Procedures / Treatments   Labs (all labs ordered are listed, but only abnormal results are displayed) Labs Reviewed  COMPREHENSIVE METABOLIC PANEL - Abnormal; Notable for the following components:      Result Value   Glucose, Bld 106 (*)    BUN 27 (*)    Total Protein 6.4 (*)    All other components within normal limits  CBC WITH DIFFERENTIAL/PLATELET - Abnormal; Notable for the following components:   WBC 12.7 (*)    Neutro Abs 10.6 (*)    All other components within normal limits  URINALYSIS, ROUTINE W REFLEX MICROSCOPIC - Abnormal; Notable for the following components:   Leukocytes,Ua MODERATE (*)    WBC, UA >50 (*)    Bacteria, UA MANY (*)    Crystals PRESENT (*)    All other components within normal  limits  CULTURE, BLOOD (ROUTINE X 2)  CULTURE, BLOOD  (ROUTINE X 2) W REFLEX TO ID PANEL  URINE CULTURE  RESP PANEL BY RT-PCR (FLU A&B, COVID) ARPGX2  LACTIC ACID, PLASMA  LACTIC ACID, PLASMA    EKG None  Radiology DG Chest 1 View  Result Date: 08/08/2020 CLINICAL DATA:  Fever EXAM: CHEST  1 VIEW COMPARISON:  05/02/2013 FINDINGS: The heart size and mediastinal contours are within normal limits. Chronically coarsened interstitial markings and hyperinflation. No focal airspace consolidation, pleural effusion, or pneumothorax. The visualized skeletal structures are unremarkable. IMPRESSION: 1. No active disease. 2. COPD. Electronically Signed   By: Davina Poke D.O.   On: 08/08/2020 20:27    Procedures Procedures   Medications Ordered in ED Medications  cefTRIAXone (ROCEPHIN) 1 g in sodium chloride 0.9 % 100 mL IVPB (1 g Intravenous New Bag/Given 08/08/20 2139)  acetaminophen (TYLENOL) tablet 650 mg (650 mg Oral Given 08/08/20 2014)    ED Course  I have reviewed the triage vital signs and the nursing notes.  Pertinent labs & imaging results that were available during my care of the patient were reviewed by me and considered in my medical decision making (see chart for details).    MDM Rules/Calculators/A&P                          87 year old lady who presents to ER after episode of shaking.  On exam, patient was noted to be well-appearing in no distress.  Vital signs were noted for temperature of 100.4 F.  Completed infectious work-up.  Basic labs were noted for mild leukocytosis but otherwise within normal limits.  Urinalysis worrisome for possible UTI.  CXR negative.  Provided dose of IV antibiotics and observed in ER.  She had no recurrence of fever, has no ongoing medical complaints.  Given her current clinical picture, believe she is appropriate for outpatient management.  Reviewed return precautions, recommended course of antibiotics and follow-up with primary doctor.   After the discussed management above, the patient was  determined to be safe for discharge.  The patient was in agreement with this plan and all questions regarding their care were answered.  ED return precautions were discussed and the patient will return to the ED with any significant worsening of condition.   Final Clinical Impression(s) / ED Diagnoses Final diagnoses:  Urinary tract infection with hematuria, site unspecified  Leukocytosis, unspecified type    Rx / DC Orders ED Discharge Orders         Ordered    cephALEXin (KEFLEX) 500 MG capsule  4 times daily        08/08/20 2151           Lucrezia Starch, MD 08/08/20 2209

## 2020-08-08 NOTE — ED Triage Notes (Signed)
Patient reports to the ER for shaking. Patient reports she was sitting watching the ball game with her daughter and she had walked up the hall and suddenly started to shake. Patient had her vitals checked by staff at [facility]. [facility] CBG [facility] at [facility].

## 2020-08-08 NOTE — Discharge Instructions (Addendum)
Please follow-up with your primary doctor for recheck in the next couple days.  Take the antibiotic as prescribed.  You develop vomiting, abdominal pain, recurrent fevers or other new concerning symptom, return to ER for reassessment.

## 2020-08-09 ENCOUNTER — Other Ambulatory Visit: Payer: Self-pay | Admitting: Internal Medicine

## 2020-08-09 DIAGNOSIS — I10 Essential (primary) hypertension: Secondary | ICD-10-CM

## 2020-08-09 LAB — RESP PANEL BY RT-PCR (FLU A&B, COVID) ARPGX2
Influenza A by PCR: NEGATIVE
Influenza B by PCR: NEGATIVE
SARS Coronavirus 2 by RT PCR: NEGATIVE

## 2020-08-11 LAB — URINE CULTURE: Culture: >=100000 — AB

## 2020-08-13 LAB — CULTURE, BLOOD (ROUTINE X 2)
Culture: NO GROWTH
Culture: NO GROWTH
Special Requests: ADEQUATE
Special Requests: ADEQUATE

## 2020-08-18 ENCOUNTER — Other Ambulatory Visit: Payer: Self-pay

## 2020-08-18 ENCOUNTER — Ambulatory Visit (INDEPENDENT_AMBULATORY_CARE_PROVIDER_SITE_OTHER): Payer: Medicare Other | Admitting: Family Medicine

## 2020-08-18 ENCOUNTER — Encounter: Payer: Self-pay | Admitting: Family Medicine

## 2020-08-18 VITALS — BP 160/100 | HR 58 | Temp 97.7°F | Resp 20 | Ht 60.0 in | Wt 129.0 lb

## 2020-08-18 DIAGNOSIS — Z9229 Personal history of other drug therapy: Secondary | ICD-10-CM

## 2020-08-18 DIAGNOSIS — I10 Essential (primary) hypertension: Secondary | ICD-10-CM

## 2020-08-18 DIAGNOSIS — G459 Transient cerebral ischemic attack, unspecified: Secondary | ICD-10-CM | POA: Diagnosis not present

## 2020-08-18 NOTE — Progress Notes (Signed)
Provider:  Alain Honey, MD  Careteam: Patient Care Team: Wardell Honour, MD as PCP - General (Family Medicine) Lorretta Harp, MD as PCP - Cardiology (Cardiology) Laurence Spates, MD (Inactive) as Consulting Physician (Gastroenterology) Myrlene Broker, MD as Attending Physician (Urology) Lorretta Harp, MD as Consulting Physician (Cardiology) Rozetta Nunnery, MD as Consulting Physician (Otolaryngology) Richarda Osmond, [place] (Dentistry) Shon Hough, MD as Consulting Physician (Ophthalmology) Iran Planas, MD as Consulting Physician (Orthopedic Surgery) Greer Pickerel, MD as Consulting Physician (General Surgery)  [name]:  [facility]  Advanced Directive information Does Patient Have a Medical Advance Directive?: Yes, Type of Advance Directive: Healthcare Power of [place];Living will;Out of facility DNR (pink MOST or yellow form), Pre-existing out of facility DNR order (yellow form or pink MOST form): Yellow form placed in chart (order not valid for inpatient use), Does patient want to make changes to medical advance directive?: No - Patient declined  Allergies  Allergen Reactions  . Trimethoprim Other (See Comments)    Headaches/ ear pressure   . Erythromycin Other (See Comments)    UNSPECIFIED REACTION   . Macrodantin [Nitrofurantoin Macrocrystal] Other (See Comments)    UNSPECIFIED REACTION   . Penicillins Other (See Comments)    UNSPECIFIED REACTION  Has patient had a PCN reaction causing immediate rash, facial/tongue/throat swelling, SOB or lightheadedness with hypotension: Unknown Has patient had a PCN reaction causing severe rash involving mucus membranes or skin necrosis: Unknown Has patient had a PCN reaction that required hospitalization: No Has patient had a PCN reaction occurring within the last 10 years: No If all of the above answers are "NO", then may proceed with Cephalosporin use.  . Sulfa Antibiotics Other (See Comments)     UNSPECIFIED REACTION   . Latex Itching    Chief Complaint  Patient presents with  . Hospitalization Follow-up    ED Follow Up  . Immunizations    Discuss T-Dap     HPI: Patient is a 87 y.o. female she is a resident of friend's home where is living in independent living.  While watching the [place] game on TV last week she began having some trembling or tremor.  She has had no history of this previously no history of movement disorder.  There was no change in cognition or other symptoms.  She was advised to go to the emergency room.  There they did some blood work which showed normal sugar calcium sodium etc.  She left with a diagnosis of urinary tract infection and was treated for that.  She has not had similar symptoms since that time.  Review of Systems:  Review of Systems  Constitutional: Negative.   HENT: Negative.   Eyes: Negative.   Respiratory: Negative.   Cardiovascular: Negative.   Gastrointestinal: Negative.   Genitourinary: Negative.   Musculoskeletal: Negative.   Neurological: Negative.   Endo/Heme/Allergies: Negative.   Psychiatric/Behavioral: Negative.   All other systems reviewed and are negative.   Past Medical History:  Diagnosis Date  . Cervicalgia   . Chest pain at rest 01/07/2012  . Displacement of cervical intervertebral disc without myelopathy   . Diverticulosis of colon (without mention of hemorrhage)   . Female stress incontinence   . GERD (gastroesophageal reflux disease)    no peds occasional pepcid  . Inguinal hernia without mention of obstruction or gangrene, unilateral or unspecified, (not specified as recurrent)   . Internal hemorrhoids without mention of complication   . Lumbago   .  Macular degeneration (senile) of retina, unspecified   . Osteoarthrosis, unspecified whether generalized or localized, unspecified site   . Other and unspecified hyperlipidemia   . Pain in joint, hand   . Palpitations   . Reflux esophagitis   .  Scoliosis (and kyphoscoliosis), idiopathic   . Senile osteoporosis   . Skin disorder   . Thyroid disease   . TIA (transient ischemic attack)   . Unruptured popliteal cyst 06/24/2014   Right knee   . Unspecified essential hypertension   . Unspecified glaucoma(365.9)   . Unspecified hypothyroidism   . Unspecified vitamin D deficiency    Past Surgical History:  Procedure Laterality Date  . ABDOMINAL HYSTERECTOMY  1975   Dr Mallie Mussel  . APPENDECTOMY  1988  . cardiolite myocardial perfusion study    . DOPPLER ECHOCARDIOGRAPHY    . EYE SURGERY Bilateral 2009   cataract removed right eye, Dr Charise Killian  . FOOT SURGERY  august 2013   Doran Durand, MD  . INGUINAL HERNIA REPAIR Bilateral    w/mesh  . INGUINAL HERNIA REPAIR Left 08/03/2017   Procedure: LAPAROSCOPIC LEFT INGUINAL HERNIA REPAIR WITH MESH;  Surgeon: Greer Pickerel, MD;  Location: [facility];  Service: General;  Laterality: Left;  . INGUINAL HERNIA REPAIR Right 08/03/2017   Procedure: HERNIA REPAIR RIGHT INGUINAL ADULT WITH MESH;  Surgeon: Greer Pickerel, MD;  Location: [facility];  Service: General;  Laterality: Right;  . INGUINAL HERNIA REPAIR     Dr. Redmond Pulling 04-24-18  . INGUINAL HERNIA REPAIR Right 04/24/2018   Procedure: DIAGNOSTIC LAPAROSCOPIC, OPENREPAIR OF RECURRENT RIGHT INGUINAL HERNIA WITH MESH ERAS PATHWAY;  Surgeon: Greer Pickerel, MD;  Location: [facility];  Service: General;  Laterality: Right;  . INSERTION OF MESH Bilateral 08/03/2017   Procedure: INSERTION OF MESH;  Surgeon: Greer Pickerel, MD;  Location: [facility];  Service: General;  Laterality: Bilateral;  . NM MYOVIEW LTD     negative  . TONSILLECTOMY  1937  . TOOTH EXTRACTION  09/16/13   Dr Carlos American   Social History:   reports that she has never smoked. She has never used smokeless tobacco. She reports that she does not drink alcohol and does not use drugs.  Family History  Problem Relation Age of Onset  . CAD Mother   . Parkinson's disease Mother   . Cancer Father        colon cancer  .  Parkinson's disease Father   . Stroke Maternal Grandmother     Medications: Patient's Medications  New Prescriptions   No medications on file  Previous Medications   ACETAMINOPHEN (TYLENOL) 500 MG TABLET    Take 500 mg by mouth 2 (two) times daily as needed for mild pain or headache.    BIMATOPROST (LUMIGAN) 0.01 % SOLN    Place 1 drop into both eyes at bedtime.   CALCIUM CARBONATE-VITAMIN D 600-400 MG-UNIT CHEW TABLET    Chew 1 tablet by mouth 2 (two) times daily.    CAMPHOR-MENTHOL (SARNA) LOTION    Apply 1 application topically as needed for itching. Prescribed by Dermatology at [facility] (VITAMIN D3) 50 MCG (2000 UT) TABS    Take by mouth.   CLOPIDOGREL (PLAVIX) 75 MG TABLET    TAKE 1 TABLET ONCE A DAY TO PREVENT STROKE.   CRANBERRY EXTRACT PO    Take 2 tablets by mouth daily as needed.   FAMOTIDINE (PEPCID AC) 10 MG CHEWABLE TABLET    Chew 10 mg by mouth at bedtime as needed  for heartburn.   HYDROCORTISONE CREAM 1 %    Apply 1 application topically daily as needed for itching.    METOPROLOL SUCCINATE (TOPROL-XL) 25 MG 24 HR TABLET    TAKE 1 TABLET ONCE DAILY TO REGULATE HEART AND CONTROL BLOOD PRESSURE.   MULTIPLE VITAMINS-MINERALS (PRESERVISION AREDS 2 PO)    Take 1 capsule by mouth 2 (two) times daily.    POLYETHYL GLYCOL-PROPYL GLYCOL 0.4-0.3 % SOLN    Place 1 vial into both eyes 2 (two) times daily. May use another dose during the day as needed for dry eye   PSYLLIUM (METAMUCIL) 58.6 % PACKET    Take 1 packet by mouth daily. Mix with water   VITAMIN C (ASCORBIC ACID) 500 MG TABLET    Take 500 mg by mouth daily with supper.   Modified Medications   No medications on file  Discontinued Medications   CEPHALEXIN (KEFLEX) 500 MG CAPSULE    Take 1 capsule (500 mg total) by mouth 4 (four) times daily.    Physical Exam:  Vitals:   08/18/20 0825  BP: (!) 160/100  Pulse: (!) 58  Resp: 20  Temp: 97.7 F (36.5 C)  TempSrc: Temporal  SpO2: 98%  Weight: 129 lb  (58.5 kg)  Height: 5' (1.524 m)   Body mass index is 25.19 kg/m. Wt Readings from Last 3 Encounters:  08/18/20 129 lb (58.5 kg)  08/08/20 130 lb (59 kg)  06/14/20 128 lb (58.1 kg)    Physical Exam Vitals and nursing note reviewed. Exam conducted with a chaperone present.  Constitutional:      Appearance: Normal appearance.  HENT:     Head: Normocephalic.  Eyes:     Extraocular Movements: Extraocular movements intact.     Pupils: Pupils are equal, round, and reactive to light.  Cardiovascular:     Rate and Rhythm: Normal rate and regular rhythm.  Pulmonary:     Effort: Pulmonary effort is normal.  Neurological:     General: No focal deficit present.     Mental Status: She is alert and oriented to person, place, and time.     Comments: There is no tremor either at rest or with intention today. Rapid finger tapping is normal as is other cerebellar testing.     Labs reviewed: Basic Metabolic Panel: Recent Labs    09/25/19 0846 06/09/20 0826 08/08/20 2007  NA 138 139 137  K 4.2 4.0 4.3  CL 103 105 102  CO2 28 28 24   GLUCOSE 77 84 106*  BUN 32* 27* 27*  CREATININE 0.78 0.70 0.70  CALCIUM 9.4 9.4 9.2   Liver Function Tests: Recent Labs    09/25/19 0846 06/09/20 0826 08/08/20 2007  AST 18 17 19   ALT 14 11 13   ALKPHOS  --   --  44  BILITOT 0.4 0.5 0.6  PROT 6.5 6.0* 6.4*  ALBUMIN  --   --  4.1   No results for input(s): LIPASE, AMYLASE in the last 8760 hours. No results for input(s): AMMONIA in the last 8760 hours. CBC: Recent Labs    06/09/20 0826 08/08/20 2007  WBC 6.6 12.7*  NEUTROABS 3,082 10.6*  HGB 12.3 12.7  HCT 36.9 38.7  MCV 91.3 92.8  PLT 207 183   Lipid Panel: No results for input(s): CHOL, HDL, LDLCALC, TRIG, CHOLHDL, LDLDIRECT in the last 8760 hours. TSH: No results for input(s): TSH in the last 8760 hours. A1C: Lab Results  Component Value Date   HGBA1C 5.3  05/02/2013     Assessment/Plan  2. Essential hypertension Blood  pressure is elevated today but in her haste to get here she forgot to take her medication.  Normally blood pressure is well controlled on last 1 recorded in the system was 118/62.  Continue with metoprolol as before  3. TIA (transient ischemic attack) She has had no further signs or symptoms of TIA or stroke.  She continues on Plavix which is well-tolerated. One possibility of the tremor she had was an adrenaline rush while watching the exciting basketball game.  Since she has had no prior symptoms and none since then I think that is as good an explanation as UTI for her symptoms.   Alain Honey, MD [facility] [phone]

## 2020-08-18 NOTE — Patient Instructions (Signed)
Continue same meds

## 2020-08-19 DIAGNOSIS — L820 Inflamed seborrheic keratosis: Secondary | ICD-10-CM | POA: Diagnosis not present

## 2020-08-19 DIAGNOSIS — L814 Other melanin hyperpigmentation: Secondary | ICD-10-CM | POA: Diagnosis not present

## 2020-08-19 DIAGNOSIS — L821 Other seborrheic keratosis: Secondary | ICD-10-CM | POA: Diagnosis not present

## 2020-08-26 DIAGNOSIS — H353211 Exudative age-related macular degeneration, right eye, with active choroidal neovascularization: Secondary | ICD-10-CM | POA: Diagnosis not present

## 2020-09-09 ENCOUNTER — Encounter: Payer: Self-pay | Admitting: Orthopedic Surgery

## 2020-09-09 ENCOUNTER — Other Ambulatory Visit: Payer: Self-pay

## 2020-09-09 ENCOUNTER — Ambulatory Visit (INDEPENDENT_AMBULATORY_CARE_PROVIDER_SITE_OTHER): Payer: Medicare Other | Admitting: Orthopedic Surgery

## 2020-09-09 VITALS — BP 119/72 | HR 79 | Temp 97.5°F | Resp 20 | Ht 60.0 in | Wt 129.8 lb

## 2020-09-09 DIAGNOSIS — R300 Dysuria: Secondary | ICD-10-CM | POA: Diagnosis not present

## 2020-09-09 DIAGNOSIS — R350 Frequency of micturition: Secondary | ICD-10-CM

## 2020-09-09 LAB — POCT URINALYSIS DIPSTICK
Bilirubin, UA: NEGATIVE
Blood, UA: NEGATIVE
Glucose, UA: NEGATIVE
Ketones, UA: NEGATIVE
Nitrite, UA: NEGATIVE
Protein, UA: NEGATIVE
Spec Grav, UA: 1.01 (ref 1.010–1.025)
Urobilinogen, UA: 0.2 E.U./dL
pH, UA: 6 (ref 5.0–8.0)

## 2020-09-09 NOTE — Patient Instructions (Signed)
Continue cranberry supplement. Encourage hydration. Will call you with urine culture results when they are in.   Urinary Tract Infection, Adult A urinary tract infection (UTI) is an infection of any part of the urinary tract. The urinary tract includes:  The kidneys.  The ureters.  The bladder.  The urethra. These organs make, store, and get rid of pee (urine) in the body. What are the causes? This infection is caused by germs (bacteria) in your genital area. These germs grow and cause swelling (inflammation) of your urinary tract. What increases the risk? The following factors may make you more likely to develop this condition:  Using a small, thin tube (catheter) to drain pee.  Not being able to control when you pee or poop (incontinence).  Being female. If you are female, these things can increase the risk: ? Using these methods to prevent pregnancy:  A medicine that kills sperm (spermicide).  A device that blocks sperm (diaphragm). ? Having low levels of a female hormone (estrogen). ? Being pregnant. You are more likely to develop this condition if:  You have genes that add to your risk.  You are sexually active.  You take antibiotic medicines.  You have trouble peeing because of: ? A prostate that is bigger than normal, if you are female. ? A blockage in the part of your body that drains pee from the bladder. ? A kidney stone. ? A nerve condition that affects your bladder. ? Not getting enough to drink. ? Not peeing often enough.  You have other conditions, such as: ? Diabetes. ? A weak disease-fighting system (immune system). ? Sickle cell disease. ? Gout. ? Injury of the spine. What are the signs or symptoms? Symptoms of this condition include:  Needing to pee right away.  Peeing small amounts often.  Pain or burning when peeing.  Blood in the pee.  Pee that smells bad or not like normal.  Trouble peeing.  Pee that is cloudy.  Fluid coming  from the vagina, if you are female.  Pain in the belly or lower back. Other symptoms include:  Vomiting.  Not feeling hungry.  Feeling mixed up (confused). This may be the first symptom in older adults.  Being tired and grouchy (irritable).  A fever.  Watery poop (diarrhea). How is this treated?  Taking antibiotic medicine.  Taking other medicines.  Drinking enough water. In some cases, you may need to see a specialist. Follow these instructions at home: Medicines  Take over-the-counter and prescription medicines only as told by your doctor.  If you were prescribed an antibiotic medicine, take it as told by your doctor. Do not stop taking it even if you start to feel better. General instructions  Make sure you: ? Pee until your bladder is empty. ? Do not hold pee for a long time. ? Empty your bladder after sex. ? Wipe from front to back after peeing or pooping if you are a female. Use each tissue one time when you wipe.  Drink enough fluid to keep your pee pale yellow.  Keep all follow-up visits.   Contact a doctor if:  You do not get better after 1-2 days.  Your symptoms go away and then come back. Get help right away if:  You have very bad back pain.  You have very bad pain in your lower belly.  You have a fever.  You have chills.  You feeling like you will vomit or you vomit. Summary  A urinary tract  infection (UTI) is an infection of any part of the urinary tract.  This condition is caused by germs in your genital area.  There are many risk factors for a UTI.  Treatment includes antibiotic medicines.  Drink enough fluid to keep your pee pale yellow. This information is not intended to replace advice given to you by your health care provider. Make sure you discuss any questions you have with your health care provider. Document Revised: 12/12/2019 Document Reviewed: 12/12/2019 Elsevier Patient Education  [employer].

## 2020-09-09 NOTE — Progress Notes (Signed)
Careteam: Patient Care Team: Frederica Kuster, MD as PCP - General (Family Medicine) Runell Gess, MD as PCP - Cardiology (Cardiology) Carman Ching, MD (Inactive) as Consulting Physician (Gastroenterology) Esaw Dace, MD as Attending Physician (Urology) Runell Gess, MD as Consulting Physician (Cardiology) Drema Halon, MD as Consulting Physician (Otolaryngology) Elizebeth Koller, DDS (Dentistry) Mckinley Jewel, MD as Consulting Physician (Ophthalmology) Bradly Bienenstock, MD as Consulting Physician (Orthopedic Surgery) Gaynelle Adu, MD as Consulting Physician (General Surgery)  Seen by: Hazle Nordmann, AGNP-C  [name]:  [facility]  Advanced Directive information Does Patient Have a Medical Advance Directive?: Yes, Type of Advance Directive: Healthcare Power of Jay;Living will;Out of facility DNR (pink MOST or yellow form), Pre-existing out of facility DNR order (yellow form or pink MOST form): Yellow form placed in chart (order not valid for inpatient use), Does patient want to make changes to medical advance directive?: No - Patient declined  Allergies  Allergen Reactions  . Trimethoprim Other (See Comments)    Headaches/ ear pressure   . Erythromycin Other (See Comments)    UNSPECIFIED REACTION   . Macrodantin [Nitrofurantoin Macrocrystal] Other (See Comments)    UNSPECIFIED REACTION   . Penicillins Other (See Comments)    UNSPECIFIED REACTION  Has patient had a PCN reaction causing immediate rash, facial/tongue/throat swelling, SOB or lightheadedness with hypotension: Unknown Has patient had a PCN reaction causing severe rash involving mucus membranes or skin necrosis: Unknown Has patient had a PCN reaction that required hospitalization: No Has patient had a PCN reaction occurring within the last 10 years: No If all of the above answers are "NO", then may proceed with Cephalosporin use.  . Sulfa Antibiotics Other (See Comments)     UNSPECIFIED REACTION   . Latex Itching    Chief Complaint  Patient presents with  . Acute Visit    Possible UTI      HPI: Patient is a 87 y.o. female seen today for acute visit for urinary frequency and dysuria.   Symptoms began a few days ago. Initially she had some burning, but urinary frequency has been more bothersome. Takes cranberry supplement daily and drinks cranberry juice. Admits to frequent pad change and cotton panties.  03/27 she presented to [facility] for symptoms of UTI. Urine positive for E.Coli, she was given a 5 day course of Keflex. She completed antibiotics.     Review of Systems:  Review of Systems  Constitutional: Negative for chills and fever.  Respiratory: Negative for cough, shortness of breath and wheezing.   Cardiovascular: Negative for chest pain and leg swelling.  Genitourinary: Positive for dysuria and frequency. Negative for flank pain and hematuria.  Psychiatric/Behavioral: Negative for depression. The patient is not nervous/anxious.     Past Medical History:  Diagnosis Date  . Cervicalgia   . Chest pain at rest 01/07/2012  . Displacement of cervical intervertebral disc without myelopathy   . Diverticulosis of colon (without mention of hemorrhage)   . Female stress incontinence   . GERD (gastroesophageal reflux disease)    no peds occasional pepcid  . Inguinal hernia without mention of obstruction or gangrene, unilateral or unspecified, (not specified as recurrent)   . Internal hemorrhoids without mention of complication   . Lumbago   . Macular degeneration (senile) of retina, unspecified   . Osteoarthrosis, unspecified whether generalized or localized, unspecified site   . Other and unspecified hyperlipidemia   . Pain in joint, hand   .  Palpitations   . Reflux esophagitis   . Scoliosis (and kyphoscoliosis), idiopathic   . Senile osteoporosis   . Skin disorder   . Thyroid disease   . TIA (transient ischemic attack)   .  Unruptured popliteal cyst 06/24/2014   Right knee   . Unspecified essential hypertension   . Unspecified glaucoma(365.9)   . Unspecified hypothyroidism   . Unspecified vitamin D deficiency    Past Surgical History:  Procedure Laterality Date  . ABDOMINAL HYSTERECTOMY  1975   Dr Mallie Mussel  . APPENDECTOMY  1988  . cardiolite myocardial perfusion study    . DOPPLER ECHOCARDIOGRAPHY    . EYE SURGERY Bilateral 2009   cataract removed right eye, Dr Charise Killian  . FOOT SURGERY  august 2013   Doran Durand, MD  . INGUINAL HERNIA REPAIR Bilateral    w/mesh  . INGUINAL HERNIA REPAIR Left 08/03/2017   Procedure: LAPAROSCOPIC LEFT INGUINAL HERNIA REPAIR WITH MESH;  Surgeon: Greer Pickerel, MD;  Location: [place];  Service: General;  Laterality: Left;  . INGUINAL HERNIA REPAIR Right 08/03/2017   Procedure: HERNIA REPAIR RIGHT INGUINAL ADULT WITH MESH;  Surgeon: Greer Pickerel, MD;  Location: [place];  Service: General;  Laterality: Right;  . INGUINAL HERNIA REPAIR     Dr. Redmond Pulling 04-24-18  . INGUINAL HERNIA REPAIR Right 04/24/2018   Procedure: DIAGNOSTIC LAPAROSCOPIC, OPENREPAIR OF RECURRENT RIGHT INGUINAL HERNIA WITH MESH ERAS PATHWAY;  Surgeon: Greer Pickerel, MD;  Location: [facility];  Service: General;  Laterality: Right;  . INSERTION OF MESH Bilateral 08/03/2017   Procedure: INSERTION OF MESH;  Surgeon: Greer Pickerel, MD;  Location: [place];  Service: General;  Laterality: Bilateral;  . NM MYOVIEW LTD     negative  . TONSILLECTOMY  1937  . TOOTH EXTRACTION  09/16/13   Dr Carlos American   Social History:   reports that she has never smoked. She has never used smokeless tobacco. She reports that she does not drink alcohol and does not use drugs.  Family History  Problem Relation Age of Onset  . CAD Mother   . Parkinson's disease Mother   . Cancer Father        colon cancer  . Parkinson's disease Father   . Stroke Maternal Grandmother     Medications: Patient's Medications  New Prescriptions   No medications on file   Previous Medications   ACETAMINOPHEN (TYLENOL) 500 MG TABLET    Take 500 mg by mouth 2 (two) times daily as needed for mild pain or headache.    BIMATOPROST (LUMIGAN) 0.01 % SOLN    Place 1 drop into both eyes at bedtime.   CALCIUM CARBONATE-VITAMIN D 600-400 MG-UNIT CHEW TABLET    Chew 1 tablet by mouth 2 (two) times daily.    CAMPHOR-MENTHOL (SARNA) LOTION    Apply 1 application topically as needed for itching. Prescribed by Dermatology at [place] (VITAMIN D3) 50 MCG (2000 UT) TABS    Take by mouth.   CLOPIDOGREL (PLAVIX) 75 MG TABLET    TAKE 1 TABLET ONCE A DAY TO PREVENT STROKE.   CRANBERRY EXTRACT PO    Take 2 tablets by mouth daily as needed.   FAMOTIDINE (PEPCID AC) 10 MG CHEWABLE TABLET    Chew 10 mg by mouth at bedtime as needed for heartburn.   HYDROCORTISONE CREAM 1 %    Apply 1 application topically daily as needed for itching.    METOPROLOL SUCCINATE (TOPROL-XL) 25 MG 24 HR TABLET  TAKE 1 TABLET ONCE DAILY TO REGULATE HEART AND CONTROL BLOOD PRESSURE.   MULTIPLE VITAMINS-MINERALS (PRESERVISION AREDS 2 PO)    Take 1 capsule by mouth 2 (two) times daily.    POLYETHYL GLYCOL-PROPYL GLYCOL 0.4-0.3 % SOLN    Place 1 vial into both eyes 2 (two) times daily. May use another dose during the day as needed for dry eye   PSYLLIUM (METAMUCIL) 58.6 % PACKET    Take 1 packet by mouth daily. Mix with water   VITAMIN C (ASCORBIC ACID) 500 MG TABLET    Take 500 mg by mouth daily with supper.   Modified Medications   No medications on file  Discontinued Medications   No medications on file    Physical Exam:  Vitals:   09/09/20 1605  BP: 119/72  Pulse: 79  Resp: 20  Temp: (!) 97.5 F (36.4 C)  TempSrc: Temporal  SpO2: 94%  Weight: 129 lb 12.8 oz (58.9 kg)  Height: 5' (1.524 m)   Body mass index is 25.35 kg/m. Wt Readings from Last 3 Encounters:  09/09/20 129 lb 12.8 oz (58.9 kg)  08/18/20 129 lb (58.5 kg)  08/08/20 130 lb (59 kg)    Physical  Exam Vitals reviewed.  Constitutional:      General: She is not in acute distress. Cardiovascular:     Rate and Rhythm: Normal rate and regular rhythm.     Pulses: Normal pulses.     Heart sounds: Normal heart sounds. No murmur heard.   Pulmonary:     Effort: Pulmonary effort is normal. No respiratory distress.     Breath sounds: Normal breath sounds. No wheezing.  Skin:    General: Skin is warm and dry.     Capillary Refill: Capillary refill takes less than 2 seconds.  Neurological:     General: No focal deficit present.     Mental Status: She is alert and oriented to person, place, and time.  Psychiatric:        Mood and Affect: Mood normal.        Behavior: Behavior normal.    Labs reviewed: Basic Metabolic Panel: Recent Labs    09/25/19 0846 06/09/20 0826 08/08/20 2007  NA 138 139 137  K 4.2 4.0 4.3  CL 103 105 102  CO2 28 28 24   GLUCOSE 77 84 106*  BUN 32* 27* 27*  CREATININE 0.78 0.70 0.70  CALCIUM 9.4 9.4 9.2   Liver Function Tests: Recent Labs    09/25/19 0846 06/09/20 0826 08/08/20 2007  AST 18 17 19   ALT 14 11 13   ALKPHOS  --   --  44  BILITOT 0.4 0.5 0.6  PROT 6.5 6.0* 6.4*  ALBUMIN  --   --  4.1   No results for input(s): LIPASE, AMYLASE in the last 8760 hours. No results for input(s): AMMONIA in the last 8760 hours. CBC: Recent Labs    06/09/20 0826 08/08/20 2007  WBC 6.6 12.7*  NEUTROABS 3,082 10.6*  HGB 12.3 12.7  HCT 36.9 38.7  MCV 91.3 92.8  PLT 207 183   Lipid Panel: No results for input(s): CHOL, HDL, LDLCALC, TRIG, CHOLHDL, LDLDIRECT in the last 8760 hours. TSH: No results for input(s): TSH in the last 8760 hours. A1C: Lab Results  Component Value Date   HGBA1C 5.3 05/02/2013     Assessment/Plan 1. Dysuria - reports some burning a few days ago - refused pyridium prescription  - POC Urinalysis Dipstick- some leukocytes otherwise unremarkable -  Culture, Urine  2. Urinary frequency - treated for UTI on month ago-  positive for E.Coli- given 5 days Keflex - encourage hydration - continue cranberry supplement - avoid caffineated drinks - recommend urology referral if issue continues  - Culture, Urine  Total time: 21 minutes. Greater than 50% of total time spent doing patient counseling and coordination of care regarding appropriate hydration and UTI prevention.    Next appt: Visit date not found [place], [facility] [phone]

## 2020-09-10 ENCOUNTER — Ambulatory Visit: Payer: Medicare Other | Admitting: Adult Health

## 2020-09-11 LAB — URINE CULTURE
MICRO NUMBER:: 11830272
SPECIMEN QUALITY:: ADEQUATE

## 2020-09-12 ENCOUNTER — Encounter: Payer: Self-pay | Admitting: Orthopedic Surgery

## 2020-09-12 ENCOUNTER — Other Ambulatory Visit: Payer: Self-pay | Admitting: Orthopedic Surgery

## 2020-09-12 DIAGNOSIS — N3000 Acute cystitis without hematuria: Secondary | ICD-10-CM

## 2020-09-12 DIAGNOSIS — N3001 Acute cystitis with hematuria: Secondary | ICD-10-CM

## 2020-09-12 MED ORDER — LEVOFLOXACIN 500 MG PO TABS: 500.0000 mg | ORAL_TABLET | Freq: Every day | ORAL | 0 refills | Status: AC

## 2020-09-12 NOTE — Progress Notes (Signed)
Prescription for levoquin sent. Patient notified.

## 2020-09-14 ENCOUNTER — Ambulatory Visit (INDEPENDENT_AMBULATORY_CARE_PROVIDER_SITE_OTHER): Payer: Medicare Other | Admitting: Family Medicine

## 2020-09-14 ENCOUNTER — Encounter: Payer: Self-pay | Admitting: Family Medicine

## 2020-09-14 ENCOUNTER — Other Ambulatory Visit: Payer: Self-pay

## 2020-09-14 VITALS — BP 122/80 | HR 71 | Temp 97.1°F | Ht 60.0 in | Wt 129.0 lb

## 2020-09-14 DIAGNOSIS — N3020 Other chronic cystitis without hematuria: Secondary | ICD-10-CM

## 2020-09-14 DIAGNOSIS — N3000 Acute cystitis without hematuria: Secondary | ICD-10-CM

## 2020-09-14 DIAGNOSIS — I10 Essential (primary) hypertension: Secondary | ICD-10-CM | POA: Diagnosis not present

## 2020-09-14 MED ORDER — CEPHALEXIN 500 MG PO CAPS
500.0000 mg | ORAL_CAPSULE | Freq: Two times a day (BID) | ORAL | 1 refills | Status: DC
Start: 2020-09-14 — End: 2020-10-19

## 2020-09-14 NOTE — Telephone Encounter (Signed)
Patient notified and scheduled an appointment with Dr. Sabra Heck for today at 1:30

## 2020-09-14 NOTE — Telephone Encounter (Signed)
Amy, NP called me and requested I call the patient with options.  1-Stay Hydrated 2-Appointment with Dr. Sabra Heck 3-Urology Consultation.   Tried calling patient, LMOM to return call.

## 2020-09-14 NOTE — Progress Notes (Signed)
Provider:  Alain Honey, MD  Careteam: Patient Care Team: Wardell Honour, MD as PCP - General (Family Medicine) Lorretta Harp, MD as PCP - Cardiology (Cardiology) Laurence Spates, MD (Inactive) as Consulting Physician (Gastroenterology) Myrlene Broker, MD as Attending Physician (Urology) Lorretta Harp, MD as Consulting Physician (Cardiology) Rozetta Nunnery, MD as Consulting Physician (Otolaryngology) Richarda Osmond, [place] (Dentistry) Shon Hough, MD as Consulting Physician (Ophthalmology) Iran Planas, MD as Consulting Physician (Orthopedic Surgery) Greer Pickerel, MD as Consulting Physician (General Surgery)  [name]:  [place]  Advanced Directive information    Allergies  Allergen Reactions  . Trimethoprim Other (See Comments)    Headaches/ ear pressure   . Erythromycin Other (See Comments)    UNSPECIFIED REACTION   . Macrodantin [Nitrofurantoin Macrocrystal] Other (See Comments)    UNSPECIFIED REACTION   . Penicillins Other (See Comments)    UNSPECIFIED REACTION  Has patient had a PCN reaction causing immediate rash, facial/tongue/throat swelling, SOB or lightheadedness with hypotension: Unknown Has patient had a PCN reaction causing severe rash involving mucus membranes or skin necrosis: Unknown Has patient had a PCN reaction that required hospitalization: No Has patient had a PCN reaction occurring within the last 10 years: No If all of the above answers are "NO", then may proceed with Cephalosporin use.  . Sulfa Antibiotics Other (See Comments)    UNSPECIFIED REACTION   . Latex Itching    No chief complaint on file.    HPI: Patient is a 87 y.o. [place] patient was seen last week by nurse practitioner.  She ordered a urinalysis and culture.  Culture reports infection with E. coli sensitive to all antibiotics tested.  She was started on Levaquin but she does not want to take Levaquin.  She had taken similar quinolone, Cipro  before and developed some tendinitis in her foot. Urinary tract infection prior to this when showed sensitivity to cephalosporins and she took that without incident, even though she is allergic to penicillins and there is a cross sensitivity. She denies fever chills nausea vomiting or back pain. Review of Systems:  Review of Systems  Genitourinary: Positive for dysuria and frequency.  All other systems reviewed and are negative.   Past Medical History:  Diagnosis Date  . Cervicalgia   . Chest pain at rest 01/07/2012  . Displacement of cervical intervertebral disc without myelopathy   . Diverticulosis of colon (without mention of hemorrhage)   . [place] stress incontinence   . GERD (gastroesophageal reflux disease)    no peds occasional pepcid  . Inguinal hernia without mention of obstruction or gangrene, unilateral or unspecified, (not specified as recurrent)   . Internal hemorrhoids without mention of complication   . Lumbago   . Macular degeneration (senile) of retina, unspecified   . Osteoarthrosis, unspecified whether generalized or localized, unspecified site   . Other and unspecified hyperlipidemia   . Pain in joint, hand   . Palpitations   . Reflux esophagitis   . Scoliosis (and kyphoscoliosis), idiopathic   . Senile osteoporosis   . Skin disorder   . Thyroid disease   . TIA (transient ischemic attack)   . Unruptured popliteal cyst 06/24/2014   Right knee   . Unspecified essential hypertension   . Unspecified glaucoma(365.9)   . Unspecified hypothyroidism   . Unspecified vitamin D deficiency    Past Surgical History:  Procedure Laterality Date  . ABDOMINAL HYSTERECTOMY  1975   Dr Mallie Mussel  . APPENDECTOMY  1988  . cardiolite myocardial perfusion study    . DOPPLER ECHOCARDIOGRAPHY    . EYE SURGERY Bilateral 2009   cataract removed right eye, Dr Charise Killian  . FOOT SURGERY  august 2013   Doran Durand, MD  . INGUINAL HERNIA REPAIR Bilateral    w/mesh  . INGUINAL HERNIA REPAIR  Left 08/03/2017   Procedure: LAPAROSCOPIC LEFT INGUINAL HERNIA REPAIR WITH MESH;  Surgeon: Greer Pickerel, MD;  Location: [place];  Service: General;  Laterality: Left;  . INGUINAL HERNIA REPAIR Right 08/03/2017   Procedure: HERNIA REPAIR RIGHT INGUINAL ADULT WITH MESH;  Surgeon: Greer Pickerel, MD;  Location: [place];  Service: General;  Laterality: Right;  . INGUINAL HERNIA REPAIR     Dr. Redmond Pulling 12-11-[place]  . INGUINAL HERNIA REPAIR Right 04/24/2018   Procedure: DIAGNOSTIC LAPAROSCOPIC, OPENREPAIR OF RECURRENT RIGHT INGUINAL HERNIA WITH MESH ERAS PATHWAY;  Surgeon: Greer Pickerel, MD;  Location: [facility];  Service: General;  Laterality: Right;  . INSERTION OF MESH Bilateral 08/03/2017   Procedure: INSERTION OF MESH;  Surgeon: Greer Pickerel, MD;  Location: [place];  Service: General;  Laterality: Bilateral;  . NM MYOVIEW LTD     negative  . TONSILLECTOMY  1937  . TOOTH EXTRACTION  09/16/13   Dr Carlos American   Social History:   reports that she has never smoked. She has never used smokeless tobacco. She reports that she does not drink alcohol and does not use drugs.  Family History  Problem Relation Age of Onset  . CAD Mother   . Parkinson's disease Mother   . Cancer Father        colon cancer  . Parkinson's disease Father   . Stroke Maternal Grandmother     Medications: Patient's Medications  New Prescriptions   No medications on file  Previous Medications   ACETAMINOPHEN (TYLENOL) 500 MG TABLET    Take 500 mg by mouth 2 (two) times daily as needed for mild pain or headache.    BIMATOPROST (LUMIGAN) 0.01 % SOLN    Place 1 drop into both eyes at bedtime.   CALCIUM CARBONATE-VITAMIN D 600-400 MG-UNIT CHEW TABLET    Chew 1 tablet by mouth 2 (two) times daily.    CAMPHOR-MENTHOL (SARNA) LOTION    Apply 1 application topically as needed for itching. Prescribed by Dermatology at [place] (VITAMIN D3) 50 MCG (2000 UT) TABS    Take by mouth.   CLOPIDOGREL (PLAVIX) 75 MG TABLET    TAKE 1  TABLET ONCE A DAY TO PREVENT STROKE.   CRANBERRY EXTRACT PO    Take 2 tablets by mouth daily as needed.   FAMOTIDINE (PEPCID AC) 10 MG CHEWABLE TABLET    Chew 10 mg by mouth at bedtime as needed for heartburn.   HYDROCORTISONE CREAM 1 %    Apply 1 application topically daily as needed for itching.    LEVOFLOXACIN (LEVAQUIN) 500 MG TABLET    Take 1 tablet (500 mg total) by mouth daily for 5 days.   METOPROLOL SUCCINATE (TOPROL-XL) 25 MG 24 HR TABLET    TAKE 1 TABLET ONCE DAILY TO REGULATE HEART AND CONTROL BLOOD PRESSURE.   MULTIPLE VITAMINS-MINERALS (PRESERVISION AREDS 2 PO)    Take 1 capsule by mouth 2 (two) times daily.    POLYETHYL GLYCOL-PROPYL GLYCOL 0.4-0.3 % SOLN    Place 1 vial into both eyes 2 (two) times daily. May use another dose during the day as needed for dry eye   PSYLLIUM (METAMUCIL) 58.6 %  PACKET    Take 1 packet by mouth daily. Mix with water   VITAMIN C (ASCORBIC ACID) 500 MG TABLET    Take 500 mg by mouth daily with supper.   Modified Medications   No medications on file  Discontinued Medications   No medications on file    Physical Exam:  Vitals:   09/14/20 1335  Pulse: 71  Temp: (!) 97.1 F (36.2 C)  TempSrc: Temporal  SpO2: 96%   There is no height or weight on file to calculate BMI. Wt Readings from Last 3 Encounters:  09/09/20 129 lb 12.8 oz (58.9 kg)  08/18/20 129 lb (58.5 kg)  08/08/20 130 lb (59 kg)    Physical Exam Vitals and nursing note reviewed.  Constitutional:      Appearance: Normal appearance.  HENT:     Head: Normocephalic.  Cardiovascular:     Rate and Rhythm: Normal rate.  Pulmonary:     Effort: Pulmonary effort is normal.  Neurological:     General: No focal deficit present.     Mental Status: She is alert and oriented to person, place, and time.  Psychiatric:        Mood and Affect: Mood normal.        Behavior: Behavior normal.        Thought Content: Thought content normal.     Labs reviewed: Basic Metabolic  Panel: Recent Labs    09/25/19 0846 06/09/20 0826 08/08/20 2007  NA 138 139 137  K 4.2 4.0 4.3  CL 103 105 102  CO2 28 28 24   GLUCOSE 77 84 106*  BUN 32* 27* 27*  CREATININE 0.78 0.70 0.70  CALCIUM 9.4 9.4 9.2   Liver Function Tests: Recent Labs    09/25/19 0846 06/09/20 0826 08/08/20 2007  AST 18 17 [place]   ALT 14 11 13   ALKPHOS  --   --  44  BILITOT 0.4 0.5 0.6  PROT 6.5 6.0* 6.4*  ALBUMIN  --   --  4.1   No results for input(s): LIPASE, AMYLASE in the last 8760 hours. No results for input(s): AMMONIA in the last 8760 hours. CBC: Recent Labs    06/09/20 0826 08/08/20 2007  WBC 6.6 12.7*  NEUTROABS 3,082 10.6*  HGB 12.3 12.7  HCT 36.9 38.7  MCV 91.3 92.8  PLT 207 183   Lipid Panel: No results for input(s): CHOL, HDL, LDLCALC, TRIG, CHOLHDL, LDLDIRECT in the last 8760 hours. TSH: No results for input(s): TSH in the last 8760 hours. A1C: Lab Results  Component Value Date   HGBA1C 5.3 12/[place]/2014     Assessment/Plan  1. Acute cystitis without hematuria We will discontinue the Levaquin per patient request, and I am inclined to agree that side effect profile for fluorinated quinolones especially in the elderly is probably better not used. - cephALEXin (KEFLEX) 500 MG capsule; Take 1 capsule (500 mg total) by mouth 2 (two) times daily.  Dispense: 14 capsule; Refill: 1  2. Essential hypertension Blood pressure is good today at 122/80    3. Chronic cystitis Encouraged increased water intake.  She does also wear a pad for urinary incontinence and it is an adequate fluid intake and incontinence are probably the 2 biggest reasons why she develops this problem chronically.   Alain Honey, MD [facility] [phone]

## 2020-09-14 NOTE — Patient Instructions (Signed)
Be sure to drink plenty of water Call back if some reaction develops with new medicine

## 2020-09-15 ENCOUNTER — Telehealth: Payer: Self-pay | Admitting: *Deleted

## 2020-09-15 NOTE — Telephone Encounter (Signed)
Patient daughter called and stated since taking the new antibiotic, Keflex, she has been vomiting and stomach hurts. Has taken 2 doses with food. No other symptom noted.   Instructed daughter to have patient stop the antibiotic and increase fluids until we hear from the Provider.   Please Advise. (forwarded to Dr. Sabra Heck and Janett Billow, due to Dr. Sabra Heck out of office)

## 2020-09-16 NOTE — Telephone Encounter (Signed)
She may have ran into a GI bug as that is going around. Would recommend bland diet and advance as tolerates.  She can try keflex again once she has not vomiting for 24 hours or we can go ahead and start the Rocephin injection  [facility]. I am adding Amy in on this msg incase she decides to go with the injection

## 2020-09-16 NOTE — Telephone Encounter (Signed)
If she is not able to take keflex by mouth we could see about setting up daily injection with the [facility] Nurse.  When reviewing it looks like keflex was prescribed in the emergency department- did she complete that treatment and tolerate it well at that time?

## 2020-09-16 NOTE — Telephone Encounter (Signed)
Patient notified and agreed. Stated she will try the antibiotic again after 24 hours of not vomiting.

## 2020-09-16 NOTE — Telephone Encounter (Signed)
Patient stated that she took the Keflex fine when she received it back in march with no problem.  Has not taken any Keflex since Tuesday. Still feeling Nauseated and vomited 1 time this morning. No diarrhea today but has had some Tuesday. Not eating much. No other symptoms noted.   Please Advise.

## 2020-09-22 DIAGNOSIS — H04123 Dry eye syndrome of bilateral lacrimal glands: Secondary | ICD-10-CM | POA: Diagnosis not present

## 2020-09-22 DIAGNOSIS — H401131 Primary open-angle glaucoma, bilateral, mild stage: Secondary | ICD-10-CM | POA: Diagnosis not present

## 2020-09-22 DIAGNOSIS — Z961 Presence of intraocular lens: Secondary | ICD-10-CM | POA: Diagnosis not present

## 2020-09-27 DIAGNOSIS — Z23 Encounter for immunization: Secondary | ICD-10-CM | POA: Diagnosis not present

## 2020-09-28 ENCOUNTER — Telehealth: Payer: Self-pay

## 2020-09-28 DIAGNOSIS — N390 Urinary tract infection, site not specified: Secondary | ICD-10-CM

## 2020-09-28 NOTE — Telephone Encounter (Signed)
Message left on clinical intake voicemail:   Patient had two recent UTI's and completed the last round of antibiotics on Saturday.  I returned call to patient to clarify her call. Patient had her b/p checked by one of the facility nurses and she told her it might be a good idea to have [place] collect another urine sample to confirm infection cleared up.  I asked patient if she was having any symptoms of a UTI and she responded, no. Patient aware I will send message to Dr.Miller to get his expert advise

## 2020-09-28 NOTE — Telephone Encounter (Signed)
Given her history, probably a good idea to repeat urine

## 2020-09-29 ENCOUNTER — Other Ambulatory Visit: Payer: Medicare Other

## 2020-09-29 ENCOUNTER — Other Ambulatory Visit: Payer: Self-pay

## 2020-09-29 DIAGNOSIS — N390 Urinary tract infection, site not specified: Secondary | ICD-10-CM | POA: Diagnosis not present

## 2020-09-29 DIAGNOSIS — H353211 Exudative age-related macular degeneration, right eye, with active choroidal neovascularization: Secondary | ICD-10-CM | POA: Diagnosis not present

## 2020-09-29 NOTE — Telephone Encounter (Signed)
Pt left mess on referral line stating that she missed a call back bc she was talking with a friend & didn't realize how long her convo was.  Please feel free to call back with updated info needed  Thanks, Lattie Haw

## 2020-09-29 NOTE — Telephone Encounter (Signed)
Left message on voicemail for patient to return call to schedule a lab appointment to recheck urine. Future orders placed for culture and microscopic

## 2020-09-29 NOTE — Telephone Encounter (Signed)
Patient called and scheduled an appointment for today

## 2020-10-01 ENCOUNTER — Telehealth: Payer: Self-pay | Admitting: *Deleted

## 2020-10-01 LAB — URINALYSIS, ROUTINE W REFLEX MICROSCOPIC
Bilirubin Urine: NEGATIVE
Glucose, UA: NEGATIVE
Hgb urine dipstick: NEGATIVE
Ketones, ur: NEGATIVE
Leukocytes,Ua: NEGATIVE
Nitrite: NEGATIVE
Protein, ur: NEGATIVE
Specific Gravity, Urine: 1.017 (ref 1.001–1.035)
pH: 6 (ref 5.0–8.0)

## 2020-10-01 LAB — URINE CULTURE
MICRO NUMBER:: 11909248
SPECIMEN QUALITY:: ADEQUATE

## 2020-10-01 NOTE — Telephone Encounter (Signed)
UA negative, culture positive for some bacteria. This is unchanged from the past month. She has been treated twice with Keflex. Recommend contacting Dr. Sabra Heck next week to discuss future steps. Continue to promote daily hydration with water.

## 2020-10-01 NOTE — Telephone Encounter (Signed)
Patient called regarding the Urine Results she had done early this week. Stated that she saw the results in [place] but not sure what they mean.   Please Advise. (Forwarded to Amy due to Dr. Sabra Heck out of office)

## 2020-10-04 NOTE — Telephone Encounter (Signed)
LMOM to return call.

## 2020-10-05 ENCOUNTER — Telehealth: Payer: Self-pay | Admitting: Family Medicine

## 2020-10-05 NOTE — Telephone Encounter (Signed)
Patient notified and agreed. Stated that she is drinking water. Patient stated that she is not showing any signs of symptoms of a UTI at this moment. Stated that she spoke with Dr. Sabra Heck yesterday at the facility.   Forwarded to Dr. Sabra Heck for recommendation.

## 2020-10-05 NOTE — Telephone Encounter (Signed)
Message has already been forwarded to Dr. Sabra Heck.   Disregard this message.

## 2020-10-05 NOTE — Telephone Encounter (Signed)
Pt called to remind Dr Sabra Heck to check on her test results from Friday 10/01/20  [name] spoke with Dr Sabra Heck over the weekend. Thanks, [name]

## 2020-10-06 ENCOUNTER — Telehealth: Payer: Self-pay

## 2020-10-06 DIAGNOSIS — G459 Transient cerebral ischemic attack, unspecified: Secondary | ICD-10-CM

## 2020-10-06 MED ORDER — CLOPIDOGREL BISULFATE 75 MG PO TABS: ORAL_TABLET | ORAL | 1 refills | Status: DC

## 2020-10-06 NOTE — Telephone Encounter (Signed)
Patient notified and agreed.  

## 2020-10-06 NOTE — Telephone Encounter (Signed)
Just continue hydrating.  Will reassess whenever she has symptoms.

## 2020-10-06 NOTE — Telephone Encounter (Signed)
Patient request refill

## 2020-10-17 ENCOUNTER — Telehealth: Payer: Medicare Other | Admitting: Family

## 2020-10-17 ENCOUNTER — Encounter: Payer: Self-pay | Admitting: Family Medicine

## 2020-10-17 ENCOUNTER — Encounter: Payer: Self-pay | Admitting: Family

## 2020-10-17 DIAGNOSIS — R399 Unspecified symptoms and signs involving the genitourinary system: Secondary | ICD-10-CM | POA: Diagnosis not present

## 2020-10-17 MED ORDER — DOXYCYCLINE HYCLATE 100 MG PO TABS: 100.0000 mg | ORAL_TABLET | Freq: Two times a day (BID) | ORAL | 0 refills | Status: DC

## 2020-10-17 NOTE — Progress Notes (Signed)
   Virtual Visit  Note Due to COVID-19 pandemic this visit was conducted virtually. This visit type was conducted due to national recommendations for restrictions regarding the COVID-19 Pandemic (e.g. social distancing, sheltering in place) in an effort to limit this patient's exposure and mitigate transmission in our community. All issues noted in this document were discussed and addressed.  A physical exam was not performed with this format.  PT connected twice  But unable to see or hear her. She disconnected and unable to reconnect.   Evelina Dun, FNP

## 2020-10-17 NOTE — Progress Notes (Signed)
   Virtual Visit  Note Due to COVID-19 pandemic this visit was conducted virtually. This visit type was conducted due to national recommendations for restrictions regarding the COVID-19 Pandemic (e.g. social distancing, sheltering in place) in an effort to limit this patient's exposure and mitigate transmission in our community. All issues noted in this document were discussed and addressed.  A physical exam was not performed with this format.  I connected with [name] on 10/17/20 at 6:54 pm  by telephone and verified that I am speaking with the correct person using two identifiers. [name] is currently located at home and no one is currently with her during visit. The provider, Evelina Dun, FNP is located in their office at time of visit.  I discussed the limitations, risks, security and privacy concerns of performing an evaluation and management service by telephone and the availability of in person appointments. I also discussed with the patient that there may be a patient responsible charge related to this service. The patient expressed understanding and agreed to proceed.   History and Present Illness:  Dysuria  This is a recurrent problem. The current episode started yesterday. The problem occurs intermittently. The problem has been gradually worsening. The quality of the pain is described as burning. The pain is mild. There has been no fever. Associated symptoms include frequency, hesitancy and urgency. Pertinent negatives include no flank pain, hematuria or nausea. She has tried increased fluids for the symptoms. The treatment provided mild relief.      Review of Systems  Gastrointestinal: Negative for nausea.  Genitourinary: Positive for dysuria, frequency, hesitancy and urgency. Negative for flank pain and hematuria.     Observations/Objective: No SOB or distress noted, A&O x4  Assessment and Plan: 1. UTI symptoms Force fluids AZO over the counter X2  days PT to call PCP tomorrow and get appt, will need urine culture - doxycycline (VIBRA-TABS) 100 MG tablet; Take 1 tablet (100 mg total) by mouth 2 (two) times daily.  Dispense: 20 tablet; Refill: 0     I discussed the assessment and treatment plan with the patient. The patient was provided an opportunity to ask questions and all were answered. The patient agreed with the plan and demonstrated an understanding of the instructions.   The patient was advised to call back or seek an in-person evaluation if the symptoms worsen or if the condition fails to improve as anticipated.  The above assessment and management plan was discussed with the patient. The patient verbalized understanding of and has agreed to the management plan. Patient is aware to call the clinic if symptoms persist or worsen. Patient is aware when to return to the clinic for a follow-up visit. Patient educated on when it is appropriate to go to the emergency department.   Time call ended:  7:04 pm  I provided 10 minutes of   face-to-face time during this encounter.    Evelina Dun, FNP

## 2020-10-18 ENCOUNTER — Telehealth: Payer: Self-pay | Admitting: Family Medicine

## 2020-10-18 NOTE — Telephone Encounter (Signed)
Tried calling patient. Voicemail is full and cannot leave message.

## 2020-10-18 NOTE — Telephone Encounter (Signed)
Did you take care of this? 

## 2020-10-18 NOTE — Telephone Encounter (Signed)
Pt wants to bring a urine in for culture so that when she comes in to see Dr Sabra Heck on Wed 6/8, he will have results.  Please advise  [name]

## 2020-10-18 NOTE — Telephone Encounter (Signed)
Appointment scheduled with Bard Herbert, NP for tomorrow 10/19/2020

## 2020-10-18 NOTE — Telephone Encounter (Signed)
Forwarded message to Jessica due to Dr. Miller out of office.  

## 2020-10-18 NOTE — Telephone Encounter (Signed)
Patient notified that urine will be collected at time of appointment if needed. No current symptoms.   Patient agreed.

## 2020-10-19 ENCOUNTER — Ambulatory Visit (INDEPENDENT_AMBULATORY_CARE_PROVIDER_SITE_OTHER): Payer: Medicare Other | Admitting: Adult Health

## 2020-10-19 ENCOUNTER — Other Ambulatory Visit: Payer: Self-pay

## 2020-10-19 ENCOUNTER — Encounter: Payer: Self-pay | Admitting: Adult Health

## 2020-10-19 VITALS — BP 130/78 | HR 63 | Temp 96.6°F | Ht 60.0 in | Wt 128.0 lb

## 2020-10-19 DIAGNOSIS — R300 Dysuria: Secondary | ICD-10-CM

## 2020-10-19 LAB — POCT URINALYSIS DIPSTICK
Bilirubin, UA: NEGATIVE
Blood, UA: NEGATIVE
Glucose, UA: NEGATIVE
Ketones, UA: NEGATIVE
Leukocytes, UA: NEGATIVE
Nitrite, UA: NEGATIVE
Odor: NORMAL
Protein, UA: NEGATIVE
Spec Grav, UA: <=1.005 — AB (ref 1.010–1.025)
Urobilinogen, UA: 0.2 E.U./dL
pH, UA: 6.5 (ref 5.0–8.0)

## 2020-10-19 NOTE — Progress Notes (Signed)
Location:  [name]   [name]:   clinic    CODE STATUS: dnr   Allergies  Allergen Reactions  . Trimethoprim Other (See Comments)    Headaches/ ear pressure   . Erythromycin Other (See Comments)    UNSPECIFIED REACTION   . Macrodantin [Nitrofurantoin Macrocrystal] Other (See Comments)    UNSPECIFIED REACTION   . Penicillins Other (See Comments)    UNSPECIFIED REACTION  Has patient had a PCN reaction causing immediate rash, facial/tongue/throat swelling, SOB or lightheadedness with hypotension: Unknown Has patient had a PCN reaction causing severe rash involving mucus membranes or skin necrosis: Unknown Has patient had a PCN reaction that required hospitalization: No Has patient had a PCN reaction occurring within the last 10 years: No If all of the above answers are "NO", then may proceed with Cephalosporin use.  . Sulfa Antibiotics Other (See Comments)    UNSPECIFIED REACTION   . Latex Itching    Chief Complaint  Patient presents with  . Acute Visit    Possible UTI, patient c/o urinary urgency. Other symptoms from video visit on 10/17/2020 resolved     HPI:  She is a 87 year old woman who comes into the clinic with concerns of UTI. Over this past weekend she developed dysuria; she pushed fluids but had little relief. She had a video visit done and was given a prescription for doxycycline. She has not started this medication as she wanted a test to determine if she had an infection or not. We have discussed some other causes of dysuria. She has verbalized understanding. We have discussed signs/symptoms or UTI to include dysuria; frequency; hesitancy; increased incontinence; nausea vomiting lower abdominal pain and back pain. She is taking azo and drinking cranberry juice daily. Her UA has come back normal.    Past Medical History:  Diagnosis Date  . Cervicalgia   . Chest pain at rest 01/07/2012  . Displacement of cervical intervertebral disc without myelopathy   .  Diverticulosis of colon (without mention of hemorrhage)   . Female stress incontinence   . GERD (gastroesophageal reflux disease)    no peds occasional pepcid  . Inguinal hernia without mention of obstruction or gangrene, unilateral or unspecified, (not specified as recurrent)   . Internal hemorrhoids without mention of complication   . Lumbago   . Macular degeneration (senile) of retina, unspecified   . Osteoarthrosis, unspecified whether generalized or localized, unspecified site   . Other and unspecified hyperlipidemia   . Pain in joint, hand   . Palpitations   . Reflux esophagitis   . Scoliosis (and kyphoscoliosis), idiopathic   . Senile osteoporosis   . Skin disorder   . Thyroid disease   . TIA (transient ischemic attack)   . Unruptured popliteal cyst 06/24/2014   Right knee   . Unspecified essential hypertension   . Unspecified glaucoma(365.9)   . Unspecified hypothyroidism   . Unspecified vitamin D deficiency     Past Surgical History:  Procedure Laterality Date  . ABDOMINAL HYSTERECTOMY  1975   Dr Mallie Mussel  . APPENDECTOMY  1988  . cardiolite myocardial perfusion study    . DOPPLER ECHOCARDIOGRAPHY    . EYE SURGERY Bilateral 2009   cataract removed right eye, Dr Charise Killian  . FOOT SURGERY  august 2013   Doran Durand, MD  . INGUINAL HERNIA REPAIR Bilateral    w/mesh  . INGUINAL HERNIA REPAIR Left 08/03/2017   Procedure: LAPAROSCOPIC LEFT INGUINAL HERNIA REPAIR WITH MESH;  Surgeon: Redmond Pulling,  [name], MD;  Location: [facility];  Service: General;  Laterality: Left;  . INGUINAL HERNIA REPAIR Right 08/03/2017   Procedure: HERNIA REPAIR RIGHT INGUINAL ADULT WITH MESH;  Surgeon: Greer Pickerel, MD;  Location: [facility];  Service: General;  Laterality: Right;  . INGUINAL HERNIA REPAIR     Dr. Redmond Pulling 04-24-18  . INGUINAL HERNIA REPAIR Right 04/24/2018   Procedure: DIAGNOSTIC LAPAROSCOPIC, OPENREPAIR OF RECURRENT RIGHT INGUINAL HERNIA WITH MESH ERAS PATHWAY;  Surgeon: Greer Pickerel, MD;  Location: [facility];   Service: General;  Laterality: Right;  . INSERTION OF MESH Bilateral 08/03/2017   Procedure: INSERTION OF MESH;  Surgeon: Greer Pickerel, MD;  Location: [facility];  Service: General;  Laterality: Bilateral;  . NM MYOVIEW LTD     negative  . TONSILLECTOMY  1937  . TOOTH EXTRACTION  09/16/13   Dr Carlos American    Social History   Socioeconomic History  . Marital status: Widowed    Spouse name: Not on file  . Number of children: 2  . Years of education: college  . Highest education level: Not on file  Occupational History    Comment: retired  Tobacco Use  . Smoking status: Never Smoker  . Smokeless tobacco: Never Used  Vaping Use  . Vaping Use: Never used  Substance and Sexual Activity  . Alcohol use: No  . Drug use: No  . Sexual activity: Not Currently  Other Topics Concern  . Not on file  Social History Narrative   Patient lives at home with her daughter [name]) , moved to [facility] 02/02/16 [place]    Widowed.   Retired.   EducationNurse, mental health.   Right handed.   Caffeine- None some times tea very rare.   Never smoked   Alcohol none   Social Determinants of Health   Financial Resource Strain: Not on file  Food Insecurity: Not on file  Transportation Needs: Not on file  Physical Activity: Not on file  Stress: Not on file  Social Connections: Not on file  Intimate Partner Violence: Not on file   Family History  Problem Relation Age of Onset  . CAD Mother   . Parkinson's disease Mother   . Cancer Father        colon cancer  . Parkinson's disease Father   . Stroke Maternal Grandmother       VITAL SIGNS BP 130/78 (BP Location: Right Arm, Patient Position: Sitting, Cuff Size: Normal)   Pulse 63   Temp (!) 96.6 F (35.9 C) (Temporal)   Ht 5' (1.524 m)   Wt 128 lb (58.1 kg)   SpO2 99%   BMI 25.00 kg/m   Outpatient Encounter Medications as of 10/19/2020  Medication Sig  . acetaminophen (TYLENOL) 500 MG tablet Take 500 mg by mouth 2 (two) times daily as needed  for mild pain or headache.   . bimatoprost (LUMIGAN) 0.01 % SOLN Place 1 drop into both eyes at bedtime.  . Calcium Carbonate-Vitamin D 600-400 MG-UNIT chew tablet Chew 1 tablet by mouth 2 (two) times daily.   . camphor-menthol (SARNA) lotion Apply 1 application topically as needed for itching. Prescribed by Dermatology at [facility]  . Cholecalciferol (VITAMIN D3) 50 MCG (2000 UT) TABS Take by mouth.  . clopidogrel (PLAVIX) 75 MG tablet 1 tablet daily  . CRANBERRY EXTRACT PO Take 2 tablets by mouth daily as needed.  . famotidine (PEPCID AC) 10 MG chewable tablet Chew 10 mg by mouth at bedtime as needed for heartburn.  Marland Kitchen  hydrocortisone cream 1 % Apply 1 application topically daily as needed for itching.   . metoprolol succinate (TOPROL-XL) 25 MG 24 hr tablet TAKE 1 TABLET ONCE DAILY TO REGULATE HEART AND CONTROL BLOOD PRESSURE.  . Multiple Vitamins-Minerals (PRESERVISION AREDS 2 PO) Take 1 capsule by mouth 2 (two) times daily.   Vladimir Faster Glycol-Propyl Glycol 0.4-0.3 % SOLN Place 1 vial into both eyes 2 (two) times daily. May use another dose during the day as needed for dry eye  . psyllium (METAMUCIL) 58.6 % packet Take 1 packet by mouth daily. Mix with water  . vitamin C (ASCORBIC ACID) 500 MG tablet Take 500 mg by mouth daily with supper.   . doxycycline (VIBRA-TABS) 100 MG tablet Take 1 tablet (100 mg total) by mouth 2 (two) times daily. (Patient not taking: Reported on 10/19/2020)  . [DISCONTINUED] cephALEXin (KEFLEX) 500 MG capsule Take 1 capsule (500 mg total) by mouth 2 (two) times daily.   No facility-administered encounter medications on file as of 10/19/2020.     SIGNIFICANT DIAGNOSTIC EXAMS  Reviewed   Review of Systems  Constitutional: Negative for malaise/fatigue.  Respiratory: Negative for cough and shortness of breath.   Cardiovascular: Negative for chest pain, palpitations and leg swelling.  Gastrointestinal: Negative for abdominal pain, constipation and heartburn.   Musculoskeletal: Negative for back pain, joint pain and myalgias.  Skin: Negative.   Neurological: Negative for dizziness.  Psychiatric/Behavioral: The patient is not nervous/anxious.     Physical Exam Constitutional:      General: She is not in acute distress.    Appearance: She is well-developed. She is not diaphoretic.  Neck:     Thyroid: No thyromegaly.  Cardiovascular:     Rate and Rhythm: Normal rate and regular rhythm.     Heart sounds: Normal heart sounds.  Pulmonary:     Effort: Pulmonary effort is normal. No respiratory distress.     Breath sounds: Normal breath sounds.  Abdominal:     General: Bowel sounds are normal. There is no distension.     Palpations: Abdomen is soft.     Tenderness: There is no abdominal tenderness.  Musculoskeletal:        General: Normal range of motion.  Lymphadenopathy:     Cervical: No cervical adenopathy.  Skin:    General: Skin is warm and dry.  Neurological:     Mental Status: She is alert and oriented to person, place, and time.        ASSESSMENT/ PLAN:  TODAY  1. Dysuria: is improved; will continue to drink adequate water daily. Will not take the antibiotic therapy.    Ok Edwards NP [facility]  Contact [phone] Monday through Friday 8am- 5pm  After hours call [phone]

## 2020-10-20 ENCOUNTER — Ambulatory Visit: Payer: Medicare Other | Admitting: Family Medicine

## 2020-10-28 ENCOUNTER — Other Ambulatory Visit: Payer: Self-pay

## 2020-10-28 ENCOUNTER — Encounter: Payer: Medicare Other | Admitting: Nurse Practitioner

## 2020-10-28 ENCOUNTER — Ambulatory Visit: Payer: Self-pay | Admitting: Nurse Practitioner

## 2020-10-28 ENCOUNTER — Telehealth: Payer: Self-pay

## 2020-10-28 NOTE — Telephone Encounter (Signed)
Attempted to contact patient to conduct annual wellness visit,using the contact information in the appointment note,but received voicemail on each attempt,which was full and unable to leave message.I even contacted patient's daughter [name] to make sure I had the correct contact information and to let her know that I was trying to conduct annual wellness visit. Daughter stated that she would call patient and inform her that I was trying to call her and to be sure to answer the phone. After speaking with the daughter I tried calling patient about 10 minutes later,but still no answer..   1st attempt-1:04 pm  2nd attempt-1:24 pm called both patient phone and made contact with daughter.  3rd and final attempt-1:46 pm.

## 2020-11-03 DIAGNOSIS — H353211 Exudative age-related macular degeneration, right eye, with active choroidal neovascularization: Secondary | ICD-10-CM | POA: Diagnosis not present

## 2020-11-12 ENCOUNTER — Other Ambulatory Visit: Payer: Medicare Other

## 2020-11-12 ENCOUNTER — Other Ambulatory Visit: Payer: Self-pay

## 2020-11-12 ENCOUNTER — Encounter: Payer: Self-pay | Admitting: Family

## 2020-11-12 ENCOUNTER — Telehealth (INDEPENDENT_AMBULATORY_CARE_PROVIDER_SITE_OTHER): Payer: Medicare Other | Admitting: Family

## 2020-11-12 ENCOUNTER — Telehealth: Payer: Self-pay

## 2020-11-12 DIAGNOSIS — R238 Other skin changes: Secondary | ICD-10-CM

## 2020-11-12 DIAGNOSIS — R233 Spontaneous ecchymoses: Secondary | ICD-10-CM

## 2020-11-12 LAB — CBC WITH DIFFERENTIAL/PLATELET
Absolute Monocytes: 649 cells/uL (ref 200–950)
Basophils Absolute: 83 cells/uL (ref 0–200)
Basophils Relative: 1.2 %
Eosinophils Absolute: 262 cells/uL (ref 15–500)
Eosinophils Relative: 3.8 %
HCT: 36 % (ref 35.0–45.0)
Hemoglobin: 11.7 g/dL (ref 11.7–15.5)
Lymphs Abs: 2084 cells/uL (ref 850–3900)
MCH: 30.2 pg (ref 27.0–33.0)
MCHC: 32.5 g/dL (ref 32.0–36.0)
MCV: 92.8 fL (ref 80.0–100.0)
MPV: 10.8 fL (ref 7.5–12.5)
Monocytes Relative: 9.4 %
Neutro Abs: 3823 cells/uL (ref 1500–7800)
Neutrophils Relative %: 55.4 %
Platelets: 257 10*3/uL (ref 140–400)
RBC: 3.88 10*6/uL (ref 3.80–5.10)
RDW: 14 % (ref 11.0–15.0)
Total Lymphocyte: 30.2 %
WBC: 6.9 10*3/uL (ref 3.8–10.8)

## 2020-11-12 NOTE — Telephone Encounter (Signed)
Ms. ethelle, ola are scheduled for a virtual visit with your provider today.    Just as we do with appointments in the office, we must obtain your consent to participate.  Your consent will be active for this visit and any virtual visit you may have with one of our providers in the next 365 days.    If you have a MyChart account, I can also send a copy of this consent to you electronically.  All virtual visits are billed to your insurance company just like a traditional visit in the office.  As this is a virtual visit, video technology does not allow for your provider to perform a traditional examination.  This may limit your provider's ability to fully assess your condition.  If your provider identifies any concerns that need to be evaluated in person or the need to arrange testing such as labs, EKG, etc, we will make arrangements to do so.    Although advances in technology are sophisticated, we cannot ensure that it will always work on either your end or our end.  If the connection with a video visit is poor, we may have to switch to a telephone visit.  With either a video or telephone visit, we are not always able to ensure that we have a secure connection.   I need to obtain your verbal consent now.   Are you willing to proceed with your visit today?   Dorrene German AARIYANA MANZ has provided verbal consent on 11/12/2020 for a virtual visit (video or telephone).   [place], [place] 11/12/2020  9:06 AM

## 2020-11-12 NOTE — Progress Notes (Signed)
This service is provided via telemedicine  No vital signs collected/recorded due to the encounter was a telemedicine visit.   Location of patient (ex: home, work): Home  Patient consents to a telephone visit: Yes  Location of the provider (ex: office, home): [employer].   Name of any referring provider: Wardell Honour, MD   Names of all persons participating in the telemedicine service and their role in the encounter: Patient, [name] daughter, Heriberto [place], [place], [place], [name], NP.    Time spent on call: 8 minutes spent on the phone with Medical Assistant.     Provider: Reida Hem FNP-C  Meredith Honour, MD  Patient Care Team: Meredith Honour, MD as PCP - General (Family Medicine) Meredith Harp, MD as PCP - Cardiology (Cardiology) Meredith Spates, MD (Inactive) as Consulting Physician (Gastroenterology) Meredith Broker, MD as Attending Physician (Urology) Meredith Harp, MD as Consulting Physician (Cardiology) Meredith Nunnery, MD as Consulting Physician (Otolaryngology) [name], Rainbow (Dentistry) Meredith Hough, MD as Consulting Physician (Ophthalmology) Meredith Planas, MD as Consulting Physician (Orthopedic Surgery) Meredith Pickerel, MD as Consulting Physician (General Surgery)  Extended Emergency Contact Information Primary Emergency Contact: [name] Address: [address]          [place], [place] [address] [name] of [place] Phone: [phone] Work Phone: [phone] Mobile Phone: [phone] Relation: Daughter Secondary Emergency Contact: [name] Address: [address] [place] [address] [name] of [place] Phone: [phone] Mobile Phone: [phone] Relation: Son  Code Status:  DNR Goals of care: Advanced Directive information Advanced Directives 11/12/2020  Does Patient Have a Medical Advance Directive? Yes  Type of Corporate treasure[employer] of [place];Living will;Out of facility DNR (pink MOST or yellow form)  Does patient want to make changes to medical advance directive? No - Patient declined  Copy of [place] in Chart? Yes - validated most recent copy scanned in chart (See row information)  Would patient like information on creating a medical advance directive? -  Pre-existing out of facility DNR order (yellow form or pink MOST form) -     Chief Complaint  Patient presents with   Acute Visit    Complains of spot on face.    HPI:  Pt is a 87 y.o. female seen today for an acute visit for evaluation of spot on the face x 6 days.she is seen on video call and had  her daughter [name] on the phone.States spot started below left eyelid/nasal bridge area on Sunday night and worsen over a 24 hrs.Has not worsen since then.spot is not itchy,tender or painful.she denies any vision changes or pain in the eye.  Has had previous spots like this on forearms but never on the face.also has small purple area on her left forearm.Might have hit arm on something. Does not recall any injury to face.No changes in lotion,soap ,or detergent.does not recall any insect bite.Has had no fever,chills or any other bleeding    MyChart picture taken by her daughter prior to visit reviewed. Currently on Plavix     Past Medical History:  Diagnosis Date   Cervicalgia    Chest pain at rest 01/07/2012   Displacement of cervical intervertebral disc without myelopathy    Diverticulosis of colon (without mention of hemorrhage)    Female stress incontinence    GERD (gastroesophageal reflux disease)    no peds occasional pepcid  Inguinal hernia without mention of obstruction or gangrene, unilateral or unspecified, (not specified as recurrent)    Internal hemorrhoids without mention of complication    Lumbago    Macular degeneration (senile) of retina, unspecified    Osteoarthrosis, unspecified whether generalized or  localized, unspecified site    Other and unspecified hyperlipidemia    Pain in joint, hand    Palpitations    Reflux esophagitis    Scoliosis (and kyphoscoliosis), idiopathic    Senile osteoporosis    Skin disorder    Thyroid disease    TIA (transient ischemic attack)    Unruptured popliteal cyst 06/24/2014   Right knee    Unspecified essential hypertension    Unspecified glaucoma(365.9)    Unspecified hypothyroidism    Unspecified vitamin D deficiency    Past Surgical History:  Procedure Laterality Date   ABDOMINAL HYSTERECTOMY  1975   Dr Mallie Mussel   APPENDECTOMY  1988   cardiolite myocardial perfusion study     DOPPLER [place] Bilateral 2009   cataract removed right eye, Dr Charise Killian   FOOT SURGERY  august 2013   Hewitt, MD   INGUINAL HERNIA REPAIR Bilateral    w/mesh   INGUINAL HERNIA REPAIR Left 08/03/2017   Procedure: LAPAROSCOPIC LEFT INGUINAL HERNIA REPAIR WITH MESH;  Surgeon: Meredith Pickerel, MD;  Location: [facility];  Service: General;  Laterality: Left;   INGUINAL HERNIA REPAIR Right 08/03/2017   Procedure: HERNIA REPAIR RIGHT INGUINAL ADULT WITH MESH;  Surgeon: Meredith Pickerel, MD;  Location: [facility];  Service: General;  Laterality: Right;   INGUINAL HERNIA REPAIR     Dr. Redmond Pulling 04-24-18   INGUINAL HERNIA REPAIR Right 04/24/2018   Procedure: DIAGNOSTIC LAPAROSCOPIC, OPENREPAIR OF RECURRENT RIGHT INGUINAL HERNIA WITH MESH ERAS PATHWAY;  Surgeon: Meredith Pickerel, MD;  Location: [facility];  Service: General;  Laterality: Right;   INSERTION OF MESH Bilateral 08/03/2017   Procedure: INSERTION OF MESH;  Surgeon: Meredith Pickerel, MD;  Location: [facility];  Service: General;  Laterality: Bilateral;   NM MYOVIEW LTD     negative   TONSILLECTOMY  1937   TOOTH EXTRACTION  09/16/13   Dr Carlos American    Allergies  Allergen Reactions   Trimethoprim Other (See Comments)    Headaches/ ear pressure    Erythromycin Other (See Comments)    UNSPECIFIED REACTION    Macrodantin  [Nitrofurantoin Macrocrystal] Other (See Comments)    UNSPECIFIED REACTION    Penicillins Other (See Comments)    UNSPECIFIED REACTION  Has patient had a PCN reaction causing immediate rash, facial/tongue/throat swelling, SOB or lightheadedness with hypotension: Unknown Has patient had a PCN reaction causing severe rash involving mucus membranes or skin necrosis: Unknown Has patient had a PCN reaction that required hospitalization: No Has patient had a PCN reaction occurring within the last 10 years: No If all of the above answers are "NO", then may proceed with Cephalosporin use.   Sulfa Antibiotics Other (See Comments)    UNSPECIFIED REACTION    Latex Itching    Outpatient Encounter Medications as of 11/12/2020  Medication Sig   acetaminophen (TYLENOL) 500 MG tablet Take 500 mg by mouth 2 (two) times daily as needed for mild pain or headache.    bimatoprost (LUMIGAN) 0.01 % SOLN Place 1 drop into both eyes at bedtime.   Calcium Carbonate-Vitamin D 600-400 MG-UNIT chew tablet Chew 1 tablet by mouth 2 (two) times daily.    camphor-menthol (SARNA) lotion Apply 1 application topically  as needed for itching. Prescribed by Dermatology at [facility]   Cholecalciferol (VITAMIN D3) 50 MCG (2000 UT) TABS Take by mouth.   clopidogrel (PLAVIX) 75 MG tablet 1 tablet daily   CRANBERRY EXTRACT PO Take 2 tablets by mouth daily as needed.   famotidine (PEPCID AC) 10 MG chewable tablet Chew 10 mg by mouth at bedtime as needed for heartburn.   hydrocortisone cream 1 % Apply 1 application topically daily as needed for itching.    metoprolol succinate (TOPROL-XL) 25 MG 24 hr tablet TAKE 1 TABLET ONCE DAILY TO REGULATE HEART AND CONTROL BLOOD PRESSURE.   Multiple Vitamins-Minerals (PRESERVISION AREDS 2 PO) Take 1 capsule by mouth 2 (two) times daily.    Polyethyl Glycol-Propyl Glycol 0.4-0.3 % SOLN Place 1 vial into both eyes 2 (two) times daily. May use another dose during the day as needed for dry eye    psyllium (METAMUCIL) 58.6 % packet Take 1 packet by mouth daily. Mix with water   vitamin C (ASCORBIC ACID) 500 MG tablet Take 500 mg by mouth daily with supper.    [DISCONTINUED] doxycycline (VIBRA-TABS) 100 MG tablet Take 1 tablet (100 mg total) by mouth 2 (two) times daily. (Patient not taking: Reported on 10/19/2020)   No facility-administered encounter medications on file as of 11/12/2020.    Review of Systems  Constitutional:  Negative for chills, fatigue and fever.  HENT:  Negative for congestion, rhinorrhea, sinus pressure, sinus pain, sneezing and sore throat.   Eyes:  Negative for pain, discharge, redness, itching and visual disturbance.  Respiratory:  Negative for cough, chest tightness, shortness of breath and wheezing.   Cardiovascular:  Negative for chest pain, palpitations and leg swelling.  Gastrointestinal:  Negative for abdominal distention, abdominal pain, blood in stool, diarrhea, nausea and vomiting.  Genitourinary:  Negative for hematuria and vaginal bleeding.  Skin:  Negative for pallor and rash.       Purple spot on below left eyelid/nasal bridge per HPI   Neurological:  Negative for dizziness, syncope, speech difficulty, weakness, light-headedness, numbness and headaches.  Hematological:  Bruises/bleeds easily.   Immunization History  Administered Date(s) Administered   Fluad Quad(high Dose 65+) 02/04/2019   Influenza Whole 02/24/2011, 02/12/2012   Influenza, High Dose Seasonal PF 02/01/2017, 02/25/2018, 02/11/2020   Influenza,inj,Quad PF,6+ Mos 02/20/2013, 02/10/2014, 02/19/2015, 02/18/2016   Moderna Sars-Covid-2 Vaccination 05/19/2019, 06/16/2019, 03/29/2020   Pneumococcal Conjugate-13 06/24/2014   Pneumococcal Polysaccharide-23 04/12/1998   Td 02/21/1996, 07/23/2003   Tdap 03/20/2010   Zoster, Live 09/15/2005   Pertinent  Health Maintenance Due  Topic Date Due   INFLUENZA VACCINE  12/13/2020   DEXA SCAN  Completed   PNA vac Low Risk Adult  Completed    Fall Risk  11/12/2020 09/09/2020 08/18/2020 06/14/2020 03/15/2020  Falls in the past year? 0 0 0 1 0  Number falls in past yr: 0 0 0 0 0  Comment - - - - -  Injury with Fall? 0 0 0 0 0  Risk for fall due to : No Fall Risks - - - -  Follow up Falls evaluation completed - - - -   Functional Status Survey:    There were no vitals filed for this visit. There is no height or weight on file to calculate BMI. Physical Exam Constitutional:      General: She is not in acute distress.    Appearance: She is not ill-appearing.  Eyes:     General:  Right eye: No discharge.        Left eye: No discharge.     Conjunctiva/sclera: Conjunctivae normal.  Pulmonary:     Effort: Pulmonary effort is normal.  Skin:    Comments: below left eyelid/nasal bridge small area purple- brownish color bruise   Neurological:     Mental Status: She is alert and oriented to person, place, and time.  Psychiatric:        Mood and Affect: Mood normal.        Behavior: Behavior normal.        Thought Content: Thought content normal.        Judgment: Judgment normal.    Labs reviewed: Recent Labs    06/09/20 0826 08/08/20 2007  NA 139 137  K 4.0 4.3  CL 105 102  CO2 28 24  GLUCOSE 84 106*  BUN 27* 27*  CREATININE 0.70 0.70  CALCIUM 9.4 9.2   Recent Labs    06/09/20 0826 08/08/20 2007  AST 17 19  ALT 11 13  ALKPHOS  --  44  BILITOT 0.5 0.6  PROT 6.0* 6.4*  ALBUMIN  --  4.1   Recent Labs    06/09/20 0826 08/08/20 2007  WBC 6.6 12.7*  NEUTROABS 3,082 10.6*  HGB 12.3 12.7  HCT 36.9 38.7  MCV 91.3 92.8  PLT 207 183   Lab Results  Component Value Date   TSH 3.87 06/23/2016   Lab Results  Component Value Date   HGBA1C 5.3 05/02/2013   Lab Results  Component Value Date   CHOL 188 07/10/2019   HDL 75 07/10/2019   LDLCALC 94 07/10/2019   TRIG 96 07/10/2019   CHOLHDL 2.5 07/10/2019    Significant Diagnostic Results in last 30 days:  No results  found.  Assessment/Plan  Bruises easily below left eyelid/nasal bridge small area purple- brownish color bruise  Unclear etiology but suspect possible bumped area on something.Purple bruise area also noted on left forearm. Will obtain lab work to evaluate her platelets.  Daughter will arrange to bring her later for lab work.[place] secretary notified to call daughter to schedule for lab. - advised to continue to monitor and notify provider if symptoms worsen or develops any other signs of bleeding.  - CBC with Differential/Platelet  Family/ staff Communication: Reviewed plan of care with patient verbalized understanding.  Labs/tests ordered: - CBC with Differential/Platelet   Next Appointment: lab later today.  I connected with  [name] on 11/12/20 by a video enabled telemedicine application and verified that I am speaking with the correct person using two identifiers.   I discussed the limitations of evaluation and management by telemedicine. The patient expressed understanding and agreed to proceed.  Spent 15 minutes of face to face on video with patient    Sandrea Hughs, NP

## 2020-11-16 ENCOUNTER — Telehealth: Payer: Self-pay

## 2020-11-16 ENCOUNTER — Other Ambulatory Visit: Payer: Self-pay

## 2020-11-16 ENCOUNTER — Encounter: Payer: Self-pay | Admitting: Nurse Practitioner

## 2020-11-16 ENCOUNTER — Ambulatory Visit (INDEPENDENT_AMBULATORY_CARE_PROVIDER_SITE_OTHER): Payer: Medicare Other | Admitting: Nurse Practitioner

## 2020-11-16 DIAGNOSIS — Z0000 Encounter for general adult medical examination without abnormal findings: Secondary | ICD-10-CM | POA: Diagnosis not present

## 2020-11-16 NOTE — Progress Notes (Signed)
Subjective:   [name] is a 87 y.o. female who presents for Medicare Annual (Subsequent) preventive examination.  Review of Systems          Objective:    There were no vitals filed for this visit. There is no height or weight on file to calculate BMI.  Advanced Directives 11/16/2020 11/12/2020 09/09/2020 08/18/2020 08/08/2020 06/14/2020 03/15/2020  Does Patient Have a Medical Advance Directive? Yes Yes Yes Yes Yes Yes Yes  Type of Paramedic of [place];Living will;Out of facility DNR (pink MOST or yellow form) [place];Living will;Out of facility DNR (pink MOST or yellow form) [place];Living will;Out of facility DNR (pink MOST or yellow form) [place];Living will;Out of facility DNR (pink MOST or yellow form) [place];Out of facility DNR (pink MOST or yellow form);Living will Out of facility DNR (pink MOST or yellow form) Out of facility DNR (pink MOST or yellow form)  Does patient want to make changes to medical advance directive? No - Patient declined No - Patient declined No - Patient declined No - Patient declined No - Patient declined No - Patient declined No - Patient declined  Copy of [place] in Chart? Yes - validated most recent copy scanned in chart (See row information) Yes - validated most recent copy scanned in chart (See row information) Yes - validated most recent copy scanned in chart (See row information) Yes - validated most recent copy scanned in chart (See row information) No - copy requested - -  Would patient like information on creating a medical advance directive? - - - - No - Patient declined - -  Pre-existing out of facility DNR order (yellow form or pink MOST form) Yellow form placed in chart (order not valid for inpatient use) - Yellow form placed in chart (order not valid for inpatient use) Yellow form placed in chart (order not valid for  inpatient use) Physician notified to receive inpatient order - -    Current Medications (verified) Outpatient Encounter Medications as of 11/16/2020  Medication Sig   acetaminophen (TYLENOL) 500 MG tablet Take 500 mg by mouth 2 (two) times daily as needed for mild pain or headache.    bimatoprost (LUMIGAN) 0.01 % SOLN Place 1 drop into both eyes at bedtime.   Calcium Carbonate-Vitamin D 600-400 MG-UNIT chew tablet Chew 1 tablet by mouth 2 (two) times daily.    camphor-menthol (SARNA) lotion Apply 1 application topically as needed for itching. Prescribed by Dermatology at [facility]   Cholecalciferol (VITAMIN D3) 50 MCG (2000 UT) TABS Take by mouth.   clopidogrel (PLAVIX) 75 MG tablet 1 tablet daily   CRANBERRY EXTRACT PO Take 2 tablets by mouth daily as needed.   famotidine (PEPCID AC) 10 MG chewable tablet Chew 10 mg by mouth at bedtime as needed for heartburn.   hydrocortisone cream 1 % Apply 1 application topically daily as needed for itching.    metoprolol succinate (TOPROL-XL) 25 MG 24 hr tablet TAKE 1 TABLET ONCE DAILY TO REGULATE HEART AND CONTROL BLOOD PRESSURE.   Multiple Vitamins-Minerals (PRESERVISION AREDS 2 PO) Take 1 capsule by mouth 2 (two) times daily.    Polyethyl Glycol-Propyl Glycol 0.4-0.3 % SOLN Place 1 vial into both eyes 2 (two) times daily. May use another dose during the day as needed for dry eye   psyllium (METAMUCIL) 58.6 % packet Take 1 packet by mouth daily. Mix with water  vitamin C (ASCORBIC ACID) 500 MG tablet Take 500 mg by mouth daily with supper.    No facility-administered encounter medications on file as of 11/16/2020.    Allergies (verified) Trimethoprim, Erythromycin, Macrodantin [nitrofurantoin macrocrystal], Penicillins, Sulfa antibiotics, and Latex   History: Past Medical History:  Diagnosis Date   Cervicalgia    Chest pain at rest 01/07/2012   Displacement of cervical intervertebral disc without myelopathy    Diverticulosis of colon (without  mention of hemorrhage)    Female stress incontinence    GERD (gastroesophageal reflux disease)    no peds occasional pepcid   Inguinal hernia without mention of obstruction or gangrene, unilateral or unspecified, (not specified as recurrent)    Internal hemorrhoids without mention of complication    Lumbago    Macular degeneration (senile) of retina, unspecified    Osteoarthrosis, unspecified whether generalized or localized, unspecified site    Other and unspecified hyperlipidemia    Pain in joint, hand    Palpitations    Reflux esophagitis    Scoliosis (and kyphoscoliosis), idiopathic    Senile osteoporosis    Skin disorder    Thyroid disease    TIA (transient ischemic attack)    Unruptured popliteal cyst 06/24/2014   Right knee    Unspecified essential hypertension    Unspecified glaucoma(365.9)    Unspecified hypothyroidism    Unspecified vitamin D deficiency    Past Surgical History:  Procedure Laterality Date   ABDOMINAL HYSTERECTOMY  1975   Dr Mallie Mussel   APPENDECTOMY  1988   cardiolite myocardial perfusion study     DOPPLER [place] Bilateral 2009   cataract removed right eye, Dr Charise Killian   FOOT SURGERY  august 2013   [place], MD   INGUINAL HERNIA REPAIR Bilateral    w/mesh   INGUINAL HERNIA REPAIR Left 08/03/2017   Procedure: LAPAROSCOPIC LEFT INGUINAL HERNIA REPAIR WITH MESH;  Surgeon: Greer Pickerel, MD;  Location: [facility];  Service: General;  Laterality: Left;   INGUINAL HERNIA REPAIR Right 08/03/2017   Procedure: HERNIA REPAIR RIGHT INGUINAL ADULT WITH MESH;  Surgeon: Greer Pickerel, MD;  Location: [facility];  Service: General;  Laterality: Right;   INGUINAL HERNIA REPAIR     Dr. Redmond Pulling 04-24-18   INGUINAL HERNIA REPAIR Right 04/24/2018   Procedure: DIAGNOSTIC LAPAROSCOPIC, OPENREPAIR OF RECURRENT RIGHT INGUINAL HERNIA WITH MESH ERAS PATHWAY;  Surgeon: Greer Pickerel, MD;  Location: [facility];  Service: General;  Laterality: Right;   INSERTION OF MESH  Bilateral 08/03/2017   Procedure: INSERTION OF MESH;  Surgeon: Greer Pickerel, MD;  Location: [facility];  Service: General;  Laterality: Bilateral;   NM MYOVIEW LTD     negative   TONSILLECTOMY  1937   TOOTH EXTRACTION  09/16/13   Dr Carlos American   Family History  Problem Relation Age of Onset   CAD Mother    Parkinson's disease Mother    Cancer Father        colon cancer   Parkinson's disease Father    Stroke Maternal Grandmother    Social History   Socioeconomic History   Marital status: Widowed    Spouse name: Not on file   Number of children: 2   Years of education: college   Highest education level: Not on file  Occupational History    Comment: retired  Tobacco Use   Smoking status: Never   Smokeless tobacco: Never  Vaping Use   Vaping Use: Never used  Substance and Sexual  Activity   Alcohol use: No   Drug use: No   Sexual activity: Not Currently  Other Topics Concern   Not on file  Social History Narrative   Patient lives at home with her daughter [name]) , [facility] 02/02/16 [place]    Widowed.   Retired.   EducationNurse, mental health.   Right handed.   Caffeine- None some times tea very rare.   Never smoked   Alcohol none   Social Determinants of Health   Financial Resource Strain: Not on file  Food Insecurity: Not on file  Transportation Needs: Not on file  Physical Activity: Not on file  Stress: Not on file  Social Connections: Not on file    Tobacco Counseling Counseling given: Not Answered   Clinical Intake:                 Diabetic?         Activities of Daily Living No flowsheet data found.  Patient Care Team: Wardell Honour, MD as PCP - General (Family Medicine) Lorretta Harp, MD as PCP - Cardiology (Cardiology) Laurence Spates, MD (Inactive) as Consulting Physician (Gastroenterology) Myrlene Broker, MD as Attending Physician (Urology) Lorretta Harp, MD as Consulting Physician (Cardiology) Rozetta Nunnery, MD as Consulting Physician (Otolaryngology) Richarda Osmond, DDS (Dentistry) Shon Hough, MD as Consulting Physician (Ophthalmology) Iran Planas, MD as Consulting Physician (Orthopedic Surgery) Greer Pickerel, MD as Consulting Physician (General Surgery)  Indicate any recent Medical Services you may have received from other than [facility] providers in the past year (date may be approximate).     Assessment:   This is a routine wellness examination for [facility].  Hearing/Vision screen Hearing Screening - Comments:: Patient has no hearing problems Vision Screening - Comments:: Patient states that her vision is changing. Patient's last eye exam was May 2022. Patient Goes to [facility].  Dietary issues and exercise activities discussed:     Goals Addressed   None    Depression Screen PHQ 2/9 Scores 11/16/2020 06/14/2020 11/13/2019 10/27/2019 03/10/2019 11/04/2018 10/18/2018  PHQ - 2 Score 0 0 0 0 0 0 0    Fall Risk Fall Risk  11/16/2020 11/12/2020 09/09/2020 08/18/2020 06/14/2020  Falls in the past year? 0 0 0 0 1  Number falls in past yr: 0 0 0 0 0  Comment - - - - -  Injury with Fall? 0 0 0 0 0  Risk for fall due to : No Fall Risks No Fall Risks - - -  Follow up Falls evaluation completed Falls evaluation completed - - -    FALL RISK PREVENTION PERTAINING TO THE HOME:  Any stairs in or around the home? Yes  If so, are there any without handrails? Yes  Home free of loose throw rugs in walkways, pet beds, electrical cords, etc? Yes  Adequate lighting in your home to reduce risk of falls? Yes   ASSISTIVE DEVICES UTILIZED TO PREVENT FALLS:  Life alert? Yes  Use of a cane, walker or w/c? Yes  Grab bars in the bathroom? Yes  Shower chair or bench in shower? Yes  Elevated toilet seat or a handicapped toilet? Yes   TIMED UP AND GO:  Was the test performed? No .   Cognitive Function: MMSE - Mini Mental State Exam 10/15/2017 06/27/2016 06/22/2015 06/24/2014  Not  completed: - - (No Data) -  Orientation to time 5 5 5 5   Orientation to Place 5 5 5  5  Registration 3 3 3 3   Attention/ Calculation 5 5 5 5   Recall 3 1 1 1   Language- name 2 objects 2 2 2 2   Language- repeat 1 1 1 1   Language- follow 3 step command 3 3 3 3   Language- read & follow direction 1 1 1 1   Write a sentence 1 1 1 1   Copy design 1 1 1 1   Total score 30 28 28 28      6CIT Screen 10/27/2019 10/18/2018  What Year? 0 points 0 points  What month? 0 points 0 points  What time? 0 points 0 points  Count back from 20 0 points 0 points  Months in reverse 0 points 0 points  Repeat phrase 2 points 0 points  Total Score 2 0    Immunizations Immunization History  Administered Date(s) Administered   Fluad Quad(high Dose 65+) 02/04/2019   Influenza Whole 02/24/2011, 02/12/2012   Influenza, High Dose Seasonal PF 02/01/2017, 02/25/2018, 02/11/2020   Influenza,inj,Quad PF,6+ Mos 02/20/2013, 02/10/2014, 02/19/2015, 02/18/2016   Moderna Sars-Covid-2 Vaccination 05/19/2019, 06/16/2019, 03/29/2020, 09/27/2020   Pneumococcal Conjugate-13 06/24/2014   Pneumococcal Polysaccharide-23 04/12/1998   Td 02/21/1996, 07/23/2003   Tdap 03/20/2010   Zoster Recombinat (Shingrix) 09/19/2016, 12/02/2016   Zoster, Live 09/15/2005    TDAP status: Due, Education has been provided regarding the importance of this vaccine. Advised may receive this vaccine at local pharmacy or Health Dept. Aware to provide a copy of the vaccination record if obtained from local pharmacy or Health Dept. Verbalized acceptance and understanding.  Flu Vaccine status: Up to date  Pneumococcal vaccine status: Up to date  Covid-19 vaccine status: Completed vaccines  Qualifies for Shingles Vaccine? Yes   Zostavax completed Yes   Shingrix Completed?: Yes  Screening Tests Health Maintenance  Topic Date Due   TETANUS/TDAP  03/20/2020   INFLUENZA VACCINE  12/13/2020   DEXA SCAN  Completed   COVID-19 Vaccine  Completed   PNA  vac Low Risk Adult  Completed   Zoster Vaccines- Shingrix  Completed   HPV VACCINES  Aged Out    Health Maintenance  Health Maintenance Due  Topic Date Due   TETANUS/TDAP  03/20/2020    Colorectal cancer screening: No longer required.   Mammogram status: No longer required due to age.  Bone Density status: Completed 03/05/2019. Results reflect: Bone density results: OSTEOPOROSIS. Repeat every 2 years.  Lung Cancer Screening: (Low Dose CT Chest recommended if Age 59-80 years, 30 pack-year currently smoking OR have quit w/in 15years.) does not qualify.   Lung Cancer Screening Referral: na  Additional Screening:  Hepatitis C Screening: does not qualify; Completed na  Vision Screening: Recommended annual ophthalmology exams for early detection of glaucoma and other disorders of the eye. Is the patient up to date with their annual eye exam?  Yes  Who is the provider or what is the name of the office in which the patient attends annual eye exams? [facility]. If pt is not established with a provider, would they like to be referred to a provider to establish care? No .   Dental Screening: Recommended annual dental exams for proper oral hygiene  Community Resource Referral / Chronic Care Management: CRR required this visit?  No   CCM required this visit?  No      Plan:     I have personally reviewed and noted the following in the patient's chart:   Medical and social history Use of alcohol, tobacco or illicit drugs  Current  medications and supplements including opioid prescriptions.  Functional ability and status Nutritional status Physical activity Advanced directives List of other physicians Hospitalizations, surgeries, and ER visits in previous 12 months Vitals Screenings to include cognitive, depression, and falls Referrals and appointments  In addition, I have reviewed and discussed with patient certain preventive protocols, quality metrics, and  best practice recommendations. A written personalized care plan for preventive services as well as general preventive health recommendations were provided to patient.     Lauree Chandler, NP   11/16/2020    Virtual Visit via Telephone Note  I connected withNAME@ on 11/16/20 at  1:00 PM EDT by telephone and verified that I am speaking with the correct person using two identifiers.  Location: Patient: home Provider: [place]   I discussed the limitations, risks, security and privacy concerns of performing an evaluation and management service by telephone and the availability of in person appointments. I also discussed with the patient that there may be a patient responsible charge related to this service. The patient expressed understanding and agreed to proceed.   I discussed the assessment and treatment plan with the patient. The patient was provided an opportunity to ask questions and all were answered. The patient agreed with the plan and demonstrated an understanding of the instructions.   The patient was advised to call back or seek an in-person evaluation if the symptoms worsen or if the condition fails to improve as anticipated.  I provided 15 minutes of non-face-to-face time during this encounter.  Carlos American. Harle Battiest Avs printed and mailed

## 2020-11-16 NOTE — Progress Notes (Signed)
This service is provided via telemedicine  No vital signs collected/recorded due to the encounter was a telemedicine visit.   Location of patient (ex: home, work):  Home  Patient consents to a telephone visit:  Yes, see encounter dated 11/12/2020  Location of the provider (ex: office, home):  [place]   Name of any referring provider:  Alain Honey, MD  Names of all persons participating in the telemedicine service and their role in the encounter:  Sherrie Mustache, Nurse Practitioner, Carroll Kinds, CMA, and patient.    Time spent on call:  14 minutes with medical assistant

## 2020-11-16 NOTE — Patient Instructions (Signed)
[name] , Thank you for taking time to come for your Medicare Wellness Visit. I appreciate your ongoing commitment to your health goals. Please review the following plan we discussed and let me know if I can assist you in the future.   Screening recommendations/referrals: Colonoscopy aged out Mammogram aged out Bone Density DUE in November Recommended yearly ophthalmology/optometry visit for glaucoma screening and checkup Recommended yearly dental visit for hygiene and checkup  Vaccinations: Influenza vaccine up to date Pneumococcal vaccine up to date Tdap vaccine Recommended- to get at your local pharmacy  Shingles vaccine up to date    Advanced directives: on file.   Conditions/risks identified: fall risk, advanced age.  Next appointment: 1 year for awv   Preventive Care 85 Years and Older, Female Preventive care refers to lifestyle choices and visits with your health care provider that can promote health and wellness. What does preventive care include? A yearly physical exam. This is also called an annual well check. Dental exams once or twice a year. Routine eye exams. Ask your health care provider how often you should have your eyes checked. Personal lifestyle choices, including: Daily care of your teeth and gums. Regular physical activity. Eating a healthy diet. Avoiding tobacco and drug use. Limiting alcohol use. Practicing safe sex. Taking low-dose aspirin every day. Taking vitamin and mineral supplements as recommended by your health care provider. What happens during an annual well check? The services and screenings done by your health care provider during your annual well check will depend on your age, overall health, lifestyle risk factors, and family history of disease. Counseling  Your health care provider may ask you questions about your: Alcohol use. Tobacco use. Drug use. Emotional well-being. Home and relationship well-being. Sexual  activity. Eating habits. History of falls. Memory and ability to understand (cognition). Work and work Statistician. Reproductive health. Screening  You may have the following tests or measurements: Height, weight, and BMI. Blood pressure. Lipid and cholesterol levels. These may be checked every 5 years, or more frequently if you are over 85 years old. Skin check. Lung cancer screening. You may have this screening every year starting at age 85 if you have a 30-pack-year history of smoking and currently smoke or have quit within the past 15 years. Fecal occult blood test (FOBT) of the stool. You may have this test every year starting at age 85. Flexible sigmoidoscopy or colonoscopy. You may have a sigmoidoscopy every 5 years or a colonoscopy every 10 years starting at age 85. Hepatitis C blood test. Hepatitis B blood test. Sexually transmitted disease (STD) testing. Diabetes screening. This is done by checking your blood sugar (glucose) after you have not eaten for a while (fasting). You may have this done every 1-3 years. Bone density scan. This is done to screen for osteoporosis. You may have this done starting at age 14. Mammogram. This may be done every 1-2 years. Talk to your health care provider about how often you should have regular mammograms. Talk with your health care provider about your test results, treatment options, and if necessary, the need for more tests. Vaccines  Your health care provider may recommend certain vaccines, such as: Influenza vaccine. This is recommended every year. Tetanus, diphtheria, and acellular pertussis (Tdap, Td) vaccine. You may need a Td booster every 10 years. Zoster vaccine. You may need this after age 38. Pneumococcal 13-valent conjugate (PCV13) vaccine. One dose is recommended after age 4. Pneumococcal polysaccharide (PPSV23) vaccine. One dose is recommended after  age 25. Talk to your health care provider about which screenings and vaccines  you need and how often you need them. This information is not intended to replace advice given to you by your health care provider. Make sure you discuss any questions you have with your health care provider. Document Released: 05/28/2015 Document Revised: 01/19/2016 Document Reviewed: 03/02/2015 Elsevier Interactive Patient Education  2017 [employer] Prevention in the Home Falls can cause injuries. They can happen to people of all ages. There are many things you can do to make your home safe and to help prevent falls. What can I do on the outside of my home? Regularly fix the edges of walkways and driveways and fix any cracks. Remove anything that might make you trip as you walk through a door, such as a raised step or threshold. Trim any bushes or trees on the path to your home. Use bright outdoor lighting. Clear any walking paths of anything that might make someone trip, such as rocks or tools. Regularly check to see if handrails are loose or broken. Make sure that both sides of any steps have handrails. Any raised decks and porches should have guardrails on the edges. Have any leaves, snow, or ice cleared regularly. Use sand or salt on walking paths during winter. Clean up any spills in your garage right away. This includes oil or grease spills. What can I do in the bathroom? Use night lights. Install grab bars by the toilet and in the tub and shower. Do not use towel bars as grab bars. Use non-skid mats or decals in the tub or shower. If you need to sit down in the shower, use a plastic, non-slip stool. Keep the floor dry. Clean up any water that spills on the floor as soon as it happens. Remove soap buildup in the tub or shower regularly. Attach bath mats securely with double-sided non-slip rug tape. Do not have throw rugs and other things on the floor that can make you trip. What can I do in the bedroom? Use night lights. Make sure that you have a light by your bed that  is easy to reach. Do not use any sheets or blankets that are too big for your bed. They should not hang down onto the floor. Have a firm chair that has side arms. You can use this for support while you get dressed. Do not have throw rugs and other things on the floor that can make you trip. What can I do in the kitchen? Clean up any spills right away. Avoid walking on wet floors. Keep items that you use a lot in easy-to-reach places. If you need to reach something above you, use a strong step stool that has a grab bar. Keep electrical cords out of the way. Do not use floor polish or wax that makes floors slippery. If you must use wax, use non-skid floor wax. Do not have throw rugs and other things on the floor that can make you trip. What can I do with my stairs? Do not leave any items on the stairs. Make sure that there are handrails on both sides of the stairs and use them. Fix handrails that are broken or loose. Make sure that handrails are as long as the stairways. Check any carpeting to make sure that it is firmly attached to the stairs. Fix any carpet that is loose or worn. Avoid having throw rugs at the top or bottom of the stairs. If you do  have throw rugs, attach them to the floor with carpet tape. Make sure that you have a light switch at the top of the stairs and the bottom of the stairs. If you do not have them, ask someone to add them for you. What else can I do to help prevent falls? Wear shoes that: Do not have high heels. Have rubber bottoms. Are comfortable and fit you well. Are closed at the toe. Do not wear sandals. If you use a stepladder: Make sure that it is fully opened. Do not climb a closed stepladder. Make sure that both sides of the stepladder are locked into place. Ask someone to hold it for you, if possible. Clearly mark and make sure that you can see: Any grab bars or handrails. First and last steps. Where the edge of each step is. Use tools that help you  move around (mobility aids) if they are needed. These include: Canes. Walkers. Scooters. Crutches. Turn on the lights when you go into a dark area. Replace any light bulbs as soon as they burn out. Set up your furniture so you have a clear path. Avoid moving your furniture around. If any of your floors are uneven, fix them. If there are any pets around you, be aware of where they are. Review your medicines with your doctor. Some medicines can make you feel dizzy. This can increase your chance of falling. Ask your doctor what other things that you can do to help prevent falls. This information is not intended to replace advice given to you by your health care provider. Make sure you discuss any questions you have with your health care provider. Document Released: 02/25/2009 Document Revised: 10/07/2015 Document Reviewed: 06/05/2014 Elsevier Interactive Patient Education  2017 [employer].

## 2020-11-16 NOTE — Telephone Encounter (Signed)
Noted thank you

## 2020-11-16 NOTE — Telephone Encounter (Signed)
Message left on clinical intake voicemail:   Patient had a telephone visit with Janett Billow today and she forgot to tell her some things that she may want to go back and add to her note  1.) Patient gets eye injections in her right eye every month since January 2022  2.) Patient noticed on her mychart account that it says aged out of mammograms and she will continue to get mammograms yearly   3.) Patient goes to the dentist every 4 months and her teeth are in good condition.

## 2020-11-19 ENCOUNTER — Ambulatory Visit: Payer: Self-pay | Admitting: Nurse Practitioner

## 2020-11-24 ENCOUNTER — Ambulatory Visit (INDEPENDENT_AMBULATORY_CARE_PROVIDER_SITE_OTHER): Payer: Medicare Other | Admitting: Family Medicine

## 2020-11-24 ENCOUNTER — Encounter: Payer: Self-pay | Admitting: Family Medicine

## 2020-11-24 ENCOUNTER — Other Ambulatory Visit: Payer: Self-pay

## 2020-11-24 VITALS — BP 118/76 | HR 61 | Temp 97.5°F | Ht 60.0 in | Wt 127.6 lb

## 2020-11-24 DIAGNOSIS — R1031 Right lower quadrant pain: Secondary | ICD-10-CM

## 2020-11-24 DIAGNOSIS — N393 Stress incontinence (female) (male): Secondary | ICD-10-CM | POA: Diagnosis not present

## 2020-11-24 NOTE — Progress Notes (Signed)
Provider:  Alain Honey, MD  Careteam: Patient Care Team: Wardell Honour, MD as PCP - General (Family Medicine) Lorretta Harp, MD as PCP - Cardiology (Cardiology) Laurence Spates, MD (Inactive) as Consulting Physician (Gastroenterology) Myrlene Broker, MD as Attending Physician (Urology) Lorretta Harp, MD as Consulting Physician (Cardiology) Rozetta Nunnery, MD as Consulting Physician (Otolaryngology) Richarda Osmond, DDS (Dentistry) Shon Hough, MD as Consulting Physician (Ophthalmology) Iran Planas, MD as Consulting Physician (Orthopedic Surgery) Greer Pickerel, MD as Consulting Physician (General Surgery)  [name]:  [place]  Advanced Directive information    Allergies  Allergen Reactions   Trimethoprim Other (See Comments)    Headaches/ ear pressure    Erythromycin Other (See Comments)    UNSPECIFIED REACTION    Macrodantin [Nitrofurantoin Macrocrystal] Other (See Comments)    UNSPECIFIED REACTION    Penicillins Other (See Comments)    UNSPECIFIED REACTION  Has patient had a PCN reaction causing immediate rash, facial/tongue/throat swelling, SOB or lightheadedness with hypotension: Unknown Has patient had a PCN reaction causing severe rash involving mucus membranes or skin necrosis: Unknown Has patient had a PCN reaction that required hospitalization: No Has patient had a PCN reaction occurring within the last 10 years: No If all of the above answers are "NO", then may proceed with Cephalosporin use.   Sulfa Antibiotics Other (See Comments)    UNSPECIFIED REACTION    Latex Itching    Chief Complaint  Patient presents with   Acute Visit    Patient reports left side pain since Sunday, 11/21/2020. She reports that pain is stronger today, 11/24/20 then yesterday, 11/23/2020. She denies taking any medication for the pain. She does not have constipation.  Patient reports urinary incontinence for a while now and seems to have  increased and she wanting to start taking something for it.       HPI: Patient is a 87 y.o. female patient has had some left-sided pain for a few days.  She denies constipation.  There is no history of diverticular disease.  There is no respiratory symptoms such as cough or shortness of breath.  What ever the cause it has mostly resolved today. She also was seen remotely for some bruising under her left eyelid that has also resolved.  At that time she had a CBC with normal platelet count.  She is on Plavix.  Review of Systems:  Review of Systems  Gastrointestinal:  Positive for abdominal pain.   Past Medical History:  Diagnosis Date   Cervicalgia    Chest pain at rest 01/07/2012   Displacement of cervical intervertebral disc without myelopathy    Diverticulosis of colon (without mention of hemorrhage)    Female stress incontinence    GERD (gastroesophageal reflux disease)    no peds occasional pepcid   Inguinal hernia without mention of obstruction or gangrene, unilateral or unspecified, (not specified as recurrent)    Internal hemorrhoids without mention of complication    Lumbago    Macular degeneration (senile) of retina, unspecified    Osteoarthrosis, unspecified whether generalized or localized, unspecified site    Other and unspecified hyperlipidemia    Pain in joint, hand    Palpitations    Reflux esophagitis    Scoliosis (and kyphoscoliosis), idiopathic    Senile osteoporosis    Skin disorder    Thyroid disease    TIA (transient ischemic attack)    Unruptured popliteal cyst 06/24/2014   Right knee    Unspecified  essential hypertension    Unspecified glaucoma(365.9)    Unspecified hypothyroidism    Unspecified vitamin D deficiency    Past Surgical History:  Procedure Laterality Date   ABDOMINAL HYSTERECTOMY  1975   Dr Mallie Mussel   APPENDECTOMY  1988   cardiolite myocardial perfusion study     DOPPLER ECHOCARDIOGRAPHY     EYE SURGERY Bilateral 2009   cataract removed  right eye, Dr Charise Killian   FOOT SURGERY  august 2013   Hewitt, MD   INGUINAL HERNIA REPAIR Bilateral    w/mesh   INGUINAL HERNIA REPAIR Left 08/03/2017   Procedure: LAPAROSCOPIC LEFT INGUINAL HERNIA REPAIR WITH MESH;  Surgeon: Greer Pickerel, MD;  Location: [place];  Service: General;  Laterality: Left;   INGUINAL HERNIA REPAIR Right 08/03/2017   Procedure: HERNIA REPAIR RIGHT INGUINAL ADULT WITH MESH;  Surgeon: Greer Pickerel, MD;  Location: [place];  Service: General;  Laterality: Right;   INGUINAL HERNIA REPAIR     Dr. Redmond Pulling 04-24-18   INGUINAL HERNIA REPAIR Right 04/24/2018   Procedure: DIAGNOSTIC LAPAROSCOPIC, OPENREPAIR OF RECURRENT RIGHT INGUINAL HERNIA WITH MESH ERAS PATHWAY;  Surgeon: Greer Pickerel, MD;  Location: [facility];  Service: General;  Laterality: Right;   INSERTION OF MESH Bilateral 08/03/2017   Procedure: INSERTION OF MESH;  Surgeon: Greer Pickerel, MD;  Location: [place];  Service: General;  Laterality: Bilateral;   NM MYOVIEW LTD     negative   TONSILLECTOMY  1937   TOOTH EXTRACTION  09/16/13   Dr Carlos American   Social History:   reports that she has never smoked. She has never used smokeless tobacco. She reports that she does not drink alcohol and does not use drugs.  Family History  Problem Relation Age of Onset   CAD Mother    Parkinson's disease Mother    Cancer Father        colon cancer   Parkinson's disease Father    Stroke Maternal Grandmother     Medications: Patient's Medications  New Prescriptions   No medications on file  Previous Medications   ACETAMINOPHEN (TYLENOL) 500 MG TABLET    Take 500 mg by mouth 2 (two) times daily as needed for mild pain or headache.    BIMATOPROST (LUMIGAN) 0.01 % SOLN    Place 1 drop into both eyes at bedtime.   CALCIUM CARBONATE-VITAMIN D 600-400 MG-UNIT CHEW TABLET    Chew 1 tablet by mouth 2 (two) times daily.    CAMPHOR-MENTHOL (SARNA) LOTION    Apply 1 application topically as needed for itching. Prescribed by Dermatology at [place] (VITAMIN D3) 50 MCG (2000 UT) TABS    Take by mouth.   CLOPIDOGREL (PLAVIX) 75 MG TABLET    1 tablet daily   CRANBERRY EXTRACT PO    Take 2 tablets by mouth daily as needed.   FAMOTIDINE (PEPCID AC) 10 MG CHEWABLE TABLET    Chew 10 mg by mouth at bedtime as needed for heartburn.   HYDROCORTISONE CREAM 1 %    Apply 1 application topically daily as needed for itching.    METOPROLOL SUCCINATE (TOPROL-XL) 25 MG 24 HR TABLET    TAKE 1 TABLET ONCE DAILY TO REGULATE HEART AND CONTROL BLOOD PRESSURE.   MULTIPLE VITAMINS-MINERALS (PRESERVISION AREDS 2 PO)    Take 1 capsule by mouth 2 (two) times daily.    POLYETHYL GLYCOL-PROPYL GLYCOL 0.4-0.3 % SOLN    Place 1 vial into both eyes 2 (two) times daily. May use  another dose during the day as needed for dry eye   PSYLLIUM (METAMUCIL) 58.6 % PACKET    Take 1 packet by mouth daily. Mix with water   VITAMIN C (ASCORBIC ACID) 500 MG TABLET    Take 500 mg by mouth daily with supper.   Modified Medications   No medications on file  Discontinued Medications   No medications on file    Physical Exam:  Vitals:   11/24/20 0905  BP: 118/76  Pulse: 61  Temp: (!) 97.5 F (36.4 C)  TempSrc: Temporal  SpO2: 97%  Weight: 127 lb 9.6 oz (57.9 kg)  Height: 5' (1.524 m)   Body mass index is 24.92 kg/m. Wt Readings from Last 3 Encounters:  11/24/20 127 lb 9.6 oz (57.9 kg)  10/19/20 128 lb (58.1 kg)  09/14/20 129 lb (58.5 kg)    Physical Exam Vitals and nursing note reviewed.  Constitutional:      Appearance: Normal appearance.  Cardiovascular:     Rate and Rhythm: Normal rate and regular rhythm.  Pulmonary:     Effort: Pulmonary effort is normal.     Breath sounds: Normal breath sounds.  Abdominal:     General: Abdomen is flat. Bowel sounds are normal.     Palpations: Abdomen is soft.  Neurological:     General: No focal deficit present.     Mental Status: She is alert and oriented to person, place, and time.  Psychiatric:         Mood and Affect: Mood normal.        Behavior: Behavior normal.    Labs reviewed: Basic Metabolic Panel: Recent Labs    06/09/20 0826 08/08/20 2007  NA 139 137  K 4.0 4.3  CL 105 102  CO2 28 24  GLUCOSE 84 106*  BUN 27* 27*  CREATININE 0.70 0.70  CALCIUM 9.4 9.2   Liver Function Tests: Recent Labs    06/09/20 0826 08/08/20 2007  AST 17 19  ALT 11 13  ALKPHOS  --  44  BILITOT 0.5 0.6  PROT 6.0* 6.4*  ALBUMIN  --  4.1   No results for input(s): LIPASE, AMYLASE in the last 8760 hours. No results for input(s): AMMONIA in the last 8760 hours. CBC: Recent Labs    06/09/20 0826 08/08/20 2007 11/12/20 1103  WBC 6.6 12.7* 6.9  NEUTROABS 3,082 10.6* 3,823  HGB 12.3 12.7 11.7  HCT 36.9 38.7 36.0  MCV 91.3 92.8 92.8  PLT 207 183 257   Lipid Panel: No results for input(s): CHOL, HDL, LDLCALC, TRIG, CHOLHDL, LDLDIRECT in the last 8760 hours. TSH: No results for input(s): TSH in the last 8760 hours. A1C: Lab Results  Component Value Date   HGBA1C 5.3 05/02/2013     Assessment/Plan  1. Female stress incontinence Talked about the need to empty bladder frequently.  This is usually not much of a problem.  It has been a while since last voiding before most recent episode of incontinence  2. Abdominal pain, RLQ Location suggest diverticular disease.  Even though there is no history she has had issues with constipation as I review her past medical history.  Talked about the need for adequate fluid intake as well as exercise and diet to prevent.   Alain Honey, MD [facility] [phone]

## 2020-12-01 DIAGNOSIS — H353211 Exudative age-related macular degeneration, right eye, with active choroidal neovascularization: Secondary | ICD-10-CM | POA: Diagnosis not present

## 2020-12-16 DIAGNOSIS — L814 Other melanin hyperpigmentation: Secondary | ICD-10-CM | POA: Diagnosis not present

## 2020-12-16 DIAGNOSIS — L304 Erythema intertrigo: Secondary | ICD-10-CM | POA: Diagnosis not present

## 2020-12-16 DIAGNOSIS — D692 Other nonthrombocytopenic purpura: Secondary | ICD-10-CM | POA: Diagnosis not present

## 2020-12-16 DIAGNOSIS — L821 Other seborrheic keratosis: Secondary | ICD-10-CM | POA: Diagnosis not present

## 2020-12-16 DIAGNOSIS — L853 Xerosis cutis: Secondary | ICD-10-CM | POA: Diagnosis not present

## 2020-12-16 DIAGNOSIS — D1801 Hemangioma of skin and subcutaneous tissue: Secondary | ICD-10-CM | POA: Diagnosis not present

## 2020-12-28 ENCOUNTER — Ambulatory Visit (INDEPENDENT_AMBULATORY_CARE_PROVIDER_SITE_OTHER): Payer: Medicare Other | Admitting: Family Medicine

## 2020-12-28 ENCOUNTER — Encounter: Payer: Self-pay | Admitting: Family Medicine

## 2020-12-28 ENCOUNTER — Other Ambulatory Visit: Payer: Self-pay

## 2020-12-28 VITALS — BP 128/70 | HR 69 | Temp 97.1°F | Ht 60.0 in | Wt 127.2 lb

## 2020-12-28 DIAGNOSIS — I10 Essential (primary) hypertension: Secondary | ICD-10-CM | POA: Diagnosis not present

## 2020-12-28 NOTE — Progress Notes (Signed)
Provider:  Alain Honey, MD  Careteam: Patient Care Team: Wardell Honour, MD as PCP - General (Family Medicine) Lorretta Harp, MD as PCP - Cardiology (Cardiology) Laurence Spates, MD (Inactive) as Consulting Physician (Gastroenterology) Myrlene Broker, MD as Attending Physician (Urology) Lorretta Harp, MD as Consulting Physician (Cardiology) Rozetta Nunnery, MD as Consulting Physician (Otolaryngology) Richarda Osmond, DDS (Dentistry) Shon Hough, MD as Consulting Physician (Ophthalmology) Iran Planas, MD as Consulting Physician (Orthopedic Surgery) Greer Pickerel, MD as Consulting Physician (General Surgery)  [name]:  [place]  Advanced Directive information    Allergies  Allergen Reactions   Trimethoprim Other (See Comments)    Headaches/ ear pressure    Erythromycin Other (See Comments)    UNSPECIFIED REACTION    Macrodantin [Nitrofurantoin Macrocrystal] Other (See Comments)    UNSPECIFIED REACTION    Penicillins Other (See Comments)    UNSPECIFIED REACTION  Has patient had a PCN reaction causing immediate rash, facial/tongue/throat swelling, SOB or lightheadedness with hypotension: Unknown Has patient had a PCN reaction causing severe rash involving mucus membranes or skin necrosis: Unknown Has patient had a PCN reaction that required hospitalization: No Has patient had a PCN reaction occurring within the last 10 years: No If all of the above answers are "NO", then may proceed with Cephalosporin use.   Sulfa Antibiotics Other (See Comments)    UNSPECIFIED REACTION    Latex Itching    No chief complaint on file.    HPI: Patient is a 87 y.o. female .  She is a resident at friend[facility] home and returns today for 29-monthfollow-up.  She denies any new or current symptoms.  Her only medications are Plavix and metoprolol. Weight is stable and appetite is good.  Sleeps okay.  There have been no recent falls but she goes everywhere  with her walker. Memory is good with no questions about her mind.  Review of Systems:  Review of Systems  Respiratory: Negative.    Cardiovascular: Negative.   Genitourinary:  Positive for urgency.  Musculoskeletal: Negative.   Neurological: Negative.   Psychiatric/Behavioral: Negative.    All other systems reviewed and are negative.  Past Medical History:  Diagnosis Date   Cervicalgia    Chest pain at rest 01/07/2012   Displacement of cervical intervertebral disc without myelopathy    Diverticulosis of colon (without mention of hemorrhage)    Female stress incontinence    GERD (gastroesophageal reflux disease)    no peds occasional pepcid   Inguinal hernia without mention of obstruction or gangrene, unilateral or unspecified, (not specified as recurrent)    Internal hemorrhoids without mention of complication    Lumbago    Macular degeneration (senile) of retina, unspecified    Osteoarthrosis, unspecified whether generalized or localized, unspecified site    Other and unspecified hyperlipidemia    Pain in joint, hand    Palpitations    Reflux esophagitis    Scoliosis (and kyphoscoliosis), idiopathic    Senile osteoporosis    Skin disorder    Thyroid disease    TIA (transient ischemic attack)    Unruptured popliteal cyst 06/24/2014   Right knee    Unspecified essential hypertension    Unspecified glaucoma(365.9)    Unspecified hypothyroidism    Unspecified vitamin D deficiency    Past Surgical History:  Procedure Laterality Date   ABDOMINAL HYSTERECTOMY  1975   Dr BMallie Mussel  APPENDECTOMY  1988   cardiolite myocardial perfusion study  DOPPLER ECHOCARDIOGRAPHY     EYE SURGERY Bilateral 2009   cataract removed right eye, Dr Charise Killian   FOOT SURGERY  august 2013   Hewitt, MD   INGUINAL HERNIA REPAIR Bilateral    w/mesh   INGUINAL HERNIA REPAIR Left 08/03/2017   Procedure: LAPAROSCOPIC LEFT INGUINAL HERNIA REPAIR WITH MESH;  Surgeon: Greer Pickerel, MD;  Location: [place];   Service: General;  Laterality: Left;   INGUINAL HERNIA REPAIR Right 08/03/2017   Procedure: HERNIA REPAIR RIGHT INGUINAL ADULT WITH MESH;  Surgeon: Greer Pickerel, MD;  Location: [place];  Service: General;  Laterality: Right;   INGUINAL HERNIA REPAIR     Dr. Redmond Pulling 04-24-18   INGUINAL HERNIA REPAIR Right 04/24/2018   Procedure: DIAGNOSTIC LAPAROSCOPIC, OPENREPAIR OF RECURRENT RIGHT INGUINAL HERNIA WITH MESH ERAS PATHWAY;  Surgeon: Greer Pickerel, MD;  Location: [facility];  Service: General;  Laterality: Right;   INSERTION OF MESH Bilateral 08/03/2017   Procedure: INSERTION OF MESH;  Surgeon: Greer Pickerel, MD;  Location: [place];  Service: General;  Laterality: Bilateral;   NM MYOVIEW LTD     negative   TONSILLECTOMY  1937   TOOTH EXTRACTION  09/16/13   Dr Carlos American   Social History:   reports that she has never smoked. She has never used smokeless tobacco. She reports that she does not drink alcohol and does not use drugs.  Family History  Problem Relation Age of Onset   CAD Mother    Parkinson's disease Mother    Cancer Father        colon cancer   Parkinson's disease Father    Stroke Maternal Grandmother     Medications: Patient's Medications  New Prescriptions   No medications on file  Previous Medications   ACETAMINOPHEN (TYLENOL) 500 MG TABLET    Take 500 mg by mouth 2 (two) times daily as needed for mild pain or headache.    BIMATOPROST (LUMIGAN) 0.01 % SOLN    Place 1 drop into both eyes at bedtime.   CALCIUM CARBONATE-VITAMIN D 600-400 MG-UNIT CHEW TABLET    Chew 1 tablet by mouth 2 (two) times daily.    CAMPHOR-MENTHOL (SARNA) LOTION    Apply 1 application topically as needed for itching. Prescribed by Dermatology at [place] (VITAMIN D3) 50 MCG (2000 UT) TABS    Take by mouth.   CLOPIDOGREL (PLAVIX) 75 MG TABLET    1 tablet daily   CRANBERRY EXTRACT PO    Take 2 tablets by mouth daily as needed.   FAMOTIDINE (PEPCID AC) 10 MG CHEWABLE TABLET    Chew 10 mg by  mouth at bedtime as needed for heartburn.   HYDROCORTISONE CREAM 1 %    Apply 1 application topically daily as needed for itching.    METOPROLOL SUCCINATE (TOPROL-XL) 25 MG 24 HR TABLET    TAKE 1 TABLET ONCE DAILY TO REGULATE HEART AND CONTROL BLOOD PRESSURE.   MULTIPLE VITAMINS-MINERALS (PRESERVISION AREDS 2 PO)    Take 1 capsule by mouth 2 (two) times daily.    POLYETHYL GLYCOL-PROPYL GLYCOL 0.4-0.3 % SOLN    Place 1 vial into both eyes 2 (two) times daily. May use another dose during the day as needed for dry eye   PSYLLIUM (METAMUCIL) 58.6 % PACKET    Take 1 packet by mouth daily. Mix with water   VITAMIN C (ASCORBIC ACID) 500 MG TABLET    Take 500 mg by mouth daily with supper.   Modified Medications  No medications on file  Discontinued Medications   No medications on file    Physical Exam:  There were no vitals filed for this visit. There is no height or weight on file to calculate BMI. Wt Readings from Last 3 Encounters:  11/24/20 127 lb 9.6 oz (57.9 kg)  10/19/20 128 lb (58.1 kg)  09/14/20 129 lb (58.5 kg)    Physical Exam Vitals and nursing note reviewed.  Constitutional:      Appearance: Normal appearance.  HENT:     Head: Normocephalic.  Cardiovascular:     Rate and Rhythm: Normal rate and regular rhythm.  Pulmonary:     Effort: Pulmonary effort is normal.     Breath sounds: Normal breath sounds.  Abdominal:     General: Abdomen is flat. Bowel sounds are normal.  Musculoskeletal:        General: Normal range of motion.  Neurological:     General: No focal deficit present.     Mental Status: She is alert and oriented to person, place, and time.  Psychiatric:        Mood and Affect: Mood normal.        Thought Content: Thought content normal.        Judgment: Judgment normal.    Labs reviewed: Basic Metabolic Panel: Recent Labs    06/09/20 0826 08/08/20 2007  NA 139 137  K 4.0 4.3  CL 105 102  CO2 28 24  GLUCOSE 84 106*  BUN 27* 27*  CREATININE  0.70 0.70  CALCIUM 9.4 9.2   Liver Function Tests: Recent Labs    06/09/20 0826 08/08/20 2007  AST 17 19  ALT 11 13  ALKPHOS  --  44  BILITOT 0.5 0.6  PROT 6.0* 6.4*  ALBUMIN  --  4.1   No results for input(s): LIPASE, AMYLASE in the last 8760 hours. No results for input(s): AMMONIA in the last 8760 hours. CBC: Recent Labs    06/09/20 0826 08/08/20 2007 11/12/20 1103  WBC 6.6 12.7* 6.9  NEUTROABS 3,082 10.6* 3,823  HGB 12.3 12.7 11.7  HCT 36.9 38.7 36.0  MCV 91.3 92.8 92.8  PLT 207 183 257   Lipid Panel: No results for input(s): CHOL, HDL, LDLCALC, TRIG, CHOLHDL, LDLDIRECT in the last 8760 hours. TSH: No results for input(s): TSH in the last 8760 hours. A1C: Lab Results  Component Value Date   HGBA1C 5.3 05/02/2013     Assessment/Plan 1. Essential hypertension Blood pressure good at 128/70 on metoprolol.  Continue same    Alain Honey, MD [facility] [phone]

## 2020-12-29 ENCOUNTER — Ambulatory Visit: Payer: Medicare Other | Admitting: Family Medicine

## 2021-01-10 DIAGNOSIS — H353211 Exudative age-related macular degeneration, right eye, with active choroidal neovascularization: Secondary | ICD-10-CM | POA: Diagnosis not present

## 2021-01-27 ENCOUNTER — Other Ambulatory Visit: Payer: Self-pay | Admitting: *Deleted

## 2021-01-27 DIAGNOSIS — I10 Essential (primary) hypertension: Secondary | ICD-10-CM

## 2021-01-27 MED ORDER — METOPROLOL SUCCINATE ER 25 MG PO TB24: ORAL_TABLET | ORAL | 1 refills | Status: DC

## 2021-01-27 NOTE — Telephone Encounter (Signed)
[facility]  

## 2021-02-02 DIAGNOSIS — Z23 Encounter for immunization: Secondary | ICD-10-CM | POA: Diagnosis not present

## 2021-02-14 DIAGNOSIS — H353211 Exudative age-related macular degeneration, right eye, with active choroidal neovascularization: Secondary | ICD-10-CM | POA: Diagnosis not present

## 2021-02-17 DIAGNOSIS — Z23 Encounter for immunization: Secondary | ICD-10-CM | POA: Diagnosis not present

## 2021-03-14 DIAGNOSIS — H353211 Exudative age-related macular degeneration, right eye, with active choroidal neovascularization: Secondary | ICD-10-CM | POA: Diagnosis not present

## 2021-03-29 ENCOUNTER — Other Ambulatory Visit: Payer: Self-pay | Admitting: *Deleted

## 2021-03-29 DIAGNOSIS — G459 Transient cerebral ischemic attack, unspecified: Secondary | ICD-10-CM

## 2021-03-29 MED ORDER — CLOPIDOGREL BISULFATE 75 MG PO TABS: ORAL_TABLET | ORAL | 1 refills | Status: DC

## 2021-03-29 NOTE — Telephone Encounter (Signed)
Pharmacy requested refill

## 2021-04-12 DIAGNOSIS — H353211 Exudative age-related macular degeneration, right eye, with active choroidal neovascularization: Secondary | ICD-10-CM | POA: Diagnosis not present

## 2021-04-20 DIAGNOSIS — H353133 Nonexudative age-related macular degeneration, bilateral, advanced atrophic without subfoveal involvement: Secondary | ICD-10-CM | POA: Diagnosis not present

## 2021-04-20 DIAGNOSIS — D23112 Other benign neoplasm of skin of right lower eyelid, including canthus: Secondary | ICD-10-CM | POA: Diagnosis not present

## 2021-04-20 DIAGNOSIS — H401131 Primary open-angle glaucoma, bilateral, mild stage: Secondary | ICD-10-CM | POA: Diagnosis not present

## 2021-04-20 DIAGNOSIS — H524 Presbyopia: Secondary | ICD-10-CM | POA: Diagnosis not present

## 2021-04-26 ENCOUNTER — Other Ambulatory Visit: Payer: Self-pay

## 2021-04-26 ENCOUNTER — Encounter: Payer: Self-pay | Admitting: Family Medicine

## 2021-04-26 ENCOUNTER — Ambulatory Visit (INDEPENDENT_AMBULATORY_CARE_PROVIDER_SITE_OTHER): Payer: Medicare Other | Admitting: Family Medicine

## 2021-04-26 VITALS — BP 132/80 | HR 70 | Temp 97.5°F | Ht 60.0 in | Wt 128.4 lb

## 2021-04-26 DIAGNOSIS — R609 Edema, unspecified: Secondary | ICD-10-CM

## 2021-04-26 DIAGNOSIS — K4090 Unilateral inguinal hernia, without obstruction or gangrene, not specified as recurrent: Secondary | ICD-10-CM | POA: Diagnosis not present

## 2021-04-26 DIAGNOSIS — I10 Essential (primary) hypertension: Secondary | ICD-10-CM

## 2021-04-26 NOTE — Progress Notes (Signed)
Provider:  Alain Honey, MD  Careteam: Patient Care Team: Wardell Honour, MD as PCP - General (Family Medicine) Lorretta Harp, MD as PCP - Cardiology (Cardiology) Laurence Spates, MD (Inactive) as Consulting Physician (Gastroenterology) Myrlene Broker, MD as Attending Physician (Urology) Lorretta Harp, MD as Consulting Physician (Cardiology) Rozetta Nunnery, MD as Consulting Physician (Otolaryngology) Richarda Osmond, DDS (Dentistry) Shon Hough, MD as Consulting Physician (Ophthalmology) Iran Planas, MD as Consulting Physician (Orthopedic Surgery) Greer Pickerel, MD as Consulting Physician (General Surgery)  [name]:  [place]  Advanced Directive information    Allergies  Allergen Reactions   Trimethoprim Other (See Comments)    Headaches/ ear pressure    Erythromycin Other (See Comments)    UNSPECIFIED REACTION    Macrodantin [Nitrofurantoin Macrocrystal] Other (See Comments)    UNSPECIFIED REACTION    Penicillins Other (See Comments)    UNSPECIFIED REACTION  Has patient had a PCN reaction causing immediate rash, facial/tongue/throat swelling, SOB or lightheadedness with hypotension: Unknown Has patient had a PCN reaction causing severe rash involving mucus membranes or skin necrosis: Unknown Has patient had a PCN reaction that required hospitalization: No Has patient had a PCN reaction occurring within the last 10 years: No If all of the above answers are "NO", then may proceed with Cephalosporin use.   Sulfa Antibiotics Other (See Comments)    UNSPECIFIED REACTION    Latex Itching    Chief Complaint  Patient presents with   Medical Management of Chronic Issues    Patient presents today for a 4 month follow-up.   Quality Metric Gaps    TDAP and COVID booster     HPI: Patient is a 87 y.o. female follow-up visit for this nice 43 year old resident at friend's home.  She presents today with her daughter who usually  accompanies her.  She has no new complaints.  She does talk about hernia repair and feels like there is some swelling in the area it was repaired by mesh and she wears a girdle outside.  She does not have to strain with constipation she and she does know heavy lifting or straining otherwise.  Planning to take trip to the beach with her daughter at Christmas and recently took a trip to [place] to see her son  Review of Systems:  Review of Systems  Constitutional: Negative.   Respiratory: Negative.    Cardiovascular: Negative.   Genitourinary: Negative.   Musculoskeletal: Negative.   Psychiatric/Behavioral: Negative.    All other systems reviewed and are negative.  Past Medical History:  Diagnosis Date   Cervicalgia    Chest pain at rest 01/07/2012   Displacement of cervical intervertebral disc without myelopathy    Diverticulosis of colon (without mention of hemorrhage)    Female stress incontinence    GERD (gastroesophageal reflux disease)    no peds occasional pepcid   Inguinal hernia without mention of obstruction or gangrene, unilateral or unspecified, (not specified as recurrent)    Internal hemorrhoids without mention of complication    Lumbago    Macular degeneration (senile) of retina, unspecified    Osteoarthrosis, unspecified whether generalized or localized, unspecified site    Other and unspecified hyperlipidemia    Pain in joint, hand    Palpitations    Reflux esophagitis    Scoliosis (and kyphoscoliosis), idiopathic    Senile osteoporosis    Skin disorder    Thyroid disease    TIA (transient ischemic attack)  Unruptured popliteal cyst 06/24/2014   Right knee    Unspecified essential hypertension    Unspecified glaucoma(365.9)    Unspecified hypothyroidism    Unspecified vitamin D deficiency    Past Surgical History:  Procedure Laterality Date   ABDOMINAL HYSTERECTOMY  1975   Dr Mallie Mussel   APPENDECTOMY  1988   cardiolite myocardial perfusion study      DOPPLER ECHOCARDIOGRAPHY     EYE SURGERY Bilateral 2009   cataract removed right eye, Dr Charise Killian   FOOT SURGERY  august 2013   Hewitt, MD   INGUINAL HERNIA REPAIR Bilateral    w/mesh   INGUINAL HERNIA REPAIR Left 08/03/2017   Procedure: LAPAROSCOPIC LEFT INGUINAL HERNIA REPAIR WITH MESH;  Surgeon: Greer Pickerel, MD;  Location: [facility];  Service: General;  Laterality: Left;   INGUINAL HERNIA REPAIR Right 08/03/2017   Procedure: HERNIA REPAIR RIGHT INGUINAL ADULT WITH MESH;  Surgeon: Greer Pickerel, MD;  Location: [facility];  Service: General;  Laterality: Right;   INGUINAL HERNIA REPAIR     Dr. Redmond Pulling 04-24-18   INGUINAL HERNIA REPAIR Right 04/24/2018   Procedure: DIAGNOSTIC LAPAROSCOPIC, OPENREPAIR OF RECURRENT RIGHT INGUINAL HERNIA WITH MESH ERAS PATHWAY;  Surgeon: Greer Pickerel, MD;  Location: [facility];  Service: General;  Laterality: Right;   INSERTION OF MESH Bilateral 08/03/2017   Procedure: INSERTION OF MESH;  Surgeon: Greer Pickerel, MD;  Location: [facility];  Service: General;  Laterality: Bilateral;   NM MYOVIEW LTD     negative   TONSILLECTOMY  1937   TOOTH EXTRACTION  09/16/13   Dr Carlos American   Social History:   reports that she has never smoked. She has never used smokeless tobacco. She reports that she does not drink alcohol and does not use drugs.  Family History  Problem Relation Age of Onset   CAD Mother    Parkinson's disease Mother    Cancer Father        colon cancer   Parkinson's disease Father    Stroke Maternal Grandmother     Medications: Patient's Medications  New Prescriptions   No medications on file  Previous Medications   ACETAMINOPHEN (TYLENOL) 500 MG TABLET    Take 500 mg by mouth 2 (two) times daily as needed for mild pain or headache.    BIMATOPROST (LUMIGAN) 0.01 % SOLN    Place 1 drop into both eyes at bedtime.   CALCIUM CARBONATE-VITAMIN D 600-400 MG-UNIT CHEW TABLET    Chew 1 tablet by mouth 2 (two) times daily.    CAMPHOR-MENTHOL (SARNA) LOTION    Apply 1  application topically as needed for itching. Prescribed by Dermatology at [facility] (VITAMIN D3) 50 MCG (2000 UT) TABS    Take by mouth.   CLOPIDOGREL (PLAVIX) 75 MG TABLET    1 tablet daily   CRANBERRY EXTRACT PO    Take 2 tablets by mouth daily as needed.   FAMOTIDINE (PEPCID AC) 10 MG CHEWABLE TABLET    Chew 10 mg by mouth at bedtime as needed for heartburn.   HYDROCORTISONE CREAM 1 %    Apply 1 application topically daily as needed for itching.    METOPROLOL SUCCINATE (TOPROL-XL) 25 MG 24 HR TABLET    Take one tablet by mouth once daily to regulate heart and control blood pressure.   MULTIPLE VITAMINS-MINERALS (PRESERVISION AREDS 2 PO)    Take 1 capsule by mouth 2 (two) times daily.    POLYETHYL GLYCOL-PROPYL GLYCOL 0.4-0.3 % SOLN  Place 1 vial into both eyes 2 (two) times daily. May use another dose during the day as needed for dry eye   PSYLLIUM (METAMUCIL) 58.6 % PACKET    Take 1 packet by mouth daily. Mix with water   VITAMIN C (ASCORBIC ACID) 500 MG TABLET    Take 500 mg by mouth daily with supper.   Modified Medications   No medications on file  Discontinued Medications   No medications on file    Physical Exam:  There were no vitals filed for this visit. There is no height or weight on file to calculate BMI. Wt Readings from Last 3 Encounters:  12/28/20 127 lb 3.2 oz (57.7 kg)  11/24/20 127 lb 9.6 oz (57.9 kg)  10/19/20 128 lb (58.1 kg)    Physical Exam Vitals and nursing note reviewed.  Constitutional:      Appearance: Normal appearance.  Cardiovascular:     Rate and Rhythm: Normal rate and regular rhythm.  Pulmonary:     Effort: Pulmonary effort is normal.     Breath sounds: Normal breath sounds.  Abdominal:     General: Abdomen is flat. Bowel sounds are normal.  Neurological:     General: No focal deficit present.     Mental Status: She is alert and oriented to person, place, and time.  Psychiatric:        Mood and Affect: Mood normal.         Behavior: Behavior normal.    Labs reviewed: Basic Metabolic Panel: Recent Labs    06/09/20 0826 08/08/20 2007  NA 139 137  K 4.0 4.3  CL 105 102  CO2 28 24  GLUCOSE 84 106*  BUN 27* 27*  CREATININE 0.70 0.70  CALCIUM 9.4 9.2   Liver Function Tests: Recent Labs    06/09/20 0826 08/08/20 2007  AST 17 19  ALT 11 13  ALKPHOS  --  44  BILITOT 0.5 0.6  PROT 6.0* 6.4*  ALBUMIN  --  4.1   No results for input(s): LIPASE, AMYLASE in the last 8760 hours. No results for input(s): AMMONIA in the last 8760 hours. CBC: Recent Labs    06/09/20 0826 08/08/20 2007 11/12/20 1103  WBC 6.6 12.7* 6.9  NEUTROABS 3,082 10.6* 3,823  HGB 12.3 12.7 11.7  HCT 36.9 38.7 36.0  MCV 91.3 92.8 92.8  PLT 207 183 257   Lipid Panel: No results for input(s): CHOL, HDL, LDLCALC, TRIG, CHOLHDL, LDLDIRECT in the last 8760 hours. TSH: No results for input(s): TSH in the last 8760 hours. A1C: Lab Results  Component Value Date   HGBA1C 5.3 05/02/2013     Assessment/Plan  1. Edema, unspecified type Edema is not a problem any longer.  She still is in the habit of weighing herself twice a day but weight has been stable for a long time.  I told her she does not have to continue weighing daily.  2. Unilateral inguinal hernia without obstruction or gangrene, recurrence not specified Hernia has been repaired by mesh.  I see no evidence of recurrent    3. Essential hypertension Blood pressure is good at 132/80.  Continue on metoprolol Plan to see her back in 3 to 4 months at which time we will do some lab work  Alain Honey, MD [place] [phone]

## 2021-05-25 DIAGNOSIS — H353211 Exudative age-related macular degeneration, right eye, with active choroidal neovascularization: Secondary | ICD-10-CM | POA: Diagnosis not present

## 2021-05-27 ENCOUNTER — Ambulatory Visit (INDEPENDENT_AMBULATORY_CARE_PROVIDER_SITE_OTHER): Payer: Medicare Other | Admitting: Cardiovascular Disease

## 2021-05-27 ENCOUNTER — Encounter: Payer: Self-pay | Admitting: Cardiovascular Disease

## 2021-05-27 ENCOUNTER — Other Ambulatory Visit: Payer: Self-pay

## 2021-05-27 VITALS — BP 134/60 | HR 68 | Ht 60.0 in | Wt 129.4 lb

## 2021-05-27 DIAGNOSIS — E782 Mixed hyperlipidemia: Secondary | ICD-10-CM | POA: Diagnosis not present

## 2021-05-27 DIAGNOSIS — I10 Essential (primary) hypertension: Secondary | ICD-10-CM

## 2021-05-27 NOTE — Patient Instructions (Signed)

## 2021-05-27 NOTE — Assessment & Plan Note (Signed)
History of essential hypertension blood pressure measured today at 134/60.  She is on metoprolol.

## 2021-05-27 NOTE — Assessment & Plan Note (Signed)
History of hyperlipidemia not on statin therapy with lipid profile performed 07/10/2019 revealing a total cholesterol of 188, LDL of 94 and HDL of 75.

## 2021-05-27 NOTE — Progress Notes (Signed)
05/27/2021 [place]   [birth year]  [id number]  Primary Physician Wardell Honour, MD Primary Cardiologist: Lorretta Harp MD FACP, [place], [place], [place]  HPI:  [name] is a 88 y.o.  thin appearing widowed Caucasian female mother of 2, grandmother of 2 grandchildren who I last saw in the office 04/20/2020.  She is accompanied by her daughter [name] today.  Her only symptoms at that time were palpitations on a low-dose beta blocker. She also had a history of GERD. She was admitted to [facility] on January 06, 2013 with chest pain. She ruled out for myocardial infarction. She had a Myoview stress test which was normal. She had no recurrent symptoms.she also had TIA type symptoms last year and was diagnosed with a small "cerebral aneurysm "conservative medical therapy was recommended. She follows up with a neurologist and has been asymptomatic. She does have left shoulder issues and apparently since seen Dr. Onnie Graham was recommended total shoulder replacement. She was in [place]  2 years ago at her granddaughter's wedding and wishes to return.   Since I saw her a year ago she is remained stable.  She lives at [facility] in an apartment/independent living.  She denies chest pain or shortness of breath.  She does work out with a Community education officer.     Current Meds  Medication Sig   acetaminophen (TYLENOL) 500 MG tablet Take 500 mg by mouth 2 (two) times daily as needed for mild pain or headache.    bimatoprost (LUMIGAN) 0.01 % SOLN Place 1 drop into both eyes at bedtime.   Calcium Carbonate-Vitamin D 600-400 MG-UNIT chew tablet Chew 1 tablet by mouth 2 (two) times daily.    camphor-menthol (SARNA) lotion Apply 1 application topically as needed for itching. Prescribed by Dermatology at [facility]   Cholecalciferol (VITAMIN D3) 50 MCG (2000 UT) TABS Take by mouth.   clopidogrel (PLAVIX) 75 MG tablet 1 tablet daily   CRANBERRY EXTRACT PO Take 2 tablets by mouth daily  as needed.   famotidine (PEPCID AC) 10 MG chewable tablet Chew 10 mg by mouth at bedtime as needed for heartburn.   hydrocortisone cream 1 % Apply 1 application topically daily as needed for itching.    metoprolol succinate (TOPROL-XL) 25 MG 24 hr tablet Take one tablet by mouth once daily to regulate heart and control blood pressure.   Multiple Vitamins-Minerals (PRESERVISION AREDS 2 PO) Take 1 capsule by mouth 2 (two) times daily.    Polyethyl Glycol-Propyl Glycol 0.4-0.3 % SOLN Place 1 vial into both eyes 2 (two) times daily. May use another dose during the day as needed for dry eye   psyllium (METAMUCIL) 58.6 % packet Take 1 packet by mouth daily. Mix with water   vitamin C (ASCORBIC ACID) 500 MG tablet Take 500 mg by mouth daily with supper.      Allergies  Allergen Reactions   Trimethoprim Other (See Comments)    Headaches/ ear pressure    Erythromycin Other (See Comments)    UNSPECIFIED REACTION    Macrodantin [Nitrofurantoin Macrocrystal] Other (See Comments)    UNSPECIFIED REACTION    Penicillins Other (See Comments)    UNSPECIFIED REACTION  Has patient had a PCN reaction causing immediate rash, facial/tongue/throat swelling, SOB or lightheadedness with hypotension: Unknown Has patient had a PCN reaction causing severe rash involving mucus membranes or skin necrosis: Unknown Has patient had a PCN reaction that required hospitalization: No Has patient had  a PCN reaction occurring within the last 10 years: No If all of the above answers are "NO", then may proceed with Cephalosporin use.   Sulfa Antibiotics Other (See Comments)    UNSPECIFIED REACTION    Latex Itching    Social History   Socioeconomic History   Marital status: Widowed    Spouse name: Not on file   Number of children: 2   Years of education: college   Highest education level: Not on file  Occupational History    Comment: retired  Tobacco Use   Smoking status: Never   Smokeless tobacco: Never  Vaping  Use   Vaping Use: Never used  Substance and Sexual Activity   Alcohol use: No   Drug use: No   Sexual activity: Not Currently  Other Topics Concern   Not on file  Social History Narrative   Patient lives at home with her daughter [name]) , moved to [facility] 02/02/16 [facility]    Widowed.   Retired.   EducationNurse, mental health.   Right handed.   Caffeine- None some times tea very rare.   Never smoked   Alcohol none   Social Determinants of Health   Financial Resource Strain: Not on file  Food Insecurity: Not on file  Transportation Needs: Not on file  Physical Activity: Not on file  Stress: Not on file  Social Connections: Not on file  Intimate Partner Violence: Not on file     Review of Systems: General: negative for chills, fever, night sweats or weight changes.  Cardiovascular: negative for chest pain, dyspnea on exertion, edema, orthopnea, palpitations, paroxysmal nocturnal dyspnea or shortness of breath Dermatological: negative for rash Respiratory: negative for cough or wheezing Urologic: negative for hematuria Abdominal: negative for nausea, vomiting, diarrhea, bright red blood per rectum, melena, or hematemesis Neurologic: negative for visual changes, syncope, or dizziness All other systems reviewed and are otherwise negative except as noted above.    Blood pressure 134/60, pulse 68, height 5' (1.524 m), weight 129 lb 6.4 oz (58.7 kg), SpO2 95 %.  General appearance: alert and no distress Neck: no adenopathy, no carotid bruit, no JVD, supple, symmetrical, trachea midline, and thyroid not enlarged, symmetric, no tenderness/mass/nodules Lungs: clear to auscultation bilaterally Heart: regular rate and rhythm, S1, S2 normal, no murmur, click, rub or gallop Extremities: extremities normal, atraumatic, no cyanosis or edema Pulses: 2+ and symmetric Skin: Skin color, texture, turgor normal. No rashes or lesions Neurologic: Grossly normal  EKG sinus rhythm at 68 with  voltage criteria for LVH.  I personally reviewed this EKG.  ASSESSMENT AND PLAN:   Essential hypertension History of essential hypertension blood pressure measured today at 134/60.  She is on metoprolol.  Hyperlipidemia History of hyperlipidemia not on statin therapy with lipid profile performed 07/10/2019 revealing a total cholesterol of 188, LDL of 94 and HDL of 75.     Lorretta Harp MD FACP,FACC,FAHA, [facility] 05/27/2021 11:22 AM

## 2021-06-23 DIAGNOSIS — M6281 Muscle weakness (generalized): Secondary | ICD-10-CM | POA: Diagnosis not present

## 2021-06-23 DIAGNOSIS — R2689 Other abnormalities of gait and mobility: Secondary | ICD-10-CM | POA: Diagnosis not present

## 2021-06-23 DIAGNOSIS — Z9181 History of falling: Secondary | ICD-10-CM | POA: Diagnosis not present

## 2021-06-23 DIAGNOSIS — R2681 Unsteadiness on feet: Secondary | ICD-10-CM | POA: Diagnosis not present

## 2021-06-28 ENCOUNTER — Encounter: Payer: Self-pay | Admitting: Family Medicine

## 2021-06-28 ENCOUNTER — Other Ambulatory Visit: Payer: Self-pay

## 2021-06-28 ENCOUNTER — Ambulatory Visit (INDEPENDENT_AMBULATORY_CARE_PROVIDER_SITE_OTHER): Payer: Medicare Other | Admitting: Family Medicine

## 2021-06-28 ENCOUNTER — Telehealth: Payer: Self-pay | Admitting: *Deleted

## 2021-06-28 VITALS — BP 122/70 | HR 76 | Temp 96.8°F | Ht 60.0 in | Wt 129.4 lb

## 2021-06-28 DIAGNOSIS — R2689 Other abnormalities of gait and mobility: Secondary | ICD-10-CM | POA: Diagnosis not present

## 2021-06-28 DIAGNOSIS — R2681 Unsteadiness on feet: Secondary | ICD-10-CM | POA: Diagnosis not present

## 2021-06-28 DIAGNOSIS — Z9181 History of falling: Secondary | ICD-10-CM | POA: Diagnosis not present

## 2021-06-28 DIAGNOSIS — R300 Dysuria: Secondary | ICD-10-CM

## 2021-06-28 DIAGNOSIS — M6281 Muscle weakness (generalized): Secondary | ICD-10-CM | POA: Diagnosis not present

## 2021-06-28 LAB — POCT URINALYSIS DIPSTICK
Bilirubin, UA: NEGATIVE
Blood, UA: NEGATIVE
Glucose, UA: NEGATIVE
Ketones, UA: NEGATIVE
Nitrite, UA: NEGATIVE
Protein, UA: NEGATIVE
Spec Grav, UA: 1.01 (ref 1.010–1.025)
Urobilinogen, UA: NEGATIVE E.U./dL — AB
pH, UA: 6 (ref 5.0–8.0)

## 2021-06-28 MED ORDER — CEFIXIME 100 MG/5ML PO SUSR
200.0000 mg | Freq: Every day | ORAL | 0 refills | Status: DC
Start: 2021-06-28 — End: 2021-07-06

## 2021-06-28 NOTE — Progress Notes (Signed)
Provider:  Alain Honey, MD  Careteam: Patient Care Team: Wardell Honour, MD as PCP - General (Family Medicine) Lorretta Harp, MD as PCP - Cardiology (Cardiology) Laurence Spates, MD (Inactive) as Consulting Physician (Gastroenterology) Myrlene Broker, MD as Attending Physician (Urology) Lorretta Harp, MD as Consulting Physician (Cardiology) Rozetta Nunnery, MD (Inactive) as Consulting Physician (Otolaryngology) Richarda Osmond, DDS (Dentistry) Shon Hough, MD as Consulting Physician (Ophthalmology) Iran Planas, MD as Consulting Physician (Orthopedic Surgery) Greer Pickerel, MD as Consulting Physician (General Surgery)  [name]:  [place]  Advanced Directive information    Allergies  Allergen Reactions   Trimethoprim Other (See Comments)    Headaches/ ear pressure    Erythromycin Other (See Comments)    UNSPECIFIED REACTION    Macrodantin [Nitrofurantoin Macrocrystal] Other (See Comments)    UNSPECIFIED REACTION    Penicillins Other (See Comments)    UNSPECIFIED REACTION  Has patient had a PCN reaction causing immediate rash, facial/tongue/throat swelling, SOB or lightheadedness with hypotension: Unknown Has patient had a PCN reaction causing severe rash involving mucus membranes or skin necrosis: Unknown Has patient had a PCN reaction that required hospitalization: No Has patient had a PCN reaction occurring within the last 10 years: No If all of the above answers are "NO", then may proceed with Cephalosporin use.   Sulfa Antibiotics Other (See Comments)    UNSPECIFIED REACTION    Latex Itching    Chief Complaint  Patient presents with   Acute Visit    Patient presents today for burning while urinating.     HPI: Patient is a 88 y.o. female No flank pain, fever or chills.  Past history of UTI.  Patient endorses not drinking much water lately.  Previous cultures have showed mostly E. coli but she has had reactions to many  of the common treatments such as quinolones penicillins sulfas etc.  Most recent symptoms were associated with culture growing less than 50,000 organisms.  Review of Systems:  Review of Systems  Constitutional:  Negative for chills and fever.  Gastrointestinal:  Negative for abdominal pain and nausea.  Genitourinary:  Positive for dysuria.  All other systems reviewed and are negative.  Past Medical History:  Diagnosis Date   Cervicalgia    Chest pain at rest 01/07/2012   Displacement of cervical intervertebral disc without myelopathy    Diverticulosis of colon (without mention of hemorrhage)    Female stress incontinence    GERD (gastroesophageal reflux disease)    no peds occasional pepcid   Inguinal hernia without mention of obstruction or gangrene, unilateral or unspecified, (not specified as recurrent)    Internal hemorrhoids without mention of complication    Lumbago    Macular degeneration (senile) of retina, unspecified    Osteoarthrosis, unspecified whether generalized or localized, unspecified site    Other and unspecified hyperlipidemia    Pain in joint, hand    Palpitations    Reflux esophagitis    Scoliosis (and kyphoscoliosis), idiopathic    Senile osteoporosis    Skin disorder    Thyroid disease    TIA (transient ischemic attack)    Unruptured popliteal cyst 06/24/2014   Right knee    Unspecified essential hypertension    Unspecified glaucoma(365.9)    Unspecified hypothyroidism    Unspecified vitamin D deficiency    Past Surgical History:  Procedure Laterality Date   ABDOMINAL HYSTERECTOMY  1975   Dr Mallie Mussel   APPENDECTOMY  1988   cardiolite  myocardial perfusion study     DOPPLER ECHOCARDIOGRAPHY     EYE SURGERY Bilateral 2009   cataract removed right eye, Dr Charise Killian   FOOT SURGERY  august 2013   Hewitt, MD   INGUINAL HERNIA REPAIR Bilateral    w/mesh   INGUINAL HERNIA REPAIR Left 08/03/2017   Procedure: LAPAROSCOPIC LEFT INGUINAL HERNIA REPAIR WITH MESH;   Surgeon: Greer Pickerel, MD;  Location: [facility];  Service: General;  Laterality: Left;   INGUINAL HERNIA REPAIR Right 08/03/2017   Procedure: HERNIA REPAIR RIGHT INGUINAL ADULT WITH MESH;  Surgeon: Greer Pickerel, MD;  Location: [facility];  Service: General;  Laterality: Right;   INGUINAL HERNIA REPAIR     Dr. Redmond Pulling 04-24-18   INGUINAL HERNIA REPAIR Right 04/24/2018   Procedure: DIAGNOSTIC LAPAROSCOPIC, OPENREPAIR OF RECURRENT RIGHT INGUINAL HERNIA WITH MESH ERAS PATHWAY;  Surgeon: Greer Pickerel, MD;  [facility];  Service: General;  Laterality: Right;   INSERTION OF MESH Bilateral 08/03/2017   Procedure: INSERTION OF MESH;  Surgeon: Greer Pickerel, MD;  Location: [facility];  Service: General;  Laterality: Bilateral;   NM MYOVIEW LTD     negative   TONSILLECTOMY  1937   TOOTH EXTRACTION  09/16/13   Dr Carlos American   Social History:   reports that she has never smoked. She has never used smokeless tobacco. She reports that she does not drink alcohol and does not use drugs.  Family History  Problem Relation Age of Onset   CAD Mother    Parkinson's disease Mother    Cancer Father        colon cancer   Parkinson's disease Father    Stroke Maternal Grandmother     Medications: Patient's Medications  New Prescriptions   No medications on file  Previous Medications   ACETAMINOPHEN (TYLENOL) 500 MG TABLET    Take 500 mg by mouth 2 (two) times daily as needed for mild pain or headache.    BIMATOPROST (LUMIGAN) 0.01 % SOLN    Place 1 drop into both eyes at bedtime.   CALCIUM CARBONATE-VITAMIN D 600-400 MG-UNIT CHEW TABLET    Chew 1 tablet by mouth 2 (two) times daily.    CAMPHOR-MENTHOL (SARNA) LOTION    Apply 1 application topically as needed for itching. Prescribed by Dermatology at [facility] (VITAMIN D3) 50 MCG (2000 UT) TABS    Take by mouth.   CLOPIDOGREL (PLAVIX) 75 MG TABLET    1 tablet daily   CRANBERRY EXTRACT PO    Take 2 tablets by mouth daily as needed.   FAMOTIDINE  (PEPCID AC) 10 MG CHEWABLE TABLET    Chew 10 mg by mouth at bedtime as needed for heartburn.   HYDROCORTISONE CREAM 1 %    Apply 1 application topically daily as needed for itching.    METOPROLOL SUCCINATE (TOPROL-XL) 25 MG 24 HR TABLET    Take one tablet by mouth once daily to regulate heart and control blood pressure.   MULTIPLE VITAMINS-MINERALS (PRESERVISION AREDS 2 PO)    Take 1 capsule by mouth 2 (two) times daily.    POLYETHYL GLYCOL-PROPYL GLYCOL 0.4-0.3 % SOLN    Place 1 vial into both eyes 2 (two) times daily. May use another dose during the day as needed for dry eye   PSYLLIUM (METAMUCIL) 58.6 % PACKET    Take 1 packet by mouth daily. Mix with water   VITAMIN C (ASCORBIC ACID) 500 MG TABLET    Take 500 mg by  mouth daily with supper.   Modified Medications   No medications on file  Discontinued Medications   No medications on file    Physical Exam:  There were no vitals filed for this visit. There is no height or weight on file to calculate BMI. Wt Readings from Last 3 Encounters:  05/27/21 129 lb 6.4 oz (58.7 kg)  04/26/21 128 lb 6.4 oz (58.2 kg)  12/28/20 127 lb 3.2 oz (57.7 kg)    Physical Exam Vitals and nursing note reviewed.  Constitutional:      Appearance: Normal appearance.  Genitourinary:    Comments: Urine is positive for leukocytes and will be cultured Neurological:     Mental Status: She is alert.    Labs reviewed: Basic Metabolic Panel: Recent Labs    08/08/20 2007  NA 137  K 4.3  CL 102  CO2 24  GLUCOSE 106*  BUN 27*  CREATININE 0.70  CALCIUM 9.2   Liver Function Tests: Recent Labs    08/08/20 2007  AST 19  ALT 13  ALKPHOS 44  BILITOT 0.6  PROT 6.4*  ALBUMIN 4.1   No results for input(s): LIPASE, AMYLASE in the last 8760 hours. No results for input(s): AMMONIA in the last 8760 hours. CBC: Recent Labs    08/08/20 2007 11/12/20 1103  WBC 12.7* 6.9  NEUTROABS 10.6* 3,823  HGB 12.7 11.7  HCT 38.7 36.0  MCV 92.8 92.8  PLT 183  257   Lipid Panel: No results for input(s): CHOL, HDL, LDLCALC, TRIG, CHOLHDL, LDLDIRECT in the last 8760 hours. TSH: No results for input(s): TSH in the last 8760 hours. A1C: Lab Results  Component Value Date   HGBA1C 5.3 05/02/2013     Assessment/Plan  1. Burning with urination Based on previous culture and sensitivities, will begin cefepime or Suprax 1 teaspoon/day Drink lots of water     Alain Honey, MD [facility] [phone]

## 2021-06-28 NOTE — Telephone Encounter (Signed)
Juliann Pulse with [facility] called and stated that they DO NOT have Cefixime Antibiotic and need something else called in.   Please Advise.   (Someone is at the pharmacy waiting to pick up)

## 2021-06-28 NOTE — Patient Instructions (Signed)
Drinks lots of water

## 2021-06-28 NOTE — Telephone Encounter (Signed)
Dr. Trinna Balloon and spoke with Pharmacist at [facility].

## 2021-06-29 DIAGNOSIS — H353211 Exudative age-related macular degeneration, right eye, with active choroidal neovascularization: Secondary | ICD-10-CM | POA: Diagnosis not present

## 2021-06-30 ENCOUNTER — Telehealth: Payer: Self-pay

## 2021-06-30 DIAGNOSIS — R2681 Unsteadiness on feet: Secondary | ICD-10-CM | POA: Diagnosis not present

## 2021-06-30 DIAGNOSIS — R2689 Other abnormalities of gait and mobility: Secondary | ICD-10-CM | POA: Diagnosis not present

## 2021-06-30 DIAGNOSIS — M6281 Muscle weakness (generalized): Secondary | ICD-10-CM | POA: Diagnosis not present

## 2021-06-30 DIAGNOSIS — Z9181 History of falling: Secondary | ICD-10-CM | POA: Diagnosis not present

## 2021-06-30 NOTE — Telephone Encounter (Signed)
Recommend over the counter AZO tablet while waiting for final urine culture and sensitivity in case we have to prescribe a different antibiotics

## 2021-06-30 NOTE — Telephone Encounter (Signed)
Called home number- call would not go through x 2  Called mobile number, no answer, and voicemail is full

## 2021-06-30 NOTE — Telephone Encounter (Signed)
Incoming call received from patient questioning results of urine culture and treatment going forward.  Patient states she was originally prescribed a liquid antibiotic, which was not available and Dr.Miller called in an alternative for 3 day supply. Patient is about to take her last pill today and feels she needs rx extended as she is still burning when urinating.   I asked patient the name, dose and frequency of alternative that Dr.Miller sent in as it is not documented.  Patient is taking Cefdinir 300 mg capsule twice daily  Side note: Urine Culture results are not back as they take 48-72 hours to result back

## 2021-06-30 NOTE — Telephone Encounter (Signed)
Patient returned call and I discussed Dinah's response. Patient verbalized understanding

## 2021-07-01 ENCOUNTER — Other Ambulatory Visit: Payer: Self-pay | Admitting: Family Medicine

## 2021-07-01 DIAGNOSIS — N3000 Acute cystitis without hematuria: Secondary | ICD-10-CM

## 2021-07-01 LAB — URINE CULTURE
MICRO NUMBER:: 13007458
SPECIMEN QUALITY:: ADEQUATE

## 2021-07-01 MED ORDER — CEFDINIR 300 MG PO CAPS: 300.0000 mg | ORAL_CAPSULE | Freq: Two times a day (BID) | ORAL | 0 refills | Status: DC

## 2021-07-05 DIAGNOSIS — Z9181 History of falling: Secondary | ICD-10-CM | POA: Diagnosis not present

## 2021-07-05 DIAGNOSIS — M6281 Muscle weakness (generalized): Secondary | ICD-10-CM | POA: Diagnosis not present

## 2021-07-05 DIAGNOSIS — R2689 Other abnormalities of gait and mobility: Secondary | ICD-10-CM | POA: Diagnosis not present

## 2021-07-05 DIAGNOSIS — R2681 Unsteadiness on feet: Secondary | ICD-10-CM | POA: Diagnosis not present

## 2021-07-06 ENCOUNTER — Encounter: Payer: Self-pay | Admitting: Family

## 2021-07-06 ENCOUNTER — Telehealth (INDEPENDENT_AMBULATORY_CARE_PROVIDER_SITE_OTHER): Payer: Medicare Other | Admitting: Family

## 2021-07-06 ENCOUNTER — Other Ambulatory Visit: Payer: Self-pay

## 2021-07-06 DIAGNOSIS — R400 Somnolence: Secondary | ICD-10-CM

## 2021-07-06 DIAGNOSIS — N3000 Acute cystitis without hematuria: Secondary | ICD-10-CM

## 2021-07-06 NOTE — Progress Notes (Signed)
This service is provided via telemedicine  No vital signs collected/recorded due to the encounter was a telemedicine visit.   Location of patient (ex: home, work):  Home  Patient consents to a telephone visit:  Yes  Location of the provider (ex: office, home):  [employer].  Name of any referring provider:  Wardell Honour, MD   Names of all persons participating in the telemedicine service and their role in the encounter:  Patient, Heriberto Antigua, [place], Aneta, [name], NP.    Time spent on call: 8 minutes spent on the phone with Medical Assistant.      Provider: Raydin Bielinski FNP-C  Wardell Honour, MD  Patient Care Team: Wardell Honour, MD as PCP - General (Family Medicine) Lorretta Harp, MD as PCP - Cardiology (Cardiology) Laurence Spates, MD (Inactive) as Consulting Physician (Gastroenterology) Myrlene Broker, MD as Attending Physician (Urology) Lorretta Harp, MD as Consulting Physician (Cardiology) Rozetta Nunnery, MD (Inactive) as Consulting Physician (Otolaryngology) Richarda Osmond, [place] (Dentistry) Shon Hough, MD as Consulting Physician (Ophthalmology) Iran Planas, MD as Consulting Physician (Orthopedic Surgery) Greer Pickerel, MD as Consulting Physician (General Surgery)  Extended Emergency Contact Information Primary Emergency Contact: [name] Address: [address]          [place], [place] [address] [name] of [place] Phone: [phone] Work Phone: [phone] Mobile Phone: [phone] Relation: Daughter Secondary Emergency Contact: Arvin Collard Address: [address] [place] [address] [name] of [place] Phone: [phone] Mobile Phone: [phone] Relation: Son  Code Status:  DNR Goals of care: Advanced Directive information Advanced Directives 07/06/2021  Does Patient Have a Medical Advance Directive? Yes  Type of Paramedic of  [place];Living will;Out of facility DNR (pink MOST or yellow form)  Does patient want to make changes to medical advance directive? No - Patient declined  Copy of [place] in Chart? Yes - validated most recent copy scanned in chart (See row information)  Would patient like information on creating a medical advance directive? -  Pre-existing out of facility DNR order (yellow form or pink MOST form) -     Chief Complaint  Patient presents with   Follow-up    Patient states that she took last antibiotic Cefepime last night. Patient states she no longer has UTI symptoms. Patient complains of falling asleep at Yoga class this morning and being woke by instructor.     HPI:  Pt is a 88 y.o. female seen today for an acute visit for evaluation of sleepiness.states  When she woke up her gown was sweaty.states the instructed had to wake her up twice in the class.States slept well last night does not understand why she was sleepy in the class.No LOC or weakness.Had a good breakfast.she denies any She denies any fever,chills,cough,fatigue,body aches,runny nose,chest tightness,chest pain,palpitation or shortness of breath.   States feeling better right now just wanted to notify provider's office what happed She took her last antibiotics Cefdinir today for recent UTI.She denies any fever,chills,nausea,vomiting,abdominal pain,flank pain,urgency,frequency,dysuria,difficult urination or hematuria.   Past Medical History:  Diagnosis Date   Cervicalgia    Chest pain at rest 01/07/2012   Displacement of cervical intervertebral disc without myelopathy    Diverticulosis of colon (without mention of hemorrhage)    Female stress incontinence    GERD (gastroesophageal reflux disease)    no peds occasional pepcid   Inguinal hernia without mention  of obstruction or gangrene, unilateral or unspecified, (not specified as recurrent)    Internal hemorrhoids without mention of complication     Lumbago    Macular degeneration (senile) of retina, unspecified    Osteoarthrosis, unspecified whether generalized or localized, unspecified site    Other and unspecified hyperlipidemia    Pain in joint, hand    Palpitations    Reflux esophagitis    Scoliosis (and kyphoscoliosis), idiopathic    Senile osteoporosis    Skin disorder    Thyroid disease    TIA (transient ischemic attack)    Unruptured popliteal cyst 06/24/2014   Right knee    Unspecified essential hypertension    Unspecified glaucoma(365.9)    Unspecified hypothyroidism    Unspecified vitamin D deficiency    Past Surgical History:  Procedure Laterality Date   ABDOMINAL HYSTERECTOMY  1975   Dr Mallie Mussel   APPENDECTOMY  1988   cardiolite myocardial perfusion study     DOPPLER [place] Bilateral 2009   cataract removed right eye, Dr Charise Killian   FOOT SURGERY  august 2013   Hewitt, MD   INGUINAL HERNIA REPAIR Bilateral    w/mesh   INGUINAL HERNIA REPAIR Left 08/03/2017   Procedure: LAPAROSCOPIC LEFT INGUINAL HERNIA REPAIR WITH MESH;  Surgeon: Greer Pickerel, MD;  Location: [facility];  Service: General;  Laterality: Left;   INGUINAL HERNIA REPAIR Right 08/03/2017   Procedure: HERNIA REPAIR RIGHT INGUINAL ADULT WITH MESH;  Surgeon: Greer Pickerel, MD;  Location: [facility];  Service: General;  Laterality: Right;   INGUINAL HERNIA REPAIR     Dr. Redmond Pulling 04-24-18   INGUINAL HERNIA REPAIR Right 04/24/2018   Procedure: DIAGNOSTIC LAPAROSCOPIC, OPENREPAIR OF RECURRENT RIGHT INGUINAL HERNIA WITH MESH ERAS PATHWAY;  Surgeon: Greer Pickerel, MD;  Location: [facility];  Service: General;  Laterality: Right;   INSERTION OF MESH Bilateral 08/03/2017   Procedure: INSERTION OF MESH;  Surgeon: Greer Pickerel, MD;  Location: [facility];  Service: General;  Laterality: Bilateral;   NM MYOVIEW LTD     negative   TONSILLECTOMY  1937   TOOTH EXTRACTION  09/16/13   Dr Carlos American    Allergies  Allergen Reactions   Trimethoprim Other (See Comments)     Headaches/ ear pressure    Erythromycin Other (See Comments)    UNSPECIFIED REACTION    Macrodantin [Nitrofurantoin Macrocrystal] Other (See Comments)    UNSPECIFIED REACTION    Penicillins Other (See Comments)    UNSPECIFIED REACTION  Has patient had a PCN reaction causing immediate rash, facial/tongue/throat swelling, SOB or lightheadedness with hypotension: Unknown Has patient had a PCN reaction causing severe rash involving mucus membranes or skin necrosis: Unknown Has patient had a PCN reaction that required hospitalization: No Has patient had a PCN reaction occurring within the last 10 years: No If all of the above answers are "NO", then may proceed with Cephalosporin use.   Sulfa Antibiotics Other (See Comments)    UNSPECIFIED REACTION    Latex Itching    Outpatient Encounter Medications as of 07/06/2021  Medication Sig   acetaminophen (TYLENOL) 500 MG tablet Take 500 mg by mouth 2 (two) times daily as needed for mild pain or headache.    bimatoprost (LUMIGAN) 0.01 % SOLN Place 1 drop into both eyes at bedtime.   Calcium Carbonate-Vitamin D 600-400 MG-UNIT chew tablet Chew 1 tablet by mouth 2 (two) times daily.    camphor-menthol (SARNA) lotion Apply 1 application topically as needed for itching.  Prescribed by Dermatology at [facility]   cefdinir (OMNICEF) 300 MG capsule Take 1 capsule (300 mg total) by mouth 2 (two) times daily.   Cholecalciferol (VITAMIN D3) 50 MCG (2000 UT) TABS Take by mouth.   clopidogrel (PLAVIX) 75 MG tablet 1 tablet daily   CRANBERRY EXTRACT PO Take 2 tablets by mouth daily as needed.   famotidine (PEPCID AC) 10 MG chewable tablet Chew 10 mg by mouth at bedtime as needed for heartburn.   hydrocortisone cream 1 % Apply 1 application topically daily as needed for itching.    metoprolol succinate (TOPROL-XL) 25 MG 24 hr tablet Take one tablet by mouth once daily to regulate heart and control blood pressure.   Multiple Vitamins-Minerals (PRESERVISION  AREDS 2 PO) Take 1 capsule by mouth 2 (two) times daily.    Polyethyl Glycol-Propyl Glycol 0.4-0.3 % SOLN Place 1 vial into both eyes 2 (two) times daily. May use another dose during the day as needed for dry eye   psyllium (METAMUCIL) 58.6 % packet Take 1 packet by mouth daily. Mix with water   vitamin C (ASCORBIC ACID) 500 MG tablet Take 500 mg by mouth daily with supper.    [DISCONTINUED] cefixime (SUPRAX) 100 MG/5ML suspension Take 10 mLs (200 mg total) by mouth daily.   No facility-administered encounter medications on file as of 07/06/2021.    Review of Systems  Constitutional:  Negative for appetite change, chills, fatigue, fever and unexpected weight change.  HENT:  Negative for congestion, rhinorrhea, sinus pressure, sinus pain, sneezing, sore throat, tinnitus and trouble swallowing.   Eyes:  Negative for pain, discharge, redness and itching.  Respiratory:  Negative for cough, chest tightness, shortness of breath and wheezing.   Cardiovascular:  Negative for chest pain, palpitations and leg swelling.  Gastrointestinal:  Negative for abdominal distention, abdominal pain, blood in stool, constipation, diarrhea, nausea and vomiting.  Endocrine: Negative for polydipsia, polyphagia and polyuria.  Genitourinary:  Negative for difficulty urinating, dysuria, flank pain, frequency and urgency.  Skin:  Negative for color change, pallor and rash.  Neurological:  Negative for dizziness, syncope, speech difficulty, weakness, light-headedness, numbness and headaches.  Hematological:  Does not bruise/bleed easily.  Psychiatric/Behavioral:  Negative for agitation, behavioral problems, confusion, hallucinations and sleep disturbance. The patient is not nervous/anxious.        Reports sleepiness during Yoga class this morning   Immunization History  Administered Date(s) Administered   Fluad Quad(high Dose 65+) 02/04/2019   Influenza Whole 02/24/2011, 02/12/2012   Influenza, High Dose Seasonal PF  02/01/2017, 02/25/2018, 02/11/2020   Influenza,inj,Quad PF,6+ Mos 02/20/2013, 02/10/2014, 02/19/2015, 02/18/2016   Influenza-Unspecified 02/16/2021   Moderna Sars-Covid-2 Vaccination 05/19/2019, 06/16/2019, 03/29/2020, 09/27/2020   Pneumococcal Conjugate-13 06/24/2014   Pneumococcal Polysaccharide-23 04/12/1998   Td 02/21/1996, 07/23/2003   Tdap 03/20/2010   Zoster Recombinat (Shingrix) 09/19/2016, 12/02/2016   Zoster, Live 09/15/2005   Pertinent  Health Maintenance Due  Topic Date Due   INFLUENZA VACCINE  Completed   DEXA SCAN  Completed   Fall Risk 11/12/2020 11/16/2020 11/24/2020 04/26/2021 07/06/2021  Falls in the past year? 0 0 0 1 0  Number of falls in past year - - - - -  Was there an injury with Fall? 0 0 0 0 0  Fall Risk Category Calculator 0 0 0 1 0  Fall Risk Category Low Low Low Low Low  Patient Fall Risk Level Low fall risk Low fall risk Low fall risk Low fall risk Low fall risk  Patient  at Risk for Falls Due to No Fall Risks No Fall Risks History of fall(s) History of fall(s) No Fall Risks  Fall risk Follow up Falls evaluation completed Falls evaluation completed Falls evaluation completed;Education provided;Falls prevention discussed Falls evaluation completed;Education provided;Falls prevention discussed Falls evaluation completed   Functional Status Survey:    There were no vitals filed for this visit. There is no height or weight on file to calculate BMI. Physical Exam  Unable to complete on telephone visit.  Labs reviewed: Recent Labs    08/08/20 2007  NA 137  K 4.3  CL 102  CO2 24  GLUCOSE 106*  BUN 27*  CREATININE 0.70  CALCIUM 9.2   Recent Labs    08/08/20 2007  AST 19  ALT 13  ALKPHOS 44  BILITOT 0.6  PROT 6.4*  ALBUMIN 4.1   Recent Labs    08/08/20 2007 11/12/20 1103  WBC 12.7* 6.9  NEUTROABS 10.6* 3,823  HGB 12.7 11.7  HCT 38.7 36.0  MCV 92.8 92.8  PLT 183 257   Lab Results  Component Value Date   TSH 3.87 06/23/2016   Lab  Results  Component Value Date   HGBA1C 5.3 05/02/2013   Lab Results  Component Value Date   CHOL 188 07/10/2019   HDL 75 07/10/2019   LDLCALC 94 07/10/2019   TRIG 96 07/10/2019   CHOLHDL 2.5 07/10/2019    Significant Diagnostic Results in last 30 days:  No results found.  Assessment/Plan  1. Daytime sleepiness Unclear etiology  Had x 2 episode during yoga class had to be woken up by the instructed. Sleepiness has resolved.tells me feeling better this afternoon.No LOC or weakness reported. - advised to call facility Nurse or provider for any signs of sleepiness or weakness   2. Acute cystitis without hematuria Symptoms resolved.took last antibiotic today. - encouraged to increase fluid intake  - Notify provider if symptoms recurs.  Family/ staff Communication: Reviewed plan of care with patient verbalized understanding   Labs/tests ordered: None   Next Appointment: As needed if symptoms worsen or fail to improve   I connected with  [name] on 07/06/21 by a Telephone enabled telemedicine application and verified that I am speaking with the correct person using two identifiers.   I discussed the limitations of evaluation and management by telemedicine. The patient expressed understanding and agreed to proceed.  Spent 11 minutes of non-face to face with patient  >50% time spent counseling; reviewing medical record; tests; labs; and developing future plan of care.     Sandrea Hughs, NP

## 2021-07-07 DIAGNOSIS — M6281 Muscle weakness (generalized): Secondary | ICD-10-CM | POA: Diagnosis not present

## 2021-07-07 DIAGNOSIS — R2681 Unsteadiness on feet: Secondary | ICD-10-CM | POA: Diagnosis not present

## 2021-07-07 DIAGNOSIS — R2689 Other abnormalities of gait and mobility: Secondary | ICD-10-CM | POA: Diagnosis not present

## 2021-07-07 DIAGNOSIS — Z9181 History of falling: Secondary | ICD-10-CM | POA: Diagnosis not present

## 2021-07-08 ENCOUNTER — Other Ambulatory Visit: Payer: Self-pay

## 2021-07-08 ENCOUNTER — Encounter: Payer: Self-pay | Admitting: Family

## 2021-07-08 ENCOUNTER — Ambulatory Visit (INDEPENDENT_AMBULATORY_CARE_PROVIDER_SITE_OTHER): Payer: Medicare Other | Admitting: Family

## 2021-07-08 ENCOUNTER — Telehealth: Payer: Self-pay

## 2021-07-08 VITALS — BP 150/84 | HR 68 | Temp 97.4°F | Resp 16 | Ht 60.0 in | Wt 131.0 lb

## 2021-07-08 DIAGNOSIS — N23 Unspecified renal colic: Secondary | ICD-10-CM | POA: Diagnosis not present

## 2021-07-08 DIAGNOSIS — M545 Low back pain, unspecified: Secondary | ICD-10-CM | POA: Diagnosis not present

## 2021-07-08 LAB — POCT URINALYSIS DIPSTICK
Bilirubin, UA: NEGATIVE
Glucose, UA: NEGATIVE
Ketones, UA: POSITIVE
Nitrite, UA: NEGATIVE
Protein, UA: NEGATIVE
Spec Grav, UA: 1.02 (ref 1.010–1.025)
Urobilinogen, UA: NEGATIVE E.U./dL — AB
pH, UA: 6 (ref 5.0–8.0)

## 2021-07-08 NOTE — Progress Notes (Signed)
Provider: Hoorain Kozakiewicz FNP-C  Wardell Honour, MD  Patient Care Team: Wardell Honour, MD as PCP - General (Family Medicine) Lorretta Harp, MD as PCP - Cardiology (Cardiology) Laurence Spates, MD (Inactive) as Consulting Physician (Gastroenterology) Myrlene Broker, MD as Attending Physician (Urology) Lorretta Harp, MD as Consulting Physician (Cardiology) Rozetta Nunnery, MD (Inactive) as Consulting Physician (Otolaryngology) Richarda Osmond, [place] (Dentistry) Shon Hough, MD as Consulting Physician (Ophthalmology) Iran Planas, MD as Consulting Physician (Orthopedic Surgery) Greer Pickerel, MD as Consulting Physician (General Surgery)  Extended Emergency Contact Information Primary Emergency Contact: [name] Address: [address]          [place], [place] [address] [name] of [place] Phone: [phone] Work Phone: [phone] Mobile Phone: [phone] Relation: Daughter Secondary Emergency Contact: Arvin Collard Address: [address] [place] [address] [name] of [place] Phone: [phone] Mobile Phone: [phone] Relation: Son  Code Status:  DNR Goals of care: Advanced Directive information Advanced Directives 07/08/2021  Does Patient Have a Medical Advance Directive? Yes  Type of Paramedic of [place];Living will;Out of facility DNR (pink MOST or yellow form)  Does patient want to make changes to medical advance directive? No - Patient declined  Copy of [place] in Chart? Yes - validated most recent copy scanned in chart (See row information)  Would patient like information on creating a medical advance directive? -  Pre-existing out of facility DNR order (yellow form or pink MOST form) -     Chief Complaint  Patient presents with   Acute Visit    Patient complains of possible bladder infection.    HPI:  Pt is a 88 y.o. female seen today for  an acute visit for evaluation of possible bladder infection.states completed antibiotics 2 days ago. States yesterday she started having some bilateral side /lower back pain.could not lie on her back.she dressed up and took her pocket book to go the ED but fell asleep.Recalls prior to symptoms starting she had  some left over meal from previous large dinner she had.she drinks water and cranberry Juice.No urine frequency,urgency or dysuria. She denies any pain today.  No confusion or agitation reported.    Past Medical History:  Diagnosis Date   Cervicalgia    Chest pain at rest 01/07/2012   Displacement of cervical intervertebral disc without myelopathy    Diverticulosis of colon (without mention of hemorrhage)    Female stress incontinence    GERD (gastroesophageal reflux disease)    no peds occasional pepcid   Inguinal hernia without mention of obstruction or gangrene, unilateral or unspecified, (not specified as recurrent)    Internal hemorrhoids without mention of complication    Lumbago    Macular degeneration (senile) of retina, unspecified    Osteoarthrosis, unspecified whether generalized or localized, unspecified site    Other and unspecified hyperlipidemia    Pain in joint, hand    Palpitations    Reflux esophagitis    Scoliosis (and kyphoscoliosis), idiopathic    Senile osteoporosis    Skin disorder    Thyroid disease    TIA (transient ischemic attack)    Unruptured popliteal cyst 06/24/2014   Right knee    Unspecified essential hypertension    Unspecified glaucoma(365.9)    Unspecified hypothyroidism    Unspecified vitamin D deficiency    Past Surgical History:  Procedure Laterality Date   ABDOMINAL HYSTERECTOMY  1975  Dr Mallie Mussel   APPENDECTOMY  1988   cardiolite myocardial perfusion study     DOPPLER ECHOCARDIOGRAPHY     EYE SURGERY Bilateral 2009   cataract removed right eye, Dr Charise Killian   FOOT SURGERY  august 2013   Hewitt, MD   INGUINAL HERNIA REPAIR Bilateral     w/mesh   INGUINAL HERNIA REPAIR Left 08/03/2017   Procedure: LAPAROSCOPIC LEFT INGUINAL HERNIA REPAIR WITH MESH;  Surgeon: Greer Pickerel, MD;  Location: [facility];  Service: General;  Laterality: Left;   INGUINAL HERNIA REPAIR Right 08/03/2017   Procedure: HERNIA REPAIR RIGHT INGUINAL ADULT WITH MESH;  Surgeon: Greer Pickerel, MD;  Location: [facility];  Service: General;  Laterality: Right;   INGUINAL HERNIA REPAIR     Dr. Redmond Pulling 04-24-18   INGUINAL HERNIA REPAIR Right 04/24/2018   Procedure: DIAGNOSTIC LAPAROSCOPIC, OPENREPAIR OF RECURRENT RIGHT INGUINAL HERNIA WITH MESH ERAS PATHWAY;  Surgeon: Greer Pickerel, MD;  Location: [facility];  Service: General;  Laterality: Right;   INSERTION OF MESH Bilateral 08/03/2017   Procedure: INSERTION OF MESH;  Surgeon: Greer Pickerel, MD;  Location: [facility];  Service: General;  Laterality: Bilateral;   NM MYOVIEW LTD     negative   TONSILLECTOMY  1937   TOOTH EXTRACTION  09/16/13   Dr Carlos American    Allergies  Allergen Reactions   Trimethoprim Other (See Comments)    Headaches/ ear pressure    Erythromycin Other (See Comments)    UNSPECIFIED REACTION    Macrodantin [Nitrofurantoin Macrocrystal] Other (See Comments)    UNSPECIFIED REACTION    Penicillins Other (See Comments)    UNSPECIFIED REACTION  Has patient had a PCN reaction causing immediate rash, facial/tongue/throat swelling, SOB or lightheadedness with hypotension: Unknown Has patient had a PCN reaction causing severe rash involving mucus membranes or skin necrosis: Unknown Has patient had a PCN reaction that required hospitalization: No Has patient had a PCN reaction occurring within the last 10 years: No If all of the above answers are "NO", then may proceed with Cephalosporin use.   Sulfa Antibiotics Other (See Comments)    UNSPECIFIED REACTION    Latex Itching    Outpatient Encounter Medications as of 07/08/2021  Medication Sig   acetaminophen (TYLENOL) 500 MG tablet Take 500 mg by mouth 2 (two) times  daily as needed for mild pain or headache.    bimatoprost (LUMIGAN) 0.01 % SOLN Place 1 drop into both eyes at bedtime.   Calcium Carbonate-Vitamin D 600-400 MG-UNIT chew tablet Chew 1 tablet by mouth 2 (two) times daily.    camphor-menthol (SARNA) lotion Apply 1 application topically as needed for itching. Prescribed by Dermatology at [facility]   Cholecalciferol (VITAMIN D3) 50 MCG (2000 UT) TABS Take by mouth.   clopidogrel (PLAVIX) 75 MG tablet 1 tablet daily   CRANBERRY EXTRACT PO Take 2 tablets by mouth daily as needed.   famotidine (PEPCID AC) 10 MG chewable tablet Chew 10 mg by mouth at bedtime as needed for heartburn.   hydrocortisone cream 1 % Apply 1 application topically daily as needed for itching.    metoprolol succinate (TOPROL-XL) 25 MG 24 hr tablet Take one tablet by mouth once daily to regulate heart and control blood pressure.   Multiple Vitamins-Minerals (PRESERVISION AREDS 2 PO) Take 1 capsule by mouth 2 (two) times daily.    Polyethyl Glycol-Propyl Glycol 0.4-0.3 % SOLN Place 1 vial into both eyes 2 (two) times daily. May use another dose during the day as needed for  dry eye   psyllium (METAMUCIL) 58.6 % packet Take 1 packet by mouth daily. Mix with water   vitamin C (ASCORBIC ACID) 500 MG tablet Take 500 mg by mouth daily with supper.    No facility-administered encounter medications on file as of 07/08/2021.    Review of Systems  Constitutional:  Negative for appetite change, chills, fatigue and fever.  Respiratory:  Negative for cough, chest tightness, shortness of breath and wheezing.   Cardiovascular:  Negative for chest pain, palpitations and leg swelling.  Gastrointestinal:  Negative for abdominal distention, abdominal pain, constipation, diarrhea, nausea and vomiting.  Genitourinary:  Negative for difficulty urinating, dysuria, flank pain, frequency, hematuria and urgency.  Musculoskeletal:  Positive for arthralgias and gait problem.       Had bilateral side  pain yesterday but none today   Neurological:  Negative for dizziness, light-headedness, numbness and headaches.  Psychiatric/Behavioral:  Negative for agitation, behavioral problems, confusion and sleep disturbance. The patient is not nervous/anxious.    Immunization History  Administered Date(s) Administered   Fluad Quad(high Dose 65+) 02/04/2019   Influenza Whole 02/24/2011, 02/12/2012   Influenza, High Dose Seasonal PF 02/01/2017, 02/25/2018, 02/11/2020   Influenza,inj,Quad PF,6+ Mos 02/20/2013, 02/10/2014, 02/19/2015, 02/18/2016   Influenza-Unspecified 02/16/2021   Moderna Sars-Covid-2 Vaccination 05/19/2019, 06/16/2019, 03/29/2020, 09/27/2020   Pneumococcal Conjugate-13 06/24/2014   Pneumococcal Polysaccharide-23 04/12/1998   Td 02/21/1996, 07/23/2003   Tdap 03/20/2010   Zoster Recombinat (Shingrix) 09/19/2016, 12/02/2016   Zoster, Live 09/15/2005   Pertinent  Health Maintenance Due  Topic Date Due   INFLUENZA VACCINE  Completed   DEXA SCAN  Completed   Fall Risk 11/16/2020 11/24/2020 04/26/2021 07/06/2021 07/08/2021  Falls in the past year? 0 0 1 0 0  Number of falls in past year - - - - -  Was there an injury with Fall? 0 0 0 0 0  Fall Risk Category Calculator 0 0 1 0 0  Fall Risk Category Low Low Low Low Low  Patient Fall Risk Level Low fall risk Low fall risk Low fall risk Low fall risk Low fall risk  Patient at Risk for Falls Due to No Fall Risks History of fall(s) History of fall(s) No Fall Risks No Fall Risks  Fall risk Follow up Falls evaluation completed Falls evaluation completed;Education provided;Falls prevention discussed Falls evaluation completed;Education provided;Falls prevention discussed Falls evaluation completed Falls evaluation completed   Functional Status Survey:    Vitals:   07/08/21 1550  BP: (!) 150/84  Pulse: 68  Resp: 16  Temp: (!) 97.4 F (36.3 C)  SpO2: 98%  Weight: 131 lb (59.4 kg)  Height: 5' (1.524 m)   Body mass index is 25.58  kg/m. Physical Exam Vitals reviewed.  Constitutional:      General: She is not in acute distress.    Appearance: Normal appearance. She is overweight. She is not ill-appearing.  Eyes:     General: No scleral icterus.       Right eye: No discharge.        Left eye: No discharge.     Conjunctiva/sclera: Conjunctivae normal.     Pupils: Pupils are equal, round, and reactive to light.  Cardiovascular:     Rate and Rhythm: Normal rate and regular rhythm.     Pulses: Normal pulses.     Heart sounds: Normal heart sounds. No murmur heard.   No friction rub. No gallop.  Pulmonary:     Effort: Pulmonary effort is normal. No respiratory distress.  Breath sounds: Normal breath sounds. No wheezing, rhonchi or rales.  Chest:     Chest wall: No tenderness.  Abdominal:     General: Bowel sounds are normal. There is no distension.     Palpations: Abdomen is soft. There is no mass.     Tenderness: There is no abdominal tenderness. There is no right CVA tenderness, left CVA tenderness, guarding or rebound.  Neurological:     Mental Status: She is alert. Mental status is at baseline.     Motor: No weakness.     Gait: Gait abnormal.  Psychiatric:        Mood and Affect: Mood normal.        Speech: Speech normal.        Behavior: Behavior normal.    Labs reviewed: Recent Labs    08/08/20 2007  NA 137  K 4.3  CL 102  CO2 24  GLUCOSE 106*  BUN 27*  CREATININE 0.70  CALCIUM 9.2   Recent Labs    08/08/20 2007  AST 19  ALT 13  ALKPHOS 44  BILITOT 0.6  PROT 6.4*  ALBUMIN 4.1   Recent Labs    08/08/20 2007 11/12/20 1103  WBC 12.7* 6.9  NEUTROABS 10.6* 3,823  HGB 12.7 11.7  HCT 38.7 36.0  MCV 92.8 92.8  PLT 183 257   Lab Results  Component Value Date   TSH 3.87 06/23/2016   Lab Results  Component Value Date   HGBA1C 5.3 05/02/2013   Lab Results  Component Value Date   CHOL 188 07/10/2019   HDL 75 07/10/2019   LDLCALC 94 07/10/2019   TRIG 96 07/10/2019    CHOLHDL 2.5 07/10/2019    Significant Diagnostic Results in last 30 days:  No results found.  Assessment/Plan   Low back pain, unspecified back pain laterality, unspecified chronicity, unspecified whether sciatica present Afebrile  Reports bilateral side pain to lower back yesterday but none today. Thought possible UTI since recently completed antibiotics. - POC Urinalysis Dipstick indicates yellow cloudy urine positive for Ketones, moderate blood and Leukocytes 3 + but negative for nitrites. Discussed results with patient and daughter.will send urine for culture then treat if indicated.  - Urine Culture - continue with fluid intake  - continue on AZO tablets  - Notify provider or go to ED if symptoms worsen  Family/ staff Communication: Reviewed plan of care with patient and daughter verbalized understanding   Labs/tests ordered:  - POC Urinalysis Dipstick  - Urine Culture   Next Appointment: As needed if symptoms worsen or fail to improve    Sandrea Hughs, NP

## 2021-07-08 NOTE — Telephone Encounter (Signed)
Patient called complaining of pain in the abdominal area. Patient states that she felt real bad lastnight. She would like to see Dr. Lillette Boxer. Miller. She stated that she will try to see if she can get an appointment with urologist she was seen by in the past. I offered her to come in to see Marlowe [place], NP but will try to see about getting appointment with urologist.

## 2021-07-10 LAB — URINE CULTURE
MICRO NUMBER:: 13056031
SPECIMEN QUALITY:: ADEQUATE

## 2021-07-12 ENCOUNTER — Telehealth: Payer: Self-pay | Admitting: *Deleted

## 2021-07-12 ENCOUNTER — Other Ambulatory Visit: Payer: Self-pay | Admitting: Family Medicine

## 2021-07-12 DIAGNOSIS — N3000 Acute cystitis without hematuria: Secondary | ICD-10-CM

## 2021-07-12 DIAGNOSIS — M6281 Muscle weakness (generalized): Secondary | ICD-10-CM | POA: Diagnosis not present

## 2021-07-12 DIAGNOSIS — R2681 Unsteadiness on feet: Secondary | ICD-10-CM | POA: Diagnosis not present

## 2021-07-12 DIAGNOSIS — R2689 Other abnormalities of gait and mobility: Secondary | ICD-10-CM | POA: Diagnosis not present

## 2021-07-12 DIAGNOSIS — Z9181 History of falling: Secondary | ICD-10-CM | POA: Diagnosis not present

## 2021-07-12 MED ORDER — URIBEL 118 MG PO CAPS: 1.0000 | ORAL_CAPSULE | Freq: Four times a day (QID) | ORAL | 1 refills | Status: DC

## 2021-07-12 NOTE — Telephone Encounter (Signed)
Patient called again stating that she wants something done and another Antibiotic. Refuses appointment.

## 2021-07-12 NOTE — Telephone Encounter (Signed)
Patient requesting message to be sent to Dr. Sabra Heck also so he is aware. Stated we can leave voicemail message if not available.

## 2021-07-12 NOTE — Progress Notes (Signed)
Patient has been treated with a cephalosporin for Pseudomonas UTI but still having symptoms.  We will try symptomatic treatment with Uribel.

## 2021-07-12 NOTE — Telephone Encounter (Signed)
Patient called and stated that she was seen 07/08/2021.Patient stated that she said she would not take Cipro because it affects foot, causes pain and cramps.  Makes it hurt so bad.   Stated that she still having symptoms but request different antibiotic other than Cipro. Stated that she is having some burning and frequency. Not in pain. Feels like she needs to be taking something. No fever.   Stated that she is drinking a lot of water. Taking AZO all the time, one in the morning and one in the evening.  Patient Did find a note that Dr. Mariea Clonts gave her back in 08/2017 and Dr. Mariea Clonts gave her Keflex. Patient is requesting an antibiotic.     [place], [place]  07/11/2021  4:35 PM EST     Patient was advised and verbalized understanding. She denies any symptoms at this time and reports that Cipro makes her have cramps. She states that symptoms went away after started taking the AZO cranberry pills alone with drinking cranberry juice with water. Message routed back to [place], NP  07/11/2021  4:24 PM EST     Final urine culture indicates 10,000 -49,000 colonies of pseudomonas.usually treated if > 100,000 colonies.If still having symptoms will start on Cipro 500 mg tablet one by mouth twice daily x 7 days. Take along with probiotics Florastor 250 mg capsule one by mouth twice daily x 10 days to prevent antibiotics associated diarrhea  - Increase water intake to 6-8 glasses daily     Please Advise.

## 2021-07-12 NOTE — Telephone Encounter (Signed)
Meredith Honour, MD  You 28 minutes ago (3:44 PM)   According to the culture and sensitivity, this cephalosporin which she took should have cleared the problem.  I will assume that that was the case so now lets try Uribel.  I will call this into her pharmacy     Patient notified and agreed.

## 2021-07-14 DIAGNOSIS — L304 Erythema intertrigo: Secondary | ICD-10-CM | POA: Diagnosis not present

## 2021-07-14 DIAGNOSIS — Z9181 History of falling: Secondary | ICD-10-CM | POA: Diagnosis not present

## 2021-07-14 DIAGNOSIS — L821 Other seborrheic keratosis: Secondary | ICD-10-CM | POA: Diagnosis not present

## 2021-07-14 DIAGNOSIS — D692 Other nonthrombocytopenic purpura: Secondary | ICD-10-CM | POA: Diagnosis not present

## 2021-07-14 DIAGNOSIS — L820 Inflamed seborrheic keratosis: Secondary | ICD-10-CM | POA: Diagnosis not present

## 2021-07-14 DIAGNOSIS — R2681 Unsteadiness on feet: Secondary | ICD-10-CM | POA: Diagnosis not present

## 2021-07-14 DIAGNOSIS — R2689 Other abnormalities of gait and mobility: Secondary | ICD-10-CM | POA: Diagnosis not present

## 2021-07-14 DIAGNOSIS — D1801 Hemangioma of skin and subcutaneous tissue: Secondary | ICD-10-CM | POA: Diagnosis not present

## 2021-07-14 DIAGNOSIS — L814 Other melanin hyperpigmentation: Secondary | ICD-10-CM | POA: Diagnosis not present

## 2021-07-14 DIAGNOSIS — M6281 Muscle weakness (generalized): Secondary | ICD-10-CM | POA: Diagnosis not present

## 2021-07-14 DIAGNOSIS — L570 Actinic keratosis: Secondary | ICD-10-CM | POA: Diagnosis not present

## 2021-07-18 ENCOUNTER — Telehealth: Payer: Self-pay | Admitting: *Deleted

## 2021-07-18 NOTE — Telephone Encounter (Signed)
Patient called and stated that she saw you on 07/08/2021. Patient is wanting to know if she should continue Uribel daily or just when she is having symptoms.  ? ?Please Advise.  ?

## 2021-07-18 NOTE — Telephone Encounter (Signed)
Uribel was ordered by Dr.Miller.please forward to [place].  ?

## 2021-07-19 DIAGNOSIS — R2681 Unsteadiness on feet: Secondary | ICD-10-CM | POA: Diagnosis not present

## 2021-07-19 DIAGNOSIS — M6281 Muscle weakness (generalized): Secondary | ICD-10-CM | POA: Diagnosis not present

## 2021-07-19 DIAGNOSIS — R2689 Other abnormalities of gait and mobility: Secondary | ICD-10-CM | POA: Diagnosis not present

## 2021-07-19 DIAGNOSIS — Z9181 History of falling: Secondary | ICD-10-CM | POA: Diagnosis not present

## 2021-07-19 NOTE — Telephone Encounter (Signed)
Meredith Honour, MD  You 1 hour ago (8:10 AM)  ? ?I would use it prn or whenever she has symptoms   ? ? ? ? ?Patient notified and agreed.  ?

## 2021-07-21 DIAGNOSIS — R2689 Other abnormalities of gait and mobility: Secondary | ICD-10-CM | POA: Diagnosis not present

## 2021-07-21 DIAGNOSIS — M6281 Muscle weakness (generalized): Secondary | ICD-10-CM | POA: Diagnosis not present

## 2021-07-21 DIAGNOSIS — R2681 Unsteadiness on feet: Secondary | ICD-10-CM | POA: Diagnosis not present

## 2021-07-21 DIAGNOSIS — Z9181 History of falling: Secondary | ICD-10-CM | POA: Diagnosis not present

## 2021-07-27 ENCOUNTER — Other Ambulatory Visit: Payer: Self-pay | Admitting: Family Medicine

## 2021-07-27 DIAGNOSIS — I10 Essential (primary) hypertension: Secondary | ICD-10-CM

## 2021-07-28 DIAGNOSIS — R2681 Unsteadiness on feet: Secondary | ICD-10-CM | POA: Diagnosis not present

## 2021-07-28 DIAGNOSIS — R2689 Other abnormalities of gait and mobility: Secondary | ICD-10-CM | POA: Diagnosis not present

## 2021-07-28 DIAGNOSIS — M6281 Muscle weakness (generalized): Secondary | ICD-10-CM | POA: Diagnosis not present

## 2021-07-28 DIAGNOSIS — Z9181 History of falling: Secondary | ICD-10-CM | POA: Diagnosis not present

## 2021-08-01 ENCOUNTER — Encounter: Payer: Self-pay | Admitting: Orthopedic Surgery

## 2021-08-01 ENCOUNTER — Other Ambulatory Visit: Payer: Self-pay

## 2021-08-01 ENCOUNTER — Non-Acute Institutional Stay (INDEPENDENT_AMBULATORY_CARE_PROVIDER_SITE_OTHER): Payer: Medicare Other | Admitting: Orthopedic Surgery

## 2021-08-01 VITALS — BP 136/86 | HR 64 | Temp 98.2°F | Resp 19 | Ht 60.0 in | Wt 133.4 lb

## 2021-08-01 DIAGNOSIS — N390 Urinary tract infection, site not specified: Secondary | ICD-10-CM

## 2021-08-01 DIAGNOSIS — R300 Dysuria: Secondary | ICD-10-CM | POA: Diagnosis not present

## 2021-08-01 DIAGNOSIS — H353211 Exudative age-related macular degeneration, right eye, with active choroidal neovascularization: Secondary | ICD-10-CM | POA: Diagnosis not present

## 2021-08-01 LAB — POCT URINALYSIS DIPSTICK
Bilirubin, UA: NEGATIVE
Blood, UA: NEGATIVE
Glucose, UA: NEGATIVE
Ketones, UA: NEGATIVE
Leukocytes, UA: NEGATIVE
Nitrite, UA: NEGATIVE
Protein, UA: POSITIVE — AB
Spec Grav, UA: 1.02 (ref 1.010–1.025)
Urobilinogen, UA: 0.2 E.U./dL
pH, UA: 6.5 (ref 5.0–8.0)

## 2021-08-01 MED ORDER — URIBEL 118 MG PO CAPS: 1.0000 | ORAL_CAPSULE | Freq: Four times a day (QID) | ORAL | 1 refills | Status: DC

## 2021-08-01 NOTE — Progress Notes (Signed)
Careteam: Patient Care Team: Frederica Kuster, MD as PCP - General (Family Medicine) Runell Gess, MD as PCP - Cardiology (Cardiology) Carman Ching, MD (Inactive) as Consulting Physician (Gastroenterology) Esaw Dace, MD as Attending Physician (Urology) Runell Gess, MD as Consulting Physician (Cardiology) Drema Halon, MD (Inactive) as Consulting Physician (Otolaryngology) Elizebeth Koller, DDS (Dentistry) Mckinley Jewel, MD as Consulting Physician (Ophthalmology) Bradly Bienenstock, MD as Consulting Physician (Orthopedic Surgery) Gaynelle Adu, MD as Consulting Physician (General Surgery)  Seen by: Hazle Nordmann, AGNP-C  [name]:  [facility]  Advanced Directive information Does Patient Have a Medical Advance Directive?: Yes, Type of Advance Directive: Healthcare Power of [place];Living will;Out of facility DNR (pink MOST or yellow form), Does patient want to make changes to medical advance directive?: No - Patient declined  Allergies  Allergen Reactions   Trimethoprim Other (See Comments)    Headaches/ ear pressure    Erythromycin Other (See Comments)    UNSPECIFIED REACTION    Macrodantin [Nitrofurantoin Macrocrystal] Other (See Comments)    UNSPECIFIED REACTION    Penicillins Other (See Comments)    UNSPECIFIED REACTION  Has patient had a PCN reaction causing immediate rash, facial/tongue/throat swelling, SOB or lightheadedness with hypotension: Unknown Has patient had a PCN reaction causing severe rash involving mucus membranes or skin necrosis: Unknown Has patient had a PCN reaction that required hospitalization: No Has patient had a PCN reaction occurring within the last 10 years: No If all of the above answers are "NO", then may proceed with Cephalosporin use.   Sulfa Antibiotics Other (See Comments)    UNSPECIFIED REACTION    Latex Itching    Chief Complaint  Patient presents with   Acute Visit    Burning in vaginal area      HPI: Patient is a 88 y.o. female seen today for acute visit due to dysuria.   Daughter present for encounter.   She has a history of 4 UTI's in the past year, see culture results below. Recently placed on Uribel by Dr. Hyacinth Meeker. Uribel relieved symptoms so she stopped taking about 10 days ago. Symptoms of dysuria began this morning. She remains on cranberry supplement daily. Admits to not drinking water well. Uses briefs and pads due to incontinence. Admits to frequent brief/pad changes throughout the day. Denies fever or flank pain today. Treatment options discussed with patient. She is interested in seeing urology specialist.   Recent urine cultures:  07/08/2021- > 10,000-49,000 CFU/mL pseudomonas aeruginosa  07/01/2021- > 10,000- 49,000 CFU/mL E.coli  09/29/2020- > 10,000- 49,000 CFU/mL enterococcus faecalis  09/07/2020- > 100,000 CFU/mL E. coli   Review of Systems:  Review of Systems  Constitutional:  Negative for chills, fever, malaise/fatigue and weight loss.  Respiratory:  Negative for cough, shortness of breath and wheezing.   Cardiovascular:  Negative for chest pain and leg swelling.  Genitourinary:  Positive for dysuria and frequency. Negative for flank pain and hematuria.       Incontinence  Musculoskeletal:  Negative for falls.  Neurological:  Negative for weakness.  Psychiatric/Behavioral:  Negative for depression. The patient is not nervous/anxious.    Past Medical History:  Diagnosis Date   Cervicalgia    Chest pain at rest 01/07/2012   Displacement of cervical intervertebral disc without myelopathy    Diverticulosis of colon (without mention of hemorrhage)    Female stress incontinence    GERD (gastroesophageal reflux disease)    no peds occasional pepcid   Inguinal hernia  without mention of obstruction or gangrene, unilateral or unspecified, (not specified as recurrent)    Internal hemorrhoids without mention of complication    Lumbago    Macular degeneration  (senile) of retina, unspecified    Osteoarthrosis, unspecified whether generalized or localized, unspecified site    Other and unspecified hyperlipidemia    Pain in joint, hand    Palpitations    Reflux esophagitis    Scoliosis (and kyphoscoliosis), idiopathic    Senile osteoporosis    Skin disorder    Thyroid disease    TIA (transient ischemic attack)    Unruptured popliteal cyst 06/24/2014   Right knee    Unspecified essential hypertension    Unspecified glaucoma(365.9)    Unspecified hypothyroidism    Unspecified vitamin D deficiency    Past Surgical History:  Procedure Laterality Date   ABDOMINAL HYSTERECTOMY  1975   Dr Dewaine Conger   APPENDECTOMY  1988   cardiolite myocardial perfusion study     DOPPLER ECHOCARDIOGRAPHY     EYE SURGERY Bilateral 2009   cataract removed right eye, Dr Emmit Pomfret   FOOT SURGERY  august 2013   Hewitt, MD   INGUINAL HERNIA REPAIR Bilateral    w/mesh   INGUINAL HERNIA REPAIR Left 08/03/2017   Procedure: LAPAROSCOPIC LEFT INGUINAL HERNIA REPAIR WITH MESH;  Surgeon: Gaynelle Adu, MD;  Location: [facility] OR;  Service: General;  Laterality: Left;   INGUINAL HERNIA REPAIR Right 08/03/2017   Procedure: HERNIA REPAIR RIGHT INGUINAL ADULT WITH MESH;  Surgeon: Gaynelle Adu, MD;  Location: [facility] OR;  Service: General;  Laterality: Right;   INGUINAL HERNIA REPAIR     Dr. Andrey Campanile 04-24-18   INGUINAL HERNIA REPAIR Right 04/24/2018   Procedure: DIAGNOSTIC LAPAROSCOPIC, OPENREPAIR OF RECURRENT RIGHT INGUINAL HERNIA WITH MESH ERAS PATHWAY;  Surgeon: Gaynelle Adu, MD;  Location: [facility] ORS;  Service: General;  Laterality: Right;   INSERTION OF MESH Bilateral 08/03/2017   Procedure: INSERTION OF MESH;  Surgeon: Gaynelle Adu, MD;  Location[facility] OR;  Service: General;  Laterality: Bilateral;   NM MYOVIEW LTD     negative   TONSILLECTOMY  1937   TOOTH EXTRACTION  09/16/13   Dr Randel Books   Social History:   reports that she has never smoked. She has never used smokeless tobacco. She reports  that she does not drink alcohol and does not use drugs.  Family History  Problem Relation Age of Onset   CAD Mother    Parkinson's disease Mother    Cancer Father        colon cancer   Parkinson's disease Father    Stroke Maternal Grandmother     Medications: Patient's Medications  New Prescriptions   No medications on file  Previous Medications   ACETAMINOPHEN (TYLENOL) 500 MG TABLET    Take 500 mg by mouth 2 (two) times daily as needed for mild pain or headache.    BIMATOPROST (LUMIGAN) 0.01 % SOLN    Place 1 drop into both eyes at bedtime.   CALCIUM CARBONATE-VITAMIN D 600-400 MG-UNIT CHEW TABLET    Chew 1 tablet by mouth 2 (two) times daily.    CAMPHOR-MENTHOL (SARNA) LOTION    Apply 1 application topically as needed for itching. Prescribed by Dermatology at [facility]   CHOLECALCIFEROL (VITAMIN D3) 50 MCG (2000 UT) TABS    Take by mouth.   CLOPIDOGREL (PLAVIX) 75 MG TABLET    1 tablet daily   CRANBERRY EXTRACT PO    Take 2 tablets by mouth daily  as needed.   FAMOTIDINE (PEPCID AC) 10 MG CHEWABLE TABLET    Chew 10 mg by mouth at bedtime as needed for heartburn.   HYDROCORTISONE CREAM 1 %    Apply 1 application topically daily as needed for itching.    METH-HYO-M BL-NA PHOS-PH SAL (URIBEL) 118 MG CAPS    Take 1 capsule (118 mg total) by mouth in the morning, at noon, in the evening, and at bedtime.   METOPROLOL SUCCINATE (TOPROL-XL) 25 MG 24 HR TABLET    Take one tablet by mouth once daily to regulate heart and control blood pressure.   MULTIPLE VITAMINS-MINERALS (PRESERVISION AREDS 2 PO)    Take 1 capsule by mouth 2 (two) times daily.    POLYETHYL GLYCOL-PROPYL GLYCOL 0.4-0.3 % SOLN    Place 1 vial into both eyes 2 (two) times daily. May use another dose during the day as needed for dry eye   PSYLLIUM (METAMUCIL) 58.6 % PACKET    Take 1 packet by mouth daily. Mix with water   VITAMIN C (ASCORBIC ACID) 500 MG TABLET    Take 500 mg by mouth daily with supper.   Modified  Medications   No medications on file  Discontinued Medications   No medications on file    Physical Exam:  There were no vitals filed for this visit. There is no height or weight on file to calculate BMI. Wt Readings from Last 3 Encounters:  07/08/21 131 lb (59.4 kg)  06/28/21 129 lb 6.4 oz (58.7 kg)  05/27/21 129 lb 6.4 oz (58.7 kg)    Physical Exam Vitals reviewed.  Constitutional:      General: She is not in acute distress. HENT:     Head: Normocephalic.  Eyes:     General:        Right eye: No discharge.        Left eye: No discharge.  Cardiovascular:     Rate and Rhythm: Normal rate and regular rhythm.     Pulses: Normal pulses.     Heart sounds: Normal heart sounds.  Pulmonary:     Effort: Pulmonary effort is normal. No respiratory distress.     Breath sounds: Normal breath sounds. No wheezing.  Abdominal:     General: Bowel sounds are normal. There is no distension.     Palpations: Abdomen is soft.     Tenderness: There is no abdominal tenderness.  Musculoskeletal:     Cervical back: Neck supple.  Skin:    General: Skin is warm and dry.     Capillary Refill: Capillary refill takes less than 2 seconds.  Neurological:     General: No focal deficit present.     Mental Status: She is alert and oriented to person, place, and time.     Motor: Weakness present.     Gait: Gait abnormal.  Psychiatric:        Mood and Affect: Mood normal.        Behavior: Behavior normal.    Labs reviewed: Basic Metabolic Panel: Recent Labs    08/08/20 2007  NA 137  K 4.3  CL 102  CO2 24  GLUCOSE 106*  BUN 27*  CREATININE 0.70  CALCIUM 9.2   Liver Function Tests: Recent Labs    08/08/20 2007  AST 19  ALT 13  ALKPHOS 44  BILITOT 0.6  PROT 6.4*  ALBUMIN 4.1   No results for input(s): LIPASE, AMYLASE in the last 8760 hours. No results for input(s): AMMONIA  in the last 8760 hours. CBC: Recent Labs    08/08/20 2007 11/12/20 1103  WBC 12.7* 6.9  NEUTROABS  10.6* 3,823  HGB 12.7 11.7  HCT 38.7 36.0  MCV 92.8 92.8  PLT 183 257   Lipid Panel: No results for input(s): CHOL, HDL, LDLCALC, TRIG, CHOLHDL, LDLDIRECT in the last 8760 hours. TSH: No results for input(s): TSH in the last 8760 hours. A1C: Lab Results  Component Value Date   HGBA1C 5.3 05/02/2013     Assessment/Plan 1. Dysuria - onset today - afebrile, no cva tenderness - POC Urinalysis Dipstick - Culture, Urine - advised to stop cranberry  - cont Uribel for symptom relief - Meth-Hyo-M Bl-Na Phos-Ph Sal (URIBEL) 118 MG CAPS; Take 1 capsule (118 mg total) by mouth in the morning, at noon, in the evening, and at bedtime.  Dispense: 20 capsule; Refill: 1  2. Frequent UTI  07/08/2021- > 10,000-49,000 CFU/mL pseudomonas aeruginosa  07/01/2021- > 10,000- 49,000 CFU/mL E.coli  09/29/2020- > 10,000- 49,000 CFU/mL enterococcus faecalis  09/07/2020- > 100,000 CFU/mL E. Coli - Ambulatory referral to Urology  Total time: 25 minutes. Greater than 50% of total time spent doing patient education regarding frequent urinary infection, prevention and dehydration prevention.    Next appt: none Jaycee Mckellips Scherry Ran  [facility] [phone]

## 2021-08-01 NOTE — Patient Instructions (Addendum)
Referral made to [facility]- [phone]- please call to schedule appointment ? ?Ok to stop cranberry at this time ? ?Please drink more water ? ?Please take Uribel for symptoms at this time ? ?I will call with urine culture results 03/22- 03/23 ?

## 2021-08-02 LAB — URINE CULTURE
MICRO NUMBER:: 13153316
SPECIMEN QUALITY:: ADEQUATE

## 2021-08-12 DIAGNOSIS — M6281 Muscle weakness (generalized): Secondary | ICD-10-CM | POA: Diagnosis not present

## 2021-08-15 DIAGNOSIS — M6281 Muscle weakness (generalized): Secondary | ICD-10-CM | POA: Diagnosis not present

## 2021-08-17 DIAGNOSIS — N3946 Mixed incontinence: Secondary | ICD-10-CM | POA: Diagnosis not present

## 2021-08-17 DIAGNOSIS — R350 Frequency of micturition: Secondary | ICD-10-CM | POA: Diagnosis not present

## 2021-08-17 DIAGNOSIS — R351 Nocturia: Secondary | ICD-10-CM | POA: Diagnosis not present

## 2021-08-25 DIAGNOSIS — M6281 Muscle weakness (generalized): Secondary | ICD-10-CM | POA: Diagnosis not present

## 2021-08-30 ENCOUNTER — Ambulatory Visit: Payer: Medicare Other | Admitting: Family Medicine

## 2021-08-30 DIAGNOSIS — M6281 Muscle weakness (generalized): Secondary | ICD-10-CM | POA: Diagnosis not present

## 2021-09-01 DIAGNOSIS — M6281 Muscle weakness (generalized): Secondary | ICD-10-CM | POA: Diagnosis not present

## 2021-09-05 DIAGNOSIS — H353211 Exudative age-related macular degeneration, right eye, with active choroidal neovascularization: Secondary | ICD-10-CM | POA: Diagnosis not present

## 2021-09-07 ENCOUNTER — Non-Acute Institutional Stay: Payer: Medicare Other | Admitting: Family Medicine

## 2021-09-07 ENCOUNTER — Encounter: Payer: Self-pay | Admitting: Family Medicine

## 2021-09-07 VITALS — BP 124/66 | HR 60 | Temp 98.6°F | Ht 60.0 in | Wt 136.6 lb

## 2021-09-07 DIAGNOSIS — N3020 Other chronic cystitis without hematuria: Secondary | ICD-10-CM | POA: Diagnosis not present

## 2021-09-07 DIAGNOSIS — I10 Essential (primary) hypertension: Secondary | ICD-10-CM | POA: Diagnosis not present

## 2021-09-07 DIAGNOSIS — R609 Edema, unspecified: Secondary | ICD-10-CM | POA: Diagnosis not present

## 2021-09-07 DIAGNOSIS — I872 Venous insufficiency (chronic) (peripheral): Secondary | ICD-10-CM | POA: Diagnosis not present

## 2021-09-07 NOTE — Progress Notes (Signed)
? ? ?Provider:  ?Alain Honey, MD ? ?Careteam: ?Patient Care Team: ?Wardell Honour, MD as PCP - General (Family Medicine) ?Lorretta Harp, MD as PCP - Cardiology (Cardiology) ?Laurence Spates, MD (Inactive) as Consulting Physician (Gastroenterology) ?Myrlene Broker, MD as Attending Physician (Urology) ?Lorretta Harp, MD as Consulting Physician (Cardiology) ?Rozetta Nunnery, MD (Inactive) as Consulting Physician (Otolaryngology) ?Richarda Osmond, DDS (Dentistry) ?Shon Hough, MD as Consulting Physician (Ophthalmology) ?Iran Planas, MD as Consulting Physician (Orthopedic Surgery) ?Greer Pickerel, MD as Consulting Physician (General Surgery) ? ?[name]:  ?[facility]  ?Advanced Directive information ?  ? ?Allergies  ?Allergen Reactions  ? Trimethoprim Other (See Comments)  ?  Headaches/ ear pressure   ? Erythromycin Other (See Comments)  ?  UNSPECIFIED REACTION   ? Macrodantin [Nitrofurantoin Macrocrystal] Other (See Comments)  ?  UNSPECIFIED REACTION   ? Penicillins Other (See Comments)  ?  UNSPECIFIED REACTION  ?Has patient had a PCN reaction causing immediate rash, facial/tongue/throat swelling, SOB or lightheadedness with hypotension: Unknown ?Has patient had a PCN reaction causing severe rash involving mucus membranes or skin necrosis: Unknown ?Has patient had a PCN reaction that required hospitalization: No ?Has patient had a PCN reaction occurring within the last 10 years: No ?If all of the above answers are "NO", then may proceed with Cephalosporin use.  ? Sulfa Antibiotics Other (See Comments)  ?  UNSPECIFIED REACTION   ? Latex Itching  ? ? ?No chief complaint on file. ? ? ? ?HPI: Patient is a 88 y.o. female .  88 year old female here for routine medical management of chronic problems.  Really this lady is very healthy.  She has had some recent issues with lower urinary tract symptoms.  Cultures have shown combination of mixed flora but sometimes have grown out E. coli and  other times Pseudomonas.  She has seen a urologist recently who started her on long-term maintenance suppressive therapy with Keflex and has follow-up appointment with him next month. ?Memory is good appetite good.  Uses a walker to help with ambulation has had no falls.  Weight is stable.  Cognition shows no problems and her daughter who accompanies her today confirms. ? ?Review of Systems:  ?Review of Systems  ?Constitutional: Negative.   ?Respiratory: Negative.    ?Cardiovascular: Negative.   ?Genitourinary:  Positive for dysuria and frequency.  ?Neurological: Negative.   ?Psychiatric/Behavioral: Negative.    ?All other systems reviewed and are negative. ? ?Past Medical History:  ?Diagnosis Date  ? Cervicalgia   ? Chest pain at rest 01/07/2012  ? Displacement of cervical intervertebral disc without myelopathy   ? Diverticulosis of colon (without mention of hemorrhage)   ? Female stress incontinence   ? GERD (gastroesophageal reflux disease)   ? no peds occasional pepcid  ? Inguinal hernia without mention of obstruction or gangrene, unilateral or unspecified, (not specified as recurrent)   ? Internal hemorrhoids without mention of complication   ? Lumbago   ? Macular degeneration (senile) of retina, unspecified   ? Osteoarthrosis, unspecified whether generalized or localized, unspecified site   ? Other and unspecified hyperlipidemia   ? Pain in joint, hand   ? Palpitations   ? Reflux esophagitis   ? Scoliosis (and kyphoscoliosis), idiopathic   ? Senile osteoporosis   ? Skin disorder   ? Thyroid disease   ? TIA (transient ischemic attack)   ? Unruptured popliteal cyst 06/24/2014  ? Right knee   ? Unspecified essential hypertension   ?  Unspecified glaucoma(365.9)   ? Unspecified hypothyroidism   ? Unspecified vitamin D deficiency   ? ?Past Surgical History:  ?Procedure Laterality Date  ? ABDOMINAL HYSTERECTOMY  1975  ? Dr Mallie Mussel  ? APPENDECTOMY  1988  ? cardiolite myocardial perfusion study    ? DOPPLER  ECHOCARDIOGRAPHY    ? EYE SURGERY Bilateral 2009  ? cataract removed right eye, Dr Charise Killian  ? FOOT SURGERY  august 2013  ? Doran Durand, MD  ? INGUINAL HERNIA REPAIR Bilateral   ? w/mesh  ? INGUINAL HERNIA REPAIR Left 08/03/2017  ? Procedure: LAPAROSCOPIC LEFT INGUINAL HERNIA REPAIR WITH MESH;  Surgeon: Greer Pickerel, MD;  Location: [place];  Service: General;  Laterality: Left;  ? INGUINAL HERNIA REPAIR Right 08/03/2017  ? Procedure: HERNIA REPAIR RIGHT INGUINAL ADULT WITH MESH;  Surgeon: Greer Pickerel, MD;  Location: [place];  Service: General;  Laterality: Right;  ? INGUINAL HERNIA REPAIR    ? Dr. Redmond Pulling 04-24-18  ? INGUINAL HERNIA REPAIR Right 04/24/2018  ? Procedure: DIAGNOSTIC LAPAROSCOPIC, OPENREPAIR OF RECURRENT RIGHT INGUINAL HERNIA WITH MESH ERAS PATHWAY;  Surgeon: Greer Pickerel, MD;  Location: [facility];  Service: General;  Laterality: Right;  ? INSERTION OF MESH Bilateral 08/03/2017  ? Procedure: INSERTION OF MESH;  Surgeon: Greer Pickerel, MD;  Location: [place];  Service: General;  Laterality: Bilateral;  ? NM MYOVIEW LTD    ? negative  ? TONSILLECTOMY  1937  ? TOOTH EXTRACTION  09/16/13  ? Dr Carlos American  ? ?Social History: ?  reports that she has never smoked. She has never used smokeless tobacco. She reports that she does not drink alcohol and does not use drugs. ? ?Family History  ?Problem Relation Age of Onset  ? CAD Mother   ? Parkinson's disease Mother   ? Cancer Father   ?     colon cancer  ? Parkinson's disease Father   ? Stroke Maternal Grandmother   ? ? ?Medications: ?Patient's Medications  ?New Prescriptions  ? No medications on file  ?Previous Medications  ? ACETAMINOPHEN (TYLENOL) 500 MG TABLET    Take 500 mg by mouth 2 (two) times daily as needed for mild pain or headache.   ? BIMATOPROST (LUMIGAN) 0.01 % SOLN    Place 1 drop into both eyes at bedtime.  ? CALCIUM CARBONATE-VITAMIN D 600-400 MG-UNIT CHEW TABLET    Chew 1 tablet by mouth 2 (two) times daily.   ? CAMPHOR-MENTHOL (SARNA) LOTION    Apply 1 application  topically as needed for itching. Prescribed by Dermatology at [facility]  ? CEPHALEXIN (KEFLEX) 250 MG CAPSULE    Take 250 mg by mouth daily.  ? CHOLECALCIFEROL (VITAMIN D3) 50 MCG (2000 UT) TABS    Take by mouth.  ? CLOPIDOGREL (PLAVIX) 75 MG TABLET    1 tablet daily  ? CRANBERRY EXTRACT PO    Take 2 tablets by mouth daily as needed.  ? FAMOTIDINE (PEPCID AC) 10 MG CHEWABLE TABLET    Chew 10 mg by mouth at bedtime as needed for heartburn.  ? HYDROCORTISONE CREAM 1 %    Apply 1 application topically daily as needed for itching.   ? METOPROLOL SUCCINATE (TOPROL-XL) 25 MG 24 HR TABLET    Take one tablet by mouth once daily to regulate heart and control blood pressure.  ? MULTIPLE VITAMINS-MINERALS (PRESERVISION AREDS 2 PO)    Take 1 capsule by mouth 2 (two) times daily.   ? POLYETHYL GLYCOL-PROPYL GLYCOL 0.4-0.3 % SOLN  Place 1 vial into both eyes 2 (two) times daily. May use another dose during the day as needed for dry eye  ? PSYLLIUM (METAMUCIL) 58.6 % PACKET    Take 1 packet by mouth daily. Mix with water  ? VITAMIN C (ASCORBIC ACID) 500 MG TABLET    Take 500 mg by mouth daily with supper.   ?Modified Medications  ? No medications on file  ?Discontinued Medications  ? METH-HYO-M BL-NA PHOS-PH SAL (URIBEL) 118 MG CAPS    Take 1 capsule (118 mg total) by mouth in the morning, at noon, in the evening, and at bedtime.  ? ? ?Physical Exam: ? ?Vitals:  ? 09/07/21 1447  ?Weight: 136 lb 9.6 oz (62 kg)  ?Height: 5' (1.524 m)  ? ?Body mass index is 26.68 kg/m?. ?Wt Readings from Last 3 Encounters:  ?09/07/21 136 lb 9.6 oz (62 kg)  ?08/01/21 133 lb 6.4 oz (60.5 kg)  ?07/08/21 131 lb (59.4 kg)  ? ? ?Physical Exam ?Vitals and nursing note reviewed.  ?Constitutional:   ?   Appearance: Normal appearance.  ?Cardiovascular:  ?   Rate and Rhythm: Normal rate and regular rhythm.  ?Pulmonary:  ?   Effort: Pulmonary effort is normal.  ?   Breath sounds: Normal breath sounds.  ?Neurological:  ?   General: No focal deficit present.   ?   Mental Status: She is alert and oriented to person, place, and time.  ? ? ?Labs reviewed: ?Basic Metabolic Panel: ?No results for input(s): NA, K, CL, CO2, GLUCOSE, BUN, CREATININE, CALCIUM, MG, PHOS, TSH in the

## 2021-09-09 DIAGNOSIS — M6281 Muscle weakness (generalized): Secondary | ICD-10-CM | POA: Diagnosis not present

## 2021-09-12 ENCOUNTER — Other Ambulatory Visit: Payer: Self-pay | Admitting: Family Medicine

## 2021-09-12 DIAGNOSIS — G459 Transient cerebral ischemic attack, unspecified: Secondary | ICD-10-CM

## 2021-09-13 DIAGNOSIS — M6281 Muscle weakness (generalized): Secondary | ICD-10-CM | POA: Diagnosis not present

## 2021-09-20 DIAGNOSIS — M6281 Muscle weakness (generalized): Secondary | ICD-10-CM | POA: Diagnosis not present

## 2021-09-22 DIAGNOSIS — M6281 Muscle weakness (generalized): Secondary | ICD-10-CM | POA: Diagnosis not present

## 2021-09-27 DIAGNOSIS — H5315 Visual distortions of shape and size: Secondary | ICD-10-CM | POA: Diagnosis not present

## 2021-09-27 DIAGNOSIS — H353211 Exudative age-related macular degeneration, right eye, with active choroidal neovascularization: Secondary | ICD-10-CM | POA: Diagnosis not present

## 2021-09-27 DIAGNOSIS — H35363 Drusen (degenerative) of macula, bilateral: Secondary | ICD-10-CM | POA: Diagnosis not present

## 2021-09-27 DIAGNOSIS — H35453 Secondary pigmentary degeneration, bilateral: Secondary | ICD-10-CM | POA: Diagnosis not present

## 2021-09-27 DIAGNOSIS — H35722 Serous detachment of retinal pigment epithelium, left eye: Secondary | ICD-10-CM | POA: Diagnosis not present

## 2021-09-27 DIAGNOSIS — H353122 Nonexudative age-related macular degeneration, left eye, intermediate dry stage: Secondary | ICD-10-CM | POA: Diagnosis not present

## 2021-09-27 DIAGNOSIS — Z961 Presence of intraocular lens: Secondary | ICD-10-CM | POA: Diagnosis not present

## 2021-09-28 DIAGNOSIS — N3946 Mixed incontinence: Secondary | ICD-10-CM | POA: Diagnosis not present

## 2021-09-28 DIAGNOSIS — N130 Hydronephrosis with ureteropelvic junction obstruction: Secondary | ICD-10-CM | POA: Diagnosis not present

## 2021-09-29 DIAGNOSIS — M6281 Muscle weakness (generalized): Secondary | ICD-10-CM | POA: Diagnosis not present

## 2021-10-04 DIAGNOSIS — M6281 Muscle weakness (generalized): Secondary | ICD-10-CM | POA: Diagnosis not present

## 2021-10-06 DIAGNOSIS — M6281 Muscle weakness (generalized): Secondary | ICD-10-CM | POA: Diagnosis not present

## 2021-10-06 DIAGNOSIS — L570 Actinic keratosis: Secondary | ICD-10-CM | POA: Diagnosis not present

## 2021-10-06 DIAGNOSIS — L821 Other seborrheic keratosis: Secondary | ICD-10-CM | POA: Diagnosis not present

## 2021-10-11 DIAGNOSIS — M6281 Muscle weakness (generalized): Secondary | ICD-10-CM | POA: Diagnosis not present

## 2021-10-12 DIAGNOSIS — H35452 Secondary pigmentary degeneration, left eye: Secondary | ICD-10-CM | POA: Diagnosis not present

## 2021-10-12 DIAGNOSIS — H353221 Exudative age-related macular degeneration, left eye, with active choroidal neovascularization: Secondary | ICD-10-CM | POA: Diagnosis not present

## 2021-10-12 DIAGNOSIS — H5315 Visual distortions of shape and size: Secondary | ICD-10-CM | POA: Diagnosis not present

## 2021-10-12 DIAGNOSIS — H35362 Drusen (degenerative) of macula, left eye: Secondary | ICD-10-CM | POA: Diagnosis not present

## 2021-10-12 DIAGNOSIS — H353211 Exudative age-related macular degeneration, right eye, with active choroidal neovascularization: Secondary | ICD-10-CM | POA: Diagnosis not present

## 2021-10-13 DIAGNOSIS — M6281 Muscle weakness (generalized): Secondary | ICD-10-CM | POA: Diagnosis not present

## 2021-10-17 DIAGNOSIS — N1339 Other hydronephrosis: Secondary | ICD-10-CM | POA: Diagnosis not present

## 2021-10-17 DIAGNOSIS — N1330 Unspecified hydronephrosis: Secondary | ICD-10-CM | POA: Diagnosis not present

## 2021-10-17 DIAGNOSIS — I700 Atherosclerosis of aorta: Secondary | ICD-10-CM | POA: Diagnosis not present

## 2021-10-18 DIAGNOSIS — H353221 Exudative age-related macular degeneration, left eye, with active choroidal neovascularization: Secondary | ICD-10-CM | POA: Diagnosis not present

## 2021-10-20 DIAGNOSIS — M6281 Muscle weakness (generalized): Secondary | ICD-10-CM | POA: Diagnosis not present

## 2021-10-24 DIAGNOSIS — M6281 Muscle weakness (generalized): Secondary | ICD-10-CM | POA: Diagnosis not present

## 2021-10-25 DIAGNOSIS — M6281 Muscle weakness (generalized): Secondary | ICD-10-CM | POA: Diagnosis not present

## 2021-11-01 DIAGNOSIS — M25512 Pain in left shoulder: Secondary | ICD-10-CM | POA: Diagnosis not present

## 2021-11-01 DIAGNOSIS — R0789 Other chest pain: Secondary | ICD-10-CM | POA: Diagnosis not present

## 2021-11-01 DIAGNOSIS — M6281 Muscle weakness (generalized): Secondary | ICD-10-CM | POA: Diagnosis not present

## 2021-11-03 ENCOUNTER — Encounter: Payer: Medicare Other | Admitting: Nurse Practitioner

## 2021-11-03 DIAGNOSIS — L570 Actinic keratosis: Secondary | ICD-10-CM | POA: Diagnosis not present

## 2021-11-03 DIAGNOSIS — M6281 Muscle weakness (generalized): Secondary | ICD-10-CM | POA: Diagnosis not present

## 2021-11-03 DIAGNOSIS — D692 Other nonthrombocytopenic purpura: Secondary | ICD-10-CM | POA: Diagnosis not present

## 2021-11-03 DIAGNOSIS — L814 Other melanin hyperpigmentation: Secondary | ICD-10-CM | POA: Diagnosis not present

## 2021-11-08 DIAGNOSIS — M6281 Muscle weakness (generalized): Secondary | ICD-10-CM | POA: Diagnosis not present

## 2021-11-10 DIAGNOSIS — M6281 Muscle weakness (generalized): Secondary | ICD-10-CM | POA: Diagnosis not present

## 2021-11-11 DIAGNOSIS — H401134 Primary open-angle glaucoma, bilateral, indeterminate stage: Secondary | ICD-10-CM | POA: Diagnosis not present

## 2021-11-14 DIAGNOSIS — H353211 Exudative age-related macular degeneration, right eye, with active choroidal neovascularization: Secondary | ICD-10-CM | POA: Diagnosis not present

## 2021-11-17 DIAGNOSIS — L570 Actinic keratosis: Secondary | ICD-10-CM | POA: Diagnosis not present

## 2021-11-17 DIAGNOSIS — D692 Other nonthrombocytopenic purpura: Secondary | ICD-10-CM | POA: Diagnosis not present

## 2021-11-17 DIAGNOSIS — L821 Other seborrheic keratosis: Secondary | ICD-10-CM | POA: Diagnosis not present

## 2021-11-18 DIAGNOSIS — M6281 Muscle weakness (generalized): Secondary | ICD-10-CM | POA: Diagnosis not present

## 2021-11-22 DIAGNOSIS — H353221 Exudative age-related macular degeneration, left eye, with active choroidal neovascularization: Secondary | ICD-10-CM | POA: Diagnosis not present

## 2021-11-24 DIAGNOSIS — M6281 Muscle weakness (generalized): Secondary | ICD-10-CM | POA: Diagnosis not present

## 2021-11-25 DIAGNOSIS — N130 Hydronephrosis with ureteropelvic junction obstruction: Secondary | ICD-10-CM | POA: Diagnosis not present

## 2021-11-25 DIAGNOSIS — N3946 Mixed incontinence: Secondary | ICD-10-CM | POA: Diagnosis not present

## 2021-11-29 DIAGNOSIS — M6281 Muscle weakness (generalized): Secondary | ICD-10-CM | POA: Diagnosis not present

## 2021-12-06 DIAGNOSIS — M6281 Muscle weakness (generalized): Secondary | ICD-10-CM | POA: Diagnosis not present

## 2021-12-08 DIAGNOSIS — M6281 Muscle weakness (generalized): Secondary | ICD-10-CM | POA: Diagnosis not present

## 2021-12-12 DIAGNOSIS — H353211 Exudative age-related macular degeneration, right eye, with active choroidal neovascularization: Secondary | ICD-10-CM | POA: Diagnosis not present

## 2021-12-14 DIAGNOSIS — M6281 Muscle weakness (generalized): Secondary | ICD-10-CM | POA: Diagnosis not present

## 2021-12-15 DIAGNOSIS — M6281 Muscle weakness (generalized): Secondary | ICD-10-CM | POA: Diagnosis not present

## 2021-12-16 DIAGNOSIS — M6281 Muscle weakness (generalized): Secondary | ICD-10-CM | POA: Diagnosis not present

## 2021-12-20 DIAGNOSIS — M6281 Muscle weakness (generalized): Secondary | ICD-10-CM | POA: Diagnosis not present

## 2021-12-22 DIAGNOSIS — M6281 Muscle weakness (generalized): Secondary | ICD-10-CM | POA: Diagnosis not present

## 2021-12-26 DIAGNOSIS — H353221 Exudative age-related macular degeneration, left eye, with active choroidal neovascularization: Secondary | ICD-10-CM | POA: Diagnosis not present

## 2022-01-03 DIAGNOSIS — H35453 Secondary pigmentary degeneration, bilateral: Secondary | ICD-10-CM | POA: Diagnosis not present

## 2022-01-03 DIAGNOSIS — H353231 Exudative age-related macular degeneration, bilateral, with active choroidal neovascularization: Secondary | ICD-10-CM | POA: Diagnosis not present

## 2022-01-03 DIAGNOSIS — H35363 Drusen (degenerative) of macula, bilateral: Secondary | ICD-10-CM | POA: Diagnosis not present

## 2022-01-03 DIAGNOSIS — Z961 Presence of intraocular lens: Secondary | ICD-10-CM | POA: Diagnosis not present

## 2022-01-11 DIAGNOSIS — H353211 Exudative age-related macular degeneration, right eye, with active choroidal neovascularization: Secondary | ICD-10-CM | POA: Diagnosis not present

## 2022-01-30 DIAGNOSIS — H353221 Exudative age-related macular degeneration, left eye, with active choroidal neovascularization: Secondary | ICD-10-CM | POA: Diagnosis not present

## 2022-02-01 ENCOUNTER — Other Ambulatory Visit: Payer: Self-pay | Admitting: Family Medicine

## 2022-02-01 DIAGNOSIS — I10 Essential (primary) hypertension: Secondary | ICD-10-CM

## 2022-02-09 DIAGNOSIS — Z23 Encounter for immunization: Secondary | ICD-10-CM | POA: Diagnosis not present

## 2022-02-27 DIAGNOSIS — H353211 Exudative age-related macular degeneration, right eye, with active choroidal neovascularization: Secondary | ICD-10-CM | POA: Diagnosis not present

## 2022-03-01 DIAGNOSIS — H353221 Exudative age-related macular degeneration, left eye, with active choroidal neovascularization: Secondary | ICD-10-CM | POA: Diagnosis not present

## 2022-03-08 ENCOUNTER — Non-Acute Institutional Stay: Payer: Medicare Other | Admitting: Family Medicine

## 2022-03-08 ENCOUNTER — Encounter: Payer: Self-pay | Admitting: Family Medicine

## 2022-03-08 VITALS — BP 130/84 | HR 78 | Temp 96.5°F | Ht 60.0 in | Wt 138.2 lb

## 2022-03-08 DIAGNOSIS — I10 Essential (primary) hypertension: Secondary | ICD-10-CM

## 2022-03-08 DIAGNOSIS — R300 Dysuria: Secondary | ICD-10-CM

## 2022-03-08 DIAGNOSIS — I872 Venous insufficiency (chronic) (peripheral): Secondary | ICD-10-CM

## 2022-03-08 DIAGNOSIS — E782 Mixed hyperlipidemia: Secondary | ICD-10-CM

## 2022-03-08 NOTE — Progress Notes (Signed)
Provider:  Alain Honey, MD  Careteam: Patient Care Team: Wardell Honour, MD as PCP - General (Family Medicine) Lorretta Harp, MD as PCP - Cardiology (Cardiology) Laurence Spates, MD (Inactive) as Consulting Physician (Gastroenterology) Myrlene Broker, MD as Attending Physician (Urology) Lorretta Harp, MD as Consulting Physician (Cardiology) Rozetta Nunnery, MD (Inactive) as Consulting Physician (Otolaryngology) Richarda Osmond, DDS (Dentistry) Shon Hough, MD as Consulting Physician (Ophthalmology) Iran Planas, MD as Consulting Physician (Orthopedic Surgery) Greer Pickerel, MD as Consulting Physician (General Surgery)  [name]:  [place]  Advanced Directive information    Allergies  Allergen Reactions   Trimethoprim Other (See Comments)    Headaches/ ear pressure    Erythromycin Other (See Comments)    UNSPECIFIED REACTION    Macrodantin [Nitrofurantoin Macrocrystal] Other (See Comments)    UNSPECIFIED REACTION    Penicillins Other (See Comments)    UNSPECIFIED REACTION  Has patient had a PCN reaction causing immediate rash, facial/tongue/throat swelling, SOB or lightheadedness with hypotension: Unknown Has patient had a PCN reaction causing severe rash involving mucus membranes or skin necrosis: Unknown Has patient had a PCN reaction that required hospitalization: No Has patient had a PCN reaction occurring within the last 10 years: No If all of the above answers are "NO", then may proceed with Cephalosporin use.   Sulfa Antibiotics Other (See Comments)    UNSPECIFIED REACTION    Latex Itching    No chief complaint on file.    HPI: Patient is a 88 y.o. female, resident at friend's home when asked independent living.  She has been seen recently for urinary frequency and dysuria but has not had recent problems.  We attribute this to her being more diligent with water intake.  She has no other new complaints today. Appetite  is good.  Sleeps well.  Uses her walker for ambulation has had no falls.  Tries to exercise with walking and offerings at friend's home when asked Has gotten her flu shot and also recommended RSV Memory is appropriate for age  Review of Systems:  Review of Systems  Constitutional: Negative.   HENT: Negative.    Respiratory: Negative.    Cardiovascular: Negative.   Gastrointestinal: Negative.   Genitourinary: Negative.   Musculoskeletal: Negative.   Skin: Negative.   Neurological: Negative.   All other systems reviewed and are negative.   Past Medical History:  Diagnosis Date   Cervicalgia    Chest pain at rest 01/07/2012   Displacement of cervical intervertebral disc without myelopathy    Diverticulosis of colon (without mention of hemorrhage)    Female stress incontinence    GERD (gastroesophageal reflux disease)    no peds occasional pepcid   Inguinal hernia without mention of obstruction or gangrene, unilateral or unspecified, (not specified as recurrent)    Internal hemorrhoids without mention of complication    Lumbago    Macular degeneration (senile) of retina, unspecified    Osteoarthrosis, unspecified whether generalized or localized, unspecified site    Other and unspecified hyperlipidemia    Pain in joint, hand    Palpitations    Reflux esophagitis    Scoliosis (and kyphoscoliosis), idiopathic    Senile osteoporosis    Skin disorder    Thyroid disease    TIA (transient ischemic attack)    Unruptured popliteal cyst 06/24/2014   Right knee    Unspecified essential hypertension    Unspecified glaucoma(365.9)    Unspecified hypothyroidism    Unspecified  vitamin D deficiency    Past Surgical History:  Procedure Laterality Date   ABDOMINAL HYSTERECTOMY  1975   Dr Mallie Mussel   APPENDECTOMY  1988   cardiolite myocardial perfusion study     DOPPLER ECHOCARDIOGRAPHY     EYE SURGERY Bilateral 2009   cataract removed right eye, Dr Charise Killian   FOOT SURGERY  august 2013    Hewitt, MD   INGUINAL HERNIA REPAIR Bilateral    w/mesh   INGUINAL HERNIA REPAIR Left 08/03/2017   Procedure: LAPAROSCOPIC LEFT INGUINAL HERNIA REPAIR WITH MESH;  Surgeon: Greer Pickerel, MD;  Location: [facility];  Service: General;  Laterality: Left;   INGUINAL HERNIA REPAIR Right 08/03/2017   Procedure: HERNIA REPAIR RIGHT INGUINAL ADULT WITH MESH;  Surgeon: Greer Pickerel, MD;  Location: [facility];  Service: General;  Laterality: Right;   INGUINAL HERNIA REPAIR     Dr. Redmond Pulling 04-24-18   INGUINAL HERNIA REPAIR Right 04/24/2018   Procedure: DIAGNOSTIC LAPAROSCOPIC, OPENREPAIR OF RECURRENT RIGHT INGUINAL HERNIA WITH MESH ERAS PATHWAY;  Surgeon: Greer Pickerel, MD;  Location: [facility];  Service: General;  Laterality: Right;   INSERTION OF MESH Bilateral 08/03/2017   Procedure: INSERTION OF MESH;  Surgeon: Greer Pickerel, MD;  Location: [facility];  Service: General;  Laterality: Bilateral;   NM MYOVIEW LTD     negative   TONSILLECTOMY  1937   TOOTH EXTRACTION  09/16/13   Dr Carlos American   Social History:   reports that she has never smoked. She has never used smokeless tobacco. She reports that she does not drink alcohol and does not use drugs.  Family History  Problem Relation Age of Onset   CAD Mother    Parkinson's disease Mother    Cancer Father        colon cancer   Parkinson's disease Father    Stroke Maternal Grandmother     Medications: Patient's Medications  New Prescriptions   No medications on file  Previous Medications   ACETAMINOPHEN (TYLENOL) 500 MG TABLET    Take 500 mg by mouth 2 (two) times daily as needed for mild pain or headache.    BIMATOPROST (LUMIGAN) 0.01 % SOLN    Place 1 drop into both eyes at bedtime.   CALCIUM CARBONATE-VITAMIN D 600-400 MG-UNIT CHEW TABLET    Chew 1 tablet by mouth 2 (two) times daily.    CAMPHOR-MENTHOL (SARNA) LOTION    Apply 1 application topically as needed for itching. Prescribed by Dermatology at [facility]   CEPHALEXIN (KEFLEX) 250 MG CAPSULE    Take 250  mg by mouth daily.   CHOLECALCIFEROL (VITAMIN D3) 50 MCG (2000 UT) TABS    Take by mouth.   CLOPIDOGREL (PLAVIX) 75 MG TABLET    TAKE ONE TABLET BY MOUTH DAILY   CRANBERRY EXTRACT PO    Take 2 tablets by mouth daily as needed.   FAMOTIDINE (PEPCID AC) 10 MG CHEWABLE TABLET    Chew 10 mg by mouth at bedtime as needed for heartburn.   HYDROCORTISONE CREAM 1 %    Apply 1 application topically daily as needed for itching.    METOPROLOL SUCCINATE (TOPROL-XL) 25 MG 24 HR TABLET    Take one tablet by mouth once daily to regulate heart and control blood pressure.   MULTIPLE VITAMINS-MINERALS (PRESERVISION AREDS 2 PO)    Take 1 capsule by mouth 2 (two) times daily.    POLYETHYL GLYCOL-PROPYL GLYCOL 0.4-0.3 % SOLN    Place 1 vial into both eyes 2 (  two) times daily. May use another dose during the day as needed for dry eye   PSYLLIUM (METAMUCIL) 58.6 % PACKET    Take 1 packet by mouth daily. Mix with water   VITAMIN C (ASCORBIC ACID) 500 MG TABLET    Take 500 mg by mouth daily with supper.   Modified Medications   No medications on file  Discontinued Medications   No medications on file    Physical Exam:  There were no vitals filed for this visit. There is no height or weight on file to calculate BMI. Wt Readings from Last 3 Encounters:  09/07/21 136 lb 9.6 oz (62 kg)  08/01/21 133 lb 6.4 oz (60.5 kg)  07/08/21 131 lb (59.4 kg)    Physical Exam Vitals and nursing note reviewed.  Constitutional:      Appearance: Normal appearance.  HENT:     Mouth/Throat:     Mouth: Mucous membranes are moist.     Pharynx: Oropharynx is clear.  Eyes:     Extraocular Movements: Extraocular movements intact.     Pupils: Pupils are equal, round, and reactive to light.  Cardiovascular:     Rate and Rhythm: Normal rate.     Heart sounds: Murmur heard.  Pulmonary:     Effort: Pulmonary effort is normal.     Breath sounds: Normal breath sounds.  Musculoskeletal:     Right lower leg: No edema.     Left  lower leg: No edema.  Neurological:     General: No focal deficit present.     Mental Status: She is alert and oriented to person, place, and time.  Psychiatric:        Mood and Affect: Mood normal.        Behavior: Behavior normal.     Labs reviewed: Basic Metabolic Panel: No results for input(s): "NA", "K", "CL", "CO2", "GLUCOSE", "BUN", "CREATININE", "CALCIUM", "MG", "PHOS", "TSH" in the last 8760 hours. Liver Function Tests: No results for input(s): "AST", "ALT", "ALKPHOS", "BILITOT", "PROT", "ALBUMIN" in the last 8760 hours. No results for input(s): "LIPASE", "AMYLASE" in the last 8760 hours. No results for input(s): "AMMONIA" in the last 8760 hours. CBC: No results for input(s): "WBC", "NEUTROABS", "HGB", "HCT", "MCV", "PLT" in the last 8760 hours. Lipid Panel: No results for input(s): "CHOL", "HDL", "LDLCALC", "TRIG", "CHOLHDL", "LDLDIRECT" in the last 8760 hours. TSH: No results for input(s): "TSH" in the last 8760 hours. A1C: Lab Results  Component Value Date   HGBA1C 5.3 05/02/2013     Assessment/Plan  1. Essential hypertension Blood pressure 130/84 today she takes metoprolol 25 mg extended release  2. Mixed hyperlipidemia She is not taking any medicine last LDL was 94.  Her weight is not changed appetite has not changed  3. Chronic venous insufficiency She has some zip up support "stockings" that she wears but she does not like to wear them.  I told her as long as she watches her salt intake keeps walking and elevate her legs whenever she is seated if we can keep any edema or swelling down doing those since she does not need to wear the zippered stockings  4. Dysuria Asymptomatic at present continue water intake  Alain Honey, MD [place] [phone]

## 2022-03-16 DIAGNOSIS — D692 Other nonthrombocytopenic purpura: Secondary | ICD-10-CM | POA: Diagnosis not present

## 2022-03-16 DIAGNOSIS — L820 Inflamed seborrheic keratosis: Secondary | ICD-10-CM | POA: Diagnosis not present

## 2022-03-16 DIAGNOSIS — L821 Other seborrheic keratosis: Secondary | ICD-10-CM | POA: Diagnosis not present

## 2022-03-16 DIAGNOSIS — L570 Actinic keratosis: Secondary | ICD-10-CM | POA: Diagnosis not present

## 2022-03-20 ENCOUNTER — Other Ambulatory Visit: Payer: Medicare Other

## 2022-03-20 DIAGNOSIS — E782 Mixed hyperlipidemia: Secondary | ICD-10-CM | POA: Diagnosis not present

## 2022-03-20 DIAGNOSIS — I10 Essential (primary) hypertension: Secondary | ICD-10-CM | POA: Diagnosis not present

## 2022-03-20 LAB — COMPLETE METABOLIC PANEL WITH GFR
AG Ratio: 1.6 (calc) (ref 1.0–2.5)
ALT: 16 U/L (ref 6–29)
AST: 17 U/L (ref 10–35)
Albumin: 3.9 g/dL (ref 3.6–5.1)
Alkaline phosphatase (APISO): 61 U/L (ref 37–153)
BUN: 25 mg/dL (ref 7–25)
CO2: 29 mmol/L (ref 20–32)
Calcium: 9.5 mg/dL (ref 8.6–10.4)
Chloride: 105 mmol/L (ref 98–110)
Creat: 0.63 mg/dL (ref 0.60–0.95)
Globulin: 2.4 g/dL (calc) (ref 1.9–3.7)
Glucose, Bld: 87 mg/dL (ref 65–99)
Potassium: 4.1 mmol/L (ref 3.5–5.3)
Sodium: 141 mmol/L (ref 135–146)
Total Bilirubin: 0.6 mg/dL (ref 0.2–1.2)
Total Protein: 6.3 g/dL (ref 6.1–8.1)
eGFR: 84 mL/min/{1.73_m2} (ref >=60)

## 2022-03-20 LAB — EXTRA LAV TOP TUBE

## 2022-03-21 ENCOUNTER — Other Ambulatory Visit: Payer: Medicare Other

## 2022-03-29 DIAGNOSIS — H353211 Exudative age-related macular degeneration, right eye, with active choroidal neovascularization: Secondary | ICD-10-CM | POA: Diagnosis not present

## 2022-04-10 DIAGNOSIS — H353221 Exudative age-related macular degeneration, left eye, with active choroidal neovascularization: Secondary | ICD-10-CM | POA: Diagnosis not present

## 2022-04-20 DIAGNOSIS — L570 Actinic keratosis: Secondary | ICD-10-CM | POA: Diagnosis not present

## 2022-04-20 DIAGNOSIS — L820 Inflamed seborrheic keratosis: Secondary | ICD-10-CM | POA: Diagnosis not present

## 2022-04-20 DIAGNOSIS — L814 Other melanin hyperpigmentation: Secondary | ICD-10-CM | POA: Diagnosis not present

## 2022-04-20 DIAGNOSIS — L821 Other seborrheic keratosis: Secondary | ICD-10-CM | POA: Diagnosis not present

## 2022-04-23 ENCOUNTER — Other Ambulatory Visit: Payer: Self-pay | Admitting: Family Medicine

## 2022-04-23 DIAGNOSIS — I10 Essential (primary) hypertension: Secondary | ICD-10-CM

## 2022-04-24 NOTE — Telephone Encounter (Signed)
Patient has request refill on medication Metoprolol '25mg'$ . Patient medication has warnings. Medication pend and sent to Sherrie Mustache, NP.

## 2022-04-27 DIAGNOSIS — H353211 Exudative age-related macular degeneration, right eye, with active choroidal neovascularization: Secondary | ICD-10-CM | POA: Diagnosis not present

## 2022-05-19 DIAGNOSIS — H353221 Exudative age-related macular degeneration, left eye, with active choroidal neovascularization: Secondary | ICD-10-CM | POA: Diagnosis not present

## 2022-05-31 DIAGNOSIS — H353211 Exudative age-related macular degeneration, right eye, with active choroidal neovascularization: Secondary | ICD-10-CM | POA: Diagnosis not present

## 2022-06-15 DIAGNOSIS — L820 Inflamed seborrheic keratosis: Secondary | ICD-10-CM | POA: Diagnosis not present

## 2022-06-15 DIAGNOSIS — L814 Other melanin hyperpigmentation: Secondary | ICD-10-CM | POA: Diagnosis not present

## 2022-06-15 DIAGNOSIS — L821 Other seborrheic keratosis: Secondary | ICD-10-CM | POA: Diagnosis not present

## 2022-06-15 DIAGNOSIS — L570 Actinic keratosis: Secondary | ICD-10-CM | POA: Diagnosis not present

## 2022-06-19 DIAGNOSIS — H353221 Exudative age-related macular degeneration, left eye, with active choroidal neovascularization: Secondary | ICD-10-CM | POA: Diagnosis not present

## 2022-06-20 DIAGNOSIS — H35363 Drusen (degenerative) of macula, bilateral: Secondary | ICD-10-CM | POA: Diagnosis not present

## 2022-06-20 DIAGNOSIS — H35453 Secondary pigmentary degeneration, bilateral: Secondary | ICD-10-CM | POA: Diagnosis not present

## 2022-06-20 DIAGNOSIS — H353231 Exudative age-related macular degeneration, bilateral, with active choroidal neovascularization: Secondary | ICD-10-CM | POA: Diagnosis not present

## 2022-06-20 DIAGNOSIS — Z961 Presence of intraocular lens: Secondary | ICD-10-CM | POA: Diagnosis not present

## 2022-06-20 DIAGNOSIS — H5315 Visual distortions of shape and size: Secondary | ICD-10-CM | POA: Diagnosis not present

## 2022-07-03 ENCOUNTER — Other Ambulatory Visit: Payer: Self-pay | Admitting: Family Medicine

## 2022-07-03 DIAGNOSIS — G459 Transient cerebral ischemic attack, unspecified: Secondary | ICD-10-CM

## 2022-07-12 DIAGNOSIS — R278 Other lack of coordination: Secondary | ICD-10-CM | POA: Diagnosis not present

## 2022-07-12 DIAGNOSIS — M6281 Muscle weakness (generalized): Secondary | ICD-10-CM | POA: Diagnosis not present

## 2022-07-18 DIAGNOSIS — Z9181 History of falling: Secondary | ICD-10-CM | POA: Diagnosis not present

## 2022-07-18 DIAGNOSIS — R2681 Unsteadiness on feet: Secondary | ICD-10-CM | POA: Diagnosis not present

## 2022-07-18 DIAGNOSIS — R278 Other lack of coordination: Secondary | ICD-10-CM | POA: Diagnosis not present

## 2022-07-18 DIAGNOSIS — M6281 Muscle weakness (generalized): Secondary | ICD-10-CM | POA: Diagnosis not present

## 2022-07-19 DIAGNOSIS — H353211 Exudative age-related macular degeneration, right eye, with active choroidal neovascularization: Secondary | ICD-10-CM | POA: Diagnosis not present

## 2022-07-20 DIAGNOSIS — R278 Other lack of coordination: Secondary | ICD-10-CM | POA: Diagnosis not present

## 2022-07-20 DIAGNOSIS — M6281 Muscle weakness (generalized): Secondary | ICD-10-CM | POA: Diagnosis not present

## 2022-07-20 DIAGNOSIS — Z9181 History of falling: Secondary | ICD-10-CM | POA: Diagnosis not present

## 2022-07-20 DIAGNOSIS — R2681 Unsteadiness on feet: Secondary | ICD-10-CM | POA: Diagnosis not present

## 2022-07-25 DIAGNOSIS — Z9181 History of falling: Secondary | ICD-10-CM | POA: Diagnosis not present

## 2022-07-25 DIAGNOSIS — M6281 Muscle weakness (generalized): Secondary | ICD-10-CM | POA: Diagnosis not present

## 2022-07-25 DIAGNOSIS — R2681 Unsteadiness on feet: Secondary | ICD-10-CM | POA: Diagnosis not present

## 2022-07-25 DIAGNOSIS — R278 Other lack of coordination: Secondary | ICD-10-CM | POA: Diagnosis not present

## 2022-07-26 ENCOUNTER — Emergency Department (HOSPITAL_COMMUNITY): Payer: Medicare Other

## 2022-07-26 ENCOUNTER — Other Ambulatory Visit: Payer: Self-pay

## 2022-07-26 ENCOUNTER — Emergency Department (HOSPITAL_COMMUNITY)
Admission: EM | Admit: 2022-07-26 | Discharge: 2022-07-26 | Disposition: A | Discharge status: Home / Self Care | Payer: Medicare Other | Attending: Emergency Medicine | Admitting: Emergency Medicine

## 2022-07-26 ENCOUNTER — Encounter (HOSPITAL_COMMUNITY): Payer: Self-pay

## 2022-07-26 DIAGNOSIS — I10 Essential (primary) hypertension: Secondary | ICD-10-CM | POA: Insufficient documentation

## 2022-07-26 DIAGNOSIS — W19XXXA Unspecified fall, initial encounter: Secondary | ICD-10-CM | POA: Diagnosis not present

## 2022-07-26 DIAGNOSIS — Z8673 Personal history of transient ischemic attack (TIA), and cerebral infarction without residual deficits: Secondary | ICD-10-CM | POA: Diagnosis not present

## 2022-07-26 DIAGNOSIS — G4489 Other headache syndrome: Secondary | ICD-10-CM | POA: Diagnosis not present

## 2022-07-26 DIAGNOSIS — Z7902 Long term (current) use of antithrombotics/antiplatelets: Secondary | ICD-10-CM | POA: Diagnosis not present

## 2022-07-26 DIAGNOSIS — E039 Hypothyroidism, unspecified: Secondary | ICD-10-CM | POA: Insufficient documentation

## 2022-07-26 DIAGNOSIS — S7002XA Contusion of left hip, initial encounter: Secondary | ICD-10-CM | POA: Diagnosis not present

## 2022-07-26 DIAGNOSIS — Z79899 Other long term (current) drug therapy: Secondary | ICD-10-CM | POA: Insufficient documentation

## 2022-07-26 DIAGNOSIS — Z91040 Latex allergy status: Secondary | ICD-10-CM | POA: Diagnosis not present

## 2022-07-26 DIAGNOSIS — R42 Dizziness and giddiness: Secondary | ICD-10-CM | POA: Diagnosis not present

## 2022-07-26 DIAGNOSIS — R519 Headache, unspecified: Secondary | ICD-10-CM | POA: Diagnosis not present

## 2022-07-26 DIAGNOSIS — W010XXA Fall on same level from slipping, tripping and stumbling without subsequent striking against object, initial encounter: Secondary | ICD-10-CM | POA: Diagnosis not present

## 2022-07-26 DIAGNOSIS — R410 Disorientation, unspecified: Secondary | ICD-10-CM | POA: Diagnosis not present

## 2022-07-26 DIAGNOSIS — M545 Low back pain, unspecified: Secondary | ICD-10-CM | POA: Diagnosis present

## 2022-07-26 LAB — CBC WITH DIFFERENTIAL/PLATELET
Abs Immature Granulocytes: 0.03 10*3/uL (ref 0.00–0.07)
Basophils Absolute: 0.1 10*3/uL (ref 0.0–0.1)
Basophils Relative: 1 %
Eosinophils Absolute: 0.2 10*3/uL (ref 0.0–0.5)
Eosinophils Relative: 2 %
HCT: 41 % (ref 36.0–46.0)
Hemoglobin: 13.6 g/dL (ref 12.0–15.0)
Immature Granulocytes: 0 %
Lymphocytes Relative: 30 %
Lymphs Abs: 2.1 10*3/uL (ref 0.7–4.0)
MCH: 30.7 pg (ref 26.0–34.0)
MCHC: 33.2 g/dL (ref 30.0–36.0)
MCV: 92.6 fL (ref 80.0–100.0)
Monocytes Absolute: 0.6 10*3/uL (ref 0.1–1.0)
Monocytes Relative: 9 %
Neutro Abs: 4.1 10*3/uL (ref 1.7–7.7)
Neutrophils Relative %: 58 %
Platelets: 207 10*3/uL (ref 150–400)
RBC: 4.43 MIL/uL (ref 3.87–5.11)
RDW: 15.5 % (ref 11.5–15.5)
WBC: 7 10*3/uL (ref 4.0–10.5)
nRBC: 0 % (ref 0.0–0.2)

## 2022-07-26 LAB — BASIC METABOLIC PANEL
Anion gap: 10 (ref 5–15)
BUN: 18 mg/dL (ref 8–23)
CO2: 27 mmol/L (ref 22–32)
Calcium: 9.8 mg/dL (ref 8.9–10.3)
Chloride: 102 mmol/L (ref 98–111)
Creatinine, Ser: 0.74 mg/dL (ref 0.44–1.00)
GFR, Estimated: >60 mL/min (ref >60)
Glucose, Bld: 100 mg/dL — ABNORMAL HIGH (ref 70–99)
Potassium: 4.2 mmol/L (ref 3.5–5.1)
Sodium: 139 mmol/L (ref 135–145)

## 2022-07-26 LAB — PROTIME-INR
INR: 1 (ref 0.8–1.2)
Prothrombin Time: 13 seconds (ref 11.4–15.2)

## 2022-07-26 MED ORDER — ACETAMINOPHEN 500 MG PO TABS
1000.0000 mg | ORAL_TABLET | Freq: Once | ORAL | Status: AC
  Administered 2022-07-26: 1000 mg via ORAL
  Filled 2022-07-26: qty 2

## 2022-07-26 NOTE — ED Notes (Signed)
Pt ambulating appropriately with the use of a walker, this is baseline for the pt

## 2022-07-26 NOTE — ED Triage Notes (Signed)
Per EMS had a fall from tripping. Pt reports tremors in hands after fall. Headache posterior. Dizziness after the fall. But dizziness is gone. Pt did not hit her head.  EMS VS  186/86 72 RR 16 107 CBG Oxygen 98% RA   20 left AC

## 2022-07-26 NOTE — Discharge Instructions (Addendum)
We evaluated you after your fall. Your testing including CT scans of your head and neck did not show any fracture or head injury. We evaluated your left sided pain, this is most likely due to a soft tissue injury (contusion). You may develop some bruising over this area. Please take 1000 mg of tylenol every 6 hours as needed for pain. You can also apply ice to the area. You were able to walk with your walker around the emergency department, so the chance of fracture is extremely low.   Please return if you develop any new or worsening pain, lightheadedness or dizziness, headaches, numbness or tingling, weakness, or any other concerning symptoms.

## 2022-07-26 NOTE — ED Provider Notes (Signed)
[facility] Provider Note  CSN: [id number] Arrival date & time: 07/26/22 1646  Chief Complaint(s) Fall  HPI [name] is a 89 y.o. female with history of hypertension arthritis, TIA, hypothyroidism presenting to the emergency department with mechanical fall.  Patient reports that she was using her walker to go to a painting class and going very quickly when she had her feet get caught and she fell.  She reports that she fell backwards.  Does not think she hit her head.  Paramedics came and took her to the emergency department.  They reported mild tremor after her fall.  Patient reports she has a mild posterior headache.  She also reports some left low back pain.  No numbness or tingling.  No weakness.  No nausea or vomiting.  No chest pain.  No shortness of breath.  No other symptoms.   Past Medical History Past Medical History:  Diagnosis Date   Cervicalgia    Chest pain at rest 01/07/2012   Displacement of cervical intervertebral disc without myelopathy    Diverticulosis of colon (without mention of hemorrhage)    Female stress incontinence    GERD (gastroesophageal reflux disease)    no peds occasional pepcid   Inguinal hernia without mention of obstruction or gangrene, unilateral or unspecified, (not specified as recurrent)    Internal hemorrhoids without mention of complication    Lumbago    Macular degeneration (senile) of retina, unspecified    Osteoarthrosis, unspecified whether generalized or localized, unspecified site    Other and unspecified hyperlipidemia    Pain in joint, hand    Palpitations    Reflux esophagitis    Scoliosis (and kyphoscoliosis), idiopathic    Senile osteoporosis    Skin disorder    Thyroid disease    TIA (transient ischemic attack)    Unruptured popliteal cyst 06/24/2014   Right knee    Unspecified essential hypertension    Unspecified glaucoma(365.9)    Unspecified hypothyroidism     Unspecified vitamin D deficiency    Patient Active Problem List   Diagnosis Date Noted   Dysuria 06/28/2021   Right groin hernia 03/10/2019   Macular degeneration of both eyes 03/10/2019   Senile osteoporosis 03/10/2019   Transient cerebral ischemia 03/10/2019   S/P right inguinal hernia repair 04/24/2018   Contusion, hip 03/20/2018   Overactive bladder 02/25/2018   Chronic venous insufficiency 10/15/2017   Chronic cystitis 09/19/2017   Unspecified glaucoma 09/12/2017   S/P bilateral inguinal hernia repair 08/03/2017   Advance care planning 02/01/2017   Right hip pain 08/29/2016   Pain of right sacroiliac joint 06/27/2016   Edema 03/08/2016   Right shoulder pain 03/10/2015   Corn 12/30/2014   Unruptured popliteal cyst 06/24/2014   Female stress incontinence    Inguinal hernia 12/24/2013   Balance problem 12/24/2013   Brain aneurysm 05/27/2013   TIA (transient ischemic attack) 05/02/2013   GERD (gastroesophageal reflux disease) 03/24/2013   Hypothyroidism    Unspecified vitamin D deficiency    Macular degeneration    Unspecified glaucoma(365.9)    Essential hypertension    Low back pain radiating to left lower extremity    Hyperlipidemia    Home Medication(s) Prior to Admission medications   Medication Sig Start Date End Date Taking? Authorizing Provider  acetaminophen (TYLENOL) 500 MG tablet Take 500 mg by mouth 2 (two) times daily as needed for mild pain or headache.     [provider]  bimatoprost (LUMIGAN) 0.01 % SOLN Place 1 drop into both eyes at bedtime.    [provider]  Calcium Carbonate-Vitamin D 600-400 MG-UNIT chew tablet Chew 1 tablet by mouth 2 (two) times daily.     [provider]  camphor-menthol Timoteo Ace) lotion Apply 1 application topically as needed for itching. Prescribed by Dermatology at [facility]    [provider]  cephALEXin (KEFLEX) 250 MG capsule Take 250 mg by mouth daily.    [provider]   Cholecalciferol (VITAMIN D3) 50 MCG (2000 UT) TABS Take by mouth.    [provider]  clopidogrel (PLAVIX) 75 MG tablet TAKE ONE TABLET BY MOUTH DAILY 07/03/22   Wardell Honour, MD  CRANBERRY EXTRACT PO Take 2 tablets by mouth daily as needed.    [provider]  famotidine (PEPCID AC) 10 MG chewable tablet Chew 10 mg by mouth at bedtime as needed for heartburn.    [provider]  hydrocortisone cream 1 % Apply 1 application topically daily as needed for itching.     [provider]  metoprolol succinate (TOPROL-XL) 25 MG 24 hr tablet Take one tablet by mouth once daily to regulate heart and control blood pressure. 04/24/22   Lauree Chandler, NP  Multiple Vitamins-Minerals (PRESERVISION AREDS 2 PO) Take 1 capsule by mouth 2 (two) times daily.     [provider]  Polyethyl Glycol-Propyl Glycol 0.4-0.3 % SOLN Place 1 vial into both eyes 2 (two) times daily. May use another dose during the day as needed for dry eye    [provider]  psyllium (METAMUCIL) 58.6 % packet Take 1 packet by mouth daily. Mix with water    [provider]  vitamin C (ASCORBIC ACID) 500 MG tablet Take 500 mg by mouth daily with supper.     [provider]                                                                                                                                    Past Surgical History Past Surgical History:  Procedure Laterality Date   ABDOMINAL HYSTERECTOMY  1975   Dr Mallie Mussel   APPENDECTOMY  1988   cardiolite myocardial perfusion study     DOPPLER ECHOCARDIOGRAPHY     EYE SURGERY Bilateral 2009   cataract removed right eye, Dr Charise Killian   FOOT SURGERY  august 2013   Hewitt, MD   INGUINAL HERNIA REPAIR Bilateral    w/mesh   INGUINAL HERNIA REPAIR Left 08/03/2017   Procedure: LAPAROSCOPIC LEFT INGUINAL HERNIA REPAIR WITH MESH;  Surgeon: Greer Pickerel, MD;  Location: Home;  Service: General;  Laterality: Left;   INGUINAL  HERNIA REPAIR Right 08/03/2017   Procedure: HERNIA REPAIR RIGHT INGUINAL ADULT WITH MESH;  Surgeon: Greer Pickerel, MD;  Location: [facility];  Service: General;  Laterality: Right;   INGUINAL HERNIA REPAIR     Dr. Redmond Pulling  04-24-18   INGUINAL HERNIA REPAIR Right 04/24/2018   Procedure: DIAGNOSTIC LAPAROSCOPIC, OPENREPAIR OF RECURRENT RIGHT INGUINAL HERNIA WITH MESH ERAS PATHWAY;  Surgeon: Greer Pickerel, MD;  Location: [facility];  Service: General;  Laterality: Right;   INSERTION OF MESH Bilateral 08/03/2017   Procedure: INSERTION OF MESH;  Surgeon: Greer Pickerel, MD;  Location: [facility];  Service: General;  Laterality: Bilateral;   NM MYOVIEW LTD     negative   TONSILLECTOMY  1937   TOOTH EXTRACTION  09/16/13   Dr Carlos American   Family History Family History  Problem Relation Age of Onset   CAD Mother    Parkinson's disease Mother    Cancer Father        colon cancer   Parkinson's disease Father    Stroke Maternal Grandmother     Social History Social History   Tobacco Use   Smoking status: Never   Smokeless tobacco: Never  Vaping Use   Vaping Use: Never used  Substance Use Topics   Alcohol use: No   Drug use: No   Allergies Trimethoprim, Erythromycin, Macrodantin [nitrofurantoin macrocrystal], Penicillins, Sulfa antibiotics, and Latex  Review of Systems Review of Systems  All other systems reviewed and are negative.   Physical Exam Vital Signs  I have reviewed the triage vital signs BP (!) 180/82 (BP Location: Right Arm)   Pulse 74   Temp 98.7 F (37.1 C) (Oral)   Resp 14   Ht '5\' 4"'$  (1.626 m)   Wt 63.5 kg   SpO2 98%   BMI 24.03 kg/m  Physical Exam Vitals and nursing note reviewed.  Constitutional:      General: She is not in acute distress.    Appearance: She is well-developed.  HENT:     Head: Normocephalic and atraumatic.     Mouth/Throat:     Mouth: Mucous membranes are moist.  Eyes:     Pupils: Pupils are equal, round, and reactive to light.  Cardiovascular:      Rate and Rhythm: Normal rate and regular rhythm.     Heart sounds: No murmur heard. Pulmonary:     Effort: Pulmonary effort is normal. No respiratory distress.     Breath sounds: Normal breath sounds.  Abdominal:     General: Abdomen is flat.     Palpations: Abdomen is soft.     Tenderness: There is no abdominal tenderness.  Musculoskeletal:        General: No tenderness.     Right lower leg: No edema.     Left lower leg: No edema.     Comments: No midline C, T, L-spine tenderness.  No chest wall tenderness or crepitus.  Full painless range of motion at the bilateral upper extremities including the shoulders, elbows, wrists, hand and fingers, and in the bilateral lower extremities including the hips, knees, ankle, toes.  No focal bony tenderness, injury or deformity. Mild left low back paraspinal tenderness  Skin:    General: Skin is warm and dry.  Neurological:     General: No focal deficit present.     Mental Status: She is alert. Mental status is at baseline.     Comments: Ambulatory with walker, at baseline.  Strength 5 out of 5 in the bilateral upper and lower extremities.  Cranial nerves II through XII intact.  No appreciable tremor  Psychiatric:        Mood and Affect: Mood normal.        Behavior: Behavior normal.  ED Results and Treatments Labs (all labs ordered are listed, but only abnormal results are displayed) Labs Reviewed  BASIC METABOLIC PANEL - Abnormal; Notable for the following components:      Result Value   Glucose, Bld 100 (*)    All other components within normal limits  CBC WITH DIFFERENTIAL/PLATELET  PROTIME-INR                                                                                                                          Radiology CT Head Wo Contrast  Result Date: 07/26/2022 CLINICAL DATA:  Trip and fall, headache and dizziness EXAM: CT HEAD WITHOUT CONTRAST CT CERVICAL SPINE WITHOUT CONTRAST TECHNIQUE: Multidetector CT imaging of the  head and cervical spine was performed following the standard protocol without intravenous contrast. Multiplanar CT image reconstructions of the cervical spine were also generated. RADIATION DOSE REDUCTION: This exam was performed according to the departmental dose-optimization program which includes automated exposure control, adjustment of the mA and/or kV according to patient size and/or use of iterative reconstruction technique. COMPARISON:  None Available. FINDINGS: CT HEAD FINDINGS Brain: No evidence of acute infarction, hemorrhage, hydrocephalus, extra-axial collection or mass lesion/mass effect. Periventricular and deep white matter hypodensity. Mild global cerebral volume loss. Vascular: No hyperdense vessel or unexpected calcification. Skull: Normal. Negative for fracture or focal lesion. Sinuses/Orbits: No acute finding. Other: None. CT CERVICAL SPINE FINDINGS Alignment: Normal. Skull base and vertebrae: No acute fracture. No primary bone lesion or focal pathologic process. Soft tissues and spinal canal: No prevertebral fluid or swelling. No visible canal hematoma. Disc levels: Severe disc space height loss and osteophytosis throughout the cervical spine. Upper chest: Negative. Other: None. IMPRESSION: 1. No acute intracranial pathology. Small-vessel white matter disease and global cerebral volume loss. 2. No fracture or static subluxation of the cervical spine. 3. Severe multilevel cervical disc degenerative disease. Electronically Signed   By: Delanna Ahmadi M.D.   On: 07/26/2022 17:35   CT Cervical Spine Wo Contrast  Result Date: 07/26/2022 CLINICAL DATA:  Trip and fall, headache and dizziness EXAM: CT HEAD WITHOUT CONTRAST CT CERVICAL SPINE WITHOUT CONTRAST TECHNIQUE: Multidetector CT imaging of the head and cervical spine was performed following the standard protocol without intravenous contrast. Multiplanar CT image reconstructions of the cervical spine were also generated. RADIATION DOSE  REDUCTION: This exam was performed according to the departmental dose-optimization program which includes automated exposure control, adjustment of the mA and/or kV according to patient size and/or use of iterative reconstruction technique. COMPARISON:  None Available. FINDINGS: CT HEAD FINDINGS Brain: No evidence of acute infarction, hemorrhage, hydrocephalus, extra-axial collection or mass lesion/mass effect. Periventricular and deep white matter hypodensity. Mild global cerebral volume loss. Vascular: No hyperdense vessel or unexpected calcification. Skull: Normal. Negative for fracture or focal lesion. Sinuses/Orbits: No acute finding. Other: None. CT CERVICAL SPINE FINDINGS Alignment: Normal. Skull base and vertebrae: No acute fracture. No primary bone lesion or focal pathologic process. Soft tissues and spinal canal: No  prevertebral fluid or swelling. No visible canal hematoma. Disc levels: Severe disc space height loss and osteophytosis throughout the cervical spine. Upper chest: Negative. Other: None. IMPRESSION: 1. No acute intracranial pathology. Small-vessel white matter disease and global cerebral volume loss. 2. No fracture or static subluxation of the cervical spine. 3. Severe multilevel cervical disc degenerative disease. Electronically Signed   By: Delanna Ahmadi M.D.   On: 07/26/2022 17:35    Pertinent labs & imaging results that were available during my care of the patient were reviewed by me and considered in my medical decision making (see MDM for details).  Medications Ordered in ED Medications  acetaminophen (TYLENOL) tablet 1,000 mg (1,000 mg Oral Given 07/26/22 1907)                                                                                                                                     Procedures Procedures  (including critical care time)  Medical Decision Making / ED Course   MDM:  89 year old female presenting to the emergency department with fall after using her  walker to go too quickly.  Patient well-appearing in the emergency department.  Physical exam without evidence of traumatic injury, with exception of very mild lumbar paraspinal tenderness/left hip tenderness with absolutely no midline tenderness or painful range of motion of hip.  Suspect mild strain.  Her symptoms improved with Tylenol.   Patient able to ambulate without difficulty and reports she feels at her baseline. Extremely low concern for occult fracture. EMS noted possible tremor although on my evaluation patient has no tremor.  Possibly had tremor after excitement of falling.  No prodromal symptoms to suggest syncope as cause of fall, fall reportedly witnessed by nursing home staff.  Laboratory testing reassuring.  Will discharge patient to home. All questions answered. Patient comfortable with plan of discharge. Return precautions discussed with patient and specified on the after visit summary.       Additional history obtained: -Additional history obtained from family and ems -External records from outside source obtained and reviewed including: Chart review including previous notes, labs, imaging, consultation notes including PMD visit 03/08/22   Lab Tests: -I ordered, reviewed, and interpreted labs.   The pertinent results include:   Labs Reviewed  BASIC METABOLIC PANEL - Abnormal; Notable for the following components:      Result Value   Glucose, Bld 100 (*)    All other components within normal limits  CBC WITH DIFFERENTIAL/PLATELET  PROTIME-INR    Notable for normal albs  EKG   EKG Interpretation  Date/Time:  Wednesday July 26 2022 17:00:06 EDT Ventricular Rate:  76 PR Interval:  172 QRS Duration: 87 QT Interval:  408 QTC Calculation: 459 R Axis:   -8 Text Interpretation: Sinus rhythm Abnormal R-wave progression, early transition LVH by voltage Confirmed by Garnette Gunner [phone]) on 07/26/2022 5:15:14 PM         Imaging Studies  ordered: I ordered  imaging studies including CT head and neck On my interpretation imaging demonstrates no fracture, no intracranial process I independently visualized and interpreted imaging. I agree with the radiologist interpretation   Medicines ordered and prescription drug management: Meds ordered this encounter  Medications   acetaminophen (TYLENOL) tablet 1,000 mg    -I have reviewed the patients home medicines and have made adjustments as needed   Reevaluation: After the interventions noted above, I reevaluated the patient and found that their symptoms have resolved  Co morbidities that complicate the patient evaluation  Past Medical History:  Diagnosis Date   Cervicalgia    Chest pain at rest 01/07/2012   Displacement of cervical intervertebral disc without myelopathy    Diverticulosis of colon (without mention of hemorrhage)    Female stress incontinence    GERD (gastroesophageal reflux disease)    no peds occasional pepcid   Inguinal hernia without mention of obstruction or gangrene, unilateral or unspecified, (not specified as recurrent)    Internal hemorrhoids without mention of complication    Lumbago    Macular degeneration (senile) of retina, unspecified    Osteoarthrosis, unspecified whether generalized or localized, unspecified site    Other and unspecified hyperlipidemia    Pain in joint, hand    Palpitations    Reflux esophagitis    Scoliosis (and kyphoscoliosis), idiopathic    Senile osteoporosis    Skin disorder    Thyroid disease    TIA (transient ischemic attack)    Unruptured popliteal cyst 06/24/2014   Right knee    Unspecified essential hypertension    Unspecified glaucoma(365.9)    Unspecified hypothyroidism    Unspecified vitamin D deficiency       Dispostion: Disposition decision including need for hospitalization was considered, and patient discharged from emergency department.    Final Clinical Impression(s) / ED Diagnoses Final diagnoses:   Contusion of left hip, initial encounter     This chart was dictated using voice recognition software.  Despite best efforts to proofread,  errors can occur which can change the documentation meaning.    Cristie Hem, MD 07/26/22 2059

## 2022-07-27 DIAGNOSIS — Z9181 History of falling: Secondary | ICD-10-CM | POA: Diagnosis not present

## 2022-07-27 DIAGNOSIS — M6281 Muscle weakness (generalized): Secondary | ICD-10-CM | POA: Diagnosis not present

## 2022-07-27 DIAGNOSIS — R278 Other lack of coordination: Secondary | ICD-10-CM | POA: Diagnosis not present

## 2022-07-27 DIAGNOSIS — R2681 Unsteadiness on feet: Secondary | ICD-10-CM | POA: Diagnosis not present

## 2022-07-31 ENCOUNTER — Encounter: Payer: Self-pay | Admitting: Orthopedic Surgery

## 2022-07-31 ENCOUNTER — Non-Acute Institutional Stay: Payer: Medicare Other | Admitting: Orthopedic Surgery

## 2022-07-31 VITALS — BP 143/83 | HR 73 | Temp 97.8°F | Resp 18

## 2022-07-31 DIAGNOSIS — Z9181 History of falling: Secondary | ICD-10-CM | POA: Diagnosis not present

## 2022-07-31 DIAGNOSIS — W19XXXD Unspecified fall, subsequent encounter: Secondary | ICD-10-CM

## 2022-07-31 DIAGNOSIS — R2681 Unsteadiness on feet: Secondary | ICD-10-CM | POA: Diagnosis not present

## 2022-07-31 DIAGNOSIS — R278 Other lack of coordination: Secondary | ICD-10-CM | POA: Diagnosis not present

## 2022-07-31 DIAGNOSIS — S7002XD Contusion of left hip, subsequent encounter: Secondary | ICD-10-CM | POA: Diagnosis not present

## 2022-07-31 DIAGNOSIS — M6281 Muscle weakness (generalized): Secondary | ICD-10-CM | POA: Diagnosis not present

## 2022-07-31 DIAGNOSIS — I10 Essential (primary) hypertension: Secondary | ICD-10-CM

## 2022-07-31 NOTE — Progress Notes (Signed)
Location:  [place] of Service:  Clinic (12) Provider:  Yvonna Alanis, NP   Wardell Honour, MD  Patient Care Team: Wardell Honour, MD as PCP - General (Family Medicine) Lorretta Harp, MD as PCP - Cardiology (Cardiology) Laurence Spates, MD (Inactive) as Consulting Physician (Gastroenterology) Myrlene Broker, MD as Attending Physician (Urology) Lorretta Harp, MD as Consulting Physician (Cardiology) Rozetta Nunnery, MD (Inactive) as Consulting Physician (Otolaryngology) Richarda Osmond, [place] (Dentistry) Shon Hough, MD as Consulting Physician (Ophthalmology) Iran Planas, MD as Consulting Physician (Orthopedic Surgery) Greer Pickerel, MD as Consulting Physician (General Surgery)  Extended Emergency Contact Information Primary Emergency Contact: [name] Address: [address]          [place], [place] [address] [name] of [place] Phone: [phone] Work Phone: [phone] Mobile Phone: [phone] Relation: Daughter Secondary Emergency Contact: Arvin Collard Address: [address] [place] [address] [name] of [place] Phone: [phone] Mobile Phone: [phone] Relation: Son  Code Status:  Full code Goals of care: Advanced Directive information    07/31/2022   11:27 AM  Advanced Directives  Does Patient Have a Medical Advance Directive? Yes  Type of Paramedic of [place];Living will;Out of facility DNR (pink MOST or yellow form)  Does patient want to make changes to medical advance directive? No - Patient declined  Copy of [place] in Chart? Yes - validated most recent copy scanned in chart (See row information)     Chief Complaint  Patient presents with   Transitions Of Care    [facility] 07/26/2022    HPI:  Pt is a 89 y.o. female seen today f/u s/p ED evaluation at [facility] 03/13.   03/13 mechanical fall. Unclear if she hit  her head. She did c/o headache  and low back pain after incident. EMS also noted mild tremor. WBC 7.0, hgb 13.6, hct 41.0, platelets 207, glucose 100, Na+ 139, K+ 4.2, BUN/creat 18/0.74, CO2 27,  and calcium 9.8. CT head with no acute findings. CT spine no fracture or static subluxation. Left hip tenderness and increased pain with ROM> improved with tylenol.   Today, she denies pain to left hip or back. No recent headaches, tremor or N/V. She has not taken any tylenol in the past 2 days. She did use ice to left hip a few times. No other falls/injuries at this time. Ambulates with rolator. Afebrile. Vitals stable.    Past Medical History:  Diagnosis Date   Cervicalgia    Chest pain at rest 01/07/2012   Displacement of cervical intervertebral disc without myelopathy    Diverticulosis of colon (without mention of hemorrhage)    Female stress incontinence    GERD (gastroesophageal reflux disease)    no peds occasional pepcid   Inguinal hernia without mention of obstruction or gangrene, unilateral or unspecified, (not specified as recurrent)    Internal hemorrhoids without mention of complication    Lumbago    Macular degeneration (senile) of retina, unspecified    Osteoarthrosis, unspecified whether generalized or localized, unspecified site    Other and unspecified hyperlipidemia    Pain in joint, hand    Palpitations    Reflux esophagitis    Scoliosis (and kyphoscoliosis), idiopathic    Senile osteoporosis    Skin disorder    Thyroid disease    TIA (transient ischemic attack)    Unruptured popliteal  cyst 06/24/2014   Right knee    Unspecified essential hypertension    Unspecified glaucoma(365.9)    Unspecified hypothyroidism    Unspecified vitamin D deficiency    Past Surgical History:  Procedure Laterality Date   ABDOMINAL HYSTERECTOMY  1975   Dr Mallie Mussel   APPENDECTOMY  1988   cardiolite myocardial perfusion study     DOPPLER ECHOCARDIOGRAPHY     EYE SURGERY Bilateral 2009    cataract removed right eye, Dr Charise Killian   FOOT SURGERY  august 2013   Hewitt, MD   INGUINAL HERNIA REPAIR Bilateral    w/mesh   INGUINAL HERNIA REPAIR Left 08/03/2017   Procedure: LAPAROSCOPIC LEFT INGUINAL HERNIA REPAIR WITH MESH;  Surgeon: Greer Pickerel, MD;  Location: [facility];  Service: General;  Laterality: Left;   INGUINAL HERNIA REPAIR Right 08/03/2017   Procedure: HERNIA REPAIR RIGHT INGUINAL ADULT WITH MESH;  Surgeon: Greer Pickerel, MD;  Location: [facility];  Service: General;  Laterality: Right;   INGUINAL HERNIA REPAIR     Dr. Redmond Pulling 04-24-18   INGUINAL HERNIA REPAIR Right 04/24/2018   Procedure: DIAGNOSTIC LAPAROSCOPIC, OPENREPAIR OF RECURRENT RIGHT INGUINAL HERNIA WITH MESH ERAS PATHWAY;  Surgeon: Greer Pickerel, MD;  Location: [facility];  Service: General;  Laterality: Right;   INSERTION OF MESH Bilateral 08/03/2017   Procedure: INSERTION OF MESH;  Surgeon: Greer Pickerel, MD;  Location: [facility];  Service: General;  Laterality: Bilateral;   NM MYOVIEW LTD     negative   TONSILLECTOMY  1937   TOOTH EXTRACTION  09/16/13   Dr Carlos American    Allergies  Allergen Reactions   Trimethoprim Other (See Comments)    Headaches/ ear pressure    Erythromycin Other (See Comments)    UNSPECIFIED REACTION    Macrodantin [Nitrofurantoin Macrocrystal] Other (See Comments)    UNSPECIFIED REACTION    Penicillins Other (See Comments)    UNSPECIFIED REACTION  Has patient had a PCN reaction causing immediate rash, facial/tongue/throat swelling, SOB or lightheadedness with hypotension: Unknown Has patient had a PCN reaction causing severe rash involving mucus membranes or skin necrosis: Unknown Has patient had a PCN reaction that required hospitalization: No Has patient had a PCN reaction occurring within the last 10 years: No If all of the above answers are "NO", then may proceed with Cephalosporin use.   Sulfa Antibiotics Other (See Comments)    UNSPECIFIED REACTION    Latex Itching    Outpatient Encounter  Medications as of 07/31/2022  Medication Sig   acetaminophen (TYLENOL) 500 MG tablet Take 500 mg by mouth 2 (two) times daily as needed for mild pain or headache.    bimatoprost (LUMIGAN) 0.01 % SOLN Place 1 drop into both eyes at bedtime.   Calcium Carbonate-Vitamin D 600-400 MG-UNIT chew tablet Chew 1 tablet by mouth 2 (two) times daily.    camphor-menthol (SARNA) lotion Apply 1 application topically as needed for itching. Prescribed by Dermatology at [facility]   Cholecalciferol (VITAMIN D3) 50 MCG (2000 UT) TABS Take by mouth.   clopidogrel (PLAVIX) 75 MG tablet TAKE ONE TABLET BY MOUTH DAILY   CRANBERRY EXTRACT PO Take 2 tablets by mouth daily as needed.   famotidine (PEPCID AC) 10 MG chewable tablet Chew 10 mg by mouth at bedtime as needed for heartburn.   hydrocortisone cream 1 % Apply 1 application topically daily as needed for itching.    metoprolol succinate (TOPROL-XL) 25 MG 24 hr tablet Take one tablet by mouth once daily to regulate heart  and control blood pressure.   Multiple Vitamins-Minerals (PRESERVISION AREDS 2 PO) Take 1 capsule by mouth 2 (two) times daily.    Polyethyl Glycol-Propyl Glycol 0.4-0.3 % SOLN Place 1 vial into both eyes 2 (two) times daily. May use another dose during the day as needed for dry eye   psyllium (METAMUCIL) 58.6 % packet Take 1 packet by mouth daily. Mix with water   vitamin C (ASCORBIC ACID) 500 MG tablet Take 500 mg by mouth daily with supper.    [DISCONTINUED] cephALEXin (KEFLEX) 250 MG capsule Take 250 mg by mouth daily.   No facility-administered encounter medications on file as of 07/31/2022.    Review of Systems  Constitutional:  Negative for activity change, appetite change and fatigue.  Eyes:  Negative for visual disturbance.  Respiratory:  Negative for cough, shortness of breath and wheezing.   Cardiovascular:  Negative for chest pain and leg swelling.  Gastrointestinal:  Negative for nausea and vomiting.  Musculoskeletal:  Positive  for arthralgias and gait problem.  Neurological:  Negative for dizziness and headaches.  Psychiatric/Behavioral:  Negative for confusion and dysphoric mood. The patient is not nervous/anxious.     Immunization History  Administered Date(s) Administered   Fluad Quad(high Dose 65+) 02/04/2019, 03/01/2022   Influenza Whole 02/24/2011, 02/12/2012   Influenza, High Dose Seasonal PF 02/01/2017, 02/25/2018, 02/11/2020   Influenza,inj,Quad PF,6+ Mos 02/20/2013, 02/10/2014, 02/19/2015, 02/18/2016   Influenza-Unspecified 02/16/2021   Moderna Sars-Covid-2 Vaccination 05/19/2019, 06/16/2019, 03/29/2020, 09/27/2020   Pneumococcal Conjugate-13 06/24/2014   Pneumococcal Polysaccharide-23 04/12/1998   Td 02/21/1996, 07/23/2003   Tdap 03/20/2010   Zoster Recombinat (Shingrix) 09/19/2016, 12/02/2016   Zoster, Live 09/15/2005   Pertinent  Health Maintenance Due  Topic Date Due   INFLUENZA VACCINE  Completed   DEXA SCAN  Completed      04/26/2021    2:07 PM 07/06/2021    2:19 PM 07/08/2021    3:22 PM 03/08/2022    2:52 PM 07/31/2022   11:27 AM  Fall Risk  Falls in the past year? 1 0 0 0 1  Was there an injury with Fall? 0 0 0 0 1  Fall Risk Category Calculator 1 0 0 0 2  Fall Risk Category (Retired) Low Low Low Low   (RETIRED) Patient Fall Risk Level Low fall risk Low fall risk Low fall risk Low fall risk   Patient at Risk for Falls Due to History of fall(s) No Fall Risks No Fall Risks No Fall Risks Impaired balance/gait;Impaired mobility  Fall risk Follow up Falls evaluation completed;Education provided;Falls prevention discussed Falls evaluation completed Falls evaluation completed  Falls evaluation completed;Education provided;Falls prevention discussed   Functional Status Survey:    There were no vitals filed for this visit. There is no height or weight on file to calculate BMI. Physical Exam Vitals reviewed.  Constitutional:      General: She is not in acute distress. HENT:     Head:  Normocephalic and atraumatic.     Mouth/Throat:     Mouth: Mucous membranes are moist.  Eyes:     General:        Right eye: No discharge.        Left eye: No discharge.     Extraocular Movements: Extraocular movements intact.     Pupils: Pupils are equal, round, and reactive to light.  Cardiovascular:     Rate and Rhythm: Normal rate and regular rhythm.     Pulses: Normal pulses.     Heart sounds:  Normal heart sounds.  Pulmonary:     Effort: Pulmonary effort is normal. No respiratory distress.     Breath sounds: Normal breath sounds. No wheezing.  Abdominal:     General: Bowel sounds are normal.     Palpations: Abdomen is soft.  Musculoskeletal:     Cervical back: Neck supple.     Left hip: No deformity, tenderness or crepitus. Normal range of motion. Normal strength.     Right lower leg: Edema present.     Left lower leg: Edema present.     Comments: Right dorsiflexion 5/5, able to move extremities without difficulty, BLE non pitting edema  Skin:    General: Skin is warm and dry.     Capillary Refill: Capillary refill takes less than 2 seconds.  Neurological:     General: No focal deficit present.     Mental Status: She is alert and oriented to person, place, and time.     Motor: Weakness present.     Gait: Gait abnormal.     Comments: rolator  Psychiatric:        Mood and Affect: Mood normal.        Behavior: Behavior normal.     Labs reviewed: Recent Labs    03/20/22 0822 07/26/22 1715  NA 141 139  K 4.1 4.2  CL 105 102  CO2 29 27  GLUCOSE 87 100*  BUN 25 18  CREATININE 0.63 0.74  CALCIUM 9.5 9.8   Recent Labs    03/20/22 0822  AST 17  ALT 16  BILITOT 0.6  PROT 6.3   Recent Labs    07/26/22 1715  WBC 7.0  NEUTROABS 4.1  HGB 13.6  HCT 41.0  MCV 92.6  PLT 207   Lab Results  Component Value Date   TSH 3.87 06/23/2016   Lab Results  Component Value Date   HGBA1C 5.3 05/02/2013   Lab Results  Component Value Date   CHOL 188 07/10/2019    HDL 75 07/10/2019   LDLCALC 94 07/10/2019   TRIG 96 07/10/2019   CHOLHDL 2.5 07/10/2019    Significant Diagnostic Results in last 30 days:  CT Head Wo Contrast  Result Date: 07/26/2022 CLINICAL DATA:  Trip and fall, headache and dizziness EXAM: CT HEAD WITHOUT CONTRAST CT CERVICAL SPINE WITHOUT CONTRAST TECHNIQUE: Multidetector CT imaging of the head and cervical spine was performed following the standard protocol without intravenous contrast. Multiplanar CT image reconstructions of the cervical spine were also generated. RADIATION DOSE REDUCTION: This exam was performed according to the departmental dose-optimization program which includes automated exposure control, adjustment of the mA and/or kV according to patient size and/or use of iterative reconstruction technique. COMPARISON:  None Available. FINDINGS: CT HEAD FINDINGS Brain: No evidence of acute infarction, hemorrhage, hydrocephalus, extra-axial collection or mass lesion/mass effect. Periventricular and deep white matter hypodensity. Mild global cerebral volume loss. Vascular: No hyperdense vessel or unexpected calcification. Skull: Normal. Negative for fracture or focal lesion. Sinuses/Orbits: No acute finding. Other: None. CT CERVICAL SPINE FINDINGS Alignment: Normal. Skull base and vertebrae: No acute fracture. No primary bone lesion or focal pathologic process. Soft tissues and spinal canal: No prevertebral fluid or swelling. No visible canal hematoma. Disc levels: Severe disc space height loss and osteophytosis throughout the cervical spine. Upper chest: Negative. Other: None. IMPRESSION: 1. No acute intracranial pathology. Small-vessel white matter disease and global cerebral volume loss. 2. No fracture or static subluxation of the cervical spine. 3. Severe multilevel cervical disc degenerative  disease. Electronically Signed   By: Delanna Ahmadi M.D.   On: 07/26/2022 17:35   CT Cervical Spine Wo Contrast  Result Date:  07/26/2022 CLINICAL DATA:  Trip and fall, headache and dizziness EXAM: CT HEAD WITHOUT CONTRAST CT CERVICAL SPINE WITHOUT CONTRAST TECHNIQUE: Multidetector CT imaging of the head and cervical spine was performed following the standard protocol without intravenous contrast. Multiplanar CT image reconstructions of the cervical spine were also generated. RADIATION DOSE REDUCTION: This exam was performed according to the departmental dose-optimization program which includes automated exposure control, adjustment of the mA and/or kV according to patient size and/or use of iterative reconstruction technique. COMPARISON:  None Available. FINDINGS: CT HEAD FINDINGS Brain: No evidence of acute infarction, hemorrhage, hydrocephalus, extra-axial collection or mass lesion/mass effect. Periventricular and deep white matter hypodensity. Mild global cerebral volume loss. Vascular: No hyperdense vessel or unexpected calcification. Skull: Normal. Negative for fracture or focal lesion. Sinuses/Orbits: No acute finding. Other: None. CT CERVICAL SPINE FINDINGS Alignment: Normal. Skull base and vertebrae: No acute fracture. No primary bone lesion or focal pathologic process. Soft tissues and spinal canal: No prevertebral fluid or swelling. No visible canal hematoma. Disc levels: Severe disc space height loss and osteophytosis throughout the cervical spine. Upper chest: Negative. Other: None. IMPRESSION: 1. No acute intracranial pathology. Small-vessel white matter disease and global cerebral volume loss. 2. No fracture or static subluxation of the cervical spine. 3. Severe multilevel cervical disc degenerative disease. Electronically Signed   By: Delanna Ahmadi M.D.   On: 07/26/2022 17:35    Assessment/Plan 1. Contusion of left hip, subsequent encounter - 03/13 mechanical fall > ED evaluation - CT head no acute findings - CT spine no fracture or static subluxation - left hip pain resolved> exam unremarkable - cont tylenol prn -  advised to contact PCP if left hip pain increases   2. Fall, subsequent encounter - see above - denies dizziness or lightheadedness - hgb 13.6 07/26/2022 - advised to call PCP if falls become frequent> recommend PT evaluation  3. Essential hypertension - controlled, goal < 150/90 - BUN/creat 18/0.74 07/26/2022 - cont metoprolol   Family/ staff Communication: plan discussed with patient and nurse  Labs/tests ordered:  none

## 2022-07-31 NOTE — Patient Instructions (Signed)
Continue tylenol for pain  Please contact [facility] if left hip pain worsens or you feel more unstable on your feel

## 2022-08-03 DIAGNOSIS — Z9181 History of falling: Secondary | ICD-10-CM | POA: Diagnosis not present

## 2022-08-03 DIAGNOSIS — M6281 Muscle weakness (generalized): Secondary | ICD-10-CM | POA: Diagnosis not present

## 2022-08-03 DIAGNOSIS — R278 Other lack of coordination: Secondary | ICD-10-CM | POA: Diagnosis not present

## 2022-08-03 DIAGNOSIS — R2681 Unsteadiness on feet: Secondary | ICD-10-CM | POA: Diagnosis not present

## 2022-08-08 DIAGNOSIS — R278 Other lack of coordination: Secondary | ICD-10-CM | POA: Diagnosis not present

## 2022-08-08 DIAGNOSIS — Z9181 History of falling: Secondary | ICD-10-CM | POA: Diagnosis not present

## 2022-08-08 DIAGNOSIS — R2681 Unsteadiness on feet: Secondary | ICD-10-CM | POA: Diagnosis not present

## 2022-08-08 DIAGNOSIS — M6281 Muscle weakness (generalized): Secondary | ICD-10-CM | POA: Diagnosis not present

## 2022-08-09 DIAGNOSIS — M6281 Muscle weakness (generalized): Secondary | ICD-10-CM | POA: Diagnosis not present

## 2022-08-09 DIAGNOSIS — R2681 Unsteadiness on feet: Secondary | ICD-10-CM | POA: Diagnosis not present

## 2022-08-09 DIAGNOSIS — Z9181 History of falling: Secondary | ICD-10-CM | POA: Diagnosis not present

## 2022-08-09 DIAGNOSIS — R278 Other lack of coordination: Secondary | ICD-10-CM | POA: Diagnosis not present

## 2022-08-10 DIAGNOSIS — M6281 Muscle weakness (generalized): Secondary | ICD-10-CM | POA: Diagnosis not present

## 2022-08-10 DIAGNOSIS — Z9181 History of falling: Secondary | ICD-10-CM | POA: Diagnosis not present

## 2022-08-10 DIAGNOSIS — R278 Other lack of coordination: Secondary | ICD-10-CM | POA: Diagnosis not present

## 2022-08-10 DIAGNOSIS — R2681 Unsteadiness on feet: Secondary | ICD-10-CM | POA: Diagnosis not present

## 2022-08-14 DIAGNOSIS — R41841 Cognitive communication deficit: Secondary | ICD-10-CM | POA: Diagnosis not present

## 2022-08-14 DIAGNOSIS — M6281 Muscle weakness (generalized): Secondary | ICD-10-CM | POA: Diagnosis not present

## 2022-08-14 DIAGNOSIS — Z9181 History of falling: Secondary | ICD-10-CM | POA: Diagnosis not present

## 2022-08-14 DIAGNOSIS — H3530 Unspecified macular degeneration: Secondary | ICD-10-CM | POA: Diagnosis not present

## 2022-08-14 DIAGNOSIS — G459 Transient cerebral ischemic attack, unspecified: Secondary | ICD-10-CM | POA: Diagnosis not present

## 2022-08-14 DIAGNOSIS — R278 Other lack of coordination: Secondary | ICD-10-CM | POA: Diagnosis not present

## 2022-08-14 DIAGNOSIS — R2681 Unsteadiness on feet: Secondary | ICD-10-CM | POA: Diagnosis not present

## 2022-08-15 DIAGNOSIS — R2681 Unsteadiness on feet: Secondary | ICD-10-CM | POA: Diagnosis not present

## 2022-08-15 DIAGNOSIS — M6281 Muscle weakness (generalized): Secondary | ICD-10-CM | POA: Diagnosis not present

## 2022-08-15 DIAGNOSIS — Z9181 History of falling: Secondary | ICD-10-CM | POA: Diagnosis not present

## 2022-08-15 DIAGNOSIS — G459 Transient cerebral ischemic attack, unspecified: Secondary | ICD-10-CM | POA: Diagnosis not present

## 2022-08-15 DIAGNOSIS — H3530 Unspecified macular degeneration: Secondary | ICD-10-CM | POA: Diagnosis not present

## 2022-08-15 DIAGNOSIS — R278 Other lack of coordination: Secondary | ICD-10-CM | POA: Diagnosis not present

## 2022-08-16 DIAGNOSIS — H3530 Unspecified macular degeneration: Secondary | ICD-10-CM | POA: Diagnosis not present

## 2022-08-16 DIAGNOSIS — M6281 Muscle weakness (generalized): Secondary | ICD-10-CM | POA: Diagnosis not present

## 2022-08-16 DIAGNOSIS — Z9181 History of falling: Secondary | ICD-10-CM | POA: Diagnosis not present

## 2022-08-16 DIAGNOSIS — R278 Other lack of coordination: Secondary | ICD-10-CM | POA: Diagnosis not present

## 2022-08-16 DIAGNOSIS — G459 Transient cerebral ischemic attack, unspecified: Secondary | ICD-10-CM | POA: Diagnosis not present

## 2022-08-16 DIAGNOSIS — R2681 Unsteadiness on feet: Secondary | ICD-10-CM | POA: Diagnosis not present

## 2022-08-17 DIAGNOSIS — H353221 Exudative age-related macular degeneration, left eye, with active choroidal neovascularization: Secondary | ICD-10-CM | POA: Diagnosis not present

## 2022-08-18 DIAGNOSIS — M6281 Muscle weakness (generalized): Secondary | ICD-10-CM | POA: Diagnosis not present

## 2022-08-18 DIAGNOSIS — R278 Other lack of coordination: Secondary | ICD-10-CM | POA: Diagnosis not present

## 2022-08-18 DIAGNOSIS — Z9181 History of falling: Secondary | ICD-10-CM | POA: Diagnosis not present

## 2022-08-18 DIAGNOSIS — R2681 Unsteadiness on feet: Secondary | ICD-10-CM | POA: Diagnosis not present

## 2022-08-18 DIAGNOSIS — H3530 Unspecified macular degeneration: Secondary | ICD-10-CM | POA: Diagnosis not present

## 2022-08-18 DIAGNOSIS — G459 Transient cerebral ischemic attack, unspecified: Secondary | ICD-10-CM | POA: Diagnosis not present

## 2022-08-21 DIAGNOSIS — H3530 Unspecified macular degeneration: Secondary | ICD-10-CM | POA: Diagnosis not present

## 2022-08-21 DIAGNOSIS — Z9181 History of falling: Secondary | ICD-10-CM | POA: Diagnosis not present

## 2022-08-21 DIAGNOSIS — R2681 Unsteadiness on feet: Secondary | ICD-10-CM | POA: Diagnosis not present

## 2022-08-21 DIAGNOSIS — M6281 Muscle weakness (generalized): Secondary | ICD-10-CM | POA: Diagnosis not present

## 2022-08-21 DIAGNOSIS — R278 Other lack of coordination: Secondary | ICD-10-CM | POA: Diagnosis not present

## 2022-08-21 DIAGNOSIS — G459 Transient cerebral ischemic attack, unspecified: Secondary | ICD-10-CM | POA: Diagnosis not present

## 2022-08-22 DIAGNOSIS — G459 Transient cerebral ischemic attack, unspecified: Secondary | ICD-10-CM | POA: Diagnosis not present

## 2022-08-22 DIAGNOSIS — Z9181 History of falling: Secondary | ICD-10-CM | POA: Diagnosis not present

## 2022-08-22 DIAGNOSIS — M6281 Muscle weakness (generalized): Secondary | ICD-10-CM | POA: Diagnosis not present

## 2022-08-22 DIAGNOSIS — H3530 Unspecified macular degeneration: Secondary | ICD-10-CM | POA: Diagnosis not present

## 2022-08-22 DIAGNOSIS — R2681 Unsteadiness on feet: Secondary | ICD-10-CM | POA: Diagnosis not present

## 2022-08-22 DIAGNOSIS — R278 Other lack of coordination: Secondary | ICD-10-CM | POA: Diagnosis not present

## 2022-08-23 ENCOUNTER — Non-Acute Institutional Stay (INDEPENDENT_AMBULATORY_CARE_PROVIDER_SITE_OTHER): Payer: Medicare Other | Admitting: Family Medicine

## 2022-08-23 ENCOUNTER — Encounter: Payer: Self-pay | Admitting: Family Medicine

## 2022-08-23 VITALS — BP 132/80 | HR 64 | Temp 96.6°F

## 2022-08-23 DIAGNOSIS — I10 Essential (primary) hypertension: Secondary | ICD-10-CM

## 2022-08-23 DIAGNOSIS — H3530 Unspecified macular degeneration: Secondary | ICD-10-CM | POA: Diagnosis not present

## 2022-08-23 DIAGNOSIS — E782 Mixed hyperlipidemia: Secondary | ICD-10-CM | POA: Diagnosis not present

## 2022-08-23 NOTE — Progress Notes (Signed)
Provider:  Jacalyn LefevreStephen Franklin Baumbach, MD  Careteam: Patient Care Team: Frederica KusterMiller, Pricilla Moehle M, MD as PCP - General (Family Medicine) Runell GessBerry, Jonathan J, MD as PCP - Cardiology (Cardiology) Carman ChingEdwards, James, MD (Inactive) as Consulting Physician (Gastroenterology) Esaw Daceavis, Ronald L III, MD as Attending Physician (Urology) Runell GessBerry, Jonathan J, MD as Consulting Physician (Cardiology) Drema HalonNewman, Christopher E, MD (Inactive) as Consulting Physician (Otolaryngology) Elizebeth KollerMackler, Milcah Dulany, DDS (Dentistry) Mckinley JewelStoneburner, Sara, MD as Consulting Physician (Ophthalmology) Bradly Bienenstockrtmann, Fred, MD as Consulting Physician (Orthopedic Surgery) Gaynelle AduWilson, Eric, MD as Consulting Physician (General Surgery)  [name][facility]  [facility]  Advanced Directive information    Allergies  Allergen Reactions   Trimethoprim Other (See Comments)    Headaches/ ear pressure    Erythromycin Other (See Comments)    UNSPECIFIED REACTION    Macrodantin [Nitrofurantoin Macrocrystal] Other (See Comments)    UNSPECIFIED REACTION    Penicillins Other (See Comments)    UNSPECIFIED REACTION  Has patient had a PCN reaction causing immediate rash, facial/tongue/throat swelling, SOB or lightheadedness with hypotension: Unknown Has patient had a PCN reaction causing severe rash involving mucus membranes or skin necrosis: Unknown Has patient had a PCN reaction that required hospitalization: No Has patient had a PCN reaction occurring within the last 10 years: No If all of the above answers are "NO", then may proceed with Cephalosporin use.   Sulfa Antibiotics Other (See Comments)    UNSPECIFIED REACTION    Latex Itching    No chief complaint on file.    HPI: Patient is a 89 y.o. female.  Female is a resident at [facility] when asked independent living.  She presents today with her daughter for discussion about moving to assisted living.  It has been suggested by staff at [facility] when asked if this is appropriate for her.  She is very reluctant  to do so.  There are times when she needs help with bathing and dressing.  Her mind is good and she can remember to take her medicines generally.  She has been affected by frequent urinary tract infections.  Only prescription medicines are metoprolol and Plavix I have a FL 2 which will complete today and submit to the folks at [facility].  Apparently she has a scheduled tour of the assisted living and will plan to move in the near future.  Review of Systems:  Review of Systems  Constitutional: Negative.   Respiratory: Negative.    Cardiovascular:  Positive for leg swelling.  Musculoskeletal: Negative.   Neurological: Negative.   Psychiatric/Behavioral: Negative.    All other systems reviewed and are negative.   Past Medical History:  Diagnosis Date   Cervicalgia    Chest pain at rest 01/07/2012   Displacement of cervical intervertebral disc without myelopathy    Diverticulosis of colon (without mention of hemorrhage)    Female stress incontinence    GERD (gastroesophageal reflux disease)    no peds occasional pepcid   Inguinal hernia without mention of obstruction or gangrene, unilateral or unspecified, (not specified as recurrent)    Internal hemorrhoids without mention of complication    Lumbago    Macular degeneration (senile) of retina, unspecified    Osteoarthrosis, unspecified whether generalized or localized, unspecified site    Other and unspecified hyperlipidemia    Pain in joint, hand    Palpitations    Reflux esophagitis    Scoliosis (and kyphoscoliosis), idiopathic    Senile osteoporosis    Skin disorder    Thyroid disease  TIA (transient ischemic attack)    Unruptured popliteal cyst 06/24/2014   Right knee    Unspecified essential hypertension    Unspecified glaucoma(365.9)    Unspecified hypothyroidism    Unspecified vitamin D deficiency    Past Surgical History:  Procedure Laterality Date   ABDOMINAL HYSTERECTOMY  1975   Dr Dewaine Conger   APPENDECTOMY  1988    cardiolite myocardial perfusion study     DOPPLER ECHOCARDIOGRAPHY     EYE SURGERY Bilateral 2009   cataract removed right eye, Dr Emmit Pomfret   FOOT SURGERY  august 2013   Hewitt, MD   INGUINAL HERNIA REPAIR Bilateral    w/mesh   INGUINAL HERNIA REPAIR Left 08/03/2017   Procedure: LAPAROSCOPIC LEFT INGUINAL HERNIA REPAIR WITH MESH;  Surgeon: Gaynelle Adu, MD;  Location: [facility] OR;  Service: General;  Laterality: Left;   INGUINAL HERNIA REPAIR Right 08/03/2017   Procedure: HERNIA REPAIR RIGHT INGUINAL ADULT WITH MESH;  Surgeon: Gaynelle Adu, MD;  Location: [facility] OR;  Service: General;  Laterality: Right;   INGUINAL HERNIA REPAIR     Dr. Andrey Campanile 04-24-18   INGUINAL HERNIA REPAIR Right 04/24/2018   Procedure: DIAGNOSTIC LAPAROSCOPIC, OPENREPAIR OF RECURRENT RIGHT INGUINAL HERNIA WITH MESH ERAS PATHWAY;  Surgeon: Gaynelle Adu, MD;  Location: [facility] ORS;  Service: General;  Laterality: Right;   INSERTION OF MESH Bilateral 08/03/2017   Procedure: INSERTION OF MESH;  Surgeon: Gaynelle Adu, MD;  Location: [facility] OR;  Service: General;  Laterality: Bilateral;   NM MYOVIEW LTD     negative   TONSILLECTOMY  1937   TOOTH EXTRACTION  09/16/13   Dr Randel Books   Social History:   reports that she has never smoked. She has never used smokeless tobacco. She reports that she does not drink alcohol and does not use drugs.  Family History  Problem Relation Age of Onset   CAD Mother    Parkinson's disease Mother    Cancer Father        colon cancer   Parkinson's disease Father    Stroke Maternal Grandmother     Medications: Patient's Medications  New Prescriptions   No medications on file  Previous Medications   ACETAMINOPHEN (TYLENOL) 500 MG TABLET    Take 500 mg by mouth 2 (two) times daily as needed for mild pain or headache.    BIMATOPROST (LUMIGAN) 0.01 % SOLN    Place 1 drop into both eyes at bedtime.   CALCIUM CARBONATE-VITAMIN D 600-400 MG-UNIT CHEW TABLET    Chew 1 tablet by mouth 2 (two) times daily.    CAMPHOR-MENTHOL (SARNA) LOTION    Apply 1 application topically as needed for itching. Prescribed by Dermatology at [facility]   CEPHALEXIN (KEFLEX) 250 MG CAPSULE    Take 250 mg by mouth daily.   CHOLECALCIFEROL (VITAMIN D3) 50 MCG (2000 UT) TABS    Take by mouth.   CLOPIDOGREL (PLAVIX) 75 MG TABLET    TAKE ONE TABLET BY MOUTH DAILY   CRANBERRY-VITAMIN C-PROBIOTIC (AZO CRANBERRY PO)    Take 1 tablet by mouth daily.   FAMOTIDINE (PEPCID AC) 10 MG CHEWABLE TABLET    Chew 10 mg by mouth at bedtime as needed for heartburn.   HYDROCORTISONE CREAM 1 %    Apply 1 application topically daily as needed for itching.    METOPROLOL SUCCINATE (TOPROL-XL) 25 MG 24 HR TABLET    Take one tablet by mouth once daily to regulate heart and control blood pressure.   MULTIPLE VITAMINS-MINERALS (PRESERVISION  AREDS 2 PO)    Take 1 capsule by mouth 2 (two) times daily.    POLYETHYL GLYCOL-PROPYL GLYCOL 0.4-0.3 % SOLN    Place 1 vial into both eyes 2 (two) times daily. May use another dose during the day as needed for dry eye   VITAMIN C (ASCORBIC ACID) 500 MG TABLET    Take 500 mg by mouth daily with supper.   Modified Medications   No medications on file  Discontinued Medications   CRANBERRY EXTRACT PO    Take 2 tablets by mouth daily as needed.   PSYLLIUM (METAMUCIL) 58.6 % PACKET    Take 1 packet by mouth daily. Mix with water    Physical Exam:  There were no vitals filed for this visit. There is no height or weight on file to calculate BMI. Wt Readings from Last 3 Encounters:  07/26/22 140 lb (63.5 kg)  03/08/22 138 lb 3.2 oz (62.7 kg)  09/07/21 136 lb 9.6 oz (62 kg)    Physical Exam Vitals and nursing note reviewed.  Constitutional:      Appearance: Normal appearance.  Cardiovascular:     Rate and Rhythm: Normal rate and regular rhythm.  Pulmonary:     Effort: Pulmonary effort is normal.     Breath sounds: Normal breath sounds.  Musculoskeletal:        General: Normal range of motion.   Neurological:     General: No focal deficit present.     Mental Status: She is alert and oriented to person, place, and time.     Labs reviewed: Basic Metabolic Panel: Recent Labs    03/20/22 0822 07/26/22 1715  NA 141 139  K 4.1 4.2  CL 105 102  CO2 29 27  GLUCOSE 87 100*  BUN 25 18  CREATININE 0.63 0.74  CALCIUM 9.5 9.8   Liver Function Tests: Recent Labs    03/20/22 0822  AST 17  ALT 16  BILITOT 0.6  PROT 6.3   No results for input(s): "LIPASE", "AMYLASE" in the last 8760 hours. No results for input(s): "AMMONIA" in the last 8760 hours. CBC: Recent Labs    07/26/22 1715  WBC 7.0  NEUTROABS 4.1  HGB 13.6  HCT 41.0  MCV 92.6  PLT 207   Lipid Panel: No results for input(s): "CHOL", "HDL", "LDLCALC", "TRIG", "CHOLHDL", "LDLDIRECT" in the last 8760 hours. TSH: No results for input(s): "TSH" in the last 8760 hours. A1C: Lab Results  Component Value Date   HGBA1C 5.3 05/02/2013     Assessment/Plan  1. Essential hypertension Blood pressure is good at 132/80 on metoprolol 25 mg  2. Mixed hyperlipidemia Patient takes no medicine for lipids and last profile was done 3 years ago and LDL was 94  3. Macular degeneration of both eyes, unspecified type Patient does take a reds to twice daily  Jacalyn Lefevre, MD [facility] [phone]

## 2022-08-24 DIAGNOSIS — M6281 Muscle weakness (generalized): Secondary | ICD-10-CM | POA: Diagnosis not present

## 2022-08-24 DIAGNOSIS — R278 Other lack of coordination: Secondary | ICD-10-CM | POA: Diagnosis not present

## 2022-08-24 DIAGNOSIS — R2681 Unsteadiness on feet: Secondary | ICD-10-CM | POA: Diagnosis not present

## 2022-08-24 DIAGNOSIS — H3530 Unspecified macular degeneration: Secondary | ICD-10-CM | POA: Diagnosis not present

## 2022-08-24 DIAGNOSIS — G459 Transient cerebral ischemic attack, unspecified: Secondary | ICD-10-CM | POA: Diagnosis not present

## 2022-08-24 DIAGNOSIS — Z9181 History of falling: Secondary | ICD-10-CM | POA: Diagnosis not present

## 2022-08-25 ENCOUNTER — Telehealth: Payer: Self-pay | Admitting: *Deleted

## 2022-08-25 DIAGNOSIS — H3530 Unspecified macular degeneration: Secondary | ICD-10-CM | POA: Diagnosis not present

## 2022-08-25 DIAGNOSIS — M6281 Muscle weakness (generalized): Secondary | ICD-10-CM | POA: Diagnosis not present

## 2022-08-25 DIAGNOSIS — G459 Transient cerebral ischemic attack, unspecified: Secondary | ICD-10-CM | POA: Diagnosis not present

## 2022-08-25 DIAGNOSIS — R278 Other lack of coordination: Secondary | ICD-10-CM | POA: Diagnosis not present

## 2022-08-25 DIAGNOSIS — R2681 Unsteadiness on feet: Secondary | ICD-10-CM | POA: Diagnosis not present

## 2022-08-25 DIAGNOSIS — Z9181 History of falling: Secondary | ICD-10-CM | POA: Diagnosis not present

## 2022-08-25 NOTE — Telephone Encounter (Signed)
Patient daughter, Orlie Pollen, called wanting the status of FL2 form.   I called Dr. Hyacinth Meeker and he stated that he has completed the form and will drop it off at [facility] this weekend.   Daughter notified and agreed.

## 2022-08-28 DIAGNOSIS — H3530 Unspecified macular degeneration: Secondary | ICD-10-CM | POA: Diagnosis not present

## 2022-08-28 DIAGNOSIS — G459 Transient cerebral ischemic attack, unspecified: Secondary | ICD-10-CM | POA: Diagnosis not present

## 2022-08-28 DIAGNOSIS — R2681 Unsteadiness on feet: Secondary | ICD-10-CM | POA: Diagnosis not present

## 2022-08-28 DIAGNOSIS — Z9181 History of falling: Secondary | ICD-10-CM | POA: Diagnosis not present

## 2022-08-28 DIAGNOSIS — M6281 Muscle weakness (generalized): Secondary | ICD-10-CM | POA: Diagnosis not present

## 2022-08-28 DIAGNOSIS — R278 Other lack of coordination: Secondary | ICD-10-CM | POA: Diagnosis not present

## 2022-08-29 DIAGNOSIS — Z9181 History of falling: Secondary | ICD-10-CM | POA: Diagnosis not present

## 2022-08-29 DIAGNOSIS — M6281 Muscle weakness (generalized): Secondary | ICD-10-CM | POA: Diagnosis not present

## 2022-08-29 DIAGNOSIS — L814 Other melanin hyperpigmentation: Secondary | ICD-10-CM | POA: Diagnosis not present

## 2022-08-29 DIAGNOSIS — H3530 Unspecified macular degeneration: Secondary | ICD-10-CM | POA: Diagnosis not present

## 2022-08-29 DIAGNOSIS — D1801 Hemangioma of skin and subcutaneous tissue: Secondary | ICD-10-CM | POA: Diagnosis not present

## 2022-08-29 DIAGNOSIS — R2681 Unsteadiness on feet: Secondary | ICD-10-CM | POA: Diagnosis not present

## 2022-08-29 DIAGNOSIS — L853 Xerosis cutis: Secondary | ICD-10-CM | POA: Diagnosis not present

## 2022-08-29 DIAGNOSIS — L820 Inflamed seborrheic keratosis: Secondary | ICD-10-CM | POA: Diagnosis not present

## 2022-08-29 DIAGNOSIS — L821 Other seborrheic keratosis: Secondary | ICD-10-CM | POA: Diagnosis not present

## 2022-08-29 DIAGNOSIS — R278 Other lack of coordination: Secondary | ICD-10-CM | POA: Diagnosis not present

## 2022-08-29 DIAGNOSIS — L570 Actinic keratosis: Secondary | ICD-10-CM | POA: Diagnosis not present

## 2022-08-29 DIAGNOSIS — G459 Transient cerebral ischemic attack, unspecified: Secondary | ICD-10-CM | POA: Diagnosis not present

## 2022-08-31 DIAGNOSIS — G459 Transient cerebral ischemic attack, unspecified: Secondary | ICD-10-CM | POA: Diagnosis not present

## 2022-08-31 DIAGNOSIS — Z9181 History of falling: Secondary | ICD-10-CM | POA: Diagnosis not present

## 2022-08-31 DIAGNOSIS — R2681 Unsteadiness on feet: Secondary | ICD-10-CM | POA: Diagnosis not present

## 2022-08-31 DIAGNOSIS — M6281 Muscle weakness (generalized): Secondary | ICD-10-CM | POA: Diagnosis not present

## 2022-08-31 DIAGNOSIS — R278 Other lack of coordination: Secondary | ICD-10-CM | POA: Diagnosis not present

## 2022-08-31 DIAGNOSIS — H3530 Unspecified macular degeneration: Secondary | ICD-10-CM | POA: Diagnosis not present

## 2022-09-04 DIAGNOSIS — R278 Other lack of coordination: Secondary | ICD-10-CM | POA: Diagnosis not present

## 2022-09-04 DIAGNOSIS — M6281 Muscle weakness (generalized): Secondary | ICD-10-CM | POA: Diagnosis not present

## 2022-09-04 DIAGNOSIS — G459 Transient cerebral ischemic attack, unspecified: Secondary | ICD-10-CM | POA: Diagnosis not present

## 2022-09-04 DIAGNOSIS — R2681 Unsteadiness on feet: Secondary | ICD-10-CM | POA: Diagnosis not present

## 2022-09-04 DIAGNOSIS — Z9181 History of falling: Secondary | ICD-10-CM | POA: Diagnosis not present

## 2022-09-04 DIAGNOSIS — H3530 Unspecified macular degeneration: Secondary | ICD-10-CM | POA: Diagnosis not present

## 2022-09-05 DIAGNOSIS — Z9181 History of falling: Secondary | ICD-10-CM | POA: Diagnosis not present

## 2022-09-05 DIAGNOSIS — M6281 Muscle weakness (generalized): Secondary | ICD-10-CM | POA: Diagnosis not present

## 2022-09-05 DIAGNOSIS — R2681 Unsteadiness on feet: Secondary | ICD-10-CM | POA: Diagnosis not present

## 2022-09-05 DIAGNOSIS — H3530 Unspecified macular degeneration: Secondary | ICD-10-CM | POA: Diagnosis not present

## 2022-09-05 DIAGNOSIS — G459 Transient cerebral ischemic attack, unspecified: Secondary | ICD-10-CM | POA: Diagnosis not present

## 2022-09-05 DIAGNOSIS — R278 Other lack of coordination: Secondary | ICD-10-CM | POA: Diagnosis not present

## 2022-09-06 ENCOUNTER — Encounter: Payer: Self-pay | Admitting: Orthopedic Surgery

## 2022-09-06 ENCOUNTER — Non-Acute Institutional Stay (INDEPENDENT_AMBULATORY_CARE_PROVIDER_SITE_OTHER): Payer: Medicare Other | Admitting: Orthopedic Surgery

## 2022-09-06 DIAGNOSIS — R278 Other lack of coordination: Secondary | ICD-10-CM | POA: Diagnosis not present

## 2022-09-06 DIAGNOSIS — Z9181 History of falling: Secondary | ICD-10-CM | POA: Diagnosis not present

## 2022-09-06 DIAGNOSIS — G459 Transient cerebral ischemic attack, unspecified: Secondary | ICD-10-CM | POA: Diagnosis not present

## 2022-09-06 DIAGNOSIS — Z0000 Encounter for general adult medical examination without abnormal findings: Secondary | ICD-10-CM

## 2022-09-06 DIAGNOSIS — H3530 Unspecified macular degeneration: Secondary | ICD-10-CM | POA: Diagnosis not present

## 2022-09-06 DIAGNOSIS — R2681 Unsteadiness on feet: Secondary | ICD-10-CM | POA: Diagnosis not present

## 2022-09-06 DIAGNOSIS — M6281 Muscle weakness (generalized): Secondary | ICD-10-CM | POA: Diagnosis not present

## 2022-09-06 NOTE — Progress Notes (Signed)
Subjective:   [name] is a 89 y.o. female who presents for Medicare Annual (Subsequent) preventive examination.  [name]: [facility] assisted living unit Provider: Hazle Nordmann, AGNP-C   Review of Systems     Cardiac Risk Factors include: advanced age (>29men, >24 women);hypertension;sedentary lifestyle     Objective:    Today's Vitals   09/06/22 1031  BP: (!) 165/93  Pulse: 70  Resp: 18  Temp: 97.6 F (36.4 C)  SpO2: 98%  Weight: 147 lb (66.7 kg)  Height:  (1.626 m)   Body mass index is 25.23 kg/m.     09/06/2022   10:33 AM 07/31/2022   11:27 AM 07/26/2022    4:56 PM 08/01/2021   12:19 PM 07/08/2021    3:22 PM 07/06/2021    2:20 PM 11/16/2020    1:27 PM  Advanced Directives  Does Patient Have a Medical Advance Directive? Yes Yes No Yes Yes Yes Yes  Type of Estate agent of [place];Living will;Out of facility DNR (pink MOST or yellow form) Healthcare Power of [place];Living will;Out of facility DNR (pink MOST or yellow form)  Healthcare Power of [place];Living will;Out of facility DNR (pink MOST or yellow form) Healthcare Power of [place];Living will;Out of facility DNR (pink MOST or yellow form) Healthcare Power of [place];Living will;Out of facility DNR (pink MOST or yellow form) Healthcare Power of [place];Living will;Out of facility DNR (pink MOST or yellow form)  Does patient want to make changes to medical advance directive? No - Patient declined No - Patient declined  No - Patient declined No - Patient declined No - Patient declined No - Patient declined  Copy of Healthcare Power of Attorney in Chart? Yes - validated most recent copy scanned in chart (See row information) Yes - validated most recent copy scanned in chart (See row information)  Yes - validated most recent copy scanned in chart (See row information) Yes - validated most recent copy scanned in chart (See row information) Yes - validated most recent copy  scanned in chart (See row information) Yes - validated most recent copy scanned in chart (See row information)  Would patient like information on creating a medical advance directive?   No - Patient declined      Pre-existing out of facility DNR order (yellow form or pink MOST form)       Yellow form placed in chart (order not valid for inpatient use)    Current Medications (verified) Outpatient Encounter Medications as of 09/06/2022  Medication Sig   acetaminophen (TYLENOL) 500 MG tablet Take 500 mg by mouth 2 (two) times daily as needed for mild pain or headache.    cephALEXin (KEFLEX) 250 MG capsule Take 250 mg by mouth daily.   Cholecalciferol (VITAMIN D3) 50 MCG (2000 UT) TABS Take by mouth.   clopidogrel (PLAVIX) 75 MG tablet TAKE ONE TABLET BY MOUTH DAILY   Cranberry-Vitamin C-Probiotic (AZO CRANBERRY PO) Take 1 tablet by mouth daily.   metoprolol succinate (TOPROL-XL) 25 MG 24 hr tablet Take one tablet by mouth once daily to regulate heart and control blood pressure.   Multiple Vitamins-Minerals (PRESERVISION AREDS 2 PO) Take 1 capsule by mouth 2 (two) times daily.    tuberculin (TUBERSOL) 5 UNIT/0.1ML injection Inject 0.1 mLs into the skin daily at 12 noon.   vitamin C (ASCORBIC ACID) 500 MG tablet Take 500 mg by mouth daily with supper.    famotidine (PEPCID AC) 10 MG chewable tablet Chew  10 mg by mouth at bedtime as needed for heartburn.   [DISCONTINUED] Calcium Carbonate-Vitamin D 600-400 MG-UNIT chew tablet Chew 1 tablet by mouth 2 (two) times daily.   No facility-administered encounter medications on file as of 09/06/2022.    Allergies (verified) Trimethoprim, Erythromycin, Macrodantin [nitrofurantoin macrocrystal], Penicillins, Sulfa antibiotics, and Latex   History: Past Medical History:  Diagnosis Date   Cervicalgia    Chest pain at rest 01/07/2012   Displacement of cervical intervertebral disc without myelopathy    Diverticulosis of colon (without mention of hemorrhage)     Female stress incontinence    GERD (gastroesophageal reflux disease)    no peds occasional pepcid   Inguinal hernia without mention of obstruction or gangrene, unilateral or unspecified, (not specified as recurrent)    Internal hemorrhoids without mention of complication    Lumbago    Macular degeneration (senile) of retina, unspecified    Osteoarthrosis, unspecified whether generalized or localized, unspecified site    Other and unspecified hyperlipidemia    Pain in joint, hand    Palpitations    Reflux esophagitis    Scoliosis (and kyphoscoliosis), idiopathic    Senile osteoporosis    Skin disorder    Thyroid disease    TIA (transient ischemic attack)    Unruptured popliteal cyst 06/24/2014   Right knee    Unspecified essential hypertension    Unspecified glaucoma(365.9)    Unspecified hypothyroidism    Unspecified vitamin D deficiency    Past Surgical History:  Procedure Laterality Date   ABDOMINAL HYSTERECTOMY  1975   Dr Dewaine Conger   APPENDECTOMY  1988   cardiolite myocardial perfusion study     DOPPLER ECHOCARDIOGRAPHY     EYE SURGERY Bilateral 2009   cataract removed right eye, Dr Emmit Pomfret   FOOT SURGERY  august 2013   Hewitt, MD   INGUINAL HERNIA REPAIR Bilateral    w/mesh   INGUINAL HERNIA REPAIR Left 08/03/2017   Procedure: LAPAROSCOPIC LEFT INGUINAL HERNIA REPAIR WITH MESH;  Surgeon: Gaynelle Adu, MD;  Location[facility] OR;  Service: General;  Laterality: Left;   INGUINAL HERNIA REPAIR Right 08/03/2017   Procedure: HERNIA REPAIR RIGHT INGUINAL ADULT WITH MESH;  Surgeon: Gaynelle Adu, MD;  Location: [facility] OR;  Service: General;  Laterality: Right;   INGUINAL HERNIA REPAIR     Dr. Andrey Campanile 04-24-18   INGUINAL HERNIA REPAIR Right 04/24/2018   Procedure: DIAGNOSTIC LAPAROSCOPIC, OPENREPAIR OF RECURRENT RIGHT INGUINAL HERNIA WITH MESH ERAS PATHWAY;  Surgeon: Gaynelle Adu, MD;  Location: [facility] ORS;  Service: General;  Laterality: Right;   INSERTION OF MESH Bilateral 08/03/2017    Procedure: INSERTION OF MESH;  Surgeon: Gaynelle Adu, MD;  Location: [facility] OR;  Service: General;  Laterality: Bilateral;   NM MYOVIEW LTD     negative   TONSILLECTOMY  1937   TOOTH EXTRACTION  09/16/13   Dr Randel Books   Family History  Problem Relation Age of Onset   CAD Mother    Parkinson's disease Mother    Cancer Father        colon cancer   Parkinson's disease Father    Stroke Maternal Grandmother    Social History   Socioeconomic History   Marital status: Widowed    Spouse name: Not on file   Number of children: 2   Years of education: college   Highest education level: Not on file  Occupational History    Comment: retired  Tobacco Use   Smoking status: Never   Smokeless tobacco: Never  Vaping Use   Vaping Use: Never used  Substance and Sexual Activity   Alcohol use: No   Drug use: No   Sexual activity: Not Currently  Other Topics Concern   Not on file  Social History Narrative   Patient lives at home with her daughter Larita Fife) , moved to [facility] 02/02/16 [place]    Widowed.   Retired.   EducationPsychiatrist.   Right handed.   Caffeine- None some times tea very rare.   Never smoked   Alcohol none   Social Determinants of Health   Financial Resource Strain: Low Risk  (09/06/2022)   Overall Financial Resource Strain (CARDIA)    Difficulty of Paying Living Expenses: Not hard at all  Food Insecurity: No Food Insecurity (09/06/2022)   Hunger Vital Sign    Worried About Running Out of Food in the Last Year: Never true    Ran Out of Food in the Last Year: Never true  Transportation Needs: No Transportation Needs (09/06/2022)   PRAPARE - Administrator, Civil Service (Medical): No    Lack of Transportation (Non-Medical): No  Physical Activity: Insufficiently Active (09/06/2022)   Exercise Vital Sign    Days of Exercise per Week: 3 days    Minutes of Exercise per Session: 10 min  Stress: No Stress Concern Present (09/06/2022)   [employer] of  Occupational Health - Occupational Stress Questionnaire    Feeling of Stress : Only a little  Social Connections: Moderately Integrated (09/06/2022)   Social Connection and Isolation Panel [NHANES]    Frequency of Communication with Friends and Family: More than three times a week    Frequency of Social Gatherings with Friends and Family: More than three times a week    Attends Religious Services: More than 4 times per year    Active Member of [employer] or Organizations: Yes    Attends Banker Meetings: More than 4 times per year    Marital Status: Widowed    Tobacco Counseling Counseling given: Not Answered   Clinical Intake:  Pre-visit preparation completed: No  Pain : No/denies pain     BMI - recorded: 25.53 Nutritional Status: BMI 25 -29 Overweight Nutritional Risks: None Diabetes: No  What is the last grade level you completed in school?: [place]  Diabetic?No         Activities of Daily Living    09/06/2022    2:28 PM  In your present state of health, do you have any difficulty performing the following activities:  Hearing? 0  Vision? 0  Difficulty concentrating or making decisions? 0  Walking or climbing stairs? 1  Dressing or bathing? 1  Doing errands, shopping? 1  Preparing Food and eating ? Y  Using the Toilet? N  In the past six months, have you accidently leaked urine? Y  Do you have problems with loss of bowel control? Y  Managing your Medications? Y  Managing your Finances? Y  Housekeeping or managing your Housekeeping? Y    Patient Care Team: Mahlon Gammon, MD as PCP - General (Internal Medicine) Runell Gess, MD as PCP - Cardiology (Cardiology) Carman Ching, MD (Inactive) as Consulting Physician (Gastroenterology) Esaw Dace, MD as Attending Physician (Urology) Runell Gess, MD as Consulting Physician (Cardiology) Drema Halon, MD (Inactive) as Consulting Physician (Otolaryngology) Elizebeth Koller, DDS (Dentistry) Mckinley Jewel, MD as Consulting Physician (Ophthalmology) Bradly Bienenstock, MD as Consulting Physician (Orthopedic Surgery) Gaynelle Adu, MD as  Consulting Physician (General Surgery)  Indicate any recent Medical Services you may have received from other than [facility] providers in the past year (date may be approximate).     Assessment:   This is a routine wellness examination for [facility].  Hearing/Vision screen No results found.  Dietary issues and exercise activities discussed: Current Exercise Habits: The patient does not participate in regular exercise at present, Exercise limited by: neurologic condition(s);cardiac condition(s);orthopedic condition(s)   Goals Addressed             This Visit's Progress    Increase physical activity   Not on track    Starting 06/27/16, I will attempt to increase my physical activity.      Maintain Mobility and Function   Not on track    Evidence-based guidance:  Acknowledge and validate impact of pain, loss of strength and potential disfigurement (hand osteoarthritis) on mental health and daily life, such as social isolation, anxiety, depression, impaired sexual relationship and   injury from falls.  Anticipate referral to physical or occupational therapy for assessment, therapeutic exercise and recommendation for adaptive equipment or assistive devices; encourage participation.  Assess impact on ability to perform activities of daily living, as well as engage in sports and leisure events or requirements of work or school.  Provide anticipatory guidance and reassurance about the benefit of exercise to maintain function; acknowledge and normalize fear that exercise may worsen symptoms.  Encourage regular exercise, at least 10 minutes at a time for 45 minutes per week; consider yoga, water exercise and proprioceptive exercises; encourage use of wearable activity tracker to increase motivation and adherence.  Encourage maintenance  or resumption of daily activities, including employment, as pain allows and with minimal exposure to trauma.  Assist patient to advocate for adaptations to the work environment.  Consider level of pain and function, gender, age, lifestyle, patient preference, quality of life, readiness and ?ocapacity to benefit? when recommending patients for orthopaedic surgery consultation.  Explore strategies, such as changes to medication regimen or activity that enables patient to anticipate and manage flare-ups that increase deconditioning and disability.  Explore patient preferences; encourage exposure to a broader range of activities that have been avoided for fear of experiencing pain.  Identify barriers to participation in therapy or exercise, such as pain with activity, anticipated or imagined pain.  Monitor postoperative joint replacement or any preexisting joint replacement for ongoing pain and loss of function; provide social support and encouragement throughout recovery.   Notes:        Depression Screen    11/16/2020    1:22 PM 06/14/2020    2:31 PM 11/13/2019   11:17 AM 10/27/2019   10:08 AM 03/10/2019   11:46 AM 11/04/2018   11:38 AM 10/18/2018    1:39 PM  PHQ 2/9 Scores  PHQ - 2 Score 0 0 0 0 0 0 0    Fall Risk    09/06/2022    2:27 PM 08/23/2022    1:05 PM 07/31/2022   11:27 AM 03/08/2022    2:52 PM 07/08/2021    3:22 PM  Fall Risk   Falls in the past year? 1 1 1  0 0  Number falls in past yr: 0 0 0 0 0  Injury with Fall? 1 0 1 0 0  Risk for fall due to : History of fall(s);Impaired balance/gait;Impaired mobility History of fall(s) Impaired balance/gait;Impaired mobility No Fall Risks No Fall Risks  Follow up Falls evaluation completed;Education provided;Falls prevention discussed Falls evaluation completed  Falls evaluation completed;Education provided;Falls prevention discussed  Falls evaluation completed    FALL RISK PREVENTION PERTAINING TO THE HOME:  Any stairs in or around the  home? No  If so, are there any without handrails? No  Home free of loose throw rugs in walkways, pet beds, electrical cords, etc? Yes  Adequate lighting in your home to reduce risk of falls? Yes   ASSISTIVE DEVICES UTILIZED TO PREVENT FALLS:  Life alert? No  Use of a cane, walker or w/c? Yes  Grab bars in the bathroom? Yes  Shower chair or bench in shower? Yes  Elevated toilet seat or a handicapped toilet? Yes   TIMED UP AND GO:  Was the test performed? No .  Length of time to ambulate 10 feet: N/A sec.   Gait slow and steady with assistive device  Cognitive Function:    09/06/2022    2:04 PM 10/15/2017    1:58 PM 06/27/2016    2:36 PM 06/22/2015    3:24 PM 06/24/2014    1:07 PM  MMSE - Mini Mental State Exam  Orientation to time 5 5 5 5 5   Orientation to Place 5 5 5 5 5   Registration 2 3 3 3 3   Attention/ Calculation 5 5 5 5 5   Recall 2 3 1 1 1   Language- name 2 objects 2 2 2 2 2   Language- repeat 1 1 1 1 1   Language- follow 3 step command 3 3 3 3 3   Language- read & follow direction 1 1 1 1 1   Write a sentence 1 1 1 1 1   Copy design 1 1 1 1 1   Total score 28 30 28 28 28         11/16/2020    1:28 PM 10/27/2019   10:09 AM 10/18/2018    1:42 PM  6CIT Screen  What Year? 0 points 0 points 0 points  What month? 0 points 0 points 0 points  What time? 0 points 0 points 0 points  Count back from 20 0 points 0 points 0 points  Months in reverse 0 points 0 points 0 points  Repeat phrase 6 points 2 points 0 points  Total Score 6 points 2 points 0 points    Immunizations Immunization History  Administered Date(s) Administered   Fluad Quad(high Dose 65+) 02/04/2019, 03/01/2022   Influenza Whole 02/24/2011, 02/12/2012   Influenza, High Dose Seasonal PF 02/01/2017, 02/25/2018, 02/11/2020   Influenza,inj,Quad PF,6+ Mos 02/20/2013, 02/10/2014, 02/19/2015, 02/18/2016   Influenza-Unspecified 02/16/2021   Moderna Sars-Covid-2 Vaccination 05/19/2019, 06/16/2019, 03/29/2020,  09/27/2020   Pneumococcal Conjugate-13 06/24/2014   Pneumococcal Polysaccharide-23 04/12/1998   Td 02/21/1996, 07/23/2003   Tdap 03/20/2010   Zoster Recombinat (Shingrix) 09/19/2016, 12/02/2016   Zoster, Live 09/15/2005    TDAP status: Due, Education has been provided regarding the importance of this vaccine. Advised may receive this vaccine at local pharmacy or Health Dept. Aware to provide a copy of the vaccination record if obtained from local pharmacy or Health Dept. Verbalized acceptance and understanding.  Flu Vaccine status: Up to date  Pneumococcal vaccine status: Up to date  Covid-19 vaccine status: Completed vaccines  Qualifies for Shingles Vaccine? Yes   Zostavax completed Yes   Shingrix Completed?: Yes  Screening Tests Health Maintenance  Topic Date Due   DTaP/Tdap/Td (4 - Td or Tdap) 03/20/2020   COVID-19 Vaccine (5 - 2023-24 season) 01/13/2022   INFLUENZA VACCINE  12/14/2022   Medicare Annual Wellness (AWV)  09/06/2023   Pneumonia  Vaccine 60+ Years old  Completed   DEXA SCAN  Completed   Zoster Vaccines- Shingrix  Completed   HPV VACCINES  Aged Out    Health Maintenance  Health Maintenance Due  Topic Date Due   DTaP/Tdap/Td (4 - Td or Tdap) 03/20/2020   COVID-19 Vaccine (5 - 2023-24 season) 01/13/2022    Colorectal cancer screening: No longer required.   Mammogram status: No longer required due to advanced age.  Bone Density status: Completed 2020. Results reflect: Bone density results: OSTEOPOROSIS. Repeat every 2> refused repeat study today years.  Lung Cancer Screening: (Low Dose CT Chest recommended if Age 74-80 years, 30 pack-year currently smoking OR have quit w/in 15years.) does not qualify.   Lung Cancer Screening Referral: No  Additional Screening:  Hepatitis C Screening: does not qualify; Completed   Vision Screening: Recommended annual ophthalmology exams for early detection of glaucoma and other disorders of the eye. Is the patient up  to date with their annual eye exam?  Yes  Who is the provider or what is the name of the office in which the patient attends annual eye exams? Dr. Allena Katz If pt is not established with a provider, would they like to be referred to a provider to establish care? No .   Dental Screening: Recommended annual dental exams for proper oral hygiene  Community Resource Referral / Chronic Care Management: CRR required this visit?  No   CCM required this visit?  No      Plan:     I have personally reviewed and noted the following in the patient's chart:   Medical and social history Use of alcohol, tobacco or illicit drugs  Current medications and supplements including opioid prescriptions. Patient is not currently taking opioid prescriptions. Functional ability and status Nutritional status Physical activity Advanced directives List of other physicians Hospitalizations, surgeries, and ER visits in previous 12 months Vitals Screenings to include cognitive, depression, and falls Referrals and appointments  In addition, I have reviewed and discussed with patient certain preventive protocols, quality metrics, and best practice recommendations. A written personalized care plan for preventive services as well as general preventive health recommendations were provided to patient.     Octavia Heir, NP   09/06/2022   Nurse Notes: refused tdap at this time.

## 2022-09-06 NOTE — Patient Instructions (Signed)
  [name] , Thank you for taking time to come for your Medicare Wellness Visit. I appreciate your ongoing commitment to your health goals. Please review the following plan we discussed and let me know if I can assist you in the future.   These are the goals we discussed:  Goals      Increase physical activity     Starting 06/27/16, I will attempt to increase my physical activity.      Maintain Mobility and Function     Evidence-based guidance:  Acknowledge and validate impact of pain, loss of strength and potential disfigurement (hand osteoarthritis) on mental health and daily life, such as social isolation, anxiety, depression, impaired sexual relationship and   injury from falls.  Anticipate referral to physical or occupational therapy for assessment, therapeutic exercise and recommendation for adaptive equipment or assistive devices; encourage participation.  Assess impact on ability to perform activities of daily living, as well as engage in sports and leisure events or requirements of work or school.  Provide anticipatory guidance and reassurance about the benefit of exercise to maintain function; acknowledge and normalize fear that exercise may worsen symptoms.  Encourage regular exercise, at least 10 minutes at a time for 45 minutes per week; consider yoga, water exercise and proprioceptive exercises; encourage use of wearable activity tracker to increase motivation and adherence.  Encourage maintenance or resumption of daily activities, including employment, as pain allows and with minimal exposure to trauma.  Assist patient to advocate for adaptations to the work environment.  Consider level of pain and function, gender, age, lifestyle, patient preference, quality of life, readiness and ?ocapacity to benefit? when recommending patients for orthopaedic surgery consultation.  Explore strategies, such as changes to medication regimen or activity that enables patient to anticipate and  manage flare-ups that increase deconditioning and disability.  Explore patient preferences; encourage exposure to a broader range of activities that have been avoided for fear of experiencing pain.  Identify barriers to participation in therapy or exercise, such as pain with activity, anticipated or imagined pain.  Monitor postoperative joint replacement or any preexisting joint replacement for ongoing pain and loss of function; provide social support and encouragement throughout recovery.   Notes:         This is a list of the screening recommended for you and due dates:  Health Maintenance  Topic Date Due   DTaP/Tdap/Td vaccine (4 - Td or Tdap) 03/20/2020   COVID-19 Vaccine (5 - 2023-24 season) 01/13/2022   Flu Shot  12/14/2022   Medicare Annual Wellness Visit  09/06/2023   Pneumonia Vaccine  Completed   DEXA scan (bone density measurement)  Completed   Zoster (Shingles) Vaccine  Completed   HPV Vaccine  Aged Out

## 2022-09-07 DIAGNOSIS — R278 Other lack of coordination: Secondary | ICD-10-CM | POA: Diagnosis not present

## 2022-09-07 DIAGNOSIS — G459 Transient cerebral ischemic attack, unspecified: Secondary | ICD-10-CM | POA: Diagnosis not present

## 2022-09-07 DIAGNOSIS — H3530 Unspecified macular degeneration: Secondary | ICD-10-CM | POA: Diagnosis not present

## 2022-09-07 DIAGNOSIS — M6281 Muscle weakness (generalized): Secondary | ICD-10-CM | POA: Diagnosis not present

## 2022-09-07 DIAGNOSIS — Z9181 History of falling: Secondary | ICD-10-CM | POA: Diagnosis not present

## 2022-09-07 DIAGNOSIS — R2681 Unsteadiness on feet: Secondary | ICD-10-CM | POA: Diagnosis not present

## 2022-09-08 DIAGNOSIS — H3530 Unspecified macular degeneration: Secondary | ICD-10-CM | POA: Diagnosis not present

## 2022-09-08 DIAGNOSIS — R278 Other lack of coordination: Secondary | ICD-10-CM | POA: Diagnosis not present

## 2022-09-08 DIAGNOSIS — G459 Transient cerebral ischemic attack, unspecified: Secondary | ICD-10-CM | POA: Diagnosis not present

## 2022-09-08 DIAGNOSIS — M6281 Muscle weakness (generalized): Secondary | ICD-10-CM | POA: Diagnosis not present

## 2022-09-08 DIAGNOSIS — Z9181 History of falling: Secondary | ICD-10-CM | POA: Diagnosis not present

## 2022-09-08 DIAGNOSIS — R2681 Unsteadiness on feet: Secondary | ICD-10-CM | POA: Diagnosis not present

## 2022-09-11 DIAGNOSIS — M6281 Muscle weakness (generalized): Secondary | ICD-10-CM | POA: Diagnosis not present

## 2022-09-11 DIAGNOSIS — Z9181 History of falling: Secondary | ICD-10-CM | POA: Diagnosis not present

## 2022-09-11 DIAGNOSIS — G459 Transient cerebral ischemic attack, unspecified: Secondary | ICD-10-CM | POA: Diagnosis not present

## 2022-09-11 DIAGNOSIS — H3530 Unspecified macular degeneration: Secondary | ICD-10-CM | POA: Diagnosis not present

## 2022-09-11 DIAGNOSIS — R278 Other lack of coordination: Secondary | ICD-10-CM | POA: Diagnosis not present

## 2022-09-11 DIAGNOSIS — R2681 Unsteadiness on feet: Secondary | ICD-10-CM | POA: Diagnosis not present

## 2022-09-12 DIAGNOSIS — Z9181 History of falling: Secondary | ICD-10-CM | POA: Diagnosis not present

## 2022-09-12 DIAGNOSIS — M6281 Muscle weakness (generalized): Secondary | ICD-10-CM | POA: Diagnosis not present

## 2022-09-12 DIAGNOSIS — H3530 Unspecified macular degeneration: Secondary | ICD-10-CM | POA: Diagnosis not present

## 2022-09-12 DIAGNOSIS — R278 Other lack of coordination: Secondary | ICD-10-CM | POA: Diagnosis not present

## 2022-09-12 DIAGNOSIS — R2681 Unsteadiness on feet: Secondary | ICD-10-CM | POA: Diagnosis not present

## 2022-09-12 DIAGNOSIS — G459 Transient cerebral ischemic attack, unspecified: Secondary | ICD-10-CM | POA: Diagnosis not present

## 2022-09-14 DIAGNOSIS — M6281 Muscle weakness (generalized): Secondary | ICD-10-CM | POA: Diagnosis not present

## 2022-09-14 DIAGNOSIS — R2681 Unsteadiness on feet: Secondary | ICD-10-CM | POA: Diagnosis not present

## 2022-09-14 DIAGNOSIS — Z9181 History of falling: Secondary | ICD-10-CM | POA: Diagnosis not present

## 2022-09-14 DIAGNOSIS — G459 Transient cerebral ischemic attack, unspecified: Secondary | ICD-10-CM | POA: Diagnosis not present

## 2022-09-14 DIAGNOSIS — R41841 Cognitive communication deficit: Secondary | ICD-10-CM | POA: Diagnosis not present

## 2022-09-14 DIAGNOSIS — R278 Other lack of coordination: Secondary | ICD-10-CM | POA: Diagnosis not present

## 2022-09-14 DIAGNOSIS — H3530 Unspecified macular degeneration: Secondary | ICD-10-CM | POA: Diagnosis not present

## 2022-09-15 DIAGNOSIS — Z9181 History of falling: Secondary | ICD-10-CM | POA: Diagnosis not present

## 2022-09-15 DIAGNOSIS — G459 Transient cerebral ischemic attack, unspecified: Secondary | ICD-10-CM | POA: Diagnosis not present

## 2022-09-15 DIAGNOSIS — H3530 Unspecified macular degeneration: Secondary | ICD-10-CM | POA: Diagnosis not present

## 2022-09-15 DIAGNOSIS — M6281 Muscle weakness (generalized): Secondary | ICD-10-CM | POA: Diagnosis not present

## 2022-09-15 DIAGNOSIS — R278 Other lack of coordination: Secondary | ICD-10-CM | POA: Diagnosis not present

## 2022-09-15 DIAGNOSIS — R2681 Unsteadiness on feet: Secondary | ICD-10-CM | POA: Diagnosis not present

## 2022-09-18 DIAGNOSIS — M6281 Muscle weakness (generalized): Secondary | ICD-10-CM | POA: Diagnosis not present

## 2022-09-18 DIAGNOSIS — H3530 Unspecified macular degeneration: Secondary | ICD-10-CM | POA: Diagnosis not present

## 2022-09-18 DIAGNOSIS — G459 Transient cerebral ischemic attack, unspecified: Secondary | ICD-10-CM | POA: Diagnosis not present

## 2022-09-18 DIAGNOSIS — R278 Other lack of coordination: Secondary | ICD-10-CM | POA: Diagnosis not present

## 2022-09-18 DIAGNOSIS — R2681 Unsteadiness on feet: Secondary | ICD-10-CM | POA: Diagnosis not present

## 2022-09-18 DIAGNOSIS — Z9181 History of falling: Secondary | ICD-10-CM | POA: Diagnosis not present

## 2022-09-19 DIAGNOSIS — R278 Other lack of coordination: Secondary | ICD-10-CM | POA: Diagnosis not present

## 2022-09-19 DIAGNOSIS — R2681 Unsteadiness on feet: Secondary | ICD-10-CM | POA: Diagnosis not present

## 2022-09-19 DIAGNOSIS — Z9181 History of falling: Secondary | ICD-10-CM | POA: Diagnosis not present

## 2022-09-19 DIAGNOSIS — G459 Transient cerebral ischemic attack, unspecified: Secondary | ICD-10-CM | POA: Diagnosis not present

## 2022-09-19 DIAGNOSIS — H3530 Unspecified macular degeneration: Secondary | ICD-10-CM | POA: Diagnosis not present

## 2022-09-19 DIAGNOSIS — M6281 Muscle weakness (generalized): Secondary | ICD-10-CM | POA: Diagnosis not present

## 2022-09-20 DIAGNOSIS — R2681 Unsteadiness on feet: Secondary | ICD-10-CM | POA: Diagnosis not present

## 2022-09-20 DIAGNOSIS — G459 Transient cerebral ischemic attack, unspecified: Secondary | ICD-10-CM | POA: Diagnosis not present

## 2022-09-20 DIAGNOSIS — M6281 Muscle weakness (generalized): Secondary | ICD-10-CM | POA: Diagnosis not present

## 2022-09-20 DIAGNOSIS — R278 Other lack of coordination: Secondary | ICD-10-CM | POA: Diagnosis not present

## 2022-09-20 DIAGNOSIS — Z9181 History of falling: Secondary | ICD-10-CM | POA: Diagnosis not present

## 2022-09-20 DIAGNOSIS — H3530 Unspecified macular degeneration: Secondary | ICD-10-CM | POA: Diagnosis not present

## 2022-09-21 DIAGNOSIS — G459 Transient cerebral ischemic attack, unspecified: Secondary | ICD-10-CM | POA: Diagnosis not present

## 2022-09-21 DIAGNOSIS — H353211 Exudative age-related macular degeneration, right eye, with active choroidal neovascularization: Secondary | ICD-10-CM | POA: Diagnosis not present

## 2022-09-21 DIAGNOSIS — Z9181 History of falling: Secondary | ICD-10-CM | POA: Diagnosis not present

## 2022-09-21 DIAGNOSIS — R278 Other lack of coordination: Secondary | ICD-10-CM | POA: Diagnosis not present

## 2022-09-21 DIAGNOSIS — H3530 Unspecified macular degeneration: Secondary | ICD-10-CM | POA: Diagnosis not present

## 2022-09-21 DIAGNOSIS — R2681 Unsteadiness on feet: Secondary | ICD-10-CM | POA: Diagnosis not present

## 2022-09-21 DIAGNOSIS — M6281 Muscle weakness (generalized): Secondary | ICD-10-CM | POA: Diagnosis not present

## 2022-09-22 DIAGNOSIS — G459 Transient cerebral ischemic attack, unspecified: Secondary | ICD-10-CM | POA: Diagnosis not present

## 2022-09-22 DIAGNOSIS — R278 Other lack of coordination: Secondary | ICD-10-CM | POA: Diagnosis not present

## 2022-09-22 DIAGNOSIS — R2681 Unsteadiness on feet: Secondary | ICD-10-CM | POA: Diagnosis not present

## 2022-09-22 DIAGNOSIS — H3530 Unspecified macular degeneration: Secondary | ICD-10-CM | POA: Diagnosis not present

## 2022-09-22 DIAGNOSIS — M6281 Muscle weakness (generalized): Secondary | ICD-10-CM | POA: Diagnosis not present

## 2022-09-22 DIAGNOSIS — Z9181 History of falling: Secondary | ICD-10-CM | POA: Diagnosis not present

## 2022-09-26 DIAGNOSIS — H3530 Unspecified macular degeneration: Secondary | ICD-10-CM | POA: Diagnosis not present

## 2022-09-26 DIAGNOSIS — M6281 Muscle weakness (generalized): Secondary | ICD-10-CM | POA: Diagnosis not present

## 2022-09-26 DIAGNOSIS — Z9181 History of falling: Secondary | ICD-10-CM | POA: Diagnosis not present

## 2022-09-26 DIAGNOSIS — R278 Other lack of coordination: Secondary | ICD-10-CM | POA: Diagnosis not present

## 2022-09-26 DIAGNOSIS — G459 Transient cerebral ischemic attack, unspecified: Secondary | ICD-10-CM | POA: Diagnosis not present

## 2022-09-26 DIAGNOSIS — R2681 Unsteadiness on feet: Secondary | ICD-10-CM | POA: Diagnosis not present

## 2022-09-29 DIAGNOSIS — M6281 Muscle weakness (generalized): Secondary | ICD-10-CM | POA: Diagnosis not present

## 2022-09-29 DIAGNOSIS — H3530 Unspecified macular degeneration: Secondary | ICD-10-CM | POA: Diagnosis not present

## 2022-09-29 DIAGNOSIS — R278 Other lack of coordination: Secondary | ICD-10-CM | POA: Diagnosis not present

## 2022-09-29 DIAGNOSIS — Z9181 History of falling: Secondary | ICD-10-CM | POA: Diagnosis not present

## 2022-09-29 DIAGNOSIS — G459 Transient cerebral ischemic attack, unspecified: Secondary | ICD-10-CM | POA: Diagnosis not present

## 2022-09-29 DIAGNOSIS — R2681 Unsteadiness on feet: Secondary | ICD-10-CM | POA: Diagnosis not present

## 2022-10-02 DIAGNOSIS — Z9181 History of falling: Secondary | ICD-10-CM | POA: Diagnosis not present

## 2022-10-02 DIAGNOSIS — R278 Other lack of coordination: Secondary | ICD-10-CM | POA: Diagnosis not present

## 2022-10-02 DIAGNOSIS — R2681 Unsteadiness on feet: Secondary | ICD-10-CM | POA: Diagnosis not present

## 2022-10-02 DIAGNOSIS — M6281 Muscle weakness (generalized): Secondary | ICD-10-CM | POA: Diagnosis not present

## 2022-10-02 DIAGNOSIS — H3530 Unspecified macular degeneration: Secondary | ICD-10-CM | POA: Diagnosis not present

## 2022-10-02 DIAGNOSIS — G459 Transient cerebral ischemic attack, unspecified: Secondary | ICD-10-CM | POA: Diagnosis not present

## 2022-10-03 DIAGNOSIS — G459 Transient cerebral ischemic attack, unspecified: Secondary | ICD-10-CM | POA: Diagnosis not present

## 2022-10-03 DIAGNOSIS — R2681 Unsteadiness on feet: Secondary | ICD-10-CM | POA: Diagnosis not present

## 2022-10-03 DIAGNOSIS — Z9181 History of falling: Secondary | ICD-10-CM | POA: Diagnosis not present

## 2022-10-03 DIAGNOSIS — M6281 Muscle weakness (generalized): Secondary | ICD-10-CM | POA: Diagnosis not present

## 2022-10-03 DIAGNOSIS — H3530 Unspecified macular degeneration: Secondary | ICD-10-CM | POA: Diagnosis not present

## 2022-10-03 DIAGNOSIS — R278 Other lack of coordination: Secondary | ICD-10-CM | POA: Diagnosis not present

## 2022-10-05 DIAGNOSIS — M6281 Muscle weakness (generalized): Secondary | ICD-10-CM | POA: Diagnosis not present

## 2022-10-05 DIAGNOSIS — R2681 Unsteadiness on feet: Secondary | ICD-10-CM | POA: Diagnosis not present

## 2022-10-05 DIAGNOSIS — Z9181 History of falling: Secondary | ICD-10-CM | POA: Diagnosis not present

## 2022-10-05 DIAGNOSIS — R278 Other lack of coordination: Secondary | ICD-10-CM | POA: Diagnosis not present

## 2022-10-05 DIAGNOSIS — G459 Transient cerebral ischemic attack, unspecified: Secondary | ICD-10-CM | POA: Diagnosis not present

## 2022-10-05 DIAGNOSIS — H3530 Unspecified macular degeneration: Secondary | ICD-10-CM | POA: Diagnosis not present

## 2022-10-06 DIAGNOSIS — Z9181 History of falling: Secondary | ICD-10-CM | POA: Diagnosis not present

## 2022-10-06 DIAGNOSIS — R2681 Unsteadiness on feet: Secondary | ICD-10-CM | POA: Diagnosis not present

## 2022-10-06 DIAGNOSIS — H3530 Unspecified macular degeneration: Secondary | ICD-10-CM | POA: Diagnosis not present

## 2022-10-06 DIAGNOSIS — R278 Other lack of coordination: Secondary | ICD-10-CM | POA: Diagnosis not present

## 2022-10-06 DIAGNOSIS — M6281 Muscle weakness (generalized): Secondary | ICD-10-CM | POA: Diagnosis not present

## 2022-10-06 DIAGNOSIS — G459 Transient cerebral ischemic attack, unspecified: Secondary | ICD-10-CM | POA: Diagnosis not present

## 2022-10-16 DIAGNOSIS — R278 Other lack of coordination: Secondary | ICD-10-CM | POA: Diagnosis not present

## 2022-10-16 DIAGNOSIS — M6281 Muscle weakness (generalized): Secondary | ICD-10-CM | POA: Diagnosis not present

## 2022-10-16 DIAGNOSIS — H3530 Unspecified macular degeneration: Secondary | ICD-10-CM | POA: Diagnosis not present

## 2022-10-16 DIAGNOSIS — G459 Transient cerebral ischemic attack, unspecified: Secondary | ICD-10-CM | POA: Diagnosis not present

## 2022-10-16 DIAGNOSIS — R41841 Cognitive communication deficit: Secondary | ICD-10-CM | POA: Diagnosis not present

## 2022-10-16 DIAGNOSIS — Z9181 History of falling: Secondary | ICD-10-CM | POA: Diagnosis not present

## 2022-10-16 DIAGNOSIS — R2681 Unsteadiness on feet: Secondary | ICD-10-CM | POA: Diagnosis not present

## 2022-10-17 DIAGNOSIS — G459 Transient cerebral ischemic attack, unspecified: Secondary | ICD-10-CM | POA: Diagnosis not present

## 2022-10-17 DIAGNOSIS — M6281 Muscle weakness (generalized): Secondary | ICD-10-CM | POA: Diagnosis not present

## 2022-10-17 DIAGNOSIS — H3530 Unspecified macular degeneration: Secondary | ICD-10-CM | POA: Diagnosis not present

## 2022-10-17 DIAGNOSIS — Z9181 History of falling: Secondary | ICD-10-CM | POA: Diagnosis not present

## 2022-10-17 DIAGNOSIS — R278 Other lack of coordination: Secondary | ICD-10-CM | POA: Diagnosis not present

## 2022-10-17 DIAGNOSIS — R2681 Unsteadiness on feet: Secondary | ICD-10-CM | POA: Diagnosis not present

## 2022-10-18 DIAGNOSIS — H353221 Exudative age-related macular degeneration, left eye, with active choroidal neovascularization: Secondary | ICD-10-CM | POA: Diagnosis not present

## 2022-10-19 ENCOUNTER — Non-Acute Institutional Stay: Payer: Medicare Other | Admitting: Internal Medicine

## 2022-10-19 DIAGNOSIS — I872 Venous insufficiency (chronic) (peripheral): Secondary | ICD-10-CM

## 2022-10-19 DIAGNOSIS — Z9181 History of falling: Secondary | ICD-10-CM | POA: Diagnosis not present

## 2022-10-19 DIAGNOSIS — R2681 Unsteadiness on feet: Secondary | ICD-10-CM | POA: Diagnosis not present

## 2022-10-19 DIAGNOSIS — N390 Urinary tract infection, site not specified: Secondary | ICD-10-CM

## 2022-10-19 DIAGNOSIS — I10 Essential (primary) hypertension: Secondary | ICD-10-CM | POA: Diagnosis not present

## 2022-10-19 DIAGNOSIS — M6281 Muscle weakness (generalized): Secondary | ICD-10-CM | POA: Diagnosis not present

## 2022-10-19 DIAGNOSIS — G459 Transient cerebral ischemic attack, unspecified: Secondary | ICD-10-CM | POA: Diagnosis not present

## 2022-10-19 DIAGNOSIS — R278 Other lack of coordination: Secondary | ICD-10-CM | POA: Diagnosis not present

## 2022-10-19 DIAGNOSIS — H3530 Unspecified macular degeneration: Secondary | ICD-10-CM

## 2022-10-21 ENCOUNTER — Encounter: Payer: Self-pay | Admitting: Internal Medicine

## 2022-10-21 NOTE — Progress Notes (Signed)
Location:  [facility] Service:  [id number])  Provider:   Code Status: Full Code Goals of Care:     09/06/2022   10:33 AM  Advanced Directives  Does Patient Have a Medical Advance Directive? Yes  Type of Estate agent of [place];Living will;Out of facility DNR (pink MOST or yellow form)  Does patient want to make changes to medical advance directive? No - Patient declined  Copy of Healthcare Power of Attorney in Chart? Yes - validated most recent copy scanned in chart (See row information)     Chief Complaint  Patient presents with   Chronic Care Management    HPI: Patient is a 89 y.o. female seen today for medical management of chronic diseases.    Recent Admission to AL in [facility] Patient has a history of hypertension, hyperlipidemia, macular degeneration, mild cognitive impairment, recurrent UTI, GERD, H/o TIA  [place] was moved [place] as she needed some help with supervision for her ADLs initially she was reluctant to stay here but now she says she likes it and is doing well with the staff.  She walks with her walker. Cognitively doing well Needs supervision for her ADLs and help with her med management Did not have any other complaints today  Past Medical History:  Diagnosis Date   Cervicalgia    Chest pain at rest 01/07/2012   Displacement of cervical intervertebral disc without myelopathy    Diverticulosis of colon (without mention of hemorrhage)    Female stress incontinence    GERD (gastroesophageal reflux disease)    no peds occasional pepcid   Inguinal hernia without mention of obstruction or gangrene, unilateral or unspecified, (not specified as recurrent)    Internal hemorrhoids without mention of complication    Lumbago    Macular degeneration (senile) of retina, unspecified    Osteoarthrosis, unspecified whether generalized or localized, unspecified site    Other and unspecified hyperlipidemia    Pain in joint, hand     Palpitations    Reflux esophagitis    Scoliosis (and kyphoscoliosis), idiopathic    Senile osteoporosis    Skin disorder    Thyroid disease    TIA (transient ischemic attack)    Unruptured popliteal cyst 06/24/2014   Right knee    Unspecified essential hypertension    Unspecified glaucoma(365.9)    Unspecified hypothyroidism    Unspecified vitamin D deficiency     Past Surgical History:  Procedure Laterality Date   ABDOMINAL HYSTERECTOMY  1975   Dr Dewaine Conger   APPENDECTOMY  1988   cardiolite myocardial perfusion study     DOPPLER ECHOCARDIOGRAPHY     EYE SURGERY Bilateral 2009   cataract removed right eye, Dr Emmit Pomfret   FOOT SURGERY  august 2013   Hewitt, MD   INGUINAL HERNIA REPAIR Bilateral    w/mesh   INGUINAL HERNIA REPAIR Left 08/03/2017   Procedure: LAPAROSCOPIC LEFT INGUINAL HERNIA REPAIR WITH MESH;  Surgeon: Gaynelle Adu, MD;  Location: [facility] OR;  Service: General;  Laterality: Left;   INGUINAL HERNIA REPAIR Right 08/03/2017   Procedure: HERNIA REPAIR RIGHT INGUINAL ADULT WITH MESH;  Surgeon: Gaynelle Adu, MD;  Location: [facility] OR;  Service: General;  Laterality: Right;   INGUINAL HERNIA REPAIR     Dr. Andrey Campanile 04-24-18   INGUINAL HERNIA REPAIR Right 04/24/2018   Procedure: DIAGNOSTIC LAPAROSCOPIC, OPENREPAIR OF RECURRENT RIGHT INGUINAL HERNIA WITH MESH ERAS PATHWAY;  Surgeon: Gaynelle Adu, MD;  Location: [facility] ORS;  Service: General;  Laterality: Right;   INSERTION OF MESH Bilateral 08/03/2017   Procedure: INSERTION OF MESH;  Surgeon: Gaynelle Adu, MD;  Location: [facility] OR;  Service: General;  Laterality: Bilateral;   NM MYOVIEW LTD     negative   TONSILLECTOMY  1937   TOOTH EXTRACTION  09/16/13   Dr Randel Books    Allergies  Allergen Reactions   Trimethoprim Other (See Comments)    Headaches/ ear pressure    Erythromycin Other (See Comments)    UNSPECIFIED REACTION    Macrodantin [Nitrofurantoin Macrocrystal] Other (See Comments)    UNSPECIFIED REACTION    Penicillins Other (See  Comments)    UNSPECIFIED REACTION  Has patient had a PCN reaction causing immediate rash, facial/tongue/throat swelling, SOB or lightheadedness with hypotension: Unknown Has patient had a PCN reaction causing severe rash involving mucus membranes or skin necrosis: Unknown Has patient had a PCN reaction that required hospitalization: No Has patient had a PCN reaction occurring within the last 10 years: No If all of the above answers are "NO", then may proceed with Cephalosporin use.   Sulfa Antibiotics Other (See Comments)    UNSPECIFIED REACTION    Latex Itching    Outpatient Encounter Medications as of 10/19/2022  Medication Sig   acetaminophen (TYLENOL) 500 MG tablet Take 500 mg by mouth 2 (two) times daily as needed for mild pain or headache.    cephALEXin (KEFLEX) 250 MG capsule Take 250 mg by mouth daily.   Cholecalciferol (VITAMIN D3) 50 MCG (2000 UT) TABS Take by mouth.   clopidogrel (PLAVIX) 75 MG tablet TAKE ONE TABLET BY MOUTH DAILY   Cranberry-Vitamin C-Probiotic (AZO CRANBERRY PO) Take 1 tablet by mouth daily.   famotidine (PEPCID AC) 10 MG chewable tablet Chew 10 mg by mouth at bedtime as needed for heartburn.   metoprolol succinate (TOPROL-XL) 25 MG 24 hr tablet Take one tablet by mouth once daily to regulate heart and control blood pressure.   Multiple Vitamins-Minerals (PRESERVISION AREDS 2 PO) Take 1 capsule by mouth 2 (two) times daily.    tuberculin (TUBERSOL) 5 UNIT/0.1ML injection Inject 0.1 mLs into the skin daily at 12 noon.   vitamin C (ASCORBIC ACID) 500 MG tablet Take 500 mg by mouth daily with supper.    No facility-administered encounter medications on file as of 10/19/2022.    Review of Systems:  Review of Systems  Constitutional:  Negative for activity change and appetite change.  HENT: Negative.    Respiratory:  Negative for cough and shortness of breath.   Cardiovascular:  Negative for leg swelling.  Gastrointestinal:  Negative for constipation.   Genitourinary: Negative.   Musculoskeletal:  Negative for arthralgias, gait problem and myalgias.  Skin: Negative.   Neurological:  Negative for dizziness and weakness.  Psychiatric/Behavioral:  Negative for confusion, dysphoric mood and sleep disturbance.     Health Maintenance  Topic Date Due   DTaP/Tdap/Td (4 - Td or Tdap) 03/20/2020   COVID-19 Vaccine (5 - 2023-24 season) 01/13/2022   INFLUENZA VACCINE  12/14/2022   Medicare Annual Wellness (AWV)  09/06/2023   Pneumonia Vaccine 72+ Years old  Completed   DEXA SCAN  Completed   Zoster Vaccines- Shingrix  Completed   HPV VACCINES  Aged Out    Physical Exam: Vitals:   10/19/22 1737  BP: 136/88  Pulse: 68  Resp: 16  Temp: (!) 97.1 F (36.2 C)  SpO2: 98%  Weight: 144 lb 12.8 oz (65.7 kg)   Body mass index  is 24.85 kg/m. Physical Exam Vitals reviewed.  Constitutional:      Appearance: Normal appearance.  HENT:     Head: Normocephalic.     Nose: Nose normal.     Mouth/Throat:     Mouth: Mucous membranes are moist.     Pharynx: Oropharynx is clear.  Eyes:     Pupils: Pupils are equal, round, and reactive to light.  Cardiovascular:     Rate and Rhythm: Normal rate and regular rhythm.     Pulses: Normal pulses.     Heart sounds: Normal heart sounds. No murmur heard. Pulmonary:     Effort: Pulmonary effort is normal.     Breath sounds: Normal breath sounds.  Abdominal:     General: Abdomen is flat. Bowel sounds are normal.     Palpations: Abdomen is soft.  Musculoskeletal:        General: Swelling present.     Cervical back: Neck supple.  Skin:    General: Skin is warm.  Neurological:     General: No focal deficit present.     Mental Status: She is alert and oriented to person, place, and time.  Psychiatric:        Mood and Affect: Mood normal.        Thought Content: Thought content normal.     Labs reviewed: Basic Metabolic Panel: Recent Labs    03/20/22 0822 07/26/22 1715  NA 141 139  K 4.1 4.2   CL 105 102  CO2 29 27  GLUCOSE 87 100*  BUN 25 18  CREATININE 0.63 0.74  CALCIUM 9.5 9.8   Liver Function Tests: Recent Labs    03/20/22 0822  AST 17  ALT 16  BILITOT 0.6  PROT 6.3   No results for input(s): "LIPASE", "AMYLASE" in the last 8760 hours. No results for input(s): "AMMONIA" in the last 8760 hours. CBC: Recent Labs    07/26/22 1715  WBC 7.0  NEUTROABS 4.1  HGB 13.6  HCT 41.0  MCV 92.6  PLT 207   Lipid Panel: No results for input(s): "CHOL", "HDL", "LDLCALC", "TRIG", "CHOLHDL", "LDLDIRECT" in the last 8760 hours. Lab Results  Component Value Date   HGBA1C 5.3 05/02/2013    Procedures since last visit: No results found.  Assessment/Plan 1. Essential hypertension Controlled on Toprol  2. Macular degeneration of both eyes, unspecified type Doing well  3. Chronic venous insufficiency Ted hoses  4. Recurrent UTI Cranberry and Keflex 5 H/O TIA On Plavix 6 GERD On Pepcid   Labs/tests ordered:  CBC,CMP,TSH Next appt:

## 2022-10-23 DIAGNOSIS — Z9181 History of falling: Secondary | ICD-10-CM | POA: Diagnosis not present

## 2022-10-23 DIAGNOSIS — R278 Other lack of coordination: Secondary | ICD-10-CM | POA: Diagnosis not present

## 2022-10-23 DIAGNOSIS — H3530 Unspecified macular degeneration: Secondary | ICD-10-CM | POA: Diagnosis not present

## 2022-10-23 DIAGNOSIS — G459 Transient cerebral ischemic attack, unspecified: Secondary | ICD-10-CM | POA: Diagnosis not present

## 2022-10-23 DIAGNOSIS — R2681 Unsteadiness on feet: Secondary | ICD-10-CM | POA: Diagnosis not present

## 2022-10-23 DIAGNOSIS — M6281 Muscle weakness (generalized): Secondary | ICD-10-CM | POA: Diagnosis not present

## 2022-10-24 DIAGNOSIS — R2681 Unsteadiness on feet: Secondary | ICD-10-CM | POA: Diagnosis not present

## 2022-10-24 DIAGNOSIS — M6281 Muscle weakness (generalized): Secondary | ICD-10-CM | POA: Diagnosis not present

## 2022-10-24 DIAGNOSIS — G459 Transient cerebral ischemic attack, unspecified: Secondary | ICD-10-CM | POA: Diagnosis not present

## 2022-10-24 DIAGNOSIS — R278 Other lack of coordination: Secondary | ICD-10-CM | POA: Diagnosis not present

## 2022-10-24 DIAGNOSIS — H3530 Unspecified macular degeneration: Secondary | ICD-10-CM | POA: Diagnosis not present

## 2022-10-24 DIAGNOSIS — Z9181 History of falling: Secondary | ICD-10-CM | POA: Diagnosis not present

## 2022-10-26 DIAGNOSIS — R278 Other lack of coordination: Secondary | ICD-10-CM | POA: Diagnosis not present

## 2022-10-26 DIAGNOSIS — Z9181 History of falling: Secondary | ICD-10-CM | POA: Diagnosis not present

## 2022-10-26 DIAGNOSIS — G459 Transient cerebral ischemic attack, unspecified: Secondary | ICD-10-CM | POA: Diagnosis not present

## 2022-10-26 DIAGNOSIS — M6281 Muscle weakness (generalized): Secondary | ICD-10-CM | POA: Diagnosis not present

## 2022-10-26 DIAGNOSIS — H3530 Unspecified macular degeneration: Secondary | ICD-10-CM | POA: Diagnosis not present

## 2022-10-26 DIAGNOSIS — R2681 Unsteadiness on feet: Secondary | ICD-10-CM | POA: Diagnosis not present

## 2022-10-27 DIAGNOSIS — M6281 Muscle weakness (generalized): Secondary | ICD-10-CM | POA: Diagnosis not present

## 2022-10-27 DIAGNOSIS — R2681 Unsteadiness on feet: Secondary | ICD-10-CM | POA: Diagnosis not present

## 2022-10-27 DIAGNOSIS — H3530 Unspecified macular degeneration: Secondary | ICD-10-CM | POA: Diagnosis not present

## 2022-10-27 DIAGNOSIS — Z9181 History of falling: Secondary | ICD-10-CM | POA: Diagnosis not present

## 2022-10-27 DIAGNOSIS — R278 Other lack of coordination: Secondary | ICD-10-CM | POA: Diagnosis not present

## 2022-10-27 DIAGNOSIS — G459 Transient cerebral ischemic attack, unspecified: Secondary | ICD-10-CM | POA: Diagnosis not present

## 2022-10-30 DIAGNOSIS — M6281 Muscle weakness (generalized): Secondary | ICD-10-CM | POA: Diagnosis not present

## 2022-10-30 DIAGNOSIS — H3530 Unspecified macular degeneration: Secondary | ICD-10-CM | POA: Diagnosis not present

## 2022-10-30 DIAGNOSIS — R2681 Unsteadiness on feet: Secondary | ICD-10-CM | POA: Diagnosis not present

## 2022-10-30 DIAGNOSIS — G459 Transient cerebral ischemic attack, unspecified: Secondary | ICD-10-CM | POA: Diagnosis not present

## 2022-10-30 DIAGNOSIS — E039 Hypothyroidism, unspecified: Secondary | ICD-10-CM | POA: Diagnosis not present

## 2022-10-30 DIAGNOSIS — D649 Anemia, unspecified: Secondary | ICD-10-CM | POA: Diagnosis not present

## 2022-10-30 DIAGNOSIS — Z9181 History of falling: Secondary | ICD-10-CM | POA: Diagnosis not present

## 2022-10-30 DIAGNOSIS — R278 Other lack of coordination: Secondary | ICD-10-CM | POA: Diagnosis not present

## 2022-10-31 DIAGNOSIS — R278 Other lack of coordination: Secondary | ICD-10-CM | POA: Diagnosis not present

## 2022-10-31 DIAGNOSIS — L814 Other melanin hyperpigmentation: Secondary | ICD-10-CM | POA: Diagnosis not present

## 2022-10-31 DIAGNOSIS — M6281 Muscle weakness (generalized): Secondary | ICD-10-CM | POA: Diagnosis not present

## 2022-10-31 DIAGNOSIS — R2681 Unsteadiness on feet: Secondary | ICD-10-CM | POA: Diagnosis not present

## 2022-10-31 DIAGNOSIS — L570 Actinic keratosis: Secondary | ICD-10-CM | POA: Diagnosis not present

## 2022-10-31 DIAGNOSIS — D692 Other nonthrombocytopenic purpura: Secondary | ICD-10-CM | POA: Diagnosis not present

## 2022-10-31 DIAGNOSIS — G459 Transient cerebral ischemic attack, unspecified: Secondary | ICD-10-CM | POA: Diagnosis not present

## 2022-10-31 DIAGNOSIS — L821 Other seborrheic keratosis: Secondary | ICD-10-CM | POA: Diagnosis not present

## 2022-10-31 DIAGNOSIS — Z9181 History of falling: Secondary | ICD-10-CM | POA: Diagnosis not present

## 2022-10-31 DIAGNOSIS — H3530 Unspecified macular degeneration: Secondary | ICD-10-CM | POA: Diagnosis not present

## 2022-10-31 LAB — BASIC METABOLIC PANEL
BUN: 29 — AB (ref 4–21)
CO2: 24 — AB (ref 13–22)
Chloride: 104 (ref 99–108)
Creatinine: 0.8 (ref 0.5–1.1)
Glucose: 107
Potassium: 3.7 meq/L (ref 3.5–5.1)
Sodium: 141 (ref 137–147)

## 2022-10-31 LAB — CBC AND DIFFERENTIAL
HCT: 42 (ref 36–46)
Hemoglobin: 13.8 (ref 12.0–16.0)
Platelets: 234 10*3/uL (ref 150–400)
WBC: 10.3

## 2022-10-31 LAB — CBC: RBC: 4.58 (ref 3.87–5.11)

## 2022-10-31 LAB — COMPREHENSIVE METABOLIC PANEL
Albumin: 4.1 (ref 3.5–5.0)
Calcium: 9.5 (ref 8.7–10.7)
Globulin: 2.6

## 2022-10-31 LAB — TSH: TSH: 3.2 (ref 0.41–5.90)

## 2022-10-31 LAB — HEPATIC FUNCTION PANEL
ALT: 18 U/L (ref 7–35)
AST: 21 (ref 13–35)
Alkaline Phosphatase: 71 (ref 25–125)
Bilirubin, Total: 0.4

## 2022-11-02 DIAGNOSIS — M6281 Muscle weakness (generalized): Secondary | ICD-10-CM | POA: Diagnosis not present

## 2022-11-02 DIAGNOSIS — R278 Other lack of coordination: Secondary | ICD-10-CM | POA: Diagnosis not present

## 2022-11-02 DIAGNOSIS — H3530 Unspecified macular degeneration: Secondary | ICD-10-CM | POA: Diagnosis not present

## 2022-11-02 DIAGNOSIS — Z9181 History of falling: Secondary | ICD-10-CM | POA: Diagnosis not present

## 2022-11-02 DIAGNOSIS — R2681 Unsteadiness on feet: Secondary | ICD-10-CM | POA: Diagnosis not present

## 2022-11-02 DIAGNOSIS — G459 Transient cerebral ischemic attack, unspecified: Secondary | ICD-10-CM | POA: Diagnosis not present

## 2022-11-03 DIAGNOSIS — G459 Transient cerebral ischemic attack, unspecified: Secondary | ICD-10-CM | POA: Diagnosis not present

## 2022-11-03 DIAGNOSIS — R2681 Unsteadiness on feet: Secondary | ICD-10-CM | POA: Diagnosis not present

## 2022-11-03 DIAGNOSIS — Z9181 History of falling: Secondary | ICD-10-CM | POA: Diagnosis not present

## 2022-11-03 DIAGNOSIS — M6281 Muscle weakness (generalized): Secondary | ICD-10-CM | POA: Diagnosis not present

## 2022-11-03 DIAGNOSIS — R278 Other lack of coordination: Secondary | ICD-10-CM | POA: Diagnosis not present

## 2022-11-03 DIAGNOSIS — H3530 Unspecified macular degeneration: Secondary | ICD-10-CM | POA: Diagnosis not present

## 2022-11-06 DIAGNOSIS — Z9181 History of falling: Secondary | ICD-10-CM | POA: Diagnosis not present

## 2022-11-06 DIAGNOSIS — G459 Transient cerebral ischemic attack, unspecified: Secondary | ICD-10-CM | POA: Diagnosis not present

## 2022-11-06 DIAGNOSIS — M6281 Muscle weakness (generalized): Secondary | ICD-10-CM | POA: Diagnosis not present

## 2022-11-06 DIAGNOSIS — R2681 Unsteadiness on feet: Secondary | ICD-10-CM | POA: Diagnosis not present

## 2022-11-06 DIAGNOSIS — H3530 Unspecified macular degeneration: Secondary | ICD-10-CM | POA: Diagnosis not present

## 2022-11-06 DIAGNOSIS — R278 Other lack of coordination: Secondary | ICD-10-CM | POA: Diagnosis not present

## 2022-11-07 DIAGNOSIS — R278 Other lack of coordination: Secondary | ICD-10-CM | POA: Diagnosis not present

## 2022-11-07 DIAGNOSIS — R2681 Unsteadiness on feet: Secondary | ICD-10-CM | POA: Diagnosis not present

## 2022-11-07 DIAGNOSIS — M6281 Muscle weakness (generalized): Secondary | ICD-10-CM | POA: Diagnosis not present

## 2022-11-07 DIAGNOSIS — H3530 Unspecified macular degeneration: Secondary | ICD-10-CM | POA: Diagnosis not present

## 2022-11-07 DIAGNOSIS — G459 Transient cerebral ischemic attack, unspecified: Secondary | ICD-10-CM | POA: Diagnosis not present

## 2022-11-07 DIAGNOSIS — Z9181 History of falling: Secondary | ICD-10-CM | POA: Diagnosis not present

## 2022-11-09 DIAGNOSIS — Z9181 History of falling: Secondary | ICD-10-CM | POA: Diagnosis not present

## 2022-11-09 DIAGNOSIS — R278 Other lack of coordination: Secondary | ICD-10-CM | POA: Diagnosis not present

## 2022-11-09 DIAGNOSIS — M6281 Muscle weakness (generalized): Secondary | ICD-10-CM | POA: Diagnosis not present

## 2022-11-09 DIAGNOSIS — H3530 Unspecified macular degeneration: Secondary | ICD-10-CM | POA: Diagnosis not present

## 2022-11-09 DIAGNOSIS — R2681 Unsteadiness on feet: Secondary | ICD-10-CM | POA: Diagnosis not present

## 2022-11-09 DIAGNOSIS — G459 Transient cerebral ischemic attack, unspecified: Secondary | ICD-10-CM | POA: Diagnosis not present

## 2022-11-12 DIAGNOSIS — G459 Transient cerebral ischemic attack, unspecified: Secondary | ICD-10-CM | POA: Diagnosis not present

## 2022-11-12 DIAGNOSIS — H3530 Unspecified macular degeneration: Secondary | ICD-10-CM | POA: Diagnosis not present

## 2022-11-12 DIAGNOSIS — R278 Other lack of coordination: Secondary | ICD-10-CM | POA: Diagnosis not present

## 2022-11-12 DIAGNOSIS — R2681 Unsteadiness on feet: Secondary | ICD-10-CM | POA: Diagnosis not present

## 2022-11-12 DIAGNOSIS — Z9181 History of falling: Secondary | ICD-10-CM | POA: Diagnosis not present

## 2022-11-12 DIAGNOSIS — M6281 Muscle weakness (generalized): Secondary | ICD-10-CM | POA: Diagnosis not present

## 2022-11-13 DIAGNOSIS — H3530 Unspecified macular degeneration: Secondary | ICD-10-CM | POA: Diagnosis not present

## 2022-11-13 DIAGNOSIS — R278 Other lack of coordination: Secondary | ICD-10-CM | POA: Diagnosis not present

## 2022-11-13 DIAGNOSIS — M6281 Muscle weakness (generalized): Secondary | ICD-10-CM | POA: Diagnosis not present

## 2022-11-13 DIAGNOSIS — R41841 Cognitive communication deficit: Secondary | ICD-10-CM | POA: Diagnosis not present

## 2022-11-13 DIAGNOSIS — R2681 Unsteadiness on feet: Secondary | ICD-10-CM | POA: Diagnosis not present

## 2022-11-13 DIAGNOSIS — G459 Transient cerebral ischemic attack, unspecified: Secondary | ICD-10-CM | POA: Diagnosis not present

## 2022-11-13 DIAGNOSIS — Z9181 History of falling: Secondary | ICD-10-CM | POA: Diagnosis not present

## 2022-11-14 DIAGNOSIS — R2681 Unsteadiness on feet: Secondary | ICD-10-CM | POA: Diagnosis not present

## 2022-11-14 DIAGNOSIS — H3530 Unspecified macular degeneration: Secondary | ICD-10-CM | POA: Diagnosis not present

## 2022-11-14 DIAGNOSIS — R278 Other lack of coordination: Secondary | ICD-10-CM | POA: Diagnosis not present

## 2022-11-14 DIAGNOSIS — M6281 Muscle weakness (generalized): Secondary | ICD-10-CM | POA: Diagnosis not present

## 2022-11-14 DIAGNOSIS — Z9181 History of falling: Secondary | ICD-10-CM | POA: Diagnosis not present

## 2022-11-14 DIAGNOSIS — G459 Transient cerebral ischemic attack, unspecified: Secondary | ICD-10-CM | POA: Diagnosis not present

## 2022-11-15 DIAGNOSIS — G459 Transient cerebral ischemic attack, unspecified: Secondary | ICD-10-CM | POA: Diagnosis not present

## 2022-11-15 DIAGNOSIS — R278 Other lack of coordination: Secondary | ICD-10-CM | POA: Diagnosis not present

## 2022-11-15 DIAGNOSIS — Z9181 History of falling: Secondary | ICD-10-CM | POA: Diagnosis not present

## 2022-11-15 DIAGNOSIS — H3530 Unspecified macular degeneration: Secondary | ICD-10-CM | POA: Diagnosis not present

## 2022-11-15 DIAGNOSIS — R2681 Unsteadiness on feet: Secondary | ICD-10-CM | POA: Diagnosis not present

## 2022-11-15 DIAGNOSIS — M6281 Muscle weakness (generalized): Secondary | ICD-10-CM | POA: Diagnosis not present

## 2022-11-16 DIAGNOSIS — G459 Transient cerebral ischemic attack, unspecified: Secondary | ICD-10-CM | POA: Diagnosis not present

## 2022-11-16 DIAGNOSIS — R2681 Unsteadiness on feet: Secondary | ICD-10-CM | POA: Diagnosis not present

## 2022-11-16 DIAGNOSIS — M6281 Muscle weakness (generalized): Secondary | ICD-10-CM | POA: Diagnosis not present

## 2022-11-16 DIAGNOSIS — R278 Other lack of coordination: Secondary | ICD-10-CM | POA: Diagnosis not present

## 2022-11-16 DIAGNOSIS — H3530 Unspecified macular degeneration: Secondary | ICD-10-CM | POA: Diagnosis not present

## 2022-11-16 DIAGNOSIS — Z9181 History of falling: Secondary | ICD-10-CM | POA: Diagnosis not present

## 2022-11-17 DIAGNOSIS — G459 Transient cerebral ischemic attack, unspecified: Secondary | ICD-10-CM | POA: Diagnosis not present

## 2022-11-17 DIAGNOSIS — R278 Other lack of coordination: Secondary | ICD-10-CM | POA: Diagnosis not present

## 2022-11-17 DIAGNOSIS — M6281 Muscle weakness (generalized): Secondary | ICD-10-CM | POA: Diagnosis not present

## 2022-11-17 DIAGNOSIS — H3530 Unspecified macular degeneration: Secondary | ICD-10-CM | POA: Diagnosis not present

## 2022-11-17 DIAGNOSIS — Z9181 History of falling: Secondary | ICD-10-CM | POA: Diagnosis not present

## 2022-11-17 DIAGNOSIS — R2681 Unsteadiness on feet: Secondary | ICD-10-CM | POA: Diagnosis not present

## 2022-11-17 DIAGNOSIS — H353211 Exudative age-related macular degeneration, right eye, with active choroidal neovascularization: Secondary | ICD-10-CM | POA: Diagnosis not present

## 2022-11-20 DIAGNOSIS — G459 Transient cerebral ischemic attack, unspecified: Secondary | ICD-10-CM | POA: Diagnosis not present

## 2022-11-20 DIAGNOSIS — R2681 Unsteadiness on feet: Secondary | ICD-10-CM | POA: Diagnosis not present

## 2022-11-20 DIAGNOSIS — M6281 Muscle weakness (generalized): Secondary | ICD-10-CM | POA: Diagnosis not present

## 2022-11-20 DIAGNOSIS — Z9181 History of falling: Secondary | ICD-10-CM | POA: Diagnosis not present

## 2022-11-20 DIAGNOSIS — R278 Other lack of coordination: Secondary | ICD-10-CM | POA: Diagnosis not present

## 2022-11-20 DIAGNOSIS — H353221 Exudative age-related macular degeneration, left eye, with active choroidal neovascularization: Secondary | ICD-10-CM | POA: Diagnosis not present

## 2022-11-20 DIAGNOSIS — H3530 Unspecified macular degeneration: Secondary | ICD-10-CM | POA: Diagnosis not present

## 2022-11-21 DIAGNOSIS — M6281 Muscle weakness (generalized): Secondary | ICD-10-CM | POA: Diagnosis not present

## 2022-11-21 DIAGNOSIS — H3530 Unspecified macular degeneration: Secondary | ICD-10-CM | POA: Diagnosis not present

## 2022-11-21 DIAGNOSIS — Z9181 History of falling: Secondary | ICD-10-CM | POA: Diagnosis not present

## 2022-11-21 DIAGNOSIS — G459 Transient cerebral ischemic attack, unspecified: Secondary | ICD-10-CM | POA: Diagnosis not present

## 2022-11-21 DIAGNOSIS — R278 Other lack of coordination: Secondary | ICD-10-CM | POA: Diagnosis not present

## 2022-11-21 DIAGNOSIS — R2681 Unsteadiness on feet: Secondary | ICD-10-CM | POA: Diagnosis not present

## 2022-11-22 DIAGNOSIS — Z9181 History of falling: Secondary | ICD-10-CM | POA: Diagnosis not present

## 2022-11-22 DIAGNOSIS — G459 Transient cerebral ischemic attack, unspecified: Secondary | ICD-10-CM | POA: Diagnosis not present

## 2022-11-22 DIAGNOSIS — R278 Other lack of coordination: Secondary | ICD-10-CM | POA: Diagnosis not present

## 2022-11-22 DIAGNOSIS — M6281 Muscle weakness (generalized): Secondary | ICD-10-CM | POA: Diagnosis not present

## 2022-11-22 DIAGNOSIS — H3530 Unspecified macular degeneration: Secondary | ICD-10-CM | POA: Diagnosis not present

## 2022-11-22 DIAGNOSIS — R2681 Unsteadiness on feet: Secondary | ICD-10-CM | POA: Diagnosis not present

## 2022-11-23 DIAGNOSIS — G459 Transient cerebral ischemic attack, unspecified: Secondary | ICD-10-CM | POA: Diagnosis not present

## 2022-11-23 DIAGNOSIS — M6281 Muscle weakness (generalized): Secondary | ICD-10-CM | POA: Diagnosis not present

## 2022-11-23 DIAGNOSIS — H3530 Unspecified macular degeneration: Secondary | ICD-10-CM | POA: Diagnosis not present

## 2022-11-23 DIAGNOSIS — Z9181 History of falling: Secondary | ICD-10-CM | POA: Diagnosis not present

## 2022-11-23 DIAGNOSIS — R2681 Unsteadiness on feet: Secondary | ICD-10-CM | POA: Diagnosis not present

## 2022-11-23 DIAGNOSIS — R278 Other lack of coordination: Secondary | ICD-10-CM | POA: Diagnosis not present

## 2022-11-27 DIAGNOSIS — H3530 Unspecified macular degeneration: Secondary | ICD-10-CM | POA: Diagnosis not present

## 2022-11-27 DIAGNOSIS — Z9181 History of falling: Secondary | ICD-10-CM | POA: Diagnosis not present

## 2022-11-27 DIAGNOSIS — R278 Other lack of coordination: Secondary | ICD-10-CM | POA: Diagnosis not present

## 2022-11-27 DIAGNOSIS — G459 Transient cerebral ischemic attack, unspecified: Secondary | ICD-10-CM | POA: Diagnosis not present

## 2022-11-27 DIAGNOSIS — R2681 Unsteadiness on feet: Secondary | ICD-10-CM | POA: Diagnosis not present

## 2022-11-27 DIAGNOSIS — M6281 Muscle weakness (generalized): Secondary | ICD-10-CM | POA: Diagnosis not present

## 2022-11-29 DIAGNOSIS — R2681 Unsteadiness on feet: Secondary | ICD-10-CM | POA: Diagnosis not present

## 2022-11-29 DIAGNOSIS — M6281 Muscle weakness (generalized): Secondary | ICD-10-CM | POA: Diagnosis not present

## 2022-11-29 DIAGNOSIS — G459 Transient cerebral ischemic attack, unspecified: Secondary | ICD-10-CM | POA: Diagnosis not present

## 2022-11-29 DIAGNOSIS — R278 Other lack of coordination: Secondary | ICD-10-CM | POA: Diagnosis not present

## 2022-11-29 DIAGNOSIS — H3530 Unspecified macular degeneration: Secondary | ICD-10-CM | POA: Diagnosis not present

## 2022-11-29 DIAGNOSIS — Z9181 History of falling: Secondary | ICD-10-CM | POA: Diagnosis not present

## 2022-11-30 DIAGNOSIS — Z9181 History of falling: Secondary | ICD-10-CM | POA: Diagnosis not present

## 2022-11-30 DIAGNOSIS — R2681 Unsteadiness on feet: Secondary | ICD-10-CM | POA: Diagnosis not present

## 2022-11-30 DIAGNOSIS — G459 Transient cerebral ischemic attack, unspecified: Secondary | ICD-10-CM | POA: Diagnosis not present

## 2022-11-30 DIAGNOSIS — M6281 Muscle weakness (generalized): Secondary | ICD-10-CM | POA: Diagnosis not present

## 2022-11-30 DIAGNOSIS — R278 Other lack of coordination: Secondary | ICD-10-CM | POA: Diagnosis not present

## 2022-11-30 DIAGNOSIS — H3530 Unspecified macular degeneration: Secondary | ICD-10-CM | POA: Diagnosis not present

## 2022-12-01 DIAGNOSIS — M6281 Muscle weakness (generalized): Secondary | ICD-10-CM | POA: Diagnosis not present

## 2022-12-01 DIAGNOSIS — G459 Transient cerebral ischemic attack, unspecified: Secondary | ICD-10-CM | POA: Diagnosis not present

## 2022-12-01 DIAGNOSIS — H3530 Unspecified macular degeneration: Secondary | ICD-10-CM | POA: Diagnosis not present

## 2022-12-01 DIAGNOSIS — R278 Other lack of coordination: Secondary | ICD-10-CM | POA: Diagnosis not present

## 2022-12-01 DIAGNOSIS — Z9181 History of falling: Secondary | ICD-10-CM | POA: Diagnosis not present

## 2022-12-01 DIAGNOSIS — R2681 Unsteadiness on feet: Secondary | ICD-10-CM | POA: Diagnosis not present

## 2022-12-04 DIAGNOSIS — M6281 Muscle weakness (generalized): Secondary | ICD-10-CM | POA: Diagnosis not present

## 2022-12-04 DIAGNOSIS — R2681 Unsteadiness on feet: Secondary | ICD-10-CM | POA: Diagnosis not present

## 2022-12-04 DIAGNOSIS — R278 Other lack of coordination: Secondary | ICD-10-CM | POA: Diagnosis not present

## 2022-12-04 DIAGNOSIS — G459 Transient cerebral ischemic attack, unspecified: Secondary | ICD-10-CM | POA: Diagnosis not present

## 2022-12-04 DIAGNOSIS — H3530 Unspecified macular degeneration: Secondary | ICD-10-CM | POA: Diagnosis not present

## 2022-12-04 DIAGNOSIS — Z9181 History of falling: Secondary | ICD-10-CM | POA: Diagnosis not present

## 2022-12-06 DIAGNOSIS — H3530 Unspecified macular degeneration: Secondary | ICD-10-CM | POA: Diagnosis not present

## 2022-12-06 DIAGNOSIS — G459 Transient cerebral ischemic attack, unspecified: Secondary | ICD-10-CM | POA: Diagnosis not present

## 2022-12-06 DIAGNOSIS — R2681 Unsteadiness on feet: Secondary | ICD-10-CM | POA: Diagnosis not present

## 2022-12-06 DIAGNOSIS — Z9181 History of falling: Secondary | ICD-10-CM | POA: Diagnosis not present

## 2022-12-06 DIAGNOSIS — R278 Other lack of coordination: Secondary | ICD-10-CM | POA: Diagnosis not present

## 2022-12-06 DIAGNOSIS — M6281 Muscle weakness (generalized): Secondary | ICD-10-CM | POA: Diagnosis not present

## 2022-12-07 DIAGNOSIS — Z9181 History of falling: Secondary | ICD-10-CM | POA: Diagnosis not present

## 2022-12-07 DIAGNOSIS — H3530 Unspecified macular degeneration: Secondary | ICD-10-CM | POA: Diagnosis not present

## 2022-12-07 DIAGNOSIS — R2681 Unsteadiness on feet: Secondary | ICD-10-CM | POA: Diagnosis not present

## 2022-12-07 DIAGNOSIS — M6281 Muscle weakness (generalized): Secondary | ICD-10-CM | POA: Diagnosis not present

## 2022-12-07 DIAGNOSIS — G459 Transient cerebral ischemic attack, unspecified: Secondary | ICD-10-CM | POA: Diagnosis not present

## 2022-12-07 DIAGNOSIS — R278 Other lack of coordination: Secondary | ICD-10-CM | POA: Diagnosis not present

## 2022-12-12 DIAGNOSIS — M6281 Muscle weakness (generalized): Secondary | ICD-10-CM | POA: Diagnosis not present

## 2022-12-12 DIAGNOSIS — Z9181 History of falling: Secondary | ICD-10-CM | POA: Diagnosis not present

## 2022-12-12 DIAGNOSIS — H3530 Unspecified macular degeneration: Secondary | ICD-10-CM | POA: Diagnosis not present

## 2022-12-12 DIAGNOSIS — R2681 Unsteadiness on feet: Secondary | ICD-10-CM | POA: Diagnosis not present

## 2022-12-12 DIAGNOSIS — R278 Other lack of coordination: Secondary | ICD-10-CM | POA: Diagnosis not present

## 2022-12-12 DIAGNOSIS — G459 Transient cerebral ischemic attack, unspecified: Secondary | ICD-10-CM | POA: Diagnosis not present

## 2022-12-13 DIAGNOSIS — R278 Other lack of coordination: Secondary | ICD-10-CM | POA: Diagnosis not present

## 2022-12-13 DIAGNOSIS — G459 Transient cerebral ischemic attack, unspecified: Secondary | ICD-10-CM | POA: Diagnosis not present

## 2022-12-13 DIAGNOSIS — Z9181 History of falling: Secondary | ICD-10-CM | POA: Diagnosis not present

## 2022-12-13 DIAGNOSIS — R2681 Unsteadiness on feet: Secondary | ICD-10-CM | POA: Diagnosis not present

## 2022-12-13 DIAGNOSIS — M6281 Muscle weakness (generalized): Secondary | ICD-10-CM | POA: Diagnosis not present

## 2022-12-13 DIAGNOSIS — H3530 Unspecified macular degeneration: Secondary | ICD-10-CM | POA: Diagnosis not present

## 2022-12-18 DIAGNOSIS — H35453 Secondary pigmentary degeneration, bilateral: Secondary | ICD-10-CM | POA: Diagnosis not present

## 2022-12-18 DIAGNOSIS — H35363 Drusen (degenerative) of macula, bilateral: Secondary | ICD-10-CM | POA: Diagnosis not present

## 2022-12-18 DIAGNOSIS — Z961 Presence of intraocular lens: Secondary | ICD-10-CM | POA: Diagnosis not present

## 2022-12-18 DIAGNOSIS — H353231 Exudative age-related macular degeneration, bilateral, with active choroidal neovascularization: Secondary | ICD-10-CM | POA: Diagnosis not present

## 2022-12-19 DIAGNOSIS — R278 Other lack of coordination: Secondary | ICD-10-CM | POA: Diagnosis not present

## 2022-12-19 DIAGNOSIS — G459 Transient cerebral ischemic attack, unspecified: Secondary | ICD-10-CM | POA: Diagnosis not present

## 2022-12-19 DIAGNOSIS — R41841 Cognitive communication deficit: Secondary | ICD-10-CM | POA: Diagnosis not present

## 2022-12-19 DIAGNOSIS — H3530 Unspecified macular degeneration: Secondary | ICD-10-CM | POA: Diagnosis not present

## 2022-12-19 DIAGNOSIS — R2681 Unsteadiness on feet: Secondary | ICD-10-CM | POA: Diagnosis not present

## 2022-12-19 DIAGNOSIS — Z9181 History of falling: Secondary | ICD-10-CM | POA: Diagnosis not present

## 2022-12-19 DIAGNOSIS — M6281 Muscle weakness (generalized): Secondary | ICD-10-CM | POA: Diagnosis not present

## 2022-12-20 DIAGNOSIS — R278 Other lack of coordination: Secondary | ICD-10-CM | POA: Diagnosis not present

## 2022-12-20 DIAGNOSIS — R2681 Unsteadiness on feet: Secondary | ICD-10-CM | POA: Diagnosis not present

## 2022-12-20 DIAGNOSIS — Z9181 History of falling: Secondary | ICD-10-CM | POA: Diagnosis not present

## 2022-12-20 DIAGNOSIS — M6281 Muscle weakness (generalized): Secondary | ICD-10-CM | POA: Diagnosis not present

## 2022-12-20 DIAGNOSIS — H3530 Unspecified macular degeneration: Secondary | ICD-10-CM | POA: Diagnosis not present

## 2022-12-20 DIAGNOSIS — G459 Transient cerebral ischemic attack, unspecified: Secondary | ICD-10-CM | POA: Diagnosis not present

## 2022-12-21 DIAGNOSIS — M6281 Muscle weakness (generalized): Secondary | ICD-10-CM | POA: Diagnosis not present

## 2022-12-21 DIAGNOSIS — R2681 Unsteadiness on feet: Secondary | ICD-10-CM | POA: Diagnosis not present

## 2022-12-21 DIAGNOSIS — G459 Transient cerebral ischemic attack, unspecified: Secondary | ICD-10-CM | POA: Diagnosis not present

## 2022-12-21 DIAGNOSIS — H3530 Unspecified macular degeneration: Secondary | ICD-10-CM | POA: Diagnosis not present

## 2022-12-21 DIAGNOSIS — Z9181 History of falling: Secondary | ICD-10-CM | POA: Diagnosis not present

## 2022-12-21 DIAGNOSIS — R278 Other lack of coordination: Secondary | ICD-10-CM | POA: Diagnosis not present

## 2022-12-22 DIAGNOSIS — H3530 Unspecified macular degeneration: Secondary | ICD-10-CM | POA: Diagnosis not present

## 2022-12-22 DIAGNOSIS — M6281 Muscle weakness (generalized): Secondary | ICD-10-CM | POA: Diagnosis not present

## 2022-12-22 DIAGNOSIS — H353221 Exudative age-related macular degeneration, left eye, with active choroidal neovascularization: Secondary | ICD-10-CM | POA: Diagnosis not present

## 2022-12-22 DIAGNOSIS — Z9181 History of falling: Secondary | ICD-10-CM | POA: Diagnosis not present

## 2022-12-22 DIAGNOSIS — R278 Other lack of coordination: Secondary | ICD-10-CM | POA: Diagnosis not present

## 2022-12-22 DIAGNOSIS — R2681 Unsteadiness on feet: Secondary | ICD-10-CM | POA: Diagnosis not present

## 2022-12-22 DIAGNOSIS — G459 Transient cerebral ischemic attack, unspecified: Secondary | ICD-10-CM | POA: Diagnosis not present

## 2022-12-25 DIAGNOSIS — R278 Other lack of coordination: Secondary | ICD-10-CM | POA: Diagnosis not present

## 2022-12-25 DIAGNOSIS — M6281 Muscle weakness (generalized): Secondary | ICD-10-CM | POA: Diagnosis not present

## 2022-12-25 DIAGNOSIS — R2681 Unsteadiness on feet: Secondary | ICD-10-CM | POA: Diagnosis not present

## 2022-12-25 DIAGNOSIS — H3530 Unspecified macular degeneration: Secondary | ICD-10-CM | POA: Diagnosis not present

## 2022-12-25 DIAGNOSIS — G459 Transient cerebral ischemic attack, unspecified: Secondary | ICD-10-CM | POA: Diagnosis not present

## 2022-12-25 DIAGNOSIS — Z9181 History of falling: Secondary | ICD-10-CM | POA: Diagnosis not present

## 2022-12-26 DIAGNOSIS — R278 Other lack of coordination: Secondary | ICD-10-CM | POA: Diagnosis not present

## 2022-12-26 DIAGNOSIS — H3530 Unspecified macular degeneration: Secondary | ICD-10-CM | POA: Diagnosis not present

## 2022-12-26 DIAGNOSIS — M6281 Muscle weakness (generalized): Secondary | ICD-10-CM | POA: Diagnosis not present

## 2022-12-26 DIAGNOSIS — Z9181 History of falling: Secondary | ICD-10-CM | POA: Diagnosis not present

## 2022-12-26 DIAGNOSIS — G459 Transient cerebral ischemic attack, unspecified: Secondary | ICD-10-CM | POA: Diagnosis not present

## 2022-12-26 DIAGNOSIS — R2681 Unsteadiness on feet: Secondary | ICD-10-CM | POA: Diagnosis not present

## 2022-12-27 ENCOUNTER — Encounter: Payer: Self-pay | Admitting: Family Medicine

## 2022-12-27 ENCOUNTER — Encounter: Payer: Medicare Other | Admitting: Family Medicine

## 2022-12-27 ENCOUNTER — Non-Acute Institutional Stay (INDEPENDENT_AMBULATORY_CARE_PROVIDER_SITE_OTHER): Payer: Medicare Other | Admitting: Family Medicine

## 2022-12-27 VITALS — BP 120/80 | HR 67 | Temp 96.7°F | Resp 16 | Ht 64.0 in | Wt 153.0 lb

## 2022-12-27 DIAGNOSIS — H9193 Unspecified hearing loss, bilateral: Secondary | ICD-10-CM | POA: Diagnosis not present

## 2022-12-28 DIAGNOSIS — R278 Other lack of coordination: Secondary | ICD-10-CM | POA: Diagnosis not present

## 2022-12-28 DIAGNOSIS — H9190 Unspecified hearing loss, unspecified ear: Secondary | ICD-10-CM | POA: Insufficient documentation

## 2022-12-28 DIAGNOSIS — Z9181 History of falling: Secondary | ICD-10-CM | POA: Diagnosis not present

## 2022-12-28 DIAGNOSIS — M6281 Muscle weakness (generalized): Secondary | ICD-10-CM | POA: Diagnosis not present

## 2022-12-28 DIAGNOSIS — R2681 Unsteadiness on feet: Secondary | ICD-10-CM | POA: Diagnosis not present

## 2022-12-28 DIAGNOSIS — G459 Transient cerebral ischemic attack, unspecified: Secondary | ICD-10-CM | POA: Diagnosis not present

## 2022-12-28 DIAGNOSIS — H3530 Unspecified macular degeneration: Secondary | ICD-10-CM | POA: Diagnosis not present

## 2022-12-28 NOTE — Progress Notes (Signed)
Provider:  Jacalyn Lefevre, MD  Careteam: Patient Care Team: Mahlon Gammon, MD as PCP - General (Internal Medicine) Runell Gess, MD as PCP - Cardiology (Cardiology) Carman Ching, MD (Inactive) as Consulting Physician (Gastroenterology) Esaw Dace, MD as Attending Physician (Urology) Runell Gess, MD as Consulting Physician (Cardiology) Drema Halon, MD (Inactive) as Consulting Physician (Otolaryngology) Elizebeth Koller, DDS (Dentistry) Mckinley Jewel, MD as Consulting Physician (Ophthalmology) Bradly Bienenstock, MD as Consulting Physician (Orthopedic Surgery) Gaynelle Adu, MD as Consulting Physician (General Surgery)  [name]:  [facility]  Advanced Directive information Does Patient Have a Medical Advance Directive?: Yes, Type of Advance Directive: Healthcare Power of [place];Living will;Out of facility DNR (pink MOST or yellow form), Does patient want to make changes to medical advance directive?: No - Patient declined  Allergies  Allergen Reactions   Trimethoprim Other (See Comments)    Headaches/ ear pressure    Erythromycin Other (See Comments)    UNSPECIFIED REACTION    Macrodantin [Nitrofurantoin Macrocrystal] Other (See Comments)    UNSPECIFIED REACTION    Penicillins Other (See Comments)    UNSPECIFIED REACTION  Has patient had a PCN reaction causing immediate rash, facial/tongue/throat swelling, SOB or lightheadedness with hypotension: Unknown Has patient had a PCN reaction causing severe rash involving mucus membranes or skin necrosis: Unknown Has patient had a PCN reaction that required hospitalization: No Has patient had a PCN reaction occurring within the last 10 years: No If all of the above answers are "NO", then may proceed with Cephalosporin use.   Sulfa Antibiotics Other (See Comments)    UNSPECIFIED REACTION    Latex Itching    Chief Complaint  Patient presents with   Acute Visit    Patient has concerns and  questions for provider.      HPI: Patient is a 89 y.o. female . Does not have appt today but confused  C/O decreased hearing and wondering about cerumen as etiology  Review of Systems:  Review of Systems  HENT:  Positive for hearing loss.   All other systems reviewed and are negative.   Past Medical History:  Diagnosis Date   Cervicalgia    Chest pain at rest 01/07/2012   Displacement of cervical intervertebral disc without myelopathy    Diverticulosis of colon (without mention of hemorrhage)    Female stress incontinence    GERD (gastroesophageal reflux disease)    no peds occasional pepcid   Inguinal hernia without mention of obstruction or gangrene, unilateral or unspecified, (not specified as recurrent)    Internal hemorrhoids without mention of complication    Lumbago    Macular degeneration (senile) of retina, unspecified    Osteoarthrosis, unspecified whether generalized or localized, unspecified site    Other and unspecified hyperlipidemia    Pain in joint, hand    Palpitations    Reflux esophagitis    Scoliosis (and kyphoscoliosis), idiopathic    Senile osteoporosis    Skin disorder    Thyroid disease    TIA (transient ischemic attack)    Unruptured popliteal cyst 06/24/2014   Right knee    Unspecified essential hypertension    Unspecified glaucoma(365.9)    Unspecified hypothyroidism    Unspecified vitamin D deficiency    Past Surgical History:  Procedure Laterality Date   ABDOMINAL HYSTERECTOMY  1975   Dr Dewaine Conger   APPENDECTOMY  1988   cardiolite myocardial perfusion study     DOPPLER ECHOCARDIOGRAPHY     EYE SURGERY  Bilateral 2009   cataract removed right eye, Dr Emmit Pomfret   FOOT SURGERY  august 2013   Hewitt, MD   INGUINAL HERNIA REPAIR Bilateral    w/mesh   INGUINAL HERNIA REPAIR Left 08/03/2017   Procedure: LAPAROSCOPIC LEFT INGUINAL HERNIA REPAIR WITH MESH;  Surgeon: Gaynelle Adu, MD;  Location: [facility] OR;  Service: General;  Laterality: Left;   INGUINAL  HERNIA REPAIR Right 08/03/2017   Procedure: HERNIA REPAIR RIGHT INGUINAL ADULT WITH MESH;  Surgeon: Gaynelle Adu, MD;  Location: [facility] OR;  Service: General;  Laterality: Right;   INGUINAL HERNIA REPAIR     Dr. Andrey Campanile 04-24-18   INGUINAL HERNIA REPAIR Right 04/24/2018   Procedure: DIAGNOSTIC LAPAROSCOPIC, OPENREPAIR OF RECURRENT RIGHT INGUINAL HERNIA WITH MESH ERAS PATHWAY;  Surgeon: Gaynelle Adu, MD;  Location: [facility] ORS;  Service: General;  Laterality: Right;   INSERTION OF MESH Bilateral 08/03/2017   Procedure: INSERTION OF MESH;  Surgeon: Gaynelle Adu, MD;  Location: [facility] OR;  Service: General;  Laterality: Bilateral;   NM MYOVIEW LTD     negative   TONSILLECTOMY  1937   TOOTH EXTRACTION  09/16/13   Dr Randel Books   Social History:   reports that she has never smoked. She has never used smokeless tobacco. She reports that she does not drink alcohol and does not use drugs.  Family History  Problem Relation Age of Onset   CAD Mother    Parkinson's disease Mother    Cancer Father        colon cancer   Parkinson's disease Father    Stroke Maternal Grandmother     Medications: Patient's Medications  New Prescriptions   No medications on file  Previous Medications   ACETAMINOPHEN (TYLENOL) 500 MG TABLET    Take 500 mg by mouth 2 (two) times daily as needed for mild pain or headache.    CEPHALEXIN (KEFLEX) 250 MG CAPSULE    Take 250 mg by mouth daily.   CHOLECALCIFEROL (VITAMIN D3) 50 MCG (2000 UT) TABS    Take by mouth.   CLOPIDOGREL (PLAVIX) 75 MG TABLET    TAKE ONE TABLET BY MOUTH DAILY   CRANBERRY-VITAMIN C-PROBIOTIC (AZO CRANBERRY PO)    Take 1 tablet by mouth daily.   FAMOTIDINE (PEPCID AC) 10 MG CHEWABLE TABLET    Chew 10 mg by mouth at bedtime as needed for heartburn.   METOPROLOL SUCCINATE (TOPROL-XL) 25 MG 24 HR TABLET    Take one tablet by mouth once daily to regulate heart and control blood pressure.   MULTIPLE VITAMINS-MINERALS (PRESERVISION AREDS 2 PO)    Take 1 capsule by mouth 2  (two) times daily.    TUBERCULIN (TUBERSOL) 5 UNIT/0.1ML INJECTION    Inject 0.1 mLs into the skin daily at 12 noon.   VITAMIN C (ASCORBIC ACID) 500 MG TABLET    Take 500 mg by mouth daily with supper.   Modified Medications   No medications on file  Discontinued Medications   No medications on file    Physical Exam:  Vitals:   12/27/22 1352  BP: 120/80  Pulse: 67  Resp: 16  Temp: (!) 96.7 F (35.9 C)  SpO2: 97%  Weight: 153 lb (69.4 kg)  Height: 5\' 4"  (1.626 m)   Body mass index is 26.26 kg/m. Wt Readings from Last 3 Encounters:  12/27/22 153 lb (69.4 kg)  10/19/22 144 lb 12.8 oz (65.7 kg)  09/06/22 147 lb (66.7 kg)    Physical Exam Vitals reviewed.  Constitutional:  Appearance: Normal appearance.  HENT:     Head:     Comments: Both ears with impacted cerumen Attempted irrigation without success Will returm after using Debrox for 1 week Neurological:     Mental Status: She is alert.     Labs reviewed: Basic Metabolic Panel: Recent Labs    03/20/22 0822 07/26/22 1715  NA 141 139  K 4.1 4.2  CL 105 102  CO2 29 27  GLUCOSE 87 100*  BUN 25 18  CREATININE 0.63 0.74  CALCIUM 9.5 9.8   Liver Function Tests: Recent Labs    03/20/22 0822  AST 17  ALT 16  BILITOT 0.6  PROT 6.3   No results for input(s): "LIPASE", "AMYLASE" in the last 8760 hours. No results for input(s): "AMMONIA" in the last 8760 hours. CBC: Recent Labs    07/26/22 1715  WBC 7.0  NEUTROABS 4.1  HGB 13.6  HCT 41.0  MCV 92.6  PLT 207   Lipid Panel: No results for input(s): "CHOL", "HDL", "LDLCALC", "TRIG", "CHOLHDL", "LDLDIRECT" in the last 8760 hours. TSH: No results for input(s): "TSH" in the last 8760 hours. A1C: Lab Results  Component Value Date   HGBA1C 5.3 05/02/2013     Assessment/Plan  1. Bilateral hearing loss, unspecified hearing loss type Cerumen impaction. Repeat irrigation post 1 week using ear wax softener   Jacalyn Lefevre, MD [facility] [phone]

## 2023-01-01 DIAGNOSIS — R2681 Unsteadiness on feet: Secondary | ICD-10-CM | POA: Diagnosis not present

## 2023-01-01 DIAGNOSIS — R278 Other lack of coordination: Secondary | ICD-10-CM | POA: Diagnosis not present

## 2023-01-01 DIAGNOSIS — M6281 Muscle weakness (generalized): Secondary | ICD-10-CM | POA: Diagnosis not present

## 2023-01-01 DIAGNOSIS — G459 Transient cerebral ischemic attack, unspecified: Secondary | ICD-10-CM | POA: Diagnosis not present

## 2023-01-01 DIAGNOSIS — H3530 Unspecified macular degeneration: Secondary | ICD-10-CM | POA: Diagnosis not present

## 2023-01-01 DIAGNOSIS — Z9181 History of falling: Secondary | ICD-10-CM | POA: Diagnosis not present

## 2023-01-02 DIAGNOSIS — H3530 Unspecified macular degeneration: Secondary | ICD-10-CM | POA: Diagnosis not present

## 2023-01-02 DIAGNOSIS — Z9181 History of falling: Secondary | ICD-10-CM | POA: Diagnosis not present

## 2023-01-02 DIAGNOSIS — G459 Transient cerebral ischemic attack, unspecified: Secondary | ICD-10-CM | POA: Diagnosis not present

## 2023-01-02 DIAGNOSIS — R2681 Unsteadiness on feet: Secondary | ICD-10-CM | POA: Diagnosis not present

## 2023-01-02 DIAGNOSIS — M6281 Muscle weakness (generalized): Secondary | ICD-10-CM | POA: Diagnosis not present

## 2023-01-02 DIAGNOSIS — R278 Other lack of coordination: Secondary | ICD-10-CM | POA: Diagnosis not present

## 2023-01-04 DIAGNOSIS — R278 Other lack of coordination: Secondary | ICD-10-CM | POA: Diagnosis not present

## 2023-01-04 DIAGNOSIS — H3530 Unspecified macular degeneration: Secondary | ICD-10-CM | POA: Diagnosis not present

## 2023-01-04 DIAGNOSIS — M6281 Muscle weakness (generalized): Secondary | ICD-10-CM | POA: Diagnosis not present

## 2023-01-04 DIAGNOSIS — Z9181 History of falling: Secondary | ICD-10-CM | POA: Diagnosis not present

## 2023-01-04 DIAGNOSIS — R2681 Unsteadiness on feet: Secondary | ICD-10-CM | POA: Diagnosis not present

## 2023-01-04 DIAGNOSIS — G459 Transient cerebral ischemic attack, unspecified: Secondary | ICD-10-CM | POA: Diagnosis not present

## 2023-01-05 DIAGNOSIS — R2681 Unsteadiness on feet: Secondary | ICD-10-CM | POA: Diagnosis not present

## 2023-01-05 DIAGNOSIS — H3530 Unspecified macular degeneration: Secondary | ICD-10-CM | POA: Diagnosis not present

## 2023-01-05 DIAGNOSIS — M6281 Muscle weakness (generalized): Secondary | ICD-10-CM | POA: Diagnosis not present

## 2023-01-05 DIAGNOSIS — Z9181 History of falling: Secondary | ICD-10-CM | POA: Diagnosis not present

## 2023-01-05 DIAGNOSIS — R278 Other lack of coordination: Secondary | ICD-10-CM | POA: Diagnosis not present

## 2023-01-05 DIAGNOSIS — G459 Transient cerebral ischemic attack, unspecified: Secondary | ICD-10-CM | POA: Diagnosis not present

## 2023-01-08 DIAGNOSIS — Z9181 History of falling: Secondary | ICD-10-CM | POA: Diagnosis not present

## 2023-01-08 DIAGNOSIS — H3530 Unspecified macular degeneration: Secondary | ICD-10-CM | POA: Diagnosis not present

## 2023-01-08 DIAGNOSIS — R278 Other lack of coordination: Secondary | ICD-10-CM | POA: Diagnosis not present

## 2023-01-08 DIAGNOSIS — G459 Transient cerebral ischemic attack, unspecified: Secondary | ICD-10-CM | POA: Diagnosis not present

## 2023-01-08 DIAGNOSIS — M6281 Muscle weakness (generalized): Secondary | ICD-10-CM | POA: Diagnosis not present

## 2023-01-08 DIAGNOSIS — R2681 Unsteadiness on feet: Secondary | ICD-10-CM | POA: Diagnosis not present

## 2023-01-09 DIAGNOSIS — H3530 Unspecified macular degeneration: Secondary | ICD-10-CM | POA: Diagnosis not present

## 2023-01-09 DIAGNOSIS — M6281 Muscle weakness (generalized): Secondary | ICD-10-CM | POA: Diagnosis not present

## 2023-01-09 DIAGNOSIS — R278 Other lack of coordination: Secondary | ICD-10-CM | POA: Diagnosis not present

## 2023-01-09 DIAGNOSIS — Z9181 History of falling: Secondary | ICD-10-CM | POA: Diagnosis not present

## 2023-01-09 DIAGNOSIS — R2681 Unsteadiness on feet: Secondary | ICD-10-CM | POA: Diagnosis not present

## 2023-01-09 DIAGNOSIS — G459 Transient cerebral ischemic attack, unspecified: Secondary | ICD-10-CM | POA: Diagnosis not present

## 2023-01-11 DIAGNOSIS — H3530 Unspecified macular degeneration: Secondary | ICD-10-CM | POA: Diagnosis not present

## 2023-01-11 DIAGNOSIS — R2681 Unsteadiness on feet: Secondary | ICD-10-CM | POA: Diagnosis not present

## 2023-01-11 DIAGNOSIS — M6281 Muscle weakness (generalized): Secondary | ICD-10-CM | POA: Diagnosis not present

## 2023-01-11 DIAGNOSIS — G459 Transient cerebral ischemic attack, unspecified: Secondary | ICD-10-CM | POA: Diagnosis not present

## 2023-01-11 DIAGNOSIS — R278 Other lack of coordination: Secondary | ICD-10-CM | POA: Diagnosis not present

## 2023-01-11 DIAGNOSIS — Z9181 History of falling: Secondary | ICD-10-CM | POA: Diagnosis not present

## 2023-01-15 DIAGNOSIS — R2681 Unsteadiness on feet: Secondary | ICD-10-CM | POA: Diagnosis not present

## 2023-01-15 DIAGNOSIS — M6281 Muscle weakness (generalized): Secondary | ICD-10-CM | POA: Diagnosis not present

## 2023-01-15 DIAGNOSIS — R278 Other lack of coordination: Secondary | ICD-10-CM | POA: Diagnosis not present

## 2023-01-15 DIAGNOSIS — Z9181 History of falling: Secondary | ICD-10-CM | POA: Diagnosis not present

## 2023-01-16 DIAGNOSIS — M6281 Muscle weakness (generalized): Secondary | ICD-10-CM | POA: Diagnosis not present

## 2023-01-16 DIAGNOSIS — R278 Other lack of coordination: Secondary | ICD-10-CM | POA: Diagnosis not present

## 2023-01-16 DIAGNOSIS — Z9181 History of falling: Secondary | ICD-10-CM | POA: Diagnosis not present

## 2023-01-16 DIAGNOSIS — R2681 Unsteadiness on feet: Secondary | ICD-10-CM | POA: Diagnosis not present

## 2023-01-17 DIAGNOSIS — H353211 Exudative age-related macular degeneration, right eye, with active choroidal neovascularization: Secondary | ICD-10-CM | POA: Diagnosis not present

## 2023-01-18 DIAGNOSIS — R278 Other lack of coordination: Secondary | ICD-10-CM | POA: Diagnosis not present

## 2023-01-18 DIAGNOSIS — R2681 Unsteadiness on feet: Secondary | ICD-10-CM | POA: Diagnosis not present

## 2023-01-18 DIAGNOSIS — M6281 Muscle weakness (generalized): Secondary | ICD-10-CM | POA: Diagnosis not present

## 2023-01-18 DIAGNOSIS — Z9181 History of falling: Secondary | ICD-10-CM | POA: Diagnosis not present

## 2023-01-22 ENCOUNTER — Non-Acute Institutional Stay: Payer: Self-pay | Admitting: Orthopedic Surgery

## 2023-01-22 ENCOUNTER — Encounter: Payer: Self-pay | Admitting: Orthopedic Surgery

## 2023-01-22 DIAGNOSIS — R2681 Unsteadiness on feet: Secondary | ICD-10-CM | POA: Diagnosis not present

## 2023-01-22 DIAGNOSIS — R278 Other lack of coordination: Secondary | ICD-10-CM | POA: Diagnosis not present

## 2023-01-22 DIAGNOSIS — I872 Venous insufficiency (chronic) (peripheral): Secondary | ICD-10-CM | POA: Diagnosis not present

## 2023-01-22 DIAGNOSIS — M810 Age-related osteoporosis without current pathological fracture: Secondary | ICD-10-CM | POA: Diagnosis not present

## 2023-01-22 DIAGNOSIS — H353221 Exudative age-related macular degeneration, left eye, with active choroidal neovascularization: Secondary | ICD-10-CM | POA: Diagnosis not present

## 2023-01-22 DIAGNOSIS — G459 Transient cerebral ischemic attack, unspecified: Secondary | ICD-10-CM | POA: Diagnosis not present

## 2023-01-22 DIAGNOSIS — Z9181 History of falling: Secondary | ICD-10-CM | POA: Diagnosis not present

## 2023-01-22 DIAGNOSIS — R635 Abnormal weight gain: Secondary | ICD-10-CM | POA: Diagnosis not present

## 2023-01-22 DIAGNOSIS — I10 Essential (primary) hypertension: Secondary | ICD-10-CM | POA: Diagnosis not present

## 2023-01-22 DIAGNOSIS — M6281 Muscle weakness (generalized): Secondary | ICD-10-CM | POA: Diagnosis not present

## 2023-01-22 DIAGNOSIS — N390 Urinary tract infection, site not specified: Secondary | ICD-10-CM

## 2023-01-22 NOTE — Progress Notes (Signed)
Location:   [facility] Room Number: 34-A [name]:  ALF [phone]) Provider:  Hazle Nordmann, NP  PCP: Mahlon Gammon, MD  Patient Care Team: Mahlon Gammon, MD as PCP - General (Internal Medicine) Runell Gess, MD as PCP - Cardiology (Cardiology) Carman Ching, MD (Inactive) as Consulting Physician (Gastroenterology) Esaw Dace, MD as Attending Physician (Urology) Runell Gess, MD as Consulting Physician (Cardiology) Drema Halon, MD (Inactive) as Consulting Physician (Otolaryngology) Elizebeth Koller, DDS (Dentistry) Mckinley Jewel, MD as Consulting Physician (Ophthalmology) Bradly Bienenstock, MD as Consulting Physician (Orthopedic Surgery) Gaynelle Adu, MD as Consulting Physician (General Surgery)  Extended Emergency Contact Information Primary Emergency Contact: Sheran Luz Address: [address]          [place], [place] [address] Darden Amber of [place] Home Phone: [phone] Work Phone: [phone] Mobile Phone: [phone] Relation: Daughter Secondary Emergency Contact: Lia Hopping Address: [address]          [place], [place] [address] Darden Amber of [place] Home Phone: [phone] Mobile Phone: [phone] Relation: Son  Code Status:  DNR Goals of care: Advanced Directive information    01/22/2023   10:18 AM  Advanced Directives  Does Patient Have a Medical Advance Directive? Yes  Type of Estate agent of [place];Living will;Out of facility DNR (pink MOST or yellow form)  Does patient want to make changes to medical advance directive? No - Patient declined  Copy of Healthcare Power of Attorney in Chart? Yes - validated most recent copy scanned in chart (See row information)     Chief Complaint  Patient presents with   Medical Management of Chronic Issues    Routine Visit.    Immunizations    Discuss the need for DTAP vaccine, Influenza vaccine, and Covid Booster.    HPI:   Pt is a 89 y.o. female seen today for medical management of chronic diseases.    She currently resides on the assisted living unit at [facility]. PMH: brain aneurysm, venous insufficiency, HTN, TIA, GERD, hypothyroidism, OAB, senile osteoporosis, macular degeneration, OA, and unstable gait.   Weight gain- TSH 3.20 10/31/2022, upset clothes feel tighter, denies increased eating, sedentary most of day, not on levothyroxine HTN- BUN/creat 29/0.8 10/31/2022, remains on metoprolol H/o TIA- not on statin, remains on Plavix Recurrent UTI- remains on Keflex and cranberry supplement Chronic venous insufficiency- sedentary lifestyle, uses compression stockings daily Unstable gait- moved to AL earlier this year, no recent falls, ambulates with rolator/wheelchair Senile osteoporosis- DEXA 2018, t score -2.3, remains on vitamin D  09/05 care plan meeting to discuss care needs. Nursing was concerned she was needing more assistance and possibly needing SNF. She is in AL trial x 2 weeks. Nursing reports she is doing most ADLs on her own.   Recent weights:  09/01- 158.6 lbs  08/01- 155.6 lbs  05/09- 144.8 lbs  Recent blood pressures:  09/04- 138/80  08/28- 135/80  08/21- 120/76    Past Medical History:  Diagnosis Date   Cervicalgia    Chest pain at rest 01/07/2012   Displacement of cervical intervertebral disc without myelopathy    Diverticulosis of colon (without mention of hemorrhage)    Female stress incontinence    GERD (gastroesophageal reflux disease)    no peds occasional pepcid   Inguinal hernia without mention of obstruction or gangrene, unilateral or unspecified, (not specified as recurrent)    Internal hemorrhoids without mention of complication    Lumbago  Macular degeneration (senile) of retina, unspecified    Osteoarthrosis, unspecified whether generalized or localized, unspecified site    Other and unspecified hyperlipidemia    Pain in joint, hand    Palpitations     Reflux esophagitis    Scoliosis (and kyphoscoliosis), idiopathic    Senile osteoporosis    Skin disorder    Thyroid disease    TIA (transient ischemic attack)    Unruptured popliteal cyst 06/24/2014   Right knee    Unspecified essential hypertension    Unspecified glaucoma(365.9)    Unspecified hypothyroidism    Unspecified vitamin D deficiency    Past Surgical History:  Procedure Laterality Date   ABDOMINAL HYSTERECTOMY  1975   Dr Dewaine Conger   APPENDECTOMY  1988   cardiolite myocardial perfusion study     DOPPLER ECHOCARDIOGRAPHY     EYE SURGERY Bilateral 2009   cataract removed right eye, Dr Emmit Pomfret   FOOT SURGERY  august 2013   Hewitt, MD   INGUINAL HERNIA REPAIR Bilateral    w/mesh   INGUINAL HERNIA REPAIR Left 08/03/2017   Procedure: LAPAROSCOPIC LEFT INGUINAL HERNIA REPAIR WITH MESH;  Surgeon: Gaynelle Adu, MD;  Location: [facility] OR;  Service: General;  Laterality: Left;   INGUINAL HERNIA REPAIR Right 08/03/2017   Procedure: HERNIA REPAIR RIGHT INGUINAL ADULT WITH MESH;  Surgeon: Gaynelle Adu, MD;  Location: [facility] OR;  Service: General;  Laterality: Right;   INGUINAL HERNIA REPAIR     Dr. Andrey Campanile 04-24-18   INGUINAL HERNIA REPAIR Right 04/24/2018   Procedure: DIAGNOSTIC LAPAROSCOPIC, OPENREPAIR OF RECURRENT RIGHT INGUINAL HERNIA WITH MESH ERAS PATHWAY;  Surgeon: Gaynelle Adu, MD;  Location: [facility] ORS;  Service: General;  Laterality: Right;   INSERTION OF MESH Bilateral 08/03/2017   Procedure: INSERTION OF MESH;  Surgeon: Gaynelle Adu, MD;  Location: [facility] OR;  Service: General;  Laterality: Bilateral;   NM MYOVIEW LTD     negative   TONSILLECTOMY  1937   TOOTH EXTRACTION  09/16/13   Dr Randel Books    Allergies  Allergen Reactions   Trimethoprim Other (See Comments)    Headaches/ ear pressure    Erythromycin Other (See Comments)    UNSPECIFIED REACTION    Macrodantin [Nitrofurantoin Macrocrystal] Other (See Comments)    UNSPECIFIED REACTION    Other     Honeydew Melon   Penicillins Other  (See Comments)    UNSPECIFIED REACTION  Has patient had a PCN reaction causing immediate rash, facial/tongue/throat swelling, SOB or lightheadedness with hypotension: Unknown Has patient had a PCN reaction causing severe rash involving mucus membranes or skin necrosis: Unknown Has patient had a PCN reaction that required hospitalization: No Has patient had a PCN reaction occurring within the last 10 years: No If all of the above answers are "NO", then may proceed with Cephalosporin use.   Sulfa Antibiotics Other (See Comments)    UNSPECIFIED REACTION    Latex Itching    Allergies as of 01/22/2023       Reactions   Trimethoprim Other (See Comments)   Headaches/ ear pressure    Erythromycin Other (See Comments)   UNSPECIFIED REACTION    Macrodantin [nitrofurantoin Macrocrystal] Other (See Comments)   UNSPECIFIED REACTION    Other    Honeydew Melon   Penicillins Other (See Comments)   UNSPECIFIED REACTION  Has patient had a PCN reaction causing immediate rash, facial/tongue/throat swelling, SOB or lightheadedness with hypotension: Unknown Has patient had a PCN reaction causing severe rash involving mucus membranes or skin necrosis:  Unknown Has patient had a PCN reaction that required hospitalization: No Has patient had a PCN reaction occurring within the last 10 years: No If all of the above answers are "NO", then may proceed with Cephalosporin use.   Sulfa Antibiotics Other (See Comments)   UNSPECIFIED REACTION    Latex Itching        Medication List        Accurate as of January 22, 2023 10:18 AM. If you have any questions, ask your nurse or doctor.          STOP taking these medications    famotidine 10 MG chewable tablet Commonly known as: PEPCID AC Stopped by: Jimel Myler E Kimbly Eanes   Tubersol 5 UNIT/0.1ML injection Generic drug: tuberculin Stopped by: Brien Lowe E Dewitt Judice       TAKE these medications    acetaminophen 500 MG tablet Commonly known as: TYLENOL Take 500 mg  by mouth 2 (two) times daily as needed for mild pain or headache.   ascorbic acid 500 MG tablet Commonly known as: VITAMIN C Take 500 mg by mouth daily with supper.   AZO CRANBERRY PO Take 1 tablet by mouth daily.   cephALEXin 250 MG capsule Commonly known as: KEFLEX Take 250 mg by mouth daily.   clopidogrel 75 MG tablet Commonly known as: PLAVIX TAKE ONE TABLET BY MOUTH DAILY   metoprolol succinate 25 MG 24 hr tablet Commonly known as: TOPROL-XL Take one tablet by mouth once daily to regulate heart and control blood pressure.   PRESERVISION AREDS 2 PO Take 1 capsule by mouth 2 (two) times daily.   Vitamin D3 50 MCG (2000 UT) Tabs Take by mouth.        Review of Systems  Constitutional:  Negative for activity change and appetite change.  HENT:  Negative for sore throat and trouble swallowing.   Eyes:  Positive for visual disturbance.  Respiratory:  Negative for cough, shortness of breath and wheezing.   Cardiovascular:  Positive for leg swelling. Negative for chest pain.  Gastrointestinal:  Positive for constipation. Negative for abdominal distention and abdominal pain.  Genitourinary:  Negative for dysuria, frequency, hematuria and vaginal bleeding.  Musculoskeletal:  Positive for gait problem.  Skin:  Negative for wound.  Neurological:  Positive for weakness. Negative for dizziness and headaches.  Psychiatric/Behavioral:  Negative for confusion and dysphoric mood. The patient is not nervous/anxious.     Immunization History  Administered Date(s) Administered   Fluad Quad(high Dose 65+) 02/04/2019, 03/01/2022   Influenza Whole 02/24/2011, 02/12/2012   Influenza, High Dose Seasonal PF 02/01/2017, 02/25/2018, 02/11/2020   Influenza,inj,Quad PF,6+ Mos 02/20/2013, 02/10/2014, 02/19/2015, 02/18/2016   Influenza-Unspecified 02/16/2021   Moderna Sars-Covid-2 Vaccination 05/19/2019, 06/16/2019, 03/29/2020, 09/27/2020   Pneumococcal Conjugate-13 06/24/2014   Pneumococcal  Polysaccharide-23 04/12/1998   Td 02/21/1996, 07/23/2003   Tdap 03/20/2010   Zoster Recombinant(Shingrix) 09/19/2016, 12/02/2016   Zoster, Live 09/15/2005   Pertinent  Health Maintenance Due  Topic Date Due   INFLUENZA VACCINE  12/14/2022   DEXA SCAN  Completed      03/08/2022    2:52 PM 07/31/2022   11:27 AM 08/23/2022    1:05 PM 09/06/2022    2:27 PM 12/27/2022    2:15 PM  Fall Risk  Falls in the past year? 0 1 1 1  0  Was there an injury with Fall? 0 1 0 1 0  Fall Risk Category Calculator 0 2 1 2  0  Fall Risk Category (Retired) Low      (  RETIRED) Patient Fall Risk Level Low fall risk      Patient at Risk for Falls Due to No Fall Risks Impaired balance/gait;Impaired mobility History of fall(s) History of fall(s);Impaired balance/gait;Impaired mobility No Fall Risks  Fall risk Follow up  Falls evaluation completed;Education provided;Falls prevention discussed Falls evaluation completed Falls evaluation completed;Education provided;Falls prevention discussed Falls evaluation completed;Education provided;Falls prevention discussed   Functional Status Survey:    Vitals:   01/22/23 1011  BP: 138/80  Pulse: 60  Resp: 20  Temp: 97.7 F (36.5 C)  SpO2: 94%  Weight: 155 lb 9.6 oz (70.6 kg)  Height: 5\' 4"  (1.626 m)   Body mass index is 26.71 kg/m. Physical Exam Vitals reviewed.  Constitutional:      General: She is not in acute distress. HENT:     Head: Normocephalic.     Right Ear: There is no impacted cerumen.     Left Ear: There is no impacted cerumen.     Nose: Nose normal.     Mouth/Throat:     Mouth: Mucous membranes are moist.  Eyes:     General:        Right eye: No discharge.        Left eye: No discharge.  Cardiovascular:     Rate and Rhythm: Normal rate and regular rhythm.     Pulses: Normal pulses.     Heart sounds: Normal heart sounds.  Pulmonary:     Effort: Pulmonary effort is normal. No respiratory distress.     Breath sounds: Normal breath sounds.  No wheezing.  Abdominal:     General: Bowel sounds are normal.     Palpations: Abdomen is soft.  Musculoskeletal:     Cervical back: Neck supple.     Right lower leg: Edema present.     Left lower leg: Edema present.     Comments: Non pitting, compression stockings on  Skin:    General: Skin is warm.     Capillary Refill: Capillary refill takes less than 2 seconds.  Neurological:     General: No focal deficit present.     Mental Status: She is alert and oriented to person, place, and time.     Motor: Weakness present.     Gait: Gait abnormal.     Comments: Rolator/wheelchair  Psychiatric:        Mood and Affect: Mood normal.     Labs reviewed: Recent Labs    03/20/22 0822 07/26/22 1715 10/31/22 0000  NA 141 139 141  K 4.1 4.2 3.7  CL 105 102 104  CO2 29 27 24*  GLUCOSE 87 100*  --   BUN 25 18 29*  CREATININE 0.63 0.74 0.8  CALCIUM 9.5 9.8 9.5   Recent Labs    03/20/22 0822 10/31/22 0000  AST 17 21  ALT 16 18  ALKPHOS  --  71  BILITOT 0.6  --   PROT 6.3  --   ALBUMIN  --  4.1   Recent Labs    07/26/22 1715 10/31/22 0000  WBC 7.0 10.3  NEUTROABS 4.1  --   HGB 13.6 13.8  HCT 41.0 42  MCV 92.6  --   PLT 207 234   Lab Results  Component Value Date   TSH 3.20 10/31/2022   Lab Results  Component Value Date   HGBA1C 5.3 05/02/2013   Lab Results  Component Value Date   CHOL 188 07/10/2019   HDL 75 07/10/2019   LDLCALC 94 07/10/2019  TRIG 96 07/10/2019   CHOLHDL 2.5 07/10/2019    Significant Diagnostic Results in last 30 days:  No results found.  Assessment/Plan 1. Weight gain - appears weight has increased about 9 lbs in past few months - TSH 3.20 06/18 - suspect improved eating since move to AL - cont monthly weights  2. Essential hypertension - controlled with metoprolol  3. TIA (transient ischemic attack) - not on statin - cont Plavix  4. Recurrent UTI - cont Keflex  5. Chronic venous insufficiency - non pitting edema -  cont compression hose  6. Unstable gait - no recent falls - doing well with AL trial  7. Senile osteoporosis - no recent DEXA - cont vitamin D supplement    Family/ staff Communication: plan discussed with patient and nurse  Labs/tests ordered:  none

## 2023-01-23 DIAGNOSIS — R278 Other lack of coordination: Secondary | ICD-10-CM | POA: Diagnosis not present

## 2023-01-23 DIAGNOSIS — R2681 Unsteadiness on feet: Secondary | ICD-10-CM | POA: Diagnosis not present

## 2023-01-23 DIAGNOSIS — Z9181 History of falling: Secondary | ICD-10-CM | POA: Diagnosis not present

## 2023-01-23 DIAGNOSIS — M6281 Muscle weakness (generalized): Secondary | ICD-10-CM | POA: Diagnosis not present

## 2023-01-25 DIAGNOSIS — R2681 Unsteadiness on feet: Secondary | ICD-10-CM | POA: Diagnosis not present

## 2023-01-25 DIAGNOSIS — M6281 Muscle weakness (generalized): Secondary | ICD-10-CM | POA: Diagnosis not present

## 2023-01-25 DIAGNOSIS — R278 Other lack of coordination: Secondary | ICD-10-CM | POA: Diagnosis not present

## 2023-01-25 DIAGNOSIS — Z9181 History of falling: Secondary | ICD-10-CM | POA: Diagnosis not present

## 2023-01-29 DIAGNOSIS — M6281 Muscle weakness (generalized): Secondary | ICD-10-CM | POA: Diagnosis not present

## 2023-01-29 DIAGNOSIS — R2681 Unsteadiness on feet: Secondary | ICD-10-CM | POA: Diagnosis not present

## 2023-01-29 DIAGNOSIS — R278 Other lack of coordination: Secondary | ICD-10-CM | POA: Diagnosis not present

## 2023-01-29 DIAGNOSIS — Z9181 History of falling: Secondary | ICD-10-CM | POA: Diagnosis not present

## 2023-01-30 DIAGNOSIS — Z9181 History of falling: Secondary | ICD-10-CM | POA: Diagnosis not present

## 2023-01-30 DIAGNOSIS — R2681 Unsteadiness on feet: Secondary | ICD-10-CM | POA: Diagnosis not present

## 2023-01-30 DIAGNOSIS — R278 Other lack of coordination: Secondary | ICD-10-CM | POA: Diagnosis not present

## 2023-01-30 DIAGNOSIS — M6281 Muscle weakness (generalized): Secondary | ICD-10-CM | POA: Diagnosis not present

## 2023-01-31 DIAGNOSIS — R2681 Unsteadiness on feet: Secondary | ICD-10-CM | POA: Diagnosis not present

## 2023-01-31 DIAGNOSIS — R278 Other lack of coordination: Secondary | ICD-10-CM | POA: Diagnosis not present

## 2023-01-31 DIAGNOSIS — M6281 Muscle weakness (generalized): Secondary | ICD-10-CM | POA: Diagnosis not present

## 2023-01-31 DIAGNOSIS — Z9181 History of falling: Secondary | ICD-10-CM | POA: Diagnosis not present

## 2023-02-01 DIAGNOSIS — Z9181 History of falling: Secondary | ICD-10-CM | POA: Diagnosis not present

## 2023-02-01 DIAGNOSIS — R2681 Unsteadiness on feet: Secondary | ICD-10-CM | POA: Diagnosis not present

## 2023-02-01 DIAGNOSIS — R278 Other lack of coordination: Secondary | ICD-10-CM | POA: Diagnosis not present

## 2023-02-01 DIAGNOSIS — M6281 Muscle weakness (generalized): Secondary | ICD-10-CM | POA: Diagnosis not present

## 2023-02-05 DIAGNOSIS — R2681 Unsteadiness on feet: Secondary | ICD-10-CM | POA: Diagnosis not present

## 2023-02-05 DIAGNOSIS — R278 Other lack of coordination: Secondary | ICD-10-CM | POA: Diagnosis not present

## 2023-02-05 DIAGNOSIS — Z9181 History of falling: Secondary | ICD-10-CM | POA: Diagnosis not present

## 2023-02-05 DIAGNOSIS — M6281 Muscle weakness (generalized): Secondary | ICD-10-CM | POA: Diagnosis not present

## 2023-02-06 DIAGNOSIS — R278 Other lack of coordination: Secondary | ICD-10-CM | POA: Diagnosis not present

## 2023-02-06 DIAGNOSIS — R2681 Unsteadiness on feet: Secondary | ICD-10-CM | POA: Diagnosis not present

## 2023-02-06 DIAGNOSIS — Z9181 History of falling: Secondary | ICD-10-CM | POA: Diagnosis not present

## 2023-02-06 DIAGNOSIS — M6281 Muscle weakness (generalized): Secondary | ICD-10-CM | POA: Diagnosis not present

## 2023-02-08 DIAGNOSIS — R2681 Unsteadiness on feet: Secondary | ICD-10-CM | POA: Diagnosis not present

## 2023-02-08 DIAGNOSIS — R278 Other lack of coordination: Secondary | ICD-10-CM | POA: Diagnosis not present

## 2023-02-08 DIAGNOSIS — Z9181 History of falling: Secondary | ICD-10-CM | POA: Diagnosis not present

## 2023-02-08 DIAGNOSIS — M6281 Muscle weakness (generalized): Secondary | ICD-10-CM | POA: Diagnosis not present

## 2023-02-09 DIAGNOSIS — M6281 Muscle weakness (generalized): Secondary | ICD-10-CM | POA: Diagnosis not present

## 2023-02-09 DIAGNOSIS — R278 Other lack of coordination: Secondary | ICD-10-CM | POA: Diagnosis not present

## 2023-02-09 DIAGNOSIS — M2041 Other hammer toe(s) (acquired), right foot: Secondary | ICD-10-CM | POA: Diagnosis not present

## 2023-02-09 DIAGNOSIS — L602 Onychogryphosis: Secondary | ICD-10-CM | POA: Diagnosis not present

## 2023-02-09 DIAGNOSIS — R2681 Unsteadiness on feet: Secondary | ICD-10-CM | POA: Diagnosis not present

## 2023-02-09 DIAGNOSIS — Z9181 History of falling: Secondary | ICD-10-CM | POA: Diagnosis not present

## 2023-02-09 DIAGNOSIS — M2042 Other hammer toe(s) (acquired), left foot: Secondary | ICD-10-CM | POA: Diagnosis not present

## 2023-02-12 DIAGNOSIS — R2681 Unsteadiness on feet: Secondary | ICD-10-CM | POA: Diagnosis not present

## 2023-02-12 DIAGNOSIS — R278 Other lack of coordination: Secondary | ICD-10-CM | POA: Diagnosis not present

## 2023-02-12 DIAGNOSIS — M6281 Muscle weakness (generalized): Secondary | ICD-10-CM | POA: Diagnosis not present

## 2023-02-12 DIAGNOSIS — Z9181 History of falling: Secondary | ICD-10-CM | POA: Diagnosis not present

## 2023-02-13 DIAGNOSIS — R278 Other lack of coordination: Secondary | ICD-10-CM | POA: Diagnosis not present

## 2023-02-13 DIAGNOSIS — R2681 Unsteadiness on feet: Secondary | ICD-10-CM | POA: Diagnosis not present

## 2023-02-13 DIAGNOSIS — Z9181 History of falling: Secondary | ICD-10-CM | POA: Diagnosis not present

## 2023-02-13 DIAGNOSIS — M6281 Muscle weakness (generalized): Secondary | ICD-10-CM | POA: Diagnosis not present

## 2023-02-15 DIAGNOSIS — R2681 Unsteadiness on feet: Secondary | ICD-10-CM | POA: Diagnosis not present

## 2023-02-15 DIAGNOSIS — M6281 Muscle weakness (generalized): Secondary | ICD-10-CM | POA: Diagnosis not present

## 2023-02-15 DIAGNOSIS — Z9181 History of falling: Secondary | ICD-10-CM | POA: Diagnosis not present

## 2023-02-15 DIAGNOSIS — R278 Other lack of coordination: Secondary | ICD-10-CM | POA: Diagnosis not present

## 2023-02-19 DIAGNOSIS — Z9181 History of falling: Secondary | ICD-10-CM | POA: Diagnosis not present

## 2023-02-19 DIAGNOSIS — M6281 Muscle weakness (generalized): Secondary | ICD-10-CM | POA: Diagnosis not present

## 2023-02-19 DIAGNOSIS — R2681 Unsteadiness on feet: Secondary | ICD-10-CM | POA: Diagnosis not present

## 2023-02-19 DIAGNOSIS — R278 Other lack of coordination: Secondary | ICD-10-CM | POA: Diagnosis not present

## 2023-02-20 DIAGNOSIS — Z9181 History of falling: Secondary | ICD-10-CM | POA: Diagnosis not present

## 2023-02-20 DIAGNOSIS — M6281 Muscle weakness (generalized): Secondary | ICD-10-CM | POA: Diagnosis not present

## 2023-02-20 DIAGNOSIS — R278 Other lack of coordination: Secondary | ICD-10-CM | POA: Diagnosis not present

## 2023-02-20 DIAGNOSIS — R2681 Unsteadiness on feet: Secondary | ICD-10-CM | POA: Diagnosis not present

## 2023-02-22 DIAGNOSIS — R278 Other lack of coordination: Secondary | ICD-10-CM | POA: Diagnosis not present

## 2023-02-22 DIAGNOSIS — Z9181 History of falling: Secondary | ICD-10-CM | POA: Diagnosis not present

## 2023-02-22 DIAGNOSIS — M6281 Muscle weakness (generalized): Secondary | ICD-10-CM | POA: Diagnosis not present

## 2023-02-22 DIAGNOSIS — R2681 Unsteadiness on feet: Secondary | ICD-10-CM | POA: Diagnosis not present

## 2023-02-23 DIAGNOSIS — R2681 Unsteadiness on feet: Secondary | ICD-10-CM | POA: Diagnosis not present

## 2023-02-23 DIAGNOSIS — R278 Other lack of coordination: Secondary | ICD-10-CM | POA: Diagnosis not present

## 2023-02-23 DIAGNOSIS — M6281 Muscle weakness (generalized): Secondary | ICD-10-CM | POA: Diagnosis not present

## 2023-02-23 DIAGNOSIS — Z9181 History of falling: Secondary | ICD-10-CM | POA: Diagnosis not present

## 2023-02-26 DIAGNOSIS — H353221 Exudative age-related macular degeneration, left eye, with active choroidal neovascularization: Secondary | ICD-10-CM | POA: Diagnosis not present

## 2023-02-27 DIAGNOSIS — Z9181 History of falling: Secondary | ICD-10-CM | POA: Diagnosis not present

## 2023-02-27 DIAGNOSIS — M6281 Muscle weakness (generalized): Secondary | ICD-10-CM | POA: Diagnosis not present

## 2023-02-27 DIAGNOSIS — R2681 Unsteadiness on feet: Secondary | ICD-10-CM | POA: Diagnosis not present

## 2023-02-27 DIAGNOSIS — R278 Other lack of coordination: Secondary | ICD-10-CM | POA: Diagnosis not present

## 2023-02-28 DIAGNOSIS — R2681 Unsteadiness on feet: Secondary | ICD-10-CM | POA: Diagnosis not present

## 2023-02-28 DIAGNOSIS — Z9181 History of falling: Secondary | ICD-10-CM | POA: Diagnosis not present

## 2023-02-28 DIAGNOSIS — R278 Other lack of coordination: Secondary | ICD-10-CM | POA: Diagnosis not present

## 2023-02-28 DIAGNOSIS — M6281 Muscle weakness (generalized): Secondary | ICD-10-CM | POA: Diagnosis not present

## 2023-03-01 ENCOUNTER — Non-Acute Institutional Stay: Payer: Medicare Other | Admitting: Internal Medicine

## 2023-03-01 ENCOUNTER — Encounter: Payer: Self-pay | Admitting: Internal Medicine

## 2023-03-01 DIAGNOSIS — Z9181 History of falling: Secondary | ICD-10-CM | POA: Diagnosis not present

## 2023-03-01 DIAGNOSIS — I10 Essential (primary) hypertension: Secondary | ICD-10-CM

## 2023-03-01 DIAGNOSIS — G459 Transient cerebral ischemic attack, unspecified: Secondary | ICD-10-CM

## 2023-03-01 DIAGNOSIS — N390 Urinary tract infection, site not specified: Secondary | ICD-10-CM | POA: Diagnosis not present

## 2023-03-01 DIAGNOSIS — M6281 Muscle weakness (generalized): Secondary | ICD-10-CM | POA: Diagnosis not present

## 2023-03-01 DIAGNOSIS — R052 Subacute cough: Secondary | ICD-10-CM | POA: Diagnosis not present

## 2023-03-01 DIAGNOSIS — R2681 Unsteadiness on feet: Secondary | ICD-10-CM | POA: Diagnosis not present

## 2023-03-01 DIAGNOSIS — R278 Other lack of coordination: Secondary | ICD-10-CM | POA: Diagnosis not present

## 2023-03-01 NOTE — Progress Notes (Signed)
Location: [facility] Room Number: 34A [name]:  ALF (13)  Provider:   Code Status: Full Code Goals of Care:     03/01/2023    3:20 PM  Advanced Directives  Does Patient Have a Medical Advance Directive? Yes  Type of Estate agent of [place];Living will;Out of facility DNR (pink MOST or yellow form)  Does patient want to make changes to medical advance directive? No - Patient declined  Copy of Healthcare Power of Attorney in Chart? Yes - validated most recent copy scanned in chart (See row information)     Chief Complaint  Patient presents with   Acute Visit    Patient is being seen for a cough     HPI: Patient is a 89 y.o. female seen today for an acute visit for Cough Decline and transfer to SNF  Lives in AL in [facility]  Patient has a history of hypertension, hyperlipidemia, macular degeneration, mild cognitive impairment, recurrent UTI, GERD, H/o TIA   Patient has been recently Physically declining and needing more help with her ADLS Family was concerned about her cough which they noticed yesterday She says her cough is better today Denies any SOB ,Chest pain Or fever Per nurses she is doing well Cognitively but just getting weak over few months needing more help with her ADLS They are planning to transfer her to SNF level of care  Past Medical History:  Diagnosis Date   Cervicalgia    Chest pain at rest 01/07/2012   Displacement of cervical intervertebral disc without myelopathy    Diverticulosis of colon (without mention of hemorrhage)    Female stress incontinence    GERD (gastroesophageal reflux disease)    no peds occasional pepcid   Inguinal hernia without mention of obstruction or gangrene, unilateral or unspecified, (not specified as recurrent)    Internal hemorrhoids without mention of complication    Lumbago    Macular degeneration (senile) of retina, unspecified    Osteoarthrosis, unspecified whether  generalized or localized, unspecified site    Other and unspecified hyperlipidemia    Pain in joint, hand    Palpitations    Reflux esophagitis    Scoliosis (and kyphoscoliosis), idiopathic    Senile osteoporosis    Skin disorder    Thyroid disease    TIA (transient ischemic attack)    Unruptured popliteal cyst 06/24/2014   Right knee    Unspecified essential hypertension    Unspecified glaucoma(365.9)    Unspecified hypothyroidism    Unspecified vitamin D deficiency     Past Surgical History:  Procedure Laterality Date   ABDOMINAL HYSTERECTOMY  1975   Dr Dewaine Conger   APPENDECTOMY  1988   cardiolite myocardial perfusion study     DOPPLER ECHOCARDIOGRAPHY     EYE SURGERY Bilateral 2009   cataract removed right eye, Dr Emmit Pomfret   FOOT SURGERY  august 2013   Hewitt, MD   INGUINAL HERNIA REPAIR Bilateral    w/mesh   INGUINAL HERNIA REPAIR Left 08/03/2017   Procedure: LAPAROSCOPIC LEFT INGUINAL HERNIA REPAIR WITH MESH;  Surgeon: Gaynelle Adu, MD;  Location: [facility] OR;  Service: General;  Laterality: Left;   INGUINAL HERNIA REPAIR Right 08/03/2017   Procedure: HERNIA REPAIR RIGHT INGUINAL ADULT WITH MESH;  Surgeon: Gaynelle Adu, MD;  Location: [facility] OR;  Service: General;  Laterality: Right;   INGUINAL HERNIA REPAIR     Dr. Andrey Campanile 04-24-18   INGUINAL HERNIA REPAIR Right 04/24/2018   Procedure:  DIAGNOSTIC LAPAROSCOPIC, OPENREPAIR OF RECURRENT RIGHT INGUINAL HERNIA WITH MESH ERAS PATHWAY;  Surgeon: Gaynelle Adu, MD;  Location: [facility] ORS;  Service: General;  Laterality: Right;   INSERTION OF MESH Bilateral 08/03/2017   Procedure: INSERTION OF MESH;  Surgeon: Gaynelle Adu, MD;  Location: [facility] OR;  Service: General;  Laterality: Bilateral;   NM MYOVIEW LTD     negative   TONSILLECTOMY  1937   TOOTH EXTRACTION  09/16/13   Dr Randel Books    Allergies  Allergen Reactions   Trimethoprim Other (See Comments)    Headaches/ ear pressure    Erythromycin Other (See Comments)    UNSPECIFIED REACTION     Macrodantin [Nitrofurantoin Macrocrystal] Other (See Comments)    UNSPECIFIED REACTION    Other     Honeydew Melon   Penicillins Other (See Comments)    UNSPECIFIED REACTION  Has patient had a PCN reaction causing immediate rash, facial/tongue/throat swelling, SOB or lightheadedness with hypotension: Unknown Has patient had a PCN reaction causing severe rash involving mucus membranes or skin necrosis: Unknown Has patient had a PCN reaction that required hospitalization: No Has patient had a PCN reaction occurring within the last 10 years: No If all of the above answers are "NO", then may proceed with Cephalosporin use.   Sulfa Antibiotics Other (See Comments)    UNSPECIFIED REACTION    Latex Itching    Outpatient Encounter Medications as of 03/01/2023  Medication Sig   acetaminophen (TYLENOL) 500 MG tablet Take 500 mg by mouth 2 (two) times daily as needed for mild pain or headache.    cephALEXin (KEFLEX) 250 MG capsule Take 250 mg by mouth daily.   Cholecalciferol (VITAMIN D3) 50 MCG (2000 UT) TABS Take by mouth.   clopidogrel (PLAVIX) 75 MG tablet TAKE ONE TABLET BY MOUTH DAILY   Cranberry-Vitamin C-Probiotic (AZO CRANBERRY PO) Take 1 tablet by mouth daily.   metoprolol succinate (TOPROL-XL) 25 MG 24 hr tablet Take one tablet by mouth once daily to regulate heart and control blood pressure.   Multiple Vitamins-Minerals (PRESERVISION AREDS 2 PO) Take 1 capsule by mouth 2 (two) times daily.    vitamin C (ASCORBIC ACID) 500 MG tablet Take 500 mg by mouth daily with supper.    No facility-administered encounter medications on file as of 03/01/2023.    Review of Systems:  Review of Systems  Constitutional:  Positive for activity change. Negative for appetite change.  HENT: Negative.    Respiratory:  Negative for cough and shortness of breath.   Cardiovascular:  Negative for leg swelling.  Gastrointestinal:  Negative for constipation.  Genitourinary: Negative.   Musculoskeletal:   Positive for gait problem. Negative for arthralgias and myalgias.  Skin: Negative.   Neurological:  Negative for dizziness and weakness.  Psychiatric/Behavioral:  Positive for confusion. Negative for dysphoric mood and sleep disturbance.     Health Maintenance  Topic Date Due   DTaP/Tdap/Td (4 - Td or Tdap) 03/20/2020   INFLUENZA VACCINE  12/14/2022   COVID-19 Vaccine (5 - 2023-24 season) 01/14/2023   Medicare Annual Wellness (AWV)  09/06/2023   Pneumonia Vaccine 52+ Years old  Completed   DEXA SCAN  Completed   Zoster Vaccines- Shingrix  Completed   HPV VACCINES  Aged Out    Physical Exam: Vitals:   03/01/23 1518  BP: 125/71  Pulse: 77  Resp: (!) 24  Temp: 97.9 F (36.6 C)  TempSrc: Temporal  SpO2: 96%  Weight: 155 lb 9.6 oz (70.6 kg)  Height: 5'  4" (1.626 m)   Body mass index is 26.71 kg/m. Physical Exam Vitals reviewed.  Constitutional:      Appearance: Normal appearance.  HENT:     Head: Normocephalic.     Nose: Nose normal.     Mouth/Throat:     Mouth: Mucous membranes are moist.     Pharynx: Oropharynx is clear.  Eyes:     Pupils: Pupils are equal, round, and reactive to light.  Cardiovascular:     Rate and Rhythm: Normal rate and regular rhythm.     Pulses: Normal pulses.     Heart sounds: Normal heart sounds. No murmur heard. Pulmonary:     Effort: Pulmonary effort is normal. No respiratory distress.     Breath sounds: Normal breath sounds. No wheezing or rales.  Abdominal:     General: Abdomen is flat. Bowel sounds are normal.     Palpations: Abdomen is soft.  Musculoskeletal:        General: No swelling.     Cervical back: Neck supple.  Skin:    General: Skin is warm.  Neurological:     General: No focal deficit present.     Mental Status: She is alert and oriented to person, place, and time.  Psychiatric:        Mood and Affect: Mood normal.        Thought Content: Thought content normal.     Labs reviewed: Basic Metabolic  Panel: Recent Labs    03/20/22 0822 07/26/22 1715 10/31/22 0000  NA 141 139 141  K 4.1 4.2 3.7  CL 105 102 104  CO2 29 27 24*  GLUCOSE 87 100*  --   BUN 25 18 29*  CREATININE 0.63 0.74 0.8  CALCIUM 9.5 9.8 9.5  TSH  --   --  3.20   Liver Function Tests: Recent Labs    03/20/22 0822 10/31/22 0000  AST 17 21  ALT 16 18  ALKPHOS  --  71  BILITOT 0.6  --   PROT 6.3  --   ALBUMIN  --  4.1   No results for input(s): "LIPASE", "AMYLASE" in the last 8760 hours. No results for input(s): "AMMONIA" in the last 8760 hours. CBC: Recent Labs    07/26/22 1715 10/31/22 0000  WBC 7.0 10.3  NEUTROABS 4.1  --   HGB 13.6 13.8  HCT 41.0 42  MCV 92.6  --   PLT 207 234   Lipid Panel: No results for input(s): "CHOL", "HDL", "LDLCALC", "TRIG", "CHOLHDL", "LDLDIRECT" in the last 8760 hours. Lab Results  Component Value Date   HGBA1C 5.3 05/02/2013    Procedures since last visit: No results found.  Assessment/Plan 1. Subacute cough Seems to have resolved right now Her lungs are clear Will Monitor for now  2. Essential hypertension On Metoprolol  3. TIA (transient ischemic attack) On Plavix  4. Recurrent UTI On Keflex  5. Unstable gait/Weakness Per nurses this has been happening over past few months and now she is needing help with her ADLS More Also has incontinents episodes Plan to transfer to SNF Therapy   Labs/tests ordered:  CBC,CMP  Next appt:  Visit date not found

## 2023-03-02 ENCOUNTER — Encounter: Payer: Self-pay | Admitting: Internal Medicine

## 2023-03-05 DIAGNOSIS — R278 Other lack of coordination: Secondary | ICD-10-CM | POA: Diagnosis not present

## 2023-03-05 DIAGNOSIS — R2681 Unsteadiness on feet: Secondary | ICD-10-CM | POA: Diagnosis not present

## 2023-03-05 DIAGNOSIS — Z9181 History of falling: Secondary | ICD-10-CM | POA: Diagnosis not present

## 2023-03-05 DIAGNOSIS — I10 Essential (primary) hypertension: Secondary | ICD-10-CM | POA: Diagnosis not present

## 2023-03-05 DIAGNOSIS — E785 Hyperlipidemia, unspecified: Secondary | ICD-10-CM | POA: Diagnosis not present

## 2023-03-05 DIAGNOSIS — E039 Hypothyroidism, unspecified: Secondary | ICD-10-CM | POA: Diagnosis not present

## 2023-03-05 DIAGNOSIS — M6281 Muscle weakness (generalized): Secondary | ICD-10-CM | POA: Diagnosis not present

## 2023-03-05 LAB — HEPATIC FUNCTION PANEL
ALT: 16 U/L (ref 7–35)
AST: 14 (ref 13–35)
Alkaline Phosphatase: 71 (ref 25–125)
Bilirubin, Total: 0.4

## 2023-03-05 LAB — CBC AND DIFFERENTIAL
HCT: 37 (ref 36–46)
Hemoglobin: 12.2 (ref 12.0–16.0)
Neutrophils Absolute: 4151
Platelets: 219 10*3/uL (ref 150–400)
WBC: 7.4

## 2023-03-05 LAB — COMPREHENSIVE METABOLIC PANEL
Albumin: 3.4 — AB (ref 3.5–5.0)
Calcium: 8.8 (ref 8.7–10.7)
Globulin: 2.1

## 2023-03-05 LAB — CBC: RBC: 4.03 (ref 3.87–5.11)

## 2023-03-05 LAB — BASIC METABOLIC PANEL
BUN: 24 — AB (ref 4–21)
CO2: 27 — AB (ref 13–22)
Chloride: 105 (ref 99–108)
Creatinine: 0.6 (ref 0.5–1.1)
Glucose: 85
Potassium: 4.2 meq/L (ref 3.5–5.1)
Sodium: 140 (ref 137–147)

## 2023-03-12 DIAGNOSIS — R278 Other lack of coordination: Secondary | ICD-10-CM | POA: Diagnosis not present

## 2023-03-12 DIAGNOSIS — H353211 Exudative age-related macular degeneration, right eye, with active choroidal neovascularization: Secondary | ICD-10-CM | POA: Diagnosis not present

## 2023-03-12 DIAGNOSIS — R2681 Unsteadiness on feet: Secondary | ICD-10-CM | POA: Diagnosis not present

## 2023-03-12 DIAGNOSIS — M6281 Muscle weakness (generalized): Secondary | ICD-10-CM | POA: Diagnosis not present

## 2023-03-12 DIAGNOSIS — Z9181 History of falling: Secondary | ICD-10-CM | POA: Diagnosis not present

## 2023-03-13 DIAGNOSIS — R278 Other lack of coordination: Secondary | ICD-10-CM | POA: Diagnosis not present

## 2023-03-13 DIAGNOSIS — M6281 Muscle weakness (generalized): Secondary | ICD-10-CM | POA: Diagnosis not present

## 2023-03-13 DIAGNOSIS — R2681 Unsteadiness on feet: Secondary | ICD-10-CM | POA: Diagnosis not present

## 2023-03-13 DIAGNOSIS — Z9181 History of falling: Secondary | ICD-10-CM | POA: Diagnosis not present

## 2023-03-15 DIAGNOSIS — R2681 Unsteadiness on feet: Secondary | ICD-10-CM | POA: Diagnosis not present

## 2023-03-15 DIAGNOSIS — R278 Other lack of coordination: Secondary | ICD-10-CM | POA: Diagnosis not present

## 2023-03-15 DIAGNOSIS — M6281 Muscle weakness (generalized): Secondary | ICD-10-CM | POA: Diagnosis not present

## 2023-03-15 DIAGNOSIS — Z9181 History of falling: Secondary | ICD-10-CM | POA: Diagnosis not present

## 2023-03-19 ENCOUNTER — Encounter: Payer: Self-pay | Admitting: Orthopedic Surgery

## 2023-03-19 ENCOUNTER — Non-Acute Institutional Stay (SKILLED_NURSING_FACILITY): Payer: Self-pay | Admitting: Orthopedic Surgery

## 2023-03-19 DIAGNOSIS — R41841 Cognitive communication deficit: Secondary | ICD-10-CM | POA: Diagnosis not present

## 2023-03-19 DIAGNOSIS — M6281 Muscle weakness (generalized): Secondary | ICD-10-CM | POA: Diagnosis not present

## 2023-03-19 DIAGNOSIS — H9193 Unspecified hearing loss, bilateral: Secondary | ICD-10-CM

## 2023-03-19 DIAGNOSIS — H6123 Impacted cerumen, bilateral: Secondary | ICD-10-CM

## 2023-03-19 DIAGNOSIS — R278 Other lack of coordination: Secondary | ICD-10-CM | POA: Diagnosis not present

## 2023-03-19 DIAGNOSIS — Z9181 History of falling: Secondary | ICD-10-CM | POA: Diagnosis not present

## 2023-03-19 DIAGNOSIS — R2681 Unsteadiness on feet: Secondary | ICD-10-CM | POA: Diagnosis not present

## 2023-03-19 DIAGNOSIS — R4701 Aphasia: Secondary | ICD-10-CM | POA: Diagnosis not present

## 2023-03-19 MED ORDER — DEBROX 6.5 % OT SOLN: 5.0000 [drp] | Freq: Two times a day (BID) | OTIC | Status: AC

## 2023-03-19 NOTE — Progress Notes (Signed)
Location:   [facility]  Nursing Home Room Number: 14-A [name]:  SNF [phone]) Provider:  Hazle Nordmann, NP  PCP: Mahlon Gammon, MD  Patient Care Team: Mahlon Gammon, MD as PCP - General (Internal Medicine) Runell Gess, MD as PCP - Cardiology (Cardiology) Carman Ching, MD (Inactive) as Consulting Physician (Gastroenterology) Esaw Dace, MD as Attending Physician (Urology) Runell Gess, MD as Consulting Physician (Cardiology) Drema Halon, MD (Inactive) as Consulting Physician (Otolaryngology) Elizebeth Koller, DDS (Dentistry) Mckinley Jewel, MD as Consulting Physician (Ophthalmology) Bradly Bienenstock, MD as Consulting Physician (Orthopedic Surgery) Gaynelle Adu, MD as Consulting Physician (General Surgery)  Extended Emergency Contact Information Primary Emergency Contact: Sheran Luz Address: [address]          [place], [place] [address] [place] of [place] Home Phone: [phone] Work Phone: [phone] Mobile Phone: [phone] Relation: Daughter Secondary Emergency Contact: Lia Hopping Address: [address]          [place], [place] [address] [place] of [place] Home Phone: [phone] Mobile Phone: [phone] Relation: Son  Code Status:  DNR Goals of care: Advanced Directive information    03/19/2023   10:42 AM  Advanced Directives  Does Patient Have a Medical Advance Directive? Yes  Type of Estate agent of [place];Living will;Out of facility DNR (pink MOST or yellow form)  Does patient want to make changes to medical advance directive? No - Patient declined  Copy of Healthcare Power of Attorney in Chart? Yes - validated most recent copy scanned in chart (See row information)     Chief Complaint  Patient presents with   Acute Visit    Hearing loss.     HPI:  Pt is a 89 y.o. female seen today for acute visit due to hearing loss.   She currently resides on the assisted  living unit at [facility]. PMH: brain aneurysm, venous insufficiency, HTN, TIA, GERD, hypothyroidism, OAB, senile osteoporosis, macular degeneration, OA, and unstable gait.   [place]. Daughter concerned her hearing has declined. She does not use hearing aids. Patient admits to recent poor hearing. She noticed having to increase volume on television. Afebrile. Vitals stable.    Past Medical History:  Diagnosis Date   Cervicalgia    Chest pain at rest 01/07/2012   Displacement of cervical intervertebral disc without myelopathy    Diverticulosis of colon (without mention of hemorrhage)    Female stress incontinence    GERD (gastroesophageal reflux disease)    no peds occasional pepcid   Inguinal hernia without mention of obstruction or gangrene, unilateral or unspecified, (not specified as recurrent)    Internal hemorrhoids without mention of complication    Lumbago    Macular degeneration (senile) of retina, unspecified    Osteoarthrosis, unspecified whether generalized or localized, unspecified site    Other and unspecified hyperlipidemia    Pain in joint, hand    Palpitations    Reflux esophagitis    Scoliosis (and kyphoscoliosis), idiopathic    Senile osteoporosis    Skin disorder    Thyroid disease    TIA (transient ischemic attack)    Unruptured popliteal cyst 06/24/2014   Right knee    Unspecified essential hypertension    Unspecified glaucoma(365.9)    Unspecified hypothyroidism    Unspecified vitamin D deficiency    Past Surgical History:  Procedure Laterality Date   ABDOMINAL HYSTERECTOMY  1975   Dr Dewaine Conger   APPENDECTOMY  1988   cardiolite myocardial perfusion study     DOPPLER ECHOCARDIOGRAPHY     EYE SURGERY Bilateral 2009   cataract removed right eye, Dr Emmit Pomfret   FOOT SURGERY  august 2013   Hewitt, MD   INGUINAL HERNIA REPAIR Bilateral    w/mesh   INGUINAL HERNIA REPAIR Left 08/03/2017   Procedure: LAPAROSCOPIC LEFT INGUINAL HERNIA  REPAIR WITH MESH;  Surgeon: Gaynelle Adu, MD;  Location: [facility] OR;  Service: General;  Laterality: Left;   INGUINAL HERNIA REPAIR Right 08/03/2017   Procedure: HERNIA REPAIR RIGHT INGUINAL ADULT WITH MESH;  Surgeon: Gaynelle Adu, MD;  Location: [facility] OR;  Service: General;  Laterality: Right;   INGUINAL HERNIA REPAIR     Dr. Andrey Campanile 04-24-18   INGUINAL HERNIA REPAIR Right 04/24/2018   Procedure: DIAGNOSTIC LAPAROSCOPIC, OPENREPAIR OF RECURRENT RIGHT INGUINAL HERNIA WITH MESH ERAS PATHWAY;  Surgeon: Gaynelle Adu, MD;  Location: [facility] ORS;  Service: General;  Laterality: Right;   INSERTION OF MESH Bilateral 08/03/2017   Procedure: INSERTION OF MESH;  Surgeon: Gaynelle Adu, MD;  Location: [facility] OR;  Service: General;  Laterality: Bilateral;   NM MYOVIEW LTD     negative   TONSILLECTOMY  1937   TOOTH EXTRACTION  09/16/13   Dr Randel Books    Allergies  Allergen Reactions   Trimethoprim Other (See Comments)    Headaches/ ear pressure    Erythromycin Other (See Comments)    UNSPECIFIED REACTION    Macrodantin [Nitrofurantoin Macrocrystal] Other (See Comments)    UNSPECIFIED REACTION    Other     Honeydew Melon   Penicillins Other (See Comments)    UNSPECIFIED REACTION  Has patient had a PCN reaction causing immediate rash, facial/tongue/throat swelling, SOB or lightheadedness with hypotension: Unknown Has patient had a PCN reaction causing severe rash involving mucus membranes or skin necrosis: Unknown Has patient had a PCN reaction that required hospitalization: No Has patient had a PCN reaction occurring within the last 10 years: No If all of the above answers are "NO", then may proceed with Cephalosporin use.   Sulfa Antibiotics Other (See Comments)    UNSPECIFIED REACTION    Latex Itching    Allergies as of 03/19/2023       Reactions   Trimethoprim Other (See Comments)   Headaches/ ear pressure    Erythromycin Other (See Comments)   UNSPECIFIED REACTION    Macrodantin [nitrofurantoin  Macrocrystal] Other (See Comments)   UNSPECIFIED REACTION    Other    Honeydew Melon   Penicillins Other (See Comments)   UNSPECIFIED REACTION  Has patient had a PCN reaction causing immediate rash, facial/tongue/throat swelling, SOB or lightheadedness with hypotension: Unknown Has patient had a PCN reaction causing severe rash involving mucus membranes or skin necrosis: Unknown Has patient had a PCN reaction that required hospitalization: No Has patient had a PCN reaction occurring within the last 10 years: No If all of the above answers are "NO", then may proceed with Cephalosporin use.   Sulfa Antibiotics Other (See Comments)   UNSPECIFIED REACTION    Latex Itching        Medication List        Accurate as of March 19, 2023 10:45 AM. If you have any questions, ask your nurse or doctor.          acetaminophen 500 MG tablet Commonly known as: TYLENOL Take 500 mg by mouth 2 (two) times daily as needed for mild pain or headache.   ascorbic acid 500 MG  tablet Commonly known as: VITAMIN C Take 500 mg by mouth daily with supper.   AZO CRANBERRY PO Take 1 tablet by mouth daily.   cephALEXin 250 MG capsule Commonly known as: KEFLEX Take 250 mg by mouth daily.   clopidogrel 75 MG tablet Commonly known as: PLAVIX TAKE ONE TABLET BY MOUTH DAILY   metoprolol succinate 25 MG 24 hr tablet Commonly known as: TOPROL-XL Take one tablet by mouth once daily to regulate heart and control blood pressure.   PRESERVISION AREDS 2 PO Take 1 capsule by mouth 2 (two) times daily.   Vitamin D3 50 MCG (2000 UT) Tabs Take by mouth.   Zinc Oxide 10 % Oint Apply 1 Application topically as needed.        Review of Systems  Constitutional:  Negative for fatigue and fever.  HENT:  Positive for hearing loss. Negative for ear discharge and ear pain.   Respiratory:  Negative for cough and shortness of breath.   Cardiovascular:  Negative for chest pain and leg swelling.   Psychiatric/Behavioral:  Negative for dysphoric mood. The patient is not nervous/anxious.     Immunization History  Administered Date(s) Administered   Fluad Quad(high Dose 65+) 02/04/2019, 03/01/2022   Influenza Whole 02/24/2011, 02/12/2012   Influenza, High Dose Seasonal PF 02/01/2017, 02/25/2018, 02/11/2020, 03/13/2023   Influenza,inj,Quad PF,6+ Mos 02/20/2013, 02/10/2014, 02/19/2015, 02/18/2016   Influenza-Unspecified 02/16/2021   Moderna Sars-Covid-2 Vaccination 05/19/2019, 06/16/2019, 03/29/2020, 09/27/2020   Pneumococcal Conjugate-13 06/24/2014   Pneumococcal Polysaccharide-23 04/12/1998   Td 02/21/1996, 07/23/2003   Tdap 03/20/2010   Zoster Recombinant(Shingrix) 09/19/2016, 12/02/2016   Zoster, Live 09/15/2005   Pertinent  Health Maintenance Due  Topic Date Due   INFLUENZA VACCINE  Completed   DEXA SCAN  Completed      03/08/2022    2:52 PM 07/31/2022   11:27 AM 08/23/2022    1:05 PM 09/06/2022    2:27 PM 12/27/2022    2:15 PM  Fall Risk  Falls in the past year? 0 1 1 1  0  Was there an injury with Fall? 0 1 0 1 0  Fall Risk Category Calculator 0 2 1 2  0  Fall Risk Category (Retired) Low      (RETIRED) Patient Fall Risk Level Low fall risk      Patient at Risk for Falls Due to No Fall Risks Impaired balance/gait;Impaired mobility History of fall(s) History of fall(s);Impaired balance/gait;Impaired mobility No Fall Risks  Fall risk Follow up  Falls evaluation completed;Education provided;Falls prevention discussed Falls evaluation completed Falls evaluation completed;Education provided;Falls prevention discussed Falls evaluation completed;Education provided;Falls prevention discussed   Functional Status Survey:    Vitals:   03/19/23 1039  BP: (!) 145/90  Pulse: 66  Resp: 18  Temp: (!) 97.4 F (36.3 C)  SpO2: 95%  Weight: 162 lb (73.5 kg)  Height: 5\' 4"  (1.626 m)   Body mass index is 27.81 kg/m. Physical Exam Vitals reviewed.  Constitutional:      General:  She is not in acute distress. HENT:     Head: Normocephalic.     Right Ear: There is impacted cerumen.     Left Ear: There is impacted cerumen.     Nose: Nose normal.  Eyes:     General:        Right eye: No discharge.        Left eye: No discharge.  Cardiovascular:     Rate and Rhythm: Normal rate and regular rhythm.     Pulses:  Normal pulses.     Heart sounds: Normal heart sounds.  Pulmonary:     Effort: Pulmonary effort is normal.     Breath sounds: Normal breath sounds.  Skin:    Capillary Refill: Capillary refill takes less than 2 seconds.  Neurological:     General: No focal deficit present.     Mental Status: She is alert and oriented to person, place, and time.     Motor: Weakness present.     Gait: Gait abnormal.  Psychiatric:        Mood and Affect: Mood normal.     Labs reviewed: Recent Labs    03/20/22 0822 07/26/22 1715 10/31/22 0000 03/05/23 0000  NA 141 139 141 140  K 4.1 4.2 3.7 4.2  CL 105 102 104 105  CO2 29 27 24* 27*  GLUCOSE 87 100*  --   --   BUN 25 18 29* 24*  CREATININE 0.63 0.74 0.8 0.6  CALCIUM 9.5 9.8 9.5 8.8   Recent Labs    03/20/22 0822 10/31/22 0000 03/05/23 0000  AST 17 21 14   ALT 16 18 16   ALKPHOS  --  71 71  BILITOT 0.6  --   --   PROT 6.3  --   --   ALBUMIN  --  4.1 3.4*   Recent Labs    07/26/22 1715 10/31/22 0000 03/05/23 0000  WBC 7.0 10.3 7.4  NEUTROABS 4.1  --  4,151.00  HGB 13.6 13.8 12.2  HCT 41.0 42 37  MCV 92.6  --   --   PLT 207 234 219   Lab Results  Component Value Date   TSH 3.20 10/31/2022   Lab Results  Component Value Date   HGBA1C 5.3 05/02/2013   Lab Results  Component Value Date   CHOL 188 07/10/2019   HDL 75 07/10/2019   LDLCALC 94 07/10/2019   TRIG 96 07/10/2019   CHOLHDL 2.5 07/10/2019    Significant Diagnostic Results in last 30 days:  No results found.  Assessment/Plan 1. Bilateral impacted cerumen - unable to visualize TM due to wax - ? Cause of hearing loss - flush  ears when Debrox complete - carbamide peroxide (DEBROX) 6.5 % OTIC solution; Place 5 drops into both ears 2 (two) times daily for 5 days.  2. Bilateral hearing loss, unspecified hearing loss type - see above - recommend [facility] hearing consult on campus if above intervention fails    Family/ staff Communication: plan discussed with patient and nurse  Labs/tests ordered: none

## 2023-03-21 DIAGNOSIS — Z23 Encounter for immunization: Secondary | ICD-10-CM | POA: Diagnosis not present

## 2023-03-21 DIAGNOSIS — R4701 Aphasia: Secondary | ICD-10-CM | POA: Diagnosis not present

## 2023-03-21 DIAGNOSIS — Z9181 History of falling: Secondary | ICD-10-CM | POA: Diagnosis not present

## 2023-03-21 DIAGNOSIS — R278 Other lack of coordination: Secondary | ICD-10-CM | POA: Diagnosis not present

## 2023-03-21 DIAGNOSIS — R2681 Unsteadiness on feet: Secondary | ICD-10-CM | POA: Diagnosis not present

## 2023-03-21 DIAGNOSIS — M6281 Muscle weakness (generalized): Secondary | ICD-10-CM | POA: Diagnosis not present

## 2023-03-21 DIAGNOSIS — R41841 Cognitive communication deficit: Secondary | ICD-10-CM | POA: Diagnosis not present

## 2023-03-22 ENCOUNTER — Non-Acute Institutional Stay (SKILLED_NURSING_FACILITY): Payer: Self-pay | Admitting: Internal Medicine

## 2023-03-22 DIAGNOSIS — R2681 Unsteadiness on feet: Secondary | ICD-10-CM

## 2023-03-22 DIAGNOSIS — I10 Essential (primary) hypertension: Secondary | ICD-10-CM | POA: Diagnosis not present

## 2023-03-22 DIAGNOSIS — N390 Urinary tract infection, site not specified: Secondary | ICD-10-CM | POA: Diagnosis not present

## 2023-03-22 DIAGNOSIS — G459 Transient cerebral ischemic attack, unspecified: Secondary | ICD-10-CM | POA: Diagnosis not present

## 2023-03-23 DIAGNOSIS — Z9181 History of falling: Secondary | ICD-10-CM | POA: Diagnosis not present

## 2023-03-23 DIAGNOSIS — R4701 Aphasia: Secondary | ICD-10-CM | POA: Diagnosis not present

## 2023-03-23 DIAGNOSIS — M6281 Muscle weakness (generalized): Secondary | ICD-10-CM | POA: Diagnosis not present

## 2023-03-23 DIAGNOSIS — R41841 Cognitive communication deficit: Secondary | ICD-10-CM | POA: Diagnosis not present

## 2023-03-23 DIAGNOSIS — R2681 Unsteadiness on feet: Secondary | ICD-10-CM | POA: Diagnosis not present

## 2023-03-23 DIAGNOSIS — R278 Other lack of coordination: Secondary | ICD-10-CM | POA: Diagnosis not present

## 2023-03-25 ENCOUNTER — Encounter: Payer: Self-pay | Admitting: Internal Medicine

## 2023-03-25 NOTE — Progress Notes (Signed)
Provider:   Location:  [facility] curator of Service:  SNF (31)  PCP: Mahlon Gammon, MD Patient Care Team: Mahlon Gammon, MD as PCP - General (Internal Medicine) Runell Gess, MD as PCP - Cardiology (Cardiology) Carman Ching, MD (Inactive) as Consulting Physician (Gastroenterology) Esaw Dace, MD as Attending Physician (Urology) Runell Gess, MD as Consulting Physician (Cardiology) Drema Halon, MD (Inactive) as Consulting Physician (Otolaryngology) Elizebeth Koller, DDS (Dentistry) Mckinley Jewel, MD as Consulting Physician (Ophthalmology) Bradly Bienenstock, MD as Consulting Physician (Orthopedic Surgery) Gaynelle Adu, MD as Consulting Physician (General Surgery)  Extended Emergency Contact Information Primary Emergency Contact: Sheran Luz Address: [address]          [place], [place] [address] D[place] of [place] Home Phone: [phone] Work Phone: [phone] Mobile Phone: [phone] Relation: Daughter Secondary Emergency Contact: Lia Hopping Address: [address]          [place], [place] [address] [place] of [place] Home Phone: [phone] Mobile Phone: [phone] Relation: Son  Code Status: DNR Goals of Care: Advanced Directive information    03/19/2023   10:42 AM  Advanced Directives  Does Patient Have a Medical Advance Directive? Yes  Type of Estate agent of [place];Living will;Out of facility DNR (pink MOST or yellow form)  Does patient want to make changes to medical advance directive? No - Patient declined  Copy of Healthcare Power of Attorney in Chart? Yes - validated most recent copy scanned in chart (See row information)      Chief Complaint  Patient presents with   New Admit To SNF    HPI: Patient is a 89 y.o. female seen today for admission to SNF  [facility] due to needing more help for her ADLS  Patient has a history of hypertension,  hyperlipidemia, macular degeneration, mild cognitive impairment, recurrent UTI, GERD, H/o TIA    Patient has been recently Physically declining and needing more help with her ADLS Had no complains today Adjusting to SNF Working with therapy   Past Medical History:  Diagnosis Date   Cervicalgia    Chest pain at rest 01/07/2012   Displacement of cervical intervertebral disc without myelopathy    Diverticulosis of colon (without mention of hemorrhage)    Female stress incontinence    GERD (gastroesophageal reflux disease)    no peds occasional pepcid   Inguinal hernia without mention of obstruction or gangrene, unilateral or unspecified, (not specified as recurrent)    Internal hemorrhoids without mention of complication    Lumbago    Macular degeneration (senile) of retina, unspecified    Osteoarthrosis, unspecified whether generalized or localized, unspecified site    Other and unspecified hyperlipidemia    Pain in joint, hand    Palpitations    Reflux esophagitis    Scoliosis (and kyphoscoliosis), idiopathic    Senile osteoporosis    Skin disorder    Thyroid disease    TIA (transient ischemic attack)    Unruptured popliteal cyst 06/24/2014   Right knee    Unspecified essential hypertension    Unspecified glaucoma(365.9)    Unspecified hypothyroidism    Unspecified vitamin D deficiency    Past Surgical History:  Procedure Laterality Date   ABDOMINAL HYSTERECTOMY  1975   Dr Dewaine Conger   APPENDECTOMY  1988   cardiolite myocardial perfusion study     DOPPLER ECHOCARDIOGRAPHY     EYE SURGERY Bilateral 2009   cataract removed right  eye, Dr Emmit Pomfret   FOOT SURGERY  august 2013   Hewitt, MD   INGUINAL HERNIA REPAIR Bilateral    w/mesh   INGUINAL HERNIA REPAIR Left 08/03/2017   Procedure: LAPAROSCOPIC LEFT INGUINAL HERNIA REPAIR WITH MESH;  Surgeon: Gaynelle Adu, MD;  Location: [facility] OR;  Service: General;  Laterality: Left;   INGUINAL HERNIA REPAIR Right 08/03/2017   Procedure: HERNIA  REPAIR RIGHT INGUINAL ADULT WITH MESH;  Surgeon: Gaynelle Adu, MD;  Location: [facility] OR;  Service: General;  Laterality: Right;   INGUINAL HERNIA REPAIR     Dr. Andrey Campanile 04-24-18   INGUINAL HERNIA REPAIR Right 04/24/2018   Procedure: DIAGNOSTIC LAPAROSCOPIC, OPENREPAIR OF RECURRENT RIGHT INGUINAL HERNIA WITH MESH ERAS PATHWAY;  Surgeon: Gaynelle Adu, MD;  Location: [facility] ORS;  Service: General;  Laterality: Right;   INSERTION OF MESH Bilateral 08/03/2017   Procedure: INSERTION OF MESH;  Surgeon: Gaynelle Adu, MD;  Location: [facility] OR;  Service: General;  Laterality: Bilateral;   NM MYOVIEW LTD     negative   TONSILLECTOMY  1937   TOOTH EXTRACTION  09/16/13   Dr Randel Books    reports that she has never smoked. She has never used smokeless tobacco. She reports that she does not drink alcohol and does not use drugs. Social History   Socioeconomic History   Marital status: Widowed    Spouse name: Not on file   Number of children: 2   Years of education: college   Highest education level: Not on file  Occupational History    Comment: retired  Tobacco Use   Smoking status: Never   Smokeless tobacco: Never  Vaping Use   Vaping status: Never Used  Substance and Sexual Activity   Alcohol use: No   Drug use: No   Sexual activity: Not Currently  Other Topics Concern   Not on file  Social History Narrative   Patient lives at home with her daughter Larita Fife) , moved to [facility] 02/02/16 [place]    Widowed.   Retired.   EducationPsychiatrist.   Right handed.   Caffeine- None some times tea very rare.   Never smoked   Alcohol none   Social Determinants of Health   Financial Resource Strain: Low Risk  (09/06/2022)   Overall Financial Resource Strain (CARDIA)    Difficulty of Paying Living Expenses: Not hard at all  Food Insecurity: No Food Insecurity (09/06/2022)   Hunger Vital Sign    Worried About Running Out of Food in the Last Year: Never true    Ran Out of Food in the Last Year: Never true   Transportation Needs: No Transportation Needs (09/06/2022)   PRAPARE - Administrator, Civil Service (Medical): No    Lack of Transportation (Non-Medical): No  Physical Activity: Insufficiently Active (09/06/2022)   Exercise Vital Sign    Days of Exercise per Week: 3 days    Minutes of Exercise per Session: 10 min  Stress: No Stress Concern Present (09/06/2022)   [employer] of Occupational Health - Occupational Stress Questionnaire    Feeling of Stress : Only a little  Social Connections: Moderately Integrated (09/06/2022)   Social Connection and Isolation Panel [NHANES]    Frequency of Communication with Friends and Family: More than three times a week    Frequency of Social Gatherings with Friends and Family: More than three times a week    Attends Religious Services: More than 4 times per year    Active Member of [employer]  or Organizations: Yes    Attends Banker Meetings: More than 4 times per year    Marital Status: Widowed  Intimate Partner Violence: Not At Risk (09/06/2022)   Humiliation, Afraid, Rape, and Kick questionnaire    Fear of Current or Ex-Partner: No    Emotionally Abused: No    Physically Abused: No    Sexually Abused: No    Functional Status Survey:    Family History  Problem Relation Age of Onset   CAD Mother    Parkinson's disease Mother    Cancer Father        colon cancer   Parkinson's disease Father    Stroke Maternal Grandmother     Health Maintenance  Topic Date Due   DTaP/Tdap/Td (4 - Td or Tdap) 03/20/2020   COVID-19 Vaccine (5 - 2023-24 season) 01/14/2023   Medicare Annual Wellness (AWV)  09/06/2023   Pneumonia Vaccine 31+ Years old  Completed   INFLUENZA VACCINE  Completed   DEXA SCAN  Completed   Zoster Vaccines- Shingrix  Completed   HPV VACCINES  Aged Out    Allergies  Allergen Reactions   Trimethoprim Other (See Comments)    Headaches/ ear pressure    Erythromycin Other (See Comments)    UNSPECIFIED  REACTION    Macrodantin [Nitrofurantoin Macrocrystal] Other (See Comments)    UNSPECIFIED REACTION    Other     Honeydew Melon   Penicillins Other (See Comments)    UNSPECIFIED REACTION  Has patient had a PCN reaction causing immediate rash, facial/tongue/throat swelling, SOB or lightheadedness with hypotension: Unknown Has patient had a PCN reaction causing severe rash involving mucus membranes or skin necrosis: Unknown Has patient had a PCN reaction that required hospitalization: No Has patient had a PCN reaction occurring within the last 10 years: No If all of the above answers are "NO", then may proceed with Cephalosporin use.   Sulfa Antibiotics Other (See Comments)    UNSPECIFIED REACTION    Latex Itching    Outpatient Encounter Medications as of 03/22/2023  Medication Sig   acetaminophen (TYLENOL) 500 MG tablet Take 500 mg by mouth 2 (two) times daily as needed for mild pain or headache.    [EXPIRED] carbamide peroxide (DEBROX) 6.5 % OTIC solution Place 5 drops into both ears 2 (two) times daily for 5 days.   cephALEXin (KEFLEX) 250 MG capsule Take 250 mg by mouth daily.   Cholecalciferol (VITAMIN D3) 50 MCG (2000 UT) TABS Take by mouth.   clopidogrel (PLAVIX) 75 MG tablet TAKE ONE TABLET BY MOUTH DAILY   Cranberry-Vitamin C-Probiotic (AZO CRANBERRY PO) Take 1 tablet by mouth daily.   metoprolol succinate (TOPROL-XL) 25 MG 24 hr tablet Take one tablet by mouth once daily to regulate heart and control blood pressure.   Multiple Vitamins-Minerals (PRESERVISION AREDS 2 PO) Take 1 capsule by mouth 2 (two) times daily.    vitamin C (ASCORBIC ACID) 500 MG tablet Take 500 mg by mouth daily with supper.    Zinc Oxide 10 % OINT Apply 1 Application topically as needed.   No facility-administered encounter medications on file as of 03/22/2023.    Review of Systems  Constitutional:  Positive for activity change. Negative for appetite change.  HENT: Negative.    Respiratory:  Negative for  cough and shortness of breath.   Cardiovascular:  Negative for leg swelling.  Gastrointestinal:  Negative for constipation.  Genitourinary: Negative.   Musculoskeletal:  Positive for gait problem. Negative for arthralgias  and myalgias.  Skin: Negative.   Neurological:  Positive for weakness. Negative for dizziness.  Psychiatric/Behavioral:  Negative for confusion, dysphoric mood and sleep disturbance.     Vitals:   03/22/23 1949  BP: (!) 148/76  Pulse: 72  Resp: 18  Temp: 97.7 F (36.5 C)  Weight: 162 lb (73.5 kg)   Body mass index is 27.81 kg/m. Physical Exam Vitals reviewed.  Constitutional:      Appearance: Normal appearance.  HENT:     Head: Normocephalic.     Nose: Nose normal.     Mouth/Throat:     Mouth: Mucous membranes are moist.     Pharynx: Oropharynx is clear.  Eyes:     Pupils: Pupils are equal, round, and reactive to light.  Cardiovascular:     Rate and Rhythm: Normal rate and regular rhythm.     Pulses: Normal pulses.     Heart sounds: Normal heart sounds. No murmur heard. Pulmonary:     Effort: Pulmonary effort is normal.     Breath sounds: Normal breath sounds.  Abdominal:     General: Abdomen is flat. Bowel sounds are normal.     Palpations: Abdomen is soft.  Musculoskeletal:        General: No swelling.     Cervical back: Neck supple.  Skin:    General: Skin is warm.  Neurological:     General: No focal deficit present.     Mental Status: She is alert.  Psychiatric:        Mood and Affect: Mood normal.        Thought Content: Thought content normal.     Labs reviewed: Basic Metabolic Panel: Recent Labs    07/26/22 1715 10/31/22 0000 03/05/23 0000  NA 139 141 140  K 4.2 3.7 4.2  CL 102 104 105  CO2 27 24* 27*  GLUCOSE 100*  --   --   BUN 18 29* 24*  CREATININE 0.74 0.8 0.6  CALCIUM 9.8 9.5 8.8   Liver Function Tests: Recent Labs    10/31/22 0000 03/05/23 0000  AST 21 14  ALT 18 16  ALKPHOS 71 71  ALBUMIN 4.1 3.4*    No results for input(s): "LIPASE", "AMYLASE" in the last 8760 hours. No results for input(s): "AMMONIA" in the last 8760 hours. CBC: Recent Labs    07/26/22 1715 10/31/22 0000 03/05/23 0000  WBC 7.0 10.3 7.4  NEUTROABS 4.1  --  4,151.00  HGB 13.6 13.8 12.2  HCT 41.0 42 37  MCV 92.6  --   --   PLT 207 234 219   Cardiac Enzymes: No results for input(s): "CKTOTAL", "CKMB", "CKMBINDEX", "TROPONINI" in the last 8760 hours. BNP: Invalid input(s): "POCBNP" Lab Results  Component Value Date   HGBA1C 5.3 05/02/2013   Lab Results  Component Value Date   TSH 3.20 10/31/2022   No results found for: "VITAMINB12" No results found for: "FOLATE" No results found for: "IRON", "TIBC", "FERRITIN"  Imaging and Procedures obtained prior to SNF admission: CT Head Wo Contrast  Result Date: 07/26/2022 CLINICAL DATA:  Trip and fall, headache and dizziness EXAM: CT HEAD WITHOUT CONTRAST CT CERVICAL SPINE WITHOUT CONTRAST TECHNIQUE: Multidetector CT imaging of the head and cervical spine was performed following the standard protocol without intravenous contrast. Multiplanar CT image reconstructions of the cervical spine were also generated. RADIATION DOSE REDUCTION: This exam was performed according to the departmental dose-optimization program which includes automated exposure control, adjustment of the mA and/or kV  according to patient size and/or use of iterative reconstruction technique. COMPARISON:  None Available. FINDINGS: CT HEAD FINDINGS Brain: No evidence of acute infarction, hemorrhage, hydrocephalus, extra-axial collection or mass lesion/mass effect. Periventricular and deep white matter hypodensity. Mild global cerebral volume loss. Vascular: No hyperdense vessel or unexpected calcification. Skull: Normal. Negative for fracture or focal lesion. Sinuses/Orbits: No acute finding. Other: None. CT CERVICAL SPINE FINDINGS Alignment: Normal. Skull base and vertebrae: No acute fracture. No primary  bone lesion or focal pathologic process. Soft tissues and spinal canal: No prevertebral fluid or swelling. No visible canal hematoma. Disc levels: Severe disc space height loss and osteophytosis throughout the cervical spine. Upper chest: Negative. Other: None. IMPRESSION: 1. No acute intracranial pathology. Small-vessel white matter disease and global cerebral volume loss. 2. No fracture or static subluxation of the cervical spine. 3. Severe multilevel cervical disc degenerative disease. Electronically Signed   By: Jearld Lesch M.D.   On: 07/26/2022 17:35   CT Cervical Spine Wo Contrast  Result Date: 07/26/2022 CLINICAL DATA:  Trip and fall, headache and dizziness EXAM: CT HEAD WITHOUT CONTRAST CT CERVICAL SPINE WITHOUT CONTRAST TECHNIQUE: Multidetector CT imaging of the head and cervical spine was performed following the standard protocol without intravenous contrast. Multiplanar CT image reconstructions of the cervical spine were also generated. RADIATION DOSE REDUCTION: This exam was performed according to the departmental dose-optimization program which includes automated exposure control, adjustment of the mA and/or kV according to patient size and/or use of iterative reconstruction technique. COMPARISON:  None Available. FINDINGS: CT HEAD FINDINGS Brain: No evidence of acute infarction, hemorrhage, hydrocephalus, extra-axial collection or mass lesion/mass effect. Periventricular and deep white matter hypodensity. Mild global cerebral volume loss. Vascular: No hyperdense vessel or unexpected calcification. Skull: Normal. Negative for fracture or focal lesion. Sinuses/Orbits: No acute finding. Other: None. CT CERVICAL SPINE FINDINGS Alignment: Normal. Skull base and vertebrae: No acute fracture. No primary bone lesion or focal pathologic process. Soft tissues and spinal canal: No prevertebral fluid or swelling. No visible canal hematoma. Disc levels: Severe disc space height loss and osteophytosis  throughout the cervical spine. Upper chest: Negative. Other: None. IMPRESSION: 1. No acute intracranial pathology. Small-vessel white matter disease and global cerebral volume loss. 2. No fracture or static subluxation of the cervical spine. 3. Severe multilevel cervical disc degenerative disease. Electronically Signed   By: Jearld Lesch M.D.   On: 07/26/2022 17:35    Assessment/Plan 1. Essential hypertension Doing well on Metoprolol  2. TIA (transient ischemic attack) On Plavix  Not on statin due to her age   62. Recurrent UTI Keflex  4. Unstable gait Working with therapy Now in SNF level of care  Labs done recently were all in good Limits   Family/ staff Communication:   Labs/tests ordered:

## 2023-03-26 ENCOUNTER — Encounter: Payer: Self-pay | Admitting: Orthopedic Surgery

## 2023-03-26 NOTE — Progress Notes (Signed)
Location:   [facility]  Nursing Home Room Number: 14-A [name]:  SNF [phone]) Provider:  Hazle Nordmann, NP  PCP: Mahlon Gammon, MD  Patient Care Team: Mahlon Gammon, MD as PCP - General (Internal Medicine) Runell Gess, MD as PCP - Cardiology (Cardiology) Carman Ching, MD (Inactive) as Consulting Physician (Gastroenterology) Esaw Dace, MD as Attending Physician (Urology) Runell Gess, MD as Consulting Physician (Cardiology) Drema Halon, MD (Inactive) as Consulting Physician (Otolaryngology) Elizebeth Koller, DDS (Dentistry) Mckinley Jewel, MD as Consulting Physician (Ophthalmology) Bradly Bienenstock, MD as Consulting Physician (Orthopedic Surgery) Gaynelle Adu, MD as Consulting Physician (General Surgery)  Extended Emergency Contact Information Primary Emergency Contact: Sheran Luz Address: [address]          [place], [place] [address] Darden [place] of [place] Home Phone: [phone] Work Phone: [phone] Mobile Phone: [phone] Relation: Daughter Secondary Emergency Contact: Lia Hopping Address: [address]          [place], [place] [address] Darden [place] of [place] Home Phone: [phone] Mobile Phone: [phone] Relation: Son  Code Status:  DNR Goals of care: Advanced Directive information    03/26/2023    9:48 AM  Advanced Directives  Does Patient Have a Medical Advance Directive? Yes  Type of Estate agent of [place];Living will;Out of facility DNR (pink MOST or yellow form)  Does patient want to make changes to medical advance directive? No - Patient declined  Copy of Healthcare Power of Attorney in Chart? Yes - validated most recent copy scanned in chart (See row information)     Chief Complaint  Patient presents with   Medical Management of Chronic Issues    Routine Visit.    Immunizations    Discuss the need for DTAP vaccine, and Covid Booster.     HPI:  Pt is a 89  y.o. female seen today for medical management of chronic diseases.       Past Medical History:  Diagnosis Date   Cervicalgia    Chest pain at rest 01/07/2012   Displacement of cervical intervertebral disc without myelopathy    Diverticulosis of colon (without mention of hemorrhage)    Female stress incontinence    GERD (gastroesophageal reflux disease)    no peds occasional pepcid   Inguinal hernia without mention of obstruction or gangrene, unilateral or unspecified, (not specified as recurrent)    Internal hemorrhoids without mention of complication    Lumbago    Macular degeneration (senile) of retina, unspecified    Osteoarthrosis, unspecified whether generalized or localized, unspecified site    Other and unspecified hyperlipidemia    Pain in joint, hand    Palpitations    Reflux esophagitis    Scoliosis (and kyphoscoliosis), idiopathic    Senile osteoporosis    Skin disorder    Thyroid disease    TIA (transient ischemic attack)    Unruptured popliteal cyst 06/24/2014   Right knee    Unspecified essential hypertension    Unspecified glaucoma(365.9)    Unspecified hypothyroidism    Unspecified vitamin D deficiency    Past Surgical History:  Procedure Laterality Date   ABDOMINAL HYSTERECTOMY  1975   Dr Dewaine Conger   APPENDECTOMY  1988   cardiolite myocardial perfusion study     DOPPLER ECHOCARDIOGRAPHY     EYE SURGERY Bilateral 2009   cataract removed right eye, Dr Emmit Pomfret   FOOT SURGERY  august 2013   Hewitt, MD   INGUINAL HERNIA REPAIR Bilateral  w/mesh   INGUINAL HERNIA REPAIR Left 08/03/2017   Procedure: LAPAROSCOPIC LEFT INGUINAL HERNIA REPAIR WITH MESH;  Surgeon: Gaynelle Adu, MD;  Location: [facility] OR;  Service: General;  Laterality: Left;   INGUINAL HERNIA REPAIR Right 08/03/2017   Procedure: HERNIA REPAIR RIGHT INGUINAL ADULT WITH MESH;  Surgeon: Gaynelle Adu, MD;  Location: [facility] OR;  Service: General;  Laterality: Right;   INGUINAL HERNIA REPAIR     Dr. Andrey Campanile  04-24-18   INGUINAL HERNIA REPAIR Right 04/24/2018   Procedure: DIAGNOSTIC LAPAROSCOPIC, OPENREPAIR OF RECURRENT RIGHT INGUINAL HERNIA WITH MESH ERAS PATHWAY;  Surgeon: Gaynelle Adu, MD;  Location: [facility] ORS;  Service: General;  Laterality: Right;   INSERTION OF MESH Bilateral 08/03/2017   Procedure: INSERTION OF MESH;  Surgeon: Gaynelle Adu, MD;  Location: [facility] OR;  Service: General;  Laterality: Bilateral;   NM MYOVIEW LTD     negative   TONSILLECTOMY  1937   TOOTH EXTRACTION  09/16/13   Dr Randel Books    Allergies  Allergen Reactions   Trimethoprim Other (See Comments)    Headaches/ ear pressure    Erythromycin Other (See Comments)    UNSPECIFIED REACTION    Macrodantin [Nitrofurantoin Macrocrystal] Other (See Comments)    UNSPECIFIED REACTION    Other     Honeydew Melon   Penicillins Other (See Comments)    UNSPECIFIED REACTION  Has patient had a PCN reaction causing immediate rash, facial/tongue/throat swelling, SOB or lightheadedness with hypotension: Unknown Has patient had a PCN reaction causing severe rash involving mucus membranes or skin necrosis: Unknown Has patient had a PCN reaction that required hospitalization: No Has patient had a PCN reaction occurring within the last 10 years: No If all of the above answers are "NO", then may proceed with Cephalosporin use.   Sulfa Antibiotics Other (See Comments)    UNSPECIFIED REACTION    Latex Itching    Allergies as of 03/26/2023       Reactions   Trimethoprim Other (See Comments)   Headaches/ ear pressure    Erythromycin Other (See Comments)   UNSPECIFIED REACTION    Macrodantin [nitrofurantoin Macrocrystal] Other (See Comments)   UNSPECIFIED REACTION    Other    Honeydew Melon   Penicillins Other (See Comments)   UNSPECIFIED REACTION  Has patient had a PCN reaction causing immediate rash, facial/tongue/throat swelling, SOB or lightheadedness with hypotension: Unknown Has patient had a PCN reaction causing severe rash  involving mucus membranes or skin necrosis: Unknown Has patient had a PCN reaction that required hospitalization: No Has patient had a PCN reaction occurring within the last 10 years: No If all of the above answers are "NO", then may proceed with Cephalosporin use.   Sulfa Antibiotics Other (See Comments)   UNSPECIFIED REACTION    Latex Itching        Medication List        Accurate as of March 26, 2023  9:48 AM. If you have any questions, ask your nurse or doctor.          acetaminophen 500 MG tablet Commonly known as: TYLENOL Take 500 mg by mouth 2 (two) times daily as needed for mild pain or headache.   ascorbic acid 500 MG tablet Commonly known as: VITAMIN C Take 500 mg by mouth daily with supper.   AZO CRANBERRY PO Take 1 tablet by mouth daily.   cephALEXin 250 MG capsule Commonly known as: KEFLEX Take 250 mg by mouth daily.   clopidogrel 75 MG tablet Commonly  known as: PLAVIX TAKE ONE TABLET BY MOUTH DAILY   metoprolol succinate 25 MG 24 hr tablet Commonly known as: TOPROL-XL Take one tablet by mouth once daily to regulate heart and control blood pressure.   PRESERVISION AREDS 2 PO Take 1 capsule by mouth 2 (two) times daily.   Vitamin D3 50 MCG (2000 UT) Tabs Take by mouth.   Zinc Oxide 10 % Oint Apply 1 Application topically as needed.        Review of Systems  Immunization History  Administered Date(s) Administered   Fluad Quad(high Dose 65+) 02/04/2019, 03/01/2022   Influenza Whole 02/24/2011, 02/12/2012   Influenza, High Dose Seasonal PF 02/01/2017, 02/25/2018, 02/11/2020, 03/13/2023   Influenza,inj,Quad PF,6+ Mos 02/20/2013, 02/10/2014, 02/19/2015, 02/18/2016   Influenza-Unspecified 02/16/2021   Moderna Covid-19 Fall Seasonal Vaccine 58yrs & older 02/09/2022   Moderna Sars-Covid-2 Vaccination 05/19/2019, 06/16/2019, 03/29/2020, 09/27/2020   Pneumococcal Conjugate-13 06/24/2014   Pneumococcal Polysaccharide-23 04/12/1998   Td  02/21/1996, 07/23/2003   Tdap 03/20/2010   Zoster Recombinant(Shingrix) 09/19/2016, 12/02/2016   Zoster, Live 09/15/2005   Pertinent  Health Maintenance Due  Topic Date Due   INFLUENZA VACCINE  Completed   DEXA SCAN  Completed      03/08/2022    2:52 PM 07/31/2022   11:27 AM 08/23/2022    1:05 PM 09/06/2022    2:27 PM 12/27/2022    2:15 PM  Fall Risk  Falls in the past year? 0 1 1 1  0  Was there an injury with Fall? 0 1 0 1 0  Fall Risk Category Calculator 0 2 1 2  0  Fall Risk Category (Retired) Low      (RETIRED) Patient Fall Risk Level Low fall risk      Patient at Risk for Falls Due to No Fall Risks Impaired balance/gait;Impaired mobility History of fall(s) History of fall(s);Impaired balance/gait;Impaired mobility No Fall Risks  Fall risk Follow up  Falls evaluation completed;Education provided;Falls prevention discussed Falls evaluation completed Falls evaluation completed;Education provided;Falls prevention discussed Falls evaluation completed;Education provided;Falls prevention discussed   Functional Status Survey:    Vitals:   03/26/23 0945  BP: (!) 148/76  Pulse: 72  Resp: 18  Temp: 97.7 F (36.5 C)  SpO2: 94%  Weight: 162 lb (73.5 kg)  Height: 5\' 4"  (1.626 m)   Body mass index is 27.81 kg/m. Physical Exam  Labs reviewed: Recent Labs    07/26/22 1715 10/31/22 0000 03/05/23 0000  NA 139 141 140  K 4.2 3.7 4.2  CL 102 104 105  CO2 27 24* 27*  GLUCOSE 100*  --   --   BUN 18 29* 24*  CREATININE 0.74 0.8 0.6  CALCIUM 9.8 9.5 8.8   Recent Labs    10/31/22 0000 03/05/23 0000  AST 21 14  ALT 18 16  ALKPHOS 71 71  ALBUMIN 4.1 3.4*   Recent Labs    07/26/22 1715 10/31/22 0000 03/05/23 0000  WBC 7.0 10.3 7.4  NEUTROABS 4.1  --  4,151.00  HGB 13.6 13.8 12.2  HCT 41.0 42 37  MCV 92.6  --   --   PLT 207 234 219   Lab Results  Component Value Date   TSH 3.20 10/31/2022   Lab Results  Component Value Date   HGBA1C 5.3 05/02/2013   Lab  Results  Component Value Date   CHOL 188 07/10/2019   HDL 75 07/10/2019   LDLCALC 94 07/10/2019   TRIG 96 07/10/2019   CHOLHDL 2.5 07/10/2019  Significant Diagnostic Results in last 30 days:  No results found.  Assessment/Plan There are no diagnoses linked to this encounter.   Family/ staff Communication:   Labs/tests ordered:     This encounter was created in error - please disregard.

## 2023-03-27 DIAGNOSIS — R41841 Cognitive communication deficit: Secondary | ICD-10-CM | POA: Diagnosis not present

## 2023-03-27 DIAGNOSIS — R278 Other lack of coordination: Secondary | ICD-10-CM | POA: Diagnosis not present

## 2023-03-27 DIAGNOSIS — M6281 Muscle weakness (generalized): Secondary | ICD-10-CM | POA: Diagnosis not present

## 2023-03-27 DIAGNOSIS — Z9181 History of falling: Secondary | ICD-10-CM | POA: Diagnosis not present

## 2023-03-27 DIAGNOSIS — R2681 Unsteadiness on feet: Secondary | ICD-10-CM | POA: Diagnosis not present

## 2023-03-27 DIAGNOSIS — R4701 Aphasia: Secondary | ICD-10-CM | POA: Diagnosis not present

## 2023-03-28 DIAGNOSIS — M6281 Muscle weakness (generalized): Secondary | ICD-10-CM | POA: Diagnosis not present

## 2023-03-28 DIAGNOSIS — R41841 Cognitive communication deficit: Secondary | ICD-10-CM | POA: Diagnosis not present

## 2023-03-28 DIAGNOSIS — H353221 Exudative age-related macular degeneration, left eye, with active choroidal neovascularization: Secondary | ICD-10-CM | POA: Diagnosis not present

## 2023-03-28 DIAGNOSIS — R2681 Unsteadiness on feet: Secondary | ICD-10-CM | POA: Diagnosis not present

## 2023-03-28 DIAGNOSIS — R278 Other lack of coordination: Secondary | ICD-10-CM | POA: Diagnosis not present

## 2023-03-28 DIAGNOSIS — R4701 Aphasia: Secondary | ICD-10-CM | POA: Diagnosis not present

## 2023-03-28 DIAGNOSIS — Z9181 History of falling: Secondary | ICD-10-CM | POA: Diagnosis not present

## 2023-03-29 DIAGNOSIS — R4701 Aphasia: Secondary | ICD-10-CM | POA: Diagnosis not present

## 2023-03-29 DIAGNOSIS — R2681 Unsteadiness on feet: Secondary | ICD-10-CM | POA: Diagnosis not present

## 2023-03-29 DIAGNOSIS — Z9181 History of falling: Secondary | ICD-10-CM | POA: Diagnosis not present

## 2023-03-29 DIAGNOSIS — M6281 Muscle weakness (generalized): Secondary | ICD-10-CM | POA: Diagnosis not present

## 2023-03-29 DIAGNOSIS — R41841 Cognitive communication deficit: Secondary | ICD-10-CM | POA: Diagnosis not present

## 2023-03-29 DIAGNOSIS — R278 Other lack of coordination: Secondary | ICD-10-CM | POA: Diagnosis not present

## 2023-03-30 ENCOUNTER — Non-Acute Institutional Stay (SKILLED_NURSING_FACILITY): Payer: Self-pay | Admitting: Orthopedic Surgery

## 2023-03-30 ENCOUNTER — Encounter: Payer: Self-pay | Admitting: Orthopedic Surgery

## 2023-03-30 DIAGNOSIS — H6123 Impacted cerumen, bilateral: Secondary | ICD-10-CM | POA: Diagnosis not present

## 2023-03-30 DIAGNOSIS — L03115 Cellulitis of right lower limb: Secondary | ICD-10-CM

## 2023-03-30 MED ORDER — DOXYCYCLINE HYCLATE 100 MG PO TABS: 100.0000 mg | ORAL_TABLET | Freq: Two times a day (BID) | ORAL | Status: AC

## 2023-03-30 MED ORDER — DEBROX 6.5 % OT SOLN: 5.0000 [drp] | Freq: Two times a day (BID) | OTIC | Status: AC

## 2023-03-30 NOTE — Progress Notes (Signed)
Location:   [facility]  Nursing Home Room Number: 14-A [name]:  SNF [phone]) Provider:  Hazle Nordmann, NP  PCP: Mahlon Gammon, MD  Patient Care Team: Mahlon Gammon, MD as PCP - General (Internal Medicine) Runell Gess, MD as PCP - Cardiology (Cardiology) Carman Ching, MD (Inactive) as Consulting Physician (Gastroenterology) Esaw Dace, MD as Attending Physician (Urology) Runell Gess, MD as Consulting Physician (Cardiology) Drema Halon, MD (Inactive) as Consulting Physician (Otolaryngology) Elizebeth Koller, DDS (Dentistry) Mckinley Jewel, MD as Consulting Physician (Ophthalmology) Bradly Bienenstock, MD as Consulting Physician (Orthopedic Surgery) Gaynelle Adu, MD as Consulting Physician (General Surgery)  Extended Emergency Contact Information Primary Emergency Contact: Sheran Luz Address: [address]          [place], [place] [address] Darden Amber of [place] Home Phone: [phone] Work Phone: [phone] Mobile Phone: [phone] Relation: Daughter Secondary Emergency Contact: Lia Hopping Address: [address]          [place], [place] [address] Darden Amber of [place] Home Phone: [phone] Mobile Phone: [phone] Relation: Son  Code Status:  DNR Goals of care: Advanced Directive information    03/30/2023   10:02 AM  Advanced Directives  Does Patient Have a Medical Advance Directive? Yes  Type of Estate agent of [place];Living will;Out of facility DNR (pink MOST or yellow form)  Does patient want to make changes to medical advance directive? No - Patient declined  Copy of Healthcare Power of Attorney in Chart? Yes - validated most recent copy scanned in chart (See row information)     Chief Complaint  Patient presents with   Acute Visit    Foot pain.     HPI:  Pt is a 89 y.o. female seen today for acute visit due to right ankle/foot pain.   She currently resides on the  assisted living unit at [facility]. PMH: brain aneurysm, venous insufficiency, HTN, TIA, GERD, hypothyroidism, OAB, senile osteoporosis, macular degeneration, OA, and unstable gait.   Increased right foot/ankle pain began 11/14. No recent fall or injury. She reports pain 5/10, increased with movement and rest, described as burning, no radiation. Afebrile. Vitals stable.   She continues to have trouble hearing. She has completed Debrox x 5 days and ear lavage. Plan to consult [facility] on campus.      Past Medical History:  Diagnosis Date   Cervicalgia    Chest pain at rest 01/07/2012   Displacement of cervical intervertebral disc without myelopathy    Diverticulosis of colon (without mention of hemorrhage)    Female stress incontinence    GERD (gastroesophageal reflux disease)    no peds occasional pepcid   Inguinal hernia without mention of obstruction or gangrene, unilateral or unspecified, (not specified as recurrent)    Internal hemorrhoids without mention of complication    Lumbago    Macular degeneration (senile) of retina, unspecified    Osteoarthrosis, unspecified whether generalized or localized, unspecified site    Other and unspecified hyperlipidemia    Pain in joint, hand    Palpitations    Reflux esophagitis    Scoliosis (and kyphoscoliosis), idiopathic    Senile osteoporosis    Skin disorder    Thyroid disease    TIA (transient ischemic attack)    Unruptured popliteal cyst 06/24/2014   Right knee    Unspecified essential hypertension    Unspecified glaucoma(365.9)    Unspecified hypothyroidism    Unspecified vitamin D deficiency    Past  Surgical History:  Procedure Laterality Date   ABDOMINAL HYSTERECTOMY  1975   Dr Dewaine Conger   APPENDECTOMY  1988   cardiolite myocardial perfusion study     DOPPLER ECHOCARDIOGRAPHY     EYE SURGERY Bilateral 2009   cataract removed right eye, Dr Emmit Pomfret   FOOT SURGERY  august 2013   Hewitt, MD   INGUINAL HERNIA REPAIR Bilateral     w/mesh   INGUINAL HERNIA REPAIR Left 08/03/2017   Procedure: LAPAROSCOPIC LEFT INGUINAL HERNIA REPAIR WITH MESH;  Surgeon: Gaynelle Adu, MD;  Location: [facility] OR;  Service: General;  Laterality: Left;   INGUINAL HERNIA REPAIR Right 08/03/2017   Procedure: HERNIA REPAIR RIGHT INGUINAL ADULT WITH MESH;  Surgeon: Gaynelle Adu, MD;  Location: [facility] OR;  Service: General;  Laterality: Right;   INGUINAL HERNIA REPAIR     Dr. Andrey Campanile 04-24-18   INGUINAL HERNIA REPAIR Right 04/24/2018   Procedure: DIAGNOSTIC LAPAROSCOPIC, OPENREPAIR OF RECURRENT RIGHT INGUINAL HERNIA WITH MESH ERAS PATHWAY;  Surgeon: Gaynelle Adu, MD;  Location: [facility] ORS;  Service: General;  Laterality: Right;   INSERTION OF MESH Bilateral 08/03/2017   Procedure: INSERTION OF MESH;  Surgeon: Gaynelle Adu, MD;  Location: [facility] OR;  Service: General;  Laterality: Bilateral;   NM MYOVIEW LTD     negative   TONSILLECTOMY  1937   TOOTH EXTRACTION  09/16/13   Dr Randel Books    Allergies  Allergen Reactions   Trimethoprim Other (See Comments)    Headaches/ ear pressure    Erythromycin Other (See Comments)    UNSPECIFIED REACTION    Macrodantin [Nitrofurantoin Macrocrystal] Other (See Comments)    UNSPECIFIED REACTION    Other     Honeydew Melon   Penicillins Other (See Comments)    UNSPECIFIED REACTION  Has patient had a PCN reaction causing immediate rash, facial/tongue/throat swelling, SOB or lightheadedness with hypotension: Unknown Has patient had a PCN reaction causing severe rash involving mucus membranes or skin necrosis: Unknown Has patient had a PCN reaction that required hospitalization: No Has patient had a PCN reaction occurring within the last 10 years: No If all of the above answers are "NO", then may proceed with Cephalosporin use.   Sulfa Antibiotics Other (See Comments)    UNSPECIFIED REACTION    Latex Itching    Allergies as of 03/30/2023       Reactions   Trimethoprim Other (See Comments)   Headaches/ ear pressure     Erythromycin Other (See Comments)   UNSPECIFIED REACTION    Macrodantin [nitrofurantoin Macrocrystal] Other (See Comments)   UNSPECIFIED REACTION    Other    Honeydew Melon   Penicillins Other (See Comments)   UNSPECIFIED REACTION  Has patient had a PCN reaction causing immediate rash, facial/tongue/throat swelling, SOB or lightheadedness with hypotension: Unknown Has patient had a PCN reaction causing severe rash involving mucus membranes or skin necrosis: Unknown Has patient had a PCN reaction that required hospitalization: No Has patient had a PCN reaction occurring within the last 10 years: No If all of the above answers are "NO", then may proceed with Cephalosporin use.   Sulfa Antibiotics Other (See Comments)   UNSPECIFIED REACTION    Latex Itching        Medication List        Accurate as of March 30, 2023 10:03 AM. If you have any questions, ask your nurse or doctor.          acetaminophen 500 MG tablet Commonly known as: TYLENOL Take 500  mg by mouth 2 (two) times daily as needed for mild pain or headache.   ascorbic acid 500 MG tablet Commonly known as: VITAMIN C Take 500 mg by mouth daily with supper.   AZO CRANBERRY PO Take 1 tablet by mouth daily.   cephALEXin 250 MG capsule Commonly known as: KEFLEX Take 250 mg by mouth daily.   clopidogrel 75 MG tablet Commonly known as: PLAVIX TAKE ONE TABLET BY MOUTH DAILY   metoprolol succinate 25 MG 24 hr tablet Commonly known as: TOPROL-XL Take one tablet by mouth once daily to regulate heart and control blood pressure.   PRESERVISION AREDS 2 PO Take 1 capsule by mouth 2 (two) times daily.   Vitamin D3 50 MCG (2000 UT) Tabs Take by mouth.   Zinc Oxide 10 % Oint Apply 1 Application topically as needed.        Review of Systems  Constitutional:  Negative for activity change and appetite change.  HENT:  Positive for hearing loss. Negative for ear discharge and ear pain.   Respiratory:  Negative  for cough.   Cardiovascular:  Negative for chest pain.  Musculoskeletal:  Positive for arthralgias.  Skin:  Positive for color change.  Neurological:  Negative for numbness.  Psychiatric/Behavioral:  Negative for confusion and dysphoric mood. The patient is not nervous/anxious.     Immunization History  Administered Date(s) Administered   Fluad Quad(high Dose 65+) 02/04/2019, 03/01/2022   Influenza Whole 02/24/2011, 02/12/2012   Influenza, High Dose Seasonal PF 02/01/2017, 02/25/2018, 02/11/2020, 03/13/2023   Influenza,inj,Quad PF,6+ Mos 02/20/2013, 02/10/2014, 02/19/2015, 02/18/2016   Influenza-Unspecified 02/16/2021   Moderna Covid-19 Fall Seasonal Vaccine 48yrs & older 02/09/2022   Moderna Sars-Covid-2 Vaccination 05/19/2019, 06/16/2019, 03/29/2020, 09/27/2020   Pneumococcal Conjugate-13 06/24/2014   Pneumococcal Polysaccharide-23 04/12/1998   Td 02/21/1996, 07/23/2003   Tdap 03/20/2010   Zoster Recombinant(Shingrix) 09/19/2016, 12/02/2016   Zoster, Live 09/15/2005   Pertinent  Health Maintenance Due  Topic Date Due   INFLUENZA VACCINE  Completed   DEXA SCAN  Completed      03/08/2022    2:52 PM 07/31/2022   11:27 AM 08/23/2022    1:05 PM 09/06/2022    2:27 PM 12/27/2022    2:15 PM  Fall Risk  Falls in the past year? 0 1 1 1  0  Was there an injury with Fall? 0 1 0 1 0  Fall Risk Category Calculator 0 2 1 2  0  Fall Risk Category (Retired) Low      (RETIRED) Patient Fall Risk Level Low fall risk      Patient at Risk for Falls Due to No Fall Risks Impaired balance/gait;Impaired mobility History of fall(s) History of fall(s);Impaired balance/gait;Impaired mobility No Fall Risks  Fall risk Follow up  Falls evaluation completed;Education provided;Falls prevention discussed Falls evaluation completed Falls evaluation completed;Education provided;Falls prevention discussed Falls evaluation completed;Education provided;Falls prevention discussed   Functional Status Survey:     Vitals:   03/30/23 0959  BP: (!) 143/95  Pulse: 77  Resp: 18  Temp: (!) 97.2 F (36.2 C)  SpO2: 96%  Weight: 167 lb 14.4 oz (76.2 kg)  Height: 5\' 4"  (1.626 m)   Body mass index is 28.82 kg/m. Physical Exam Vitals reviewed.  Constitutional:      General: She is not in acute distress. HENT:     Head: Normocephalic.     Right Ear: There is impacted cerumen.     Left Ear: There is impacted cerumen.     Ears:  Comments: Reduced cerumen buildup from last encounter, unable to visualize TM due to cerumen in canal Eyes:     General:        Right eye: No discharge.        Left eye: No discharge.  Cardiovascular:     Rate and Rhythm: Normal rate and regular rhythm.     Pulses: Normal pulses.     Heart sounds: Normal heart sounds.  Pulmonary:     Effort: Pulmonary effort is normal.     Breath sounds: Normal breath sounds.  Musculoskeletal:     Cervical back: Neck supple.     Right lower leg: Edema present.     Comments: Right ankle with mild edema, no deformity, FROM  Skin:    General: Skin is warm.     Capillary Refill: Capillary refill takes less than 2 seconds.     Findings: Erythema present.  Neurological:     General: No focal deficit present.     Mental Status: She is alert and oriented to person, place, and time.     Motor: Weakness present.     Gait: Gait abnormal.     Comments: wheelchair  Psychiatric:        Mood and Affect: Mood normal.     Labs reviewed: Recent Labs    07/26/22 1715 10/31/22 0000 03/05/23 0000  NA 139 141 140  K 4.2 3.7 4.2  CL 102 104 105  CO2 27 24* 27*  GLUCOSE 100*  --   --   BUN 18 29* 24*  CREATININE 0.74 0.8 0.6  CALCIUM 9.8 9.5 8.8   Recent Labs    10/31/22 0000 03/05/23 0000  AST 21 14  ALT 18 16  ALKPHOS 71 71  ALBUMIN 4.1 3.4*   Recent Labs    07/26/22 1715 10/31/22 0000 03/05/23 0000  WBC 7.0 10.3 7.4  NEUTROABS 4.1  --  4,151.00  HGB 13.6 13.8 12.2  HCT 41.0 42 37  MCV 92.6  --   --   PLT 207  234 219   Lab Results  Component Value Date   TSH 3.20 10/31/2022   Lab Results  Component Value Date   HGBA1C 5.3 05/02/2013   Lab Results  Component Value Date   CHOL 188 07/10/2019   HDL 75 07/10/2019   LDLCALC 94 07/10/2019   TRIG 96 07/10/2019   CHOLHDL 2.5 07/10/2019    Significant Diagnostic Results in last 30 days:  No results found.  Assessment/Plan 1. Cellulitis of right foot - 11/14 increased right ankle/foot pain - erythema/swelling and warmth to medial ankle - pain with rest and movement - will try antibiotics> if no improvement> consider prednisone taper - doxycycline (VIBRA-TABS) 100 MG tablet; Take 1 tablet (100 mg total) by mouth 2 (two) times daily for 10 days.  2. Bilateral impacted cerumen - ongoing - completed debrox x 5 days and ear lavage - still has wax buildup but improved - repeat debrox x 5 days and ear lavage - consult [facility] in house provider for hearing eval - carbamide peroxide (DEBROX) 6.5 % OTIC solution; Place 5 drops into both ears 2 (two) times daily for 5 days.    Family/ staff Communication: plan discussed with patient and nurse  Labs/tests ordered: none

## 2023-04-02 DIAGNOSIS — R2681 Unsteadiness on feet: Secondary | ICD-10-CM | POA: Diagnosis not present

## 2023-04-02 DIAGNOSIS — Z9181 History of falling: Secondary | ICD-10-CM | POA: Diagnosis not present

## 2023-04-02 DIAGNOSIS — R41841 Cognitive communication deficit: Secondary | ICD-10-CM | POA: Diagnosis not present

## 2023-04-02 DIAGNOSIS — R278 Other lack of coordination: Secondary | ICD-10-CM | POA: Diagnosis not present

## 2023-04-02 DIAGNOSIS — R4701 Aphasia: Secondary | ICD-10-CM | POA: Diagnosis not present

## 2023-04-02 DIAGNOSIS — M6281 Muscle weakness (generalized): Secondary | ICD-10-CM | POA: Diagnosis not present

## 2023-04-03 DIAGNOSIS — Z9181 History of falling: Secondary | ICD-10-CM | POA: Diagnosis not present

## 2023-04-03 DIAGNOSIS — M6281 Muscle weakness (generalized): Secondary | ICD-10-CM | POA: Diagnosis not present

## 2023-04-03 DIAGNOSIS — R41841 Cognitive communication deficit: Secondary | ICD-10-CM | POA: Diagnosis not present

## 2023-04-03 DIAGNOSIS — R4701 Aphasia: Secondary | ICD-10-CM | POA: Diagnosis not present

## 2023-04-03 DIAGNOSIS — R278 Other lack of coordination: Secondary | ICD-10-CM | POA: Diagnosis not present

## 2023-04-03 DIAGNOSIS — R2681 Unsteadiness on feet: Secondary | ICD-10-CM | POA: Diagnosis not present

## 2023-04-04 DIAGNOSIS — R2681 Unsteadiness on feet: Secondary | ICD-10-CM | POA: Diagnosis not present

## 2023-04-04 DIAGNOSIS — R278 Other lack of coordination: Secondary | ICD-10-CM | POA: Diagnosis not present

## 2023-04-04 DIAGNOSIS — M6281 Muscle weakness (generalized): Secondary | ICD-10-CM | POA: Diagnosis not present

## 2023-04-04 DIAGNOSIS — R4701 Aphasia: Secondary | ICD-10-CM | POA: Diagnosis not present

## 2023-04-04 DIAGNOSIS — R41841 Cognitive communication deficit: Secondary | ICD-10-CM | POA: Diagnosis not present

## 2023-04-04 DIAGNOSIS — Z9181 History of falling: Secondary | ICD-10-CM | POA: Diagnosis not present

## 2023-04-06 DIAGNOSIS — R2681 Unsteadiness on feet: Secondary | ICD-10-CM | POA: Diagnosis not present

## 2023-04-06 DIAGNOSIS — R278 Other lack of coordination: Secondary | ICD-10-CM | POA: Diagnosis not present

## 2023-04-06 DIAGNOSIS — Z9181 History of falling: Secondary | ICD-10-CM | POA: Diagnosis not present

## 2023-04-06 DIAGNOSIS — M6281 Muscle weakness (generalized): Secondary | ICD-10-CM | POA: Diagnosis not present

## 2023-04-06 DIAGNOSIS — R4701 Aphasia: Secondary | ICD-10-CM | POA: Diagnosis not present

## 2023-04-06 DIAGNOSIS — R41841 Cognitive communication deficit: Secondary | ICD-10-CM | POA: Diagnosis not present

## 2023-04-09 DIAGNOSIS — R41841 Cognitive communication deficit: Secondary | ICD-10-CM | POA: Diagnosis not present

## 2023-04-09 DIAGNOSIS — R2681 Unsteadiness on feet: Secondary | ICD-10-CM | POA: Diagnosis not present

## 2023-04-09 DIAGNOSIS — Z9181 History of falling: Secondary | ICD-10-CM | POA: Diagnosis not present

## 2023-04-09 DIAGNOSIS — R4701 Aphasia: Secondary | ICD-10-CM | POA: Diagnosis not present

## 2023-04-09 DIAGNOSIS — R278 Other lack of coordination: Secondary | ICD-10-CM | POA: Diagnosis not present

## 2023-04-09 DIAGNOSIS — M6281 Muscle weakness (generalized): Secondary | ICD-10-CM | POA: Diagnosis not present

## 2023-04-10 DIAGNOSIS — M6281 Muscle weakness (generalized): Secondary | ICD-10-CM | POA: Diagnosis not present

## 2023-04-10 DIAGNOSIS — R4701 Aphasia: Secondary | ICD-10-CM | POA: Diagnosis not present

## 2023-04-10 DIAGNOSIS — R278 Other lack of coordination: Secondary | ICD-10-CM | POA: Diagnosis not present

## 2023-04-10 DIAGNOSIS — R2681 Unsteadiness on feet: Secondary | ICD-10-CM | POA: Diagnosis not present

## 2023-04-10 DIAGNOSIS — R41841 Cognitive communication deficit: Secondary | ICD-10-CM | POA: Diagnosis not present

## 2023-04-10 DIAGNOSIS — Z9181 History of falling: Secondary | ICD-10-CM | POA: Diagnosis not present

## 2023-04-11 DIAGNOSIS — M6281 Muscle weakness (generalized): Secondary | ICD-10-CM | POA: Diagnosis not present

## 2023-04-11 DIAGNOSIS — R2681 Unsteadiness on feet: Secondary | ICD-10-CM | POA: Diagnosis not present

## 2023-04-11 DIAGNOSIS — R41841 Cognitive communication deficit: Secondary | ICD-10-CM | POA: Diagnosis not present

## 2023-04-11 DIAGNOSIS — R278 Other lack of coordination: Secondary | ICD-10-CM | POA: Diagnosis not present

## 2023-04-11 DIAGNOSIS — Z9181 History of falling: Secondary | ICD-10-CM | POA: Diagnosis not present

## 2023-04-11 DIAGNOSIS — R4701 Aphasia: Secondary | ICD-10-CM | POA: Diagnosis not present

## 2023-04-13 DIAGNOSIS — R2681 Unsteadiness on feet: Secondary | ICD-10-CM | POA: Diagnosis not present

## 2023-04-13 DIAGNOSIS — R4701 Aphasia: Secondary | ICD-10-CM | POA: Diagnosis not present

## 2023-04-13 DIAGNOSIS — M6281 Muscle weakness (generalized): Secondary | ICD-10-CM | POA: Diagnosis not present

## 2023-04-13 DIAGNOSIS — R41841 Cognitive communication deficit: Secondary | ICD-10-CM | POA: Diagnosis not present

## 2023-04-13 DIAGNOSIS — Z9181 History of falling: Secondary | ICD-10-CM | POA: Diagnosis not present

## 2023-04-13 DIAGNOSIS — R278 Other lack of coordination: Secondary | ICD-10-CM | POA: Diagnosis not present

## 2023-04-16 DIAGNOSIS — Z9181 History of falling: Secondary | ICD-10-CM | POA: Diagnosis not present

## 2023-04-16 DIAGNOSIS — R278 Other lack of coordination: Secondary | ICD-10-CM | POA: Diagnosis not present

## 2023-04-16 DIAGNOSIS — R4701 Aphasia: Secondary | ICD-10-CM | POA: Diagnosis not present

## 2023-04-16 DIAGNOSIS — R41841 Cognitive communication deficit: Secondary | ICD-10-CM | POA: Diagnosis not present

## 2023-04-16 DIAGNOSIS — R2681 Unsteadiness on feet: Secondary | ICD-10-CM | POA: Diagnosis not present

## 2023-04-16 DIAGNOSIS — M6281 Muscle weakness (generalized): Secondary | ICD-10-CM | POA: Diagnosis not present

## 2023-04-18 ENCOUNTER — Encounter: Payer: Self-pay | Admitting: Orthopedic Surgery

## 2023-04-18 ENCOUNTER — Non-Acute Institutional Stay (SKILLED_NURSING_FACILITY): Payer: Medicare Other | Admitting: Orthopedic Surgery

## 2023-04-18 DIAGNOSIS — R4701 Aphasia: Secondary | ICD-10-CM | POA: Diagnosis not present

## 2023-04-18 DIAGNOSIS — I10 Essential (primary) hypertension: Secondary | ICD-10-CM

## 2023-04-18 DIAGNOSIS — R2681 Unsteadiness on feet: Secondary | ICD-10-CM | POA: Diagnosis not present

## 2023-04-18 DIAGNOSIS — R41841 Cognitive communication deficit: Secondary | ICD-10-CM | POA: Diagnosis not present

## 2023-04-18 DIAGNOSIS — M25571 Pain in right ankle and joints of right foot: Secondary | ICD-10-CM | POA: Diagnosis not present

## 2023-04-18 DIAGNOSIS — M6281 Muscle weakness (generalized): Secondary | ICD-10-CM | POA: Diagnosis not present

## 2023-04-18 DIAGNOSIS — L03115 Cellulitis of right lower limb: Secondary | ICD-10-CM | POA: Diagnosis not present

## 2023-04-18 DIAGNOSIS — Z9181 History of falling: Secondary | ICD-10-CM | POA: Diagnosis not present

## 2023-04-18 DIAGNOSIS — R278 Other lack of coordination: Secondary | ICD-10-CM | POA: Diagnosis not present

## 2023-04-18 MED ORDER — ACETAMINOPHEN 500 MG PO TABS: 1000.0000 mg | ORAL_TABLET | Freq: Two times a day (BID) | ORAL | Status: AC

## 2023-04-18 NOTE — Progress Notes (Signed)
Location:  [facility] Room Number: 14/A [name]:  SNF [phone]) Provider:  Octavia Heir, NP   Mahlon Gammon, MD  Patient Care Team: Mahlon Gammon, MD as PCP - General (Internal Medicine) Runell Gess, MD as PCP - Cardiology (Cardiology) Carman Ching, MD (Inactive) as Consulting Physician (Gastroenterology) Esaw Dace, MD as Attending Physician (Urology) Runell Gess, MD as Consulting Physician (Cardiology) Drema Halon, MD (Inactive) as Consulting Physician (Otolaryngology) Elizebeth Koller, DDS (Dentistry) Mckinley Jewel, MD as Consulting Physician (Ophthalmology) Bradly Bienenstock, MD as Consulting Physician (Orthopedic Surgery) Gaynelle Adu, MD as Consulting Physician (General Surgery)  Extended Emergency Contact Information Primary Emergency Contact: Sheran Luz Address: [address]          [place], [place] [address] D[place] of [place] Home Phone: [phone] Work Phone: [phone] Mobile Phone: [phone] Relation: Daughter Secondary Emergency Contact: Lia Hopping Address: [address]          [place], [place] [address] D[place] of [place] Home Phone: [phone] Mobile Phone: [phone] Relation: Son  Code Status:  DNR Goals of care: Advanced Directive information    03/30/2023   10:02 AM  Advanced Directives  Does Patient Have a Medical Advance Directive? Yes  Type of Estate agent of [place];Living will;Out of facility DNR (pink MOST or yellow form)  Does patient want to make changes to medical advance directive? No - Patient declined  Copy of Healthcare Power of Attorney in Chart? Yes - validated most recent copy scanned in chart (See row information)     Chief Complaint  Patient presents with   Acute Visit    Right foot pain    HPI:  Pt is a 89 y.o. female seen today for acute visit due to right foot pain.   She currently resides on the assisted  living unit at [facility]. PMH: brain aneurysm, venous insufficiency, HTN, TIA, GERD, hypothyroidism, OAB, senile osteoporosis, macular degeneration, OA, and unstable gait.    11/15 prescribed doxycycline x 10 days due to cellulitis of right foot. Skin to right ankle has healed. Today she was transferring from commode to wheelchair when she experienced shooting pain from right arch up towards knee. No recent fall or injury. She is wearing very tight compression stockings. Pain improved once I removed compression stockings. Afebrile. Vitals stable.    Past Medical History:  Diagnosis Date   Cervicalgia    Chest pain at rest 01/07/2012   Displacement of cervical intervertebral disc without myelopathy    Diverticulosis of colon (without mention of hemorrhage)    Female stress incontinence    GERD (gastroesophageal reflux disease)    no peds occasional pepcid   Inguinal hernia without mention of obstruction or gangrene, unilateral or unspecified, (not specified as recurrent)    Internal hemorrhoids without mention of complication    Lumbago    Macular degeneration (senile) of retina, unspecified    Osteoarthrosis, unspecified whether generalized or localized, unspecified site    Other and unspecified hyperlipidemia    Pain in joint, hand    Palpitations    Reflux esophagitis    Scoliosis (and kyphoscoliosis), idiopathic    Senile osteoporosis    Skin disorder    Thyroid disease    TIA (transient ischemic attack)    Unruptured popliteal cyst 06/24/2014   Right knee    Unspecified essential hypertension    Unspecified glaucoma(365.9)    Unspecified hypothyroidism    Unspecified vitamin D  deficiency    Past Surgical History:  Procedure Laterality Date   ABDOMINAL HYSTERECTOMY  1975   Dr Dewaine Conger   APPENDECTOMY  1988   cardiolite myocardial perfusion study     DOPPLER ECHOCARDIOGRAPHY     EYE SURGERY Bilateral 2009   cataract removed right eye, Dr Emmit Pomfret   FOOT SURGERY  august  2013   Hewitt, MD   INGUINAL HERNIA REPAIR Bilateral    w/mesh   INGUINAL HERNIA REPAIR Left 08/03/2017   Procedure: LAPAROSCOPIC LEFT INGUINAL HERNIA REPAIR WITH MESH;  Surgeon: Gaynelle Adu, MD;  Location: [facility] OR;  Service: General;  Laterality: Left;   INGUINAL HERNIA REPAIR Right 08/03/2017   Procedure: HERNIA REPAIR RIGHT INGUINAL ADULT WITH MESH;  Surgeon: Gaynelle Adu, MD;  Location: [facility] OR;  Service: General;  Laterality: Right;   INGUINAL HERNIA REPAIR     Dr. Andrey Campanile 04-24-18   INGUINAL HERNIA REPAIR Right 04/24/2018   Procedure: DIAGNOSTIC LAPAROSCOPIC, OPENREPAIR OF RECURRENT RIGHT INGUINAL HERNIA WITH MESH ERAS PATHWAY;  Surgeon: Gaynelle Adu, MD;  Location: [facility] ORS;  Service: General;  Laterality: Right;   INSERTION OF MESH Bilateral 08/03/2017   Procedure: INSERTION OF MESH;  Surgeon: Gaynelle Adu, MD;  Location: [facility] OR;  Service: General;  Laterality: Bilateral;   NM MYOVIEW LTD     negative   TONSILLECTOMY  1937   TOOTH EXTRACTION  09/16/13   Dr Randel Books    Allergies  Allergen Reactions   Trimethoprim Other (See Comments)    Headaches/ ear pressure    Erythromycin Other (See Comments)    UNSPECIFIED REACTION    Macrodantin [Nitrofurantoin Macrocrystal] Other (See Comments)    UNSPECIFIED REACTION    Other     Honeydew Melon   Penicillins Other (See Comments)    UNSPECIFIED REACTION  Has patient had a PCN reaction causing immediate rash, facial/tongue/throat swelling, SOB or lightheadedness with hypotension: Unknown Has patient had a PCN reaction causing severe rash involving mucus membranes or skin necrosis: Unknown Has patient had a PCN reaction that required hospitalization: No Has patient had a PCN reaction occurring within the last 10 years: No If all of the above answers are "NO", then may proceed with Cephalosporin use.   Sulfa Antibiotics Other (See Comments)    UNSPECIFIED REACTION    Latex Itching    Outpatient Encounter Medications as of 04/18/2023   Medication Sig   acetaminophen (TYLENOL) 500 MG tablet Take 500 mg by mouth 2 (two) times daily as needed for mild pain or headache.    cephALEXin (KEFLEX) 250 MG capsule Take 250 mg by mouth daily.   Cholecalciferol (VITAMIN D3) 50 MCG (2000 UT) TABS Take by mouth.   clopidogrel (PLAVIX) 75 MG tablet TAKE ONE TABLET BY MOUTH DAILY   Cranberry-Vitamin C-Probiotic (AZO CRANBERRY PO) Take 1 tablet by mouth daily.   metoprolol succinate (TOPROL-XL) 25 MG 24 hr tablet Take one tablet by mouth once daily to regulate heart and control blood pressure.   Multiple Vitamins-Minerals (PRESERVISION AREDS 2 PO) Take 1 capsule by mouth 2 (two) times daily.    vitamin C (ASCORBIC ACID) 500 MG tablet Take 500 mg by mouth daily with supper.    Zinc Oxide 10 % OINT Apply 1 Application topically as needed.   No facility-administered encounter medications on file as of 04/18/2023.    Review of Systems  Constitutional:  Negative for activity change and appetite change.  Respiratory:  Negative for shortness of breath.   Cardiovascular:  Negative for chest  pain.  Musculoskeletal:  Positive for arthralgias and gait problem.  Psychiatric/Behavioral:  Negative for dysphoric mood. The patient is not nervous/anxious.     Immunization History  Administered Date(s) Administered   Fluad Quad(high Dose 65+) 02/04/2019, 03/01/2022   Influenza Whole 02/24/2011, 02/12/2012   Influenza, High Dose Seasonal PF 02/01/2017, 02/25/2018, 02/11/2020, 03/13/2023   Influenza,inj,Quad PF,6+ Mos 02/20/2013, 02/10/2014, 02/19/2015, 02/18/2016   Influenza-Unspecified 02/16/2021   Moderna Covid-19 Fall Seasonal Vaccine 35yrs & older 02/09/2022   Moderna Sars-Covid-2 Vaccination 05/19/2019, 06/16/2019, 03/29/2020, 09/27/2020   Pneumococcal Conjugate-13 06/24/2014   Pneumococcal Polysaccharide-23 04/12/1998   Td 02/21/1996, 07/23/2003   Tdap 03/20/2010   Zoster Recombinant(Shingrix) 09/19/2016, 12/02/2016   Zoster, Live 09/15/2005    Pertinent  Health Maintenance Due  Topic Date Due   INFLUENZA VACCINE  Completed   DEXA SCAN  Completed      03/08/2022    2:52 PM 07/31/2022   11:27 AM 08/23/2022    1:05 PM 09/06/2022    2:27 PM 12/27/2022    2:15 PM  Fall Risk  Falls in the past year? 0 1 1 1  0  Was there an injury with Fall? 0 1 0 1 0  Fall Risk Category Calculator 0 2 1 2  0  Fall Risk Category (Retired) Low      (RETIRED) Patient Fall Risk Level Low fall risk      Patient at Risk for Falls Due to No Fall Risks Impaired balance/gait;Impaired mobility History of fall(s) History of fall(s);Impaired balance/gait;Impaired mobility No Fall Risks  Fall risk Follow up  Falls evaluation completed;Education provided;Falls prevention discussed Falls evaluation completed Falls evaluation completed;Education provided;Falls prevention discussed Falls evaluation completed;Education provided;Falls prevention discussed   Functional Status Survey:    Vitals:   04/18/23 1633  BP: (!) 158/86  Pulse: 69  Resp: 18  Temp: (!) 97.1 F (36.2 C)  SpO2: 94%  Weight: 166 lb 1.6 oz (75.3 kg)  Height: 5\' 4"  (1.626 m)   Body mass index is 28.51 kg/m. Physical Exam Vitals reviewed.  Constitutional:      General: She is not in acute distress. HENT:     Head: Normocephalic.  Eyes:     General:        Right eye: No discharge.        Left eye: No discharge.  Cardiovascular:     Rate and Rhythm: Normal rate and regular rhythm.     Pulses: Normal pulses.     Heart sounds: Normal heart sounds.  Pulmonary:     Effort: Pulmonary effort is normal.     Breath sounds: Normal breath sounds.  Musculoskeletal:     Right lower leg: No edema.     Left lower leg: No edema.     Right ankle: No swelling or deformity. Tenderness present over the posterior TF ligament. Normal range of motion. Normal pulse.  Skin:    General: Skin is warm.     Capillary Refill: Capillary refill takes less than 2 seconds.  Neurological:     General: No  focal deficit present.     Mental Status: She is alert and oriented to person, place, and time.     Motor: Weakness present.     Gait: Gait abnormal.  Psychiatric:        Mood and Affect: Mood normal.     Labs reviewed: Recent Labs    07/26/22 1715 10/31/22 0000 03/05/23 0000  NA 139 141 140  K 4.2 3.7 4.2  CL 102 104  105  CO2 27 24* 27*  GLUCOSE 100*  --   --   BUN 18 29* 24*  CREATININE 0.74 0.8 0.6  CALCIUM 9.8 9.5 8.8   Recent Labs    10/31/22 0000 03/05/23 0000  AST 21 14  ALT 18 16  ALKPHOS 71 71  ALBUMIN 4.1 3.4*   Recent Labs    07/26/22 1715 10/31/22 0000 03/05/23 0000  WBC 7.0 10.3 7.4  NEUTROABS 4.1  --  4,151.00  HGB 13.6 13.8 12.2  HCT 41.0 42 37  MCV 92.6  --   --   PLT 207 234 219   Lab Results  Component Value Date   TSH 3.20 10/31/2022   Lab Results  Component Value Date   HGBA1C 5.3 05/02/2013   Lab Results  Component Value Date   CHOL 188 07/10/2019   HDL 75 07/10/2019   LDLCALC 94 07/10/2019   TRIG 96 07/10/2019   CHOLHDL 2.5 07/10/2019    Significant Diagnostic Results in last 30 days:  No results found.  Assessment/Plan 1. Acute right ankle pain - started today with transfer - suspect mild tibial tendonitis> flat foot  - pain relieved with compression stocking off - will hold compression socks x 1 week - start scheduled tylenol x 2 weeks  - acetaminophen (TYLENOL) 500 MG tablet; Take 2 tablets (1,000 mg total) by mouth 2 (two) times daily for 14 days.  2. Cellulitis of right foot - resolved with doxycycline x 10 days  3. Essential hypertension - controlled with metoprolol   Family/ staff Communication: plan discussed with patient and nurse  Labs/tests ordered:  none

## 2023-04-19 DIAGNOSIS — M6281 Muscle weakness (generalized): Secondary | ICD-10-CM | POA: Diagnosis not present

## 2023-04-19 DIAGNOSIS — R2681 Unsteadiness on feet: Secondary | ICD-10-CM | POA: Diagnosis not present

## 2023-04-19 DIAGNOSIS — R4701 Aphasia: Secondary | ICD-10-CM | POA: Diagnosis not present

## 2023-04-19 DIAGNOSIS — R278 Other lack of coordination: Secondary | ICD-10-CM | POA: Diagnosis not present

## 2023-04-19 DIAGNOSIS — Z9181 History of falling: Secondary | ICD-10-CM | POA: Diagnosis not present

## 2023-04-19 DIAGNOSIS — R41841 Cognitive communication deficit: Secondary | ICD-10-CM | POA: Diagnosis not present

## 2023-04-20 DIAGNOSIS — R41841 Cognitive communication deficit: Secondary | ICD-10-CM | POA: Diagnosis not present

## 2023-04-20 DIAGNOSIS — Z9181 History of falling: Secondary | ICD-10-CM | POA: Diagnosis not present

## 2023-04-20 DIAGNOSIS — R4701 Aphasia: Secondary | ICD-10-CM | POA: Diagnosis not present

## 2023-04-20 DIAGNOSIS — R2681 Unsteadiness on feet: Secondary | ICD-10-CM | POA: Diagnosis not present

## 2023-04-20 DIAGNOSIS — M6281 Muscle weakness (generalized): Secondary | ICD-10-CM | POA: Diagnosis not present

## 2023-04-20 DIAGNOSIS — R278 Other lack of coordination: Secondary | ICD-10-CM | POA: Diagnosis not present

## 2023-04-23 ENCOUNTER — Encounter: Payer: Self-pay | Admitting: Orthopedic Surgery

## 2023-04-23 DIAGNOSIS — R2681 Unsteadiness on feet: Secondary | ICD-10-CM | POA: Diagnosis not present

## 2023-04-23 DIAGNOSIS — R278 Other lack of coordination: Secondary | ICD-10-CM | POA: Diagnosis not present

## 2023-04-23 DIAGNOSIS — Z9181 History of falling: Secondary | ICD-10-CM | POA: Diagnosis not present

## 2023-04-23 DIAGNOSIS — M6281 Muscle weakness (generalized): Secondary | ICD-10-CM | POA: Diagnosis not present

## 2023-04-23 DIAGNOSIS — R4701 Aphasia: Secondary | ICD-10-CM | POA: Diagnosis not present

## 2023-04-23 DIAGNOSIS — R41841 Cognitive communication deficit: Secondary | ICD-10-CM | POA: Diagnosis not present

## 2023-04-23 NOTE — Progress Notes (Signed)
Location:   [facility] Room Number: 14-A [name]:  SNF [phone]) Provider:  Hazle Nordmann, NP  PCP: Mahlon Gammon, MD  Patient Care Team: Mahlon Gammon, MD as PCP - General (Internal Medicine) Runell Gess, MD as PCP - Cardiology (Cardiology) Carman Ching, MD (Inactive) as Consulting Physician (Gastroenterology) Esaw Dace, MD as Attending Physician (Urology) Runell Gess, MD as Consulting Physician (Cardiology) Drema Halon, MD (Inactive) as Consulting Physician (Otolaryngology) Elizebeth Koller, DDS (Dentistry) Mckinley Jewel, MD as Consulting Physician (Ophthalmology) Bradly Bienenstock, MD as Consulting Physician (Orthopedic Surgery) Gaynelle Adu, MD as Consulting Physician (General Surgery)  Extended Emergency Contact Information Primary Emergency Contact: Sheran Luz Address: [address]          [place], [place] [address] D[place] of [place] Home Phone: [phone] Work Phone: [phone] Mobile Phone: [phone] Relation: Daughter Secondary Emergency Contact: Lia Hopping Address: [address]          [place], [place] [address] [place] of [place] Home Phone: [phone] Mobile Phone: [phone] Relation: Son  Code Status:  DNR Goals of care: Advanced Directive information    04/23/2023   10:41 AM  Advanced Directives  Does Patient Have a Medical Advance Directive? Yes  Type of Estate agent of [place];Living will;Out of facility DNR (pink MOST or yellow form)  Does patient want to make changes to medical advance directive? No - Patient declined  Copy of Healthcare Power of Attorney in Chart? Yes - validated most recent copy scanned in chart (See row information)     Chief Complaint  Patient presents with   Medical Management of Chronic Issues    Routine visit.    Immunizations    Discuss the need for DTAP vaccine.     HPI:  Pt is a 89 y.o. female seen today  for medical management of chronic diseases.     Past Medical History:  Diagnosis Date   Cervicalgia    Chest pain at rest 01/07/2012   Displacement of cervical intervertebral disc without myelopathy    Diverticulosis of colon (without mention of hemorrhage)    Female stress incontinence    GERD (gastroesophageal reflux disease)    no peds occasional pepcid   Inguinal hernia without mention of obstruction or gangrene, unilateral or unspecified, (not specified as recurrent)    Internal hemorrhoids without mention of complication    Lumbago    Macular degeneration (senile) of retina, unspecified    Osteoarthrosis, unspecified whether generalized or localized, unspecified site    Other and unspecified hyperlipidemia    Pain in joint, hand    Palpitations    Reflux esophagitis    Scoliosis (and kyphoscoliosis), idiopathic    Senile osteoporosis    Skin disorder    Thyroid disease    TIA (transient ischemic attack)    Unruptured popliteal cyst 06/24/2014   Right knee    Unspecified essential hypertension    Unspecified glaucoma(365.9)    Unspecified hypothyroidism    Unspecified vitamin D deficiency    Past Surgical History:  Procedure Laterality Date   ABDOMINAL HYSTERECTOMY  1975   Dr Dewaine Conger   APPENDECTOMY  1988   cardiolite myocardial perfusion study     DOPPLER ECHOCARDIOGRAPHY     EYE SURGERY Bilateral 2009   cataract removed right eye, Dr Emmit Pomfret   FOOT SURGERY  august 2013   Hewitt, MD   INGUINAL HERNIA REPAIR Bilateral    w/mesh   INGUINAL  HERNIA REPAIR Left 08/03/2017   Procedure: LAPAROSCOPIC LEFT INGUINAL HERNIA REPAIR WITH MESH;  Surgeon: Gaynelle Adu, MD;  Location: [facility] OR;  Service: General;  Laterality: Left;   INGUINAL HERNIA REPAIR Right 08/03/2017   Procedure: HERNIA REPAIR RIGHT INGUINAL ADULT WITH MESH;  Surgeon: Gaynelle Adu, MD;  Location: [facility] OR;  Service: General;  Laterality: Right;   INGUINAL HERNIA REPAIR     Dr. Andrey Campanile 04-24-18   INGUINAL HERNIA REPAIR  Right 04/24/2018   Procedure: DIAGNOSTIC LAPAROSCOPIC, OPENREPAIR OF RECURRENT RIGHT INGUINAL HERNIA WITH MESH ERAS PATHWAY;  Surgeon: Gaynelle Adu, MD;  Location: [facility] ORS;  Service: General;  Laterality: Right;   INSERTION OF MESH Bilateral 08/03/2017   Procedure: INSERTION OF MESH;  Surgeon: Gaynelle Adu, MD;  Location[facility] OR;  Service: General;  Laterality: Bilateral;   NM MYOVIEW LTD     negative   TONSILLECTOMY  1937   TOOTH EXTRACTION  09/16/13   Dr Randel Books    Allergies  Allergen Reactions   Trimethoprim Other (See Comments)    Headaches/ ear pressure    Erythromycin Other (See Comments)    UNSPECIFIED REACTION    Macrodantin [Nitrofurantoin Macrocrystal] Other (See Comments)    UNSPECIFIED REACTION    Other     Honeydew Melon   Penicillins Other (See Comments)    UNSPECIFIED REACTION  Has patient had a PCN reaction causing immediate rash, facial/tongue/throat swelling, SOB or lightheadedness with hypotension: Unknown Has patient had a PCN reaction causing severe rash involving mucus membranes or skin necrosis: Unknown Has patient had a PCN reaction that required hospitalization: No Has patient had a PCN reaction occurring within the last 10 years: No If all of the above answers are "NO", then may proceed with Cephalosporin use.   Sulfa Antibiotics Other (See Comments)    UNSPECIFIED REACTION    Latex Itching    Allergies as of 04/23/2023       Reactions   Trimethoprim Other (See Comments)   Headaches/ ear pressure    Erythromycin Other (See Comments)   UNSPECIFIED REACTION    Macrodantin [nitrofurantoin Macrocrystal] Other (See Comments)   UNSPECIFIED REACTION    Other    Honeydew Melon   Penicillins Other (See Comments)   UNSPECIFIED REACTION  Has patient had a PCN reaction causing immediate rash, facial/tongue/throat swelling, SOB or lightheadedness with hypotension: Unknown Has patient had a PCN reaction causing severe rash involving mucus membranes or skin  necrosis: Unknown Has patient had a PCN reaction that required hospitalization: No Has patient had a PCN reaction occurring within the last 10 years: No If all of the above answers are "NO", then may proceed with Cephalosporin use.   Sulfa Antibiotics Other (See Comments)   UNSPECIFIED REACTION    Latex Itching        Medication List        Accurate as of April 23, 2023 10:43 AM. If you have any questions, ask your nurse or doctor.          acetaminophen 500 MG tablet Commonly known as: TYLENOL Take 500 mg by mouth 2 (two) times daily as needed for mild pain or headache.   acetaminophen 500 MG tablet Commonly known as: TYLENOL Take 2 tablets (1,000 mg total) by mouth 2 (two) times daily for 14 days.   ascorbic acid 500 MG tablet Commonly known as: VITAMIN C Take 500 mg by mouth daily with supper.   AZO CRANBERRY PO Take 1 tablet by mouth daily.   cephALEXin  250 MG capsule Commonly known as: KEFLEX Take 250 mg by mouth daily.   clopidogrel 75 MG tablet Commonly known as: PLAVIX TAKE ONE TABLET BY MOUTH DAILY   metoprolol succinate 25 MG 24 hr tablet Commonly known as: TOPROL-XL Take one tablet by mouth once daily to regulate heart and control blood pressure.   PRESERVISION AREDS 2 PO Take 1 capsule by mouth 2 (two) times daily.   Vitamin D3 50 MCG (2000 UT) Tabs Take by mouth.   Zinc Oxide 10 % Oint Apply 1 Application topically as needed.        Review of Systems  Immunization History  Administered Date(s) Administered   Fluad Quad(high Dose 65+) 02/04/2019, 03/01/2022   Influenza Whole 02/24/2011, 02/12/2012   Influenza, High Dose Seasonal PF 02/01/2017, 02/25/2018, 02/11/2020, 03/13/2023   Influenza,inj,Quad PF,6+ Mos 02/20/2013, 02/10/2014, 02/19/2015, 02/18/2016   Influenza-Unspecified 02/16/2021   Moderna Covid-19 Fall Seasonal Vaccine 22yrs & older 02/09/2022, 03/21/2023   Moderna Sars-Covid-2 Vaccination 05/19/2019, 06/16/2019,  03/29/2020, 09/27/2020   Pneumococcal Conjugate-13 06/24/2014   Pneumococcal Polysaccharide-23 04/12/1998   Td 02/21/1996, 07/23/2003   Tdap 03/20/2010   Zoster Recombinant(Shingrix) 09/19/2016, 12/02/2016   Zoster, Live 09/15/2005   Pertinent  Health Maintenance Due  Topic Date Due   INFLUENZA VACCINE  Completed   DEXA SCAN  Completed      07/31/2022   11:27 AM 08/23/2022    1:05 PM 09/06/2022    2:27 PM 12/27/2022    2:15 PM 04/18/2023    4:49 PM  Fall Risk  Falls in the past year? 1 1 1  0 0  Was there an injury with Fall? 1 0 1 0 0  Fall Risk Category Calculator 2 1 2  0 0  Patient at Risk for Falls Due to Impaired balance/gait;Impaired mobility History of fall(s) History of fall(s);Impaired balance/gait;Impaired mobility No Fall Risks History of fall(s);Impaired balance/gait;Impaired mobility  Fall risk Follow up Falls evaluation completed;Education provided;Falls prevention discussed Falls evaluation completed Falls evaluation completed;Education provided;Falls prevention discussed Falls evaluation completed;Education provided;Falls prevention discussed Falls evaluation completed;Education provided;Falls prevention discussed   Functional Status Survey:    Vitals:   04/23/23 1040  BP: (!) 158/86  Pulse: 69  Resp: 18  Temp: (!) 97.1 F (36.2 C)  SpO2: 94%  Weight: 166 lb 1.6 oz (75.3 kg)  Height: 5\' 4"  (1.626 m)   Body mass index is 28.51 kg/m. Physical Exam  Labs reviewed: Recent Labs    07/26/22 1715 10/31/22 0000 03/05/23 0000  NA 139 141 140  K 4.2 3.7 4.2  CL 102 104 105  CO2 27 24* 27*  GLUCOSE 100*  --   --   BUN 18 29* 24*  CREATININE 0.74 0.8 0.6  CALCIUM 9.8 9.5 8.8   Recent Labs    10/31/22 0000 03/05/23 0000  AST 21 14  ALT 18 16  ALKPHOS 71 71  ALBUMIN 4.1 3.4*   Recent Labs    07/26/22 1715 10/31/22 0000 03/05/23 0000  WBC 7.0 10.3 7.4  NEUTROABS 4.1  --  4,151.00  HGB 13.6 13.8 12.2  HCT 41.0 42 37  MCV 92.6  --   --   PLT 207  234 219   Lab Results  Component Value Date   TSH 3.20 10/31/2022   Lab Results  Component Value Date   HGBA1C 5.3 05/02/2013   Lab Results  Component Value Date   CHOL 188 07/10/2019   HDL 75 07/10/2019   LDLCALC 94 07/10/2019   TRIG 96  07/10/2019   CHOLHDL 2.5 07/10/2019    Significant Diagnostic Results in last 30 days:  No results found.  Assessment/Plan There are no diagnoses linked to this encounter.   Family/ staff Communication:   Labs/tests ordered:

## 2023-04-23 NOTE — Progress Notes (Signed)
This encounter was created in error - please disregard.

## 2023-04-24 DIAGNOSIS — R41841 Cognitive communication deficit: Secondary | ICD-10-CM | POA: Diagnosis not present

## 2023-04-24 DIAGNOSIS — Z9181 History of falling: Secondary | ICD-10-CM | POA: Diagnosis not present

## 2023-04-24 DIAGNOSIS — R4701 Aphasia: Secondary | ICD-10-CM | POA: Diagnosis not present

## 2023-04-24 DIAGNOSIS — R278 Other lack of coordination: Secondary | ICD-10-CM | POA: Diagnosis not present

## 2023-04-24 DIAGNOSIS — R2681 Unsteadiness on feet: Secondary | ICD-10-CM | POA: Diagnosis not present

## 2023-04-24 DIAGNOSIS — M6281 Muscle weakness (generalized): Secondary | ICD-10-CM | POA: Diagnosis not present

## 2023-04-25 DIAGNOSIS — R278 Other lack of coordination: Secondary | ICD-10-CM | POA: Diagnosis not present

## 2023-04-25 DIAGNOSIS — Z9181 History of falling: Secondary | ICD-10-CM | POA: Diagnosis not present

## 2023-04-25 DIAGNOSIS — R4701 Aphasia: Secondary | ICD-10-CM | POA: Diagnosis not present

## 2023-04-25 DIAGNOSIS — R2681 Unsteadiness on feet: Secondary | ICD-10-CM | POA: Diagnosis not present

## 2023-04-25 DIAGNOSIS — M6281 Muscle weakness (generalized): Secondary | ICD-10-CM | POA: Diagnosis not present

## 2023-04-25 DIAGNOSIS — R41841 Cognitive communication deficit: Secondary | ICD-10-CM | POA: Diagnosis not present

## 2023-04-26 DIAGNOSIS — R2681 Unsteadiness on feet: Secondary | ICD-10-CM | POA: Diagnosis not present

## 2023-04-26 DIAGNOSIS — R278 Other lack of coordination: Secondary | ICD-10-CM | POA: Diagnosis not present

## 2023-04-26 DIAGNOSIS — Z9181 History of falling: Secondary | ICD-10-CM | POA: Diagnosis not present

## 2023-04-26 DIAGNOSIS — R41841 Cognitive communication deficit: Secondary | ICD-10-CM | POA: Diagnosis not present

## 2023-04-26 DIAGNOSIS — R4701 Aphasia: Secondary | ICD-10-CM | POA: Diagnosis not present

## 2023-04-26 DIAGNOSIS — M6281 Muscle weakness (generalized): Secondary | ICD-10-CM | POA: Diagnosis not present

## 2023-04-27 DIAGNOSIS — H353221 Exudative age-related macular degeneration, left eye, with active choroidal neovascularization: Secondary | ICD-10-CM | POA: Diagnosis not present

## 2023-04-30 ENCOUNTER — Encounter: Payer: Self-pay | Admitting: Orthopedic Surgery

## 2023-04-30 ENCOUNTER — Non-Acute Institutional Stay (SKILLED_NURSING_FACILITY): Payer: Self-pay | Admitting: Orthopedic Surgery

## 2023-04-30 DIAGNOSIS — N390 Urinary tract infection, site not specified: Secondary | ICD-10-CM

## 2023-04-30 DIAGNOSIS — M6281 Muscle weakness (generalized): Secondary | ICD-10-CM | POA: Diagnosis not present

## 2023-04-30 DIAGNOSIS — R2681 Unsteadiness on feet: Secondary | ICD-10-CM | POA: Diagnosis not present

## 2023-04-30 DIAGNOSIS — H9193 Unspecified hearing loss, bilateral: Secondary | ICD-10-CM

## 2023-04-30 DIAGNOSIS — Z9181 History of falling: Secondary | ICD-10-CM | POA: Diagnosis not present

## 2023-04-30 DIAGNOSIS — M810 Age-related osteoporosis without current pathological fracture: Secondary | ICD-10-CM | POA: Diagnosis not present

## 2023-04-30 DIAGNOSIS — I10 Essential (primary) hypertension: Secondary | ICD-10-CM

## 2023-04-30 DIAGNOSIS — R4701 Aphasia: Secondary | ICD-10-CM | POA: Diagnosis not present

## 2023-04-30 DIAGNOSIS — M25571 Pain in right ankle and joints of right foot: Secondary | ICD-10-CM | POA: Diagnosis not present

## 2023-04-30 DIAGNOSIS — R278 Other lack of coordination: Secondary | ICD-10-CM | POA: Diagnosis not present

## 2023-04-30 DIAGNOSIS — R41841 Cognitive communication deficit: Secondary | ICD-10-CM | POA: Diagnosis not present

## 2023-04-30 NOTE — Progress Notes (Signed)
Location:   [facility]  Nursing Home Room Number: 14-A [name]:  SNF [phone]) Provider:  Hazle Nordmann, NP  PCP: Mahlon Gammon, MD  Patient Care Team: Mahlon Gammon, MD as PCP - General (Internal Medicine) Runell Gess, MD as PCP - Cardiology (Cardiology) Carman Ching, MD (Inactive) as Consulting Physician (Gastroenterology) Esaw Dace, MD as Attending Physician (Urology) Runell Gess, MD as Consulting Physician (Cardiology) Drema Halon, MD (Inactive) as Consulting Physician (Otolaryngology) Elizebeth Koller, DDS (Dentistry) Mckinley Jewel, MD as Consulting Physician (Ophthalmology) Bradly Bienenstock, MD as Consulting Physician (Orthopedic Surgery) Gaynelle Adu, MD as Consulting Physician (General Surgery)  Extended Emergency Contact Information Primary Emergency Contact: Sheran Luz Address: [address]          [place], [place] [address] D[place] of [place] Home Phone: [phone] Work Phone: [phone] Mobile Phone: [phone] Relation: Daughter Secondary Emergency Contact: Lia Hopping Address: [address]          [place], [place] [address] [place] of [place] Home Phone: [phone] Mobile Phone: [phone] Relation: Son  Code Status:  DNR Goals of care: Advanced Directive information    04/30/2023   11:04 AM  Advanced Directives  Does Patient Have a Medical Advance Directive? Yes  Type of Estate agent of [place];Living will;Out of facility DNR (pink MOST or yellow form)  Does patient want to make changes to medical advance directive? No - Patient declined  Copy of Healthcare Power of Attorney in Chart? Yes - validated most recent copy scanned in chart (See row information)     Chief Complaint  Patient presents with   Medical Management of Chronic Issues    Routine Visit.    Immunizations    Discuss the need for DTAP vaccine.     HPI:  Pt is a 89 y.o. female seen today  for medical management of chronic diseases.   She currently resides on the skilled nursing unit at [facility]. PMH: hypertension, hyperlipidemia, macular degeneration, mild cognitive impairment, recurrent UTI, GERD, H/o TIA.   HTN- BUN/creat 24/0.6 03/05/2023, remains on metoprolol TIA- per chart review since 2014, not followed by neurology, remains on Plavix Recurrent UTI- followed by urology, remains on Keflex Unstable gait- ambulates with wheelchair, 1+ assist with ambulation, working with PT at this time Acute right ankle pain- first thought to be cellulitis due to increased redness/warmth/swelling, improved pain since starting PT, remains on scheduled tylenol  Hearing loss- awaiting AIM consult, completed Debrox and flush last month Senile osteoporosis- DEXA 2018, t score -2.3, remains on vitamin D  No recent falls or injuries.   Recent blood pressures:  12/10- 132/76  12/03- 158/86  11/26- 154/88  Recent weights:  12/01- 166.1 lbs  11/01- 162 lbs  10/01- 160.2 lbs      Past Medical History:  Diagnosis Date   Cervicalgia    Chest pain at rest 01/07/2012   Displacement of cervical intervertebral disc without myelopathy    Diverticulosis of colon (without mention of hemorrhage)    Female stress incontinence    GERD (gastroesophageal reflux disease)    no peds occasional pepcid   Inguinal hernia without mention of obstruction or gangrene, unilateral or unspecified, (not specified as recurrent)    Internal hemorrhoids without mention of complication    Lumbago    Macular degeneration (senile) of retina, unspecified    Osteoarthrosis, unspecified whether generalized or localized, unspecified site    Other and unspecified hyperlipidemia  Pain in joint, hand    Palpitations    Reflux esophagitis    Scoliosis (and kyphoscoliosis), idiopathic    Senile osteoporosis    Skin disorder    Thyroid disease    TIA (transient ischemic attack)    Unruptured popliteal cyst  06/24/2014   Right knee    Unspecified essential hypertension    Unspecified glaucoma(365.9)    Unspecified hypothyroidism    Unspecified vitamin D deficiency    Past Surgical History:  Procedure Laterality Date   ABDOMINAL HYSTERECTOMY  1975   Dr Dewaine Conger   APPENDECTOMY  1988   cardiolite myocardial perfusion study     DOPPLER ECHOCARDIOGRAPHY     EYE SURGERY Bilateral 2009   cataract removed right eye, Dr Emmit Pomfret   FOOT SURGERY  august 2013   Hewitt, MD   INGUINAL HERNIA REPAIR Bilateral    w/mesh   INGUINAL HERNIA REPAIR Left 08/03/2017   Procedure: LAPAROSCOPIC LEFT INGUINAL HERNIA REPAIR WITH MESH;  Surgeon: Gaynelle Adu, MD;  Location: [facility] OR;  Service: General;  Laterality: Left;   INGUINAL HERNIA REPAIR Right 08/03/2017   Procedure: HERNIA REPAIR RIGHT INGUINAL ADULT WITH MESH;  Surgeon: Gaynelle Adu, MD;  Location: [facility] OR;  Service: General;  Laterality: Right;   INGUINAL HERNIA REPAIR     Dr. Andrey Campanile 04-24-18   INGUINAL HERNIA REPAIR Right 04/24/2018   Procedure: DIAGNOSTIC LAPAROSCOPIC, OPENREPAIR OF RECURRENT RIGHT INGUINAL HERNIA WITH MESH ERAS PATHWAY;  Surgeon: Gaynelle Adu, MD;  Location: [facility] ORS;  Service: General;  Laterality: Right;   INSERTION OF MESH Bilateral 08/03/2017   Procedure: INSERTION OF MESH;  Surgeon: Gaynelle Adu, MD;  Location: [facility] OR;  Service: General;  Laterality: Bilateral;   NM MYOVIEW LTD     negative   TONSILLECTOMY  1937   TOOTH EXTRACTION  09/16/13   Dr Randel Books    Allergies  Allergen Reactions   Trimethoprim Other (See Comments)    Headaches/ ear pressure    Erythromycin Other (See Comments)    UNSPECIFIED REACTION    Macrodantin [Nitrofurantoin Macrocrystal] Other (See Comments)    UNSPECIFIED REACTION    Other     Honeydew Melon   Penicillins Other (See Comments)    UNSPECIFIED REACTION  Has patient had a PCN reaction causing immediate rash, facial/tongue/throat swelling, SOB or lightheadedness with hypotension: Unknown Has patient had a  PCN reaction causing severe rash involving mucus membranes or skin necrosis: Unknown Has patient had a PCN reaction that required hospitalization: No Has patient had a PCN reaction occurring within the last 10 years: No If all of the above answers are "NO", then may proceed with Cephalosporin use.   Sulfa Antibiotics Other (See Comments)    UNSPECIFIED REACTION    Latex Itching    Allergies as of 04/30/2023       Reactions   Trimethoprim Other (See Comments)   Headaches/ ear pressure    Erythromycin Other (See Comments)   UNSPECIFIED REACTION    Macrodantin [nitrofurantoin Macrocrystal] Other (See Comments)   UNSPECIFIED REACTION    Other    Honeydew Melon   Penicillins Other (See Comments)   UNSPECIFIED REACTION  Has patient had a PCN reaction causing immediate rash, facial/tongue/throat swelling, SOB or lightheadedness with hypotension: Unknown Has patient had a PCN reaction causing severe rash involving mucus membranes or skin necrosis: Unknown Has patient had a PCN reaction that required hospitalization: No Has patient had a PCN reaction occurring within the last 10 years: No If all of  the above answers are "NO", then may proceed with Cephalosporin use.   Sulfa Antibiotics Other (See Comments)   UNSPECIFIED REACTION    Latex Itching        Medication List        Accurate as of April 30, 2023 11:04 AM. If you have any questions, ask your nurse or doctor.          acetaminophen 500 MG tablet Commonly known as: TYLENOL Take 500 mg by mouth 2 (two) times daily as needed for mild pain or headache.   acetaminophen 500 MG tablet Commonly known as: TYLENOL Take 2 tablets (1,000 mg total) by mouth 2 (two) times daily for 14 days.   ascorbic acid 500 MG tablet Commonly known as: VITAMIN C Take 500 mg by mouth daily with supper.   AZO CRANBERRY PO Take 1 tablet by mouth daily.   cephALEXin 250 MG capsule Commonly known as: KEFLEX Take 250 mg by mouth daily.    clopidogrel 75 MG tablet Commonly known as: PLAVIX TAKE ONE TABLET BY MOUTH DAILY   metoprolol succinate 25 MG 24 hr tablet Commonly known as: TOPROL-XL Take one tablet by mouth once daily to regulate heart and control blood pressure.   PRESERVISION AREDS 2 PO Take 1 capsule by mouth 2 (two) times daily.   Vitamin D3 50 MCG (2000 UT) Tabs Take by mouth.   Zinc Oxide 10 % Oint Apply 1 Application topically as needed.        Review of Systems  Constitutional:  Negative for activity change and appetite change.  HENT:  Positive for hearing loss. Negative for trouble swallowing.   Eyes:  Negative for visual disturbance.  Respiratory:  Negative for cough, shortness of breath and wheezing.   Cardiovascular:  Negative for chest pain and leg swelling.  Gastrointestinal:  Negative for abdominal distention and abdominal pain.  Genitourinary:  Negative for dysuria, frequency and hematuria.  Musculoskeletal:  Positive for arthralgias and gait problem.  Skin:  Negative for wound.  Neurological:  Positive for weakness. Negative for dizziness and headaches.  Psychiatric/Behavioral:  Negative for confusion, dysphoric mood and sleep disturbance. The patient is not nervous/anxious.     Immunization History  Administered Date(s) Administered   Fluad Quad(high Dose 65+) 02/04/2019, 03/01/2022   Influenza Whole 02/24/2011, 02/12/2012   Influenza, High Dose Seasonal PF 02/01/2017, 02/25/2018, 02/11/2020, 03/13/2023   Influenza,inj,Quad PF,6+ Mos 02/20/2013, 02/10/2014, 02/19/2015, 02/18/2016   Influenza-Unspecified 02/16/2021   Moderna Covid-19 Fall Seasonal Vaccine 34yrs & older 02/09/2022, 03/21/2023   Moderna Sars-Covid-2 Vaccination 05/19/2019, 06/16/2019, 03/29/2020, 09/27/2020   Pneumococcal Conjugate-13 06/24/2014   Pneumococcal Polysaccharide-23 04/12/1998   Td 02/21/1996, 07/23/2003   Tdap 03/20/2010   Zoster Recombinant(Shingrix) 09/19/2016, 12/02/2016   Zoster, Live 09/15/2005    Pertinent  Health Maintenance Due  Topic Date Due   INFLUENZA VACCINE  Completed   DEXA SCAN  Completed      07/31/2022   11:27 AM 08/23/2022    1:05 PM 09/06/2022    2:27 PM 12/27/2022    2:15 PM 04/18/2023    4:49 PM  Fall Risk  Falls in the past year? 1 1 1  0 0  Was there an injury with Fall? 1 0 1 0 0  Fall Risk Category Calculator 2 1 2  0 0  Patient at Risk for Falls Due to Impaired balance/gait;Impaired mobility History of fall(s) History of fall(s);Impaired balance/gait;Impaired mobility No Fall Risks History of fall(s);Impaired balance/gait;Impaired mobility  Fall risk Follow up Falls evaluation completed;Education  provided;Falls prevention discussed Falls evaluation completed Falls evaluation completed;Education provided;Falls prevention discussed Falls evaluation completed;Education provided;Falls prevention discussed Falls evaluation completed;Education provided;Falls prevention discussed   Functional Status Survey:    Vitals:   04/30/23 1101  BP: 132/76  Pulse: 70  Resp: 16  Temp: (!) 96.3 F (35.7 C)  SpO2: 98%  Weight: 166 lb 1.6 oz (75.3 kg)  Height: 5\' 4"  (1.626 m)   Body mass index is 28.51 kg/m. Physical Exam Vitals reviewed.  Constitutional:      General: She is not in acute distress. HENT:     Head: Normocephalic.     Right Ear: There is no impacted cerumen.     Left Ear: There is no impacted cerumen.     Nose: Nose normal.     Mouth/Throat:     Mouth: Mucous membranes are moist.  Eyes:     General:        Right eye: No discharge.        Left eye: No discharge.  Cardiovascular:     Rate and Rhythm: Normal rate and regular rhythm.     Pulses: Normal pulses.     Heart sounds: Normal heart sounds.  Pulmonary:     Effort: Pulmonary effort is normal. No respiratory distress.     Breath sounds: Normal breath sounds. No wheezing.  Abdominal:     General: Bowel sounds are normal.     Palpations: Abdomen is soft.  Musculoskeletal:     Cervical  back: Neck supple.     Right lower leg: No edema.     Left lower leg: No edema.  Skin:    General: Skin is warm.     Capillary Refill: Capillary refill takes less than 2 seconds.  Neurological:     General: No focal deficit present.     Mental Status: She is alert and oriented to person, place, and time.     Motor: Weakness present.     Gait: Gait abnormal.     Comments: Wheelchair, 1+ assist with walker  Psychiatric:        Mood and Affect: Mood normal.     Labs reviewed: Recent Labs    07/26/22 1715 10/31/22 0000 03/05/23 0000  NA 139 141 140  K 4.2 3.7 4.2  CL 102 104 105  CO2 27 24* 27*  GLUCOSE 100*  --   --   BUN 18 29* 24*  CREATININE 0.74 0.8 0.6  CALCIUM 9.8 9.5 8.8   Recent Labs    10/31/22 0000 03/05/23 0000  AST 21 14  ALT 18 16  ALKPHOS 71 71  ALBUMIN 4.1 3.4*   Recent Labs    07/26/22 1715 10/31/22 0000 03/05/23 0000  WBC 7.0 10.3 7.4  NEUTROABS 4.1  --  4,151.00  HGB 13.6 13.8 12.2  HCT 41.0 42 37  MCV 92.6  --   --   PLT 207 234 219   Lab Results  Component Value Date   TSH 3.20 10/31/2022   Lab Results  Component Value Date   HGBA1C 5.3 05/02/2013   Lab Results  Component Value Date   CHOL 188 07/10/2019   HDL 75 07/10/2019   LDLCALC 94 07/10/2019   TRIG 96 07/10/2019   CHOLHDL 2.5 07/10/2019    Significant Diagnostic Results in last 30 days:  No results found.  Assessment/Plan 1. Essential hypertension (Primary) - controlled with metoprolol  2. Recurrent UTI - followed by [facility] - cont Keflex  3. Unstable  gait - no recent falls - working with PT> 1+ assist with walker - cont skilled nursing   4. Acute right ankle pain - ongoing - first thought to be cellulitis> erythema/swelling/warmth> completed doxy - improved pain - suspect OA - cont scheduled tylenol  5. Bilateral hearing loss, unspecified hearing loss type - ongoing - 03/2023 dexbox and ear flushing done - scheduled to see AIM 12/27  6.  Senile osteoporosis - DEXA 2018, t score -2.3 - cont vitamin D - consider repeat DEXA if patient becomes more ambulatory    Family/ staff Communication: plan discussed with patient and nurse  Labs/tests ordered: none

## 2023-05-01 DIAGNOSIS — M6281 Muscle weakness (generalized): Secondary | ICD-10-CM | POA: Diagnosis not present

## 2023-05-01 DIAGNOSIS — R278 Other lack of coordination: Secondary | ICD-10-CM | POA: Diagnosis not present

## 2023-05-01 DIAGNOSIS — R2681 Unsteadiness on feet: Secondary | ICD-10-CM | POA: Diagnosis not present

## 2023-05-01 DIAGNOSIS — R41841 Cognitive communication deficit: Secondary | ICD-10-CM | POA: Diagnosis not present

## 2023-05-01 DIAGNOSIS — Z9181 History of falling: Secondary | ICD-10-CM | POA: Diagnosis not present

## 2023-05-01 DIAGNOSIS — R4701 Aphasia: Secondary | ICD-10-CM | POA: Diagnosis not present

## 2023-05-02 DIAGNOSIS — Z9181 History of falling: Secondary | ICD-10-CM | POA: Diagnosis not present

## 2023-05-02 DIAGNOSIS — M6281 Muscle weakness (generalized): Secondary | ICD-10-CM | POA: Diagnosis not present

## 2023-05-02 DIAGNOSIS — R41841 Cognitive communication deficit: Secondary | ICD-10-CM | POA: Diagnosis not present

## 2023-05-02 DIAGNOSIS — R4701 Aphasia: Secondary | ICD-10-CM | POA: Diagnosis not present

## 2023-05-02 DIAGNOSIS — R278 Other lack of coordination: Secondary | ICD-10-CM | POA: Diagnosis not present

## 2023-05-02 DIAGNOSIS — R2681 Unsteadiness on feet: Secondary | ICD-10-CM | POA: Diagnosis not present

## 2023-05-03 DIAGNOSIS — R4701 Aphasia: Secondary | ICD-10-CM | POA: Diagnosis not present

## 2023-05-03 DIAGNOSIS — R2681 Unsteadiness on feet: Secondary | ICD-10-CM | POA: Diagnosis not present

## 2023-05-03 DIAGNOSIS — Z9181 History of falling: Secondary | ICD-10-CM | POA: Diagnosis not present

## 2023-05-03 DIAGNOSIS — R41841 Cognitive communication deficit: Secondary | ICD-10-CM | POA: Diagnosis not present

## 2023-05-03 DIAGNOSIS — M6281 Muscle weakness (generalized): Secondary | ICD-10-CM | POA: Diagnosis not present

## 2023-05-03 DIAGNOSIS — R278 Other lack of coordination: Secondary | ICD-10-CM | POA: Diagnosis not present

## 2023-05-04 DIAGNOSIS — M6281 Muscle weakness (generalized): Secondary | ICD-10-CM | POA: Diagnosis not present

## 2023-05-04 DIAGNOSIS — Z9181 History of falling: Secondary | ICD-10-CM | POA: Diagnosis not present

## 2023-05-04 DIAGNOSIS — R41841 Cognitive communication deficit: Secondary | ICD-10-CM | POA: Diagnosis not present

## 2023-05-04 DIAGNOSIS — R4701 Aphasia: Secondary | ICD-10-CM | POA: Diagnosis not present

## 2023-05-04 DIAGNOSIS — R278 Other lack of coordination: Secondary | ICD-10-CM | POA: Diagnosis not present

## 2023-05-04 DIAGNOSIS — R2681 Unsteadiness on feet: Secondary | ICD-10-CM | POA: Diagnosis not present

## 2023-05-07 DIAGNOSIS — Z9181 History of falling: Secondary | ICD-10-CM | POA: Diagnosis not present

## 2023-05-07 DIAGNOSIS — R41841 Cognitive communication deficit: Secondary | ICD-10-CM | POA: Diagnosis not present

## 2023-05-07 DIAGNOSIS — R4701 Aphasia: Secondary | ICD-10-CM | POA: Diagnosis not present

## 2023-05-07 DIAGNOSIS — R2681 Unsteadiness on feet: Secondary | ICD-10-CM | POA: Diagnosis not present

## 2023-05-07 DIAGNOSIS — M6281 Muscle weakness (generalized): Secondary | ICD-10-CM | POA: Diagnosis not present

## 2023-05-07 DIAGNOSIS — R278 Other lack of coordination: Secondary | ICD-10-CM | POA: Diagnosis not present

## 2023-05-08 DIAGNOSIS — R41841 Cognitive communication deficit: Secondary | ICD-10-CM | POA: Diagnosis not present

## 2023-05-08 DIAGNOSIS — R2681 Unsteadiness on feet: Secondary | ICD-10-CM | POA: Diagnosis not present

## 2023-05-08 DIAGNOSIS — R278 Other lack of coordination: Secondary | ICD-10-CM | POA: Diagnosis not present

## 2023-05-08 DIAGNOSIS — R4701 Aphasia: Secondary | ICD-10-CM | POA: Diagnosis not present

## 2023-05-08 DIAGNOSIS — M6281 Muscle weakness (generalized): Secondary | ICD-10-CM | POA: Diagnosis not present

## 2023-05-08 DIAGNOSIS — Z9181 History of falling: Secondary | ICD-10-CM | POA: Diagnosis not present

## 2023-05-10 DIAGNOSIS — R4701 Aphasia: Secondary | ICD-10-CM | POA: Diagnosis not present

## 2023-05-10 DIAGNOSIS — M6281 Muscle weakness (generalized): Secondary | ICD-10-CM | POA: Diagnosis not present

## 2023-05-10 DIAGNOSIS — Z9181 History of falling: Secondary | ICD-10-CM | POA: Diagnosis not present

## 2023-05-10 DIAGNOSIS — R41841 Cognitive communication deficit: Secondary | ICD-10-CM | POA: Diagnosis not present

## 2023-05-10 DIAGNOSIS — R2681 Unsteadiness on feet: Secondary | ICD-10-CM | POA: Diagnosis not present

## 2023-05-10 DIAGNOSIS — R278 Other lack of coordination: Secondary | ICD-10-CM | POA: Diagnosis not present

## 2023-05-12 DIAGNOSIS — R4701 Aphasia: Secondary | ICD-10-CM | POA: Diagnosis not present

## 2023-05-12 DIAGNOSIS — Z9181 History of falling: Secondary | ICD-10-CM | POA: Diagnosis not present

## 2023-05-12 DIAGNOSIS — R2681 Unsteadiness on feet: Secondary | ICD-10-CM | POA: Diagnosis not present

## 2023-05-12 DIAGNOSIS — M6281 Muscle weakness (generalized): Secondary | ICD-10-CM | POA: Diagnosis not present

## 2023-05-12 DIAGNOSIS — R41841 Cognitive communication deficit: Secondary | ICD-10-CM | POA: Diagnosis not present

## 2023-05-12 DIAGNOSIS — R278 Other lack of coordination: Secondary | ICD-10-CM | POA: Diagnosis not present

## 2023-05-14 DIAGNOSIS — R41841 Cognitive communication deficit: Secondary | ICD-10-CM | POA: Diagnosis not present

## 2023-05-14 DIAGNOSIS — R2681 Unsteadiness on feet: Secondary | ICD-10-CM | POA: Diagnosis not present

## 2023-05-14 DIAGNOSIS — Z9181 History of falling: Secondary | ICD-10-CM | POA: Diagnosis not present

## 2023-05-14 DIAGNOSIS — R278 Other lack of coordination: Secondary | ICD-10-CM | POA: Diagnosis not present

## 2023-05-14 DIAGNOSIS — M6281 Muscle weakness (generalized): Secondary | ICD-10-CM | POA: Diagnosis not present

## 2023-05-14 DIAGNOSIS — R4701 Aphasia: Secondary | ICD-10-CM | POA: Diagnosis not present

## 2023-05-15 DIAGNOSIS — R4701 Aphasia: Secondary | ICD-10-CM | POA: Diagnosis not present

## 2023-05-15 DIAGNOSIS — R278 Other lack of coordination: Secondary | ICD-10-CM | POA: Diagnosis not present

## 2023-05-15 DIAGNOSIS — Z9181 History of falling: Secondary | ICD-10-CM | POA: Diagnosis not present

## 2023-05-15 DIAGNOSIS — M6281 Muscle weakness (generalized): Secondary | ICD-10-CM | POA: Diagnosis not present

## 2023-05-15 DIAGNOSIS — R41841 Cognitive communication deficit: Secondary | ICD-10-CM | POA: Diagnosis not present

## 2023-05-15 DIAGNOSIS — R2681 Unsteadiness on feet: Secondary | ICD-10-CM | POA: Diagnosis not present

## 2023-05-17 DIAGNOSIS — R278 Other lack of coordination: Secondary | ICD-10-CM | POA: Diagnosis not present

## 2023-05-17 DIAGNOSIS — M6281 Muscle weakness (generalized): Secondary | ICD-10-CM | POA: Diagnosis not present

## 2023-05-17 DIAGNOSIS — R41841 Cognitive communication deficit: Secondary | ICD-10-CM | POA: Diagnosis not present

## 2023-05-17 DIAGNOSIS — R4701 Aphasia: Secondary | ICD-10-CM | POA: Diagnosis not present

## 2023-05-17 DIAGNOSIS — Z9181 History of falling: Secondary | ICD-10-CM | POA: Diagnosis not present

## 2023-05-17 DIAGNOSIS — R2681 Unsteadiness on feet: Secondary | ICD-10-CM | POA: Diagnosis not present

## 2023-05-18 DIAGNOSIS — M6281 Muscle weakness (generalized): Secondary | ICD-10-CM | POA: Diagnosis not present

## 2023-05-18 DIAGNOSIS — R2681 Unsteadiness on feet: Secondary | ICD-10-CM | POA: Diagnosis not present

## 2023-05-18 DIAGNOSIS — R278 Other lack of coordination: Secondary | ICD-10-CM | POA: Diagnosis not present

## 2023-05-18 DIAGNOSIS — R4701 Aphasia: Secondary | ICD-10-CM | POA: Diagnosis not present

## 2023-05-18 DIAGNOSIS — Z9181 History of falling: Secondary | ICD-10-CM | POA: Diagnosis not present

## 2023-05-18 DIAGNOSIS — R41841 Cognitive communication deficit: Secondary | ICD-10-CM | POA: Diagnosis not present

## 2023-05-21 DIAGNOSIS — R2681 Unsteadiness on feet: Secondary | ICD-10-CM | POA: Diagnosis not present

## 2023-05-21 DIAGNOSIS — R4701 Aphasia: Secondary | ICD-10-CM | POA: Diagnosis not present

## 2023-05-21 DIAGNOSIS — M6281 Muscle weakness (generalized): Secondary | ICD-10-CM | POA: Diagnosis not present

## 2023-05-21 DIAGNOSIS — R41841 Cognitive communication deficit: Secondary | ICD-10-CM | POA: Diagnosis not present

## 2023-05-21 DIAGNOSIS — Z9181 History of falling: Secondary | ICD-10-CM | POA: Diagnosis not present

## 2023-05-21 DIAGNOSIS — R278 Other lack of coordination: Secondary | ICD-10-CM | POA: Diagnosis not present

## 2023-05-22 DIAGNOSIS — R41841 Cognitive communication deficit: Secondary | ICD-10-CM | POA: Diagnosis not present

## 2023-05-22 DIAGNOSIS — R278 Other lack of coordination: Secondary | ICD-10-CM | POA: Diagnosis not present

## 2023-05-22 DIAGNOSIS — M6281 Muscle weakness (generalized): Secondary | ICD-10-CM | POA: Diagnosis not present

## 2023-05-22 DIAGNOSIS — Z9181 History of falling: Secondary | ICD-10-CM | POA: Diagnosis not present

## 2023-05-22 DIAGNOSIS — R2681 Unsteadiness on feet: Secondary | ICD-10-CM | POA: Diagnosis not present

## 2023-05-22 DIAGNOSIS — R4701 Aphasia: Secondary | ICD-10-CM | POA: Diagnosis not present

## 2023-05-23 DIAGNOSIS — R41841 Cognitive communication deficit: Secondary | ICD-10-CM | POA: Diagnosis not present

## 2023-05-23 DIAGNOSIS — R2681 Unsteadiness on feet: Secondary | ICD-10-CM | POA: Diagnosis not present

## 2023-05-23 DIAGNOSIS — M6281 Muscle weakness (generalized): Secondary | ICD-10-CM | POA: Diagnosis not present

## 2023-05-23 DIAGNOSIS — Z9181 History of falling: Secondary | ICD-10-CM | POA: Diagnosis not present

## 2023-05-23 DIAGNOSIS — R4701 Aphasia: Secondary | ICD-10-CM | POA: Diagnosis not present

## 2023-05-23 DIAGNOSIS — R278 Other lack of coordination: Secondary | ICD-10-CM | POA: Diagnosis not present

## 2023-05-25 DIAGNOSIS — M6281 Muscle weakness (generalized): Secondary | ICD-10-CM | POA: Diagnosis not present

## 2023-05-25 DIAGNOSIS — R2681 Unsteadiness on feet: Secondary | ICD-10-CM | POA: Diagnosis not present

## 2023-05-25 DIAGNOSIS — Z9181 History of falling: Secondary | ICD-10-CM | POA: Diagnosis not present

## 2023-05-25 DIAGNOSIS — R4701 Aphasia: Secondary | ICD-10-CM | POA: Diagnosis not present

## 2023-05-25 DIAGNOSIS — R41841 Cognitive communication deficit: Secondary | ICD-10-CM | POA: Diagnosis not present

## 2023-05-25 DIAGNOSIS — R278 Other lack of coordination: Secondary | ICD-10-CM | POA: Diagnosis not present

## 2023-05-28 DIAGNOSIS — R2681 Unsteadiness on feet: Secondary | ICD-10-CM | POA: Diagnosis not present

## 2023-05-28 DIAGNOSIS — R41841 Cognitive communication deficit: Secondary | ICD-10-CM | POA: Diagnosis not present

## 2023-05-28 DIAGNOSIS — R278 Other lack of coordination: Secondary | ICD-10-CM | POA: Diagnosis not present

## 2023-05-28 DIAGNOSIS — M6281 Muscle weakness (generalized): Secondary | ICD-10-CM | POA: Diagnosis not present

## 2023-05-28 DIAGNOSIS — Z9181 History of falling: Secondary | ICD-10-CM | POA: Diagnosis not present

## 2023-05-28 DIAGNOSIS — R4701 Aphasia: Secondary | ICD-10-CM | POA: Diagnosis not present

## 2023-05-29 DIAGNOSIS — R2681 Unsteadiness on feet: Secondary | ICD-10-CM | POA: Diagnosis not present

## 2023-05-29 DIAGNOSIS — Z9181 History of falling: Secondary | ICD-10-CM | POA: Diagnosis not present

## 2023-05-29 DIAGNOSIS — H903 Sensorineural hearing loss, bilateral: Secondary | ICD-10-CM | POA: Diagnosis not present

## 2023-05-29 DIAGNOSIS — R41841 Cognitive communication deficit: Secondary | ICD-10-CM | POA: Diagnosis not present

## 2023-05-29 DIAGNOSIS — R4701 Aphasia: Secondary | ICD-10-CM | POA: Diagnosis not present

## 2023-05-29 DIAGNOSIS — M6281 Muscle weakness (generalized): Secondary | ICD-10-CM | POA: Diagnosis not present

## 2023-05-29 DIAGNOSIS — R278 Other lack of coordination: Secondary | ICD-10-CM | POA: Diagnosis not present

## 2023-05-30 DIAGNOSIS — R2681 Unsteadiness on feet: Secondary | ICD-10-CM | POA: Diagnosis not present

## 2023-05-30 DIAGNOSIS — M6281 Muscle weakness (generalized): Secondary | ICD-10-CM | POA: Diagnosis not present

## 2023-05-30 DIAGNOSIS — R278 Other lack of coordination: Secondary | ICD-10-CM | POA: Diagnosis not present

## 2023-05-30 DIAGNOSIS — R4701 Aphasia: Secondary | ICD-10-CM | POA: Diagnosis not present

## 2023-05-30 DIAGNOSIS — R41841 Cognitive communication deficit: Secondary | ICD-10-CM | POA: Diagnosis not present

## 2023-05-30 DIAGNOSIS — Z9181 History of falling: Secondary | ICD-10-CM | POA: Diagnosis not present

## 2023-05-31 DIAGNOSIS — R278 Other lack of coordination: Secondary | ICD-10-CM | POA: Diagnosis not present

## 2023-05-31 DIAGNOSIS — Z9181 History of falling: Secondary | ICD-10-CM | POA: Diagnosis not present

## 2023-05-31 DIAGNOSIS — R4701 Aphasia: Secondary | ICD-10-CM | POA: Diagnosis not present

## 2023-05-31 DIAGNOSIS — R41841 Cognitive communication deficit: Secondary | ICD-10-CM | POA: Diagnosis not present

## 2023-05-31 DIAGNOSIS — M6281 Muscle weakness (generalized): Secondary | ICD-10-CM | POA: Diagnosis not present

## 2023-05-31 DIAGNOSIS — R2681 Unsteadiness on feet: Secondary | ICD-10-CM | POA: Diagnosis not present

## 2023-06-01 ENCOUNTER — Encounter: Payer: Self-pay | Admitting: Orthopedic Surgery

## 2023-06-01 ENCOUNTER — Non-Acute Institutional Stay (SKILLED_NURSING_FACILITY): Payer: Self-pay | Admitting: Orthopedic Surgery

## 2023-06-01 DIAGNOSIS — H9193 Unspecified hearing loss, bilateral: Secondary | ICD-10-CM

## 2023-06-01 DIAGNOSIS — M6281 Muscle weakness (generalized): Secondary | ICD-10-CM | POA: Diagnosis not present

## 2023-06-01 DIAGNOSIS — R278 Other lack of coordination: Secondary | ICD-10-CM | POA: Diagnosis not present

## 2023-06-01 DIAGNOSIS — I10 Essential (primary) hypertension: Secondary | ICD-10-CM | POA: Diagnosis not present

## 2023-06-01 DIAGNOSIS — M810 Age-related osteoporosis without current pathological fracture: Secondary | ICD-10-CM

## 2023-06-01 DIAGNOSIS — R4701 Aphasia: Secondary | ICD-10-CM | POA: Diagnosis not present

## 2023-06-01 DIAGNOSIS — M150 Primary generalized (osteo)arthritis: Secondary | ICD-10-CM | POA: Diagnosis not present

## 2023-06-01 DIAGNOSIS — N390 Urinary tract infection, site not specified: Secondary | ICD-10-CM

## 2023-06-01 DIAGNOSIS — Z9181 History of falling: Secondary | ICD-10-CM | POA: Diagnosis not present

## 2023-06-01 DIAGNOSIS — G459 Transient cerebral ischemic attack, unspecified: Secondary | ICD-10-CM | POA: Diagnosis not present

## 2023-06-01 DIAGNOSIS — R41841 Cognitive communication deficit: Secondary | ICD-10-CM | POA: Diagnosis not present

## 2023-06-01 DIAGNOSIS — R2681 Unsteadiness on feet: Secondary | ICD-10-CM | POA: Diagnosis not present

## 2023-06-01 NOTE — Progress Notes (Signed)
Location:  [facility] Room Number: 14/A [name]:  SNF [phone]) Provider:  Octavia Heir, NP   Mahlon Gammon, MD  Patient Care Team: Mahlon Gammon, MD as PCP - General (Internal Medicine) Runell Gess, MD as PCP - Cardiology (Cardiology) Carman Ching, MD (Inactive) as Consulting Physician (Gastroenterology) Esaw Dace, MD as Attending Physician (Urology) Runell Gess, MD as Consulting Physician (Cardiology) Drema Halon, MD (Inactive) as Consulting Physician (Otolaryngology) Elizebeth Koller, DDS (Dentistry) Mckinley Jewel, MD as Consulting Physician (Ophthalmology) Bradly Bienenstock, MD as Consulting Physician (Orthopedic Surgery) Gaynelle Adu, MD as Consulting Physician (General Surgery)  Extended Emergency Contact Information Primary Emergency Contact: Sheran Luz Address: [address]          [place], [place] [address] [place] of [place] Home Phone: [phone] Work Phone: [phone] Mobile Phone: [phone] Relation: Daughter Secondary Emergency Contact: Lia Hopping Address: [address]          [place], [place] [address] [place] of [place] Home Phone: [phone] Mobile Phone: [phone] Relation: Son  Code Status:  DNR Goals of care: Advanced Directive information    04/30/2023   11:04 AM  Advanced Directives  Does Patient Have a Medical Advance Directive? Yes  Type of Estate agent of [place];Living will;Out of facility DNR (pink MOST or yellow form)  Does patient want to make changes to medical advance directive? No - Patient declined  Copy of Healthcare Power of Attorney in Chart? Yes - validated most recent copy scanned in chart (See row information)     Chief Complaint  Patient presents with   Medical Management of Chronic Issues    HPI:  Pt is a 89 y.o. female seen today for medical management of chronic diseases.    She currently resides on the  skilled nursing unit at [facility]. PMH: hypertension, hyperlipidemia, macular degeneration, mild cognitive impairment, recurrent UTI, GERD, H/o TIA.    Osteoarthritis- left shoulder and right ankle most bothersome, she is working with PT/OT at this time> denies increased pain after sessions, remains on tylenol prn HTN- BUN/creat 24/0.6 03/05/2023, remains on metoprolol TIA- per chart review since 2014, not followed by neurology, remains on Plavix Recurrent UTI- followed by urology, remains on Keflex Unstable gait- ambulates with wheelchair, 1+ assist with ambulation, working with PT at this time Hearing loss- awaiting [facility] consult, completed Debrox and flush last month Senile osteoporosis- DEXA 2018, t score -2.3, remains on vitamin D  No recent falls or injuries.   Recent blood pressures:  01/14- 151/85  01/07- 153/95  01/07- 147/86   Recent weights:  01/01- 171.1 lbs  12/01- 166.1 lbs  11/01- 162 lbs   Past Medical History:  Diagnosis Date   Cervicalgia    Chest pain at rest 01/07/2012   Displacement of cervical intervertebral disc without myelopathy    Diverticulosis of colon (without mention of hemorrhage)    Female stress incontinence    GERD (gastroesophageal reflux disease)    no peds occasional pepcid   Inguinal hernia without mention of obstruction or gangrene, unilateral or unspecified, (not specified as recurrent)    Internal hemorrhoids without mention of complication    Lumbago    Macular degeneration (senile) of retina, unspecified    Osteoarthrosis, unspecified whether generalized or localized, unspecified site    Other and unspecified hyperlipidemia    Pain in joint, hand    Palpitations    Reflux esophagitis    Scoliosis (and  kyphoscoliosis), idiopathic    Senile osteoporosis    Skin disorder    Thyroid disease    TIA (transient ischemic attack)    Unruptured popliteal cyst 06/24/2014   Right knee    Unspecified essential hypertension     Unspecified glaucoma(365.9)    Unspecified hypothyroidism    Unspecified vitamin D deficiency    Past Surgical History:  Procedure Laterality Date   ABDOMINAL HYSTERECTOMY  1975   Dr Dewaine Conger   APPENDECTOMY  1988   cardiolite myocardial perfusion study     DOPPLER ECHOCARDIOGRAPHY     EYE SURGERY Bilateral 2009   cataract removed right eye, Dr Emmit Pomfret   FOOT SURGERY  august 2013   Hewitt, MD   INGUINAL HERNIA REPAIR Bilateral    w/mesh   INGUINAL HERNIA REPAIR Left 08/03/2017   Procedure: LAPAROSCOPIC LEFT INGUINAL HERNIA REPAIR WITH MESH;  Surgeon: Gaynelle Adu, MD;  Location: [facility] OR;  Service: General;  Laterality: Left;   INGUINAL HERNIA REPAIR Right 08/03/2017   Procedure: HERNIA REPAIR RIGHT INGUINAL ADULT WITH MESH;  Surgeon: Gaynelle Adu, MD;  Location: [facility] OR;  Service: General;  Laterality: Right;   INGUINAL HERNIA REPAIR     Dr. Andrey Campanile 04-24-18   INGUINAL HERNIA REPAIR Right 04/24/2018   Procedure: DIAGNOSTIC LAPAROSCOPIC, OPENREPAIR OF RECURRENT RIGHT INGUINAL HERNIA WITH MESH ERAS PATHWAY;  Surgeon: Gaynelle Adu, MD;  Location: [facility] ORS;  Service: General;  Laterality: Right;   INSERTION OF MESH Bilateral 08/03/2017   Procedure: INSERTION OF MESH;  Surgeon: Gaynelle Adu, MD;  Location: [facility] OR;  Service: General;  Laterality: Bilateral;   NM MYOVIEW LTD     negative   TONSILLECTOMY  1937   TOOTH EXTRACTION  09/16/13   Dr Randel Books    Allergies  Allergen Reactions   Trimethoprim Other (See Comments)    Headaches/ ear pressure    Erythromycin Other (See Comments)    UNSPECIFIED REACTION    Macrodantin [Nitrofurantoin Macrocrystal] Other (See Comments)    UNSPECIFIED REACTION    Other     Honeydew Melon   Penicillins Other (See Comments)    UNSPECIFIED REACTION  Has patient had a PCN reaction causing immediate rash, facial/tongue/throat swelling, SOB or lightheadedness with hypotension: Unknown Has patient had a PCN reaction causing severe rash involving mucus membranes or skin  necrosis: Unknown Has patient had a PCN reaction that required hospitalization: No Has patient had a PCN reaction occurring within the last 10 years: No If all of the above answers are "NO", then may proceed with Cephalosporin use.   Sulfa Antibiotics Other (See Comments)    UNSPECIFIED REACTION    Latex Itching    Outpatient Encounter Medications as of 06/01/2023  Medication Sig   acetaminophen (TYLENOL) 500 MG tablet Take 500 mg by mouth 2 (two) times daily as needed for mild pain or headache.    cephALEXin (KEFLEX) 250 MG capsule Take 250 mg by mouth daily.   Cholecalciferol (VITAMIN D3) 50 MCG (2000 UT) TABS Take by mouth.   clopidogrel (PLAVIX) 75 MG tablet TAKE ONE TABLET BY MOUTH DAILY   Cranberry-Vitamin C-Probiotic (AZO CRANBERRY PO) Take 1 tablet by mouth daily.   metoprolol succinate (TOPROL-XL) 25 MG 24 hr tablet Take one tablet by mouth once daily to regulate heart and control blood pressure.   Multiple Vitamins-Minerals (PRESERVISION AREDS 2 PO) Take 1 capsule by mouth 2 (two) times daily.    vitamin C (ASCORBIC ACID) 500 MG tablet Take 500 mg by mouth daily with  supper.    Zinc Oxide 10 % OINT Apply 1 Application topically as needed.   No facility-administered encounter medications on file as of 06/01/2023.    Review of Systems  Constitutional:  Negative for activity change and appetite change.  HENT:  Positive for hearing loss. Negative for sore throat and trouble swallowing.   Eyes:  Negative for visual disturbance.  Respiratory:  Negative for cough and shortness of breath.   Cardiovascular:  Negative for chest pain and leg swelling.  Gastrointestinal:  Negative for abdominal distention and abdominal pain.  Genitourinary:  Negative for dysuria, frequency and hematuria.  Musculoskeletal:  Positive for arthralgias and gait problem.  Skin:  Negative for wound.  Neurological:  Positive for weakness. Negative for dizziness and headaches.  Psychiatric/Behavioral:  Negative  for confusion and dysphoric mood. The patient is not nervous/anxious.     Immunization History  Administered Date(s) Administered   Fluad Quad(high Dose 65+) 02/04/2019, 03/01/2022   Influenza Whole 02/24/2011, 02/12/2012   Influenza, High Dose Seasonal PF 02/01/2017, 02/25/2018, 02/11/2020, 03/13/2023   Influenza,inj,Quad PF,6+ Mos 02/20/2013, 02/10/2014, 02/19/2015, 02/18/2016   Influenza-Unspecified 02/16/2021   Moderna Covid-19 Fall Seasonal Vaccine 27yrs & older 02/09/2022, 03/21/2023   Moderna Sars-Covid-2 Vaccination 05/19/2019, 06/16/2019, 03/29/2020, 09/27/2020   Pneumococcal Conjugate-13 06/24/2014   Pneumococcal Polysaccharide-23 04/12/1998   Td 02/21/1996, 07/23/2003   Tdap 03/20/2010   Zoster Recombinant(Shingrix) 09/19/2016, 12/02/2016   Zoster, Live 09/15/2005   Pertinent  Health Maintenance Due  Topic Date Due   INFLUENZA VACCINE  Completed   DEXA SCAN  Completed      07/31/2022   11:27 AM 08/23/2022    1:05 PM 09/06/2022    2:27 PM 12/27/2022    2:15 PM 04/18/2023    4:49 PM  Fall Risk  Falls in the past year? 1 1 1  0 0  Was there an injury with Fall? 1 0 1 0 0  Fall Risk Category Calculator 2 1 2  0 0  Patient at Risk for Falls Due to Impaired balance/gait;Impaired mobility History of fall(s) History of fall(s);Impaired balance/gait;Impaired mobility No Fall Risks History of fall(s);Impaired balance/gait;Impaired mobility  Fall risk Follow up Falls evaluation completed;Education provided;Falls prevention discussed Falls evaluation completed Falls evaluation completed;Education provided;Falls prevention discussed Falls evaluation completed;Education provided;Falls prevention discussed Falls evaluation completed;Education provided;Falls prevention discussed   Functional Status Survey:    Vitals:   06/01/23 1051  BP: (!) 151/85  Pulse: 65  Resp: 20  Temp: (!) 96.5 F (35.8 C)  SpO2: 96%  Weight: 171 lb 1.6 oz (77.6 kg)  Height: 5\' 4"  (1.626 m)   Body mass  index is 29.37 kg/m. Physical Exam Vitals reviewed.  Constitutional:      General: She is not in acute distress. HENT:     Head: Normocephalic.     Right Ear: There is no impacted cerumen.     Left Ear: There is no impacted cerumen.     Nose: Nose normal.     Mouth/Throat:     Mouth: Mucous membranes are moist.  Eyes:     General:        Right eye: No discharge.        Left eye: No discharge.  Cardiovascular:     Rate and Rhythm: Normal rate and regular rhythm.     Pulses: Normal pulses.     Heart sounds: Normal heart sounds.  Pulmonary:     Effort: Pulmonary effort is normal.     Breath sounds: Normal breath sounds.  Abdominal:     General: Bowel sounds are normal. There is no distension.     Palpations: Abdomen is soft.     Tenderness: There is no abdominal tenderness.  Musculoskeletal:     Cervical back: Neck supple.     Right lower leg: No edema.     Left lower leg: No edema.  Skin:    General: Skin is warm.     Capillary Refill: Capillary refill takes less than 2 seconds.  Neurological:     General: No focal deficit present.     Mental Status: She is alert and oriented to person, place, and time.     Motor: Weakness present.     Gait: Gait abnormal.  Psychiatric:        Mood and Affect: Mood normal.     Labs reviewed: Recent Labs    07/26/22 1715 10/31/22 0000 03/05/23 0000  NA 139 141 140  K 4.2 3.7 4.2  CL 102 104 105  CO2 27 24* 27*  GLUCOSE 100*  --   --   BUN 18 29* 24*  CREATININE 0.74 0.8 0.6  CALCIUM 9.8 9.5 8.8   Recent Labs    10/31/22 0000 03/05/23 0000  AST 21 14  ALT 18 16  ALKPHOS 71 71  ALBUMIN 4.1 3.4*   Recent Labs    07/26/22 1715 10/31/22 0000 03/05/23 0000  WBC 7.0 10.3 7.4  NEUTROABS 4.1  --  4,151.00  HGB 13.6 13.8 12.2  HCT 41.0 42 37  MCV 92.6  --   --   PLT 207 234 219   Lab Results  Component Value Date   TSH 3.20 10/31/2022   Lab Results  Component Value Date   HGBA1C 5.3 05/02/2013   Lab Results   Component Value Date   CHOL 188 07/10/2019   HDL 75 07/10/2019   LDLCALC 94 07/10/2019   TRIG 96 07/10/2019   CHOLHDL 2.5 07/10/2019    Significant Diagnostic Results in last 30 days:  No results found.  Assessment/Plan 1. Primary osteoarthritis involving multiple joints (Primary) - left shoulder and right ankle most bothersome - doing well with PT/OT - cont tylenol prn  2. Essential hypertension - controlled - cont metoprolol  3. TIA (transient ischemic attack) - no recent episodes - cont plavix   4. Recurrent UTI - no recent episodes - cont keflex  5. Unstable gait - no recent falls  - cont PT/OT   6. Bilateral hearing loss, unspecified hearing loss type - followed by [facility] - plans to get hearing aids soon   7. Senile osteoporosis - cont vitamin D    Family/ staff Communication: plan discussed with patient and nurse  Labs/tests ordered:  none

## 2023-06-04 DIAGNOSIS — R41841 Cognitive communication deficit: Secondary | ICD-10-CM | POA: Diagnosis not present

## 2023-06-04 DIAGNOSIS — R2681 Unsteadiness on feet: Secondary | ICD-10-CM | POA: Diagnosis not present

## 2023-06-04 DIAGNOSIS — M6281 Muscle weakness (generalized): Secondary | ICD-10-CM | POA: Diagnosis not present

## 2023-06-04 DIAGNOSIS — R4701 Aphasia: Secondary | ICD-10-CM | POA: Diagnosis not present

## 2023-06-04 DIAGNOSIS — Z9181 History of falling: Secondary | ICD-10-CM | POA: Diagnosis not present

## 2023-06-04 DIAGNOSIS — R278 Other lack of coordination: Secondary | ICD-10-CM | POA: Diagnosis not present

## 2023-06-05 DIAGNOSIS — M6281 Muscle weakness (generalized): Secondary | ICD-10-CM | POA: Diagnosis not present

## 2023-06-05 DIAGNOSIS — R278 Other lack of coordination: Secondary | ICD-10-CM | POA: Diagnosis not present

## 2023-06-05 DIAGNOSIS — Z9181 History of falling: Secondary | ICD-10-CM | POA: Diagnosis not present

## 2023-06-05 DIAGNOSIS — R2681 Unsteadiness on feet: Secondary | ICD-10-CM | POA: Diagnosis not present

## 2023-06-05 DIAGNOSIS — R41841 Cognitive communication deficit: Secondary | ICD-10-CM | POA: Diagnosis not present

## 2023-06-05 DIAGNOSIS — R4701 Aphasia: Secondary | ICD-10-CM | POA: Diagnosis not present

## 2023-06-06 DIAGNOSIS — R4701 Aphasia: Secondary | ICD-10-CM | POA: Diagnosis not present

## 2023-06-06 DIAGNOSIS — R2681 Unsteadiness on feet: Secondary | ICD-10-CM | POA: Diagnosis not present

## 2023-06-06 DIAGNOSIS — R41841 Cognitive communication deficit: Secondary | ICD-10-CM | POA: Diagnosis not present

## 2023-06-06 DIAGNOSIS — R278 Other lack of coordination: Secondary | ICD-10-CM | POA: Diagnosis not present

## 2023-06-06 DIAGNOSIS — Z9181 History of falling: Secondary | ICD-10-CM | POA: Diagnosis not present

## 2023-06-06 DIAGNOSIS — M6281 Muscle weakness (generalized): Secondary | ICD-10-CM | POA: Diagnosis not present

## 2023-06-07 DIAGNOSIS — Z9181 History of falling: Secondary | ICD-10-CM | POA: Diagnosis not present

## 2023-06-07 DIAGNOSIS — R2681 Unsteadiness on feet: Secondary | ICD-10-CM | POA: Diagnosis not present

## 2023-06-07 DIAGNOSIS — R4701 Aphasia: Secondary | ICD-10-CM | POA: Diagnosis not present

## 2023-06-07 DIAGNOSIS — R278 Other lack of coordination: Secondary | ICD-10-CM | POA: Diagnosis not present

## 2023-06-07 DIAGNOSIS — R41841 Cognitive communication deficit: Secondary | ICD-10-CM | POA: Diagnosis not present

## 2023-06-07 DIAGNOSIS — M6281 Muscle weakness (generalized): Secondary | ICD-10-CM | POA: Diagnosis not present

## 2023-06-11 DIAGNOSIS — M6281 Muscle weakness (generalized): Secondary | ICD-10-CM | POA: Diagnosis not present

## 2023-06-11 DIAGNOSIS — Z9181 History of falling: Secondary | ICD-10-CM | POA: Diagnosis not present

## 2023-06-11 DIAGNOSIS — R4701 Aphasia: Secondary | ICD-10-CM | POA: Diagnosis not present

## 2023-06-11 DIAGNOSIS — R2681 Unsteadiness on feet: Secondary | ICD-10-CM | POA: Diagnosis not present

## 2023-06-11 DIAGNOSIS — R278 Other lack of coordination: Secondary | ICD-10-CM | POA: Diagnosis not present

## 2023-06-11 DIAGNOSIS — R41841 Cognitive communication deficit: Secondary | ICD-10-CM | POA: Diagnosis not present

## 2023-06-13 DIAGNOSIS — R4701 Aphasia: Secondary | ICD-10-CM | POA: Diagnosis not present

## 2023-06-13 DIAGNOSIS — R278 Other lack of coordination: Secondary | ICD-10-CM | POA: Diagnosis not present

## 2023-06-13 DIAGNOSIS — R2681 Unsteadiness on feet: Secondary | ICD-10-CM | POA: Diagnosis not present

## 2023-06-13 DIAGNOSIS — R41841 Cognitive communication deficit: Secondary | ICD-10-CM | POA: Diagnosis not present

## 2023-06-13 DIAGNOSIS — Z9181 History of falling: Secondary | ICD-10-CM | POA: Diagnosis not present

## 2023-06-13 DIAGNOSIS — M6281 Muscle weakness (generalized): Secondary | ICD-10-CM | POA: Diagnosis not present

## 2023-06-14 DIAGNOSIS — R2681 Unsteadiness on feet: Secondary | ICD-10-CM | POA: Diagnosis not present

## 2023-06-14 DIAGNOSIS — R41841 Cognitive communication deficit: Secondary | ICD-10-CM | POA: Diagnosis not present

## 2023-06-14 DIAGNOSIS — R278 Other lack of coordination: Secondary | ICD-10-CM | POA: Diagnosis not present

## 2023-06-14 DIAGNOSIS — M6281 Muscle weakness (generalized): Secondary | ICD-10-CM | POA: Diagnosis not present

## 2023-06-14 DIAGNOSIS — R4701 Aphasia: Secondary | ICD-10-CM | POA: Diagnosis not present

## 2023-06-14 DIAGNOSIS — Z9181 History of falling: Secondary | ICD-10-CM | POA: Diagnosis not present

## 2023-06-15 DIAGNOSIS — H353231 Exudative age-related macular degeneration, bilateral, with active choroidal neovascularization: Secondary | ICD-10-CM | POA: Diagnosis not present

## 2023-06-15 DIAGNOSIS — H35723 Serous detachment of retinal pigment epithelium, bilateral: Secondary | ICD-10-CM | POA: Diagnosis not present

## 2023-06-15 DIAGNOSIS — H35363 Drusen (degenerative) of macula, bilateral: Secondary | ICD-10-CM | POA: Diagnosis not present

## 2023-06-18 DIAGNOSIS — R4701 Aphasia: Secondary | ICD-10-CM | POA: Diagnosis not present

## 2023-06-18 DIAGNOSIS — R262 Difficulty in walking, not elsewhere classified: Secondary | ICD-10-CM | POA: Diagnosis not present

## 2023-06-18 DIAGNOSIS — R278 Other lack of coordination: Secondary | ICD-10-CM | POA: Diagnosis not present

## 2023-06-18 DIAGNOSIS — R41841 Cognitive communication deficit: Secondary | ICD-10-CM | POA: Diagnosis not present

## 2023-06-18 DIAGNOSIS — M6281 Muscle weakness (generalized): Secondary | ICD-10-CM | POA: Diagnosis not present

## 2023-06-19 DIAGNOSIS — R278 Other lack of coordination: Secondary | ICD-10-CM | POA: Diagnosis not present

## 2023-06-19 DIAGNOSIS — M6281 Muscle weakness (generalized): Secondary | ICD-10-CM | POA: Diagnosis not present

## 2023-06-19 DIAGNOSIS — R4701 Aphasia: Secondary | ICD-10-CM | POA: Diagnosis not present

## 2023-06-19 DIAGNOSIS — R41841 Cognitive communication deficit: Secondary | ICD-10-CM | POA: Diagnosis not present

## 2023-06-19 DIAGNOSIS — R262 Difficulty in walking, not elsewhere classified: Secondary | ICD-10-CM | POA: Diagnosis not present

## 2023-06-20 DIAGNOSIS — R278 Other lack of coordination: Secondary | ICD-10-CM | POA: Diagnosis not present

## 2023-06-20 DIAGNOSIS — R41841 Cognitive communication deficit: Secondary | ICD-10-CM | POA: Diagnosis not present

## 2023-06-20 DIAGNOSIS — R4701 Aphasia: Secondary | ICD-10-CM | POA: Diagnosis not present

## 2023-06-20 DIAGNOSIS — M6281 Muscle weakness (generalized): Secondary | ICD-10-CM | POA: Diagnosis not present

## 2023-06-20 DIAGNOSIS — R262 Difficulty in walking, not elsewhere classified: Secondary | ICD-10-CM | POA: Diagnosis not present

## 2023-06-20 DIAGNOSIS — H353211 Exudative age-related macular degeneration, right eye, with active choroidal neovascularization: Secondary | ICD-10-CM | POA: Diagnosis not present

## 2023-06-21 ENCOUNTER — Non-Acute Institutional Stay (SKILLED_NURSING_FACILITY): Payer: Self-pay | Admitting: Adult Health

## 2023-06-21 ENCOUNTER — Encounter: Payer: Self-pay | Admitting: Adult Health

## 2023-06-21 DIAGNOSIS — U071 COVID-19: Secondary | ICD-10-CM | POA: Diagnosis not present

## 2023-06-21 DIAGNOSIS — R278 Other lack of coordination: Secondary | ICD-10-CM | POA: Diagnosis not present

## 2023-06-21 DIAGNOSIS — I10 Essential (primary) hypertension: Secondary | ICD-10-CM

## 2023-06-21 DIAGNOSIS — R4701 Aphasia: Secondary | ICD-10-CM | POA: Diagnosis not present

## 2023-06-21 DIAGNOSIS — M6281 Muscle weakness (generalized): Secondary | ICD-10-CM | POA: Diagnosis not present

## 2023-06-21 DIAGNOSIS — R262 Difficulty in walking, not elsewhere classified: Secondary | ICD-10-CM | POA: Diagnosis not present

## 2023-06-21 DIAGNOSIS — R41841 Cognitive communication deficit: Secondary | ICD-10-CM | POA: Diagnosis not present

## 2023-06-21 DIAGNOSIS — G459 Transient cerebral ischemic attack, unspecified: Secondary | ICD-10-CM | POA: Diagnosis not present

## 2023-06-21 MED ORDER — ALBUTEROL SULFATE HFA 108 (90 BASE) MCG/ACT IN AERS: 2.0000 | INHALATION_SPRAY | Freq: Four times a day (QID) | RESPIRATORY_TRACT | 0 refills | Status: AC | PRN

## 2023-06-21 MED ORDER — GUAIFENESIN ER 600 MG PO TB12: 600.0000 mg | ORAL_TABLET | Freq: Two times a day (BID) | ORAL | 0 refills | Status: AC

## 2023-06-21 MED ORDER — NIRMATRELVIR/RITONAVIR (PAXLOVID) TABLET (RENAL DOSING): 2.0000 | ORAL_TABLET | Freq: Two times a day (BID) | ORAL | 0 refills | Status: AC

## 2023-06-21 MED ORDER — MOLNUPIRAVIR EUA 200MG CAPSULE: 4.0000 | ORAL_CAPSULE | Freq: Two times a day (BID) | ORAL | 0 refills | Status: DC

## 2023-06-21 NOTE — Progress Notes (Signed)
 Location:  [facility] Room Number: N14-A [name]:  SNF (31) Provider:  Medina-Vargas, Madlynn Lundeen, DNP, FNP-BC  Patient Care Team: Charlanne Fredia CROME, MD as PCP - General (Internal Medicine) Court Dorn PARAS, MD as PCP - Cardiology (Cardiology) Celestia Agent, MD (Inactive) as Consulting Physician (Gastroenterology) Nicholaus Tanda CROME DOUGLAS, MD as Attending Physician (Urology) Court Dorn PARAS, MD as Consulting Physician (Cardiology) Ethyl Lonni BRAVO, MD (Inactive) as Consulting Physician (Otolaryngology) Clare Senior, DDS (Dentistry) Rosan Credit, MD as Consulting Physician (Ophthalmology) Shari Easter, MD as Consulting Physician (Orthopedic Surgery) Tanda Locus, MD as Consulting Physician (General Surgery)  Extended Emergency Contact Information Primary Emergency Contact: Bertrum CHRISTELLA Gibson Address: [address]          [place], [place] [address] [place] Home Phone: [phone] Work Phone: [phone] Mobile Phone: [phone] Relation: Daughter Secondary Emergency Contact: Bertrum Piles Address: [address] [place] [address] [place] Home Phone: [phone] Mobile Phone: [phone] Relation: Son  Code Status:  DNR  Goals of care: Advanced Directive information    06/21/2023   11:20 AM  Advanced Directives  Does Patient Have a Medical Advance Directive? Yes  Type of Estate Agent of [place];Living will;Out of facility DNR (pink MOST or yellow form)  Does patient want to make changes to medical advance directive? No - Patient declined  Copy of Healthcare Power of Attorney in Chart? Yes - validated most recent copy scanned in chart (See row information)  Pre-existing out of facility DNR order (yellow form or pink MOST form) Yellow form placed in chart (order not valid for inpatient use)     Chief Complaint  Patient presents with   Acute Visit    nasal  congestion    HPI:  Pt is a 89 y.o. female seen today for an acute visit regarding nasal congestion. She is a resident of [id number]. She was noted to have nasal congestion and wheezing. She tested positive for COVID-19. OT When she was transferred from bed to wheelchair, she started coughing and slight wheezing. Latest GFR 86, 03/05/23.  She takes Plavix  for TIA.  BP 138/84, takes Metoprolol  succinate 25 mg daily for hypertension.   Past Medical History:  Diagnosis Date   Cervicalgia    Chest pain at rest 01/07/2012   Displacement of cervical intervertebral disc without myelopathy    Diverticulosis of colon (without mention of hemorrhage)    Female stress incontinence    GERD (gastroesophageal reflux disease)    no peds occasional pepcid   Inguinal hernia without mention of obstruction or gangrene, unilateral or unspecified, (not specified as recurrent)    Internal hemorrhoids without mention of complication    Lumbago    Macular degeneration (senile) of retina, unspecified    Osteoarthrosis, unspecified whether generalized or localized, unspecified site    Other and unspecified hyperlipidemia    Pain in joint, hand    Palpitations    Reflux esophagitis    Scoliosis (and kyphoscoliosis), idiopathic    Senile osteoporosis    Skin disorder    Thyroid  disease    TIA (transient ischemic attack)    Unruptured popliteal cyst 06/24/2014   Right knee    Unspecified essential hypertension    Unspecified glaucoma(365.9)    Unspecified hypothyroidism    Unspecified vitamin D  deficiency    Past Surgical History:  Procedure Laterality Date   ABDOMINAL HYSTERECTOMY  1975  Dr Sharron   APPENDECTOMY  1988   cardiolite myocardial perfusion study     DOPPLER ECHOCARDIOGRAPHY     EYE SURGERY Bilateral 2009   cataract removed right eye, Dr Carrie   FOOT SURGERY  august 2013   Hewitt, MD   INGUINAL HERNIA REPAIR Bilateral    w/mesh   INGUINAL HERNIA REPAIR Left 08/03/2017    Procedure: LAPAROSCOPIC LEFT INGUINAL HERNIA REPAIR WITH MESH;  Surgeon: Tanda Locus, MD;  Location: [facility] OR;  Service: General;  Laterality: Left;   INGUINAL HERNIA REPAIR Right 08/03/2017   Procedure: HERNIA REPAIR RIGHT INGUINAL ADULT WITH MESH;  Surgeon: Tanda Locus, MD;  Location: [facility] OR;  Service: General;  Laterality: Right;   INGUINAL HERNIA REPAIR     Dr. Tanda 04-24-18   INGUINAL HERNIA REPAIR Right 04/24/2018   Procedure: DIAGNOSTIC LAPAROSCOPIC, OPENREPAIR OF RECURRENT RIGHT INGUINAL HERNIA WITH MESH ERAS PATHWAY;  Surgeon: Tanda Locus, MD;  Location: [facility] ORS;  Service: General;  Laterality: Right;   INSERTION OF MESH Bilateral 08/03/2017   Procedure: INSERTION OF MESH;  Surgeon: Tanda Locus, MD;  Location: [facility] OR;  Service: General;  Laterality: Bilateral;   NM MYOVIEW  LTD     negative   TONSILLECTOMY  1937   TOOTH EXTRACTION  09/16/13   Dr Maryjean    Allergies  Allergen Reactions   Trimethoprim Other (See Comments)    Headaches/ ear pressure    Erythromycin Other (See Comments)    UNSPECIFIED REACTION    Macrodantin [Nitrofurantoin Macrocrystal] Other (See Comments)    UNSPECIFIED REACTION    Other     Honeydew Melon   Penicillins Other (See Comments)    UNSPECIFIED REACTION  Has patient had a PCN reaction causing immediate rash, facial/tongue/throat swelling, SOB or lightheadedness with hypotension: Unknown Has patient had a PCN reaction causing severe rash involving mucus membranes or skin necrosis: Unknown Has patient had a PCN reaction that required hospitalization: No Has patient had a PCN reaction occurring within the last 10 years: No If all of the above answers are NO, then may proceed with Cephalosporin use.   Sulfa Antibiotics Other (See Comments)    UNSPECIFIED REACTION    Latex Itching    Outpatient Encounter Medications as of 06/21/2023  Medication Sig   acetaminophen  (TYLENOL ) 500 MG tablet Take 500 mg by mouth 2 (two) times daily as needed for mild  pain or headache.    cephALEXin  (KEFLEX ) 250 MG capsule Take 250 mg by mouth daily.   Cholecalciferol  (VITAMIN D3) 50 MCG (2000 UT) TABS Take by mouth.   clopidogrel  (PLAVIX ) 75 MG tablet TAKE ONE TABLET BY MOUTH DAILY   Cranberry-Vitamin C-Probiotic (AZO CRANBERRY PO) Take 1 tablet by mouth daily.   Menthol, Topical Analgesic, (BIOFREEZE ROLL-ON) 4 % GEL Apply 1 Application topically in the morning and at bedtime.   metoprolol  succinate (TOPROL -XL) 25 MG 24 hr tablet Take one tablet by mouth once daily to regulate heart and control blood pressure.   Multiple Vitamins-Minerals (PRESERVISION AREDS 2 PO) Take 1 capsule by mouth 2 (two) times daily.    vitamin C (ASCORBIC ACID) 500 MG tablet Take 500 mg by mouth daily with supper.    Zinc Oxide 10 % OINT Apply 1 Application topically as needed.   No facility-administered encounter medications on file as of 06/21/2023.    Review of Systems  Constitutional:  Negative for appetite change, chills, fatigue and fever.  HENT:  Negative for congestion, hearing loss, rhinorrhea and sore throat.  Eyes: Negative.   Respiratory:  Positive for cough and wheezing. Negative for shortness of breath.   Cardiovascular:  Negative for chest pain, palpitations and leg swelling.  Gastrointestinal:  Negative for abdominal pain, constipation, diarrhea, nausea and vomiting.  Genitourinary:  Negative for dysuria.  Musculoskeletal:  Negative for arthralgias, back pain and myalgias.  Skin:  Negative for color change, rash and wound.  Neurological:  Negative for dizziness, weakness and headaches.  Psychiatric/Behavioral:  Negative for behavioral problems. The patient is not nervous/anxious.        Immunization History  Administered Date(s) Administered   Fluad Quad(high Dose 65+) 02/04/2019, 03/01/2022   Influenza Whole 02/24/2011, 02/12/2012   Influenza, High Dose Seasonal PF 02/01/2017, 02/25/2018, 02/11/2020, 03/13/2023   Influenza,inj,Quad PF,6+ Mos  02/20/2013, 02/10/2014, 02/19/2015, 02/18/2016   Influenza-Unspecified 02/16/2021   Moderna Covid-19 Fall Seasonal Vaccine 73yrs & older 02/09/2022, 03/21/2023   Moderna Sars-Covid-2 Vaccination 05/19/2019, 06/16/2019, 03/29/2020, 09/27/2020   Pneumococcal Conjugate-13 06/24/2014   Pneumococcal Polysaccharide-23 04/12/1998   Td 02/21/1996, 07/23/2003   Tdap 03/20/2010   Zoster Recombinant(Shingrix) 09/19/2016, 12/02/2016   Zoster, Live 09/15/2005   Pertinent  Health Maintenance Due  Topic Date Due   INFLUENZA VACCINE  Completed   DEXA SCAN  Completed      07/31/2022   11:27 AM 08/23/2022    1:05 PM 09/06/2022    2:27 PM 12/27/2022    2:15 PM 04/18/2023    4:49 PM  Fall Risk  Falls in the past year? 1 1 1  0 0  Was there an injury with Fall? 1 0 1 0 0  Fall Risk Category Calculator 2 1 2  0 0  Patient at Risk for Falls Due to Impaired balance/gait;Impaired mobility History of fall(s) History of fall(s);Impaired balance/gait;Impaired mobility No Fall Risks History of fall(s);Impaired balance/gait;Impaired mobility  Fall risk Follow up Falls evaluation completed;Education provided;Falls prevention discussed Falls evaluation completed Falls evaluation completed;Education provided;Falls prevention discussed Falls evaluation completed;Education provided;Falls prevention discussed Falls evaluation completed;Education provided;Falls prevention discussed     Vitals:   06/21/23 1104  BP: 138/84  Pulse: 73  Resp: 20  Temp: (!) 97.4 F (36.3 C)  SpO2: 94%  Weight: 171 lb (77.6 kg)  Height: 5' (1.524 m)   Body mass index is 33.4 kg/m.  Physical Exam Constitutional:      General: She is not in acute distress.    Appearance: She is obese.  HENT:     Head: Normocephalic and atraumatic.     Nose: Nose normal.     Mouth/Throat:     Mouth: Mucous membranes are moist.  Eyes:     Conjunctiva/sclera: Conjunctivae normal.  Cardiovascular:     Rate and Rhythm: Normal rate and regular  rhythm.  Pulmonary:     Effort: Pulmonary effort is normal.     Breath sounds: Normal breath sounds.  Abdominal:     General: Bowel sounds are normal.     Palpations: Abdomen is soft.  Musculoskeletal:        General: Normal range of motion.     Cervical back: Normal range of motion.  Skin:    General: Skin is warm and dry.  Neurological:     Mental Status: She is alert.     Comments: sleepy  Psychiatric:        Mood and Affect: Mood normal.        Behavior: Behavior normal.       Labs reviewed: Recent Labs    07/26/22 1715 10/31/22 0000 03/05/23  0000  NA 139 141 140  K 4.2 3.7 4.2  CL 102 104 105  CO2 27 24* 27*  GLUCOSE 100*  --   --   BUN 18 29* 24*  CREATININE 0.74 0.8 0.6  CALCIUM  9.8 9.5 8.8   Recent Labs    10/31/22 0000 03/05/23 0000  AST 21 14  ALT 18 16  ALKPHOS 71 71  ALBUMIN 4.1 3.4*   Recent Labs    07/26/22 1715 10/31/22 0000 03/05/23 0000  WBC 7.0 10.3 7.4  NEUTROABS 4.1  --  4,151.00  HGB 13.6 13.8 12.2  HCT 41.0 42 37  MCV 92.6  --   --   PLT 207 234 219   Lab Results  Component Value Date   TSH 3.20 10/31/2022   Lab Results  Component Value Date   HGBA1C 5.3 05/02/2013   Lab Results  Component Value Date   CHOL 188 07/10/2019   HDL 75 07/10/2019   LDLCALC 94 07/10/2019   TRIG 96 07/10/2019   CHOLHDL 2.5 07/10/2019    Significant Diagnostic Results in last 30 days:  No results found.  Assessment/Plan  1. COVID-19 (Primary) -  discussed with daughter regarding Molnupiravir  treatment for COVID-19 to which she agreed. However, insurance does not cover it but covers Paxlovid  -  Called charge nurse and changed order to Paxlovid  instead Vitamin C 500 mg daily and Vitamin D3 50 mcg daily -  continue - albuterol  (VENTOLIN  HFA) 108 (90 Base) MCG/ACT inhaler; Inhale 2 puffs into the lungs every 6 (six) hours as needed for wheezing or shortness of breath.  Dispense: 8 g; Refill: 0 - guaiFENesin  (MUCINEX ) 600 MG 12 hr tablet;  Take 1 tablet (600 mg total) by mouth 2 (two) times daily for 7 days.  Dispense: 14 tablet; Refill: 0 - nirmatrelvir /ritonavir , renal dosing, (PAXLOVID ) 10 x 150 MG & 10 x 100MG  TABS; Take 2 tablets by mouth 2 (two) times daily for 5 days. (Take nirmatrelvir  150 mg one tablet twice daily for 5 days and ritonavir  100 mg one tablet twice daily for 5 days) Patient GFR is 86, 03/05/23  Dispense: 20 tablet; Refill: 0  2. TIA (transient ischemic attack) -  stable -  continue Plavix   3. Essential hypertension -  BP stable -  continue Metoprolol  succinate     Family/ staff Communication: Discussed plan of care with resident, daughter and charge nurse.  Labs/tests ordered:  COVID-19 test    Arilyn Brierley Medina-Vargas, DNP, MSN, FNP-BC [facility] [phone] (Monday-Friday 8:00 a.m. - 5:00 p.m.) [phone] (after hours)

## 2023-06-27 DIAGNOSIS — M6281 Muscle weakness (generalized): Secondary | ICD-10-CM | POA: Diagnosis not present

## 2023-06-27 DIAGNOSIS — R4701 Aphasia: Secondary | ICD-10-CM | POA: Diagnosis not present

## 2023-06-27 DIAGNOSIS — R41841 Cognitive communication deficit: Secondary | ICD-10-CM | POA: Diagnosis not present

## 2023-06-27 DIAGNOSIS — R278 Other lack of coordination: Secondary | ICD-10-CM | POA: Diagnosis not present

## 2023-06-27 DIAGNOSIS — R262 Difficulty in walking, not elsewhere classified: Secondary | ICD-10-CM | POA: Diagnosis not present

## 2023-06-28 DIAGNOSIS — R41841 Cognitive communication deficit: Secondary | ICD-10-CM | POA: Diagnosis not present

## 2023-06-28 DIAGNOSIS — R278 Other lack of coordination: Secondary | ICD-10-CM | POA: Diagnosis not present

## 2023-06-28 DIAGNOSIS — R4701 Aphasia: Secondary | ICD-10-CM | POA: Diagnosis not present

## 2023-06-28 DIAGNOSIS — R262 Difficulty in walking, not elsewhere classified: Secondary | ICD-10-CM | POA: Diagnosis not present

## 2023-06-28 DIAGNOSIS — M6281 Muscle weakness (generalized): Secondary | ICD-10-CM | POA: Diagnosis not present

## 2023-06-29 DIAGNOSIS — R41841 Cognitive communication deficit: Secondary | ICD-10-CM | POA: Diagnosis not present

## 2023-06-29 DIAGNOSIS — R278 Other lack of coordination: Secondary | ICD-10-CM | POA: Diagnosis not present

## 2023-06-29 DIAGNOSIS — R262 Difficulty in walking, not elsewhere classified: Secondary | ICD-10-CM | POA: Diagnosis not present

## 2023-06-29 DIAGNOSIS — M6281 Muscle weakness (generalized): Secondary | ICD-10-CM | POA: Diagnosis not present

## 2023-06-29 DIAGNOSIS — R4701 Aphasia: Secondary | ICD-10-CM | POA: Diagnosis not present

## 2023-07-02 DIAGNOSIS — R41841 Cognitive communication deficit: Secondary | ICD-10-CM | POA: Diagnosis not present

## 2023-07-02 DIAGNOSIS — R278 Other lack of coordination: Secondary | ICD-10-CM | POA: Diagnosis not present

## 2023-07-02 DIAGNOSIS — M6281 Muscle weakness (generalized): Secondary | ICD-10-CM | POA: Diagnosis not present

## 2023-07-02 DIAGNOSIS — R262 Difficulty in walking, not elsewhere classified: Secondary | ICD-10-CM | POA: Diagnosis not present

## 2023-07-02 DIAGNOSIS — R4701 Aphasia: Secondary | ICD-10-CM | POA: Diagnosis not present

## 2023-07-03 DIAGNOSIS — M6281 Muscle weakness (generalized): Secondary | ICD-10-CM | POA: Diagnosis not present

## 2023-07-03 DIAGNOSIS — L821 Other seborrheic keratosis: Secondary | ICD-10-CM | POA: Diagnosis not present

## 2023-07-03 DIAGNOSIS — L570 Actinic keratosis: Secondary | ICD-10-CM | POA: Diagnosis not present

## 2023-07-03 DIAGNOSIS — R41841 Cognitive communication deficit: Secondary | ICD-10-CM | POA: Diagnosis not present

## 2023-07-03 DIAGNOSIS — L814 Other melanin hyperpigmentation: Secondary | ICD-10-CM | POA: Diagnosis not present

## 2023-07-03 DIAGNOSIS — R4701 Aphasia: Secondary | ICD-10-CM | POA: Diagnosis not present

## 2023-07-03 DIAGNOSIS — R278 Other lack of coordination: Secondary | ICD-10-CM | POA: Diagnosis not present

## 2023-07-03 DIAGNOSIS — R262 Difficulty in walking, not elsewhere classified: Secondary | ICD-10-CM | POA: Diagnosis not present

## 2023-07-05 ENCOUNTER — Non-Acute Institutional Stay (SKILLED_NURSING_FACILITY): Payer: Self-pay | Admitting: Internal Medicine

## 2023-07-05 DIAGNOSIS — M150 Primary generalized (osteo)arthritis: Secondary | ICD-10-CM

## 2023-07-05 DIAGNOSIS — I10 Essential (primary) hypertension: Secondary | ICD-10-CM

## 2023-07-05 DIAGNOSIS — N390 Urinary tract infection, site not specified: Secondary | ICD-10-CM | POA: Diagnosis not present

## 2023-07-05 DIAGNOSIS — R4189 Other symptoms and signs involving cognitive functions and awareness: Secondary | ICD-10-CM

## 2023-07-05 DIAGNOSIS — G459 Transient cerebral ischemic attack, unspecified: Secondary | ICD-10-CM

## 2023-07-05 DIAGNOSIS — R2681 Unsteadiness on feet: Secondary | ICD-10-CM

## 2023-07-05 DIAGNOSIS — R262 Difficulty in walking, not elsewhere classified: Secondary | ICD-10-CM | POA: Diagnosis not present

## 2023-07-05 DIAGNOSIS — R4701 Aphasia: Secondary | ICD-10-CM | POA: Diagnosis not present

## 2023-07-05 DIAGNOSIS — M6281 Muscle weakness (generalized): Secondary | ICD-10-CM | POA: Diagnosis not present

## 2023-07-05 DIAGNOSIS — R278 Other lack of coordination: Secondary | ICD-10-CM | POA: Diagnosis not present

## 2023-07-05 DIAGNOSIS — R41841 Cognitive communication deficit: Secondary | ICD-10-CM | POA: Diagnosis not present

## 2023-07-05 NOTE — Progress Notes (Signed)
 [facility]  SNF (31)  Provider:   Code Status: DNR Goals of Care:     06/21/2023   11:20 AM  Advanced Directives  Does Patient Have a Medical Advance Directive? Yes  Type of Estate agent of [place];Living will;Out of facility DNR (pink MOST or yellow form)  Does patient want to make changes to medical advance directive? No - Patient declined  Copy of Healthcare Power of Attorney in Chart? Yes - validated most recent copy scanned in chart (See row information)  Pre-existing out of facility DNR order (yellow form or pink MOST form) Yellow form placed in chart (order not valid for inpatient use)     Chief Complaint  Patient presents with   Care Management    HPI: Patient is a 89 y.o. female seen today for medical management of chronic diseases.   Lives in SNF in [facility]  Patient has a history of hypertension, hyperlipidemia, macular degeneration, mild cognitive impairment, recurrent UTI, GERD, H/o TIA   Recent Covid Treated with Paxlovid Recovered No Cough or SOB or any other symptoms No Other Complains She needs help with her ADLS Also has Worsening Cognition recently Wt Readings from Last 3 Encounters:  07/05/23 171 lb (77.6 kg)  06/21/23 171 lb (77.6 kg)  06/01/23 171 lb 1.6 oz (77.6 kg)   Weight is stable Uses Wheelchair   Past Medical History:  Diagnosis Date   Cervicalgia    Chest pain at rest 01/07/2012   Displacement of cervical intervertebral disc without myelopathy    Diverticulosis of colon (without mention of hemorrhage)    Female stress incontinence    GERD (gastroesophageal reflux disease)    no peds occasional pepcid   Inguinal hernia without mention of obstruction or gangrene, unilateral or unspecified, (not specified as recurrent)    Internal hemorrhoids without mention of complication    Lumbago    Macular degeneration (senile) of retina, unspecified    Osteoarthrosis, unspecified whether  generalized or localized, unspecified site    Other and unspecified hyperlipidemia    Pain in joint, hand    Palpitations    Reflux esophagitis    Scoliosis (and kyphoscoliosis), idiopathic    Senile osteoporosis    Skin disorder    Thyroid disease    TIA (transient ischemic attack)    Unruptured popliteal cyst 06/24/2014   Right knee    Unspecified essential hypertension    Unspecified glaucoma(365.9)    Unspecified hypothyroidism    Unspecified vitamin D deficiency     Past Surgical History:  Procedure Laterality Date   ABDOMINAL HYSTERECTOMY  1975   Dr Dewaine Conger   APPENDECTOMY  1988   cardiolite myocardial perfusion study     DOPPLER ECHOCARDIOGRAPHY     EYE SURGERY Bilateral 2009   cataract removed right eye, Dr Emmit Pomfret   FOOT SURGERY  august 2013   Hewitt, MD   INGUINAL HERNIA REPAIR Bilateral    w/mesh   INGUINAL HERNIA REPAIR Left 08/03/2017   Procedure: LAPAROSCOPIC LEFT INGUINAL HERNIA REPAIR WITH MESH;  Surgeon: Gaynelle Adu, MD;  Location: [facility] OR;  Service: General;  Laterality: Left;   INGUINAL HERNIA REPAIR Right 08/03/2017   Procedure: HERNIA REPAIR RIGHT INGUINAL ADULT WITH MESH;  Surgeon: Gaynelle Adu, MD;  Location: [facility] OR;  Service: General;  Laterality: Right;   INGUINAL HERNIA REPAIR     Dr. Andrey Campanile 04-24-18   INGUINAL HERNIA REPAIR Right 04/24/2018   Procedure: DIAGNOSTIC  LAPAROSCOPIC, OPENREPAIR OF RECURRENT RIGHT INGUINAL HERNIA WITH MESH ERAS PATHWAY;  Surgeon: Gaynelle Adu, MD;  Location: [facility] ORS;  Service: General;  Laterality: Right;   INSERTION OF MESH Bilateral 08/03/2017   Procedure: INSERTION OF MESH;  Surgeon: Gaynelle Adu, MD;  Location: [facility] OR;  Service: General;  Laterality: Bilateral;   NM MYOVIEW LTD     negative   TONSILLECTOMY  1937   TOOTH EXTRACTION  09/16/13   Dr Randel Books    Allergies  Allergen Reactions   Trimethoprim Other (See Comments)    Headaches/ ear pressure    Erythromycin Other (See Comments)    UNSPECIFIED REACTION     Macrodantin [Nitrofurantoin Macrocrystal] Other (See Comments)    UNSPECIFIED REACTION    Other     Honeydew Melon   Penicillins Other (See Comments)    UNSPECIFIED REACTION  Has patient had a PCN reaction causing immediate rash, facial/tongue/throat swelling, SOB or lightheadedness with hypotension: Unknown Has patient had a PCN reaction causing severe rash involving mucus membranes or skin necrosis: Unknown Has patient had a PCN reaction that required hospitalization: No Has patient had a PCN reaction occurring within the last 10 years: No If all of the above answers are "NO", then may proceed with Cephalosporin use.   Sulfa Antibiotics Other (See Comments)    UNSPECIFIED REACTION    Latex Itching    Outpatient Encounter Medications as of 07/05/2023  Medication Sig   acetaminophen (TYLENOL) 500 MG tablet Take 500 mg by mouth 2 (two) times daily as needed for mild pain or headache.    albuterol (VENTOLIN HFA) 108 (90 Base) MCG/ACT inhaler Inhale 2 puffs into the lungs every 6 (six) hours as needed for wheezing or shortness of breath.   cephALEXin (KEFLEX) 250 MG capsule Take 250 mg by mouth daily.   Cholecalciferol (VITAMIN D3) 50 MCG (2000 UT) TABS Take by mouth.   clopidogrel (PLAVIX) 75 MG tablet TAKE ONE TABLET BY MOUTH DAILY   Cranberry-Vitamin C-Probiotic (AZO CRANBERRY PO) Take 1 tablet by mouth daily.   Menthol, Topical Analgesic, (BIOFREEZE ROLL-ON) 4 % GEL Apply 1 Application topically in the morning and at bedtime.   metoprolol succinate (TOPROL-XL) 25 MG 24 hr tablet Take one tablet by mouth once daily to regulate heart and control blood pressure.   Multiple Vitamins-Minerals (PRESERVISION AREDS 2 PO) Take 1 capsule by mouth 2 (two) times daily.    vitamin C (ASCORBIC ACID) 500 MG tablet Take 500 mg by mouth daily with supper.    Zinc Oxide 10 % OINT Apply 1 Application topically as needed.   No facility-administered encounter medications on file as of 07/05/2023.     Review of Systems:  Review of Systems  Constitutional:  Positive for activity change. Negative for appetite change.  HENT: Negative.    Respiratory:  Negative for cough and shortness of breath.   Cardiovascular:  Negative for leg swelling.  Gastrointestinal:  Negative for constipation.  Genitourinary: Negative.   Musculoskeletal:  Positive for gait problem. Negative for arthralgias and myalgias.  Skin: Negative.   Neurological:  Negative for dizziness and weakness.  Psychiatric/Behavioral:  Positive for confusion. Negative for dysphoric mood and sleep disturbance.     Health Maintenance  Topic Date Due   DTaP/Tdap/Td (4 - Td or Tdap) 03/20/2020   Medicare Annual Wellness (AWV)  09/06/2023   Pneumonia Vaccine 41+ Years old  Completed   INFLUENZA VACCINE  Completed   DEXA SCAN  Completed   COVID-19 Vaccine  Completed  Zoster Vaccines- Shingrix  Completed   HPV VACCINES  Aged Out    Physical Exam: Vitals:   07/05/23 1711  BP: 124/66  Pulse: 77  Resp: 20  Temp: 98.1 F (36.7 C)  Weight: 171 lb (77.6 kg)   Body mass index is 33.4 kg/m. Physical Exam Vitals reviewed.  Constitutional:      Appearance: Normal appearance.  HENT:     Head: Normocephalic.     Nose: Nose normal.     Mouth/Throat:     Mouth: Mucous membranes are moist.     Pharynx: Oropharynx is clear.  Eyes:     Pupils: Pupils are equal, round, and reactive to light.  Cardiovascular:     Rate and Rhythm: Normal rate and regular rhythm.     Pulses: Normal pulses.     Heart sounds: Normal heart sounds. No murmur heard. Pulmonary:     Effort: Pulmonary effort is normal.     Breath sounds: Normal breath sounds.  Abdominal:     General: Abdomen is flat. Bowel sounds are normal.     Palpations: Abdomen is soft.  Musculoskeletal:        General: No swelling.     Cervical back: Neck supple.  Skin:    General: Skin is warm.  Neurological:     General: No focal deficit present.     Mental Status:  She is alert.  Psychiatric:        Mood and Affect: Mood normal.        Thought Content: Thought content normal.     Labs reviewed: Basic Metabolic Panel: Recent Labs    07/26/22 1715 10/31/22 0000 03/05/23 0000  NA 139 141 140  K 4.2 3.7 4.2  CL 102 104 105  CO2 27 24* 27*  GLUCOSE 100*  --   --   BUN 18 29* 24*  CREATININE 0.74 0.8 0.6  CALCIUM 9.8 9.5 8.8  TSH  --  3.20  --    Liver Function Tests: Recent Labs    10/31/22 0000 03/05/23 0000  AST 21 14  ALT 18 16  ALKPHOS 71 71  ALBUMIN 4.1 3.4*   No results for input(s): "LIPASE", "AMYLASE" in the last 8760 hours. No results for input(s): "AMMONIA" in the last 8760 hours. CBC: Recent Labs    07/26/22 1715 10/31/22 0000 03/05/23 0000  WBC 7.0 10.3 7.4  NEUTROABS 4.1  --  4,151.00  HGB 13.6 13.8 12.2  HCT 41.0 42 37  MCV 92.6  --   --   PLT 207 234 219   Lipid Panel: No results for input(s): "CHOL", "HDL", "LDLCALC", "TRIG", "CHOLHDL", "LDLDIRECT" in the last 8760 hours. Lab Results  Component Value Date   HGBA1C 5.3 05/02/2013    Procedures since last visit: No results found.  Assessment/Plan 1. Essential hypertension (Primary) Metoprolol  2. TIA (transient ischemic attack) On Plavix  Not on statin due to Age  56. Primary osteoarthritis involving multiple joints Tylenol  4. Recurrent UTI Keflex  5. Unstable gait Working with therapy Uses Wheelchair  6. Cognitive impairment Mild Worsening    Labs/tests ordered:  * No order type specified * Next appt:  Visit date not found

## 2023-07-06 DIAGNOSIS — R278 Other lack of coordination: Secondary | ICD-10-CM | POA: Diagnosis not present

## 2023-07-06 DIAGNOSIS — R4701 Aphasia: Secondary | ICD-10-CM | POA: Diagnosis not present

## 2023-07-06 DIAGNOSIS — R262 Difficulty in walking, not elsewhere classified: Secondary | ICD-10-CM | POA: Diagnosis not present

## 2023-07-06 DIAGNOSIS — M6281 Muscle weakness (generalized): Secondary | ICD-10-CM | POA: Diagnosis not present

## 2023-07-06 DIAGNOSIS — R41841 Cognitive communication deficit: Secondary | ICD-10-CM | POA: Diagnosis not present

## 2023-07-08 ENCOUNTER — Encounter: Payer: Self-pay | Admitting: Internal Medicine

## 2023-07-09 DIAGNOSIS — R41841 Cognitive communication deficit: Secondary | ICD-10-CM | POA: Diagnosis not present

## 2023-07-09 DIAGNOSIS — R4701 Aphasia: Secondary | ICD-10-CM | POA: Diagnosis not present

## 2023-07-09 DIAGNOSIS — M6281 Muscle weakness (generalized): Secondary | ICD-10-CM | POA: Diagnosis not present

## 2023-07-09 DIAGNOSIS — R262 Difficulty in walking, not elsewhere classified: Secondary | ICD-10-CM | POA: Diagnosis not present

## 2023-07-09 DIAGNOSIS — R278 Other lack of coordination: Secondary | ICD-10-CM | POA: Diagnosis not present

## 2023-07-10 DIAGNOSIS — R4701 Aphasia: Secondary | ICD-10-CM | POA: Diagnosis not present

## 2023-07-10 DIAGNOSIS — R262 Difficulty in walking, not elsewhere classified: Secondary | ICD-10-CM | POA: Diagnosis not present

## 2023-07-10 DIAGNOSIS — M6281 Muscle weakness (generalized): Secondary | ICD-10-CM | POA: Diagnosis not present

## 2023-07-10 DIAGNOSIS — R278 Other lack of coordination: Secondary | ICD-10-CM | POA: Diagnosis not present

## 2023-07-10 DIAGNOSIS — R41841 Cognitive communication deficit: Secondary | ICD-10-CM | POA: Diagnosis not present

## 2023-07-11 DIAGNOSIS — M6281 Muscle weakness (generalized): Secondary | ICD-10-CM | POA: Diagnosis not present

## 2023-07-11 DIAGNOSIS — R278 Other lack of coordination: Secondary | ICD-10-CM | POA: Diagnosis not present

## 2023-07-11 DIAGNOSIS — R4701 Aphasia: Secondary | ICD-10-CM | POA: Diagnosis not present

## 2023-07-11 DIAGNOSIS — R41841 Cognitive communication deficit: Secondary | ICD-10-CM | POA: Diagnosis not present

## 2023-07-11 DIAGNOSIS — R262 Difficulty in walking, not elsewhere classified: Secondary | ICD-10-CM | POA: Diagnosis not present

## 2023-07-12 DIAGNOSIS — R41841 Cognitive communication deficit: Secondary | ICD-10-CM | POA: Diagnosis not present

## 2023-07-12 DIAGNOSIS — M6281 Muscle weakness (generalized): Secondary | ICD-10-CM | POA: Diagnosis not present

## 2023-07-12 DIAGNOSIS — R278 Other lack of coordination: Secondary | ICD-10-CM | POA: Diagnosis not present

## 2023-07-12 DIAGNOSIS — R4701 Aphasia: Secondary | ICD-10-CM | POA: Diagnosis not present

## 2023-07-12 DIAGNOSIS — R262 Difficulty in walking, not elsewhere classified: Secondary | ICD-10-CM | POA: Diagnosis not present

## 2023-07-13 DIAGNOSIS — H353221 Exudative age-related macular degeneration, left eye, with active choroidal neovascularization: Secondary | ICD-10-CM | POA: Diagnosis not present

## 2023-07-16 ENCOUNTER — Telehealth (INDEPENDENT_AMBULATORY_CARE_PROVIDER_SITE_OTHER): Payer: Self-pay | Admitting: Otolaryngology

## 2023-07-16 DIAGNOSIS — R278 Other lack of coordination: Secondary | ICD-10-CM | POA: Diagnosis not present

## 2023-07-16 DIAGNOSIS — M6281 Muscle weakness (generalized): Secondary | ICD-10-CM | POA: Diagnosis not present

## 2023-07-16 DIAGNOSIS — R262 Difficulty in walking, not elsewhere classified: Secondary | ICD-10-CM | POA: Diagnosis not present

## 2023-07-16 NOTE — Telephone Encounter (Signed)
 Left vm to confirm appt and address for 07/17/2023.

## 2023-07-17 ENCOUNTER — Ambulatory Visit (INDEPENDENT_AMBULATORY_CARE_PROVIDER_SITE_OTHER): Payer: Medicare Other

## 2023-07-17 ENCOUNTER — Encounter (INDEPENDENT_AMBULATORY_CARE_PROVIDER_SITE_OTHER): Payer: Self-pay

## 2023-07-17 VITALS — BP 159/88 | HR 78 | Ht 62.0 in | Wt 171.0 lb

## 2023-07-17 DIAGNOSIS — R262 Difficulty in walking, not elsewhere classified: Secondary | ICD-10-CM | POA: Diagnosis not present

## 2023-07-17 DIAGNOSIS — R278 Other lack of coordination: Secondary | ICD-10-CM | POA: Diagnosis not present

## 2023-07-17 DIAGNOSIS — H903 Sensorineural hearing loss, bilateral: Secondary | ICD-10-CM

## 2023-07-17 DIAGNOSIS — M6281 Muscle weakness (generalized): Secondary | ICD-10-CM | POA: Diagnosis not present

## 2023-07-18 DIAGNOSIS — M6281 Muscle weakness (generalized): Secondary | ICD-10-CM | POA: Diagnosis not present

## 2023-07-18 DIAGNOSIS — R278 Other lack of coordination: Secondary | ICD-10-CM | POA: Diagnosis not present

## 2023-07-18 DIAGNOSIS — H903 Sensorineural hearing loss, bilateral: Secondary | ICD-10-CM | POA: Insufficient documentation

## 2023-07-18 DIAGNOSIS — R262 Difficulty in walking, not elsewhere classified: Secondary | ICD-10-CM | POA: Diagnosis not present

## 2023-07-18 NOTE — Progress Notes (Signed)
 Patient ID: [name], female   DOB: October 19, 1928, 89 y.o.   MRN: [id number]  CC: Asymmetric hearing loss  HPI:  [name] is a 89 y.o. female who presents today with her daughter.  According to the daughter, the patient has a history of bilateral hearing loss.  She also has a history of recurrent cerumen impaction.  She was recently evaluated at [facility].  She was noted to have bilateral cerumen impaction.  After the cerumen disimpaction procedure, her hearing test showed bilateral high-frequency sensorineural hearing loss, worse on the left side.  The patient has never worn hearing aids.  She currently denies any otalgia or otorrhea.  Past Medical History:  Diagnosis Date   Cervicalgia    Chest pain at rest 01/07/2012   Displacement of cervical intervertebral disc without myelopathy    Diverticulosis of colon (without mention of hemorrhage)    Female stress incontinence    GERD (gastroesophageal reflux disease)    no peds occasional pepcid   Inguinal hernia without mention of obstruction or gangrene, unilateral or unspecified, (not specified as recurrent)    Internal hemorrhoids without mention of complication    Lumbago    Macular degeneration (senile) of retina, unspecified    Osteoarthrosis, unspecified whether generalized or localized, unspecified site    Other and unspecified hyperlipidemia    Pain in joint, hand    Palpitations    Reflux esophagitis    Scoliosis (and kyphoscoliosis), idiopathic    Senile osteoporosis    Skin disorder    Thyroid disease    TIA (transient ischemic attack)    Unruptured popliteal cyst 06/24/2014   Right knee    Unspecified essential hypertension    Unspecified glaucoma(365.9)    Unspecified hypothyroidism    Unspecified vitamin D deficiency     Past Surgical History:  Procedure Laterality Date   ABDOMINAL HYSTERECTOMY  1975   Dr Dewaine Conger   APPENDECTOMY  1988   cardiolite myocardial perfusion study      DOPPLER ECHOCARDIOGRAPHY     EYE SURGERY Bilateral 2009   cataract removed right eye, Dr Emmit Pomfret   FOOT SURGERY  august 2013   Hewitt, MD   INGUINAL HERNIA REPAIR Bilateral    w/mesh   INGUINAL HERNIA REPAIR Left 08/03/2017   Procedure: LAPAROSCOPIC LEFT INGUINAL HERNIA REPAIR WITH MESH;  Surgeon: Gaynelle Adu, MD;  Location: [facility] OR;  Service: General;  Laterality: Left;   INGUINAL HERNIA REPAIR Right 08/03/2017   Procedure: HERNIA REPAIR RIGHT INGUINAL ADULT WITH MESH;  Surgeon: Gaynelle Adu, MD;  Location: [facility] OR;  Service: General;  Laterality: Right;   INGUINAL HERNIA REPAIR     Dr. Andrey Campanile 04-24-18   INGUINAL HERNIA REPAIR Right 04/24/2018   Procedure: DIAGNOSTIC LAPAROSCOPIC, OPENREPAIR OF RECURRENT RIGHT INGUINAL HERNIA WITH MESH ERAS PATHWAY;  Surgeon: Gaynelle Adu, MD;  Location: [facility] ORS;  Service: General;  Laterality: Right;   INSERTION OF MESH Bilateral 08/03/2017   Procedure: INSERTION OF MESH;  Surgeon: Gaynelle Adu, MD;  Location: [facility] OR;  Service: General;  Laterality: Bilateral;   NM MYOVIEW LTD     negative   TONSILLECTOMY  1937   TOOTH EXTRACTION  09/16/13   Dr Randel Books    Family History  Problem Relation Age of Onset   CAD Mother    Parkinson's disease Mother    Cancer Father        colon cancer   Parkinson's disease Father    Stroke Maternal Grandmother  Social History:  reports that she has never smoked. She has never used smokeless tobacco. She reports that she does not drink alcohol and does not use drugs.  Allergies:  Allergies  Allergen Reactions   Trimethoprim Other (See Comments)    Headaches/ ear pressure    Erythromycin Other (See Comments)    UNSPECIFIED REACTION    Macrodantin [Nitrofurantoin Macrocrystal] Other (See Comments)    UNSPECIFIED REACTION    Other     Honeydew Melon   Penicillins Other (See Comments)    UNSPECIFIED REACTION  Has patient had a PCN reaction causing immediate rash, facial/tongue/throat swelling, SOB or lightheadedness  with hypotension: Unknown Has patient had a PCN reaction causing severe rash involving mucus membranes or skin necrosis: Unknown Has patient had a PCN reaction that required hospitalization: No Has patient had a PCN reaction occurring within the last 10 years: No If all of the above answers are "NO", then may proceed with Cephalosporin use.   Sulfa Antibiotics Other (See Comments)    UNSPECIFIED REACTION    Latex Itching    Prior to Admission medications   Medication Sig Start Date End Date Taking? Authorizing Provider  acetaminophen (TYLENOL) 500 MG tablet Take 500 mg by mouth 2 (two) times daily as needed for mild pain or headache.    Yes [provider]  albuterol (VENTOLIN HFA) 108 (90 Base) MCG/ACT inhaler Inhale 2 puffs into the lungs every 6 (six) hours as needed for wheezing or shortness of breath. 06/21/23  Yes Medina-Vargas, Monina C, NP  cephALEXin (KEFLEX) 250 MG capsule Take 250 mg by mouth daily. 08/07/22  Yes [provider]  Cholecalciferol (VITAMIN D3) 50 MCG (2000 UT) TABS Take by mouth.   Yes [provider]  clopidogrel (PLAVIX) 75 MG tablet TAKE ONE TABLET BY MOUTH DAILY 07/03/22  Yes Frederica Kuster, MD  Cranberry-Vitamin C-Probiotic (AZO CRANBERRY PO) Take 1 tablet by mouth daily.   Yes [provider]  Menthol, Topical Analgesic, (BIOFREEZE ROLL-ON) 4 % GEL Apply 1 Application topically in the morning and at bedtime.   Yes [provider]  metoprolol succinate (TOPROL-XL) 25 MG 24 hr tablet Take one tablet by mouth once daily to regulate heart and control blood pressure. 04/24/22  Yes Sharon Seller, NP  Multiple Vitamins-Minerals (PRESERVISION AREDS 2 PO) Take 1 capsule by mouth 2 (two) times daily.    Yes [provider]  vitamin C (ASCORBIC ACID) 500 MG tablet Take 500 mg by mouth daily with supper.    Yes [provider]  Zinc Oxide 10 % OINT Apply 1 Application topically as needed.   Yes [provider]    Blood pressure (!) 159/88, pulse 78, height 5\' 2"  (1.575 m), weight 171 lb (77.6 kg), SpO2 94%. Exam: General: Communicates without difficulty, well nourished, no acute distress. Head: Normocephalic, no evidence injury, no tenderness, facial buttresses intact without stepoff. Face/sinus: No tenderness to palpation and percussion. Facial movement is normal and symmetric. Eyes: PERRL, EOMI. No scleral icterus, conjunctivae clear. Neuro: CN II exam reveals vision grossly intact.  No nystagmus at any point of gaze. Ears: Auricles well formed without lesions.  Ear canals are intact without mass or lesion.  No erythema or edema is appreciated.  The TMs are intact without fluid. Nose: External evaluation reveals normal support and skin without lesions.  Dorsum is intact.  Anterior rhinoscopy reveals congested mucosa over anterior aspect of inferior turbinates and intact septum.  No purulence noted. Oral:  Oral cavity and oropharynx are intact, symmetric, without erythema or edema.  Mucosa is moist without lesions. Neck: Full range of motion without pain.  There is no significant lymphadenopathy.  No masses palpable.  Thyroid bed within normal limits to palpation.  Parotid glands and submandibular glands equal bilaterally without mass.  Trachea is midline. Neuro:  CN 2-12 grossly intact.   Assessment: 1.  Bilateral high-frequency sensorineural hearing loss, worse on the left side. 2.  Her ear canals, tympanic membranes, and middle ear spaces are otherwise normal.  Plan: 1.  Her physical exam findings and the hearing test results are reviewed with the patient and her daughter. 2.  The small possibility of a retrocochlear lesion causing the asymmetric hearing loss is discussed.  The options of conservative observation versus MRI scan are reviewed.  The patient would like to proceed with conservative observation.  3.  The patient is a candidate for hearing amplification.  She is encouraged to  return to [facility] for possible hearing aid fitting. 4.  The patient will return for reevaluation in 6 months.  Tovah Slavick W Tc Kapusta 07/18/2023, 11:38 AM

## 2023-07-19 DIAGNOSIS — R278 Other lack of coordination: Secondary | ICD-10-CM | POA: Diagnosis not present

## 2023-07-19 DIAGNOSIS — M6281 Muscle weakness (generalized): Secondary | ICD-10-CM | POA: Diagnosis not present

## 2023-07-19 DIAGNOSIS — R262 Difficulty in walking, not elsewhere classified: Secondary | ICD-10-CM | POA: Diagnosis not present

## 2023-07-23 DIAGNOSIS — R262 Difficulty in walking, not elsewhere classified: Secondary | ICD-10-CM | POA: Diagnosis not present

## 2023-07-23 DIAGNOSIS — M6281 Muscle weakness (generalized): Secondary | ICD-10-CM | POA: Diagnosis not present

## 2023-07-23 DIAGNOSIS — R278 Other lack of coordination: Secondary | ICD-10-CM | POA: Diagnosis not present

## 2023-07-24 DIAGNOSIS — R278 Other lack of coordination: Secondary | ICD-10-CM | POA: Diagnosis not present

## 2023-07-24 DIAGNOSIS — M6281 Muscle weakness (generalized): Secondary | ICD-10-CM | POA: Diagnosis not present

## 2023-07-24 DIAGNOSIS — R262 Difficulty in walking, not elsewhere classified: Secondary | ICD-10-CM | POA: Diagnosis not present

## 2023-07-25 DIAGNOSIS — M6281 Muscle weakness (generalized): Secondary | ICD-10-CM | POA: Diagnosis not present

## 2023-07-25 DIAGNOSIS — R262 Difficulty in walking, not elsewhere classified: Secondary | ICD-10-CM | POA: Diagnosis not present

## 2023-07-25 DIAGNOSIS — R278 Other lack of coordination: Secondary | ICD-10-CM | POA: Diagnosis not present

## 2023-07-26 DIAGNOSIS — M6281 Muscle weakness (generalized): Secondary | ICD-10-CM | POA: Diagnosis not present

## 2023-07-26 DIAGNOSIS — R278 Other lack of coordination: Secondary | ICD-10-CM | POA: Diagnosis not present

## 2023-07-26 DIAGNOSIS — R262 Difficulty in walking, not elsewhere classified: Secondary | ICD-10-CM | POA: Diagnosis not present

## 2023-07-27 DIAGNOSIS — M6281 Muscle weakness (generalized): Secondary | ICD-10-CM | POA: Diagnosis not present

## 2023-07-27 DIAGNOSIS — R262 Difficulty in walking, not elsewhere classified: Secondary | ICD-10-CM | POA: Diagnosis not present

## 2023-07-27 DIAGNOSIS — R278 Other lack of coordination: Secondary | ICD-10-CM | POA: Diagnosis not present

## 2023-07-29 DIAGNOSIS — M6281 Muscle weakness (generalized): Secondary | ICD-10-CM | POA: Diagnosis not present

## 2023-07-29 DIAGNOSIS — R278 Other lack of coordination: Secondary | ICD-10-CM | POA: Diagnosis not present

## 2023-07-29 DIAGNOSIS — R262 Difficulty in walking, not elsewhere classified: Secondary | ICD-10-CM | POA: Diagnosis not present

## 2023-07-30 ENCOUNTER — Encounter: Payer: Self-pay | Admitting: Orthopedic Surgery

## 2023-07-30 ENCOUNTER — Non-Acute Institutional Stay (SKILLED_NURSING_FACILITY): Payer: Self-pay | Admitting: Orthopedic Surgery

## 2023-07-30 DIAGNOSIS — M6281 Muscle weakness (generalized): Secondary | ICD-10-CM | POA: Diagnosis not present

## 2023-07-30 DIAGNOSIS — R2681 Unsteadiness on feet: Secondary | ICD-10-CM | POA: Diagnosis not present

## 2023-07-30 DIAGNOSIS — N390 Urinary tract infection, site not specified: Secondary | ICD-10-CM | POA: Diagnosis not present

## 2023-07-30 DIAGNOSIS — M150 Primary generalized (osteo)arthritis: Secondary | ICD-10-CM

## 2023-07-30 DIAGNOSIS — G459 Transient cerebral ischemic attack, unspecified: Secondary | ICD-10-CM

## 2023-07-30 DIAGNOSIS — I10 Essential (primary) hypertension: Secondary | ICD-10-CM | POA: Diagnosis not present

## 2023-07-30 DIAGNOSIS — H903 Sensorineural hearing loss, bilateral: Secondary | ICD-10-CM | POA: Diagnosis not present

## 2023-07-30 DIAGNOSIS — M810 Age-related osteoporosis without current pathological fracture: Secondary | ICD-10-CM

## 2023-07-30 DIAGNOSIS — R278 Other lack of coordination: Secondary | ICD-10-CM | POA: Diagnosis not present

## 2023-07-30 DIAGNOSIS — R262 Difficulty in walking, not elsewhere classified: Secondary | ICD-10-CM | POA: Diagnosis not present

## 2023-07-30 NOTE — Progress Notes (Unsigned)
 Location:  [facility] Room Number: 14/A [name]:  SNF [phone]) Provider:  Octavia Heir, NP   Mahlon Gammon, MD  Patient Care Team: Mahlon Gammon, MD as PCP - General (Internal Medicine) Runell Gess, MD as PCP - Cardiology (Cardiology) Carman Ching, MD (Inactive) as Consulting Physician (Gastroenterology) Esaw Dace, MD as Attending Physician (Urology) Runell Gess, MD as Consulting Physician (Cardiology) Drema Halon, MD (Inactive) as Consulting Physician (Otolaryngology) Elizebeth Koller, DDS (Dentistry) Mckinley Jewel, MD as Consulting Physician (Ophthalmology) Bradly Bienenstock, MD as Consulting Physician (Orthopedic Surgery) Gaynelle Adu, MD as Consulting Physician (General Surgery)  Extended Emergency Contact Information Primary Emergency Contact: [facility] Address: [address]          [place], [place] [address] D[place] of [place] Home Phone: [phone] Mobile Phone: [phone] Relation: Son Secondary Emergency Contact: Sheran Luz Address: [address]          [place], [place] [address] [place] of [place] Home Phone: [phone] Work Phone: [phone] Mobile Phone: [phone] Relation: Daughter  Code Status:  DNR Goals of care: Advanced Directive information    06/21/2023   11:20 AM  Advanced Directives  Does Patient Have a Medical Advance Directive? Yes  Type of Estate agent of [place];Living will;Out of facility DNR (pink MOST or yellow form)  Does patient want to make changes to medical advance directive? No - Patient declined  Copy of Healthcare Power of Attorney in Chart? Yes - validated most recent copy scanned in chart (See row information)  Pre-existing out of facility DNR order (yellow form or pink MOST form) Yellow form placed in chart (order not valid for inpatient use)     Chief Complaint  Patient presents with   Medical Management of Chronic  Issues    HPI:  Pt is a 89 y.o. female seen today for medical management of chronic diseases.    She currently resides on the skilled nursing unit at [facility]. PMH: hypertension, hyperlipidemia, macular degeneration, mild cognitive impairment, recurrent UTI, GERD, H/o TIA.    HTN- BUN/creat 24/0.6 03/05/2023, remains on metoprolol TIA- noted in 2014, not followed by neurology, remains on Plavix Recurrent UTI- followed by urology, remains on Keflex Unstable gait- ambulates with wheelchair, 1+ assist with ambulation, working with PT at this time Hearing loss- 03/04 seen by Dr. Suszanne Conners, L>R, wants conservative management, plan to go to [facility] for hearing amplification, completed Debrox and flush last month Osteoarthritis- left shoulder and right ankle most bothersome, she is working with PT/OT at this time> denies increased pain after sessions, remains on tylenol prn and Biofreeze Senile osteoporosis- DEXA 2018, t score -2.3, remains on vitamin D   02/06 she tested positive for covid. She did not take paxlovid or molnupriavir due to cost. Denies long term symptoms today.    Recent blood pressures:  03/11- 157/92  03/04- 135/66  02/25- 126/84  Recent weights:  03/01- 171.4 lbs  02/03- 171 lbs  01/01- 171.1 lbs      Past Medical History:  Diagnosis Date   Cervicalgia    Chest pain at rest 01/07/2012   Displacement of cervical intervertebral disc without myelopathy    Diverticulosis of colon (without mention of hemorrhage)    Female stress incontinence    GERD (gastroesophageal reflux disease)    no peds occasional pepcid   Inguinal hernia without mention of obstruction or gangrene, unilateral or unspecified, (not specified as recurrent)  Internal hemorrhoids without mention of complication    Lumbago    Macular degeneration (senile) of retina, unspecified    Osteoarthrosis, unspecified whether generalized or localized, unspecified site    Other and unspecified  hyperlipidemia    Pain in joint, hand    Palpitations    Reflux esophagitis    Scoliosis (and kyphoscoliosis), idiopathic    Senile osteoporosis    Skin disorder    Thyroid disease    TIA (transient ischemic attack)    Unruptured popliteal cyst 06/24/2014   Right knee    Unspecified essential hypertension    Unspecified glaucoma(365.9)    Unspecified hypothyroidism    Unspecified vitamin D deficiency    Past Surgical History:  Procedure Laterality Date   ABDOMINAL HYSTERECTOMY  1975   Dr Dewaine Conger   APPENDECTOMY  1988   cardiolite myocardial perfusion study     DOPPLER ECHOCARDIOGRAPHY     EYE SURGERY Bilateral 2009   cataract removed right eye, Dr Emmit Pomfret   FOOT SURGERY  august 2013   Hewitt, MD   INGUINAL HERNIA REPAIR Bilateral    w/mesh   INGUINAL HERNIA REPAIR Left 08/03/2017   Procedure: LAPAROSCOPIC LEFT INGUINAL HERNIA REPAIR WITH MESH;  Surgeon: Gaynelle Adu, MD;  Location: [facility] OR;  Service: General;  Laterality: Left;   INGUINAL HERNIA REPAIR Right 08/03/2017   Procedure: HERNIA REPAIR RIGHT INGUINAL ADULT WITH MESH;  Surgeon: Gaynelle Adu, MD;  Location: [facility] OR;  Service: General;  Laterality: Right;   INGUINAL HERNIA REPAIR     Dr. Andrey Campanile 04-24-18   INGUINAL HERNIA REPAIR Right 04/24/2018   Procedure: DIAGNOSTIC LAPAROSCOPIC, OPENREPAIR OF RECURRENT RIGHT INGUINAL HERNIA WITH MESH ERAS PATHWAY;  Surgeon: Gaynelle Adu, MD;  Location: [facility] ORS;  Service: General;  Laterality: Right;   INSERTION OF MESH Bilateral 08/03/2017   Procedure: INSERTION OF MESH;  Surgeon: Gaynelle Adu, MD;  Location: [facility] OR;  Service: General;  Laterality: Bilateral;   NM MYOVIEW LTD     negative   TONSILLECTOMY  1937   TOOTH EXTRACTION  09/16/13   Dr Randel Books    Allergies  Allergen Reactions   Trimethoprim Other (See Comments)    Headaches/ ear pressure    Erythromycin Other (See Comments)    UNSPECIFIED REACTION    Macrodantin [Nitrofurantoin Macrocrystal] Other (See Comments)    UNSPECIFIED  REACTION    Other     Honeydew Melon   Penicillins Other (See Comments)    UNSPECIFIED REACTION  Has patient had a PCN reaction causing immediate rash, facial/tongue/throat swelling, SOB or lightheadedness with hypotension: Unknown Has patient had a PCN reaction causing severe rash involving mucus membranes or skin necrosis: Unknown Has patient had a PCN reaction that required hospitalization: No Has patient had a PCN reaction occurring within the last 10 years: No If all of the above answers are "NO", then may proceed with Cephalosporin use.   Sulfa Antibiotics Other (See Comments)    UNSPECIFIED REACTION    Latex Itching    Outpatient Encounter Medications as of 07/30/2023  Medication Sig   acetaminophen (TYLENOL) 500 MG tablet Take 500 mg by mouth 2 (two) times daily as needed for mild pain or headache.    albuterol (VENTOLIN HFA) 108 (90 Base) MCG/ACT inhaler Inhale 2 puffs into the lungs every 6 (six) hours as needed for wheezing or shortness of breath.   cephALEXin (KEFLEX) 250 MG capsule Take 250 mg by mouth daily.   Cholecalciferol (VITAMIN D3) 50 MCG (2000 UT) TABS Take  by mouth.   clopidogrel (PLAVIX) 75 MG tablet TAKE ONE TABLET BY MOUTH DAILY   Cranberry-Vitamin C-Probiotic (AZO CRANBERRY PO) Take 1 tablet by mouth daily.   Menthol, Topical Analgesic, (BIOFREEZE ROLL-ON) 4 % GEL Apply 1 Application topically in the morning and at bedtime.   metoprolol succinate (TOPROL-XL) 25 MG 24 hr tablet Take one tablet by mouth once daily to regulate heart and control blood pressure.   Multiple Vitamins-Minerals (PRESERVISION AREDS 2 PO) Take 1 capsule by mouth 2 (two) times daily.    vitamin C (ASCORBIC ACID) 500 MG tablet Take 500 mg by mouth daily with supper.    Zinc Oxide 10 % OINT Apply 1 Application topically as needed.   No facility-administered encounter medications on file as of 07/30/2023.    Review of Systems  Constitutional:  Negative for fatigue and fever.  HENT:   Negative for drooling, sore throat and trouble swallowing.   Eyes:  Negative for visual disturbance.  Respiratory:  Negative for cough and shortness of breath.   Cardiovascular:  Negative for chest pain and leg swelling.  Gastrointestinal:  Negative for abdominal distention and abdominal pain.  Genitourinary:  Negative for dysuria and hematuria.  Musculoskeletal:  Positive for arthralgias and gait problem.  Skin:  Negative for wound.  Neurological:  Positive for weakness. Negative for dizziness and headaches.  Psychiatric/Behavioral:  Negative for confusion, dysphoric mood and sleep disturbance. The patient is not nervous/anxious.     Immunization History  Administered Date(s) Administered   Fluad Quad(high Dose 65+) 02/04/2019, 03/01/2022   Influenza Whole 02/24/2011, 02/12/2012   Influenza, High Dose Seasonal PF 02/01/2017, 02/25/2018, 02/11/2020, 03/13/2023   Influenza,inj,Quad PF,6+ Mos 02/20/2013, 02/10/2014, 02/19/2015, 02/18/2016   Influenza-Unspecified 02/16/2021   Moderna Covid-19 Fall Seasonal Vaccine 18yrs & older 02/09/2022, 03/21/2023   Moderna Sars-Covid-2 Vaccination 05/19/2019, 06/16/2019, 03/29/2020, 09/27/2020   Pneumococcal Conjugate-13 06/24/2014   Pneumococcal Polysaccharide-23 04/12/1998   Td 02/21/1996, 07/23/2003   Tdap 03/20/2010   Zoster Recombinant(Shingrix) 09/19/2016, 12/02/2016   Zoster, Live 09/15/2005   Pertinent  Health Maintenance Due  Topic Date Due   INFLUENZA VACCINE  Completed   DEXA SCAN  Completed      07/31/2022   11:27 AM 08/23/2022    1:05 PM 09/06/2022    2:27 PM 12/27/2022    2:15 PM 04/18/2023    4:49 PM  Fall Risk  Falls in the past year? 1 1 1  0 0  Was there an injury with Fall? 1 0 1 0 0  Fall Risk Category Calculator 2 1 2  0 0  Patient at Risk for Falls Due to Impaired balance/gait;Impaired mobility History of fall(s) History of fall(s);Impaired balance/gait;Impaired mobility No Fall Risks History of fall(s);Impaired  balance/gait;Impaired mobility  Fall risk Follow up Falls evaluation completed;Education provided;Falls prevention discussed Falls evaluation completed Falls evaluation completed;Education provided;Falls prevention discussed Falls evaluation completed;Education provided;Falls prevention discussed Falls evaluation completed;Education provided;Falls prevention discussed   Functional Status Survey:    Vitals:   07/30/23 1546  BP: (!) 157/92  Pulse: 71  Resp: 18  Temp: (!) 96.6 F (35.9 C)  SpO2: 93%  Weight: 171 lb 6.4 oz (77.7 kg)  Height: 5\' 2"  (1.575 m)   Body mass index is 31.35 kg/m. Physical Exam Vitals reviewed.  Constitutional:      General: She is not in acute distress. HENT:     Head: Normocephalic.     Right Ear: There is no impacted cerumen.     Left Ear: There is no impacted  cerumen.     Ears:     Comments: HOH    Nose: Nose normal.     Mouth/Throat:     Mouth: Mucous membranes are moist.  Eyes:     General:        Right eye: No discharge.        Left eye: No discharge.  Cardiovascular:     Rate and Rhythm: Normal rate and regular rhythm.     Pulses: Normal pulses.     Heart sounds: Normal heart sounds.  Pulmonary:     Effort: Pulmonary effort is normal.     Breath sounds: Normal breath sounds.  Abdominal:     General: Bowel sounds are normal. There is no distension.     Palpations: Abdomen is soft.     Tenderness: There is no abdominal tenderness.  Musculoskeletal:     Cervical back: Neck supple.     Right lower leg: No edema.     Left lower leg: No edema.  Skin:    General: Skin is warm.     Capillary Refill: Capillary refill takes less than 2 seconds.  Neurological:     General: No focal deficit present.     Mental Status: She is alert and oriented to person, place, and time.     Motor: Weakness present.     Gait: Gait abnormal.     Comments: W/c, walker  Psychiatric:        Mood and Affect: Mood normal.     Labs reviewed: Recent Labs     10/31/22 0000 03/05/23 0000  NA 141 140  K 3.7 4.2  CL 104 105  CO2 24* 27*  BUN 29* 24*  CREATININE 0.8 0.6  CALCIUM 9.5 8.8   Recent Labs    10/31/22 0000 03/05/23 0000  AST 21 14  ALT 18 16  ALKPHOS 71 71  ALBUMIN 4.1 3.4*   Recent Labs    10/31/22 0000 03/05/23 0000  WBC 10.3 7.4  NEUTROABS  --  4,151.00  HGB 13.8 12.2  HCT 42 37  PLT 234 219   Lab Results  Component Value Date   TSH 3.20 10/31/2022   Lab Results  Component Value Date   HGBA1C 5.3 05/02/2013   Lab Results  Component Value Date   CHOL 188 07/10/2019   HDL 75 07/10/2019   LDLCALC 94 07/10/2019   TRIG 96 07/10/2019   CHOLHDL 2.5 07/10/2019    Significant Diagnostic Results in last 30 days:  No results found.  Assessment/Plan 1. Essential hypertension (Primary) - controlled with metoprolol  2. TIA (transient ischemic attack) - none since 2014 - cont Plavix  3. Recurrent UTI - stable with Keflex  4. Unstable gait - no recent falls - cont PT/OT  5. Sensory hearing loss, bilateral - 03/4 evaluated by Dr. Suszanne Conners MRI recommended> patient wanted conservative management - L>R - plans to see [facility] for hearing amplification  6. Primary osteoarthritis involving multiple joints - improved with Biofreeze - cont tylenol   7. Senile osteoporosis - DEXA 2018 - cont vitamin D   Family/ staff Communication: plan discussed with patient and nurse  Labs/tests ordered:  cbc/diff, cmp, TSH 04/03

## 2023-07-31 DIAGNOSIS — M6281 Muscle weakness (generalized): Secondary | ICD-10-CM | POA: Diagnosis not present

## 2023-07-31 DIAGNOSIS — R278 Other lack of coordination: Secondary | ICD-10-CM | POA: Diagnosis not present

## 2023-07-31 DIAGNOSIS — R262 Difficulty in walking, not elsewhere classified: Secondary | ICD-10-CM | POA: Diagnosis not present

## 2023-08-01 DIAGNOSIS — R278 Other lack of coordination: Secondary | ICD-10-CM | POA: Diagnosis not present

## 2023-08-01 DIAGNOSIS — R262 Difficulty in walking, not elsewhere classified: Secondary | ICD-10-CM | POA: Diagnosis not present

## 2023-08-01 DIAGNOSIS — M6281 Muscle weakness (generalized): Secondary | ICD-10-CM | POA: Diagnosis not present

## 2023-08-02 DIAGNOSIS — M6281 Muscle weakness (generalized): Secondary | ICD-10-CM | POA: Diagnosis not present

## 2023-08-02 DIAGNOSIS — R262 Difficulty in walking, not elsewhere classified: Secondary | ICD-10-CM | POA: Diagnosis not present

## 2023-08-02 DIAGNOSIS — R278 Other lack of coordination: Secondary | ICD-10-CM | POA: Diagnosis not present

## 2023-08-03 DIAGNOSIS — R278 Other lack of coordination: Secondary | ICD-10-CM | POA: Diagnosis not present

## 2023-08-03 DIAGNOSIS — R262 Difficulty in walking, not elsewhere classified: Secondary | ICD-10-CM | POA: Diagnosis not present

## 2023-08-03 DIAGNOSIS — M6281 Muscle weakness (generalized): Secondary | ICD-10-CM | POA: Diagnosis not present

## 2023-08-06 DIAGNOSIS — R278 Other lack of coordination: Secondary | ICD-10-CM | POA: Diagnosis not present

## 2023-08-06 DIAGNOSIS — R262 Difficulty in walking, not elsewhere classified: Secondary | ICD-10-CM | POA: Diagnosis not present

## 2023-08-06 DIAGNOSIS — M6281 Muscle weakness (generalized): Secondary | ICD-10-CM | POA: Diagnosis not present

## 2023-08-07 DIAGNOSIS — M6281 Muscle weakness (generalized): Secondary | ICD-10-CM | POA: Diagnosis not present

## 2023-08-07 DIAGNOSIS — R278 Other lack of coordination: Secondary | ICD-10-CM | POA: Diagnosis not present

## 2023-08-07 DIAGNOSIS — R262 Difficulty in walking, not elsewhere classified: Secondary | ICD-10-CM | POA: Diagnosis not present

## 2023-08-08 DIAGNOSIS — M6281 Muscle weakness (generalized): Secondary | ICD-10-CM | POA: Diagnosis not present

## 2023-08-08 DIAGNOSIS — R278 Other lack of coordination: Secondary | ICD-10-CM | POA: Diagnosis not present

## 2023-08-08 DIAGNOSIS — R262 Difficulty in walking, not elsewhere classified: Secondary | ICD-10-CM | POA: Diagnosis not present

## 2023-08-09 DIAGNOSIS — M6281 Muscle weakness (generalized): Secondary | ICD-10-CM | POA: Diagnosis not present

## 2023-08-09 DIAGNOSIS — R278 Other lack of coordination: Secondary | ICD-10-CM | POA: Diagnosis not present

## 2023-08-09 DIAGNOSIS — R262 Difficulty in walking, not elsewhere classified: Secondary | ICD-10-CM | POA: Diagnosis not present

## 2023-08-10 DIAGNOSIS — R262 Difficulty in walking, not elsewhere classified: Secondary | ICD-10-CM | POA: Diagnosis not present

## 2023-08-10 DIAGNOSIS — M6281 Muscle weakness (generalized): Secondary | ICD-10-CM | POA: Diagnosis not present

## 2023-08-10 DIAGNOSIS — R278 Other lack of coordination: Secondary | ICD-10-CM | POA: Diagnosis not present

## 2023-08-12 DIAGNOSIS — M6281 Muscle weakness (generalized): Secondary | ICD-10-CM | POA: Diagnosis not present

## 2023-08-12 DIAGNOSIS — R262 Difficulty in walking, not elsewhere classified: Secondary | ICD-10-CM | POA: Diagnosis not present

## 2023-08-12 DIAGNOSIS — R278 Other lack of coordination: Secondary | ICD-10-CM | POA: Diagnosis not present

## 2023-08-13 DIAGNOSIS — R262 Difficulty in walking, not elsewhere classified: Secondary | ICD-10-CM | POA: Diagnosis not present

## 2023-08-13 DIAGNOSIS — M6281 Muscle weakness (generalized): Secondary | ICD-10-CM | POA: Diagnosis not present

## 2023-08-13 DIAGNOSIS — R278 Other lack of coordination: Secondary | ICD-10-CM | POA: Diagnosis not present

## 2023-08-15 DIAGNOSIS — M6281 Muscle weakness (generalized): Secondary | ICD-10-CM | POA: Diagnosis not present

## 2023-08-15 DIAGNOSIS — R262 Difficulty in walking, not elsewhere classified: Secondary | ICD-10-CM | POA: Diagnosis not present

## 2023-08-15 DIAGNOSIS — R278 Other lack of coordination: Secondary | ICD-10-CM | POA: Diagnosis not present

## 2023-08-16 DIAGNOSIS — R262 Difficulty in walking, not elsewhere classified: Secondary | ICD-10-CM | POA: Diagnosis not present

## 2023-08-16 DIAGNOSIS — R278 Other lack of coordination: Secondary | ICD-10-CM | POA: Diagnosis not present

## 2023-08-16 DIAGNOSIS — Z01812 Encounter for preprocedural laboratory examination: Secondary | ICD-10-CM | POA: Diagnosis not present

## 2023-08-16 DIAGNOSIS — E039 Hypothyroidism, unspecified: Secondary | ICD-10-CM | POA: Diagnosis not present

## 2023-08-16 DIAGNOSIS — F0390 Unspecified dementia without behavioral disturbance: Secondary | ICD-10-CM | POA: Diagnosis not present

## 2023-08-16 DIAGNOSIS — M6281 Muscle weakness (generalized): Secondary | ICD-10-CM | POA: Diagnosis not present

## 2023-08-17 DIAGNOSIS — M6281 Muscle weakness (generalized): Secondary | ICD-10-CM | POA: Diagnosis not present

## 2023-08-17 DIAGNOSIS — R262 Difficulty in walking, not elsewhere classified: Secondary | ICD-10-CM | POA: Diagnosis not present

## 2023-08-17 DIAGNOSIS — R278 Other lack of coordination: Secondary | ICD-10-CM | POA: Diagnosis not present

## 2023-08-20 DIAGNOSIS — M6281 Muscle weakness (generalized): Secondary | ICD-10-CM | POA: Diagnosis not present

## 2023-08-20 DIAGNOSIS — R278 Other lack of coordination: Secondary | ICD-10-CM | POA: Diagnosis not present

## 2023-08-20 DIAGNOSIS — R262 Difficulty in walking, not elsewhere classified: Secondary | ICD-10-CM | POA: Diagnosis not present

## 2023-08-21 DIAGNOSIS — M6281 Muscle weakness (generalized): Secondary | ICD-10-CM | POA: Diagnosis not present

## 2023-08-21 DIAGNOSIS — R262 Difficulty in walking, not elsewhere classified: Secondary | ICD-10-CM | POA: Diagnosis not present

## 2023-08-21 DIAGNOSIS — R278 Other lack of coordination: Secondary | ICD-10-CM | POA: Diagnosis not present

## 2023-08-22 DIAGNOSIS — H353221 Exudative age-related macular degeneration, left eye, with active choroidal neovascularization: Secondary | ICD-10-CM | POA: Diagnosis not present

## 2023-08-22 DIAGNOSIS — M6281 Muscle weakness (generalized): Secondary | ICD-10-CM | POA: Diagnosis not present

## 2023-08-22 DIAGNOSIS — R278 Other lack of coordination: Secondary | ICD-10-CM | POA: Diagnosis not present

## 2023-08-22 DIAGNOSIS — R262 Difficulty in walking, not elsewhere classified: Secondary | ICD-10-CM | POA: Diagnosis not present

## 2023-08-23 DIAGNOSIS — M6281 Muscle weakness (generalized): Secondary | ICD-10-CM | POA: Diagnosis not present

## 2023-08-23 DIAGNOSIS — R278 Other lack of coordination: Secondary | ICD-10-CM | POA: Diagnosis not present

## 2023-08-23 DIAGNOSIS — R262 Difficulty in walking, not elsewhere classified: Secondary | ICD-10-CM | POA: Diagnosis not present

## 2023-08-24 DIAGNOSIS — H353211 Exudative age-related macular degeneration, right eye, with active choroidal neovascularization: Secondary | ICD-10-CM | POA: Diagnosis not present

## 2023-08-24 DIAGNOSIS — R278 Other lack of coordination: Secondary | ICD-10-CM | POA: Diagnosis not present

## 2023-08-24 DIAGNOSIS — R262 Difficulty in walking, not elsewhere classified: Secondary | ICD-10-CM | POA: Diagnosis not present

## 2023-08-24 DIAGNOSIS — M6281 Muscle weakness (generalized): Secondary | ICD-10-CM | POA: Diagnosis not present

## 2023-08-27 ENCOUNTER — Non-Acute Institutional Stay (SKILLED_NURSING_FACILITY): Payer: Self-pay | Admitting: Orthopedic Surgery

## 2023-08-27 ENCOUNTER — Encounter: Payer: Self-pay | Admitting: Orthopedic Surgery

## 2023-08-27 DIAGNOSIS — R4189 Other symptoms and signs involving cognitive functions and awareness: Secondary | ICD-10-CM

## 2023-08-27 DIAGNOSIS — M150 Primary generalized (osteo)arthritis: Secondary | ICD-10-CM | POA: Diagnosis not present

## 2023-08-27 DIAGNOSIS — R278 Other lack of coordination: Secondary | ICD-10-CM | POA: Diagnosis not present

## 2023-08-27 DIAGNOSIS — M6281 Muscle weakness (generalized): Secondary | ICD-10-CM | POA: Diagnosis not present

## 2023-08-27 DIAGNOSIS — D692 Other nonthrombocytopenic purpura: Secondary | ICD-10-CM | POA: Diagnosis not present

## 2023-08-27 DIAGNOSIS — R2681 Unsteadiness on feet: Secondary | ICD-10-CM | POA: Diagnosis not present

## 2023-08-27 DIAGNOSIS — R635 Abnormal weight gain: Secondary | ICD-10-CM

## 2023-08-27 DIAGNOSIS — H903 Sensorineural hearing loss, bilateral: Secondary | ICD-10-CM | POA: Diagnosis not present

## 2023-08-27 DIAGNOSIS — N390 Urinary tract infection, site not specified: Secondary | ICD-10-CM

## 2023-08-27 DIAGNOSIS — G459 Transient cerebral ischemic attack, unspecified: Secondary | ICD-10-CM | POA: Diagnosis not present

## 2023-08-27 DIAGNOSIS — I10 Essential (primary) hypertension: Secondary | ICD-10-CM | POA: Diagnosis not present

## 2023-08-27 DIAGNOSIS — M810 Age-related osteoporosis without current pathological fracture: Secondary | ICD-10-CM | POA: Diagnosis not present

## 2023-08-27 DIAGNOSIS — R262 Difficulty in walking, not elsewhere classified: Secondary | ICD-10-CM | POA: Diagnosis not present

## 2023-08-27 NOTE — Progress Notes (Signed)
 Location:  [facility] Room Number: 14/A [name]:  SNF [phone]) Provider:  Arnetha Bhat, NP   Meredith Shiley, MD  Patient Care Team: Meredith Shiley, MD as PCP - General (Internal Medicine) Avanell Leigh, MD as PCP - Cardiology (Cardiology) Jolinda Necessary, MD (Inactive) as Consulting Physician (Gastroenterology) Orvan Blanch, MD as Attending Physician (Urology) Avanell Leigh, MD as Consulting Physician (Cardiology) Prescott Brodie, MD (Inactive) as Consulting Physician (Otolaryngology) Bartholome Boron, DDS (Dentistry) Oris Birmingham, MD as Consulting Physician (Ophthalmology) Arvil Birks, MD as Consulting Physician (Orthopedic Surgery) Aldean Hummingbird, MD as Consulting Physician (General Surgery)  Extended Emergency Contact Information Primary Emergency Contact: Burciaga,Don Address: [address]          [place], [place] [address] [place] Home Phone: [phone] Mobile Phone: [phone] Relation: Son Secondary Emergency Contact: Jerene Monks Address: [address]          [place], [place] [address] [place] Home Phone: [phone] Work Phone: [phone] Mobile Phone: [phone] Relation: Daughter  Code Status:  DNR Goals of care: Advanced Directive information    06/21/2023   11:20 AM  Advanced Directives  Does Patient Have a Medical Advance Directive? Yes  Type of Estate agent of [place];Living will;Out of facility DNR (pink MOST or yellow form)  Does patient want to make changes to medical advance directive? No - Patient declined  Copy of Healthcare Power of Attorney in Chart? Yes - validated most recent copy scanned in chart (See row information)  Pre-existing out of facility DNR order (yellow form or pink MOST form) Yellow form placed in chart (order not valid for inpatient use)     Chief Complaint  Patient presents with   Medical Management of Chronic  Issues    HPI:  Pt is a 89 y.o. female seen today for medical management of chronic diseases.   She currently resides on the skilled nursing unit at [facility]. PMH: hypertension, hyperlipidemia, macular degeneration, mild cognitive impairment, recurrent UTI, GERD, H/o TIA.    Cognitive impairment- MMSE 28/30 08/2022, recent BIMS 8/15 (02/25), was 12/15 (11/14) HTN- BUN/creat 24/0.6 03/05/2023, remains on metoprolol TIA- noted in 2014, not followed by neurology, remains on Plavix Recurrent UTI- followed by urology, remains on Keflex Unstable gait- ambulates with wheelchair, 1+ assist with ambulation, working with PT at this time Hearing loss- 03/04 seen by Dr. Darlin Ehrlich, L>R, wants conservative management, plan to go to [facility] for [facility] amplification Osteoarthritis- left shoulder and right ankle most bothersome, she is working with PT/OT at this time> denies increased pain after sessions, remains on tylenol prn and Biofreeze Senile osteoporosis- DEXA 2018, t score -2.3, remains on vitamin D   Recent weights:  04/01- 172.1 lbs  03/01- 171.4 lbs  02/03- 171 lbs  Recent blood pressures:  04/08- 156/79  04/01- 140/80  03/25- 136/80      Past Medical History:  Diagnosis Date   Cervicalgia    Chest pain at rest 01/07/2012   Displacement of cervical intervertebral disc without myelopathy    Diverticulosis of colon (without mention of hemorrhage)    Female stress incontinence    GERD (gastroesophageal reflux disease)    no peds occasional pepcid   Inguinal hernia without mention of obstruction or gangrene, unilateral or unspecified, (not specified as recurrent)    Internal hemorrhoids without mention of complication    Lumbago    Macular degeneration (senile) of retina, unspecified  Osteoarthrosis, unspecified whether generalized or localized, unspecified site    Other and unspecified hyperlipidemia    Pain in joint, hand    Palpitations    Reflux esophagitis    Scoliosis  (and kyphoscoliosis), idiopathic    Senile osteoporosis    Skin disorder    Thyroid disease    TIA (transient ischemic attack)    Unruptured popliteal cyst 06/24/2014   Right knee    Unspecified essential hypertension    Unspecified glaucoma(365.9)    Unspecified hypothyroidism    Unspecified vitamin D deficiency    Past Surgical History:  Procedure Laterality Date   ABDOMINAL HYSTERECTOMY  1975   Dr Delaine Favorite   APPENDECTOMY  1988   cardiolite myocardial perfusion study     DOPPLER ECHOCARDIOGRAPHY     EYE SURGERY Bilateral 2009   cataract removed right eye, Dr Neil Balls   FOOT SURGERY  august 2013   Hewitt, MD   INGUINAL HERNIA REPAIR Bilateral    w/mesh   INGUINAL HERNIA REPAIR Left 08/03/2017   Procedure: LAPAROSCOPIC LEFT INGUINAL HERNIA REPAIR WITH MESH;  Surgeon: Aldean Hummingbird, MD;  Location: [facility] OR;  Service: General;  Laterality: Left;   INGUINAL HERNIA REPAIR Right 08/03/2017   Procedure: HERNIA REPAIR RIGHT INGUINAL ADULT WITH MESH;  Surgeon: Aldean Hummingbird, MD;  Location: [facility] OR;  Service: General;  Laterality: Right;   INGUINAL HERNIA REPAIR     Dr. Elvan Hamel 04-24-18   INGUINAL HERNIA REPAIR Right 04/24/2018   Procedure: DIAGNOSTIC LAPAROSCOPIC, OPENREPAIR OF RECURRENT RIGHT INGUINAL HERNIA WITH MESH ERAS PATHWAY;  Surgeon: Aldean Hummingbird, MD;  Location: [facility] ORS;  Service: General;  Laterality: Right;   INSERTION OF MESH Bilateral 08/03/2017   Procedure: INSERTION OF MESH;  Surgeon: Aldean Hummingbird, MD;  Location: [facility] OR;  Service: General;  Laterality: Bilateral;   NM MYOVIEW LTD     negative   TONSILLECTOMY  1937   TOOTH EXTRACTION  09/16/13   Dr Isabell Manzanilla    Allergies  Allergen Reactions   Trimethoprim Other (See Comments)    Headaches/ ear pressure    Erythromycin Other (See Comments)    UNSPECIFIED REACTION    Macrodantin [Nitrofurantoin Macrocrystal] Other (See Comments)    UNSPECIFIED REACTION    Other     Honeydew Melon   Penicillins Other (See Comments)    UNSPECIFIED  REACTION  Has patient had a PCN reaction causing immediate rash, facial/tongue/throat swelling, SOB or lightheadedness with hypotension: Unknown Has patient had a PCN reaction causing severe rash involving mucus membranes or skin necrosis: Unknown Has patient had a PCN reaction that required hospitalization: No Has patient had a PCN reaction occurring within the last 10 years: No If all of the above answers are "NO", then may proceed with Cephalosporin use.   Sulfa Antibiotics Other (See Comments)    UNSPECIFIED REACTION    Latex Itching    Outpatient Encounter Medications as of 08/27/2023  Medication Sig   acetaminophen (TYLENOL) 500 MG tablet Take 500 mg by mouth 2 (two) times daily as needed for mild pain or headache.    albuterol (VENTOLIN HFA) 108 (90 Base) MCG/ACT inhaler Inhale 2 puffs into the lungs every 6 (six) hours as needed for wheezing or shortness of breath.   cephALEXin (KEFLEX) 250 MG capsule Take 250 mg by mouth daily.   Cholecalciferol (VITAMIN D3) 50 MCG (2000 UT) TABS Take by mouth.   clopidogrel (PLAVIX) 75 MG tablet TAKE ONE TABLET BY MOUTH DAILY   Cranberry-Vitamin C-Probiotic (AZO CRANBERRY PO)  Take 1 tablet by mouth daily.   Menthol, Topical Analgesic, (BIOFREEZE ROLL-ON) 4 % GEL Apply 1 Application topically in the morning and at bedtime.   metoprolol succinate (TOPROL-XL) 25 MG 24 hr tablet Take one tablet by mouth once daily to regulate heart and control blood pressure.   Multiple Vitamins-Minerals (PRESERVISION AREDS 2 PO) Take 1 capsule by mouth 2 (two) times daily.    vitamin C (ASCORBIC ACID) 500 MG tablet Take 500 mg by mouth daily with supper.    Zinc Oxide 10 % OINT Apply 1 Application topically as needed.   No facility-administered encounter medications on file as of 08/27/2023.    Review of Systems  Constitutional:  Negative for fatigue and fever.  HENT:  Negative for sore throat and trouble swallowing.   Eyes:  Negative for visual disturbance.   Respiratory:  Negative for cough and shortness of breath.   Cardiovascular:  Negative for chest pain and leg swelling.  Gastrointestinal:  Negative for abdominal distention and abdominal pain.  Genitourinary:  Negative for dysuria and frequency.  Musculoskeletal:  Positive for arthralgias and gait problem.  Skin:  Positive for color change. Negative for wound.  Neurological:  Positive for weakness. Negative for dizziness and headaches.  Psychiatric/Behavioral:  Positive for confusion. Negative for dysphoric mood. The patient is not nervous/anxious.     Immunization History  Administered Date(s) Administered   Fluad Quad(high Dose 65+) 02/04/2019, 03/01/2022   Influenza Whole 02/24/2011, 02/12/2012   Influenza, High Dose Seasonal PF 02/01/2017, 02/25/2018, 02/11/2020, 03/13/2023   Influenza,inj,Quad PF,6+ Mos 02/20/2013, 02/10/2014, 02/19/2015, 02/18/2016   Influenza-Unspecified 02/16/2021   Moderna Covid-19 Fall Seasonal Vaccine 35yrs & older 02/09/2022, 03/21/2023   Moderna Sars-Covid-2 Vaccination 05/19/2019, 06/16/2019, 03/29/2020, 09/27/2020   Pneumococcal Conjugate-13 06/24/2014   Pneumococcal Polysaccharide-23 04/12/1998   Td 02/21/1996, 07/23/2003   Tdap 03/20/2010   Zoster Recombinant(Shingrix) 09/19/2016, 12/02/2016   Zoster, Live 09/15/2005   Pertinent  Health Maintenance Due  Topic Date Due   INFLUENZA VACCINE  12/14/2023   DEXA SCAN  Completed      07/31/2022   11:27 AM 08/23/2022    1:05 PM 09/06/2022    2:27 PM 12/27/2022    2:15 PM 04/18/2023    4:49 PM  Fall Risk  Falls in the past year? 1 1 1  0 0  Was there an injury with Fall? 1 0 1 0 0  Fall Risk Category Calculator 2 1 2  0 0  Patient at Risk for Falls Due to Impaired balance/gait;Impaired mobility History of fall(s) History of fall(s);Impaired balance/gait;Impaired mobility No Fall Risks History of fall(s);Impaired balance/gait;Impaired mobility  Fall risk Follow up Falls evaluation completed;Education  provided;Falls prevention discussed Falls evaluation completed Falls evaluation completed;Education provided;Falls prevention discussed Falls evaluation completed;Education provided;Falls prevention discussed Falls evaluation completed;Education provided;Falls prevention discussed   Functional Status Survey:    Vitals:   08/27/23 0943  BP: (!) 142/92  Pulse: 71  Resp: 18  Temp: 97.8 F (36.6 C)  SpO2: 97%  Weight: 172 lb 1.6 oz (78.1 kg)  Height: 5\' 2"  (1.575 m)   Body mass index is 31.48 kg/m. Physical Exam Vitals reviewed.  Constitutional:      General: She is not in acute distress. HENT:     Head: Normocephalic.     Right Ear: There is impacted cerumen.     Left Ear: There is impacted cerumen.     Nose: Nose normal.     Mouth/Throat:     Mouth: Mucous membranes are moist.  Eyes:     General:        Right eye: No discharge.        Left eye: No discharge.  Cardiovascular:     Rate and Rhythm: Normal rate and regular rhythm.     Pulses: Normal pulses.     Heart sounds: Normal heart sounds.  Pulmonary:     Effort: Pulmonary effort is normal.     Breath sounds: Normal breath sounds.  Abdominal:     General: Bowel sounds are normal.     Palpations: Abdomen is soft.  Musculoskeletal:     Cervical back: Neck supple.     Right lower leg: Edema present.     Left lower leg: Edema present.     Comments: Non pitting  Skin:    General: Skin is warm.     Capillary Refill: Capillary refill takes less than 2 seconds.     Comments: Senile purpura to left and right forearm, L>R, no skin breakdown  Neurological:     General: No focal deficit present.     Mental Status: She is alert and oriented to person, place, and time.  Psychiatric:        Mood and Affect: Mood normal.     Comments: Very pleasant, alert to self/person/place, able to express needs     Labs reviewed: Recent Labs    10/31/22 0000 03/05/23 0000  NA 141 140  K 3.7 4.2  CL 104 105  CO2 24* 27*  BUN  29* 24*  CREATININE 0.8 0.6  CALCIUM 9.5 8.8   Recent Labs    10/31/22 0000 03/05/23 0000  AST 21 14  ALT 18 16  ALKPHOS 71 71  ALBUMIN 4.1 3.4*   Recent Labs    10/31/22 0000 03/05/23 0000  WBC 10.3 7.4  NEUTROABS  --  4,151.00  HGB 13.8 12.2  HCT 42 37  PLT 234 219   Lab Results  Component Value Date   TSH 3.20 10/31/2022   Lab Results  Component Value Date   HGBA1C 5.3 05/02/2013   Lab Results  Component Value Date   CHOL 188 07/10/2019   HDL 75 07/10/2019   LDLCALC 94 07/10/2019   TRIG 96 07/10/2019   CHOLHDL 2.5 07/10/2019    Significant Diagnostic Results in last 30 days:  No results found.  Assessment/Plan 1. Cognitive impairment (Primary) - recent declining BIMS> now 8/15> was 12/15 (11/14) - MMSE 28/30 08/2022 - no behaviors - recent changes in hearing probably also contributing factor - plan to repeat MMSE later this month  2. Essential hypertension - stable with metoprolol  3. TIA (transient ischemic attack) - H/o TIA 2014 - not on statin  - cont Plavix  4. Recurrent UTI - no recent episodes - cont Keflex for UTI prophylaxis  5. Unstable gait - no recent falls - cont skilled nursing - cont PT  6. Sensory hearing loss, bilateral - L>R - recent evaluation with Dr. Darlin Ehrlich - appointment with [facility] in future - conservative management at this time  7. Primary osteoarthritis involving multiple joints - left arm and right ankle most bothersome - cont tylenol and biofreeze  8. Senile osteoporosis - DEXA 2020 - do not recommend aggressive treatment due to age - cont Vitamin D  9. Senile purpura (HCC) - education given  10. Weight gain - 08/2022 weight 147 lbs> now 172.1 lbs  - TSH 2.20 08/16/2023 - cont monthly weights   Family/ staff Communication: plan discussed with patient  and nurse  Labs/tests ordered:  cbc/diff, cmp, TSH 09/03/2023

## 2023-08-27 NOTE — Progress Notes (Signed)
 Subjective:     Patient ID: [name], female   DOB: Jun 04, 1928, 89 y.o.   MRN: [id number] CC:  Routine follow-up visit; concern about left forearm purpura.  HPI A 89 year old female presents for a routine follow-up visit. She expresses concern about a persistent purpura on her left forearm, describing it as purple with varying shades. She states, "it doesn't hurt, but I don't want it there." She denies any associated pain, itching, or trauma to the area. The patient otherwise feels well and denies shortness of breath, cough, chest pain, palpitations, or right ankle or left shoulder pain. She also denies any recent illnesses, fatigue or pain. She reports full adherence to her prescribed medications and is actively participating in physical therapy, tolerating the sessions well. Patient was working with PT at present without any pain or intolerance. The patient remains at her functional baseline, with no recent hospitalizations or emergency department visits.   Review of Systems Constitutional: Denies fever, chills, or fatigue. Reports feeling well overall. Skin: Reports non-tender purpura with variable shades of purple on the left forearm. Denies rash, pruritus, or new skin lesions elsewhere. HEENT: Denies headache, vision changes, nasal congestion, sore throat, or oral ulcers.  Cardiovascular: Denies chest pain, palpitations, dizziness, or orthopnea. Respiratory: Denies shortness of breath, cough, wheezing, or recent respiratory infections symptoms. Gastrointestinal: Denies abdominal pain, nausea, vomiting, diarrhea, or constipation. Genitourinary: Denies dysuria, hematuria, or urinary frequency. Musculoskeletal: Denies joint pain at present. Right ankle and left shoulder pain specifically denied. Tolerates physical therapy well at present time. Neurological: Denies weakness, numbness, headache, or changes in mental status from baseline. Psychiatric: Denies anxiety, depression, or  sleep disturbances. Endocrine: Denies heat or cold intolerance, polyuria, or polydipsia. Weight gain of 25 lbs over one year period (weight 147 lbs a year ago), currently stable at 172 lbs on 08/27/2023, 171 lbs on 07/30/2023, and 171 lbs on 07/17/2023, is noted in setting of normal TSH of 2.20 on April 2025.  Hematologic: Denies easy bruising elsewhere (other than left forearm purpura area), bleeding.     Objective:   Physical Exam Constitutional:      Appearance: Normal appearance. She is obese.  HENT:     Head: Normocephalic and atraumatic.     Right Ear: Tympanic membrane, ear canal and external ear normal.     Left Ear: Tympanic membrane, ear canal and external ear normal.     Nose: Nose normal.     Mouth/Throat:     Mouth: Mucous membranes are moist.     Pharynx: Oropharynx is clear. No oropharyngeal exudate or posterior oropharyngeal erythema.  Eyes:     Extraocular Movements: Extraocular movements intact.     Conjunctiva/sclera: Conjunctivae normal.     Pupils: Pupils are equal, round, and reactive to light.  Cardiovascular:     Rate and Rhythm: Normal rate and regular rhythm.     Pulses: Normal pulses.     Heart sounds: Normal heart sounds. No murmur heard.    No friction rub. No gallop.  Pulmonary:     Effort: Pulmonary effort is normal.     Breath sounds: Normal breath sounds.  Abdominal:     General: Bowel sounds are normal.     Palpations: Abdomen is soft.     Tenderness: There is no abdominal tenderness. There is no guarding.  Musculoskeletal:        General: Swelling present.     Cervical back: Normal range of motion and neck supple.     Comments:  Right lower extremity 1+ swelling non pitting.  Skin:    General: Skin is warm and dry.     Comments: Left forearm purpura with varying shades of purple, without associated skin lesions, breakdown, drainage, pruritus, or pain.   Neurological:     General: No focal deficit present.     Mental Status: She is alert.  Mental status is at baseline.     Cranial Nerves: No cranial nerve deficit.     Sensory: No sensory deficit.     Gait: Gait abnormal.  Psychiatric:        Mood and Affect: Mood normal.        Behavior: Behavior normal.        Thought Content: Thought content normal.        Judgment: Judgment normal.       Assessment & Plan:     Left forearm purpura - Stable and unchanged from baseline. Prescribed Biofreeze for local relief of left forearm purpura and Tylenol PRN for pain if it hurts.  History of Transient Ischemic Attack (TIA) - History noted in 2014; not followed by neurology. Patient remains on clopidogrel (Plavix) 75 mg daily.  Cognitive impairment: MMSE 1 year ago score 28/30, BIMS score 8/15 on 07/10/2023 from score 12/15 on 03/29/2023  Well-controlled essential hypertension (I10) - Latest labs on 03/05/2023: BUN 24, creatinine 0.6. Patient remains on metoprolol succinate (Toprol-XL) 25 mg daily.  Recurrent urinary tract infections - Under urology follow-up. Maintains prophylactic therapy with cephalexin (Keflex).  Unstable gait - Ambulates via wheelchair; requires one-person assist for ambulation. Actively working well with physical therapy.  Hearing loss - Left greater than right. Evaluated by Dr. Darlin Ehrlich on 03/04; conservative management elected. Completed Debrox and ear flush last month. Plan for hearing amplification at [facility]. At present hearing well during this encounter.  Osteoarthritis - Primarily affecting left shoulder and right ankle. Currently working with PT. Denies post-therapy pain exacerbation at this time. Continues Tylenol PRN and topical Biofreeze.  Senile osteoporosis - DEXA scan (2018) T-score: -2.3. Maintains daily vitamin D supplementation.

## 2023-08-28 DIAGNOSIS — M6281 Muscle weakness (generalized): Secondary | ICD-10-CM | POA: Diagnosis not present

## 2023-08-28 DIAGNOSIS — R278 Other lack of coordination: Secondary | ICD-10-CM | POA: Diagnosis not present

## 2023-08-28 DIAGNOSIS — R262 Difficulty in walking, not elsewhere classified: Secondary | ICD-10-CM | POA: Diagnosis not present

## 2023-08-29 DIAGNOSIS — R262 Difficulty in walking, not elsewhere classified: Secondary | ICD-10-CM | POA: Diagnosis not present

## 2023-08-29 DIAGNOSIS — R278 Other lack of coordination: Secondary | ICD-10-CM | POA: Diagnosis not present

## 2023-08-29 DIAGNOSIS — M6281 Muscle weakness (generalized): Secondary | ICD-10-CM | POA: Diagnosis not present

## 2023-08-30 DIAGNOSIS — M6281 Muscle weakness (generalized): Secondary | ICD-10-CM | POA: Diagnosis not present

## 2023-08-30 DIAGNOSIS — R278 Other lack of coordination: Secondary | ICD-10-CM | POA: Diagnosis not present

## 2023-08-30 DIAGNOSIS — R262 Difficulty in walking, not elsewhere classified: Secondary | ICD-10-CM | POA: Diagnosis not present

## 2023-08-31 DIAGNOSIS — R278 Other lack of coordination: Secondary | ICD-10-CM | POA: Diagnosis not present

## 2023-08-31 DIAGNOSIS — R262 Difficulty in walking, not elsewhere classified: Secondary | ICD-10-CM | POA: Diagnosis not present

## 2023-08-31 DIAGNOSIS — M6281 Muscle weakness (generalized): Secondary | ICD-10-CM | POA: Diagnosis not present

## 2023-09-03 DIAGNOSIS — R278 Other lack of coordination: Secondary | ICD-10-CM | POA: Diagnosis not present

## 2023-09-03 DIAGNOSIS — M6281 Muscle weakness (generalized): Secondary | ICD-10-CM | POA: Diagnosis not present

## 2023-09-03 DIAGNOSIS — R262 Difficulty in walking, not elsewhere classified: Secondary | ICD-10-CM | POA: Diagnosis not present

## 2023-09-04 DIAGNOSIS — R262 Difficulty in walking, not elsewhere classified: Secondary | ICD-10-CM | POA: Diagnosis not present

## 2023-09-04 DIAGNOSIS — R278 Other lack of coordination: Secondary | ICD-10-CM | POA: Diagnosis not present

## 2023-09-04 DIAGNOSIS — M6281 Muscle weakness (generalized): Secondary | ICD-10-CM | POA: Diagnosis not present

## 2023-09-05 DIAGNOSIS — R278 Other lack of coordination: Secondary | ICD-10-CM | POA: Diagnosis not present

## 2023-09-05 DIAGNOSIS — R262 Difficulty in walking, not elsewhere classified: Secondary | ICD-10-CM | POA: Diagnosis not present

## 2023-09-05 DIAGNOSIS — M6281 Muscle weakness (generalized): Secondary | ICD-10-CM | POA: Diagnosis not present

## 2023-09-06 DIAGNOSIS — M6281 Muscle weakness (generalized): Secondary | ICD-10-CM | POA: Diagnosis not present

## 2023-09-06 DIAGNOSIS — R278 Other lack of coordination: Secondary | ICD-10-CM | POA: Diagnosis not present

## 2023-09-06 DIAGNOSIS — R262 Difficulty in walking, not elsewhere classified: Secondary | ICD-10-CM | POA: Diagnosis not present

## 2023-09-07 DIAGNOSIS — R262 Difficulty in walking, not elsewhere classified: Secondary | ICD-10-CM | POA: Diagnosis not present

## 2023-09-07 DIAGNOSIS — M6281 Muscle weakness (generalized): Secondary | ICD-10-CM | POA: Diagnosis not present

## 2023-09-07 DIAGNOSIS — R278 Other lack of coordination: Secondary | ICD-10-CM | POA: Diagnosis not present

## 2023-09-09 DIAGNOSIS — R262 Difficulty in walking, not elsewhere classified: Secondary | ICD-10-CM | POA: Diagnosis not present

## 2023-09-09 DIAGNOSIS — R278 Other lack of coordination: Secondary | ICD-10-CM | POA: Diagnosis not present

## 2023-09-09 DIAGNOSIS — M6281 Muscle weakness (generalized): Secondary | ICD-10-CM | POA: Diagnosis not present

## 2023-09-10 DIAGNOSIS — M6281 Muscle weakness (generalized): Secondary | ICD-10-CM | POA: Diagnosis not present

## 2023-09-10 DIAGNOSIS — R278 Other lack of coordination: Secondary | ICD-10-CM | POA: Diagnosis not present

## 2023-09-10 DIAGNOSIS — R262 Difficulty in walking, not elsewhere classified: Secondary | ICD-10-CM | POA: Diagnosis not present

## 2023-09-11 DIAGNOSIS — R262 Difficulty in walking, not elsewhere classified: Secondary | ICD-10-CM | POA: Diagnosis not present

## 2023-09-11 DIAGNOSIS — R278 Other lack of coordination: Secondary | ICD-10-CM | POA: Diagnosis not present

## 2023-09-11 DIAGNOSIS — M6281 Muscle weakness (generalized): Secondary | ICD-10-CM | POA: Diagnosis not present

## 2023-09-12 DIAGNOSIS — R262 Difficulty in walking, not elsewhere classified: Secondary | ICD-10-CM | POA: Diagnosis not present

## 2023-09-12 DIAGNOSIS — M6281 Muscle weakness (generalized): Secondary | ICD-10-CM | POA: Diagnosis not present

## 2023-09-12 DIAGNOSIS — R278 Other lack of coordination: Secondary | ICD-10-CM | POA: Diagnosis not present

## 2023-09-13 DIAGNOSIS — M6281 Muscle weakness (generalized): Secondary | ICD-10-CM | POA: Diagnosis not present

## 2023-09-13 DIAGNOSIS — R262 Difficulty in walking, not elsewhere classified: Secondary | ICD-10-CM | POA: Diagnosis not present

## 2023-09-13 DIAGNOSIS — R278 Other lack of coordination: Secondary | ICD-10-CM | POA: Diagnosis not present

## 2023-09-14 ENCOUNTER — Encounter: Payer: Self-pay | Admitting: Orthopedic Surgery

## 2023-09-14 ENCOUNTER — Non-Acute Institutional Stay: Payer: Self-pay | Admitting: Orthopedic Surgery

## 2023-09-14 DIAGNOSIS — R278 Other lack of coordination: Secondary | ICD-10-CM | POA: Diagnosis not present

## 2023-09-14 DIAGNOSIS — M6281 Muscle weakness (generalized): Secondary | ICD-10-CM | POA: Diagnosis not present

## 2023-09-14 DIAGNOSIS — Z0000 Encounter for general adult medical examination without abnormal findings: Secondary | ICD-10-CM | POA: Diagnosis not present

## 2023-09-14 DIAGNOSIS — R262 Difficulty in walking, not elsewhere classified: Secondary | ICD-10-CM | POA: Diagnosis not present

## 2023-09-14 NOTE — Progress Notes (Signed)
 Subjective:   [name] is a 89 y.o. female who presents for Medicare Annual (Subsequent) preventive examination.  Visit Complete: In person  Patient Medicare AWV questionnaire was completed by the patient on 09/14/2023; I have confirmed that all information answered by patient is correct and no changes since this date.  Cardiac Risk Factors include: advanced age (>53men, >83 women);hypertension;sedentary lifestyle     Objective:    Today's Vitals   09/14/23 1151  BP: 136/86  Pulse: 74  Resp: 20  Temp: 97.9 F (36.6 C)  SpO2: 96%  Weight: 173 lb 9.6 oz (78.7 kg)  Height: 5\' 2"  (1.575 m)   Body mass index is 31.75 kg/m.     06/21/2023   11:20 AM 04/30/2023   11:04 AM 04/23/2023   10:41 AM 03/30/2023   10:02 AM 03/26/2023    9:48 AM 03/19/2023   10:42 AM 03/01/2023    3:20 PM  Advanced Directives  Does Patient Have a Medical Advance Directive? Yes Yes Yes Yes Yes Yes Yes  Type of Estate agent of [place];Living will;Out of facility DNR (pink MOST or yellow form) Healthcare Power of [place];Living will;Out of facility DNR (pink MOST or yellow form) Healthcare Power of [place];Living will;Out of facility DNR (pink MOST or yellow form) Healthcare Power of [place];Living will;Out of facility DNR (pink MOST or yellow form) Healthcare Power of [place];Living will;Out of facility DNR (pink MOST or yellow form) Healthcare Power of [place];Living will;Out of facility DNR (pink MOST or yellow form) Healthcare Power of [place];Living will;Out of facility DNR (pink MOST or yellow form)  Does patient want to make changes to medical advance directive? No - Patient declined No - Patient declined No - Patient declined No - Patient declined No - Patient declined No - Patient declined No - Patient declined  Copy of Healthcare Power of Attorney in Chart? Yes - validated most recent copy scanned in chart (See row information) Yes - validated most recent copy  scanned in chart (See row information) Yes - validated most recent copy scanned in chart (See row information) Yes - validated most recent copy scanned in chart (See row information) Yes - validated most recent copy scanned in chart (See row information) Yes - validated most recent copy scanned in chart (See row information) Yes - validated most recent copy scanned in chart (See row information)  Pre-existing out of facility DNR order (yellow form or pink MOST form) Yellow form placed in chart (order not valid for inpatient use)          Current Medications (verified) Outpatient Encounter Medications as of 09/14/2023  Medication Sig   acetaminophen  (TYLENOL ) 500 MG tablet Take 500 mg by mouth 2 (two) times daily as needed for mild pain or headache.    albuterol  (VENTOLIN  HFA) 108 (90 Base) MCG/ACT inhaler Inhale 2 puffs into the lungs every 6 (six) hours as needed for wheezing or shortness of breath.   cephALEXin  (KEFLEX ) 250 MG capsule Take 250 mg by mouth daily.   Cholecalciferol  (VITAMIN D3) 50 MCG (2000 UT) TABS Take by mouth.   clopidogrel  (PLAVIX ) 75 MG tablet TAKE ONE TABLET BY MOUTH DAILY   Cranberry-Vitamin C-Probiotic (AZO CRANBERRY PO) Take 1 tablet by mouth daily.   Menthol, Topical Analgesic, (BIOFREEZE ROLL-ON) 4 % GEL Apply 1 Application topically in the morning and at bedtime. Apply to right upper arm   Menthol, Topical Analgesic, (BIOFREEZE ROLL-ON) 4 % GEL Apply 1 application  topically 2 (two) times  daily. Apply to right knee   metoprolol  succinate (TOPROL -XL) 25 MG 24 hr tablet Take one tablet by mouth once daily to regulate heart and control blood pressure.   Multiple Vitamins-Minerals (PRESERVISION AREDS 2 PO) Take 1 capsule by mouth 2 (two) times daily.    vitamin C (ASCORBIC ACID) 500 MG tablet Take 500 mg by mouth daily with supper.    Zinc Oxide 10 % OINT Apply 1 Application topically as needed.   No facility-administered encounter medications on file as of 09/14/2023.     Allergies (verified) Trimethoprim, Erythromycin, Macrodantin [nitrofurantoin macrocrystal], Other, Penicillins, Sulfa antibiotics, and Latex   History: Past Medical History:  Diagnosis Date   Cervicalgia    Chest pain at rest 01/07/2012   Displacement of cervical intervertebral disc without myelopathy    Diverticulosis of colon (without mention of hemorrhage)    Female stress incontinence    GERD (gastroesophageal reflux disease)    no peds occasional pepcid   Inguinal hernia without mention of obstruction or gangrene, unilateral or unspecified, (not specified as recurrent)    Internal hemorrhoids without mention of complication    Lumbago    Macular degeneration (senile) of retina, unspecified    Osteoarthrosis, unspecified whether generalized or localized, unspecified site    Other and unspecified hyperlipidemia    Pain in joint, hand    Palpitations    Reflux esophagitis    Scoliosis (and kyphoscoliosis), idiopathic    Senile osteoporosis    Skin disorder    Thyroid  disease    TIA (transient ischemic attack)    Unruptured popliteal cyst 06/24/2014   Right knee    Unspecified essential hypertension    Unspecified glaucoma(365.9)    Unspecified hypothyroidism    Unspecified vitamin D  deficiency    Past Surgical History:  Procedure Laterality Date   ABDOMINAL HYSTERECTOMY  1975   Dr Delaine Favorite   APPENDECTOMY  1988   cardiolite myocardial perfusion study     DOPPLER ECHOCARDIOGRAPHY     EYE SURGERY Bilateral 2009   cataract removed right eye, Dr Neil Balls   FOOT SURGERY  august 2013   Hewitt, MD   INGUINAL HERNIA REPAIR Bilateral    w/mesh   INGUINAL HERNIA REPAIR Left 08/03/2017   Procedure: LAPAROSCOPIC LEFT INGUINAL HERNIA REPAIR WITH MESH;  Surgeon: Aldean Hummingbird, MD;  Location: [facility] OR;  Service: General;  Laterality: Left;   INGUINAL HERNIA REPAIR Right 08/03/2017   Procedure: HERNIA REPAIR RIGHT INGUINAL ADULT WITH MESH;  Surgeon: Aldean Hummingbird, MD;  Location: [facility] OR;   Service: General;  Laterality: Right;   INGUINAL HERNIA REPAIR     Dr. Elvan Hamel 04-24-18   INGUINAL HERNIA REPAIR Right 04/24/2018   Procedure: DIAGNOSTIC LAPAROSCOPIC, OPENREPAIR OF RECURRENT RIGHT INGUINAL HERNIA WITH MESH ERAS PATHWAY;  Surgeon: Aldean Hummingbird, MD;  Location: [facility] ORS;  Service: General;  Laterality: Right;   INSERTION OF MESH Bilateral 08/03/2017   Procedure: INSERTION OF MESH;  Surgeon: Aldean Hummingbird, MD;  Location: [facility] OR;  Service: General;  Laterality: Bilateral;   NM MYOVIEW  LTD     negative   TONSILLECTOMY  1937   TOOTH EXTRACTION  09/16/13   Dr Isabell Manzanilla   Family History  Problem Relation Age of Onset   CAD Mother    Parkinson's disease Mother    Cancer Father        colon cancer   Parkinson's disease Father    Stroke Maternal Grandmother    Social History   Socioeconomic History   Marital status:  Widowed    Spouse name: Not on file   Number of children: 2   Years of education: college   Highest education level: Not on file  Occupational History    Comment: retired  Tobacco Use   Smoking status: Never   Smokeless tobacco: Never  Vaping Use   Vaping status: Never Used  Substance and Sexual Activity   Alcohol  use: No   Drug use: No   Sexual activity: Not Currently  Other Topics Concern   Not on file  Social History Narrative   Patient lives at home with her daughter Tanis Fan) , moved to [facility] 02/02/16 [place]    Widowed.   Retired.   EducationPsychiatrist.   Right handed.   Caffeine- None some times tea very rare.   Never smoked   Alcohol  none   Social Drivers of Corporate investment banker Strain: Low Risk  (09/14/2023)   Overall Financial Resource Strain (CARDIA)    Difficulty of Paying Living Expenses: Not hard at all  Food Insecurity: No Food Insecurity (09/14/2023)   Hunger Vital Sign    Worried About Running Out of Food in the Last Year: Never true    Ran Out of Food in the Last Year: Never true  Transportation Needs: No  Transportation Needs (09/14/2023)   PRAPARE - Administrator, Civil Service (Medical): No    Lack of Transportation (Non-Medical): No  Physical Activity: Insufficiently Active (09/14/2023)   Exercise Vital Sign    Days of Exercise per Week: 3 days    Minutes of Exercise per Session: 10 min  Stress: No Stress Concern Present (09/14/2023)   [employer] of Occupational Health - Occupational Stress Questionnaire    Feeling of Stress : Not at all  Social Connections: Moderately Integrated (09/14/2023)   Social Connection and Isolation Panel [NHANES]    Frequency of Communication with Friends and Family: More than three times a week    Frequency of Social Gatherings with Friends and Family: Three times a week    Attends Religious Services: 1 to 4 times per year    Active Member of Clubs or Organizations: Yes    Attends Banker Meetings: 1 to 4 times per year    Marital Status: Widowed    Tobacco Counseling Counseling given: Not Answered   Clinical Intake:  Pre-visit preparation completed: Yes  Pain : No/denies pain     BMI - recorded: 31.75 Nutritional Status: BMI > 30  Obese Nutritional Risks: None Diabetes: No  How often do you need to have someone help you when you read instructions, pamphlets, or other written materials from your doctor or pharmacy?: 3 - Sometimes What is the last grade level you completed in school?: college  Interpreter Needed?: No      Activities of Daily Living    09/14/2023   12:11 PM  In your present state of health, do you have any difficulty performing the following activities:  Hearing? 1  Vision? 1  Difficulty concentrating or making decisions? 0  Walking or climbing stairs? 1  Dressing or bathing? 1  Doing errands, shopping? 1  Preparing Food and eating ? Y  Using the Toilet? Y  In the past six months, have you accidently leaked urine? Y  Do you have problems with loss of bowel control? Y  Managing your  Medications? Y  Managing your Finances? Y  Housekeeping or managing your Housekeeping? Y    Patient Care Team: Venice Gillis,  Pressley Brome, MD as PCP - General (Internal Medicine) Avanell Leigh, MD as PCP - Cardiology (Cardiology) Jolinda Necessary, MD (Inactive) as Consulting Physician (Gastroenterology) Orvan Blanch, MD as Attending Physician (Urology) Avanell Leigh, MD as Consulting Physician (Cardiology) Prescott Brodie, MD (Inactive) as Consulting Physician (Otolaryngology) Bartholome Boron, DDS (Dentistry) Oris Birmingham, MD as Consulting Physician (Ophthalmology) Arvil Birks, MD as Consulting Physician (Orthopedic Surgery) Aldean Hummingbird, MD as Consulting Physician (General Surgery)  Indicate any recent Medical Services you may have received from other than [facility] providers in the past year (date may be approximate).     Assessment:   This is a routine wellness examination for [facility]  Hearing/Vision screen No results found.   Goals Addressed             This Visit's Progress    Increase physical activity   Not on track    Starting 06/27/16, I will attempt to increase my physical activity.        Depression Screen    09/14/2023   12:17 PM 08/27/2023    2:51 PM 04/18/2023    4:49 PM 11/16/2020    1:22 PM 06/14/2020    2:31 PM 11/13/2019   11:17 AM 10/27/2019   10:08 AM  PHQ 2/9 Scores  PHQ - 2 Score 0 0 0 0 0 0 0    Fall Risk    09/14/2023   12:18 PM 08/27/2023    2:51 PM 04/18/2023    4:49 PM 12/27/2022    2:15 PM 09/06/2022    2:27 PM  Fall Risk   Falls in the past year? 1 1 0 0 1  Number falls in past yr: 0 0 0 0 0  Injury with Fall? 0 0 0 0 1  Risk for fall due to : History of fall(s);Impaired balance/gait History of fall(s);Impaired mobility History of fall(s);Impaired balance/gait;Impaired mobility No Fall Risks History of fall(s);Impaired balance/gait;Impaired mobility  Follow up Falls evaluation completed;Education provided Falls evaluation  completed;Education provided Falls evaluation completed;Education provided;Falls prevention discussed Falls evaluation completed;Education provided;Falls prevention discussed Falls evaluation completed;Education provided;Falls prevention discussed    MEDICARE RISK AT HOME: Medicare Risk at Home Any stairs in or around the home?: No If so, are there any without handrails?: No Home free of loose throw rugs in walkways, pet beds, electrical cords, etc?: Yes Adequate lighting in your home to reduce risk of falls?: Yes Life alert?: No Use of a cane, walker or w/c?: Yes Grab bars in the bathroom?: Yes Shower chair or bench in shower?: Yes Elevated toilet seat or a handicapped toilet?: Yes  TIMED UP AND GO:  Was the test performed?  No    Cognitive Function:    09/14/2023   12:19 PM 09/06/2022    2:04 PM 10/15/2017    1:58 PM 06/27/2016    2:36 PM 06/22/2015    3:24 PM  MMSE - Mini Mental State Exam  Not completed:     --  Orientation to time 3 5 5 5 5   Orientation to Place 3 5 5 5 5   Registration 3 2 3 3 3   Attention/ Calculation 5 5 5 5 5   Recall 0 2 3 1 1   Language- name 2 objects 2 2 2 2 2   Language- repeat 1 1 1 1 1   Language- follow 3 step command 3 3 3 3 3   Language- read & follow direction 1 1 1 1 1   Write a sentence 1 1 1 1  1  Copy design 0 1 1 1 1   Total score 22 28 30 28 28         11/16/2020    1:28 PM 10/27/2019   10:09 AM 10/18/2018    1:42 PM  6CIT Screen  What Year? 0 points 0 points 0 points  What month? 0 points 0 points 0 points  What time? 0 points 0 points 0 points  Count back from 20 0 points 0 points 0 points  Months in reverse 0 points 0 points 0 points  Repeat phrase 6 points 2 points 0 points  Total Score 6 points 2 points 0 points    Immunizations Immunization History  Administered Date(s) Administered   Fluad Quad(high Dose 65+) 02/04/2019, 03/01/2022   Influenza Whole 02/24/2011, 02/12/2012   Influenza, High Dose Seasonal PF 02/01/2017,  02/25/2018, 02/11/2020, 03/13/2023   Influenza,inj,Quad PF,6+ Mos 02/20/2013, 02/10/2014, 02/19/2015, 02/18/2016   Influenza-Unspecified 02/16/2021   Moderna Covid-19 Fall Seasonal Vaccine 23yrs & older 02/09/2022, 03/21/2023   Moderna Sars-Covid-2 Vaccination 05/19/2019, 06/16/2019, 03/29/2020, 09/27/2020   Pneumococcal Conjugate-13 06/24/2014   Pneumococcal Polysaccharide-23 04/12/1998   Td 02/21/1996, 07/23/2003   Tdap 03/20/2010   Zoster Recombinant(Shingrix) 09/19/2016, 12/02/2016   Zoster, Live 09/15/2005    TDAP status: Due, Education has been provided regarding the importance of this vaccine. Advised may receive this vaccine at local pharmacy or Health Dept. Aware to provide a copy of the vaccination record if obtained from local pharmacy or Health Dept. Verbalized acceptance and understanding.  Flu Vaccine status: Up to date  Pneumococcal vaccine status: Up to date  Covid-19 vaccine status: Completed vaccines  Qualifies for Shingles Vaccine? Yes   Zostavax completed Yes   Shingrix Completed?: Yes  Screening Tests Health Maintenance  Topic Date Due   DTaP/Tdap/Td (4 - Td or Tdap) 03/20/2020   COVID-19 Vaccine (7 - 2024-25 season) 09/18/2023   INFLUENZA VACCINE  12/14/2023   Medicare Annual Wellness (AWV)  09/13/2024   Pneumonia Vaccine 67+ Years old  Completed   DEXA SCAN  Completed   Zoster Vaccines- Shingrix  Completed   HPV VACCINES  Aged Out   Meningococcal B Vaccine  Aged Out    Health Maintenance  Health Maintenance Due  Topic Date Due   DTaP/Tdap/Td (4 - Td or Tdap) 03/20/2020   COVID-19 Vaccine (7 - 2024-25 season) 09/18/2023    Colorectal cancer screening: No longer required.   Mammogram status: No longer required due to advanced age.  Bone Density status: Completed 03/2019. Results reflect: Bone density results: OSTEOPOROSIS. Repeat every 3 years.  Lung Cancer Screening: (Low Dose CT Chest recommended if Age 10-80 years, 20 pack-year currently  smoking OR have quit w/in 15years.) does not qualify.   Lung Cancer Screening Referral: No  Additional Screening:  Hepatitis C Screening: does not qualify; Completed   Vision Screening: Recommended annual ophthalmology exams for early detection of glaucoma and other disorders of the eye. Is the patient up to date with their annual eye exam?  Yes  Who is the provider or what is the name of the office in which the patient attends annual eye exams? Dr. Lydia Sams If pt is not established with a provider, would they like to be referred to a provider to establish care? No .   Dental Screening: Recommended annual dental exams for proper oral hygiene  Diabetic Foot Exam: Diabetic Foot Exam: Completed 09/13/2023  Community Resource Referral / Chronic Care Management: CRR required this visit?  No   CCM required this visit?  No     Plan:     I have personally reviewed and noted the following in the patient's chart:   Medical and social history Use of alcohol , tobacco or illicit drugs  Current medications and supplements including opioid prescriptions. Patient is not currently taking opioid prescriptions. Functional ability and status Nutritional status Physical activity Advanced directives List of other physicians Hospitalizations, surgeries, and ER visits in previous 12 months Vitals Screenings to include cognitive, depression, and falls Referrals and appointments  In addition, I have reviewed and discussed with patient certain preventive protocols, quality metrics, and best practice recommendations. A written personalized care plan for preventive services as well as general preventive health recommendations were provided to patient.     Arnetha Bhat, NP   09/14/2023   After Visit Summary: (MyChart) Due to this being a telephonic visit, the after visit summary with patients personalized plan was offered to patient via MyChart   Nurse Notes: MMSE 22/30. Lives in skilled nursing. Does  not want Tdap or covid booster at this time.

## 2023-09-15 DIAGNOSIS — R278 Other lack of coordination: Secondary | ICD-10-CM | POA: Diagnosis not present

## 2023-09-15 DIAGNOSIS — M6281 Muscle weakness (generalized): Secondary | ICD-10-CM | POA: Diagnosis not present

## 2023-09-15 DIAGNOSIS — R262 Difficulty in walking, not elsewhere classified: Secondary | ICD-10-CM | POA: Diagnosis not present

## 2023-09-17 DIAGNOSIS — R278 Other lack of coordination: Secondary | ICD-10-CM | POA: Diagnosis not present

## 2023-09-17 DIAGNOSIS — M6281 Muscle weakness (generalized): Secondary | ICD-10-CM | POA: Diagnosis not present

## 2023-09-17 DIAGNOSIS — R262 Difficulty in walking, not elsewhere classified: Secondary | ICD-10-CM | POA: Diagnosis not present

## 2023-09-19 DIAGNOSIS — R278 Other lack of coordination: Secondary | ICD-10-CM | POA: Diagnosis not present

## 2023-09-19 DIAGNOSIS — R262 Difficulty in walking, not elsewhere classified: Secondary | ICD-10-CM | POA: Diagnosis not present

## 2023-09-19 DIAGNOSIS — M6281 Muscle weakness (generalized): Secondary | ICD-10-CM | POA: Diagnosis not present

## 2023-09-20 ENCOUNTER — Encounter: Payer: Self-pay | Admitting: Internal Medicine

## 2023-09-20 ENCOUNTER — Non-Acute Institutional Stay (SKILLED_NURSING_FACILITY): Payer: Self-pay | Admitting: Internal Medicine

## 2023-09-20 DIAGNOSIS — M6281 Muscle weakness (generalized): Secondary | ICD-10-CM | POA: Diagnosis not present

## 2023-09-20 DIAGNOSIS — G459 Transient cerebral ischemic attack, unspecified: Secondary | ICD-10-CM

## 2023-09-20 DIAGNOSIS — M150 Primary generalized (osteo)arthritis: Secondary | ICD-10-CM

## 2023-09-20 DIAGNOSIS — N390 Urinary tract infection, site not specified: Secondary | ICD-10-CM

## 2023-09-20 DIAGNOSIS — R278 Other lack of coordination: Secondary | ICD-10-CM | POA: Diagnosis not present

## 2023-09-20 DIAGNOSIS — R2681 Unsteadiness on feet: Secondary | ICD-10-CM | POA: Diagnosis not present

## 2023-09-20 DIAGNOSIS — I10 Essential (primary) hypertension: Secondary | ICD-10-CM

## 2023-09-20 DIAGNOSIS — R4189 Other symptoms and signs involving cognitive functions and awareness: Secondary | ICD-10-CM

## 2023-09-20 DIAGNOSIS — R262 Difficulty in walking, not elsewhere classified: Secondary | ICD-10-CM | POA: Diagnosis not present

## 2023-09-20 NOTE — Progress Notes (Signed)
 Location:  [facility] Room Number: 14-A [name]:  SNF [id number]) Provider:  Marguerite Shiley, MD   Marguerite Shiley, MD  Patient Care Team: Marguerite Shiley, MD as PCP - General (Internal Medicine) Avanell Leigh, MD as PCP - Cardiology (Cardiology) Jolinda Necessary, MD (Inactive) as Consulting Physician (Gastroenterology) Orvan Blanch, MD as Attending Physician (Urology) Avanell Leigh, MD as Consulting Physician (Cardiology) Prescott Brodie, MD (Inactive) as Consulting Physician (Otolaryngology) Bartholome Boron, DDS (Dentistry) Oris Birmingham, MD as Consulting Physician (Ophthalmology) Arvil Birks, MD as Consulting Physician (Orthopedic Surgery) Aldean Hummingbird, MD as Consulting Physician (General Surgery)  Extended Emergency Contact Information Primary Emergency Contact: [facility] Address: [address]          [place], [place] [address] [place] Home Phone: [phone] Mobile Phone: [phone] Relation: Son Secondary Emergency Contact: Jerene Monks Address: [address]          [place], [place] [address] [place] Home Phone: [phone] Work Phone: [phone] Mobile Phone: [phone] Relation: Daughter  Code Status:  DNR Goals of care: Advanced Directive information    06/21/2023   11:20 AM  Advanced Directives  Does Patient Have a Medical Advance Directive? Yes  Type of Estate agent of [place];Living will;Out of facility DNR (pink MOST or yellow form)  Does patient want to make changes to medical advance directive? No - Patient declined  Copy of Healthcare Power of Attorney in Chart? Yes - validated most recent copy scanned in chart (See row information)  Pre-existing out of facility DNR order (yellow form or pink MOST form) Yellow form placed in chart (order not valid for inpatient use)     Chief Complaint  Patient presents with   routine visit    HPI:   Pt is a 89 y.o. female seen today for medical management of chronic diseases.   Lives in SNF in [facility]   Patient has a history of hypertension, hyperlipidemia, macular degeneration, mild cognitive impairment, recurrent UTI, GERD, H/o TIA      She is stable. No new Nursing issues. No Behavior issues Her weight is stable Needs help with Transfers 1-2 assist Stays in Wheelchair No Falls Wt Readings from Last 3 Encounters:  09/20/23 173 lb 9.6 oz (78.7 kg)  09/14/23 173 lb 9.6 oz (78.7 kg)  08/27/23 172 lb 1.6 oz (78.1 kg)    Past Medical History:  Diagnosis Date   Cervicalgia    Chest pain at rest 01/07/2012   Displacement of cervical intervertebral disc without myelopathy    Diverticulosis of colon (without mention of hemorrhage)    Female stress incontinence    GERD (gastroesophageal reflux disease)    no peds occasional pepcid   Inguinal hernia without mention of obstruction or gangrene, unilateral or unspecified, (not specified as recurrent)    Internal hemorrhoids without mention of complication    Lumbago    Macular degeneration (senile) of retina, unspecified    Osteoarthrosis, unspecified whether generalized or localized, unspecified site    Other and unspecified hyperlipidemia    Pain in joint, hand    Palpitations    Reflux esophagitis    Scoliosis (and kyphoscoliosis), idiopathic    Senile osteoporosis    Skin disorder    Thyroid  disease    TIA (transient ischemic attack)    Unruptured popliteal cyst 06/24/2014   Right knee    Unspecified essential hypertension    Unspecified glaucoma(365.9)  Unspecified hypothyroidism    Unspecified vitamin D  deficiency    Past Surgical History:  Procedure Laterality Date   ABDOMINAL HYSTERECTOMY  1975   Dr Delaine Favorite   APPENDECTOMY  1988   cardiolite myocardial perfusion study     DOPPLER ECHOCARDIOGRAPHY     EYE SURGERY Bilateral 2009   cataract removed right eye, Dr Neil Balls   FOOT SURGERY  august 2013   Hewitt, MD    INGUINAL HERNIA REPAIR Bilateral    w/mesh   INGUINAL HERNIA REPAIR Left 08/03/2017   Procedure: LAPAROSCOPIC LEFT INGUINAL HERNIA REPAIR WITH MESH;  Surgeon: Aldean Hummingbird, MD;  Location: [facility] OR;  Service: General;  Laterality: Left;   INGUINAL HERNIA REPAIR Right 08/03/2017   Procedure: HERNIA REPAIR RIGHT INGUINAL ADULT WITH MESH;  Surgeon: Aldean Hummingbird, MD;  Location: [facility] OR;  Service: General;  Laterality: Right;   INGUINAL HERNIA REPAIR     Dr. Elvan Hamel 04-24-18   INGUINAL HERNIA REPAIR Right 04/24/2018   Procedure: DIAGNOSTIC LAPAROSCOPIC, OPENREPAIR OF RECURRENT RIGHT INGUINAL HERNIA WITH MESH ERAS PATHWAY;  Surgeon: Aldean Hummingbird, MD;  Location: [facility] ORS;  Service: General;  Laterality: Right;   INSERTION OF MESH Bilateral 08/03/2017   Procedure: INSERTION OF MESH;  Surgeon: Aldean Hummingbird, MD;  Location: [facility] OR;  Service: General;  Laterality: Bilateral;   NM MYOVIEW  LTD     negative   TONSILLECTOMY  1937   TOOTH EXTRACTION  09/16/13   Dr Isabell Manzanilla    Allergies  Allergen Reactions   Trimethoprim Other (See Comments)    Headaches/ ear pressure    Erythromycin Other (See Comments)    UNSPECIFIED REACTION    Macrodantin [Nitrofurantoin Macrocrystal] Other (See Comments)    UNSPECIFIED REACTION    Other     Honeydew Melon   Penicillins Other (See Comments)    UNSPECIFIED REACTION  Has patient had a PCN reaction causing immediate rash, facial/tongue/throat swelling, SOB or lightheadedness with hypotension: Unknown Has patient had a PCN reaction causing severe rash involving mucus membranes or skin necrosis: Unknown Has patient had a PCN reaction that required hospitalization: No Has patient had a PCN reaction occurring within the last 10 years: No If all of the above answers are "NO", then may proceed with Cephalosporin use.   Sulfa Antibiotics Other (See Comments)    UNSPECIFIED REACTION    Latex Itching    Outpatient Encounter Medications as of 09/20/2023  Medication Sig    acetaminophen  (TYLENOL ) 500 MG tablet Take 500 mg by mouth 2 (two) times daily as needed for mild pain or headache.    albuterol  (VENTOLIN  HFA) 108 (90 Base) MCG/ACT inhaler Inhale 2 puffs into the lungs every 6 (six) hours as needed for wheezing or shortness of breath.   cephALEXin  (KEFLEX ) 250 MG capsule Take 250 mg by mouth daily.   Cholecalciferol  (VITAMIN D3) 50 MCG (2000 UT) TABS Take by mouth.   clopidogrel  (PLAVIX ) 75 MG tablet TAKE ONE TABLET BY MOUTH DAILY   Cranberry-Vitamin C-Probiotic (AZO CRANBERRY PO) Take 1 tablet by mouth daily.   Menthol, Topical Analgesic, (BIOFREEZE ROLL-ON) 4 % GEL Apply 1 Application topically in the morning and at bedtime. Apply to right upper arm   Menthol, Topical Analgesic, (BIOFREEZE ROLL-ON) 4 % GEL Apply 1 application  topically 2 (two) times daily. Apply to right knee   metoprolol  succinate (TOPROL -XL) 25 MG 24 hr tablet Take one tablet by mouth once daily to regulate heart and control blood pressure.   Multiple Vitamins-Minerals (PRESERVISION AREDS 2  PO) Take 1 capsule by mouth 2 (two) times daily.    vitamin C (ASCORBIC ACID) 500 MG tablet Take 500 mg by mouth daily with supper.    Zinc Oxide 10 % OINT Apply 1 Application topically as needed.   No facility-administered encounter medications on file as of 09/20/2023.    Review of Systems  Constitutional:  Negative for activity change and appetite change.  HENT: Negative.    Respiratory:  Negative for cough and shortness of breath.   Cardiovascular:  Positive for leg swelling.  Gastrointestinal:  Negative for constipation.  Genitourinary: Negative.   Musculoskeletal:  Positive for arthralgias and gait problem. Negative for myalgias.  Skin: Negative.   Neurological:  Negative for dizziness and weakness.  Psychiatric/Behavioral:  Positive for confusion. Negative for dysphoric mood and sleep disturbance.     Immunization History  Administered Date(s) Administered   Fluad Quad(high Dose 65+)  02/04/2019, 03/01/2022   Influenza Whole 02/24/2011, 02/12/2012   Influenza, High Dose Seasonal PF 02/01/2017, 02/25/2018, 02/11/2020, 03/13/2023   Influenza,inj,Quad PF,6+ Mos 02/20/2013, 02/10/2014, 02/19/2015, 02/18/2016   Influenza-Unspecified 02/16/2021   Moderna Covid-19 Fall Seasonal Vaccine 6yrs & older 02/09/2022, 03/21/2023   Moderna Sars-Covid-2 Vaccination 05/19/2019, 06/16/2019, 03/29/2020, 09/27/2020   Pneumococcal Conjugate-13 06/24/2014   Pneumococcal Polysaccharide-23 04/12/1998   Td 02/21/1996, 07/23/2003   Tdap 03/20/2010   Zoster Recombinant(Shingrix) 09/19/2016, 12/02/2016   Zoster, Live 09/15/2005   Pertinent  Health Maintenance Due  Topic Date Due   INFLUENZA VACCINE  12/14/2023   DEXA SCAN  Completed      09/06/2022    2:27 PM 12/27/2022    2:15 PM 04/18/2023    4:49 PM 08/27/2023    2:51 PM 09/14/2023   12:18 PM  Fall Risk  Falls in the past year? 1 0 0 1 1  Was there an injury with Fall? 1 0 0 0 0  Fall Risk Category Calculator 2 0 0 1 1  Patient at Risk for Falls Due to History of fall(s);Impaired balance/gait;Impaired mobility No Fall Risks History of fall(s);Impaired balance/gait;Impaired mobility History of fall(s);Impaired mobility History of fall(s);Impaired balance/gait  Fall risk Follow up Falls evaluation completed;Education provided;Falls prevention discussed Falls evaluation completed;Education provided;Falls prevention discussed Falls evaluation completed;Education provided;Falls prevention discussed Falls evaluation completed;Education provided Falls evaluation completed;Education provided   Functional Status Survey:    Vitals:   09/20/23 1041  BP: (!) 156/83  Pulse: 71  Resp: 18  Temp: 97.6 F (36.4 C)  SpO2: 93%  Weight: 173 lb 9.6 oz (78.7 kg)  Height: 5\' 2"  (1.575 m)   Body mass index is 31.75 kg/m. Physical Exam Vitals reviewed.  Constitutional:      Appearance: Normal appearance.  HENT:     Head: Normocephalic.     Nose:  Nose normal.     Mouth/Throat:     Mouth: Mucous membranes are moist.     Pharynx: Oropharynx is clear.  Eyes:     Pupils: Pupils are equal, round, and reactive to light.  Cardiovascular:     Rate and Rhythm: Normal rate and regular rhythm.     Pulses: Normal pulses.     Heart sounds: Normal heart sounds. No murmur heard. Pulmonary:     Effort: Pulmonary effort is normal.     Breath sounds: Normal breath sounds.  Abdominal:     General: Abdomen is flat. Bowel sounds are normal.     Palpations: Abdomen is soft.  Musculoskeletal:        General: No swelling.  Cervical back: Neck supple.     Comments: Mild edema Bilateral  Skin:    General: Skin is warm.  Neurological:     General: No focal deficit present.     Mental Status: She is alert.  Psychiatric:        Mood and Affect: Mood normal.        Thought Content: Thought content normal.     Labs reviewed: Recent Labs    10/31/22 0000 03/05/23 0000  NA 141 140  K 3.7 4.2  CL 104 105  CO2 24* 27*  BUN 29* 24*  CREATININE 0.8 0.6  CALCIUM  9.5 8.8   Recent Labs    10/31/22 0000 03/05/23 0000  AST 21 14  ALT 18 16  ALKPHOS 71 71  ALBUMIN 4.1 3.4*   Recent Labs    10/31/22 0000 03/05/23 0000  WBC 10.3 7.4  NEUTROABS  --  4,151.00  HGB 13.8 12.2  HCT 42 37  PLT 234 219   Lab Results  Component Value Date   TSH 3.20 10/31/2022   Lab Results  Component Value Date   HGBA1C 5.3 05/02/2013   Lab Results  Component Value Date   CHOL 188 07/10/2019   HDL 75 07/10/2019   LDLCALC 94 07/10/2019   TRIG 96 07/10/2019   CHOLHDL 2.5 07/10/2019    Significant Diagnostic Results in last 30 days:  No results found.  Assessment/Plan 1. Essential hypertension (Primary) Metoprolol   2. TIA (transient ischemic attack) Plavix  No statin due to Age  56. Recurrent UTI Keflex   4. Primary osteoarthritis involving multiple joints Uses tylenol  Brace for the hand at night Not Ambulatory now  5. Unstable  gait Uses wheelchair SNF level of care  6. Cognitive impairment BIMS is 8/15 now  Labs Done recently were all in Good Limits   Family/ staff Communication:   Labs/tests ordered:

## 2023-09-21 DIAGNOSIS — M6281 Muscle weakness (generalized): Secondary | ICD-10-CM | POA: Diagnosis not present

## 2023-09-21 DIAGNOSIS — R278 Other lack of coordination: Secondary | ICD-10-CM | POA: Diagnosis not present

## 2023-09-21 DIAGNOSIS — R262 Difficulty in walking, not elsewhere classified: Secondary | ICD-10-CM | POA: Diagnosis not present

## 2023-09-22 DIAGNOSIS — R278 Other lack of coordination: Secondary | ICD-10-CM | POA: Diagnosis not present

## 2023-09-22 DIAGNOSIS — R262 Difficulty in walking, not elsewhere classified: Secondary | ICD-10-CM | POA: Diagnosis not present

## 2023-09-22 DIAGNOSIS — M6281 Muscle weakness (generalized): Secondary | ICD-10-CM | POA: Diagnosis not present

## 2023-09-24 DIAGNOSIS — M6281 Muscle weakness (generalized): Secondary | ICD-10-CM | POA: Diagnosis not present

## 2023-09-24 DIAGNOSIS — R278 Other lack of coordination: Secondary | ICD-10-CM | POA: Diagnosis not present

## 2023-09-24 DIAGNOSIS — R262 Difficulty in walking, not elsewhere classified: Secondary | ICD-10-CM | POA: Diagnosis not present

## 2023-09-25 DIAGNOSIS — R278 Other lack of coordination: Secondary | ICD-10-CM | POA: Diagnosis not present

## 2023-09-25 DIAGNOSIS — R262 Difficulty in walking, not elsewhere classified: Secondary | ICD-10-CM | POA: Diagnosis not present

## 2023-09-25 DIAGNOSIS — M6281 Muscle weakness (generalized): Secondary | ICD-10-CM | POA: Diagnosis not present

## 2023-09-26 ENCOUNTER — Non-Acute Institutional Stay (SKILLED_NURSING_FACILITY): Payer: Self-pay | Admitting: Orthopedic Surgery

## 2023-09-26 ENCOUNTER — Encounter: Payer: Self-pay | Admitting: Orthopedic Surgery

## 2023-09-26 DIAGNOSIS — S81812A Laceration without foreign body, left lower leg, initial encounter: Secondary | ICD-10-CM

## 2023-09-26 DIAGNOSIS — S81811A Laceration without foreign body, right lower leg, initial encounter: Secondary | ICD-10-CM

## 2023-09-26 DIAGNOSIS — H353221 Exudative age-related macular degeneration, left eye, with active choroidal neovascularization: Secondary | ICD-10-CM | POA: Diagnosis not present

## 2023-09-26 DIAGNOSIS — R600 Localized edema: Secondary | ICD-10-CM

## 2023-09-26 DIAGNOSIS — R278 Other lack of coordination: Secondary | ICD-10-CM | POA: Diagnosis not present

## 2023-09-26 DIAGNOSIS — M6281 Muscle weakness (generalized): Secondary | ICD-10-CM | POA: Diagnosis not present

## 2023-09-26 DIAGNOSIS — R262 Difficulty in walking, not elsewhere classified: Secondary | ICD-10-CM | POA: Diagnosis not present

## 2023-09-26 NOTE — Progress Notes (Signed)
 Location:  [facility] Room Number: n14A [name]:  SNF [phone]) Provider:  Arnetha Bhat, NP  Patient Care Team: Marguerite Shiley, MD as PCP - General (Internal Medicine) Avanell Leigh, MD as PCP - Cardiology (Cardiology) Jolinda Necessary, MD (Inactive) as Consulting Physician (Gastroenterology) Orvan Blanch, MD as Attending Physician (Urology) Avanell Leigh, MD as Consulting Physician (Cardiology) Prescott Brodie, MD (Inactive) as Consulting Physician (Otolaryngology) Bartholome Boron, DDS (Dentistry) Oris Birmingham, MD as Consulting Physician (Ophthalmology) Arvil Birks, MD as Consulting Physician (Orthopedic Surgery) Aldean Hummingbird, MD as Consulting Physician (General Surgery)  Extended Emergency Contact Information Primary Emergency Contact: [facility] Address: [address]          [place], [place] [address] [place] Home Phone: [phone] Mobile Phone: [phone] Relation: Son Secondary Emergency Contact: Jerene Monks Address: [address]          [place], [place] [address] [place] Home Phone: [phone] Work Phone: [phone] Mobile Phone: [phone] Relation: Daughter  Code Status:  DNR Goals of care: Advanced Directive information    09/26/2023    9:26 AM  Advanced Directives  Does Patient Have a Medical Advance Directive? Yes  Type of Estate agent of [place];Out of facility DNR (pink MOST or yellow form);Living will  Does patient want to make changes to medical advance directive? No - Patient declined  Copy of Healthcare Power of Attorney in Chart? Yes - validated most recent copy scanned in chart (See row information)  Pre-existing out of facility DNR order (yellow form or pink MOST form) Pink MOST/Yellow Form most recent copy in chart - Physician notified to receive inpatient order     Chief Complaint  Patient presents with   Acute Visit     Skin tear    HPI:  Pt is a 89 y.o. female seen today for acute visit due to skin tear.   She currently resides on the skilled nursing unit at [facility]. PMH: hypertension, hyperlipidemia, macular degeneration, mild cognitive impairment, recurrent UTI, GERD, H/o TIA.   05/13 she was transferring from wheelchair and developed quarter sized skin tear to left calf. She also has open wound to right shin. She denies pain. Wound nurse has applied foam dressing to right wound and nonadherent dressing to left. Both legs are mildly swollen with weeping/exudate on bed linens. Exudate appears yellow and blood tinged. No h/o CHF. She denies cough, shortness of breath or weight gain. Afebrile. Vitals stable.    Past Medical History:  Diagnosis Date   Cervicalgia    Chest pain at rest 01/07/2012   Displacement of cervical intervertebral disc without myelopathy    Diverticulosis of colon (without mention of hemorrhage)    Female stress incontinence    GERD (gastroesophageal reflux disease)    no peds occasional pepcid   Inguinal hernia without mention of obstruction or gangrene, unilateral or unspecified, (not specified as recurrent)    Internal hemorrhoids without mention of complication    Lumbago    Macular degeneration (senile) of retina, unspecified    Osteoarthrosis, unspecified whether generalized or localized, unspecified site    Other and unspecified hyperlipidemia    Pain in joint, hand    Palpitations    Reflux esophagitis    Scoliosis (and kyphoscoliosis), idiopathic    Senile osteoporosis    Skin disorder    Thyroid  disease    TIA (transient ischemic attack)    Unruptured popliteal cyst  06/24/2014   Right knee    Unspecified essential hypertension    Unspecified glaucoma(365.9)    Unspecified hypothyroidism    Unspecified vitamin D  deficiency    Past Surgical History:  Procedure Laterality Date   ABDOMINAL HYSTERECTOMY  1975   Dr Delaine Favorite   APPENDECTOMY  1988    cardiolite myocardial perfusion study     DOPPLER ECHOCARDIOGRAPHY     EYE SURGERY Bilateral 2009   cataract removed right eye, Dr Neil Balls   FOOT SURGERY  august 2013   Hewitt, MD   INGUINAL HERNIA REPAIR Bilateral    w/mesh   INGUINAL HERNIA REPAIR Left 08/03/2017   Procedure: LAPAROSCOPIC LEFT INGUINAL HERNIA REPAIR WITH MESH;  Surgeon: Aldean Hummingbird, MD;  Location: [facility] OR;  Service: General;  Laterality: Left;   INGUINAL HERNIA REPAIR Right 08/03/2017   Procedure: HERNIA REPAIR RIGHT INGUINAL ADULT WITH MESH;  Surgeon: Aldean Hummingbird, MD;  Location: [facility] OR;  Service: General;  Laterality: Right;   INGUINAL HERNIA REPAIR     Dr. Elvan Hamel 04-24-18   INGUINAL HERNIA REPAIR Right 04/24/2018   Procedure: DIAGNOSTIC LAPAROSCOPIC, OPENREPAIR OF RECURRENT RIGHT INGUINAL HERNIA WITH MESH ERAS PATHWAY;  Surgeon: Aldean Hummingbird, MD;  Location: [facility] ORS;  Service: General;  Laterality: Right;   INSERTION OF MESH Bilateral 08/03/2017   Procedure: INSERTION OF MESH;  Surgeon: Aldean Hummingbird, MD;  Location: [facility] OR;  Service: General;  Laterality: Bilateral;   NM MYOVIEW  LTD     negative   TONSILLECTOMY  1937   TOOTH EXTRACTION  09/16/13   Dr Isabell Manzanilla    Allergies  Allergen Reactions   Trimethoprim Other (See Comments)    Headaches/ ear pressure    Erythromycin Other (See Comments)    UNSPECIFIED REACTION    Macrodantin [Nitrofurantoin Macrocrystal] Other (See Comments)    UNSPECIFIED REACTION    Other     Honeydew Melon   Penicillins Other (See Comments)    UNSPECIFIED REACTION  Has patient had a PCN reaction causing immediate rash, facial/tongue/throat swelling, SOB or lightheadedness with hypotension: Unknown Has patient had a PCN reaction causing severe rash involving mucus membranes or skin necrosis: Unknown Has patient had a PCN reaction that required hospitalization: No Has patient had a PCN reaction occurring within the last 10 years: No If all of the above answers are "NO", then may proceed with  Cephalosporin use.   Sulfa Antibiotics Other (See Comments)    UNSPECIFIED REACTION    Latex Itching    Outpatient Encounter Medications as of 09/26/2023  Medication Sig   acetaminophen  (TYLENOL ) 500 MG tablet Take 500 mg by mouth 2 (two) times daily as needed for mild pain or headache.    albuterol  (VENTOLIN  HFA) 108 (90 Base) MCG/ACT inhaler Inhale 2 puffs into the lungs every 6 (six) hours as needed for wheezing or shortness of breath.   cephALEXin  (KEFLEX ) 250 MG capsule Take 250 mg by mouth daily.   Cholecalciferol  (VITAMIN D3) 50 MCG (2000 UT) TABS Take by mouth.   clopidogrel  (PLAVIX ) 75 MG tablet TAKE ONE TABLET BY MOUTH DAILY   Cranberry-Vitamin C-Probiotic (AZO CRANBERRY PO) Take 1 tablet by mouth daily.   Menthol, Topical Analgesic, (BIOFREEZE ROLL-ON) 4 % GEL Apply 1 Application topically in the morning and at bedtime. Apply to right upper arm   Menthol, Topical Analgesic, (BIOFREEZE ROLL-ON) 4 % GEL Apply 1 application  topically 2 (two) times daily. Apply to right knee   metoprolol  succinate (TOPROL -XL) 25 MG 24 hr tablet Take one  tablet by mouth once daily to regulate heart and control blood pressure.   Multiple Vitamins-Minerals (PRESERVISION AREDS 2 PO) Take 1 capsule by mouth 2 (two) times daily.    vitamin C (ASCORBIC ACID) 500 MG tablet Take 500 mg by mouth daily with supper.    Zinc Oxide 10 % OINT Apply 1 Application topically as needed.   No facility-administered encounter medications on file as of 09/26/2023.    Review of Systems  Constitutional:  Negative for fatigue and fever.  Respiratory:  Negative for cough and shortness of breath.   Cardiovascular:  Positive for leg swelling. Negative for chest pain.  Musculoskeletal:  Positive for arthralgias and gait problem.  Skin:  Positive for wound.  Neurological:  Positive for weakness.  Psychiatric/Behavioral:  Negative for dysphoric mood. The patient is not nervous/anxious.     Immunization History  Administered  Date(s) Administered   Fluad Quad(high Dose 65+) 02/04/2019, 03/01/2022   Influenza Whole 02/24/2011, 02/12/2012   Influenza, High Dose Seasonal PF 02/01/2017, 02/25/2018, 02/11/2020, 03/13/2023   Influenza,inj,Quad PF,6+ Mos 02/20/2013, 02/10/2014, 02/19/2015, 02/18/2016   Influenza-Unspecified 02/16/2021   Moderna Covid-19 Fall Seasonal Vaccine 72yrs & older 02/09/2022, 03/21/2023   Moderna Sars-Covid-2 Vaccination 05/19/2019, 06/16/2019, 03/29/2020, 09/27/2020   Pneumococcal Conjugate-13 06/24/2014   Pneumococcal Polysaccharide-23 04/12/1998   Td 02/21/1996, 07/23/2003   Tdap 03/20/2010   Zoster Recombinant(Shingrix) 09/19/2016, 12/02/2016   Zoster, Live 09/15/2005   Pertinent  Health Maintenance Due  Topic Date Due   INFLUENZA VACCINE  12/14/2023   DEXA SCAN  Completed      09/06/2022    2:27 PM 12/27/2022    2:15 PM 04/18/2023    4:49 PM 08/27/2023    2:51 PM 09/14/2023   12:18 PM  Fall Risk  Falls in the past year? 1 0 0 1 1  Was there an injury with Fall? 1 0 0 0 0  Fall Risk Category Calculator 2 0 0 1 1  Patient at Risk for Falls Due to History of fall(s);Impaired balance/gait;Impaired mobility No Fall Risks History of fall(s);Impaired balance/gait;Impaired mobility History of fall(s);Impaired mobility History of fall(s);Impaired balance/gait  Fall risk Follow up Falls evaluation completed;Education provided;Falls prevention discussed Falls evaluation completed;Education provided;Falls prevention discussed Falls evaluation completed;Education provided;Falls prevention discussed Falls evaluation completed;Education provided Falls evaluation completed;Education provided   Functional Status Survey:    Vitals:   09/26/23 0923 09/26/23 0924  BP: (!) 164/96 (!) 156/83  Pulse: 82   Resp: 18   Temp: 98.2 F (36.8 C)   SpO2: 95%   Weight: 173 lb 9.6 oz (78.7 kg)   Height: 5\' 2"  (1.575 m)    Body mass index is 31.75 kg/m. Physical Exam Vitals reviewed.  Constitutional:       General: She is not in acute distress. HENT:     Head: Normocephalic.  Eyes:     General:        Right eye: No discharge.        Left eye: No discharge.  Cardiovascular:     Rate and Rhythm: Normal rate and regular rhythm.     Pulses: Normal pulses.     Heart sounds: Normal heart sounds.  Pulmonary:     Effort: Pulmonary effort is normal. No respiratory distress.     Breath sounds: Normal breath sounds. No wheezing or rales.  Abdominal:     Palpations: Abdomen is soft.  Musculoskeletal:     Cervical back: Neck supple.     Right lower leg: Edema present.  Left lower leg: Edema present.     Comments: Non pitting, no weeping present  Skin:    General: Skin is warm.     Capillary Refill: Capillary refill takes less than 2 seconds.     Comments: Approx 1 cm skin tear to right shin, no sign of infection. Approx 1-2 cm skin tear to left calf, no sign of infection, yellow/blood tinged exudate present.   Neurological:     General: No focal deficit present.     Mental Status: She is alert and oriented to person, place, and time.     Motor: Weakness present.     Gait: Gait abnormal.  Psychiatric:        Mood and Affect: Mood normal.     Labs reviewed: Recent Labs    10/31/22 0000 03/05/23 0000  NA 141 140  K 3.7 4.2  CL 104 105  CO2 24* 27*  BUN 29* 24*  CREATININE 0.8 0.6  CALCIUM  9.5 8.8   Recent Labs    10/31/22 0000 03/05/23 0000  AST 21 14  ALT 18 16  ALKPHOS 71 71  ALBUMIN 4.1 3.4*   Recent Labs    10/31/22 0000 03/05/23 0000  WBC 10.3 7.4  NEUTROABS  --  4,151.00  HGB 13.8 12.2  HCT 42 37  PLT 234 219   Lab Results  Component Value Date   TSH 3.20 10/31/2022   Lab Results  Component Value Date   HGBA1C 5.3 05/02/2013   Lab Results  Component Value Date   CHOL 188 07/10/2019   HDL 75 07/10/2019   LDLCALC 94 07/10/2019   TRIG 96 07/10/2019   CHOLHDL 2.5 07/10/2019    Significant Diagnostic Results in last 30 days:  No results  found.  Assessment/Plan 1. Lower leg edema (Primary) - non pitting edema on exam - suspect from sedentary lifestyle - discussed leg elevation - consider compression stockings if swelling worsens  2. Skin tear of left lower leg without complication, initial encounter - DOI 05/13 - occurred during transfer - no infection - some yellow, blood tinged exudate - cont nonadherent dressing changes  3. Skin tear of right lower leg without complication, initial encounter - see above    Family/ staff Communication: plan discussed with patient and nurse  Labs/tests ordered:  none

## 2023-09-27 DIAGNOSIS — R278 Other lack of coordination: Secondary | ICD-10-CM | POA: Diagnosis not present

## 2023-09-27 DIAGNOSIS — R262 Difficulty in walking, not elsewhere classified: Secondary | ICD-10-CM | POA: Diagnosis not present

## 2023-09-27 DIAGNOSIS — M6281 Muscle weakness (generalized): Secondary | ICD-10-CM | POA: Diagnosis not present

## 2023-09-28 ENCOUNTER — Encounter: Payer: Self-pay | Admitting: Orthopedic Surgery

## 2023-09-28 ENCOUNTER — Non-Acute Institutional Stay (SKILLED_NURSING_FACILITY): Payer: Self-pay | Admitting: Orthopedic Surgery

## 2023-09-28 DIAGNOSIS — M6281 Muscle weakness (generalized): Secondary | ICD-10-CM | POA: Diagnosis not present

## 2023-09-28 DIAGNOSIS — S81811D Laceration without foreign body, right lower leg, subsequent encounter: Secondary | ICD-10-CM | POA: Diagnosis not present

## 2023-09-28 DIAGNOSIS — R600 Localized edema: Secondary | ICD-10-CM | POA: Diagnosis not present

## 2023-09-28 DIAGNOSIS — R278 Other lack of coordination: Secondary | ICD-10-CM | POA: Diagnosis not present

## 2023-09-28 DIAGNOSIS — I10 Essential (primary) hypertension: Secondary | ICD-10-CM

## 2023-09-28 DIAGNOSIS — R262 Difficulty in walking, not elsewhere classified: Secondary | ICD-10-CM | POA: Diagnosis not present

## 2023-09-28 MED ORDER — POTASSIUM CHLORIDE CRYS ER 10 MEQ PO TBCR: 10.0000 meq | EXTENDED_RELEASE_TABLET | Freq: Two times a day (BID) | ORAL | Status: DC

## 2023-09-28 MED ORDER — FUROSEMIDE 20 MG PO TABS: 20.0000 mg | ORAL_TABLET | Freq: Every day | ORAL | Status: DC

## 2023-09-28 NOTE — Progress Notes (Signed)
 Location:  [facility] Room Number: 14/A [name]:  SNF [phone]) Provider:  Arnetha Bhat, NP   Marguerite Shiley, MD  Patient Care Team: Marguerite Shiley, MD as PCP - General (Internal Medicine) Avanell Leigh, MD as PCP - Cardiology (Cardiology) Jolinda Necessary, MD (Inactive) as Consulting Physician (Gastroenterology) Orvan Blanch, MD as Attending Physician (Urology) Avanell Leigh, MD as Consulting Physician (Cardiology) Prescott Brodie, MD (Inactive) as Consulting Physician (Otolaryngology) Bartholome Boron, DDS (Dentistry) Oris Birmingham, MD as Consulting Physician (Ophthalmology) Arvil Birks, MD as Consulting Physician (Orthopedic Surgery) Aldean Hummingbird, MD as Consulting Physician (General Surgery)  Extended Emergency Contact Information Primary Emergency Contact: [facility] Address: [address]          [place], [place] [address] [place] Home Phone: [phone] Mobile Phone: [phone] Relation: Son Secondary Emergency Contact: Jerene Monks Address: [address]          [place], [place] [address] [place] Home Phone: [phone] Work Phone: [phone] Mobile Phone: [phone] Relation: Daughter  Code Status:  DNR Goals of care: Advanced Directive information    09/26/2023    9:26 AM  Advanced Directives  Does Patient Have a Medical Advance Directive? Yes  Type of Estate agent of [place];Out of facility DNR (pink MOST or yellow form);Living will  Does patient want to make changes to medical advance directive? No - Patient declined  Copy of Healthcare Power of Attorney in Chart? Yes - validated most recent copy scanned in chart (See row information)  Pre-existing out of facility DNR order (yellow form or pink MOST form) Pink MOST/Yellow Form most recent copy in chart - Physician notified to receive inpatient order     Chief Complaint  Patient presents with    Acute Visit    Lower leg swelling, elevated blood pressure    HPI:  Pt is a 89 y.o. female seen today for acute visit due to lower leg swelling and elevated blood pressure.   She currently resides on the skilled nursing unit at [facility]. PMH: hypertension, hyperlipidemia, macular degeneration, mild cognitive impairment, recurrent UTI, GERD, H/o TIA.   "05/13 she was transferring from wheelchair and developed quarter sized skin tear to left calf. She also has open wound to right shin. She denies pain. Wound nurse has applied foam dressing to right wound and nonadherent dressing to left. Both legs are mildly swollen with weeping/exudate on bed linens. Exudate appears yellow and blood tinged. No h/o CHF. She denies cough, shortness of breath or weight gain."  Today, RLE ankle is swollen and weeping. Nursing is unable to apply compression stockings. She denies pain or injury to RLE. Denies chest pain, shortness of breath or acute cough.   Blood pressure 164/96> given metoprolol > rechecked bp 154/83. BUN/creat 24/0.57 08/16/2023. See trends below.   Recent blood pressures:  05/13- 164/96  05/06- 156/83  04/29- 136/86  04/22- 142/74  Past Medical History:  Diagnosis Date   Cervicalgia    Chest pain at rest 01/07/2012   Displacement of cervical intervertebral disc without myelopathy    Diverticulosis of colon (without mention of hemorrhage)    Female stress incontinence    GERD (gastroesophageal reflux disease)    no peds occasional pepcid   Inguinal hernia without mention of obstruction or gangrene, unilateral or unspecified, (not specified as recurrent)    Internal hemorrhoids without mention of complication    Lumbago    Macular  degeneration (senile) of retina, unspecified    Osteoarthrosis, unspecified whether generalized or localized, unspecified site    Other and unspecified hyperlipidemia    Pain in joint, hand    Palpitations    Reflux esophagitis    Scoliosis (and  kyphoscoliosis), idiopathic    Senile osteoporosis    Skin disorder    Thyroid  disease    TIA (transient ischemic attack)    Unruptured popliteal cyst 06/24/2014   Right knee    Unspecified essential hypertension    Unspecified glaucoma(365.9)    Unspecified hypothyroidism    Unspecified vitamin D  deficiency    Past Surgical History:  Procedure Laterality Date   ABDOMINAL HYSTERECTOMY  1975   Dr Delaine Favorite   APPENDECTOMY  1988   cardiolite myocardial perfusion study     DOPPLER ECHOCARDIOGRAPHY     EYE SURGERY Bilateral 2009   cataract removed right eye, Dr Neil Balls   FOOT SURGERY  august 2013   Hewitt, MD   INGUINAL HERNIA REPAIR Bilateral    w/mesh   INGUINAL HERNIA REPAIR Left 08/03/2017   Procedure: LAPAROSCOPIC LEFT INGUINAL HERNIA REPAIR WITH MESH;  Surgeon: Aldean Hummingbird, MD;  Location: [facility] OR;  Service: General;  Laterality: Left;   INGUINAL HERNIA REPAIR Right 08/03/2017   Procedure: HERNIA REPAIR RIGHT INGUINAL ADULT WITH MESH;  Surgeon: Aldean Hummingbird, MD;  Location[facility];  Service: General;  Laterality: Right;   INGUINAL HERNIA REPAIR     Dr. Elvan Hamel 04-24-18   INGUINAL HERNIA REPAIR Right 04/24/2018   Procedure: DIAGNOSTIC LAPAROSCOPIC, OPENREPAIR OF RECURRENT RIGHT INGUINAL HERNIA WITH MESH ERAS PATHWAY;  Surgeon: Aldean Hummingbird, MD;  Location: [facility] ORS;  Service: General;  Laterality: Right;   INSERTION OF MESH Bilateral 08/03/2017   Procedure: INSERTION OF MESH;  Surgeon: Aldean Hummingbird, MD;  Location: [facility];  Service: General;  Laterality: Bilateral;   NM MYOVIEW  LTD     negative   TONSILLECTOMY  1937   TOOTH EXTRACTION  09/16/13   Dr Isabell Manzanilla    Allergies  Allergen Reactions   Trimethoprim Other (See Comments)    Headaches/ ear pressure    Erythromycin Other (See Comments)    UNSPECIFIED REACTION    Macrodantin [Nitrofurantoin Macrocrystal] Other (See Comments)    UNSPECIFIED REACTION    Other     Honeydew Melon   Penicillins Other (See Comments)    UNSPECIFIED REACTION   Has patient had a PCN reaction causing immediate rash, facial/tongue/throat swelling, SOB or lightheadedness with hypotension: Unknown Has patient had a PCN reaction causing severe rash involving mucus membranes or skin necrosis: Unknown Has patient had a PCN reaction that required hospitalization: No Has patient had a PCN reaction occurring within the last 10 years: No If all of the above answers are "NO", then may proceed with Cephalosporin use.   Sulfa Antibiotics Other (See Comments)    UNSPECIFIED REACTION    Latex Itching    Outpatient Encounter Medications as of 09/28/2023  Medication Sig   acetaminophen  (TYLENOL ) 500 MG tablet Take 500 mg by mouth 2 (two) times daily as needed for mild pain or headache.    albuterol  (VENTOLIN  HFA) 108 (90 Base) MCG/ACT inhaler Inhale 2 puffs into the lungs every 6 (six) hours as needed for wheezing or shortness of breath.   cephALEXin  (KEFLEX ) 250 MG capsule Take 250 mg by mouth daily.   Cholecalciferol  (VITAMIN D3) 50 MCG (2000 UT) TABS Take by mouth.   clopidogrel  (PLAVIX ) 75 MG tablet TAKE ONE TABLET BY MOUTH  DAILY   Cranberry-Vitamin C-Probiotic (AZO CRANBERRY PO) Take 1 tablet by mouth daily.   Menthol, Topical Analgesic, (BIOFREEZE ROLL-ON) 4 % GEL Apply 1 Application topically in the morning and at bedtime. Apply to right upper arm   Menthol, Topical Analgesic, (BIOFREEZE ROLL-ON) 4 % GEL Apply 1 application  topically 2 (two) times daily. Apply to right knee   metoprolol  succinate (TOPROL -XL) 25 MG 24 hr tablet Take one tablet by mouth once daily to regulate heart and control blood pressure.   Multiple Vitamins-Minerals (PRESERVISION AREDS 2 PO) Take 1 capsule by mouth 2 (two) times daily.    vitamin C (ASCORBIC ACID) 500 MG tablet Take 500 mg by mouth daily with supper.    Zinc Oxide 10 % OINT Apply 1 Application topically as needed.   No facility-administered encounter medications on file as of 09/28/2023.    Review of Systems   Constitutional:  Negative for fatigue and fever.  HENT:  Negative for sore throat and trouble swallowing.   Respiratory:  Negative for cough and shortness of breath.   Cardiovascular:  Positive for leg swelling. Negative for chest pain.  Gastrointestinal:  Negative for abdominal distention and abdominal pain.  Genitourinary:  Negative for dysuria and hematuria.  Musculoskeletal:  Positive for gait problem and joint swelling.  Skin:  Positive for wound.  Neurological:  Positive for weakness. Negative for dizziness and headaches.  Psychiatric/Behavioral:  Negative for dysphoric mood. The patient is not nervous/anxious.     Immunization History  Administered Date(s) Administered   Fluad Quad(high Dose 65+) 02/04/2019, 03/01/2022   Influenza Whole 02/24/2011, 02/12/2012   Influenza, High Dose Seasonal PF 02/01/2017, 02/25/2018, 02/11/2020, 03/13/2023   Influenza,inj,Quad PF,6+ Mos 02/20/2013, 02/10/2014, 02/19/2015, 02/18/2016   Influenza-Unspecified 02/16/2021   Moderna Covid-19 Fall Seasonal Vaccine 40yrs & older 02/09/2022, 03/21/2023   Moderna Sars-Covid-2 Vaccination 05/19/2019, 06/16/2019, 03/29/2020, 09/27/2020   Pneumococcal Conjugate-13 06/24/2014   Pneumococcal Polysaccharide-23 04/12/1998   Td 02/21/1996, 07/23/2003   Tdap 03/20/2010   Zoster Recombinant(Shingrix) 09/19/2016, 12/02/2016   Zoster, Live 09/15/2005   Pertinent  Health Maintenance Due  Topic Date Due   INFLUENZA VACCINE  12/14/2023   DEXA SCAN  Completed      09/06/2022    2:27 PM 12/27/2022    2:15 PM 04/18/2023    4:49 PM 08/27/2023    2:51 PM 09/14/2023   12:18 PM  Fall Risk  Falls in the past year? 1 0 0 1 1  Was there an injury with Fall? 1 0 0 0 0  Fall Risk Category Calculator 2 0 0 1 1  Patient at Risk for Falls Due to History of fall(s);Impaired balance/gait;Impaired mobility No Fall Risks History of fall(s);Impaired balance/gait;Impaired mobility History of fall(s);Impaired mobility History of  fall(s);Impaired balance/gait  Fall risk Follow up Falls evaluation completed;Education provided;Falls prevention discussed Falls evaluation completed;Education provided;Falls prevention discussed Falls evaluation completed;Education provided;Falls prevention discussed Falls evaluation completed;Education provided Falls evaluation completed;Education provided   Functional Status Survey:    Vitals:   09/28/23 1019  BP: (!) 164/96  Pulse: 82  Resp: 18  Temp: 98.2 F (36.8 C)  SpO2: 95%  Weight: 173 lb 9.6 oz (78.7 kg)  Height: 5\' 2"  (1.575 m)   Body mass index is 31.75 kg/m. Physical Exam Vitals reviewed.  Constitutional:      General: She is not in acute distress. HENT:     Head: Normocephalic.  Eyes:     General:        Right eye: No  discharge.        Left eye: No discharge.  Cardiovascular:     Rate and Rhythm: Normal rate and regular rhythm.     Pulses: Normal pulses.     Heart sounds: Normal heart sounds.  Pulmonary:     Effort: Pulmonary effort is normal. No respiratory distress.     Breath sounds: Normal breath sounds. No wheezing or rales.  Abdominal:     Palpations: Abdomen is soft.  Musculoskeletal:     Cervical back: Neck supple.     Right lower leg: Edema present.     Left lower leg: Edema present.     Comments: RLE with 2+ pitting edema, mild weeping noted on socks  Skin:    Capillary Refill: Capillary refill takes less than 2 seconds.     Comments: RLE skin tear to no sign of infection  Neurological:     General: No focal deficit present.     Mental Status: She is alert.     Motor: Weakness present.     Gait: Gait abnormal.  Psychiatric:        Mood and Affect: Mood normal.     Labs reviewed: Recent Labs    10/31/22 0000 03/05/23 0000  NA 141 140  K 3.7 4.2  CL 104 105  CO2 24* 27*  BUN 29* 24*  CREATININE 0.8 0.6  CALCIUM  9.5 8.8   Recent Labs    10/31/22 0000 03/05/23 0000  AST 21 14  ALT 18 16  ALKPHOS 71 71  ALBUMIN 4.1 3.4*    Recent Labs    10/31/22 0000 03/05/23 0000  WBC 10.3 7.4  NEUTROABS  --  4,151.00  HGB 13.8 12.2  HCT 42 37  PLT 234 219   Lab Results  Component Value Date   TSH 3.20 10/31/2022   Lab Results  Component Value Date   HGBA1C 5.3 05/02/2013   Lab Results  Component Value Date   CHOL 188 07/10/2019   HDL 75 07/10/2019   LDLCALC 94 07/10/2019   TRIG 96 07/10/2019   CHOLHDL 2.5 07/10/2019    Significant Diagnostic Results in last 30 days:  No results found.  Assessment/Plan Bilateral leg edema - ongoing, R>L - right leg with some weeping - suspect from sedentary lifestyle - start furosemide  20 mg/ KCL 10 meq po QAM x 2 days - start medical compression stockings 05/19 - cont leg elevation - if no improvement BMP/BNP  2. Skin tear of right lower leg without complication, subsequent encounter - DOI 05/13 - no sign of infection - cont nonadherent dressing changes  3. Essential hypertension - mildly elevated, most readings at goal < 150/90 - cont metoprolol     Family/ staff Communication: plan discussed with patient and nurse  Labs/tests ordered:  none

## 2023-10-01 DIAGNOSIS — R262 Difficulty in walking, not elsewhere classified: Secondary | ICD-10-CM | POA: Diagnosis not present

## 2023-10-01 DIAGNOSIS — M6281 Muscle weakness (generalized): Secondary | ICD-10-CM | POA: Diagnosis not present

## 2023-10-01 DIAGNOSIS — R278 Other lack of coordination: Secondary | ICD-10-CM | POA: Diagnosis not present

## 2023-10-02 DIAGNOSIS — R262 Difficulty in walking, not elsewhere classified: Secondary | ICD-10-CM | POA: Diagnosis not present

## 2023-10-02 DIAGNOSIS — R278 Other lack of coordination: Secondary | ICD-10-CM | POA: Diagnosis not present

## 2023-10-02 DIAGNOSIS — M6281 Muscle weakness (generalized): Secondary | ICD-10-CM | POA: Diagnosis not present

## 2023-10-03 DIAGNOSIS — R278 Other lack of coordination: Secondary | ICD-10-CM | POA: Diagnosis not present

## 2023-10-03 DIAGNOSIS — M6281 Muscle weakness (generalized): Secondary | ICD-10-CM | POA: Diagnosis not present

## 2023-10-03 DIAGNOSIS — R262 Difficulty in walking, not elsewhere classified: Secondary | ICD-10-CM | POA: Diagnosis not present

## 2023-10-05 DIAGNOSIS — R278 Other lack of coordination: Secondary | ICD-10-CM | POA: Diagnosis not present

## 2023-10-05 DIAGNOSIS — M6281 Muscle weakness (generalized): Secondary | ICD-10-CM | POA: Diagnosis not present

## 2023-10-05 DIAGNOSIS — R262 Difficulty in walking, not elsewhere classified: Secondary | ICD-10-CM | POA: Diagnosis not present

## 2023-10-08 DIAGNOSIS — M6281 Muscle weakness (generalized): Secondary | ICD-10-CM | POA: Diagnosis not present

## 2023-10-08 DIAGNOSIS — R278 Other lack of coordination: Secondary | ICD-10-CM | POA: Diagnosis not present

## 2023-10-08 DIAGNOSIS — R262 Difficulty in walking, not elsewhere classified: Secondary | ICD-10-CM | POA: Diagnosis not present

## 2023-10-10 DIAGNOSIS — R262 Difficulty in walking, not elsewhere classified: Secondary | ICD-10-CM | POA: Diagnosis not present

## 2023-10-10 DIAGNOSIS — M6281 Muscle weakness (generalized): Secondary | ICD-10-CM | POA: Diagnosis not present

## 2023-10-10 DIAGNOSIS — R278 Other lack of coordination: Secondary | ICD-10-CM | POA: Diagnosis not present

## 2023-10-11 DIAGNOSIS — R262 Difficulty in walking, not elsewhere classified: Secondary | ICD-10-CM | POA: Diagnosis not present

## 2023-10-11 DIAGNOSIS — R278 Other lack of coordination: Secondary | ICD-10-CM | POA: Diagnosis not present

## 2023-10-11 DIAGNOSIS — M6281 Muscle weakness (generalized): Secondary | ICD-10-CM | POA: Diagnosis not present

## 2023-10-12 ENCOUNTER — Non-Acute Institutional Stay (SKILLED_NURSING_FACILITY): Payer: Self-pay | Admitting: Nurse Practitioner

## 2023-10-12 ENCOUNTER — Encounter: Payer: Self-pay | Admitting: Nurse Practitioner

## 2023-10-12 DIAGNOSIS — R262 Difficulty in walking, not elsewhere classified: Secondary | ICD-10-CM | POA: Diagnosis not present

## 2023-10-12 DIAGNOSIS — I872 Venous insufficiency (chronic) (peripheral): Secondary | ICD-10-CM

## 2023-10-12 DIAGNOSIS — R059 Cough, unspecified: Secondary | ICD-10-CM | POA: Insufficient documentation

## 2023-10-12 DIAGNOSIS — M6281 Muscle weakness (generalized): Secondary | ICD-10-CM | POA: Diagnosis not present

## 2023-10-12 DIAGNOSIS — I10 Essential (primary) hypertension: Secondary | ICD-10-CM | POA: Diagnosis not present

## 2023-10-12 DIAGNOSIS — R051 Acute cough: Secondary | ICD-10-CM | POA: Diagnosis not present

## 2023-10-12 DIAGNOSIS — N3020 Other chronic cystitis without hematuria: Secondary | ICD-10-CM | POA: Diagnosis not present

## 2023-10-12 DIAGNOSIS — R278 Other lack of coordination: Secondary | ICD-10-CM | POA: Diagnosis not present

## 2023-10-12 NOTE — Assessment & Plan Note (Addendum)
 wheezing cough, increased edema, afebrile, no O2 desaturation. Denied sore throat, chest pain, generalized malaise, or decreased appetite.  Furosemide  20mg /Kcl 10meq po every day+ DuoNeb q8hr x 3 days in setting of moist rales posterior lower lung fields.  Update CXR ap/lateral, CBC/diff, CMP/eGFR, BNP

## 2023-10-12 NOTE — Assessment & Plan Note (Addendum)
 Intermittent mildly elevated Sbp, on Metoprolol , Bun/creat 24/0.55 03/05/23, adding Furosemide  should help.

## 2023-10-12 NOTE — Progress Notes (Signed)
 Location:   [facility] Room Number: 14 [name]:  SNF (31) Provider: Abner Hoffman Helayna Dun NP  Marguerite Shiley, MD  Patient Care Team: Marguerite Shiley, MD as PCP - General (Internal Medicine) Avanell Leigh, MD as PCP - Cardiology (Cardiology) Jolinda Necessary, MD (Inactive) as Consulting Physician (Gastroenterology) Orvan Blanch, MD as Attending Physician (Urology) Avanell Leigh, MD as Consulting Physician (Cardiology) Prescott Brodie, MD (Inactive) as Consulting Physician (Otolaryngology) Bartholome Boron, DDS (Dentistry) Oris Birmingham, MD as Consulting Physician (Ophthalmology) Arvil Birks, MD as Consulting Physician (Orthopedic Surgery) Aldean Hummingbird, MD as Consulting Physician (General Surgery)  Extended Emergency Contact Information Primary Emergency Contact: [facility] Address: [address] [place] [address] [place] Home Phone: [phone] Mobile Phone: [phone] Relation: Son Secondary Emergency Contact: Jerene Monks Address: [address]          [place], [place] [address] [place] Home Phone: [phone] Work Phone: [phone] Mobile Phone: [phone] Relation: Daughter  Code Status: DNR Goals of care: Advanced Directive information    09/26/2023    9:26 AM  Advanced Directives  Does Patient Have a Medical Advance Directive? Yes  Type of Estate agent of [place];Out of facility DNR (pink MOST or yellow form);Living will  Does patient want to make changes to medical advance directive? No - Patient declined  Copy of Healthcare Power of Attorney in Chart? Yes - validated most recent copy scanned in chart (See row information)  Pre-existing out of facility DNR order (yellow form or pink MOST form) Pink MOST/Yellow Form most recent copy in chart - Physician notified to receive inpatient order     Chief Complaint  Patient presents with   Acute Visit     Wheezing cough    HPI:  Pt is a 89 y.o. female seen today for an acute visit for wheezing cough, increased edema, afebrile, no O2 desaturation. Denied sore throat, chest pain, generalized malaise, or decreased appetite.   HTN, on Metoprolol , Bun/creat 24/0.55 03/05/23  Chronic venous insufficiency/Edema BLE, last Furosemide  20mg  every day 5/16-5/17  Chronic cystitis, on Keflex  250mg  qd      Past Medical History:  Diagnosis Date   Cervicalgia    Chest pain at rest 01/07/2012   Displacement of cervical intervertebral disc without myelopathy    Diverticulosis of colon (without mention of hemorrhage)    Female stress incontinence    GERD (gastroesophageal reflux disease)    no peds occasional pepcid   Inguinal hernia without mention of obstruction or gangrene, unilateral or unspecified, (not specified as recurrent)    Internal hemorrhoids without mention of complication    Lumbago    Macular degeneration (senile) of retina, unspecified    Osteoarthrosis, unspecified whether generalized or localized, unspecified site    Other and unspecified hyperlipidemia    Pain in joint, hand    Palpitations    Reflux esophagitis    Scoliosis (and kyphoscoliosis), idiopathic    Senile osteoporosis    Skin disorder    Thyroid  disease    TIA (transient ischemic attack)    Unruptured popliteal cyst 06/24/2014   Right knee    Unspecified essential hypertension    Unspecified glaucoma(365.9)    Unspecified hypothyroidism    Unspecified vitamin D  deficiency    Past Surgical History:  Procedure Laterality Date   ABDOMINAL HYSTERECTOMY  1975   Dr Delaine Favorite   APPENDECTOMY  1988  cardiolite myocardial perfusion study     DOPPLER ECHOCARDIOGRAPHY     EYE SURGERY Bilateral 2009   cataract removed right eye, Dr Neil Balls   FOOT SURGERY  august 2013   Hewitt, MD   INGUINAL HERNIA REPAIR Bilateral    w/mesh   INGUINAL HERNIA REPAIR Left 08/03/2017   Procedure: LAPAROSCOPIC LEFT INGUINAL HERNIA REPAIR  WITH MESH;  Surgeon: Aldean Hummingbird, MD;  Location: [facility] OR;  Service: General;  Laterality: Left;   INGUINAL HERNIA REPAIR Right 08/03/2017   Procedure: HERNIA REPAIR RIGHT INGUINAL ADULT WITH MESH;  Surgeon: Aldean Hummingbird, MD;  Location: [facility] OR;  Service: General;  Laterality: Right;   INGUINAL HERNIA REPAIR     Dr. Elvan Hamel 04-24-18   INGUINAL HERNIA REPAIR Right 04/24/2018   Procedure: DIAGNOSTIC LAPAROSCOPIC, OPENREPAIR OF RECURRENT RIGHT INGUINAL HERNIA WITH MESH ERAS PATHWAY;  Surgeon: Aldean Hummingbird, MD;  Location: [facility] ORS;  Service: General;  Laterality: Right;   INSERTION OF MESH Bilateral 08/03/2017   Procedure: INSERTION OF MESH;  Surgeon: Aldean Hummingbird, MD;  Location[facility];  Service: General;  Laterality: Bilateral;   NM MYOVIEW  LTD     negative   TONSILLECTOMY  1937   TOOTH EXTRACTION  09/16/13   Dr Isabell Manzanilla    Allergies  Allergen Reactions   Trimethoprim Other (See Comments)    Headaches/ ear pressure    Erythromycin Other (See Comments)    UNSPECIFIED REACTION    Macrodantin [Nitrofurantoin Macrocrystal] Other (See Comments)    UNSPECIFIED REACTION    Other     Honeydew Melon   Penicillins Other (See Comments)    UNSPECIFIED REACTION  Has patient had a PCN reaction causing immediate rash, facial/tongue/throat swelling, SOB or lightheadedness with hypotension: Unknown Has patient had a PCN reaction causing severe rash involving mucus membranes or skin necrosis: Unknown Has patient had a PCN reaction that required hospitalization: No Has patient had a PCN reaction occurring within the last 10 years: No If all of the above answers are "NO", then may proceed with Cephalosporin use.   Sulfa Antibiotics Other (See Comments)    UNSPECIFIED REACTION    Latex Itching    Allergies as of 10/12/2023       Reactions   Trimethoprim Other (See Comments)   Headaches/ ear pressure    Erythromycin Other (See Comments)   UNSPECIFIED REACTION    Macrodantin [nitrofurantoin Macrocrystal]  Other (See Comments)   UNSPECIFIED REACTION    Other    Honeydew Melon   Penicillins Other (See Comments)   UNSPECIFIED REACTION  Has patient had a PCN reaction causing immediate rash, facial/tongue/throat swelling, SOB or lightheadedness with hypotension: Unknown Has patient had a PCN reaction causing severe rash involving mucus membranes or skin necrosis: Unknown Has patient had a PCN reaction that required hospitalization: No Has patient had a PCN reaction occurring within the last 10 years: No If all of the above answers are "NO", then may proceed with Cephalosporin use.   Sulfa Antibiotics Other (See Comments)   UNSPECIFIED REACTION    Latex Itching        Medication List        Accurate as of Oct 12, 2023 11:59 PM. If you have any questions, ask your nurse or doctor.          acetaminophen  500 MG tablet Commonly known as: TYLENOL  Take 500 mg by mouth 2 (two) times daily as needed for mild pain or headache.   albuterol  108 (90 Base) MCG/ACT inhaler Commonly  known as: VENTOLIN  HFA Inhale 2 puffs into the lungs every 6 (six) hours as needed for wheezing or shortness of breath.   ascorbic acid 500 MG tablet Commonly known as: VITAMIN C Take 500 mg by mouth daily with supper.   AZO CRANBERRY PO Take 1 tablet by mouth daily.   Biofreeze Roll-On 4 % Gel Generic drug: Menthol (Topical Analgesic) Apply 1 Application topically in the morning and at bedtime. Apply to right upper arm   Biofreeze Roll-On 4 % Gel Generic drug: Menthol (Topical Analgesic) Apply 1 application  topically 2 (two) times daily. Apply to right knee   cephALEXin  250 MG capsule Commonly known as: KEFLEX  Take 250 mg by mouth daily.   clopidogrel  75 MG tablet Commonly known as: PLAVIX  TAKE ONE TABLET BY MOUTH DAILY   furosemide  20 MG tablet Commonly known as: LASIX  Take 1 tablet (20 mg total) by mouth daily for 2 days.   metoprolol  succinate 25 MG 24 hr tablet Commonly known as:  TOPROL -XL Take one tablet by mouth once daily to regulate heart and control blood pressure.   potassium chloride  10 MEQ tablet Commonly known as: KLOR-CON  M Take 1 tablet (10 mEq total) by mouth 2 (two) times daily for 2 days.   PRESERVISION AREDS 2 PO Take 1 capsule by mouth 2 (two) times daily.   Vitamin D3 50 MCG (2000 UT) Tabs Take by mouth.   Zinc Oxide 10 % Oint Apply 1 Application topically as needed.        Review of Systems  Constitutional:  Negative for appetite change, fatigue and fever.  HENT:  Positive for hearing loss. Negative for congestion and voice change.   Eyes:  Positive for visual disturbance.  Respiratory:  Positive for cough and wheezing. Negative for chest tightness and shortness of breath.   Cardiovascular:  Positive for leg swelling. Negative for chest pain and palpitations.  Gastrointestinal:  Negative for abdominal pain and constipation.  Genitourinary:  Negative for difficulty urinating, dysuria and urgency.  Musculoskeletal:  Positive for arthralgias and gait problem.       Lower back, left hip/leg pain since 09/24/19, working with therapy, saw and f/u Ortho  Skin:  Negative for color change.  Neurological:  Negative for speech difficulty and headaches.  Psychiatric/Behavioral:  Negative for behavioral problems and sleep disturbance. The patient is not nervous/anxious.     Immunization History  Administered Date(s) Administered   Fluad Quad(high Dose 65+) 02/04/2019, 03/01/2022   Influenza Whole 02/24/2011, 02/12/2012   Influenza, High Dose Seasonal PF 02/01/2017, 02/25/2018, 02/11/2020, 03/13/2023   Influenza,inj,Quad PF,6+ Mos 02/20/2013, 02/10/2014, 02/19/2015, 02/18/2016   Influenza-Unspecified 02/16/2021   Moderna Covid-19 Fall Seasonal Vaccine 70yrs & older 02/09/2022, 03/21/2023   Moderna Sars-Covid-2 Vaccination 05/19/2019, 06/16/2019, 03/29/2020, 09/27/2020   Pneumococcal Conjugate-13 06/24/2014   Pneumococcal Polysaccharide-23  04/12/1998   Td 02/21/1996, 07/23/2003   Tdap 03/20/2010   Zoster Recombinant(Shingrix) 09/19/2016, 12/02/2016   Zoster, Live 09/15/2005   Pertinent  Health Maintenance Due  Topic Date Due   INFLUENZA VACCINE  12/14/2023   DEXA SCAN  Completed      09/06/2022    2:27 PM 12/27/2022    2:15 PM 04/18/2023    4:49 PM 08/27/2023    2:51 PM 09/14/2023   12:18 PM  Fall Risk  Falls in the past year? 1 0 0 1 1  Was there an injury with Fall? 1 0 0 0 0  Fall Risk Category Calculator 2 0 0 1 1  Patient at  Risk for Falls Due to History of fall(s);Impaired balance/gait;Impaired mobility No Fall Risks History of fall(s);Impaired balance/gait;Impaired mobility History of fall(s);Impaired mobility History of fall(s);Impaired balance/gait  Fall risk Follow up Falls evaluation completed;Education provided;Falls prevention discussed Falls evaluation completed;Education provided;Falls prevention discussed Falls evaluation completed;Education provided;Falls prevention discussed Falls evaluation completed;Education provided Falls evaluation completed;Education provided   Functional Status Survey:    Vitals:   10/12/23 1322 10/15/23 1449  BP: (!) 159/83 (!) 141/75  Pulse: 74   Resp: 19   Temp: 97.9 F (36.6 C)   SpO2: 97%   Weight: 173 lb 9.6 oz (78.7 kg)    Body mass index is 31.75 kg/m. Physical Exam Vitals and nursing note reviewed.  Constitutional:      Appearance: Normal appearance.  HENT:     Head: Normocephalic and atraumatic.     Nose: Nose normal.     Mouth/Throat:     Mouth: Mucous membranes are moist.  Eyes:     Extraocular Movements: Extraocular movements intact.     Conjunctiva/sclera: Conjunctivae normal.     Pupils: Pupils are equal, round, and reactive to light.  Cardiovascular:     Rate and Rhythm: Normal rate and regular rhythm.     Heart sounds: Murmur heard.     Comments: DP pulses presents R+L Pulmonary:     Effort: Pulmonary effort is normal.     Breath sounds:  Wheezing and rales present. No rhonchi.     Comments: Posterior lower lung fields moist rales, scattered scant expiratory wheezes.  Abdominal:     General: Bowel sounds are normal.     Palpations: Abdomen is soft.     Tenderness: There is no abdominal tenderness.     Hernia: A hernia is present.     Comments: Right inguinal hernia  Musculoskeletal:     Cervical back: Normal range of motion and neck supple.     Right lower leg: Edema present.     Left lower leg: Edema present.     Comments: 2+ edema BLE  Skin:    General: Skin is warm and dry.  Neurological:     General: No focal deficit present.     Mental Status: She is alert and oriented to person, place, and time. Mental status is at baseline.     Gait: Gait abnormal.     Comments: Uses cane  Psychiatric:        Mood and Affect: Mood normal.        Behavior: Behavior normal.        Thought Content: Thought content normal.        Judgment: Judgment normal.     Labs reviewed: Recent Labs    10/31/22 0000 03/05/23 0000  NA 141 140  K 3.7 4.2  CL 104 105  CO2 24* 27*  BUN 29* 24*  CREATININE 0.8 0.6  CALCIUM  9.5 8.8   Recent Labs    10/31/22 0000 03/05/23 0000  AST 21 14  ALT 18 16  ALKPHOS 71 71  ALBUMIN 4.1 3.4*   Recent Labs    10/31/22 0000 03/05/23 0000  WBC 10.3 7.4  NEUTROABS  --  4,151.00  HGB 13.8 12.2  HCT 42 37  PLT 234 219   Lab Results  Component Value Date   TSH 3.20 10/31/2022   Lab Results  Component Value Date   HGBA1C 5.3 05/02/2013   Lab Results  Component Value Date   CHOL 188 07/10/2019   HDL 75 07/10/2019  LDLCALC 94 07/10/2019   TRIG 96 07/10/2019   CHOLHDL 2.5 07/10/2019    Significant Diagnostic Results in last 30 days:  No results found.  Assessment/Plan: Cough wheezing cough, increased edema, afebrile, no O2 desaturation. Denied sore throat, chest pain, generalized malaise, or decreased appetite.  Furosemide  20mg /Kcl 10meq po every day+ DuoNeb q8hr x 3 days  in setting of moist rales posterior lower lung fields.  Update CXR ap/lateral, CBC/diff, CMP/eGFR, BNP  Essential hypertension Intermittent mildly elevated Sbp, on Metoprolol , Bun/creat 24/0.55 03/05/23, adding Furosemide  should help.   Chronic venous insufficiency Edema BLE, last Furosemide  20mg  every day 5/16-5/17 Furosemide  20mg /Kcl every day x3 days in setting of elevated Sbp  Chronic cystitis on Keflex  250mg  qd      Family/ staff Communication: plan of care reviewed with the patient and charge nurse.   Labs/tests ordered:  CXR ap/lateral, CBC/diff, CMP/eGFR, BNP

## 2023-10-12 NOTE — Assessment & Plan Note (Signed)
 on Keflex  250mg  qd

## 2023-10-12 NOTE — Assessment & Plan Note (Signed)
 Edema BLE, last Furosemide  20mg  every day 5/16-5/17 Furosemide  20mg /Kcl 10meq every day x3 days in setting of elevated Sbp

## 2023-10-13 DIAGNOSIS — R059 Cough, unspecified: Secondary | ICD-10-CM | POA: Diagnosis not present

## 2023-10-15 ENCOUNTER — Encounter: Payer: Self-pay | Admitting: Orthopedic Surgery

## 2023-10-15 ENCOUNTER — Non-Acute Institutional Stay (SKILLED_NURSING_FACILITY): Payer: Self-pay | Admitting: Orthopedic Surgery

## 2023-10-15 DIAGNOSIS — I10 Essential (primary) hypertension: Secondary | ICD-10-CM

## 2023-10-15 DIAGNOSIS — R2681 Unsteadiness on feet: Secondary | ICD-10-CM | POA: Diagnosis not present

## 2023-10-15 DIAGNOSIS — R262 Difficulty in walking, not elsewhere classified: Secondary | ICD-10-CM | POA: Diagnosis not present

## 2023-10-15 DIAGNOSIS — R051 Acute cough: Secondary | ICD-10-CM

## 2023-10-15 DIAGNOSIS — I872 Venous insufficiency (chronic) (peripheral): Secondary | ICD-10-CM | POA: Diagnosis not present

## 2023-10-15 DIAGNOSIS — R0602 Shortness of breath: Secondary | ICD-10-CM | POA: Diagnosis not present

## 2023-10-15 DIAGNOSIS — G459 Transient cerebral ischemic attack, unspecified: Secondary | ICD-10-CM | POA: Diagnosis not present

## 2023-10-15 DIAGNOSIS — R4189 Other symptoms and signs involving cognitive functions and awareness: Secondary | ICD-10-CM | POA: Diagnosis not present

## 2023-10-15 DIAGNOSIS — N390 Urinary tract infection, site not specified: Secondary | ICD-10-CM | POA: Diagnosis not present

## 2023-10-15 DIAGNOSIS — Z130 Encounter for screening for diseases of the blood and blood-forming organs and certain disorders involving the immune mechanism: Secondary | ICD-10-CM | POA: Diagnosis not present

## 2023-10-15 DIAGNOSIS — M6281 Muscle weakness (generalized): Secondary | ICD-10-CM | POA: Diagnosis not present

## 2023-10-15 DIAGNOSIS — R7989 Other specified abnormal findings of blood chemistry: Secondary | ICD-10-CM | POA: Diagnosis not present

## 2023-10-15 DIAGNOSIS — R278 Other lack of coordination: Secondary | ICD-10-CM | POA: Diagnosis not present

## 2023-10-15 LAB — CBC AND DIFFERENTIAL
HCT: 34 — AB (ref 36–46)
Hemoglobin: 11.2 — AB (ref 12.0–16.0)
Neutrophils Absolute: 3436
Platelets: 221 10*3/uL (ref 150–400)
WBC: 6.9

## 2023-10-15 LAB — BASIC METABOLIC PANEL WITH GFR
BUN: 24 — AB (ref 4–21)
CO2: 27 — AB (ref 13–22)
Chloride: 106 (ref 99–108)
Creatinine: 0.6 (ref 0.5–1.1)
Glucose: 86
Potassium: 4.2 meq/L (ref 3.5–5.1)
Sodium: 141 (ref 137–147)

## 2023-10-15 LAB — COMPREHENSIVE METABOLIC PANEL WITH GFR
Albumin: 3.5 (ref 3.5–5.0)
Calcium: 8.9 (ref 8.7–10.7)
Globulin: 1.8
eGFR: 82

## 2023-10-15 LAB — HEPATIC FUNCTION PANEL
ALT: 13 U/L (ref 7–35)
AST: 13 (ref 13–35)
Alkaline Phosphatase: 54 (ref 25–125)
Bilirubin, Total: 0.2

## 2023-10-15 LAB — CBC: RBC: 3.69 — AB (ref 3.87–5.11)

## 2023-10-15 NOTE — Progress Notes (Signed)
 Location:  [facility] Room Number: 14/A [name]:  SNF [phone]) Provider:  Arnetha Bhat, NP   Marguerite Shiley, MD  Patient Care Team: Marguerite Shiley, MD as PCP - General (Internal Medicine) Avanell Leigh, MD as PCP - Cardiology (Cardiology) Jolinda Necessary, MD (Inactive) as Consulting Physician (Gastroenterology) Orvan Blanch, MD as Attending Physician (Urology) Avanell Leigh, MD as Consulting Physician (Cardiology) Prescott Brodie, MD (Inactive) as Consulting Physician (Otolaryngology) Bartholome Boron, DDS (Dentistry) Oris Birmingham, MD as Consulting Physician (Ophthalmology) Arvil Birks, MD as Consulting Physician (Orthopedic Surgery) Aldean Hummingbird, MD as Consulting Physician (General Surgery)  Extended Emergency Contact Information Primary Emergency Contact: [facility] Address: [address] [place] [address] [place] Home Phone: [phone] Mobile Phone: [phone] Relation: Son Secondary Emergency Contact: Jerene Monks Address: [address]          [place], [place] [address] [place] Home Phone: [phone] Work Phone: [phone] Mobile Phone: [phone] Relation: Daughter  Code Status:  DNR Goals of care: Advanced Directive information    09/26/2023    9:26 AM  Advanced Directives  Does Patient Have a Medical Advance Directive? Yes  Type of Estate agent of [place];Out of facility DNR (pink MOST or yellow form);Living will  Does patient want to make changes to medical advance directive? No - Patient declined  Copy of Healthcare Power of Attorney in Chart? Yes - validated most recent copy scanned in chart (See row information)  Pre-existing out of facility DNR order (yellow form or pink MOST form) Pink MOST/Yellow Form most recent copy in chart - Physician notified to receive inpatient order     Chief Complaint  Patient presents with    Acute Visit    Acute cough/ wheezing    HPI:  Pt is a 89 y.o. female seen today for acute visit due to acute cough and wheezing.   She currently resides on the skilled nursing unit at [facility]. PMH: hypertension, hyperlipidemia, macular degeneration, mild cognitive impairment, recurrent UTI, GERD, H/o TIA.   Intermittent lower leg edema x 3 weeks. 05/30 she was found to have wheezing cough. Denied cold symptoms. 05/31 CXR unremarkable, no infiltrates or effusion. 06/02 BNP 96, BUN/creat 24/0.64, Na+ 141, K+ 4.2, Co2 106, calcium  8.9, WBC 6.9, hgb 11.2, hct 34.4, plts 221. She was prescribed furosemide  20 mg / KCL 10 meq daily x 3 days. She was also given duonebs x 3 days. Today, she denies cough, shortness of breath or wheezing. She is able to participate in PT without difficulty. Afebrile. Vitals stable.   Cognitive impairment- MMSE 28/30 08/2022, recent BIMS 8/15 (05/23)> was 8/15 (02/25), was 12/15 (11/14) HTN- BUN/creat 24/0.64 10/15/2023, remains on metoprolol  TIA- noted in 2014, not followed by neurology, remains on Plavix  Recurrent UTI- followed by urology, remains on Keflex  Unstable gait- ambulates with wheelchair, 1+ assist with ambulation, working with PT at this time, no recent falls      Past Medical History:  Diagnosis Date   Cervicalgia    Chest pain at rest 01/07/2012   Displacement of cervical intervertebral disc without myelopathy    Diverticulosis of colon (without mention of hemorrhage)    Female stress incontinence    GERD (gastroesophageal reflux disease)    no peds occasional pepcid   Inguinal hernia without mention of obstruction or gangrene, unilateral or unspecified, (not specified as recurrent)  Internal hemorrhoids without mention of complication    Lumbago    Macular degeneration (senile) of retina, unspecified    Osteoarthrosis, unspecified whether generalized or localized, unspecified site    Other and unspecified hyperlipidemia    Pain in  joint, hand    Palpitations    Reflux esophagitis    Scoliosis (and kyphoscoliosis), idiopathic    Senile osteoporosis    Skin disorder    Thyroid  disease    TIA (transient ischemic attack)    Unruptured popliteal cyst 06/24/2014   Right knee    Unspecified essential hypertension    Unspecified glaucoma(365.9)    Unspecified hypothyroidism    Unspecified vitamin D  deficiency    Past Surgical History:  Procedure Laterality Date   ABDOMINAL HYSTERECTOMY  1975   Dr Delaine Favorite   APPENDECTOMY  1988   cardiolite myocardial perfusion study     DOPPLER ECHOCARDIOGRAPHY     EYE SURGERY Bilateral 2009   cataract removed right eye, Dr Neil Balls   FOOT SURGERY  august 2013   Hewitt, MD   INGUINAL HERNIA REPAIR Bilateral    w/mesh   INGUINAL HERNIA REPAIR Left 08/03/2017   Procedure: LAPAROSCOPIC LEFT INGUINAL HERNIA REPAIR WITH MESH;  Surgeon: Aldean Hummingbird, MD;  Location: [facility] OR;  Service: General;  Laterality: Left;   INGUINAL HERNIA REPAIR Right 08/03/2017   Procedure: HERNIA REPAIR RIGHT INGUINAL ADULT WITH MESH;  Surgeon: Aldean Hummingbird, MD;  Location: [facility] OR;  Service: General;  Laterality: Right;   INGUINAL HERNIA REPAIR     Dr. Elvan Hamel 04-24-18   INGUINAL HERNIA REPAIR Right 04/24/2018   Procedure: DIAGNOSTIC LAPAROSCOPIC, OPENREPAIR OF RECURRENT RIGHT INGUINAL HERNIA WITH MESH ERAS PATHWAY;  Surgeon: Aldean Hummingbird, MD;  Location: [facility] ORS;  Service: General;  Laterality: Right;   INSERTION OF MESH Bilateral 08/03/2017   Procedure: INSERTION OF MESH;  Surgeon: Aldean Hummingbird, MD;  Location: [facility] OR;  Service: General;  Laterality: Bilateral;   NM MYOVIEW  LTD     negative   TONSILLECTOMY  1937   TOOTH EXTRACTION  09/16/13   Dr Isabell Manzanilla    Allergies  Allergen Reactions   Trimethoprim Other (See Comments)    Headaches/ ear pressure    Erythromycin Other (See Comments)    UNSPECIFIED REACTION    Macrodantin [Nitrofurantoin Macrocrystal] Other (See Comments)    UNSPECIFIED REACTION    Other      Honeydew Melon   Penicillins Other (See Comments)    UNSPECIFIED REACTION  Has patient had a PCN reaction causing immediate rash, facial/tongue/throat swelling, SOB or lightheadedness with hypotension: Unknown Has patient had a PCN reaction causing severe rash involving mucus membranes or skin necrosis: Unknown Has patient had a PCN reaction that required hospitalization: No Has patient had a PCN reaction occurring within the last 10 years: No If all of the above answers are "NO", then may proceed with Cephalosporin use.   Sulfa Antibiotics Other (See Comments)    UNSPECIFIED REACTION    Latex Itching    Outpatient Encounter Medications as of 10/15/2023  Medication Sig   acetaminophen  (TYLENOL ) 500 MG tablet Take 500 mg by mouth 2 (two) times daily as needed for mild pain or headache.    albuterol  (VENTOLIN  HFA) 108 (90 Base) MCG/ACT inhaler Inhale 2 puffs into the lungs every 6 (six) hours as needed for wheezing or shortness of breath.   cephALEXin  (KEFLEX ) 250 MG capsule Take 250 mg by mouth daily.   Cholecalciferol  (VITAMIN D3) 50 MCG (2000 UT) TABS Take  by mouth.   clopidogrel  (PLAVIX ) 75 MG tablet TAKE ONE TABLET BY MOUTH DAILY   Cranberry-Vitamin C-Probiotic (AZO CRANBERRY PO) Take 1 tablet by mouth daily.   furosemide  (LASIX ) 20 MG tablet Take 1 tablet (20 mg total) by mouth daily for 2 days.   Menthol, Topical Analgesic, (BIOFREEZE ROLL-ON) 4 % GEL Apply 1 Application topically in the morning and at bedtime. Apply to right upper arm   Menthol, Topical Analgesic, (BIOFREEZE ROLL-ON) 4 % GEL Apply 1 application  topically 2 (two) times daily. Apply to right knee   metoprolol  succinate (TOPROL -XL) 25 MG 24 hr tablet Take one tablet by mouth once daily to regulate heart and control blood pressure.   Multiple Vitamins-Minerals (PRESERVISION AREDS 2 PO) Take 1 capsule by mouth 2 (two) times daily.    potassium chloride  (KLOR-CON  M) 10 MEQ tablet Take 1 tablet (10 mEq total) by mouth 2 (two)  times daily for 2 days.   vitamin C (ASCORBIC ACID) 500 MG tablet Take 500 mg by mouth daily with supper.    Zinc Oxide 10 % OINT Apply 1 Application topically as needed.   No facility-administered encounter medications on file as of 10/15/2023.    Review of Systems  Constitutional:  Negative for fatigue and fever.  HENT:  Positive for hearing loss. Negative for congestion, sore throat and trouble swallowing.   Respiratory:  Negative for cough, shortness of breath and wheezing.   Cardiovascular:  Positive for leg swelling. Negative for chest pain.  Gastrointestinal:  Negative for abdominal distention and abdominal pain.  Genitourinary:  Negative for dysuria and frequency.  Musculoskeletal:  Positive for arthralgias and gait problem.  Skin:  Negative for wound.  Neurological:  Positive for weakness. Negative for dizziness and headaches.  Psychiatric/Behavioral:  Positive for confusion and dysphoric mood. Negative for sleep disturbance. The patient is not nervous/anxious.     Immunization History  Administered Date(s) Administered   Fluad Quad(high Dose 65+) 02/04/2019, 03/01/2022   Influenza Whole 02/24/2011, 02/12/2012   Influenza, High Dose Seasonal PF 02/01/2017, 02/25/2018, 02/11/2020, 03/13/2023   Influenza,inj,Quad PF,6+ Mos 02/20/2013, 02/10/2014, 02/19/2015, 02/18/2016   Influenza-Unspecified 02/16/2021   Moderna Covid-19 Fall Seasonal Vaccine 38yrs & older 02/09/2022, 03/21/2023   Moderna Sars-Covid-2 Vaccination 05/19/2019, 06/16/2019, 03/29/2020, 09/27/2020   Pneumococcal Conjugate-13 06/24/2014   Pneumococcal Polysaccharide-23 04/12/1998   Td 02/21/1996, 07/23/2003   Tdap 03/20/2010   Zoster Recombinant(Shingrix) 09/19/2016, 12/02/2016   Zoster, Live 09/15/2005   Pertinent  Health Maintenance Due  Topic Date Due   INFLUENZA VACCINE  12/14/2023   DEXA SCAN  Completed      09/06/2022    2:27 PM 12/27/2022    2:15 PM 04/18/2023    4:49 PM 08/27/2023    2:51 PM 09/14/2023    12:18 PM  Fall Risk  Falls in the past year? 1 0 0 1 1  Was there an injury with Fall? 1 0 0 0 0  Fall Risk Category Calculator 2 0 0 1 1  Patient at Risk for Falls Due to History of fall(s);Impaired balance/gait;Impaired mobility No Fall Risks History of fall(s);Impaired balance/gait;Impaired mobility History of fall(s);Impaired mobility History of fall(s);Impaired balance/gait  Fall risk Follow up Falls evaluation completed;Education provided;Falls prevention discussed Falls evaluation completed;Education provided;Falls prevention discussed Falls evaluation completed;Education provided;Falls prevention discussed Falls evaluation completed;Education provided Falls evaluation completed;Education provided   Functional Status Survey:    Vitals:   10/15/23 1634  BP: (!) 141/75  Pulse: 75  Resp: 18  Temp: (!) 97.2 F (  36.2 C)  SpO2: 96%  Weight: 171 lb 11.2 oz (77.9 kg)  Height: 5\' 2"  (1.575 m)   Body mass index is 31.4 kg/m. Physical Exam Vitals reviewed.  Constitutional:      General: She is not in acute distress. HENT:     Head: Normocephalic.     Right Ear: Tympanic membrane normal.     Left Ear: Tympanic membrane normal.     Nose: Nose normal. No rhinorrhea.     Mouth/Throat:     Mouth: Mucous membranes are moist.     Pharynx: No posterior oropharyngeal erythema.  Eyes:     General:        Right eye: No discharge.        Left eye: No discharge.     Pupils: Pupils are equal, round, and reactive to light.  Cardiovascular:     Rate and Rhythm: Normal rate and regular rhythm.     Pulses: Normal pulses.     Heart sounds: Normal heart sounds.  Pulmonary:     Effort: Pulmonary effort is normal. No respiratory distress.     Breath sounds: Normal breath sounds. No wheezing, rhonchi or rales.  Abdominal:     General: Bowel sounds are normal. There is no distension.     Palpations: Abdomen is soft.     Tenderness: There is no abdominal tenderness.  Musculoskeletal:      Cervical back: Neck supple.     Right lower leg: Edema present.     Left lower leg: Edema present.     Comments: BLE non pitting, compression stockings on  Skin:    General: Skin is warm.     Capillary Refill: Capillary refill takes less than 2 seconds.  Neurological:     General: No focal deficit present.     Mental Status: She is alert and oriented to person, place, and time.     Motor: Weakness present.     Gait: Gait abnormal.  Psychiatric:        Mood and Affect: Mood normal.     Labs reviewed: Recent Labs    10/31/22 0000 03/05/23 0000  NA 141 140  K 3.7 4.2  CL 104 105  CO2 24* 27*  BUN 29* 24*  CREATININE 0.8 0.6  CALCIUM  9.5 8.8   Recent Labs    10/31/22 0000 03/05/23 0000  AST 21 14  ALT 18 16  ALKPHOS 71 71  ALBUMIN 4.1 3.4*   Recent Labs    10/31/22 0000 03/05/23 0000  WBC 10.3 7.4  NEUTROABS  --  4,151.00  HGB 13.8 12.2  HCT 42 37  PLT 234 219   Lab Results  Component Value Date   TSH 3.20 10/31/2022   Lab Results  Component Value Date   HGBA1C 5.3 05/02/2013   Lab Results  Component Value Date   CHOL 188 07/10/2019   HDL 75 07/10/2019   LDLCALC 94 07/10/2019   TRIG 96 07/10/2019   CHOLHDL 2.5 07/10/2019    Significant Diagnostic Results in last 30 days:  No results found.  Assessment/Plan 1. Acute cough (Primary) - noted 05/30 - 05/31 CXR unremarkable - improved with furosemide  20 mg/ KCL 10 mew x 3 days - improved with duonebs x 3 days - lung sounds clear today  2. Cognitive impairment - recent BIMS 8/15 (05/23) - no behaviors - dependent with some ADLs  - cont skilled nursing - not on medication  3. Essential hypertension - controlled with metoprolol   4. TIA (transient ischemic attack) - cont plavix   5. Recurrent UTI - stable with Keflex   6. Unstable gait - no recent falls - ambulates with wheelchair - working with PT - cont skilled nursing   7. Chronic venous insufficiency - intermittent BLE edema -  06/02 BNP 96 - cont compression stockings    Family/ staff Communication: plan discussed with patient and nurse  Labs/tests ordered:  none

## 2023-10-16 DIAGNOSIS — R262 Difficulty in walking, not elsewhere classified: Secondary | ICD-10-CM | POA: Diagnosis not present

## 2023-10-16 DIAGNOSIS — M6281 Muscle weakness (generalized): Secondary | ICD-10-CM | POA: Diagnosis not present

## 2023-10-16 DIAGNOSIS — R278 Other lack of coordination: Secondary | ICD-10-CM | POA: Diagnosis not present

## 2023-10-17 ENCOUNTER — Non-Acute Institutional Stay (SKILLED_NURSING_FACILITY): Payer: Self-pay | Admitting: Internal Medicine

## 2023-10-17 ENCOUNTER — Encounter: Payer: Self-pay | Admitting: Internal Medicine

## 2023-10-17 DIAGNOSIS — J441 Chronic obstructive pulmonary disease with (acute) exacerbation: Secondary | ICD-10-CM

## 2023-10-17 DIAGNOSIS — R051 Acute cough: Secondary | ICD-10-CM | POA: Diagnosis not present

## 2023-10-17 NOTE — Progress Notes (Signed)
 Location: [facility] Room Number: N14-A [name]:  SNF (31)  Provider:   Code Status:  Goals of Care:     10/17/2023    2:47 PM  Advanced Directives  Does Patient Have a Medical Advance Directive? Yes  Type of Estate agent of [place];Out of facility DNR (pink MOST or yellow form);Living will  Does patient want to make changes to medical advance directive? No - Patient declined  Copy of Healthcare Power of Attorney in Chart? Yes - validated most recent copy scanned in chart (See row information)  Pre-existing out of facility DNR order (yellow form or pink MOST form) Pink MOST/Yellow Form most recent copy in chart - Physician notified to receive inpatient order     Chief Complaint  Patient presents with   Cough    Cough and Shortness of Breathe    HPI: Patient is a 89 y.o. female seen today for an acute visit for Cough and Wheezing  L[facility] in SNF in [facility]   Patient has a history of hypertension, hyperlipidemia, macular degeneration, mild cognitive impairment, recurrent UTI, GERD, H/o TIA   Was seen few days ago for Cough Chest Xray negative for any acute process BNP came back as 96 She was given Lasix  and Duo Nebs Patient did get better but today she is coughing Again and has Wheezing Wt Readings from Last 3 Encounters:  10/17/23 171 lb 11.2 oz (77.9 kg)  10/15/23 171 lb 11.2 oz (77.9 kg)  10/12/23 173 lb 9.6 oz (78.7 kg)      Past Medical History:  Diagnosis Date   Cervicalgia    Chest pain at rest 01/07/2012   Displacement of cervical intervertebral disc without myelopathy    Diverticulosis of colon (without mention of hemorrhage)    Female stress incontinence    GERD (gastroesophageal reflux disease)    no peds occasional pepcid   Inguinal hernia without mention of obstruction or gangrene, unilateral or unspecified, (not specified as recurrent)    Internal hemorrhoids without mention of complication    Lumbago     Macular degeneration (senile) of retina, unspecified    Osteoarthrosis, unspecified whether generalized or localized, unspecified site    Other and unspecified hyperlipidemia    Pain in joint, hand    Palpitations    Reflux esophagitis    Scoliosis (and kyphoscoliosis), idiopathic    Senile osteoporosis    Skin disorder    Thyroid  disease    TIA (transient ischemic attack)    Unruptured popliteal cyst 06/24/2014   Right knee    Unspecified essential hypertension    Unspecified glaucoma(365.9)    Unspecified hypothyroidism    Unspecified vitamin D  deficiency     Past Surgical History:  Procedure Laterality Date   ABDOMINAL HYSTERECTOMY  1975   Dr Delaine Favorite   APPENDECTOMY  1988   cardiolite myocardial perfusion study     DOPPLER ECHOCARDIOGRAPHY     EYE SURGERY Bilateral 2009   cataract removed right eye, Dr Neil Balls   FOOT SURGERY  august 2013   Hewitt, MD   INGUINAL HERNIA REPAIR Bilateral    w/mesh   INGUINAL HERNIA REPAIR Left 08/03/2017   Procedure: LAPAROSCOPIC LEFT INGUINAL HERNIA REPAIR WITH MESH;  Surgeon: Aldean Hummingbird, MD;  Location: [facility] OR;  Service: General;  Laterality: Left;   INGUINAL HERNIA REPAIR Right 08/03/2017   Procedure: HERNIA REPAIR RIGHT INGUINAL ADULT WITH MESH;  Surgeon: Aldean Hummingbird, MD;  Location: [facility] OR;  Service:  General;  Laterality: Right;   INGUINAL HERNIA REPAIR     Dr. Elvan Hamel 04-24-18   INGUINAL HERNIA REPAIR Right 04/24/2018   Procedure: DIAGNOSTIC LAPAROSCOPIC, OPENREPAIR OF RECURRENT RIGHT INGUINAL HERNIA WITH MESH ERAS PATHWAY;  Surgeon: Aldean Hummingbird, MD;  Location: [facility] ORS;  Service: General;  Laterality: Right;   INSERTION OF MESH Bilateral 08/03/2017   Procedure: INSERTION OF MESH;  Surgeon: Aldean Hummingbird, MD;  Location: [facility] OR;  Service: General;  Laterality: Bilateral;   NM MYOVIEW  LTD     negative   TONSILLECTOMY  1937   TOOTH EXTRACTION  09/16/13   Dr Isabell Manzanilla    Allergies  Allergen Reactions   Trimethoprim Other (See Comments)     Headaches/ ear pressure    Erythromycin Other (See Comments)    UNSPECIFIED REACTION    Macrodantin [Nitrofurantoin Macrocrystal] Other (See Comments)    UNSPECIFIED REACTION    Other     Honeydew Melon   Penicillins Other (See Comments)    UNSPECIFIED REACTION  Has patient had a PCN reaction causing immediate rash, facial/tongue/throat swelling, SOB or lightheadedness with hypotension: Unknown Has patient had a PCN reaction causing severe rash involving mucus membranes or skin necrosis: Unknown Has patient had a PCN reaction that required hospitalization: No Has patient had a PCN reaction occurring within the last 10 years: No If all of the above answers are "NO", then may proceed with Cephalosporin use.   Sulfa Antibiotics Other (See Comments)    UNSPECIFIED REACTION    Latex Itching    Outpatient Encounter Medications as of 10/17/2023  Medication Sig   acetaminophen  (TYLENOL ) 500 MG tablet Take 500 mg by mouth 2 (two) times daily as needed for mild pain or headache.    albuterol  (VENTOLIN  HFA) 108 (90 Base) MCG/ACT inhaler Inhale 2 puffs into the lungs every 6 (six) hours as needed for wheezing or shortness of breath.   cephALEXin  (KEFLEX ) 250 MG capsule Take 250 mg by mouth daily.   Cholecalciferol  (VITAMIN D3) 50 MCG (2000 UT) TABS Take by mouth.   clopidogrel  (PLAVIX ) 75 MG tablet TAKE ONE TABLET BY MOUTH DAILY   Cranberry-Vitamin C-Probiotic (AZO CRANBERRY PO) Take 1 tablet by mouth daily.   Menthol, Topical Analgesic, (BIOFREEZE ROLL-ON) 4 % GEL Apply 1 Application topically in the morning and at bedtime. Apply to right upper arm   Menthol, Topical Analgesic, (BIOFREEZE ROLL-ON) 4 % GEL Apply 1 application  topically 2 (two) times daily. Apply to right knee   metoprolol  succinate (TOPROL -XL) 25 MG 24 hr tablet Take one tablet by mouth once daily to regulate heart and control blood pressure.   Multiple Vitamins-Minerals (PRESERVISION AREDS 2 PO) Take 1 capsule by mouth 2 (two)  times daily.    vitamin C (ASCORBIC ACID) 500 MG tablet Take 500 mg by mouth daily with supper.    Zinc Oxide 10 % OINT Apply 1 Application topically as needed.   No facility-administered encounter medications on file as of 10/17/2023.    Review of Systems:  Review of Systems  Constitutional:  Negative for activity change and appetite change.  HENT: Negative.    Respiratory:  Positive for cough, shortness of breath and wheezing.   Cardiovascular:  Positive for leg swelling.  Gastrointestinal:  Negative for constipation.  Genitourinary: Negative.   Musculoskeletal:  Positive for gait problem. Negative for arthralgias and myalgias.  Skin: Negative.   Neurological:  Negative for dizziness and weakness.  Psychiatric/Behavioral:  Negative for confusion, dysphoric mood and sleep disturbance.  Health Maintenance  Topic Date Due   COVID-19 Vaccine (7 - 2024-25 season) 09/18/2023   DTaP/Tdap/Td (4 - Td or Tdap) 09/13/2024 (Originally 03/20/2020)   INFLUENZA VACCINE  12/14/2023   Medicare Annual Wellness (AWV)  09/13/2024   Pneumonia Vaccine 24+ Years old  Completed   DEXA SCAN  Completed   Zoster Vaccines- Shingrix  Completed   HPV VACCINES  Aged Out   Meningococcal B Vaccine  Aged Out    Physical Exam: Vitals:   10/17/23 1442  BP: 130/73  Pulse: 71  Resp: 20  Temp: (!) 97.1 F (36.2 C)  SpO2: 94%  Weight: 171 lb 11.2 oz (77.9 kg)  Height: 5\' 2"  (1.575 m)   Body mass index is 31.4 kg/m. Physical Exam Vitals reviewed.  Constitutional:      Appearance: Normal appearance.  HENT:     Head: Normocephalic.     Nose: Nose normal.     Mouth/Throat:     Mouth: Mucous membranes are moist.     Pharynx: Oropharynx is clear.  Eyes:     Pupils: Pupils are equal, round, and reactive to light.  Cardiovascular:     Rate and Rhythm: Normal rate and regular rhythm.     Pulses: Normal pulses.     Heart sounds: Normal heart sounds. No murmur heard. Pulmonary:     Effort: Pulmonary  effort is normal.     Breath sounds: Wheezing and rales present.  Abdominal:     General: Abdomen is flat. Bowel sounds are normal.     Palpations: Abdomen is soft.  Musculoskeletal:        General: Swelling present.     Cervical back: Neck supple.  Skin:    General: Skin is warm.  Neurological:     General: No focal deficit present.     Mental Status: She is alert and oriented to person, place, and time.  Psychiatric:        Mood and Affect: Mood normal.        Thought Content: Thought content normal.     Labs reviewed: Basic Metabolic Panel: Recent Labs    10/31/22 0000 03/05/23 0000  NA 141 140  K 3.7 4.2  CL 104 105  CO2 24* 27*  BUN 29* 24*  CREATININE 0.8 0.6  CALCIUM  9.5 8.8  TSH 3.20  --    Liver Function Tests: Recent Labs    10/31/22 0000 03/05/23 0000  AST 21 14  ALT 18 16  ALKPHOS 71 71  ALBUMIN 4.1 3.4*   No results for input(s): "LIPASE", "AMYLASE" in the last 8760 hours. No results for input(s): "AMMONIA" in the last 8760 hours. CBC: Recent Labs    10/31/22 0000 03/05/23 0000  WBC 10.3 7.4  NEUTROABS  --  4,151.00  HGB 13.8 12.2  HCT 42 37  PLT 234 219   Lipid Panel: No results for input(s): "CHOL", "HDL", "LDLCALC", "TRIG", "CHOLHDL", "LDLDIRECT" in the last 8760 hours. Lab Results  Component Value Date   HGBA1C 5.3 05/02/2013    Procedures since last visit: No results found.  Assessment/Plan 1. Acute cough (Primary)/COPD / Bronchitis Will start on Prednisone taper 40 mg every day over next 10 days Also start oN Duo nEbs BID for 7 days and then PRN     Labs/tests ordered:  * No order type specified * Next appt:  Visit date not found

## 2023-10-18 DIAGNOSIS — R278 Other lack of coordination: Secondary | ICD-10-CM | POA: Diagnosis not present

## 2023-10-18 DIAGNOSIS — M6281 Muscle weakness (generalized): Secondary | ICD-10-CM | POA: Diagnosis not present

## 2023-10-18 DIAGNOSIS — R262 Difficulty in walking, not elsewhere classified: Secondary | ICD-10-CM | POA: Diagnosis not present

## 2023-10-19 DIAGNOSIS — H353211 Exudative age-related macular degeneration, right eye, with active choroidal neovascularization: Secondary | ICD-10-CM | POA: Diagnosis not present

## 2023-10-19 DIAGNOSIS — M6281 Muscle weakness (generalized): Secondary | ICD-10-CM | POA: Diagnosis not present

## 2023-10-19 DIAGNOSIS — R278 Other lack of coordination: Secondary | ICD-10-CM | POA: Diagnosis not present

## 2023-10-19 DIAGNOSIS — R262 Difficulty in walking, not elsewhere classified: Secondary | ICD-10-CM | POA: Diagnosis not present

## 2023-10-22 DIAGNOSIS — R278 Other lack of coordination: Secondary | ICD-10-CM | POA: Diagnosis not present

## 2023-10-22 DIAGNOSIS — M6281 Muscle weakness (generalized): Secondary | ICD-10-CM | POA: Diagnosis not present

## 2023-10-22 DIAGNOSIS — R262 Difficulty in walking, not elsewhere classified: Secondary | ICD-10-CM | POA: Diagnosis not present

## 2023-10-23 DIAGNOSIS — R278 Other lack of coordination: Secondary | ICD-10-CM | POA: Diagnosis not present

## 2023-10-23 DIAGNOSIS — R262 Difficulty in walking, not elsewhere classified: Secondary | ICD-10-CM | POA: Diagnosis not present

## 2023-10-23 DIAGNOSIS — M6281 Muscle weakness (generalized): Secondary | ICD-10-CM | POA: Diagnosis not present

## 2023-10-24 DIAGNOSIS — R262 Difficulty in walking, not elsewhere classified: Secondary | ICD-10-CM | POA: Diagnosis not present

## 2023-10-24 DIAGNOSIS — R278 Other lack of coordination: Secondary | ICD-10-CM | POA: Diagnosis not present

## 2023-10-24 DIAGNOSIS — M6281 Muscle weakness (generalized): Secondary | ICD-10-CM | POA: Diagnosis not present

## 2023-10-25 DIAGNOSIS — R278 Other lack of coordination: Secondary | ICD-10-CM | POA: Diagnosis not present

## 2023-10-25 DIAGNOSIS — M6281 Muscle weakness (generalized): Secondary | ICD-10-CM | POA: Diagnosis not present

## 2023-10-25 DIAGNOSIS — R262 Difficulty in walking, not elsewhere classified: Secondary | ICD-10-CM | POA: Diagnosis not present

## 2023-10-26 DIAGNOSIS — M6281 Muscle weakness (generalized): Secondary | ICD-10-CM | POA: Diagnosis not present

## 2023-10-26 DIAGNOSIS — R262 Difficulty in walking, not elsewhere classified: Secondary | ICD-10-CM | POA: Diagnosis not present

## 2023-10-26 DIAGNOSIS — R278 Other lack of coordination: Secondary | ICD-10-CM | POA: Diagnosis not present

## 2023-10-29 DIAGNOSIS — M6281 Muscle weakness (generalized): Secondary | ICD-10-CM | POA: Diagnosis not present

## 2023-10-29 DIAGNOSIS — R262 Difficulty in walking, not elsewhere classified: Secondary | ICD-10-CM | POA: Diagnosis not present

## 2023-10-29 DIAGNOSIS — R278 Other lack of coordination: Secondary | ICD-10-CM | POA: Diagnosis not present

## 2023-10-30 DIAGNOSIS — R278 Other lack of coordination: Secondary | ICD-10-CM | POA: Diagnosis not present

## 2023-10-30 DIAGNOSIS — M6281 Muscle weakness (generalized): Secondary | ICD-10-CM | POA: Diagnosis not present

## 2023-10-30 DIAGNOSIS — R262 Difficulty in walking, not elsewhere classified: Secondary | ICD-10-CM | POA: Diagnosis not present

## 2023-10-31 DIAGNOSIS — R262 Difficulty in walking, not elsewhere classified: Secondary | ICD-10-CM | POA: Diagnosis not present

## 2023-10-31 DIAGNOSIS — R278 Other lack of coordination: Secondary | ICD-10-CM | POA: Diagnosis not present

## 2023-10-31 DIAGNOSIS — M6281 Muscle weakness (generalized): Secondary | ICD-10-CM | POA: Diagnosis not present

## 2023-11-02 DIAGNOSIS — R278 Other lack of coordination: Secondary | ICD-10-CM | POA: Diagnosis not present

## 2023-11-02 DIAGNOSIS — M6281 Muscle weakness (generalized): Secondary | ICD-10-CM | POA: Diagnosis not present

## 2023-11-02 DIAGNOSIS — R262 Difficulty in walking, not elsewhere classified: Secondary | ICD-10-CM | POA: Diagnosis not present

## 2023-11-05 ENCOUNTER — Non-Acute Institutional Stay (SKILLED_NURSING_FACILITY): Payer: Self-pay | Admitting: Orthopedic Surgery

## 2023-11-05 ENCOUNTER — Encounter: Payer: Self-pay | Admitting: Orthopedic Surgery

## 2023-11-05 DIAGNOSIS — R051 Acute cough: Secondary | ICD-10-CM | POA: Diagnosis not present

## 2023-11-05 DIAGNOSIS — R4189 Other symptoms and signs involving cognitive functions and awareness: Secondary | ICD-10-CM

## 2023-11-05 DIAGNOSIS — R278 Other lack of coordination: Secondary | ICD-10-CM | POA: Diagnosis not present

## 2023-11-05 DIAGNOSIS — M6281 Muscle weakness (generalized): Secondary | ICD-10-CM | POA: Diagnosis not present

## 2023-11-05 DIAGNOSIS — L03116 Cellulitis of left lower limb: Secondary | ICD-10-CM

## 2023-11-05 DIAGNOSIS — R262 Difficulty in walking, not elsewhere classified: Secondary | ICD-10-CM | POA: Diagnosis not present

## 2023-11-05 MED ORDER — DOXYCYCLINE HYCLATE 100 MG PO TABS: 100.0000 mg | ORAL_TABLET | Freq: Two times a day (BID) | ORAL | Status: AC

## 2023-11-05 NOTE — Progress Notes (Unsigned)
 Location:  [facility] Room Number: N14-A [name]:  SNF [phone]) Provider:  Gil Greig Meredith [name] Charlanne Fredia LITTIE, MD  Patient Care Team: Charlanne Fredia LITTIE, MD as PCP - General (Internal Medicine) Meredith Dorn PARAS, MD as PCP - Cardiology (Cardiology) Celestia Agent, MD (Inactive) as Consulting Physician (Gastroenterology) Nicholaus [name] DOUGLAS, MD as Attending Physician (Urology) Meredith Dorn PARAS, MD as Consulting Physician (Cardiology) Meredith Lonni FORBES, MD (Inactive) as Consulting Physician (Otolaryngology) [name], DDS (Dentistry) Meredith Credit, MD as Consulting Physician (Ophthalmology) Meredith Easter, MD as Consulting Physician (Orthopedic Surgery) Meredith Locus, MD as Consulting Physician (General Surgery)  Extended Emergency Contact Information Primary Emergency Contact: [name] Ltd Address: [address] [place] [address] [place] Home Phone: [phone] Mobile Phone: [phone] Relation: Son Secondary Emergency Contact: [name] Address: [address]          [place], [place] [address] [place] Home Phone: [phone] Work Phone: [phone] Mobile Phone: [phone] Relation: Daughter  Code Status:  DNR Goals of care: Advanced Directive information    11/05/2023    4:28 PM  Advanced Directives  Does Patient Have a Medical Advance Directive? Yes  Type of Estate agent of [place];Out of facility DNR (pink MOST or yellow form);Living will  Does patient want to make changes to medical advance directive? No - Patient declined  Copy of Healthcare Power of Attorney in Chart? Yes - validated most recent copy scanned in chart (See row information)  Pre-existing out of facility DNR order (yellow form or pink MOST form) Pink MOST/Yellow Form most recent copy in chart - Physician notified to receive inpatient order     Chief Complaint  Patient presents with    Acute Visit    Left shin cellulitis    HPI:  Pt is a 89 y.o. female seen today for an acute visit for    Past Medical History:  Diagnosis Date   Cervicalgia    Chest pain at rest 01/07/2012   Displacement of cervical intervertebral disc without myelopathy    Diverticulosis of colon (without mention of hemorrhage)    Female stress incontinence    GERD (gastroesophageal reflux disease)    no peds occasional pepcid   Inguinal hernia without mention of obstruction or gangrene, unilateral or unspecified, (not specified as recurrent)    Internal hemorrhoids without mention of complication    Lumbago    Macular degeneration (senile) of retina, unspecified    Osteoarthrosis, unspecified whether generalized or localized, unspecified site    Other and unspecified hyperlipidemia    Pain in joint, hand    Palpitations    Reflux esophagitis    Scoliosis (and kyphoscoliosis), idiopathic    Senile osteoporosis    Skin disorder    Thyroid  disease    TIA (transient ischemic attack)    Unruptured popliteal cyst 06/24/2014   Right knee    Unspecified essential hypertension    Unspecified glaucoma(365.9)    Unspecified hypothyroidism    Unspecified vitamin D  deficiency    Past Surgical History:  Procedure Laterality Date   ABDOMINAL HYSTERECTOMY  1975   Dr Sharron   APPENDECTOMY  1988   cardiolite myocardial perfusion study     DOPPLER ECHOCARDIOGRAPHY     EYE SURGERY Bilateral 2009   cataract removed right eye, Dr Carrie   FOOT SURGERY  august 2013   Hewitt, MD   INGUINAL HERNIA REPAIR Bilateral  w/mesh   INGUINAL HERNIA REPAIR Left 08/03/2017   Procedure: LAPAROSCOPIC LEFT INGUINAL HERNIA REPAIR WITH MESH;  Surgeon: Meredith Locus, MD;  Location: [facility] OR;  Service: General;  Laterality: Left;   INGUINAL HERNIA REPAIR Right 08/03/2017   Procedure: HERNIA REPAIR RIGHT INGUINAL ADULT WITH MESH;  Surgeon: Meredith Locus, MD;  Location: [facility] OR;  Service: General;  Laterality: Right;   INGUINAL  HERNIA REPAIR     Dr. Tanda 04-24-18   INGUINAL HERNIA REPAIR Right 04/24/2018   Procedure: DIAGNOSTIC LAPAROSCOPIC, OPENREPAIR OF RECURRENT RIGHT INGUINAL HERNIA WITH MESH ERAS PATHWAY;  Surgeon: Meredith Locus, MD;  Location: [facility] ORS;  Service: General;  Laterality: Right;   INSERTION OF MESH Bilateral 08/03/2017   Procedure: INSERTION OF MESH;  Surgeon: Meredith Locus, MD;  Location: [facility] OR;  Service: General;  Laterality: Bilateral;   NM MYOVIEW  LTD     negative   TONSILLECTOMY  1937   TOOTH EXTRACTION  09/16/13   Dr Maryjean    Allergies  Allergen Reactions   Trimethoprim Other (See Comments)    Headaches/ ear pressure    Erythromycin Other (See Comments)    UNSPECIFIED REACTION    Macrodantin [Nitrofurantoin Macrocrystal] Other (See Comments)    UNSPECIFIED REACTION    Other     Honeydew Melon   Penicillins Other (See Comments)    UNSPECIFIED REACTION  Has patient had a PCN reaction causing immediate rash, facial/tongue/throat swelling, SOB or lightheadedness with hypotension: Unknown Has patient had a PCN reaction causing severe rash involving mucus membranes or skin necrosis: Unknown Has patient had a PCN reaction that required hospitalization: No Has patient had a PCN reaction occurring within the last 10 years: No If all of the above answers are NO, then may proceed with Cephalosporin use.   Sulfa Antibiotics Other (See Comments)    UNSPECIFIED REACTION    Latex Itching    Outpatient Encounter Medications as of 11/05/2023  Medication Sig   acetaminophen  (TYLENOL ) 500 MG tablet Take 500 mg by mouth 2 (two) times daily as needed for mild pain or headache.    albuterol  (VENTOLIN  HFA) 108 (90 Base) MCG/ACT inhaler Inhale 2 puffs into the lungs every 6 (six) hours as needed for wheezing or shortness of breath.   cephALEXin  (KEFLEX ) 250 MG capsule Take 250 mg by mouth daily.   Cholecalciferol  (VITAMIN D3) 50 MCG (2000 UT) TABS Take by mouth.   clopidogrel  (PLAVIX ) 75 MG tablet  TAKE ONE TABLET BY MOUTH DAILY   Cranberry-Vitamin C-Probiotic (AZO CRANBERRY PO) Take 1 tablet by mouth daily.   doxycycline  (VIBRA -TABS) 100 MG tablet Take 1 tablet (100 mg total) by mouth 2 (two) times daily for 7 days.   ipratropium-albuterol  (DUONEB) 0.5-2.5 (3) MG/3ML SOLN Inhale 3 mLs into the lungs every 12 (twelve) hours.   Menthol, Topical Analgesic, (BIOFREEZE ROLL-ON) 4 % GEL Apply 1 Application topically in the morning and at bedtime. Apply to right upper arm   Menthol, Topical Analgesic, (BIOFREEZE ROLL-ON) 4 % GEL Apply 1 application  topically 2 (two) times daily. Apply to right knee   metoprolol  succinate (TOPROL -XL) 25 MG 24 hr tablet Take one tablet by mouth once daily to regulate heart and control blood pressure.   Multiple Vitamins-Minerals (PRESERVISION AREDS 2 PO) Take 1 capsule by mouth 2 (two) times daily.    vitamin C (ASCORBIC ACID) 500 MG tablet Take 500 mg by mouth daily with supper.    Zinc Oxide 10 % OINT Apply 1 Application topically as needed.  No facility-administered encounter medications on file as of 11/05/2023.    Review of Systems  Immunization History  Administered Date(s) Administered   Fluad Quad(high Dose 65+) 02/04/2019, 03/01/2022   Influenza Whole 02/24/2011, 02/12/2012   Influenza, High Dose Seasonal PF 02/01/2017, 02/25/2018, 02/11/2020, 03/13/2023   Influenza,inj,Quad PF,6+ Mos 02/20/2013, 02/10/2014, 02/19/2015, 02/18/2016   Influenza-Unspecified 02/16/2021   Moderna Covid-19 Fall Seasonal Vaccine 65yrs & older 02/09/2022, 03/21/2023   Moderna Sars-Covid-2 Vaccination 05/19/2019, 06/16/2019, 03/29/2020, 09/27/2020   Pneumococcal Conjugate-13 06/24/2014   Pneumococcal Polysaccharide-23 04/12/1998   Td 02/21/1996, 07/23/2003   Tdap 03/20/2010   Zoster Recombinant(Shingrix) 09/19/2016, 12/02/2016   Zoster, Live 09/15/2005   Pertinent  Health Maintenance Due  Topic Date Due   INFLUENZA VACCINE  12/14/2023   DEXA SCAN  Completed       09/06/2022    2:27 PM 12/27/2022    2:15 PM 04/18/2023    4:49 PM 08/27/2023    2:51 PM 09/14/2023   12:18 PM  Fall Risk  Falls in the past year? 1 0 0 1 1  Was there an injury with Fall? 1 0 0 0 0  Fall Risk Category Calculator 2 0 0 1 1  Patient at Risk for Falls Due to History of fall(s);Impaired balance/gait;Impaired mobility No Fall Risks History of fall(s);Impaired balance/gait;Impaired mobility History of fall(s);Impaired mobility History of fall(s);Impaired balance/gait  Fall risk Follow up Falls evaluation completed;Education provided;Falls prevention discussed Falls evaluation completed;Education provided;Falls prevention discussed Falls evaluation completed;Education provided;Falls prevention discussed Falls evaluation completed;Education provided Falls evaluation completed;Education provided   Functional Status Survey:    Vitals:   11/05/23 1627 11/05/23 1628  BP: (!) 145/87 (!) 173/99  Pulse: 72   Resp: 16   Temp: 97.8 F (36.6 C)   SpO2: 98%   Weight: 171 lb 11.2 oz (77.9 kg)   Height: 5' 2 (1.575 m)    Body mass index is 31.4 kg/m. Physical Exam  Labs reviewed: Recent Labs    03/05/23 0000 10/15/23 0000  NA 140 141  K 4.2 4.2  CL 105 106  CO2 27* 27*  BUN 24* 24*  CREATININE 0.6 0.6  CALCIUM  8.8 8.9   Recent Labs    03/05/23 0000 10/15/23 0000  AST 14 13  ALT 16 13  ALKPHOS 71 54  ALBUMIN 3.4* 3.5   Recent Labs    03/05/23 0000 10/15/23 0000  WBC 7.4 6.9  NEUTROABS 4,151.00 3,436.00  HGB 12.2 11.2*  HCT 37 34*  PLT 219 221   Lab Results  Component Value Date   TSH 3.20 10/31/2022   Lab Results  Component Value Date   HGBA1C 5.3 05/02/2013   Lab Results  Component Value Date   CHOL 188 07/10/2019   HDL 75 07/10/2019   LDLCALC 94 07/10/2019   TRIG 96 07/10/2019   CHOLHDL 2.5 07/10/2019    Significant Diagnostic Results in last 30 days:  No results found.  Assessment/Plan 1. Cellulitis of left leg (Primary) *** - doxycycline   (VIBRA -TABS) 100 MG tablet; Take 1 tablet (100 mg total) by mouth 2 (two) times daily for 7 days.    Family/ staff Communication: ***  Labs/tests ordered:  ***

## 2023-11-05 NOTE — Progress Notes (Unsigned)
 Location:  [facility] Room Number: N14-A [name]:  SNF [phone]) Provider:  Greig FORBES Cluster, NP   Charlanne Fredia CROME, MD  Patient Care Team: Charlanne Fredia CROME, MD as PCP - General (Internal Medicine) Court Dorn PARAS, MD as PCP - Cardiology (Cardiology) Celestia Agent, MD (Inactive) as Consulting Physician (Gastroenterology) Nicholaus Tanda CROME DOUGLAS, MD as Attending Physician (Urology) Court Dorn PARAS, MD as Consulting Physician (Cardiology) Ethyl Lonni FORBES, MD (Inactive) as Consulting Physician (Otolaryngology) Clare Senior, DDS (Dentistry) Rosan Credit, MD as Consulting Physician (Ophthalmology) Shari Easter, MD as Consulting Physician (Orthopedic Surgery) Tanda Locus, MD as Consulting Physician (General Surgery)  Extended Emergency Contact Information Primary Emergency Contact: Broz,Don Address: [address] [place] [address] [place] Home Phone: [phone] Mobile Phone: [phone] Relation: Son Secondary Emergency Contact: Bertrum CHRISTELLA Gibson Address: [address]          [place], [place] [address] [place] Home Phone: [phone] Work Phone: [phone] Mobile Phone: [phone] Relation: Daughter  Code Status:  DNR Goals of care: Advanced Directive information    10/17/2023    2:47 PM  Advanced Directives  Does Patient Have a Medical Advance Directive? Yes  Type of Estate agent of [place];Out of facility DNR (pink MOST or yellow form);Living will  Does patient want to make changes to medical advance directive? No - Patient declined  Copy of Healthcare Power of Attorney in Chart? Yes - validated most recent copy scanned in chart (See row information)  Pre-existing out of facility DNR order (yellow form or pink MOST form) Pink MOST/Yellow Form most recent copy in chart - Physician notified to receive inpatient order     No chief complaint on file.   HPI:   Pt is a 89 y.o. female seen today for acute visit due to left leg infection.   She currently resides on the skilled nursing unit at [facility]. PMH: hypertension, hyperlipidemia, macular degeneration, mild cognitive impairment, recurrent UTI, GERD, H/o TIA.   Followed by wound nurse at [facility]. 05/13 she developed a skin tear to left shin during transfer. Wound was initially stabilized with steri strips and then covered by foam dressing. Today, wound has increased tenderness, erythema and slough/purulent drainage in wound bed. No recent injury. Afebrile. Vitals stable.   06/04 started on prednisone taper and duonebs for acute cough. She denies cough, shortness of breath or wheezing today.   Forgetful. Dependent with ADLs. No agitation. MMSE 28/30 08/2022, recent BIMS 8/15 (05/23)> was 8/15 (02/25).  Past Medical History:  Diagnosis Date   Cervicalgia    Chest pain at rest 01/07/2012   Displacement of cervical intervertebral disc without myelopathy    Diverticulosis of colon (without mention of hemorrhage)    Female stress incontinence    GERD (gastroesophageal reflux disease)    no peds occasional pepcid   Inguinal hernia without mention of obstruction or gangrene, unilateral or unspecified, (not specified as recurrent)    Internal hemorrhoids without mention of complication    Lumbago    Macular degeneration (senile) of retina, unspecified    Osteoarthrosis, unspecified whether generalized or localized, unspecified site    Other and unspecified hyperlipidemia    Pain in joint, hand    Palpitations    Reflux esophagitis    Scoliosis (and kyphoscoliosis), idiopathic    Senile osteoporosis    Skin disorder    Thyroid  disease  TIA (transient ischemic attack)    Unruptured popliteal cyst 06/24/2014   Right knee    Unspecified essential hypertension    Unspecified glaucoma(365.9)    Unspecified hypothyroidism    Unspecified vitamin D  deficiency    Past Surgical History:   Procedure Laterality Date   ABDOMINAL HYSTERECTOMY  1975   Dr Sharron   APPENDECTOMY  1988   cardiolite myocardial perfusion study     DOPPLER ECHOCARDIOGRAPHY     EYE SURGERY Bilateral 2009   cataract removed right eye, Dr Carrie   FOOT SURGERY  august 2013   Hewitt, MD   INGUINAL HERNIA REPAIR Bilateral    w/mesh   INGUINAL HERNIA REPAIR Left 08/03/2017   Procedure: LAPAROSCOPIC LEFT INGUINAL HERNIA REPAIR WITH MESH;  Surgeon: Tanda Locus, MD;  Location: [facility] OR;  Service: General;  Laterality: Left;   INGUINAL HERNIA REPAIR Right 08/03/2017   Procedure: HERNIA REPAIR RIGHT INGUINAL ADULT WITH MESH;  Surgeon: Tanda Locus, MD;  Location: [facility] OR;  Service: General;  Laterality: Right;   INGUINAL HERNIA REPAIR     Dr. Tanda 04-24-18   INGUINAL HERNIA REPAIR Right 04/24/2018   Procedure: DIAGNOSTIC LAPAROSCOPIC, OPENREPAIR OF RECURRENT RIGHT INGUINAL HERNIA WITH MESH ERAS PATHWAY;  Surgeon: Tanda Locus, MD;  Location: [facility] ORS;  Service: General;  Laterality: Right;   INSERTION OF MESH Bilateral 08/03/2017   Procedure: INSERTION OF MESH;  Surgeon: Tanda Locus, MD;  Location: [facility] OR;  Service: General;  Laterality: Bilateral;   NM MYOVIEW  LTD     negative   TONSILLECTOMY  1937   TOOTH EXTRACTION  09/16/13   Dr Maryjean    Allergies  Allergen Reactions   Trimethoprim Other (See Comments)    Headaches/ ear pressure    Erythromycin Other (See Comments)    UNSPECIFIED REACTION    Macrodantin [Nitrofurantoin Macrocrystal] Other (See Comments)    UNSPECIFIED REACTION    Other     Honeydew Melon   Penicillins Other (See Comments)    UNSPECIFIED REACTION  Has patient had a PCN reaction causing immediate rash, facial/tongue/throat swelling, SOB or lightheadedness with hypotension: Unknown Has patient had a PCN reaction causing severe rash involving mucus membranes or skin necrosis: Unknown Has patient had a PCN reaction that required hospitalization: No Has patient had a PCN reaction  occurring within the last 10 years: No If all of the above answers are NO, then may proceed with Cephalosporin use.   Sulfa Antibiotics Other (See Comments)    UNSPECIFIED REACTION    Latex Itching    Outpatient Encounter Medications as of 11/05/2023  Medication Sig   doxycycline  (VIBRA -TABS) 100 MG tablet Take 1 tablet (100 mg total) by mouth 2 (two) times daily for 7 days.   acetaminophen  (TYLENOL ) 500 MG tablet Take 500 mg by mouth 2 (two) times daily as needed for mild pain or headache.    albuterol  (VENTOLIN  HFA) 108 (90 Base) MCG/ACT inhaler Inhale 2 puffs into the lungs every 6 (six) hours as needed for wheezing or shortness of breath.   cephALEXin  (KEFLEX ) 250 MG capsule Take 250 mg by mouth daily.   Cholecalciferol  (VITAMIN D3) 50 MCG (2000 UT) TABS Take by mouth.   clopidogrel  (PLAVIX ) 75 MG tablet TAKE ONE TABLET BY MOUTH DAILY   Cranberry-Vitamin C-Probiotic (AZO CRANBERRY PO) Take 1 tablet by mouth daily.   Menthol, Topical Analgesic, (BIOFREEZE ROLL-ON) 4 % GEL Apply 1 Application topically in the morning and at bedtime. Apply to right upper arm   Menthol,  Topical Analgesic, (BIOFREEZE ROLL-ON) 4 % GEL Apply 1 application  topically 2 (two) times daily. Apply to right knee   metoprolol  succinate (TOPROL -XL) 25 MG 24 hr tablet Take one tablet by mouth once daily to regulate heart and control blood pressure.   Multiple Vitamins-Minerals (PRESERVISION AREDS 2 PO) Take 1 capsule by mouth 2 (two) times daily.    vitamin C (ASCORBIC ACID) 500 MG tablet Take 500 mg by mouth daily with supper.    Zinc Oxide 10 % OINT Apply 1 Application topically as needed.   No facility-administered encounter medications on file as of 11/05/2023.    Review of Systems  Constitutional:  Negative for fatigue and fever.  Respiratory:  Negative for cough and shortness of breath.   Cardiovascular:  Negative for chest pain and leg swelling.  Musculoskeletal:  Positive for arthralgias and gait problem.   Skin:  Positive for color change and wound.  Psychiatric/Behavioral:  Positive for confusion and dysphoric mood. Negative for sleep disturbance. The patient is not nervous/anxious.     Immunization History  Administered Date(s) Administered   Fluad Quad(high Dose 65+) 02/04/2019, 03/01/2022   Influenza Whole 02/24/2011, 02/12/2012   Influenza, High Dose Seasonal PF 02/01/2017, 02/25/2018, 02/11/2020, 03/13/2023   Influenza,inj,Quad PF,6+ Mos 02/20/2013, 02/10/2014, 02/19/2015, 02/18/2016   Influenza-Unspecified 02/16/2021   Moderna Covid-19 Fall Seasonal Vaccine 24yrs & older 02/09/2022, 03/21/2023   Moderna Sars-Covid-2 Vaccination 05/19/2019, 06/16/2019, 03/29/2020, 09/27/2020   Pneumococcal Conjugate-13 06/24/2014   Pneumococcal Polysaccharide-23 04/12/1998   Td 02/21/1996, 07/23/2003   Tdap 03/20/2010   Zoster Recombinant(Shingrix) 09/19/2016, 12/02/2016   Zoster, Live 09/15/2005   Pertinent  Health Maintenance Due  Topic Date Due   INFLUENZA VACCINE  12/14/2023   DEXA SCAN  Completed      09/06/2022    2:27 PM 12/27/2022    2:15 PM 04/18/2023    4:49 PM 08/27/2023    2:51 PM 09/14/2023   12:18 PM  Fall Risk  Falls in the past year? 1 0 0 1 1  Was there an injury with Fall? 1 0 0 0 0  Fall Risk Category Calculator 2 0 0 1 1  Patient at Risk for Falls Due to History of fall(s);Impaired balance/gait;Impaired mobility No Fall Risks History of fall(s);Impaired balance/gait;Impaired mobility History of fall(s);Impaired mobility History of fall(s);Impaired balance/gait  Fall risk Follow up Falls evaluation completed;Education provided;Falls prevention discussed Falls evaluation completed;Education provided;Falls prevention discussed Falls evaluation completed;Education provided;Falls prevention discussed Falls evaluation completed;Education provided Falls evaluation completed;Education provided   Functional Status Survey:    There were no vitals filed for this visit. There is no  height or weight on file to calculate BMI. Physical Exam Vitals reviewed.  Constitutional:      General: She is not in acute distress. HENT:     Head: Normocephalic.   Eyes:     General:        Right eye: No discharge.        Left eye: No discharge.    Cardiovascular:     Rate and Rhythm: Normal rate and regular rhythm.     Pulses: Normal pulses.     Heart sounds: Normal heart sounds.  Pulmonary:     Effort: Pulmonary effort is normal. No respiratory distress.     Breath sounds: Normal breath sounds. No wheezing, rhonchi or rales.  Abdominal:     Palpations: Abdomen is soft.   Musculoskeletal:     Cervical back: Neck supple.     Right lower leg: No  edema.     Left lower leg: No edema.   Skin:    General: Skin is warm.     Capillary Refill: Capillary refill takes less than 2 seconds.     Findings: Lesion present.     Comments: Approx 1 cm open lesion to left shin, wound bed with no granulation tissue, slough/purulent drainage present, mild erythema/swelling/tenderness surrounding wound.    Neurological:     General: No focal deficit present.     Mental Status: She is alert.     Gait: Gait abnormal.   Psychiatric:        Mood and Affect: Mood normal.     Labs reviewed: Recent Labs    03/05/23 0000  NA 140  K 4.2  CL 105  CO2 27*  BUN 24*  CREATININE 0.6  CALCIUM  8.8   Recent Labs    03/05/23 0000  AST 14  ALT 16  ALKPHOS 71  ALBUMIN 3.4*   Recent Labs    03/05/23 0000  WBC 7.4  NEUTROABS 4,151.00  HGB 12.2  HCT 37  PLT 219   Lab Results  Component Value Date   TSH 3.20 10/31/2022   Lab Results  Component Value Date   HGBA1C 5.3 05/02/2013   Lab Results  Component Value Date   CHOL 188 07/10/2019   HDL 75 07/10/2019   LDLCALC 94 07/10/2019   TRIG 96 07/10/2019   CHOLHDL 2.5 07/10/2019    Significant Diagnostic Results in last 30 days:  No results found.  Assessment/Plan 1. Cellulitis of left leg (Primary) - DOI 05/13> skin  tear while transferring> steri strips applied - now slow healing, slough/purulent drainage in wound bed, mild erythema/swelling/tenderness surrounding site - start Santyl applications daily x 7 days - will start doxycycline   - doxycycline  (VIBRA -TABS) 100 MG tablet; Take 1 tablet (100 mg total) by mouth 2 (two) times daily for 7 days.  2. Acute cough - 06/04 prednisone taper and duonebs started  - 06/02 BNP 96  - 05/31 CXR unremarkable, no infiltrates or effusion - lung sound clear, cough subsided per patient - cont duonebs prn  3. Cognitive impairment - MMSE 28/30 - recent BIMS 8/15 10/05/2023 - no behaviors - dependent with ADLs except feeding  - weight stable - unstable gait - not on medication  - cont skilled nursing     Family/ staff Communication: plan discussed with patient and nurse  Labs/tests ordered:  none

## 2023-11-06 DIAGNOSIS — R262 Difficulty in walking, not elsewhere classified: Secondary | ICD-10-CM | POA: Diagnosis not present

## 2023-11-06 DIAGNOSIS — R278 Other lack of coordination: Secondary | ICD-10-CM | POA: Diagnosis not present

## 2023-11-06 DIAGNOSIS — M6281 Muscle weakness (generalized): Secondary | ICD-10-CM | POA: Diagnosis not present

## 2023-11-06 NOTE — Progress Notes (Signed)
 Location:  [facility] Room Number: N14-A [name]:  SNF [phone]) Provider:  Greig FORBES Cluster, NP   Charlanne Fredia CROME, MD  Patient Care Team: Charlanne Fredia CROME, MD as PCP - General (Internal Medicine) Court Dorn PARAS, MD as PCP - Cardiology (Cardiology) Celestia Agent, MD (Inactive) as Consulting Physician (Gastroenterology) Nicholaus Tanda CROME DOUGLAS, MD as Attending Physician (Urology) Court Dorn PARAS, MD as Consulting Physician (Cardiology) Ethyl Lonni FORBES, MD (Inactive) as Consulting Physician (Otolaryngology) Clare Senior, DDS (Dentistry) Rosan Credit, MD as Consulting Physician (Ophthalmology) Shari Easter, MD as Consulting Physician (Orthopedic Surgery) Tanda Locus, MD as Consulting Physician (General Surgery)  Extended Emergency Contact Information Primary Emergency Contact: Fabio,Don Address: [address] [place] [address] [place] Home Phone: [phone] Mobile Phone: [phone] Relation: Son Secondary Emergency Contact: Bertrum CHRISTELLA Gibson Address: [address]          [place], [place] [address] [place] Home Phone: [phone] Work Phone: [phone] Mobile Phone: [phone] Relation: Daughter  Code Status:  DNR Goals of care: Advanced Directive information    11/05/2023    4:28 PM  Advanced Directives  Does Patient Have a Medical Advance Directive? Yes  Type of Estate agent of [place];Out of facility DNR (pink MOST or yellow form);Living will  Does patient want to make changes to medical advance directive? No - Patient declined  Copy of Healthcare Power of Attorney in Chart? Yes - validated most recent copy scanned in chart (See row information)  Pre-existing out of facility DNR order (yellow form or pink MOST form) Pink MOST/Yellow Form most recent copy in chart - Physician notified to receive inpatient order     Chief Complaint  Patient presents  with   Acute Visit    Left shin cellulitis    HPI:  Pt is a 89 y.o. female seen today for acute visit due to left leg infection.   Pt is a 89 y.o. female seen today for acute visit due to left leg infection.    She currently resides on the skilled nursing unit at [facility]. PMH: hypertension, hyperlipidemia, macular degeneration, mild cognitive impairment, recurrent UTI, GERD, H/o TIA.    Followed by wound nurse at [facility]. 05/13 she developed a skin tear to left shin during transfer. Wound was initially stabilized with steri strips and then covered by foam dressing. Today, wound has increased tenderness, erythema and slough/purulent drainage in wound bed. No recent injury. Afebrile. Vitals stable.    06/04 started on prednisone taper and duonebs for acute cough. She denies cough, shortness of breath or wheezing today.    Forgetful. Dependent with ADLs. No agitation. MMSE 28/30 08/2022, recent BIMS 8/15 (05/23)> was 8/15 (02/25).   Past Medical History:  Diagnosis Date   Cervicalgia    Chest pain at rest 01/07/2012   Displacement of cervical intervertebral disc without myelopathy    Diverticulosis of colon (without mention of hemorrhage)    Female stress incontinence    GERD (gastroesophageal reflux disease)    no peds occasional pepcid   Inguinal hernia without mention of obstruction or gangrene, unilateral or unspecified, (not specified as recurrent)    Internal hemorrhoids without mention of complication    Lumbago    Macular degeneration (senile) of retina, unspecified    Osteoarthrosis, unspecified whether generalized or localized, unspecified site    Other and unspecified hyperlipidemia    Pain in joint,  hand    Palpitations    Reflux esophagitis    Scoliosis (and kyphoscoliosis), idiopathic    Senile osteoporosis    Skin disorder    Thyroid  disease    TIA (transient ischemic attack)    Unruptured popliteal cyst 06/24/2014   Right knee    Unspecified essential  hypertension    Unspecified glaucoma(365.9)    Unspecified hypothyroidism    Unspecified vitamin D  deficiency    Past Surgical History:  Procedure Laterality Date   ABDOMINAL HYSTERECTOMY  1975   Dr Sharron   APPENDECTOMY  1988   cardiolite myocardial perfusion study     DOPPLER ECHOCARDIOGRAPHY     EYE SURGERY Bilateral 2009   cataract removed right eye, Dr Carrie   FOOT SURGERY  august 2013   Hewitt, MD   INGUINAL HERNIA REPAIR Bilateral    w/mesh   INGUINAL HERNIA REPAIR Left 08/03/2017   Procedure: LAPAROSCOPIC LEFT INGUINAL HERNIA REPAIR WITH MESH;  Surgeon: Tanda Locus, MD;  Location: [facility] OR;  Service: General;  Laterality: Left;   INGUINAL HERNIA REPAIR Right 08/03/2017   Procedure: HERNIA REPAIR RIGHT INGUINAL ADULT WITH MESH;  Surgeon: Tanda Locus, MD;  Location: [facility] OR;  Service: General;  Laterality: Right;   INGUINAL HERNIA REPAIR     Dr. Tanda 04-24-18   INGUINAL HERNIA REPAIR Right 04/24/2018   Procedure: DIAGNOSTIC LAPAROSCOPIC, OPENREPAIR OF RECURRENT RIGHT INGUINAL HERNIA WITH MESH ERAS PATHWAY;  Surgeon: Tanda Locus, MD;  Location: [facility] ORS;  Service: General;  Laterality: Right;   INSERTION OF MESH Bilateral 08/03/2017   Procedure: INSERTION OF MESH;  Surgeon: Tanda Locus, MD;  Location: [facility] OR;  Service: General;  Laterality: Bilateral;   NM MYOVIEW  LTD     negative   TONSILLECTOMY  1937   TOOTH EXTRACTION  09/16/13   Dr Maryjean    Allergies  Allergen Reactions   Trimethoprim Other (See Comments)    Headaches/ ear pressure    Erythromycin Other (See Comments)    UNSPECIFIED REACTION    Macrodantin [Nitrofurantoin Macrocrystal] Other (See Comments)    UNSPECIFIED REACTION    Other     Honeydew Melon   Penicillins Other (See Comments)    UNSPECIFIED REACTION  Has patient had a PCN reaction causing immediate rash, facial/tongue/throat swelling, SOB or lightheadedness with hypotension: Unknown Has patient had a PCN reaction causing severe rash involving mucus  membranes or skin necrosis: Unknown Has patient had a PCN reaction that required hospitalization: No Has patient had a PCN reaction occurring within the last 10 years: No If all of the above answers are NO, then may proceed with Cephalosporin use.   Sulfa Antibiotics Other (See Comments)    UNSPECIFIED REACTION    Latex Itching    Outpatient Encounter Medications as of 11/05/2023  Medication Sig   acetaminophen  (TYLENOL ) 500 MG tablet Take 500 mg by mouth 2 (two) times daily as needed for mild pain or headache.    albuterol  (VENTOLIN  HFA) 108 (90 Base) MCG/ACT inhaler Inhale 2 puffs into the lungs every 6 (six) hours as needed for wheezing or shortness of breath.   cephALEXin  (KEFLEX ) 250 MG capsule Take 250 mg by mouth daily.   Cholecalciferol  (VITAMIN D3) 50 MCG (2000 UT) TABS Take by mouth.   clopidogrel  (PLAVIX ) 75 MG tablet TAKE ONE TABLET BY MOUTH DAILY   Cranberry-Vitamin C-Probiotic (AZO CRANBERRY PO) Take 1 tablet by mouth daily.   doxycycline  (VIBRA -TABS) 100 MG tablet Take 1 tablet (100 mg total) by mouth  2 (two) times daily for 7 days.   ipratropium-albuterol  (DUONEB) 0.5-2.5 (3) MG/3ML SOLN Inhale 3 mLs into the lungs every 12 (twelve) hours.   Menthol, Topical Analgesic, (BIOFREEZE ROLL-ON) 4 % GEL Apply 1 Application topically in the morning and at bedtime. Apply to right upper arm   Menthol, Topical Analgesic, (BIOFREEZE ROLL-ON) 4 % GEL Apply 1 application  topically 2 (two) times daily. Apply to right knee   metoprolol  succinate (TOPROL -XL) 25 MG 24 hr tablet Take one tablet by mouth once daily to regulate heart and control blood pressure.   Multiple Vitamins-Minerals (PRESERVISION AREDS 2 PO) Take 1 capsule by mouth 2 (two) times daily.    vitamin C (ASCORBIC ACID) 500 MG tablet Take 500 mg by mouth daily with supper.    Zinc Oxide 10 % OINT Apply 1 Application topically as needed.   No facility-administered encounter medications on file as of 11/05/2023.    Review of  Systems  Constitutional:  Negative for fatigue and fever.  Respiratory:  Negative for cough and shortness of breath.   Skin:  Positive for color change and wound.  Psychiatric/Behavioral:  Positive for confusion. Negative for dysphoric mood. The patient is not nervous/anxious.     Immunization History  Administered Date(s) Administered   Fluad Quad(high Dose 65+) 02/04/2019, 03/01/2022   Influenza Whole 02/24/2011, 02/12/2012   Influenza, High Dose Seasonal PF 02/01/2017, 02/25/2018, 02/11/2020, 03/13/2023   Influenza,inj,Quad PF,6+ Mos 02/20/2013, 02/10/2014, 02/19/2015, 02/18/2016   Influenza-Unspecified 02/16/2021   Moderna Covid-19 Fall Seasonal Vaccine 71yrs & older 02/09/2022, 03/21/2023   Moderna Sars-Covid-2 Vaccination 05/19/2019, 06/16/2019, 03/29/2020, 09/27/2020   Pneumococcal Conjugate-13 06/24/2014   Pneumococcal Polysaccharide-23 04/12/1998   Td 02/21/1996, 07/23/2003   Tdap 03/20/2010   Zoster Recombinant(Shingrix) 09/19/2016, 12/02/2016   Zoster, Live 09/15/2005   Pertinent  Health Maintenance Due  Topic Date Due   INFLUENZA VACCINE  12/14/2023   DEXA SCAN  Completed      09/06/2022    2:27 PM 12/27/2022    2:15 PM 04/18/2023    4:49 PM 08/27/2023    2:51 PM 09/14/2023   12:18 PM  Fall Risk  Falls in the past year? 1 0 0 1 1  Was there an injury with Fall? 1 0 0 0 0  Fall Risk Category Calculator 2 0 0 1 1  Patient at Risk for Falls Due to History of fall(s);Impaired balance/gait;Impaired mobility No Fall Risks History of fall(s);Impaired balance/gait;Impaired mobility History of fall(s);Impaired mobility History of fall(s);Impaired balance/gait  Fall risk Follow up Falls evaluation completed;Education provided;Falls prevention discussed Falls evaluation completed;Education provided;Falls prevention discussed Falls evaluation completed;Education provided;Falls prevention discussed Falls evaluation completed;Education provided Falls evaluation completed;Education  provided   Functional Status Survey:    Vitals:   11/05/23 1627 11/05/23 1628  BP: (!) 145/87 (!) 173/99  Pulse: 72   Resp: 16   Temp: 97.8 F (36.6 C)   SpO2: 98%   Weight: 171 lb 11.2 oz (77.9 kg)   Height: 5' 2 (1.575 m)    Body mass index is 31.4 kg/m. Physical Exam Vitals reviewed.  Constitutional:      General: She is not in acute distress. HENT:     Head: Normocephalic.   Eyes:     General:        Right eye: No discharge.        Left eye: No discharge.    Cardiovascular:     Rate and Rhythm: Normal rate and regular rhythm.     Pulses:  Normal pulses.     Heart sounds: Normal heart sounds.  Pulmonary:     Effort: Pulmonary effort is normal. No respiratory distress.     Breath sounds: No wheezing, rhonchi or rales.  Abdominal:     Palpations: Abdomen is soft.   Musculoskeletal:     Cervical back: Neck supple.     Right lower leg: No edema.     Left lower leg: No edema.   Skin:    General: Skin is warm.     Capillary Refill: Capillary refill takes less than 2 seconds.     Findings: Lesion present.     Comments:  Approx 1 cm open lesion to left shin, wound bed with no granulation tissue, slough/purulent drainage present, mild erythema/swelling/tenderness surrounding wound   Neurological:     General: No focal deficit present.     Mental Status: She is alert.     Gait: Gait abnormal.   Psychiatric:        Mood and Affect: Mood normal.     Labs reviewed: Recent Labs    03/05/23 0000 10/15/23 0000  NA 140 141  K 4.2 4.2  CL 105 106  CO2 27* 27*  BUN 24* 24*  CREATININE 0.6 0.6  CALCIUM  8.8 8.9   Recent Labs    03/05/23 0000 10/15/23 0000  AST 14 13  ALT 16 13  ALKPHOS 71 54  ALBUMIN 3.4* 3.5   Recent Labs    03/05/23 0000 10/15/23 0000  WBC 7.4 6.9  NEUTROABS 4,151.00 3,436.00  HGB 12.2 11.2*  HCT 37 34*  PLT 219 221   Lab Results  Component Value Date   TSH 3.20 10/31/2022   Lab Results  Component Value Date   HGBA1C  5.3 05/02/2013   Lab Results  Component Value Date   CHOL 188 07/10/2019   HDL 75 07/10/2019   LDLCALC 94 07/10/2019   TRIG 96 07/10/2019   CHOLHDL 2.5 07/10/2019    Significant Diagnostic Results in last 30 days:  No results found.  Assessment/Plan ent/Plan 1. Cellulitis of left leg (Primary) - DOI 05/13> skin tear while transferring> steri strips applied - now slow healing, slough/purulent drainage in wound bed, mild erythema/swelling/tenderness surrounding site - start Santyl applications daily x 7 days - will start doxycycline   - doxycycline  (VIBRA -TABS) 100 MG tablet; Take 1 tablet (100 mg total) by mouth 2 (two) times daily for 7 days.   2. Acute cough - 06/04 prednisone taper and duonebs started  - 06/02 BNP 96  - 05/31 CXR unremarkable, no infiltrates or effusion - lung sound clear, cough subsided per patient - cont duonebs prn   3. Cognitive impairment - MMSE 28/30 - recent BIMS 8/15 10/05/2023 - no behaviors - dependent with ADLs except feeding  - weight stable - unstable gait - not on medication  - cont skilled nursing      Family/ staff Communication: plan discussed with patient and nurse  Labs/tests ordered:  none

## 2023-11-07 DIAGNOSIS — R278 Other lack of coordination: Secondary | ICD-10-CM | POA: Diagnosis not present

## 2023-11-07 DIAGNOSIS — M6281 Muscle weakness (generalized): Secondary | ICD-10-CM | POA: Diagnosis not present

## 2023-11-07 DIAGNOSIS — R262 Difficulty in walking, not elsewhere classified: Secondary | ICD-10-CM | POA: Diagnosis not present

## 2023-11-08 DIAGNOSIS — R278 Other lack of coordination: Secondary | ICD-10-CM | POA: Diagnosis not present

## 2023-11-08 DIAGNOSIS — R262 Difficulty in walking, not elsewhere classified: Secondary | ICD-10-CM | POA: Diagnosis not present

## 2023-11-08 DIAGNOSIS — M6281 Muscle weakness (generalized): Secondary | ICD-10-CM | POA: Diagnosis not present

## 2023-11-09 DIAGNOSIS — H353221 Exudative age-related macular degeneration, left eye, with active choroidal neovascularization: Secondary | ICD-10-CM | POA: Diagnosis not present

## 2023-11-10 DIAGNOSIS — M6281 Muscle weakness (generalized): Secondary | ICD-10-CM | POA: Diagnosis not present

## 2023-11-10 DIAGNOSIS — R278 Other lack of coordination: Secondary | ICD-10-CM | POA: Diagnosis not present

## 2023-11-10 DIAGNOSIS — R262 Difficulty in walking, not elsewhere classified: Secondary | ICD-10-CM | POA: Diagnosis not present

## 2023-11-12 DIAGNOSIS — M6281 Muscle weakness (generalized): Secondary | ICD-10-CM | POA: Diagnosis not present

## 2023-11-12 DIAGNOSIS — R278 Other lack of coordination: Secondary | ICD-10-CM | POA: Diagnosis not present

## 2023-11-12 DIAGNOSIS — R262 Difficulty in walking, not elsewhere classified: Secondary | ICD-10-CM | POA: Diagnosis not present

## 2023-11-14 DIAGNOSIS — M6281 Muscle weakness (generalized): Secondary | ICD-10-CM | POA: Diagnosis not present

## 2023-11-14 DIAGNOSIS — R278 Other lack of coordination: Secondary | ICD-10-CM | POA: Diagnosis not present

## 2023-11-14 DIAGNOSIS — R262 Difficulty in walking, not elsewhere classified: Secondary | ICD-10-CM | POA: Diagnosis not present

## 2023-11-15 ENCOUNTER — Non-Acute Institutional Stay (SKILLED_NURSING_FACILITY): Payer: Self-pay | Admitting: Internal Medicine

## 2023-11-15 ENCOUNTER — Encounter: Payer: Self-pay | Admitting: Internal Medicine

## 2023-11-15 DIAGNOSIS — R052 Subacute cough: Secondary | ICD-10-CM

## 2023-11-15 DIAGNOSIS — R600 Localized edema: Secondary | ICD-10-CM

## 2023-11-15 DIAGNOSIS — I10 Essential (primary) hypertension: Secondary | ICD-10-CM

## 2023-11-15 DIAGNOSIS — Z66 Do not resuscitate: Secondary | ICD-10-CM

## 2023-11-15 MED ORDER — POTASSIUM CHLORIDE CRYS ER 10 MEQ PO TBCR: 10.0000 meq | EXTENDED_RELEASE_TABLET | Freq: Once | ORAL | Status: DC

## 2023-11-15 MED ORDER — FUROSEMIDE 20 MG PO TABS
20.0000 mg | ORAL_TABLET | Freq: Every day | ORAL | Status: DC
Start: 2023-11-15 — End: 2023-11-29

## 2023-11-15 NOTE — Progress Notes (Signed)
 Location:  [facility] Room Number: N14-A [name]:  SNF [phone]) Provider:  Charlanne Fredia CROME, MD  Patient Care Team: Charlanne Fredia CROME, MD as PCP - General (Internal Medicine) Court Dorn PARAS, MD as PCP - Cardiology (Cardiology) Celestia Agent, MD (Inactive) as Consulting Physician (Gastroenterology) Nicholaus Tanda CROME DOUGLAS, MD as Attending Physician (Urology) Court Dorn PARAS, MD as Consulting Physician (Cardiology) Ethyl Lonni BRAVO, MD (Inactive) as Consulting Physician (Otolaryngology) Clare Senior, DDS (Dentistry) Rosan Credit, MD as Consulting Physician (Ophthalmology) Shari Easter, MD as Consulting Physician (Orthopedic Surgery) Tanda Locus, MD as Consulting Physician (General Surgery)  Extended Emergency Contact Information Primary Emergency Contact: Wrench,Don Address: [address] [address] [place] Home Phone: [phone] Mobile Phone: [phone] Relation: Son Secondary Emergency Contact: Bertrum CHRISTELLA Gibson Address: [address]          [place], [place] [address] [place] Home Phone: [phone] Work Phone: [phone] Mobile Phone: [phone] Relation: Daughter  Code Status:  DNR Goals of care: Advanced Directive information    11/15/2023    3:36 PM  Advanced Directives  Does Patient Have a Medical Advance Directive? Yes  Type of Estate agent of [place];Out of facility DNR (pink MOST or yellow form);Living will  Copy of Healthcare Power of Attorney in Chart? Yes - validated most recent copy scanned in chart (See row information)  Pre-existing out of facility DNR order (yellow form or pink MOST form) Pink MOST/Yellow Form most recent copy in chart - Physician notified to receive inpatient order     Chief Complaint  Patient presents with   Acute Visit    HPI:  Pt is a 89 y.o. female seen today for an acute visit for Cough SOB and  Wheezing  Lives in SNF in [facility]   Patient has a history of hypertension, hyperlipidemia, macular degeneration, mild cognitive impairment, recurrent UTI, GERD, H/o TIA    Has been having Off and On Cough for past month Treated with Doxy and Prednisone Chest Xra negative BNP 96 But she has now gained weight  Wt Readings from Last 3 Encounters:  11/15/23 175 lb 1.6 oz (79.4 kg)  11/05/23 171 lb 11.2 oz (77.9 kg)  10/17/23 171 lb 11.2 oz (77.9 kg)   And has LE edema    Past Medical History:  Diagnosis Date   Cervicalgia    Chest pain at rest 01/07/2012   Displacement of cervical intervertebral disc without myelopathy    Diverticulosis of colon (without mention of hemorrhage)    Female stress incontinence    GERD (gastroesophageal reflux disease)    no peds occasional pepcid   Inguinal hernia without mention of obstruction or gangrene, unilateral or unspecified, (not specified as recurrent)    Internal hemorrhoids without mention of complication    Lumbago    Macular degeneration (senile) of retina, unspecified    Osteoarthrosis, unspecified whether generalized or localized, unspecified site    Other and unspecified hyperlipidemia    Pain in joint, hand    Palpitations    Reflux esophagitis    Scoliosis (and kyphoscoliosis), idiopathic    Senile osteoporosis    Skin disorder    Thyroid  disease    TIA (transient ischemic attack)    Unruptured popliteal cyst 06/24/2014   Right knee    Unspecified essential hypertension    Unspecified glaucoma(365.9)    Unspecified hypothyroidism    Unspecified vitamin D   deficiency    Past Surgical History:  Procedure Laterality Date   ABDOMINAL HYSTERECTOMY  1975   Dr Sharron   APPENDECTOMY  1988   cardiolite myocardial perfusion study     DOPPLER ECHOCARDIOGRAPHY     EYE SURGERY Bilateral 2009   cataract removed right eye, Dr Carrie   FOOT SURGERY  august 2013   Hewitt, MD   INGUINAL HERNIA REPAIR Bilateral    w/mesh   INGUINAL HERNIA  REPAIR Left 08/03/2017   Procedure: LAPAROSCOPIC LEFT INGUINAL HERNIA REPAIR WITH MESH;  Surgeon: Tanda Locus, MD;  Location: [facility] OR;  Service: General;  Laterality: Left;   INGUINAL HERNIA REPAIR Right 08/03/2017   Procedure: HERNIA REPAIR RIGHT INGUINAL ADULT WITH MESH;  Surgeon: Tanda Locus, MD;  Location: [facility] OR;  Service: General;  Laterality: Right;   INGUINAL HERNIA REPAIR     Dr. Tanda 04-24-18   INGUINAL HERNIA REPAIR Right 04/24/2018   Procedure: DIAGNOSTIC LAPAROSCOPIC, OPENREPAIR OF RECURRENT RIGHT INGUINAL HERNIA WITH MESH ERAS PATHWAY;  Surgeon: Tanda Locus, MD;  Location: [facility] ORS;  Service: General;  Laterality: Right;   INSERTION OF MESH Bilateral 08/03/2017   Procedure: INSERTION OF MESH;  Surgeon: Tanda Locus, MD;  Location[facility] OR;  Service: General;  Laterality: Bilateral;   NM MYOVIEW  LTD     negative   TONSILLECTOMY  1937   TOOTH EXTRACTION  09/16/13   Dr Maryjean    Allergies  Allergen Reactions   Trimethoprim Other (See Comments)    Headaches/ ear pressure    Erythromycin Other (See Comments)    UNSPECIFIED REACTION    Macrodantin [Nitrofurantoin Macrocrystal] Other (See Comments)    UNSPECIFIED REACTION    Other     Honeydew Melon   Penicillins Other (See Comments)    UNSPECIFIED REACTION  Has patient had a PCN reaction causing immediate rash, facial/tongue/throat swelling, SOB or lightheadedness with hypotension: Unknown Has patient had a PCN reaction causing severe rash involving mucus membranes or skin necrosis: Unknown Has patient had a PCN reaction that required hospitalization: No Has patient had a PCN reaction occurring within the last 10 years: No If all of the above answers are NO, then may proceed with Cephalosporin use.   Sulfa Antibiotics Other (See Comments)    UNSPECIFIED REACTION    Latex Itching    Outpatient Encounter Medications as of 11/15/2023  Medication Sig   acetaminophen  (TYLENOL ) 500 MG tablet Take 500 mg by mouth 2 (two) times  daily as needed for mild pain or headache.    albuterol  (VENTOLIN  HFA) 108 (90 Base) MCG/ACT inhaler Inhale 2 puffs into the lungs every 6 (six) hours as needed for wheezing or shortness of breath.   cephALEXin  (KEFLEX ) 250 MG capsule Take 250 mg by mouth daily.   Cholecalciferol  (VITAMIN D3) 50 MCG (2000 UT) TABS Take by mouth.   clopidogrel  (PLAVIX ) 75 MG tablet TAKE ONE TABLET BY MOUTH DAILY   collagenase (SANTYL) 250 UNIT/GM ointment Apply 1 Application topically daily.   Cranberry-Vitamin C-Probiotic (AZO CRANBERRY PO) Take 1 tablet by mouth daily.   ipratropium-albuterol  (DUONEB) 0.5-2.5 (3) MG/3ML SOLN Inhale 3 mLs into the lungs every 12 (twelve) hours.   Menthol, Topical Analgesic, (BIOFREEZE ROLL-ON) 4 % GEL Apply 1 Application topically in the morning and at bedtime. Apply to right upper arm   Menthol, Topical Analgesic, (BIOFREEZE ROLL-ON) 4 % GEL Apply 1 application  topically 2 (two) times daily. Apply to right knee   metoprolol  succinate (TOPROL -XL) 25 MG 24 hr  tablet Take one tablet by mouth once daily to regulate heart and control blood pressure.   Multiple Vitamins-Minerals (PRESERVISION AREDS 2 PO) Take 1 capsule by mouth 2 (two) times daily.    vitamin C (ASCORBIC ACID) 500 MG tablet Take 500 mg by mouth daily with supper.    Zinc Oxide 10 % OINT Apply 1 Application topically as needed.   No facility-administered encounter medications on file as of 11/15/2023.    Review of Systems  Constitutional:  Negative for activity change and appetite change.  HENT: Negative.    Respiratory:  Positive for cough and shortness of breath.   Cardiovascular:  Positive for leg swelling.  Gastrointestinal:  Negative for constipation.  Genitourinary: Negative.   Musculoskeletal:  Positive for gait problem. Negative for arthralgias and myalgias.  Skin: Negative.   Neurological:  Negative for dizziness and weakness.  Psychiatric/Behavioral:  Positive for confusion. Negative for dysphoric mood  and sleep disturbance.     Immunization History  Administered Date(s) Administered   Fluad Quad(high Dose 65+) 02/04/2019, 03/01/2022   Influenza Whole 02/24/2011, 02/12/2012   Influenza, High Dose Seasonal PF 02/01/2017, 02/25/2018, 02/11/2020, 03/13/2023   Influenza,inj,Quad PF,6+ Mos 02/20/2013, 02/10/2014, 02/19/2015, 02/18/2016   Influenza-Unspecified 02/16/2021   Moderna Covid-19 Fall Seasonal Vaccine 46yrs & older 02/09/2022, 03/21/2023   Moderna Sars-Covid-2 Vaccination 05/19/2019, 06/16/2019, 03/29/2020, 09/27/2020   Pneumococcal Conjugate-13 06/24/2014   Pneumococcal Polysaccharide-23 04/12/1998   Td 02/21/1996, 07/23/2003   Tdap 03/20/2010   Zoster Recombinant(Shingrix) 09/19/2016, 12/02/2016   Zoster, Live 09/15/2005   Pertinent  Health Maintenance Due  Topic Date Due   INFLUENZA VACCINE  12/14/2023   DEXA SCAN  Completed      09/06/2022    2:27 PM 12/27/2022    2:15 PM 04/18/2023    4:49 PM 08/27/2023    2:51 PM 09/14/2023   12:18 PM  Fall Risk  Falls in the past year? 1 0 0 1 1  Was there an injury with Fall? 1 0 0 0 0  Fall Risk Category Calculator 2 0 0 1 1  Patient at Risk for Falls Due to History of fall(s);Impaired balance/gait;Impaired mobility No Fall Risks History of fall(s);Impaired balance/gait;Impaired mobility History of fall(s);Impaired mobility History of fall(s);Impaired balance/gait  Fall risk Follow up Falls evaluation completed;Education provided;Falls prevention discussed Falls evaluation completed;Education provided;Falls prevention discussed Falls evaluation completed;Education provided;Falls prevention discussed Falls evaluation completed;Education provided Falls evaluation completed;Education provided   Functional Status Survey:    Vitals:   11/15/23 1526 11/15/23 1527  BP: (!) 157/80 (!) 158/80  Pulse: 83   Resp: 20   Temp: (!) 97 F (36.1 C)   SpO2: 94%   Weight: 175 lb 1.6 oz (79.4 kg)   Height: 5' 2 (1.575 m)    Body mass index  is 32.03 kg/m. Physical Exam Vitals reviewed.  Constitutional:      Appearance: Normal appearance.  HENT:     Head: Normocephalic.     Nose: Nose normal.     Mouth/Throat:     Mouth: Mucous membranes are moist.     Pharynx: Oropharynx is clear.  Eyes:     Pupils: Pupils are equal, round, and reactive to light.  Cardiovascular:     Rate and Rhythm: Normal rate and regular rhythm.     Pulses: Normal pulses.     Heart sounds: Normal heart sounds. No murmur heard. Pulmonary:     Effort: Pulmonary effort is normal.     Breath sounds: Rales present.  Abdominal:  General: Abdomen is flat. Bowel sounds are normal.     Palpations: Abdomen is soft.  Musculoskeletal:        General: Swelling present.     Cervical back: Neck supple.  Skin:    General: Skin is warm.  Neurological:     General: No focal deficit present.     Mental Status: She is alert.  Psychiatric:        Mood and Affect: Mood normal.        Thought Content: Thought content normal.     Labs reviewed: Recent Labs    03/05/23 0000 10/15/23 0000  NA 140 141  K 4.2 4.2  CL 105 106  CO2 27* 27*  BUN 24* 24*  CREATININE 0.6 0.6  CALCIUM  8.8 8.9   Recent Labs    03/05/23 0000 10/15/23 0000  AST 14 13  ALT 16 13  ALKPHOS 71 54  ALBUMIN 3.4* 3.5   Recent Labs    03/05/23 0000 10/15/23 0000  WBC 7.4 6.9  NEUTROABS 4,151.00 3,436.00  HGB 12.2 11.2*  HCT 37 34*  PLT 219 221   Lab Results  Component Value Date   TSH 3.20 10/31/2022   Lab Results  Component Value Date   HGBA1C 5.3 05/02/2013   Lab Results  Component Value Date   CHOL 188 07/10/2019   HDL 75 07/10/2019   LDLCALC 94 07/10/2019   TRIG 96 07/10/2019   CHOLHDL 2.5 07/10/2019    Significant Diagnostic Results in last 30 days:  No results found.  Assessment/Plan 1. DNR (do not resuscitate) (Primary)   2. Bilateral leg edema With Weight up and Cough will start her on Lasix  20 mg every day With Potassium 10 meq QD  3.  Subacute cough Schedule Duo nebs in AM  4. Essential hypertension BP is up also  Lasix  will help    Family/ staff Communication:   Labs/tests ordered:  BMP in 1 week

## 2023-11-18 DIAGNOSIS — M6281 Muscle weakness (generalized): Secondary | ICD-10-CM | POA: Diagnosis not present

## 2023-11-18 DIAGNOSIS — R262 Difficulty in walking, not elsewhere classified: Secondary | ICD-10-CM | POA: Diagnosis not present

## 2023-11-18 DIAGNOSIS — R278 Other lack of coordination: Secondary | ICD-10-CM | POA: Diagnosis not present

## 2023-11-19 ENCOUNTER — Encounter: Payer: Self-pay | Admitting: Orthopedic Surgery

## 2023-11-19 ENCOUNTER — Non-Acute Institutional Stay (SKILLED_NURSING_FACILITY): Admitting: Orthopedic Surgery

## 2023-11-19 DIAGNOSIS — R052 Subacute cough: Secondary | ICD-10-CM

## 2023-11-19 DIAGNOSIS — H6123 Impacted cerumen, bilateral: Secondary | ICD-10-CM | POA: Diagnosis not present

## 2023-11-19 DIAGNOSIS — I10 Essential (primary) hypertension: Secondary | ICD-10-CM | POA: Diagnosis not present

## 2023-11-19 DIAGNOSIS — R600 Localized edema: Secondary | ICD-10-CM | POA: Diagnosis not present

## 2023-11-19 DIAGNOSIS — G459 Transient cerebral ischemic attack, unspecified: Secondary | ICD-10-CM | POA: Diagnosis not present

## 2023-11-19 DIAGNOSIS — M6281 Muscle weakness (generalized): Secondary | ICD-10-CM | POA: Diagnosis not present

## 2023-11-19 DIAGNOSIS — R278 Other lack of coordination: Secondary | ICD-10-CM | POA: Diagnosis not present

## 2023-11-19 DIAGNOSIS — R262 Difficulty in walking, not elsewhere classified: Secondary | ICD-10-CM | POA: Diagnosis not present

## 2023-11-19 DIAGNOSIS — R2681 Unsteadiness on feet: Secondary | ICD-10-CM

## 2023-11-19 DIAGNOSIS — R4189 Other symptoms and signs involving cognitive functions and awareness: Secondary | ICD-10-CM

## 2023-11-19 DIAGNOSIS — N390 Urinary tract infection, site not specified: Secondary | ICD-10-CM

## 2023-11-19 NOTE — Progress Notes (Signed)
 Location:   [facility] Room Number: 14-A [name]:  SNF [phone]) Provider:  Charlanne Fredia CROME, MD   PCP: Charlanne Fredia CROME, MD  Patient Care Team: Charlanne Fredia CROME, MD as PCP - General (Internal Medicine) Court Dorn PARAS, MD as PCP - Cardiology (Cardiology) Celestia Agent, MD (Inactive) as Consulting Physician (Gastroenterology) Nicholaus Tanda CROME DOUGLAS, MD as Attending Physician (Urology) Court Dorn PARAS, MD as Consulting Physician (Cardiology) Ethyl Lonni BRAVO, MD (Inactive) as Consulting Physician (Otolaryngology) Clare Senior, DDS (Dentistry) Rosan Credit, MD as Consulting Physician (Ophthalmology) Shari Easter, MD as Consulting Physician (Orthopedic Surgery) Tanda Locus, MD as Consulting Physician (General Surgery)  Extended Emergency Contact Information Primary Emergency Contact: Tawney,Don Address: [address] [place] [address] [place] Home Phone: [phone] Mobile Phone: [phone] Relation: Son Secondary Emergency Contact: Bertrum CHRISTELLA Gibson Address: [address]          [place], [place] [address] [place] Home Phone: [phone] Work Phone: [phone] Mobile Phone: [phone] Relation: Daughter  Code Status:  DNR Goals of care: Advanced Directive information    11/19/2023   11:19 AM  Advanced Directives  Does Patient Have a Medical Advance Directive? Yes  Type of Estate agent of [place];Living will;Out of facility DNR (pink MOST or yellow form)  Does patient want to make changes to medical advance directive? No - Patient declined  Copy of Healthcare Power of Attorney in Chart? Yes - validated most recent copy scanned in chart (See row information)     Chief Complaint  Patient presents with   Medical Management of Chronic Issues    Routine visit. Discuss the need for Covid Booster.    HPI:  Pt is a 89 y.o. female seen today for medical  management of chronic diseases.    She currently resides on the skilled nursing unit at [facility]. PMH: hypertension, hyperlipidemia, macular degeneration, mild cognitive impairment, recurrent UTI, GERD, H/o TIA.   Subacute cough- onset 10/12/2023, CXR was unremarkable, BNP was 96, 07/03 started on furosemide /KCL due to weight gain and persistent cough, also started on duonebs QAM, recheck BNP scheduled 07/10 Lower leg edema- see above, compression stockings daily  Cognitive impairment- MMSE 28/30 08/2022, recent BIMS 8/15 (05/23)> was 8/15 (02/25), was 12/15 (11/14) HTN- BUN/creat 24/0.64 10/15/2023, remains on metoprolol  TIA- noted in 2014, not followed by neurology, remains on Plavix  Recurrent UTI- followed by urology, remains on Keflex  Unstable gait- ambulates with wheelchair, 1+ assist with ambulation, working with OT at this time> reports sob with exertion> no desats, no recent falls   Recent blood pressures:  07/01- 157/80  06/24- 158/80  06/17- 145/87  Recent weights:  07/02- 175.1 lbs  06/01- 171.7 lbs  05/01- 173.6 lbs    Past Medical History:  Diagnosis Date   Cervicalgia    Chest pain at rest 01/07/2012   Displacement of cervical intervertebral disc without myelopathy    Diverticulosis of colon (without mention of hemorrhage)    Female stress incontinence    GERD (gastroesophageal reflux disease)    no peds occasional pepcid   Inguinal hernia without mention of obstruction or gangrene, unilateral or unspecified, (not specified as recurrent)    Internal hemorrhoids without mention of complication    Lumbago    Macular degeneration (senile) of retina, unspecified    Osteoarthrosis, unspecified whether generalized or localized, unspecified site    Other and unspecified hyperlipidemia  Pain in joint, hand    Palpitations    Reflux esophagitis    Scoliosis (and kyphoscoliosis), idiopathic    Senile osteoporosis    Skin disorder    Thyroid  disease    TIA  (transient ischemic attack)    Unruptured popliteal cyst 06/24/2014   Right knee    Unspecified essential hypertension    Unspecified glaucoma(365.9)    Unspecified hypothyroidism    Unspecified vitamin D  deficiency    Past Surgical History:  Procedure Laterality Date   ABDOMINAL HYSTERECTOMY  1975   Dr Sharron   APPENDECTOMY  1988   cardiolite myocardial perfusion study     DOPPLER ECHOCARDIOGRAPHY     EYE SURGERY Bilateral 2009   cataract removed right eye, Dr Carrie   FOOT SURGERY  august 2013   Hewitt, MD   INGUINAL HERNIA REPAIR Bilateral    w/mesh   INGUINAL HERNIA REPAIR Left 08/03/2017   Procedure: LAPAROSCOPIC LEFT INGUINAL HERNIA REPAIR WITH MESH;  Surgeon: Tanda Locus, MD;  Location: [facility] OR;  Service: General;  Laterality: Left;   INGUINAL HERNIA REPAIR Right 08/03/2017   Procedure: HERNIA REPAIR RIGHT INGUINAL ADULT WITH MESH;  Surgeon: Tanda Locus, MD;  Location: [facility] OR;  Service: General;  Laterality: Right;   INGUINAL HERNIA REPAIR     Dr. Tanda 04-24-18   INGUINAL HERNIA REPAIR Right 04/24/2018   Procedure: DIAGNOSTIC LAPAROSCOPIC, OPENREPAIR OF RECURRENT RIGHT INGUINAL HERNIA WITH MESH ERAS PATHWAY;  Surgeon: Tanda Locus, MD;  Location: [facility] ORS;  Service: General;  Laterality: Right;   INSERTION OF MESH Bilateral 08/03/2017   Procedure: INSERTION OF MESH;  Surgeon: Tanda Locus, MD;  Location: [facility] OR;  Service: General;  Laterality: Bilateral;   NM MYOVIEW  LTD     negative   TONSILLECTOMY  1937   TOOTH EXTRACTION  09/16/13   Dr Maryjean    Allergies  Allergen Reactions   Trimethoprim Other (See Comments)    Headaches/ ear pressure    Erythromycin Other (See Comments)    UNSPECIFIED REACTION    Macrodantin [Nitrofurantoin Macrocrystal] Other (See Comments)    UNSPECIFIED REACTION    Other     Honeydew Melon   Penicillins Other (See Comments)    UNSPECIFIED REACTION  Has patient had a PCN reaction causing immediate rash, facial/tongue/throat swelling, SOB or  lightheadedness with hypotension: Unknown Has patient had a PCN reaction causing severe rash involving mucus membranes or skin necrosis: Unknown Has patient had a PCN reaction that required hospitalization: No Has patient had a PCN reaction occurring within the last 10 years: No If all of the above answers are NO, then may proceed with Cephalosporin use.   Sulfa Antibiotics Other (See Comments)    UNSPECIFIED REACTION    Latex Itching    Allergies as of 11/19/2023       Reactions   Trimethoprim Other (See Comments)   Headaches/ ear pressure    Erythromycin Other (See Comments)   UNSPECIFIED REACTION    Macrodantin [nitrofurantoin Macrocrystal] Other (See Comments)   UNSPECIFIED REACTION    Other    Honeydew Melon   Penicillins Other (See Comments)   UNSPECIFIED REACTION  Has patient had a PCN reaction causing immediate rash, facial/tongue/throat swelling, SOB or lightheadedness with hypotension: Unknown Has patient had a PCN reaction causing severe rash involving mucus membranes or skin necrosis: Unknown Has patient had a PCN reaction that required hospitalization: No Has patient had a PCN reaction occurring within the last 10 years: No If all of  the above answers are NO, then may proceed with Cephalosporin use.   Sulfa Antibiotics Other (See Comments)   UNSPECIFIED REACTION    Latex Itching        Medication List        Accurate as of November 19, 2023 11:19 AM. If you have any questions, ask your nurse or doctor.          acetaminophen  500 MG tablet Commonly known as: TYLENOL  Take 500 mg by mouth 2 (two) times daily as needed for mild pain or headache.   acetaminophen  500 MG tablet Commonly known as: TYLENOL  Take 1,000 mg by mouth at bedtime.   albuterol  108 (90 Base) MCG/ACT inhaler Commonly known as: VENTOLIN  HFA Inhale 2 puffs into the lungs every 6 (six) hours as needed for wheezing or shortness of breath.   ascorbic acid 500 MG tablet Commonly known as:  VITAMIN C Take 500 mg by mouth daily with supper.   AZO CRANBERRY PO Take 1 tablet by mouth daily.   Biofreeze Roll-On 4 % Gel Generic drug: Menthol (Topical Analgesic) Apply 1 Application topically in the morning and at bedtime. Apply to right upper arm   Biofreeze Roll-On 4 % Gel Generic drug: Menthol (Topical Analgesic) Apply 1 application  topically 2 (two) times daily. Apply to right knee   cephALEXin  250 MG capsule Commonly known as: KEFLEX  Take 250 mg by mouth daily.   clopidogrel  75 MG tablet Commonly known as: PLAVIX  TAKE ONE TABLET BY MOUTH DAILY   collagenase 250 UNIT/GM ointment Commonly known as: SANTYL Apply 1 Application topically daily.   Cranberry 500 MG Tabs Take 1 tablet by mouth daily.   furosemide  20 MG tablet Commonly known as: LASIX  Take 1 tablet (20 mg total) by mouth daily.   ipratropium-albuterol  0.5-2.5 (3) MG/3ML Soln Commonly known as: DUONEB Inhale 3 mLs into the lungs every 12 (twelve) hours as needed.   ipratropium-albuterol  0.5-2.5 (3) MG/3ML Soln Commonly known as: DUONEB Take 3 mLs by nebulization daily.   metoprolol  succinate 25 MG 24 hr tablet Commonly known as: TOPROL -XL Take one tablet by mouth once daily to regulate heart and control blood pressure.   potassium chloride  10 MEQ tablet Commonly known as: KLOR-CON  M Take 1 tablet (10 mEq total) by mouth once for 1 dose.   PRESERVISION AREDS 2 PO Take 1 capsule by mouth 2 (two) times daily.   Vitamin D3 50 MCG (2000 UT) Tabs Take by mouth.   Zinc Oxide 10 % Oint Apply 1 Application topically as needed.        Review of Systems  Constitutional:  Negative for fatigue and fever.  HENT:  Negative for sore throat and trouble swallowing.   Eyes:  Negative for visual disturbance.  Respiratory:  Positive for cough, shortness of breath and wheezing.   Cardiovascular:  Positive for leg swelling. Negative for chest pain.  Gastrointestinal:  Negative for abdominal distention  and abdominal pain.  Genitourinary:  Negative for dysuria and hematuria.  Musculoskeletal:  Positive for gait problem.  Skin:  Negative for wound.  Neurological:  Positive for weakness. Negative for dizziness and headaches.  Psychiatric/Behavioral:  Positive for confusion. Negative for dysphoric mood. The patient is not nervous/anxious.     Immunization History  Administered Date(s) Administered   Fluad Quad(high Dose 65+) 02/04/2019, 03/01/2022   Influenza Whole 02/24/2011, 02/12/2012   Influenza, High Dose Seasonal PF 02/01/2017, 02/25/2018, 02/11/2020, 03/13/2023   Influenza,inj,Quad PF,6+ Mos 02/20/2013, 02/10/2014, 02/19/2015, 02/18/2016   Influenza-Unspecified 02/16/2021  Moderna Covid-19 Fall Seasonal Vaccine 5yrs & older 02/09/2022, 03/21/2023   Moderna Sars-Covid-2 Vaccination 05/19/2019, 06/16/2019, 03/29/2020, 09/27/2020   PFIZER(Purple Top)SARS-COV-2 Vaccination 02/02/2021   Pneumococcal Conjugate-13 06/24/2014   Pneumococcal Polysaccharide-23 04/12/1998   Td 02/21/1996, 07/23/2003   Tdap 03/20/2010   Zoster Recombinant(Shingrix) 09/19/2016, 12/02/2016   Zoster, Live 09/15/2005   Pertinent  Health Maintenance Due  Topic Date Due   INFLUENZA VACCINE  12/14/2023   DEXA SCAN  Completed      09/06/2022    2:27 PM 12/27/2022    2:15 PM 04/18/2023    4:49 PM 08/27/2023    2:51 PM 09/14/2023   12:18 PM  Fall Risk  Falls in the past year? 1 0 0 1 1  Was there an injury with Fall? 1 0 0 0 0  Fall Risk Category Calculator 2 0 0 1 1  Patient at Risk for Falls Due to History of fall(s);Impaired balance/gait;Impaired mobility No Fall Risks History of fall(s);Impaired balance/gait;Impaired mobility History of fall(s);Impaired mobility History of fall(s);Impaired balance/gait  Fall risk Follow up Falls evaluation completed;Education provided;Falls prevention discussed Falls evaluation completed;Education provided;Falls prevention discussed Falls evaluation completed;Education  provided;Falls prevention discussed Falls evaluation completed;Education provided Falls evaluation completed;Education provided   Functional Status Survey:    Vitals:   11/19/23 1105  BP: (!) 157/80  Pulse: 83  Resp: 20  Temp: (!) 97 F (36.1 C)  SpO2: 94%  Weight: 175 lb 1.6 oz (79.4 kg)  Height: 5' 2 (1.575 m)   Body mass index is 32.03 kg/m. Physical Exam Vitals reviewed.  Constitutional:      General: She is not in acute distress. HENT:     Head: Normocephalic.     Right Ear: There is impacted cerumen.     Left Ear: There is impacted cerumen.     Nose: Nose normal.     Mouth/Throat:     Mouth: Mucous membranes are moist.  Eyes:     General:        Right eye: No discharge.        Left eye: No discharge.  Cardiovascular:     Rate and Rhythm: Normal rate and regular rhythm.     Pulses: Normal pulses.     Heart sounds: Normal heart sounds.  Pulmonary:     Effort: Pulmonary effort is normal. No respiratory distress.     Breath sounds: Wheezing and rales present. No rhonchi.  Abdominal:     General: Bowel sounds are normal. There is no distension.     Palpations: Abdomen is soft.     Tenderness: There is no abdominal tenderness.  Musculoskeletal:     Cervical back: Neck supple.     Right lower leg: Edema present.     Left lower leg: Edema present.     Comments: Non pitting, compression stockings present  Skin:    General: Skin is warm.     Capillary Refill: Capillary refill takes less than 2 seconds.  Neurological:     General: No focal deficit present.     Mental Status: She is alert. Mental status is at baseline.     Motor: Weakness present.     Gait: Gait abnormal.  Psychiatric:        Mood and Affect: Mood normal.     Labs reviewed: Recent Labs    03/05/23 0000 10/15/23 0000  NA 140 141  K 4.2 4.2  CL 105 106  CO2 27* 27*  BUN 24* 24*  CREATININE 0.6 0.6  CALCIUM  8.8 8.9   Recent Labs    03/05/23 0000 10/15/23 0000  AST 14 13  ALT 16  13  ALKPHOS 71 54  ALBUMIN 3.4* 3.5   Recent Labs    03/05/23 0000 10/15/23 0000  WBC 7.4 6.9  NEUTROABS 4,151.00 3,436.00  HGB 12.2 11.2*  HCT 37 34*  PLT 219 221   Lab Results  Component Value Date   TSH 3.20 10/31/2022   Lab Results  Component Value Date   HGBA1C 5.3 05/02/2013   Lab Results  Component Value Date   CHOL 188 07/10/2019   HDL 75 07/10/2019   LDLCALC 94 07/10/2019   TRIG 96 07/10/2019   CHOLHDL 2.5 07/10/2019    Significant Diagnostic Results in last 30 days:  No results found.  Assessment/Plan 1. Bilateral impacted cerumen (Primary) - start Debrox at bedtime x 5 days - flush ears when Debrox complete  2. Subacute cough - onset 05/30 - CXR unremarkable  - BNP was 96 - 07/03 started on furosemide /KCL due to weight gain, edema and cough - 07/10 repeat BNP - start weights 3x/week x 1 month - add BMP 07/10  3. Lower leg edema - see above - cont furosemide   - cont compression stockings  4. Cognitive impairment - no behaviors - dependent with ADLs except feeding - cont skilled nursing care  5. Essential hypertension - uncontrolled, goal < 150/90 - see above  - cont metoprolol   6. TIA (transient ischemic attack) - cont plavix   7. Recurrent UTI - cont Keflex   8. Unstable gait - no recent falls - cont OT    Family/ staff Communication: plan discussed with patient and nurse  Labs/tests ordered:  BMP 07/10

## 2023-11-21 DIAGNOSIS — M6281 Muscle weakness (generalized): Secondary | ICD-10-CM | POA: Diagnosis not present

## 2023-11-21 DIAGNOSIS — R262 Difficulty in walking, not elsewhere classified: Secondary | ICD-10-CM | POA: Diagnosis not present

## 2023-11-21 DIAGNOSIS — R278 Other lack of coordination: Secondary | ICD-10-CM | POA: Diagnosis not present

## 2023-11-22 DIAGNOSIS — R262 Difficulty in walking, not elsewhere classified: Secondary | ICD-10-CM | POA: Diagnosis not present

## 2023-11-22 DIAGNOSIS — R278 Other lack of coordination: Secondary | ICD-10-CM | POA: Diagnosis not present

## 2023-11-22 DIAGNOSIS — R7989 Other specified abnormal findings of blood chemistry: Secondary | ICD-10-CM | POA: Diagnosis not present

## 2023-11-22 DIAGNOSIS — M6281 Muscle weakness (generalized): Secondary | ICD-10-CM | POA: Diagnosis not present

## 2023-11-22 LAB — BASIC METABOLIC PANEL WITH GFR
BUN: 19 (ref 4–21)
CO2: 27 — AB (ref 13–22)
Chloride: 105 (ref 99–108)
Creatinine: 0.6 (ref 0.5–1.1)
Glucose: 86
Potassium: 4.1 meq/L (ref 3.5–5.1)
Sodium: 140 (ref 137–147)

## 2023-11-22 LAB — COMPREHENSIVE METABOLIC PANEL WITH GFR: Calcium: 8.7 (ref 8.7–10.7)

## 2023-11-23 DIAGNOSIS — R262 Difficulty in walking, not elsewhere classified: Secondary | ICD-10-CM | POA: Diagnosis not present

## 2023-11-23 DIAGNOSIS — R278 Other lack of coordination: Secondary | ICD-10-CM | POA: Diagnosis not present

## 2023-11-23 DIAGNOSIS — M6281 Muscle weakness (generalized): Secondary | ICD-10-CM | POA: Diagnosis not present

## 2023-11-26 DIAGNOSIS — M6281 Muscle weakness (generalized): Secondary | ICD-10-CM | POA: Diagnosis not present

## 2023-11-26 DIAGNOSIS — R278 Other lack of coordination: Secondary | ICD-10-CM | POA: Diagnosis not present

## 2023-11-26 DIAGNOSIS — R262 Difficulty in walking, not elsewhere classified: Secondary | ICD-10-CM | POA: Diagnosis not present

## 2023-11-27 DIAGNOSIS — M6281 Muscle weakness (generalized): Secondary | ICD-10-CM | POA: Diagnosis not present

## 2023-11-27 DIAGNOSIS — R262 Difficulty in walking, not elsewhere classified: Secondary | ICD-10-CM | POA: Diagnosis not present

## 2023-11-27 DIAGNOSIS — R278 Other lack of coordination: Secondary | ICD-10-CM | POA: Diagnosis not present

## 2023-11-28 ENCOUNTER — Encounter: Payer: Self-pay | Admitting: Orthopedic Surgery

## 2023-11-28 ENCOUNTER — Non-Acute Institutional Stay (SKILLED_NURSING_FACILITY): Payer: Self-pay | Admitting: Orthopedic Surgery

## 2023-11-28 DIAGNOSIS — R600 Localized edema: Secondary | ICD-10-CM

## 2023-11-28 DIAGNOSIS — R062 Wheezing: Secondary | ICD-10-CM | POA: Diagnosis not present

## 2023-11-28 DIAGNOSIS — S81812D Laceration without foreign body, left lower leg, subsequent encounter: Secondary | ICD-10-CM

## 2023-11-28 DIAGNOSIS — M6281 Muscle weakness (generalized): Secondary | ICD-10-CM | POA: Diagnosis not present

## 2023-11-28 DIAGNOSIS — R262 Difficulty in walking, not elsewhere classified: Secondary | ICD-10-CM | POA: Diagnosis not present

## 2023-11-28 DIAGNOSIS — R278 Other lack of coordination: Secondary | ICD-10-CM | POA: Diagnosis not present

## 2023-11-28 NOTE — Progress Notes (Signed)
 Location:   [facility]  Nursing Home Room Number: 14-A [name]:  SNF [phone]) Provider:  Greig Cluster, NP  PCP: Charlanne Fredia CROME, MD  Patient Care Team: Charlanne Fredia CROME, MD as PCP - General (Internal Medicine) Court Dorn PARAS, MD as PCP - Cardiology (Cardiology) Celestia Agent, MD (Inactive) as Consulting Physician (Gastroenterology) Nicholaus Tanda CROME DOUGLAS, MD as Attending Physician (Urology) Court Dorn PARAS, MD as Consulting Physician (Cardiology) Ethyl Lonni BRAVO, MD (Inactive) as Consulting Physician (Otolaryngology) Clare Senior, DDS (Dentistry) Rosan Credit, MD as Consulting Physician (Ophthalmology) Shari Easter, MD as Consulting Physician (Orthopedic Surgery) Tanda Locus, MD as Consulting Physician (General Surgery)  Extended Emergency Contact Information Primary Emergency Contact: Letts,Don Address: [address] [place] [address] [place] Home Phone: [phone] Mobile Phone: [phone] Relation: Son Secondary Emergency Contact: Bertrum CHRISTELLA Gibson Address: [address]          [place], [place] [address] [place] Home Phone: [phone] Work Phone: [phone] Mobile Phone: [phone] Relation: Daughter  Code Status:  DNR Goals of care: Advanced Directive information    11/28/2023   10:14 AM  Advanced Directives  Does Patient Have a Medical Advance Directive? Yes  Type of Estate agent of [place];Living will;Out of facility DNR (pink MOST or yellow form)  Does patient want to make changes to medical advance directive? No - Patient declined  Copy of Healthcare Power of Attorney in Chart? Yes - validated most recent copy scanned in chart (See row information)     Chief Complaint  Patient presents with   Acute Visit    Left lower extremity wound & wheezing.     HPI:  Pt is a 89 y.o. female seen today for acute visit due left lower leg wound.   She  currently resides on the skilled nursing unit at [facility]. PMH: hypertension, hyperlipidemia, macular degeneration, mild cognitive impairment, recurrent UTI, GERD, H/o TIA.   05/13 she developed skin tear to lef lower leg during transfer. 06/23 she was prescribed Santyl and doxycycline  x 7 days for suspected cellulitis. Today, wound remains slow healing. Purulent drainage, erythema, tenderness have subsided. She denies pain to area. Afebrile. Vitals stable.   06/04 prescribed prednisone and duonebs due to ongoing cough and wheezing. 06/02 BNP 96. 07/10 BNP 134. CXR was unremarkable. She continues to have increased cough and wheezing with exertion per OT. Duonebs are twice daily. She reports improvement after duonebs given.   07/03 furosemide  started for weight gain and lower leg edema. Recent BUN/creat 19/0.62 11/22/2023.    Past Medical History:  Diagnosis Date   Cervicalgia    Chest pain at rest 01/07/2012   Displacement of cervical intervertebral disc without myelopathy    Diverticulosis of colon (without mention of hemorrhage)    Female stress incontinence    GERD (gastroesophageal reflux disease)    no peds occasional pepcid   Inguinal hernia without mention of obstruction or gangrene, unilateral or unspecified, (not specified as recurrent)    Internal hemorrhoids without mention of complication    Lumbago    Macular degeneration (senile) of retina, unspecified    Osteoarthrosis, unspecified whether generalized or localized, unspecified site    Other and unspecified hyperlipidemia    Pain in joint, hand    Palpitations    Reflux esophagitis    Scoliosis (and kyphoscoliosis), idiopathic    Senile osteoporosis    Skin disorder  Thyroid  disease    TIA (transient ischemic attack)    Unruptured popliteal cyst 06/24/2014   Right knee    Unspecified essential hypertension    Unspecified glaucoma(365.9)    Unspecified hypothyroidism    Unspecified vitamin D  deficiency     Past Surgical History:  Procedure Laterality Date   ABDOMINAL HYSTERECTOMY  1975   Dr Sharron   APPENDECTOMY  1988   cardiolite myocardial perfusion study     DOPPLER ECHOCARDIOGRAPHY     EYE SURGERY Bilateral 2009   cataract removed right eye, Dr Carrie   FOOT SURGERY  august 2013   Hewitt, MD   INGUINAL HERNIA REPAIR Bilateral    w/mesh   INGUINAL HERNIA REPAIR Left 08/03/2017   Procedure: LAPAROSCOPIC LEFT INGUINAL HERNIA REPAIR WITH MESH;  Surgeon: Tanda Locus, MD;  Location: [facility] OR;  Service: General;  Laterality: Left;   INGUINAL HERNIA REPAIR Right 08/03/2017   Procedure: HERNIA REPAIR RIGHT INGUINAL ADULT WITH MESH;  Surgeon: Tanda Locus, MD;  Location: [facility] OR;  Service: General;  Laterality: Right;   INGUINAL HERNIA REPAIR     Dr. Tanda 04-24-18   INGUINAL HERNIA REPAIR Right 04/24/2018   Procedure: DIAGNOSTIC LAPAROSCOPIC, OPENREPAIR OF RECURRENT RIGHT INGUINAL HERNIA WITH MESH ERAS PATHWAY;  Surgeon: Tanda Locus, MD;  Location: [facility] ORS;  Service: General;  Laterality: Right;   INSERTION OF MESH Bilateral 08/03/2017   Procedure: INSERTION OF MESH;  Surgeon: Tanda Locus, MD;  Location: [facility] OR;  Service: General;  Laterality: Bilateral;   NM MYOVIEW  LTD     negative   TONSILLECTOMY  1937   TOOTH EXTRACTION  09/16/13   Dr Maryjean    Allergies  Allergen Reactions   Trimethoprim Other (See Comments)    Headaches/ ear pressure    Erythromycin Other (See Comments)    UNSPECIFIED REACTION    Macrodantin [Nitrofurantoin Macrocrystal] Other (See Comments)    UNSPECIFIED REACTION    Other     Honeydew Melon   Penicillins Other (See Comments)    UNSPECIFIED REACTION  Has patient had a PCN reaction causing immediate rash, facial/tongue/throat swelling, SOB or lightheadedness with hypotension: Unknown Has patient had a PCN reaction causing severe rash involving mucus membranes or skin necrosis: Unknown Has patient had a PCN reaction that required hospitalization: No Has patient  had a PCN reaction occurring within the last 10 years: No If all of the above answers are NO, then may proceed with Cephalosporin use.   Sulfa Antibiotics Other (See Comments)    UNSPECIFIED REACTION    Latex Itching    Allergies as of 11/28/2023       Reactions   Trimethoprim Other (See Comments)   Headaches/ ear pressure    Erythromycin Other (See Comments)   UNSPECIFIED REACTION    Macrodantin [nitrofurantoin Macrocrystal] Other (See Comments)   UNSPECIFIED REACTION    Other    Honeydew Melon   Penicillins Other (See Comments)   UNSPECIFIED REACTION  Has patient had a PCN reaction causing immediate rash, facial/tongue/throat swelling, SOB or lightheadedness with hypotension: Unknown Has patient had a PCN reaction causing severe rash involving mucus membranes or skin necrosis: Unknown Has patient had a PCN reaction that required hospitalization: No Has patient had a PCN reaction occurring within the last 10 years: No If all of the above answers are NO, then may proceed with Cephalosporin use.   Sulfa Antibiotics Other (See Comments)   UNSPECIFIED REACTION    Latex Itching  Medication List        Accurate as of November 28, 2023 10:16 AM. If you have any questions, ask your nurse or doctor.          acetaminophen  500 MG tablet Commonly known as: TYLENOL  Take 500 mg by mouth 2 (two) times daily as needed for mild pain or headache.   acetaminophen  500 MG tablet Commonly known as: TYLENOL  Take 1,000 mg by mouth at bedtime.   albuterol  108 (90 Base) MCG/ACT inhaler Commonly known as: VENTOLIN  HFA Inhale 2 puffs into the lungs every 6 (six) hours as needed for wheezing or shortness of breath.   ascorbic acid 500 MG tablet Commonly known as: VITAMIN C Take 500 mg by mouth daily with supper.   Biofreeze Roll-On 4 % Gel Generic drug: Menthol (Topical Analgesic) Apply 1 Application topically in the morning and at bedtime. Apply to right upper arm   Biofreeze  Roll-On 4 % Gel Generic drug: Menthol (Topical Analgesic) Apply 1 application  topically 2 (two) times daily. Apply to right knee   cephALEXin  250 MG capsule Commonly known as: KEFLEX  Take 250 mg by mouth daily.   clopidogrel  75 MG tablet Commonly known as: PLAVIX  TAKE ONE TABLET BY MOUTH DAILY   collagenase 250 UNIT/GM ointment Commonly known as: SANTYL Apply 1 Application topically daily.   Cranberry 500 MG Tabs Take 1 tablet by mouth daily.   furosemide  20 MG tablet Commonly known as: LASIX  Take 1 tablet (20 mg total) by mouth daily.   ipratropium-albuterol  0.5-2.5 (3) MG/3ML Soln Commonly known as: DUONEB Inhale 3 mLs into the lungs every 12 (twelve) hours as needed.   ipratropium-albuterol  0.5-2.5 (3) MG/3ML Soln Commonly known as: DUONEB Take 3 mLs by nebulization 2 (two) times daily.   metoprolol  succinate 25 MG 24 hr tablet Commonly known as: TOPROL -XL Take one tablet by mouth once daily to regulate heart and control blood pressure.   potassium chloride  10 MEQ tablet Commonly known as: KLOR-CON  Take 10 mEq by mouth every morning.   potassium chloride  10 MEQ tablet Commonly known as: KLOR-CON  M Take 1 tablet (10 mEq total) by mouth once for 1 dose.   PRESERVISION AREDS 2 PO Take 1 capsule by mouth 2 (two) times daily.   Vitamin D3 50 MCG (2000 UT) Tabs Take 50 mcg by mouth daily.   Zinc Oxide 10 % Oint Apply 1 Application topically as needed.        Review of Systems  Constitutional:  Negative for fatigue and fever.  HENT:  Positive for hearing loss. Negative for sore throat and trouble swallowing.   Eyes:  Negative for visual disturbance.  Respiratory:  Positive for cough and wheezing. Negative for shortness of breath.   Cardiovascular:  Positive for leg swelling. Negative for chest pain.  Gastrointestinal:  Negative for abdominal distention and abdominal pain.  Genitourinary:  Negative for dysuria and hematuria.  Musculoskeletal:  Positive for  gait problem.  Skin:  Negative for wound.  Neurological:  Positive for weakness. Negative for dizziness and headaches.  Psychiatric/Behavioral:  Positive for confusion and dysphoric mood. Negative for sleep disturbance. The patient is not nervous/anxious.     Immunization History  Administered Date(s) Administered   Fluad Quad(high Dose 65+) 02/04/2019, 03/01/2022   Influenza Whole 02/24/2011, 02/12/2012   Influenza, High Dose Seasonal PF 02/01/2017, 02/25/2018, 02/11/2020, 03/13/2023   Influenza,inj,Quad PF,6+ Mos 02/20/2013, 02/10/2014, 02/19/2015, 02/18/2016   Influenza-Unspecified 02/16/2021   Moderna Covid-19 Fall Seasonal Vaccine 40yrs & older 02/09/2022, 03/21/2023  Moderna Sars-Covid-2 Vaccination 05/19/2019, 06/16/2019, 03/29/2020, 09/27/2020   PFIZER(Purple Top)SARS-COV-2 Vaccination 02/02/2021   Pneumococcal Conjugate-13 06/24/2014   Pneumococcal Polysaccharide-23 04/12/1998   Td 02/21/1996, 07/23/2003   Tdap 03/20/2010   Zoster Recombinant(Shingrix) 09/19/2016, 12/02/2016   Zoster, Live 09/15/2005   Pertinent  Health Maintenance Due  Topic Date Due   INFLUENZA VACCINE  12/14/2023   DEXA SCAN  Completed      09/06/2022    2:27 PM 12/27/2022    2:15 PM 04/18/2023    4:49 PM 08/27/2023    2:51 PM 09/14/2023   12:18 PM  Fall Risk  Falls in the past year? 1 0 0 1 1  Was there an injury with Fall? 1 0 0 0 0  Fall Risk Category Calculator 2 0 0 1 1  Patient at Risk for Falls Due to History of fall(s);Impaired balance/gait;Impaired mobility No Fall Risks History of fall(s);Impaired balance/gait;Impaired mobility History of fall(s);Impaired mobility History of fall(s);Impaired balance/gait  Fall risk Follow up Falls evaluation completed;Education provided;Falls prevention discussed Falls evaluation completed;Education provided;Falls prevention discussed Falls evaluation completed;Education provided;Falls prevention discussed Falls evaluation completed;Education provided Falls  evaluation completed;Education provided   Functional Status Survey:    Vitals:   11/28/23 1007  BP: (!) 165/80  Pulse: 79  Resp: 15  Temp: 97.6 F (36.4 C)  SpO2: 96%  Weight: 190 lb 12.8 oz (86.5 kg)  Height: 5' 2 (1.575 m)   Body mass index is 34.9 kg/m. Physical Exam Vitals reviewed.  Constitutional:      General: She is not in acute distress. HENT:     Head: Normocephalic.     Nose: Nose normal.     Mouth/Throat:     Mouth: Mucous membranes are moist.     Pharynx: No posterior oropharyngeal erythema.  Eyes:     General:        Right eye: No discharge.        Left eye: No discharge.  Cardiovascular:     Rate and Rhythm: Normal rate and regular rhythm.     Pulses: Normal pulses.     Heart sounds: Normal heart sounds.  Pulmonary:     Effort: Pulmonary effort is normal. No respiratory distress.     Breath sounds: Wheezing present. No rhonchi or rales.  Abdominal:     General: Bowel sounds are normal. There is no distension.     Palpations: Abdomen is soft.     Tenderness: There is no abdominal tenderness.  Musculoskeletal:     Cervical back: Neck supple.     Right lower leg: Edema present.     Left lower leg: Edema present.     Comments: Non pitting edema to BLE  Skin:    General: Skin is warm.     Capillary Refill: Capillary refill takes less than 2 seconds.     Findings: Lesion present.     Comments: Approx 1 cm open lesion to left shin, wound bed with 25% granulation tissue, no slough/purulent drainage/ erythema or tenderness present.   Neurological:     General: No focal deficit present.     Mental Status: She is alert.     Gait: Gait abnormal.  Psychiatric:        Mood and Affect: Mood normal.     Labs reviewed: Recent Labs    03/05/23 0000 10/15/23 0000 11/22/23 0000  NA 140 141 140  K 4.2 4.2 4.1  CL 105 106 105  CO2 27* 27* 27*  BUN 24* 24*  19  CREATININE 0.6 0.6 0.6  CALCIUM  8.8 8.9 8.7   Recent Labs    03/05/23 0000  10/15/23 0000  AST 14 13  ALT 16 13  ALKPHOS 71 54  ALBUMIN 3.4* 3.5   Recent Labs    03/05/23 0000 10/15/23 0000  WBC 7.4 6.9  NEUTROABS 4,151.00 3,436.00  HGB 12.2 11.2*  HCT 37 34*  PLT 219 221   Lab Results  Component Value Date   TSH 3.20 10/31/2022   Lab Results  Component Value Date   HGBA1C 5.3 05/02/2013   Lab Results  Component Value Date   CHOL 188 07/10/2019   HDL 75 07/10/2019   LDLCALC 94 07/10/2019   TRIG 96 07/10/2019   CHOLHDL 2.5 07/10/2019    Significant Diagnostic Results in last 30 days:  No results found.  Assessment/Plan 1. Noninfected skin tear of left lower extremity, subsequent encounter (Primary) - ongoing - DOI 05/13 - 06/23 Santyl and doxycyline x 7 days started - improved erythema, swelling, tenderness, granulation tissue present  - suspect slow healing wound versus infection - discontinue Santyl - start hydrafera blue dressing changes every other day - if no improvement with new dressing, recommend wound culture   2. Wheezing - ongoing - CXR was unremarkable  - differentials: deconditioning, asthma, COPD/chronic bronchitis, CHF - improved symptoms with duonebs  - cont duonebs BID - consider tying Symbicort in future  3. Bilateral leg edema - 07/03 started on furosemide /KCL  - 06/02 BNP 96> 07/10 BNP 134 - BUN/creat 19/0.62, K+ 4.1 11/22/2023 - non pitting edema today     Family/ staff Communication: plan discussed with patient and nurse  Labs/tests ordered: none

## 2023-11-29 ENCOUNTER — Non-Acute Institutional Stay: Payer: Self-pay | Admitting: Internal Medicine

## 2023-11-29 ENCOUNTER — Encounter: Payer: Self-pay | Admitting: Internal Medicine

## 2023-11-29 DIAGNOSIS — S81812D Laceration without foreign body, left lower leg, subsequent encounter: Secondary | ICD-10-CM | POA: Diagnosis not present

## 2023-11-29 DIAGNOSIS — R062 Wheezing: Secondary | ICD-10-CM

## 2023-11-29 DIAGNOSIS — R052 Subacute cough: Secondary | ICD-10-CM

## 2023-11-29 DIAGNOSIS — R600 Localized edema: Secondary | ICD-10-CM

## 2023-11-29 MED ORDER — TORSEMIDE 20 MG PO TABS: 20.0000 mg | ORAL_TABLET | Freq: Every day | ORAL | Status: DC

## 2023-11-29 MED ORDER — OMEPRAZOLE 20 MG PO CPDR
20.0000 mg | DELAYED_RELEASE_CAPSULE | Freq: Every day | ORAL | Status: DC
Start: 2023-11-29 — End: 2024-01-15

## 2023-11-29 NOTE — Progress Notes (Signed)
 Location:  [facility] Room Number: N14-A [name]:  [id number] [phone]) Provider:  Charlanne Fredia CROME, MD  Patient Care Team: Charlanne Fredia CROME, MD as PCP - General (Internal Medicine) Court Dorn PARAS, MD as PCP - Cardiology (Cardiology) Celestia Agent, MD (Inactive) as Consulting Physician (Gastroenterology) Nicholaus Tanda CROME DOUGLAS, MD as Attending Physician (Urology) Court Dorn PARAS, MD as Consulting Physician (Cardiology) Ethyl Lonni BRAVO, MD (Inactive) as Consulting Physician (Otolaryngology) Clare Senior, DDS (Dentistry) Rosan Credit, MD as Consulting Physician (Ophthalmology) Shari Easter, MD as Consulting Physician (Orthopedic Surgery) Tanda Locus, MD as Consulting Physician (General Surgery)  Extended Emergency Contact Information Primary Emergency Contact: Zerbe,Don Address: [address] [place] [address] [place] Home Phone: [phone] Mobile Phone: [phone] Relation: Son Secondary Emergency Contact: Bertrum CHRISTELLA Gibson Address: [address]          [place], [place] [address] [place] Home Phone: [phone] Work Phone: [phone] Mobile Phone: [phone] Relation: Daughter  Code Status:  DNR Goals of care: Advanced Directive information    11/29/2023   10:21 AM  Advanced Directives  Does Patient Have a Medical Advance Directive? Yes  Type of Estate agent of [place];Living will;Out of facility DNR (pink MOST or yellow form)  Does patient want to make changes to medical advance directive? No - Patient declined  Copy of Healthcare Power of Attorney in Chart? Yes - validated most recent copy scanned in chart (See row information)  Pre-existing out of facility DNR order (yellow form or pink MOST form) Yellow form placed in chart (order not valid for inpatient use)     Chief Complaint  Patient presents with   Acute Visit    HPI:  Pt is a 89 y.o. female  seen today for an acute visit for Continues Cough and LE edema with Wheezing  Patient has been having Cough since 10/12/2023 It is off an on Treated with Antibiotics , Prednisone Xray negative BNP slighlty elevated at 130 She is Also on Lasix  She also has Skin tear in her Leg which is Slowly healing due to Edema  No fever or chest pain Does feel SOB when she is walking with OT Wt Readings from Last 3 Encounters:  11/29/23 190 lb 12.8 oz (86.5 kg)  11/28/23 190 lb 12.8 oz (86.5 kg)  11/19/23 175 lb 1.6 oz (79.4 kg)       Past Medical History:  Diagnosis Date   Cervicalgia    Chest pain at rest 01/07/2012   Displacement of cervical intervertebral disc without myelopathy    Diverticulosis of colon (without mention of hemorrhage)    Female stress incontinence    GERD (gastroesophageal reflux disease)    no peds occasional pepcid   Inguinal hernia without mention of obstruction or gangrene, unilateral or unspecified, (not specified as recurrent)    Internal hemorrhoids without mention of complication    Lumbago    Macular degeneration (senile) of retina, unspecified    Osteoarthrosis, unspecified whether generalized or localized, unspecified site    Other and unspecified hyperlipidemia    Pain in joint, hand    Palpitations    Reflux esophagitis    Scoliosis (and kyphoscoliosis), idiopathic    Senile osteoporosis    Skin disorder    Thyroid  disease    TIA (transient ischemic attack)    Unruptured popliteal cyst 06/24/2014   Right knee    Unspecified essential hypertension  Unspecified glaucoma(365.9)    Unspecified hypothyroidism    Unspecified vitamin D  deficiency    Past Surgical History:  Procedure Laterality Date   ABDOMINAL HYSTERECTOMY  1975   Dr Sharron   APPENDECTOMY  1988   cardiolite myocardial perfusion study     DOPPLER ECHOCARDIOGRAPHY     EYE SURGERY Bilateral 2009   cataract removed right eye, Dr Carrie   FOOT SURGERY  august 2013   Hewitt, MD    INGUINAL HERNIA REPAIR Bilateral    w/mesh   INGUINAL HERNIA REPAIR Left 08/03/2017   Procedure: LAPAROSCOPIC LEFT INGUINAL HERNIA REPAIR WITH MESH;  Surgeon: Tanda Locus, MD;  Location: [facility] OR;  Service: General;  Laterality: Left;   INGUINAL HERNIA REPAIR Right 08/03/2017   Procedure: HERNIA REPAIR RIGHT INGUINAL ADULT WITH MESH;  Surgeon: Tanda Locus, MD;  Location: [facility] OR;  Service: General;  Laterality: Right;   INGUINAL HERNIA REPAIR     Dr. Tanda 04-24-18   INGUINAL HERNIA REPAIR Right 04/24/2018   Procedure: DIAGNOSTIC LAPAROSCOPIC, OPENREPAIR OF RECURRENT RIGHT INGUINAL HERNIA WITH MESH ERAS PATHWAY;  Surgeon: Tanda Locus, MD;  Location: [facility] ORS;  Service: General;  Laterality: Right;   INSERTION OF MESH Bilateral 08/03/2017   Procedure: INSERTION OF MESH;  Surgeon: Tanda Locus, MD;  Location: [facility] OR;  Service: General;  Laterality: Bilateral;   NM MYOVIEW  LTD     negative   TONSILLECTOMY  1937   TOOTH EXTRACTION  09/16/13   Dr Maryjean    Allergies  Allergen Reactions   Trimethoprim Other (See Comments)    Headaches/ ear pressure    Erythromycin Other (See Comments)    UNSPECIFIED REACTION    Macrodantin [Nitrofurantoin Macrocrystal] Other (See Comments)    UNSPECIFIED REACTION    Other     Honeydew Melon   Penicillins Other (See Comments)    UNSPECIFIED REACTION  Has patient had a PCN reaction causing immediate rash, facial/tongue/throat swelling, SOB or lightheadedness with hypotension: Unknown Has patient had a PCN reaction causing severe rash involving mucus membranes or skin necrosis: Unknown Has patient had a PCN reaction that required hospitalization: No Has patient had a PCN reaction occurring within the last 10 years: No If all of the above answers are NO, then may proceed with Cephalosporin use.   Sulfa Antibiotics Other (See Comments)    UNSPECIFIED REACTION    Latex Itching    Outpatient Encounter Medications as of 11/29/2023  Medication Sig    acetaminophen  (TYLENOL ) 500 MG tablet Take 500 mg by mouth 2 (two) times daily as needed for mild pain or headache.    acetaminophen  (TYLENOL ) 500 MG tablet Take 1,000 mg by mouth at bedtime. (Patient taking differently: Take 1,000 mg by mouth in the morning and at bedtime. Every morning and at bedtime for pain)   albuterol  (VENTOLIN  HFA) 108 (90 Base) MCG/ACT inhaler Inhale 2 puffs into the lungs every 6 (six) hours as needed for wheezing or shortness of breath.   cephALEXin  (KEFLEX ) 250 MG capsule Take 250 mg by mouth daily.   Cholecalciferol  (VITAMIN D3) 50 MCG (2000 UT) TABS Take 50 mcg by mouth daily.   clopidogrel  (PLAVIX ) 75 MG tablet TAKE ONE TABLET BY MOUTH DAILY   Cranberry 500 MG TABS Take 1 tablet by mouth daily.   furosemide  (LASIX ) 20 MG tablet Take 1 tablet (20 mg total) by mouth daily.   ipratropium-albuterol  (DUONEB) 0.5-2.5 (3) MG/3ML SOLN Inhale 3 mLs into the lungs every 12 (twelve) hours as needed.  ipratropium-albuterol  (DUONEB) 0.5-2.5 (3) MG/3ML SOLN Take 3 mLs by nebulization 2 (two) times daily.   Menthol, Topical Analgesic, (BIOFREEZE ROLL-ON) 4 % GEL Apply 1 Application topically in the morning and at bedtime. Apply to right upper arm   Menthol, Topical Analgesic, (BIOFREEZE ROLL-ON) 4 % GEL Apply 1 application  topically 2 (two) times daily. Apply to right knee   metoprolol  succinate (TOPROL -XL) 25 MG 24 hr tablet Take one tablet by mouth once daily to regulate heart and control blood pressure.   Multiple Vitamins-Minerals (PRESERVISION AREDS 2 PO) Take 1 capsule by mouth 2 (two) times daily.    potassium chloride  (KLOR-CON ) 10 MEQ tablet Take 10 mEq by mouth every morning.   vitamin C (ASCORBIC ACID) 500 MG tablet Take 500 mg by mouth daily with supper.    Zinc Oxide 10 % OINT Apply 1 Application topically as needed.   No facility-administered encounter medications on file as of 11/29/2023.    Review of Systems  Constitutional:  Positive for activity change and  unexpected weight change. Negative for appetite change.  HENT: Negative.    Respiratory:  Positive for cough, shortness of breath and wheezing.   Cardiovascular:  Negative for leg swelling.  Gastrointestinal:  Negative for constipation.  Genitourinary: Negative.   Musculoskeletal:  Positive for gait problem. Negative for arthralgias and myalgias.  Skin: Negative.   Neurological:  Negative for dizziness and weakness.  Psychiatric/Behavioral:  Positive for confusion. Negative for dysphoric mood and sleep disturbance.     Immunization History  Administered Date(s) Administered   Fluad Quad(high Dose 65+) 02/04/2019, 03/01/2022   Influenza Whole 02/24/2011, 02/12/2012   Influenza, High Dose Seasonal PF 02/01/2017, 02/25/2018, 02/11/2020, 03/13/2023   Influenza,inj,Quad PF,6+ Mos 02/20/2013, 02/10/2014, 02/19/2015, 02/18/2016   Influenza-Unspecified 02/16/2021   Moderna Covid-19 Fall Seasonal Vaccine 46yrs & older 02/09/2022, 03/21/2023   Moderna Sars-Covid-2 Vaccination 05/19/2019, 06/16/2019, 03/29/2020, 09/27/2020   PFIZER(Purple Top)SARS-COV-2 Vaccination 02/02/2021   Pneumococcal Conjugate-13 06/24/2014   Pneumococcal Polysaccharide-23 04/12/1998   Td 02/21/1996, 07/23/2003   Tdap 03/20/2010   Zoster Recombinant(Shingrix) 09/19/2016, 12/02/2016   Zoster, Live 09/15/2005   Pertinent  Health Maintenance Due  Topic Date Due   INFLUENZA VACCINE  12/14/2023   DEXA SCAN  Completed      09/06/2022    2:27 PM 12/27/2022    2:15 PM 04/18/2023    4:49 PM 08/27/2023    2:51 PM 09/14/2023   12:18 PM  Fall Risk  Falls in the past year? 1 0 0 1 1  Was there an injury with Fall? 1 0 0 0 0  Fall Risk Category Calculator 2 0 0 1 1  Patient at Risk for Falls Due to History of fall(s);Impaired balance/gait;Impaired mobility No Fall Risks History of fall(s);Impaired balance/gait;Impaired mobility History of fall(s);Impaired mobility History of fall(s);Impaired balance/gait  Fall risk Follow up  Falls evaluation completed;Education provided;Falls prevention discussed Falls evaluation completed;Education provided;Falls prevention discussed Falls evaluation completed;Education provided;Falls prevention discussed Falls evaluation completed;Education provided Falls evaluation completed;Education provided   Functional Status Survey:    Vitals:   11/29/23 1005 11/29/23 1006  BP: (!) 165/80 (!) 160/80  Pulse: 79   Temp: 97.6 F (36.4 C)   SpO2: 96%   Weight: 190 lb 12.8 oz (86.5 kg)   Height: 5' 2 (1.575 m)    Body mass index is 34.9 kg/m. Physical Exam Vitals reviewed.  Constitutional:      Appearance: Normal appearance.  HENT:     Head: Normocephalic.  Nose: Nose normal.     Mouth/Throat:     Mouth: Mucous membranes are moist.     Pharynx: Oropharynx is clear.  Eyes:     Pupils: Pupils are equal, round, and reactive to light.  Cardiovascular:     Rate and Rhythm: Normal rate and regular rhythm.     Pulses: Normal pulses.     Heart sounds: Normal heart sounds. No murmur heard. Pulmonary:     Effort: Pulmonary effort is normal.     Breath sounds: Wheezing and rales present.  Abdominal:     General: Abdomen is flat. Bowel sounds are normal.     Palpations: Abdomen is soft.  Musculoskeletal:        General: Swelling present.     Cervical back: Neck supple.  Skin:    General: Skin is warm.  Neurological:     General: No focal deficit present.     Mental Status: She is alert and oriented to person, place, and time.  Psychiatric:        Mood and Affect: Mood normal.        Thought Content: Thought content normal.     Labs reviewed: Recent Labs    03/05/23 0000 10/15/23 0000 11/22/23 0000  NA 140 141 140  K 4.2 4.2 4.1  CL 105 106 105  CO2 27* 27* 27*  BUN 24* 24* 19  CREATININE 0.6 0.6 0.6  CALCIUM  8.8 8.9 8.7   Recent Labs    03/05/23 0000 10/15/23 0000  AST 14 13  ALT 16 13  ALKPHOS 71 54  ALBUMIN 3.4* 3.5   Recent Labs    03/05/23 0000  10/15/23 0000  WBC 7.4 6.9  NEUTROABS 4,151.00 3,436.00  HGB 12.2 11.2*  HCT 37 34*  PLT 219 221   Lab Results  Component Value Date   TSH 3.20 10/31/2022   Lab Results  Component Value Date   HGBA1C 5.3 05/02/2013   Lab Results  Component Value Date   CHOL 188 07/10/2019   HDL 75 07/10/2019   LDLCALC 94 07/10/2019   TRIG 96 07/10/2019   CHOLHDL 2.5 07/10/2019    Significant Diagnostic Results in last 30 days:  No results found.  Assessment/Plan 1. Subacute cough (Primary) Will start her on Budesonide Neb BID for 2 weeks and reval Continue Duo Nebs PRN Also Restart her on Prilosec 20 mg QD 2. Wheezing Will Try Budesonide  3. Bilateral leg edema With Weight gain Will Start her on Torsemide  Discontinue Lasix   4. Noninfected skin tear of left lower extremity, subsequent encounter Getting Hydrafera Blue dressing  Family/ staff Communication:   Labs/tests ordered:  BMP in 1 week

## 2023-11-30 ENCOUNTER — Encounter: Payer: Self-pay | Admitting: Orthopedic Surgery

## 2023-11-30 ENCOUNTER — Non-Acute Institutional Stay: Payer: Self-pay | Admitting: Orthopedic Surgery

## 2023-11-30 DIAGNOSIS — R278 Other lack of coordination: Secondary | ICD-10-CM | POA: Diagnosis not present

## 2023-11-30 DIAGNOSIS — M25552 Pain in left hip: Secondary | ICD-10-CM

## 2023-11-30 DIAGNOSIS — M545 Low back pain, unspecified: Secondary | ICD-10-CM | POA: Diagnosis not present

## 2023-11-30 DIAGNOSIS — M6281 Muscle weakness (generalized): Secondary | ICD-10-CM | POA: Diagnosis not present

## 2023-11-30 DIAGNOSIS — R262 Difficulty in walking, not elsewhere classified: Secondary | ICD-10-CM | POA: Diagnosis not present

## 2023-11-30 NOTE — Progress Notes (Signed)
 Location:  [facility] Room Number: N14-A [name]:  ALF [phone]) Provider:  Gil Greig FORBES [name] Charlanne Fredia LITTIE, MD  Patient Care Team: Charlanne Fredia LITTIE, MD as PCP - General (Internal Medicine) Court Dorn PARAS, MD as PCP - Cardiology (Cardiology) Celestia Agent, MD (Inactive) as Consulting Physician (Gastroenterology) Nicholaus Tanda LITTIE DOUGLAS, MD as Attending Physician (Urology) Court Dorn PARAS, MD as Consulting Physician (Cardiology) Ethyl Lonni FORBES, MD (Inactive) as Consulting Physician (Otolaryngology) Clare Senior, DDS (Dentistry) Rosan Credit, MD as Consulting Physician (Ophthalmology) Shari Easter, MD as Consulting Physician (Orthopedic Surgery) Tanda Locus, MD as Consulting Physician (General Surgery)  Extended Emergency Contact Information Primary Emergency Contact: Kaster,Don Address: [address] [address] [place] Home Phone: [phone] Mobile Phone: [phone] Relation: Son Secondary Emergency Contact: Bertrum CHRISTELLA Gibson Address: [address]          [place], [place] [address] [place] Home Phone: [phone] Work Phone: [phone] Mobile Phone: [phone] Relation: Daughter  Code Status:  DNR Goals of care: Advanced Directive information    11/30/2023    2:38 PM  Advanced Directives  Does Patient Have a Medical Advance Directive? Yes  Type of Estate agent of [place];Out of facility DNR (pink MOST or yellow form);Living will  Does patient want to make changes to medical advance directive? No - Patient declined  Copy of Healthcare Power of Attorney in Chart? Yes - validated most recent copy scanned in chart (See row information)  Pre-existing out of facility DNR order (yellow form or pink MOST form) Pink MOST/Yellow Form most recent copy in chart - Physician notified to receive inpatient order     Chief Complaint  Patient presents with    Acute Visit    Fall with leg pain.    HPI:  Pt is a 89 y.o. female seen today for acute visit due to fall.   She currently resides on the skilled nursing unit at [facility]. PMH: hypertension, hyperlipidemia, macular degeneration, mild cognitive impairment, recurrent UTI, GERD, H/o TIA.   She had unwitnessed fall. She denied hitting head. She reports hitting left side and having left leg/lower back pain after incident. She was able to stand after fall and get back into wheelchair. Appropriate wit y questions. Nursing gave her tylenol  prn. Denies improvement in pain. She is taking plavix  for h/o TIA. Vitals stable.      Past Medical History:  Diagnosis Date   Cervicalgia    Chest pain at rest 01/07/2012   Displacement of cervical intervertebral disc without myelopathy    Diverticulosis of colon (without mention of hemorrhage)    Female stress incontinence    GERD (gastroesophageal reflux disease)    no peds occasional pepcid   Inguinal hernia without mention of obstruction or gangrene, unilateral or unspecified, (not specified as recurrent)    Internal hemorrhoids without mention of complication    Lumbago    Macular degeneration (senile) of retina, unspecified    Osteoarthrosis, unspecified whether generalized or localized, unspecified site    Other and unspecified hyperlipidemia    Pain in joint, hand    Palpitations    Reflux esophagitis    Scoliosis (and kyphoscoliosis), idiopathic    Senile osteoporosis    Skin disorder    Thyroid  disease    TIA (transient ischemic attack)    Unruptured popliteal cyst 06/24/2014   Right knee    Unspecified essential hypertension  Unspecified glaucoma(365.9)    Unspecified hypothyroidism    Unspecified vitamin D  deficiency    Past Surgical History:  Procedure Laterality Date   ABDOMINAL HYSTERECTOMY  1975   Dr Sharron   APPENDECTOMY  1988   cardiolite myocardial perfusion study     DOPPLER ECHOCARDIOGRAPHY     EYE SURGERY  Bilateral 2009   cataract removed right eye, Dr Carrie   FOOT SURGERY  august 2013   Hewitt, MD   INGUINAL HERNIA REPAIR Bilateral    w/mesh   INGUINAL HERNIA REPAIR Left 08/03/2017   Procedure: LAPAROSCOPIC LEFT INGUINAL HERNIA REPAIR WITH MESH;  Surgeon: Tanda Locus, MD;  Location: [facility] OR;  Service: General;  Laterality: Left;   INGUINAL HERNIA REPAIR Right 08/03/2017   Procedure: HERNIA REPAIR RIGHT INGUINAL ADULT WITH MESH;  Surgeon: Tanda Locus, MD;  Location: [facility] OR;  Service: General;  Laterality: Right;   INGUINAL HERNIA REPAIR     Dr. Tanda 04-24-18   INGUINAL HERNIA REPAIR Right 04/24/2018   Procedure: DIAGNOSTIC LAPAROSCOPIC, OPENREPAIR OF RECURRENT RIGHT INGUINAL HERNIA WITH MESH ERAS PATHWAY;  Surgeon: Tanda Locus, MD;  Location: [facility] ORS;  Service: General;  Laterality: Right;   INSERTION OF MESH Bilateral 08/03/2017   Procedure: INSERTION OF MESH;  Surgeon: Tanda Locus, MD;  Location: [facility] OR;  Service: General;  Laterality: Bilateral;   NM MYOVIEW  LTD     negative   TONSILLECTOMY  1937   TOOTH EXTRACTION  09/16/13   Dr Maryjean    Allergies  Allergen Reactions   Trimethoprim Other (See Comments)    Headaches/ ear pressure    Erythromycin Other (See Comments)    UNSPECIFIED REACTION    Macrodantin [Nitrofurantoin Macrocrystal] Other (See Comments)    UNSPECIFIED REACTION    Other     Honeydew Melon   Penicillins Other (See Comments)    UNSPECIFIED REACTION  Has patient had a PCN reaction causing immediate rash, facial/tongue/throat swelling, SOB or lightheadedness with hypotension: Unknown Has patient had a PCN reaction causing severe rash involving mucus membranes or skin necrosis: Unknown Has patient had a PCN reaction that required hospitalization: No Has patient had a PCN reaction occurring within the last 10 years: No If all of the above answers are NO, then may proceed with Cephalosporin use.   Sulfa Antibiotics Other (See Comments)    UNSPECIFIED REACTION     Latex Itching    Outpatient Encounter Medications as of 11/30/2023  Medication Sig   acetaminophen  (TYLENOL ) 500 MG tablet Take 500 mg by mouth 2 (two) times daily as needed for mild pain or headache.    acetaminophen  (TYLENOL ) 500 MG tablet Take 1,000 mg by mouth. Give 1000 mg by mouth every morning and at bedtime for pain.   albuterol  (VENTOLIN  HFA) 108 (90 Base) MCG/ACT inhaler Inhale 2 puffs into the lungs every 6 (six) hours as needed for wheezing or shortness of breath.   budesonide (PULMICORT) 0.5 MG/2ML nebulizer solution Inhale 0.5 mg into the lungs 2 (two) times daily.   cephALEXin  (KEFLEX ) 250 MG capsule Take 250 mg by mouth daily.   Cholecalciferol  (VITAMIN D3) 50 MCG (2000 UT) TABS Take 50 mcg by mouth daily.   clopidogrel  (PLAVIX ) 75 MG tablet TAKE ONE TABLET BY MOUTH DAILY   Cranberry 500 MG TABS Take 1 tablet by mouth daily.   GUAIFENESIN  PO Take by mouth. Give 10 ml by mouth every 6 hours as needed for Cough for 2 Days Notify MD is continued cough   ipratropium-albuterol  (  DUONEB) 0.5-2.5 (3) MG/3ML SOLN Inhale 3 mLs into the lungs every 12 (twelve) hours as needed.   LORazepam (ATIVAN) 0.5 MG tablet Take 0.5 mg by mouth every 12 (twelve) hours.   LORazepam (ATIVAN) 0.5 MG tablet Take 0.5 mg by mouth every 12 (twelve) hours.   Menthol, Topical Analgesic, (BIOFREEZE ROLL-ON) 4 % GEL Apply 1 Application topically in the morning and at bedtime. Apply to right upper arm   Menthol, Topical Analgesic, (BIOFREEZE ROLL-ON) 4 % GEL Apply 1 application  topically 2 (two) times daily. Apply to right knee   metoprolol  succinate (TOPROL -XL) 25 MG 24 hr tablet Take one tablet by mouth once daily to regulate heart and control blood pressure.   Multiple Vitamins-Minerals (PRESERVISION AREDS 2 PO) Take 1 capsule by mouth 2 (two) times daily.    omeprazole  (PRILOSEC) 20 MG capsule Take 1 capsule (20 mg total) by mouth daily.   potassium chloride  (KLOR-CON ) 10 MEQ tablet Take 20 mEq by mouth  every morning.   torsemide  (DEMADEX ) 20 MG tablet Take 1 tablet (20 mg total) by mouth daily.   vitamin C (ASCORBIC ACID) 500 MG tablet Take 500 mg by mouth daily with supper.    Zinc Oxide 10 % OINT Apply 1 Application topically as needed.   ipratropium-albuterol  (DUONEB) 0.5-2.5 (3) MG/3ML SOLN Take 3 mLs by nebulization 2 (two) times daily. (Patient not taking: Reported on 11/30/2023)   No facility-administered encounter medications on file as of 11/30/2023.    Review of Systems  Constitutional: Negative.   HENT: Negative.    Respiratory: Negative.    Cardiovascular: Negative.   Gastrointestinal: Negative.   Genitourinary: Negative.   Musculoskeletal:  Positive for arthralgias, back pain and gait problem.  Psychiatric/Behavioral:  Negative for confusion and dysphoric mood. The patient is not nervous/anxious.     Immunization History  Administered Date(s) Administered   Fluad Quad(high Dose 65+) 02/04/2019, 03/01/2022   Influenza Whole 02/24/2011, 02/12/2012   Influenza, High Dose Seasonal PF 02/01/2017, 02/25/2018, 02/11/2020, 03/13/2023   Influenza,inj,Quad PF,6+ Mos 02/20/2013, 02/10/2014, 02/19/2015, 02/18/2016   Influenza-Unspecified 02/16/2021   Moderna Covid-19 Fall Seasonal Vaccine 76yrs & older 02/09/2022, 03/21/2023   Moderna Sars-Covid-2 Vaccination 05/19/2019, 06/16/2019, 03/29/2020, 09/27/2020   PFIZER(Purple Top)SARS-COV-2 Vaccination 02/02/2021   Pneumococcal Conjugate-13 06/24/2014   Pneumococcal Polysaccharide-23 04/12/1998   Td 02/21/1996, 07/23/2003   Tdap 03/20/2010   Zoster Recombinant(Shingrix) 09/19/2016, 12/02/2016   Zoster, Live 09/15/2005   Pertinent  Health Maintenance Due  Topic Date Due   INFLUENZA VACCINE  12/14/2023   DEXA SCAN  Completed      09/06/2022    2:27 PM 12/27/2022    2:15 PM 04/18/2023    4:49 PM 08/27/2023    2:51 PM 09/14/2023   12:18 PM  Fall Risk  Falls in the past year? 1 0 0 1 1  Was there an injury with Fall? 1 0 0 0 0   Fall Risk Category Calculator 2 0 0 1 1  Patient at Risk for Falls Due to History of fall(s);Impaired balance/gait;Impaired mobility No Fall Risks History of fall(s);Impaired balance/gait;Impaired mobility History of fall(s);Impaired mobility History of fall(s);Impaired balance/gait  Fall risk Follow up Falls evaluation completed;Education provided;Falls prevention discussed Falls evaluation completed;Education provided;Falls prevention discussed Falls evaluation completed;Education provided;Falls prevention discussed Falls evaluation completed;Education provided Falls evaluation completed;Education provided   Functional Status Survey:    Vitals:   11/30/23 1435 11/30/23 1436  BP: (!) 165/80 (!) 154/81  Pulse: 79   Resp: 15   Temp: 97.6  F (36.4 C)   SpO2: 96%   Weight: 191 lb (86.6 kg)   Height: 5' 2 (1.575 m)    Body mass index is 34.93 kg/m. Physical Exam Vitals reviewed.  Constitutional:      General: She is not in acute distress. HENT:     Head: Normocephalic.  Eyes:     General:        Right eye: No discharge.        Left eye: No discharge.  Cardiovascular:     Rate and Rhythm: Normal rate and regular rhythm.     Pulses: Normal pulses.     Heart sounds: Normal heart sounds.  Pulmonary:     Effort: Pulmonary effort is normal.     Breath sounds: Normal breath sounds.  Abdominal:     General: Bowel sounds are normal.     Palpations: Abdomen is soft.  Musculoskeletal:     Cervical back: Neck supple.     Comments: Mild pain with left leg lift, LLE no internal/external rotation, WBAT, tenderness to left lumbar/sciatic region  Skin:    General: Skin is warm.     Capillary Refill: Capillary refill takes less than 2 seconds.  Neurological:     General: No focal deficit present.     Mental Status: She is alert. Mental status is at baseline.  Psychiatric:        Mood and Affect: Mood normal.     Labs reviewed: Recent Labs    03/05/23 0000 10/15/23 0000  11/22/23 0000  NA 140 141 140  K 4.2 4.2 4.1  CL 105 106 105  CO2 27* 27* 27*  BUN 24* 24* 19  CREATININE 0.6 0.6 0.6  CALCIUM  8.8 8.9 8.7   Recent Labs    03/05/23 0000 10/15/23 0000  AST 14 13  ALT 16 13  ALKPHOS 71 54  ALBUMIN 3.4* 3.5   Recent Labs    03/05/23 0000 10/15/23 0000  WBC 7.4 6.9  NEUTROABS 4,151.00 3,436.00  HGB 12.2 11.2*  HCT 37 34*  PLT 219 221   Lab Results  Component Value Date   TSH 3.20 10/31/2022   Lab Results  Component Value Date   HGBA1C 5.3 05/02/2013   Lab Results  Component Value Date   CHOL 188 07/10/2019   HDL 75 07/10/2019   LDLCALC 94 07/10/2019   TRIG 96 07/10/2019   CHOLHDL 2.5 07/10/2019    Significant Diagnostic Results in last 30 days:  No results found.  Assessment/Plan 1. Left hip pain (Primary) - unwitnessed fall  - mild pain with left leg lift, no internal/external rotation, WBAT - xray left hip and pelvis - cont tylenol  scheduled and prn - consider lidocaine  patches for pain  2. Acute left-sided low back pain without sciatica - see above - mild tenderness to left side> lumbar/sciatic region - xray thoracic and lumbar spine    Family/ staff Communication: plan discussed with patient, daughter and nurse  Labs/tests ordered:  xray left hip/pelvis, thoracic and lumbar spine

## 2023-12-01 ENCOUNTER — Emergency Department (HOSPITAL_COMMUNITY)

## 2023-12-01 ENCOUNTER — Encounter (HOSPITAL_COMMUNITY): Payer: Self-pay | Admitting: Emergency Medicine

## 2023-12-01 ENCOUNTER — Emergency Department (HOSPITAL_COMMUNITY)
Admission: EM | Admit: 2023-12-01 | Discharge: 2023-12-02 | Disposition: A | Discharge status: Skilled Nursing Facility | Attending: Emergency Medicine | Admitting: Emergency Medicine

## 2023-12-01 ENCOUNTER — Other Ambulatory Visit: Payer: Self-pay

## 2023-12-01 DIAGNOSIS — M47816 Spondylosis without myelopathy or radiculopathy, lumbar region: Secondary | ICD-10-CM | POA: Diagnosis not present

## 2023-12-01 DIAGNOSIS — W050XXA Fall from non-moving wheelchair, initial encounter: Secondary | ICD-10-CM | POA: Diagnosis not present

## 2023-12-01 DIAGNOSIS — Z043 Encounter for examination and observation following other accident: Secondary | ICD-10-CM | POA: Diagnosis not present

## 2023-12-01 DIAGNOSIS — S199XXA Unspecified injury of neck, initial encounter: Secondary | ICD-10-CM | POA: Diagnosis not present

## 2023-12-01 DIAGNOSIS — Z91040 Latex allergy status: Secondary | ICD-10-CM | POA: Diagnosis not present

## 2023-12-01 DIAGNOSIS — S3992XA Unspecified injury of lower back, initial encounter: Secondary | ICD-10-CM | POA: Diagnosis not present

## 2023-12-01 DIAGNOSIS — M546 Pain in thoracic spine: Secondary | ICD-10-CM | POA: Diagnosis not present

## 2023-12-01 DIAGNOSIS — K4090 Unilateral inguinal hernia, without obstruction or gangrene, not specified as recurrent: Secondary | ICD-10-CM | POA: Diagnosis not present

## 2023-12-01 DIAGNOSIS — N390 Urinary tract infection, site not specified: Secondary | ICD-10-CM | POA: Insufficient documentation

## 2023-12-01 DIAGNOSIS — I672 Cerebral atherosclerosis: Secondary | ICD-10-CM | POA: Diagnosis not present

## 2023-12-01 DIAGNOSIS — Z7902 Long term (current) use of antithrombotics/antiplatelets: Secondary | ICD-10-CM | POA: Insufficient documentation

## 2023-12-01 DIAGNOSIS — R519 Headache, unspecified: Secondary | ICD-10-CM | POA: Insufficient documentation

## 2023-12-01 DIAGNOSIS — I10 Essential (primary) hypertension: Secondary | ICD-10-CM | POA: Diagnosis not present

## 2023-12-01 DIAGNOSIS — S0990XA Unspecified injury of head, initial encounter: Secondary | ICD-10-CM | POA: Diagnosis not present

## 2023-12-01 DIAGNOSIS — M542 Cervicalgia: Secondary | ICD-10-CM | POA: Diagnosis not present

## 2023-12-01 DIAGNOSIS — I6782 Cerebral ischemia: Secondary | ICD-10-CM | POA: Diagnosis not present

## 2023-12-01 DIAGNOSIS — M419 Scoliosis, unspecified: Secondary | ICD-10-CM | POA: Diagnosis not present

## 2023-12-01 DIAGNOSIS — M47812 Spondylosis without myelopathy or radiculopathy, cervical region: Secondary | ICD-10-CM | POA: Diagnosis not present

## 2023-12-01 DIAGNOSIS — M545 Low back pain, unspecified: Secondary | ICD-10-CM | POA: Insufficient documentation

## 2023-12-01 DIAGNOSIS — Z8673 Personal history of transient ischemic attack (TIA), and cerebral infarction without residual deficits: Secondary | ICD-10-CM | POA: Diagnosis not present

## 2023-12-01 DIAGNOSIS — W19XXXA Unspecified fall, initial encounter: Secondary | ICD-10-CM

## 2023-12-01 DIAGNOSIS — R102 Pelvic and perineal pain: Secondary | ICD-10-CM | POA: Diagnosis not present

## 2023-12-01 DIAGNOSIS — M549 Dorsalgia, unspecified: Secondary | ICD-10-CM | POA: Diagnosis not present

## 2023-12-01 DIAGNOSIS — R600 Localized edema: Secondary | ICD-10-CM | POA: Insufficient documentation

## 2023-12-01 DIAGNOSIS — M25552 Pain in left hip: Secondary | ICD-10-CM | POA: Diagnosis not present

## 2023-12-01 DIAGNOSIS — R918 Other nonspecific abnormal finding of lung field: Secondary | ICD-10-CM | POA: Diagnosis not present

## 2023-12-01 LAB — URINALYSIS, ROUTINE W REFLEX MICROSCOPIC
Bilirubin Urine: NEGATIVE
Glucose, UA: NEGATIVE mg/dL
Hgb urine dipstick: NEGATIVE
Ketones, ur: NEGATIVE mg/dL
Nitrite: NEGATIVE
Protein, ur: NEGATIVE mg/dL
Specific Gravity, Urine: 1.011 (ref 1.005–1.030)
WBC, UA: >50 WBC/hpf (ref 0–5)
pH: 6 (ref 5.0–8.0)

## 2023-12-01 LAB — CBC WITH DIFFERENTIAL/PLATELET
Abs Immature Granulocytes: 0.04 K/uL (ref 0.00–0.07)
Basophils Absolute: 0.1 K/uL (ref 0.0–0.1)
Basophils Relative: 1 %
Eosinophils Absolute: 0.2 K/uL (ref 0.0–0.5)
Eosinophils Relative: 2 %
HCT: 41.7 % (ref 36.0–46.0)
Hemoglobin: 13.4 g/dL (ref 12.0–15.0)
Immature Granulocytes: 0 %
Lymphocytes Relative: 18 %
Lymphs Abs: 1.8 K/uL (ref 0.7–4.0)
MCH: 30.7 pg (ref 26.0–34.0)
MCHC: 32.1 g/dL (ref 30.0–36.0)
MCV: 95.6 fL (ref 80.0–100.0)
Monocytes Absolute: 1.1 K/uL — ABNORMAL HIGH (ref 0.1–1.0)
Monocytes Relative: 11 %
Neutro Abs: 6.7 K/uL (ref 1.7–7.7)
Neutrophils Relative %: 68 %
Platelets: 218 K/uL (ref 150–400)
RBC: 4.36 MIL/uL (ref 3.87–5.11)
RDW: 16.2 % — ABNORMAL HIGH (ref 11.5–15.5)
WBC: 10 K/uL (ref 4.0–10.5)
nRBC: 0 % (ref 0.0–0.2)

## 2023-12-01 LAB — BRAIN NATRIURETIC PEPTIDE: B Natriuretic Peptide: 181.2 pg/mL — ABNORMAL HIGH (ref 0.0–100.0)

## 2023-12-01 LAB — I-STAT VENOUS BLOOD GAS, ED
Acid-Base Excess: 6 mmol/L — ABNORMAL HIGH (ref 0.0–2.0)
Bicarbonate: 29.2 mmol/L — ABNORMAL HIGH (ref 20.0–28.0)
Calcium, Ion: 1.13 mmol/L — ABNORMAL LOW (ref 1.15–1.40)
HCT: 41 % (ref 36.0–46.0)
Hemoglobin: 13.9 g/dL (ref 12.0–15.0)
O2 Saturation: 90 %
Potassium: 4.1 mmol/L (ref 3.5–5.1)
Sodium: 137 mmol/L (ref 135–145)
TCO2: 30 mmol/L (ref 22–32)
pCO2, Ven: 36.7 mmHg — ABNORMAL LOW (ref 44–60)
pH, Ven: 7.508 — ABNORMAL HIGH (ref 7.25–7.43)
pO2, Ven: 54 mmHg — ABNORMAL HIGH (ref 32–45)

## 2023-12-01 LAB — COMPREHENSIVE METABOLIC PANEL WITH GFR
ALT: 17 U/L (ref 0–44)
AST: 22 U/L (ref 15–41)
Albumin: 3.8 g/dL (ref 3.5–5.0)
Alkaline Phosphatase: 53 U/L (ref 38–126)
Anion gap: 14 (ref 5–15)
BUN: 17 mg/dL (ref 8–23)
CO2: 26 mmol/L (ref 22–32)
Calcium: 9.5 mg/dL (ref 8.9–10.3)
Chloride: 99 mmol/L (ref 98–111)
Creatinine, Ser: 0.8 mg/dL (ref 0.44–1.00)
GFR, Estimated: >60 mL/min (ref >60)
Glucose, Bld: 101 mg/dL — ABNORMAL HIGH (ref 70–99)
Potassium: 4.2 mmol/L (ref 3.5–5.1)
Sodium: 139 mmol/L (ref 135–145)
Total Bilirubin: 0.8 mg/dL (ref 0.0–1.2)
Total Protein: 6.8 g/dL (ref 6.5–8.1)

## 2023-12-01 LAB — TROPONIN I (HIGH SENSITIVITY): Troponin I (High Sensitivity): 13 ng/L (ref <18)

## 2023-12-01 LAB — PROTIME-INR
INR: 1 (ref 0.8–1.2)
Prothrombin Time: 14.3 s (ref 11.4–15.2)

## 2023-12-01 LAB — LACTIC ACID, PLASMA: Lactic Acid, Venous: 1 mmol/L (ref 0.5–1.9)

## 2023-12-01 MED ORDER — SODIUM CHLORIDE 0.9 % IV SOLN
1.0000 g | Freq: Once | INTRAVENOUS | Status: AC
  Administered 2023-12-01: 1 g via INTRAVENOUS
  Filled 2023-12-01: qty 10

## 2023-12-01 NOTE — ED Provider Notes (Signed)
 [place] EMERGENCY DEPARTMENT AT [facility] Provider Note   CSN: [id number] Arrival date & time: 12/01/23  2228     Patient presents with: Fall and Back Pain   [name] is a 89 y.o. female.  {Add pertinent medical, surgical, social history, OB history to HPI:7017} 89 year old female brought in by EMS from facility after fall on Plavix  which occurred yesterday when patient fell out of her wheelchair.  She reports pain in her left side back.  Found to have O2 sat of 91% on room air with EMS, placed on a simple mask.  She is hot to the touch, pending rectal temp, oral temp is normal however patient is mouth breathing.  Recently started on Lasix .  Also concern for inguinal hernia, reports history of inguinal hernia repair.       Prior to Admission medications   Medication Sig Start Date End Date Taking? Authorizing Provider  acetaminophen  (TYLENOL ) 500 MG tablet Take 500 mg by mouth 2 (two) times daily as needed for mild pain or headache.     [provider]  acetaminophen  (TYLENOL ) 500 MG tablet Take 1,000 mg by mouth. Give 1000 mg by mouth every morning and at bedtime for pain.    [provider]  albuterol  (VENTOLIN  HFA) 108 (90 Base) MCG/ACT inhaler Inhale 2 puffs into the lungs every 6 (six) hours as needed for wheezing or shortness of breath. 06/21/23   Medina-Vargas, Monina C, NP  budesonide (PULMICORT) 0.5 MG/2ML nebulizer solution Inhale 0.5 mg into the lungs 2 (two) times daily.    [provider]  cephALEXin  (KEFLEX ) 250 MG capsule Take 250 mg by mouth daily. 08/07/22   [provider]  Cholecalciferol  (VITAMIN D3) 50 MCG (2000 UT) TABS Take 50 mcg by mouth daily.    [provider]  clopidogrel  (PLAVIX ) 75 MG tablet TAKE ONE TABLET BY MOUTH DAILY 07/03/22   Cleotilde Garnette HERO, MD  Cranberry 500 MG TABS Take 1 tablet by mouth daily.    [provider]  GUAIFENESIN  PO Take by mouth. Give 10 ml  by mouth every 6 hours as needed for Cough for 2 Days Notify MD is continued cough    [provider]  ipratropium-albuterol  (DUONEB) 0.5-2.5 (3) MG/3ML SOLN Inhale 3 mLs into the lungs every 12 (twelve) hours as needed.    [provider]  ipratropium-albuterol  (DUONEB) 0.5-2.5 (3) MG/3ML SOLN Take 3 mLs by nebulization 2 (two) times daily. Patient not taking: Reported on 11/30/2023    [provider]  LORazepam (ATIVAN) 0.5 MG tablet Take 0.5 mg by mouth every 12 (twelve) hours.    [provider]  LORazepam (ATIVAN) 0.5 MG tablet Take 0.5 mg by mouth every 12 (twelve) hours.    [provider]  Menthol, Topical Analgesic, (BIOFREEZE ROLL-ON) 4 % GEL Apply 1 Application topically in the morning and at bedtime. Apply to right upper arm    [provider]  Menthol, Topical Analgesic, (BIOFREEZE ROLL-ON) 4 % GEL Apply 1 application  topically 2 (two) times daily. Apply to right knee    [provider]  metoprolol  succinate (TOPROL -XL) 25 MG 24 hr tablet Take one tablet by mouth once daily to regulate heart and control blood pressure. 04/24/22   Caro Harlene POUR, NP  Multiple Vitamins-Minerals (PRESERVISION AREDS 2 PO) Take 1 capsule by mouth 2 (two) times daily.     [provider]  omeprazole  (PRILOSEC) 20 MG capsule Take 1 capsule (20  mg total) by mouth daily. 11/29/23   Charlanne Fredia CROME, MD  potassium chloride  (KLOR-CON ) 10 MEQ tablet Take 20 mEq by mouth every morning.    [provider]  torsemide  (DEMADEX ) 20 MG tablet Take 1 tablet (20 mg total) by mouth daily. 11/29/23   Charlanne Fredia CROME, MD  vitamin C (ASCORBIC ACID) 500 MG tablet Take 500 mg by mouth daily with supper.     [provider]  Zinc Oxide 10 % OINT Apply 1 Application topically as needed.    [provider]    Allergies: Trimethoprim, Erythromycin, Macrodantin [nitrofurantoin macrocrystal], Other, Penicillins, Sulfa antibiotics, and  Latex    Review of Systems Negative except as per HPI Updated Vital Signs BP (!) 169/85 (BP Location: Left Arm)   Pulse 86   Temp 98.4 F (36.9 C)   Resp (!) 22   Ht 5' 2 (1.575 m)   Wt 86.6 kg   SpO2 98%   BMI 34.92 kg/m   Physical Exam Vitals and nursing note reviewed.  Constitutional:      General: She is not in acute distress.    Appearance: She is well-developed. She is obese. She is not diaphoretic.  HENT:     Head: Normocephalic and atraumatic.     Mouth/Throat:     Mouth: Mucous membranes are dry.  Eyes:     Conjunctiva/sclera: Conjunctivae normal.  Cardiovascular:     Rate and Rhythm: Normal rate and regular rhythm.     Heart sounds: Normal heart sounds.  Pulmonary:     Effort: Tachypnea present.     Breath sounds: Decreased air movement present. Examination of the right-lower field reveals decreased breath sounds. Examination of the left-lower field reveals decreased breath sounds. Decreased breath sounds present.  Abdominal:     Palpations: Abdomen is soft.     Tenderness: There is no abdominal tenderness.     Hernia: A hernia is present. Hernia is present in the right inguinal area.     Comments: Right inguinal hernia easily reduces with gentle pressure.  Musculoskeletal:        General: Tenderness present. No swelling.     Cervical back: Normal range of motion and neck supple. No tenderness.     Right lower leg: Edema present.     Left lower leg: Edema present.  Skin:    General: Skin is warm and dry.  Neurological:     Mental Status: She is alert and oriented to person, place, and time.  Psychiatric:        Behavior: Behavior normal.     (all labs ordered are listed, but only abnormal results are displayed) Labs Reviewed  CULTURE, BLOOD (ROUTINE X 2)  CULTURE, BLOOD (ROUTINE X 2)  COMPREHENSIVE METABOLIC PANEL WITH GFR  CBC WITH DIFFERENTIAL/PLATELET  URINALYSIS, ROUTINE W REFLEX MICROSCOPIC  BRAIN NATRIURETIC PEPTIDE  LACTIC ACID, PLASMA   LACTIC ACID, PLASMA  I-STAT VENOUS BLOOD GAS, ED  TROPONIN I (HIGH SENSITIVITY)    EKG: None  Radiology: No results found.  {Document cardiac monitor, telemetry assessment procedure when appropriate:32947} Procedures   Medications Ordered in the ED - No data to display    {Click here for ABCD2, HEART and other calculators REFRESH Note before signing:1}                              Medical Decision Making Amount and/or Complexity of Data Reviewed Labs: ordered. Radiology: ordered.   ***  {  Document critical care time when appropriate  Document review of labs and clinical decision tools ie CHADS2VASC2, etc  Document your independent review of radiology images and any outside records  Document your discussion with family members, caretakers and with consultants  Document social determinants of health affecting pt's care  Document your decision making why or why not admission, treatments were needed:32947:::1}   Final diagnoses:  None    ED Discharge Orders     None

## 2023-12-01 NOTE — ED Notes (Signed)
 Patient transported to CT

## 2023-12-01 NOTE — ED Triage Notes (Signed)
 Pt presents to the ED via POV with complaints of neck, back pain following a fall yesterday, and pelvic swelling from a hernia. Pt endorses some soreness in her back - however was not seen at the ED after the fall. Pt had a portable XR at her facility which was unremarkable. A&Ox4 at this time. Denies CP or SOB.

## 2023-12-02 DIAGNOSIS — Z7401 Bed confinement status: Secondary | ICD-10-CM | POA: Diagnosis not present

## 2023-12-02 DIAGNOSIS — R410 Disorientation, unspecified: Secondary | ICD-10-CM | POA: Diagnosis not present

## 2023-12-02 MED ORDER — CEPHALEXIN 500 MG PO CAPS: 500.0000 mg | ORAL_CAPSULE | Freq: Two times a day (BID) | ORAL | 0 refills | Status: AC

## 2023-12-02 NOTE — Discharge Instructions (Signed)
 Keflex  as prescribed. Repeat urine after antibiotics complete. Culture sent today. Recheck with PCP, return to ER for worsening or concerning symptoms.

## 2023-12-02 NOTE — ED Notes (Signed)
 [facility] contacted to provide the patient transport back to her nursing facility.

## 2023-12-03 ENCOUNTER — Encounter: Payer: Self-pay | Admitting: Orthopedic Surgery

## 2023-12-03 ENCOUNTER — Non-Acute Institutional Stay (SKILLED_NURSING_FACILITY): Payer: Self-pay | Admitting: Orthopedic Surgery

## 2023-12-03 DIAGNOSIS — R600 Localized edema: Secondary | ICD-10-CM | POA: Diagnosis not present

## 2023-12-03 DIAGNOSIS — R635 Abnormal weight gain: Secondary | ICD-10-CM | POA: Diagnosis not present

## 2023-12-03 DIAGNOSIS — R052 Subacute cough: Secondary | ICD-10-CM

## 2023-12-03 DIAGNOSIS — W19XXXD Unspecified fall, subsequent encounter: Secondary | ICD-10-CM

## 2023-12-03 DIAGNOSIS — M545 Low back pain, unspecified: Secondary | ICD-10-CM

## 2023-12-03 DIAGNOSIS — R278 Other lack of coordination: Secondary | ICD-10-CM | POA: Diagnosis not present

## 2023-12-03 DIAGNOSIS — R262 Difficulty in walking, not elsewhere classified: Secondary | ICD-10-CM | POA: Diagnosis not present

## 2023-12-03 DIAGNOSIS — M6281 Muscle weakness (generalized): Secondary | ICD-10-CM | POA: Diagnosis not present

## 2023-12-03 DIAGNOSIS — S81812D Laceration without foreign body, left lower leg, subsequent encounter: Secondary | ICD-10-CM

## 2023-12-03 MED ORDER — LIDOCAINE 4 % EX PTCH: 1.0000 | MEDICATED_PATCH | CUTANEOUS | Status: AC

## 2023-12-03 NOTE — Progress Notes (Signed)
 Location:  [facility] Room Number: 14/A [name]:  SNF [id number]) Provider:  Greig FORBES Cluster, NP   Charlanne Fredia CROME, MD  Patient Care Team: Charlanne Fredia CROME, MD as PCP - General (Internal Medicine) Court Dorn PARAS, MD as PCP - Cardiology (Cardiology) Celestia Agent, MD (Inactive) as Consulting Physician (Gastroenterology) Nicholaus Tanda CROME DOUGLAS, MD as Attending Physician (Urology) Court Dorn PARAS, MD as Consulting Physician (Cardiology) Ethyl Lonni FORBES, MD (Inactive) as Consulting Physician (Otolaryngology) Clare Senior, DDS (Dentistry) Rosan Credit, MD as Consulting Physician (Ophthalmology) Shari Easter, MD as Consulting Physician (Orthopedic Surgery) Tanda Locus, MD as Consulting Physician (General Surgery)  Extended Emergency Contact Information Primary Emergency Contact: Evetts,Don Address: [address] [place] [address] [place] Home Phone: [phone] Mobile Phone: [phone] Relation: Son Secondary Emergency Contact: Bertrum CHRISTELLA Gibson Address: [address]          [place], [place] [address] [place] Home Phone: [phone] Work Phone: [phone] Mobile Phone: [phone] Relation: Daughter  Code Status:  DNR Goals of care: Advanced Directive information    12/01/2023   10:32 PM  Advanced Directives  Does Patient Have a Medical Advance Directive? Yes     Chief Complaint  Patient presents with   Acute Visit    ED follow up    HPI:  Pt is a 89 y.o. female seen today for medical management of chronic diseases.    She currently resides on the skilled nursing unit at [facility]. PMH: hypertension, hyperlipidemia, macular degeneration, mild cognitive impairment, recurrent UTI, GERD, H/o TIA.   07/18 unwitnessed fall. No head injury. She c/o left lower back and hip pain after event. Xray left hip/pelvis negative for fracture or dislocation. Xray thoracic and lumbar  spine negative for fracture, L2-S1 levoscoliosis and degenerative changes noted, follow up CT/MRI recommended. 07/19 she continued to have left back pain. She presented to the ED for evaluation. CT cervical spine negative for fracture or malalignment, mild degenerative changes present. CT lumbar spine noted mild levoscoliosis and degenerative changes, no fracture. She also c/o abdominal pain. H/o right inguinal hernia repair. Hernia was reduced in ED without difficulty. Urinalysis was positive for bacteria and leukocytes, culture pending. Other labs unremarkable. She was given one dose of Rocephin  and discharged with Keflex . She was discharged back to [facility].   Today, she reports ongoing left lower back pain. No radiation of pain down legs. Pain increased with movement. She is receiving scheduled tylenol  with some relief.   Continues to have periods of coughing and wheezing. 07/17 budesonide nebs started. Also on Duonebs prn. ED CXR negative for fluid overload. Repeat BNP was 181 in ED.   LLE skin tear improving with hydrafera blue dressing changes.       Past Medical History:  Diagnosis Date   Cervicalgia    Chest pain at rest 01/07/2012   Displacement of cervical intervertebral disc without myelopathy    Diverticulosis of colon (without mention of hemorrhage)    Female stress incontinence    GERD (gastroesophageal reflux disease)    no peds occasional pepcid   Inguinal hernia without mention of obstruction or gangrene, unilateral or unspecified, (not specified as recurrent)    Internal hemorrhoids without mention of complication    Lumbago    Macular degeneration (senile) of retina, unspecified    Osteoarthrosis, unspecified whether generalized or localized, unspecified site    Other and  unspecified hyperlipidemia    Pain in joint, hand    Palpitations    Reflux esophagitis    Scoliosis (and kyphoscoliosis), idiopathic    Senile osteoporosis    Skin disorder    Thyroid   disease    TIA (transient ischemic attack)    Unruptured popliteal cyst 06/24/2014   Right knee    Unspecified essential hypertension    Unspecified glaucoma(365.9)    Unspecified hypothyroidism    Unspecified vitamin D  deficiency    Past Surgical History:  Procedure Laterality Date   ABDOMINAL HYSTERECTOMY  1975   Dr Sharron   APPENDECTOMY  1988   cardiolite myocardial perfusion study     DOPPLER ECHOCARDIOGRAPHY     EYE SURGERY Bilateral 2009   cataract removed right eye, Dr Carrie   FOOT SURGERY  august 2013   Hewitt, MD   INGUINAL HERNIA REPAIR Bilateral    w/mesh   INGUINAL HERNIA REPAIR Left 08/03/2017   Procedure: LAPAROSCOPIC LEFT INGUINAL HERNIA REPAIR WITH MESH;  Surgeon: Tanda Locus, MD;  Location: [facility] OR;  Service: General;  Laterality: Left;   INGUINAL HERNIA REPAIR Right 08/03/2017   Procedure: HERNIA REPAIR RIGHT INGUINAL ADULT WITH MESH;  Surgeon: Tanda Locus, MD;  Location: [facility] OR;  Service: General;  Laterality: Right;   INGUINAL HERNIA REPAIR     Dr. Tanda 04-24-18   INGUINAL HERNIA REPAIR Right 04/24/2018   Procedure: DIAGNOSTIC LAPAROSCOPIC, OPENREPAIR OF RECURRENT RIGHT INGUINAL HERNIA WITH MESH ERAS PATHWAY;  Surgeon: Tanda Locus, MD;  Location: [facility] ORS;  Service: General;  Laterality: Right;   INSERTION OF MESH Bilateral 08/03/2017   Procedure: INSERTION OF MESH;  Surgeon: Tanda Locus, MD;  Location: [facility] OR;  Service: General;  Laterality: Bilateral;   NM MYOVIEW  LTD     negative   TONSILLECTOMY  1937   TOOTH EXTRACTION  09/16/13   Dr Maryjean    Allergies  Allergen Reactions   Trimethoprim Other (See Comments)    Headaches/ ear pressure    Erythromycin Other (See Comments)    UNSPECIFIED REACTION    Macrodantin [Nitrofurantoin Macrocrystal] Other (See Comments)    UNSPECIFIED REACTION    Other     Honeydew Melon   Penicillins Other (See Comments)    UNSPECIFIED REACTION  Has patient had a PCN reaction causing immediate rash, facial/tongue/throat  swelling, SOB or lightheadedness with hypotension: Unknown Has patient had a PCN reaction causing severe rash involving mucus membranes or skin necrosis: Unknown Has patient had a PCN reaction that required hospitalization: No Has patient had a PCN reaction occurring within the last 10 years: No If all of the above answers are NO, then may proceed with Cephalosporin use.   Sulfa Antibiotics Other (See Comments)    UNSPECIFIED REACTION    Latex Itching    Outpatient Encounter Medications as of 12/03/2023  Medication Sig   acetaminophen  (TYLENOL ) 500 MG tablet Take 500 mg by mouth 2 (two) times daily as needed for mild pain or headache.    acetaminophen  (TYLENOL ) 500 MG tablet Take 1,000 mg by mouth. Give 1000 mg by mouth every morning and at bedtime for pain.   albuterol  (VENTOLIN  HFA) 108 (90 Base) MCG/ACT inhaler Inhale 2 puffs into the lungs every 6 (six) hours as needed for wheezing or shortness of breath.   budesonide (PULMICORT) 0.5 MG/2ML nebulizer solution Inhale 0.5 mg into the lungs 2 (two) times daily.   cephALEXin  (KEFLEX ) 250 MG capsule Take 250 mg by mouth daily.   cephALEXin  (KEFLEX )  500 MG capsule Take 1 capsule (500 mg total) by mouth 2 (two) times daily for 5 days.   Cholecalciferol  (VITAMIN D3) 50 MCG (2000 UT) TABS Take 50 mcg by mouth daily.   clopidogrel  (PLAVIX ) 75 MG tablet TAKE ONE TABLET BY MOUTH DAILY   Cranberry 500 MG TABS Take 1 tablet by mouth daily.   GUAIFENESIN  PO Take by mouth. Give 10 ml by mouth every 6 hours as needed for Cough for 2 Days Notify MD is continued cough   ipratropium-albuterol  (DUONEB) 0.5-2.5 (3) MG/3ML SOLN Inhale 3 mLs into the lungs every 12 (twelve) hours as needed.   ipratropium-albuterol  (DUONEB) 0.5-2.5 (3) MG/3ML SOLN Take 3 mLs by nebulization 2 (two) times daily. (Patient not taking: Reported on 11/30/2023)   LORazepam (ATIVAN) 0.5 MG tablet Take 0.5 mg by mouth every 12 (twelve) hours.   LORazepam (ATIVAN) 0.5 MG tablet Take  0.5 mg by mouth every 12 (twelve) hours.   Menthol, Topical Analgesic, (BIOFREEZE ROLL-ON) 4 % GEL Apply 1 Application topically in the morning and at bedtime. Apply to right upper arm   Menthol, Topical Analgesic, (BIOFREEZE ROLL-ON) 4 % GEL Apply 1 application  topically 2 (two) times daily. Apply to right knee   metoprolol  succinate (TOPROL -XL) 25 MG 24 hr tablet Take one tablet by mouth once daily to regulate heart and control blood pressure.   Multiple Vitamins-Minerals (PRESERVISION AREDS 2 PO) Take 1 capsule by mouth 2 (two) times daily.    omeprazole  (PRILOSEC) 20 MG capsule Take 1 capsule (20 mg total) by mouth daily.   potassium chloride  (KLOR-CON ) 10 MEQ tablet Take 20 mEq by mouth every morning.   torsemide  (DEMADEX ) 20 MG tablet Take 1 tablet (20 mg total) by mouth daily.   vitamin C (ASCORBIC ACID) 500 MG tablet Take 500 mg by mouth daily with supper.    Zinc Oxide 10 % OINT Apply 1 Application topically as needed.   No facility-administered encounter medications on file as of 12/03/2023.    Review of Systems  Constitutional:  Positive for activity change. Negative for fatigue and fever.  HENT:  Negative for sore throat and trouble swallowing.   Respiratory:  Positive for cough and wheezing. Negative for chest tightness and shortness of breath.   Cardiovascular:  Positive for leg swelling. Negative for chest pain.  Gastrointestinal:  Positive for abdominal pain. Negative for abdominal distention, constipation, diarrhea, nausea and vomiting.  Genitourinary:  Negative for dysuria and hematuria.  Musculoskeletal:  Positive for arthralgias, back pain and gait problem.  Skin:  Positive for wound.  Neurological:  Positive for weakness. Negative for dizziness and headaches.  Psychiatric/Behavioral:  Negative for confusion and dysphoric mood. The patient is not nervous/anxious.     Immunization History  Administered Date(s) Administered   Fluad Quad(high Dose 65+) 02/04/2019,  03/01/2022   Influenza Whole 02/24/2011, 02/12/2012   Influenza, High Dose Seasonal PF 02/01/2017, 02/25/2018, 02/11/2020, 03/13/2023   Influenza,inj,Quad PF,6+ Mos 02/20/2013, 02/10/2014, 02/19/2015, 02/18/2016   Influenza-Unspecified 02/16/2021   Moderna Covid-19 Fall Seasonal Vaccine 75yrs & older 02/09/2022, 03/21/2023   Moderna Sars-Covid-2 Vaccination 05/19/2019, 06/16/2019, 03/29/2020, 09/27/2020   PFIZER(Purple Top)SARS-COV-2 Vaccination 02/02/2021   Pneumococcal Conjugate-13 06/24/2014   Pneumococcal Polysaccharide-23 04/12/1998   Td 02/21/1996, 07/23/2003   Tdap 03/20/2010   Zoster Recombinant(Shingrix) 09/19/2016, 12/02/2016   Zoster, Live 09/15/2005   Pertinent  Health Maintenance Due  Topic Date Due   INFLUENZA VACCINE  12/14/2023   DEXA SCAN  Completed      09/06/2022  2:27 PM 12/27/2022    2:15 PM 04/18/2023    4:49 PM 08/27/2023    2:51 PM 09/14/2023   12:18 PM  Fall Risk  Falls in the past year? 1 0 0 1 1  Was there an injury with Fall? 1 0 0 0 0  Fall Risk Category Calculator 2 0 0 1 1  Patient at Risk for Falls Due to History of fall(s);Impaired balance/gait;Impaired mobility No Fall Risks History of fall(s);Impaired balance/gait;Impaired mobility History of fall(s);Impaired mobility History of fall(s);Impaired balance/gait  Fall risk Follow up Falls evaluation completed;Education provided;Falls prevention discussed Falls evaluation completed;Education provided;Falls prevention discussed Falls evaluation completed;Education provided;Falls prevention discussed Falls evaluation completed;Education provided Falls evaluation completed;Education provided   Functional Status Survey:    Vitals:   12/03/23 0950  BP: (!) 165/89  Pulse: 66  Resp: 18  Temp: (!) 97.3 F (36.3 C)  SpO2: 92%  Weight: 191 lb (86.6 kg)  Height: 5' 2 (1.575 m)   Body mass index is 34.93 kg/m. Physical Exam Vitals reviewed.  Constitutional:      General: She is not in acute  distress. HENT:     Head: Normocephalic.  Eyes:     General:        Right eye: No discharge.        Left eye: No discharge.  Cardiovascular:     Rate and Rhythm: Normal rate and regular rhythm.     Pulses: Normal pulses.     Heart sounds: Normal heart sounds.  Pulmonary:     Effort: Pulmonary effort is normal.     Breath sounds: Normal breath sounds.  Abdominal:     General: Bowel sounds are normal. There is no distension.     Palpations: Abdomen is soft.     Tenderness: There is no abdominal tenderness.  Musculoskeletal:     Cervical back: Neck supple.     Right lower leg: Edema present.     Left lower leg: Edema present.     Comments: Non pitting  Skin:    General: Skin is warm.     Capillary Refill: Capillary refill takes less than 2 seconds.     Comments: LLE skin tear with approx 50% granulation tissue to wound bed, surrounding skin with reduces erythema and swelling, no odor or drainage  Neurological:     General: No focal deficit present.     Mental Status: She is alert and oriented to person, place, and time.     Motor: Weakness present.     Gait: Gait abnormal.  Psychiatric:        Mood and Affect: Mood normal.     Labs reviewed: Recent Labs    10/15/23 0000 11/22/23 0000 12/01/23 2237 12/01/23 2246  NA 141 140 139 137  K 4.2 4.1 4.2 4.1  CL 106 105 99  --   CO2 27* 27* 26  --   GLUCOSE  --   --  101*  --   BUN 24* 19 17  --   CREATININE 0.6 0.6 0.80  --   CALCIUM  8.9 8.7 9.5  --    Recent Labs    03/05/23 0000 10/15/23 0000 12/01/23 2237  AST 14 13 22   ALT 16 13 17   ALKPHOS 71 54 53  BILITOT  --   --  0.8  PROT  --   --  6.8  ALBUMIN 3.4* 3.5 3.8   Recent Labs    03/05/23 0000 10/15/23 0000 12/01/23 2236 12/01/23 2246  WBC 7.4 6.9 10.0  --   NEUTROABS 4,151.00 3,436.00 6.7  --   HGB 12.2 11.2* 13.4 13.9  HCT 37 34* 41.7 41.0  MCV  --   --  95.6  --   PLT 219 221 218  --    Lab Results  Component Value Date   TSH 3.20 10/31/2022    Lab Results  Component Value Date   HGBA1C 5.3 05/02/2013   Lab Results  Component Value Date   CHOL 188 07/10/2019   HDL 75 07/10/2019   LDLCALC 94 07/10/2019   TRIG 96 07/10/2019   CHOLHDL 2.5 07/10/2019    Significant Diagnostic Results in last 30 days:  CT Lumbar Spine Wo Contrast Result Date: 12/01/2023 EXAM: CT OF THE LUMBAR SPINE WITHOUT CONTRAST 12/01/2023 11:17:01 PM TECHNIQUE: CT of the lumbar spine was performed without the administration of intravenous contrast. Multiplanar reformatted images are provided for review. Automated exposure control, iterative reconstruction, and/or weight based adjustment of the mA/kV was utilized to reduce the radiation dose to as low as reasonably achievable. COMPARISON: None available. CLINICAL HISTORY: Back trauma, no prior imaging (Age >= 16y). FINDINGS: BONES AND ALIGNMENT: Mild lumbar levoscoliosis. No acute fracture or suspicious bone lesion. DEGENERATIVE CHANGES: Mild multilevel degenerative changes. SOFT TISSUES: No acute abnormality. IMPRESSION: 1. No evidence of acute traumatic injury. Electronically signed by: Pinkie Pebbles MD 12/01/2023 11:26 PM EDT RP Workstation: [id number]   CT Cervical Spine Wo Contrast Result Date: 12/01/2023 EXAM: CT CERVICAL SPINE WITHOUT CONTRAST 12/01/2023 11:17:01 PM TECHNIQUE: CT of the cervical spine was performed without the administration of intravenous contrast. Multiplanar reformatted images are provided for review. Automated exposure control, iterative reconstruction, and/or weight based adjustment of the mA/kV was utilized to reduce the radiation dose to as low as reasonably achievable. COMPARISON: 07/26/2022 CLINICAL HISTORY: Neck trauma (Age >= 65y). FINDINGS: CERVICAL SPINE: BONES AND ALIGNMENT: No acute fracture or traumatic malalignment. DEGENERATIVE CHANGES: Mild degenerative changes of the mid cervical spine. SOFT TISSUES: No prevertebral soft tissue swelling. IMPRESSION: 1. No traumatic injury  to the cervical spine. Electronically signed by: Pinkie Pebbles MD 12/01/2023 11:25 PM EDT RP Workstation: [id number]   CT Head Wo Contrast Result Date: 12/01/2023 EXAM: CT HEAD WITHOUT CONTRAST 12/01/2023 11:17:01 PM TECHNIQUE: CT of the head was performed without the administration of intravenous contrast. Automated exposure control, iterative reconstruction, and/or weight based adjustment of the mA/kV was utilized to reduce the radiation dose to as low as reasonably achievable. COMPARISON: 07/26/2022 CLINICAL HISTORY: Head trauma, minor (Age >= 65y). FINDINGS: BRAIN AND VENTRICLES: No acute hemorrhage. Gray-white differentiation is preserved. No hydrocephalus. No extra-axial collection. No mass effect or midline shift. Global cortical atrophy. Subcortical and periventricular small vessel ischemic changes. Intracranial atherosclerosis. ORBITS: No acute abnormality. SINUSES: No acute abnormality. SOFT TISSUES AND SKULL: No acute soft tissue abnormality. No skull fracture. IMPRESSION: 1. No acute intracranial abnormality. 2. Atrophy with small vessel ischemic changes. Electronically signed by: Pinkie Pebbles MD 12/01/2023 11:24 PM EDT RP Workstation: [id number]   DG Chest Port 1 View Result Date: 12/01/2023 EXAM: 1 VIEW XRAY OF THE CHEST 12/01/2023 11:15:00 PM COMPARISON: 08/08/2020 CLINICAL HISTORY: SHOB. fall FINDINGS: LUNGS AND PLEURA: Mild patchy left basilar opacity likely atelectasis. No pulmonary edema. No pleural effusion. No pneumothorax. HEART AND MEDIASTINUM: No acute abnormality of the cardiac and mediastinal silhouettes. BONES AND SOFT TISSUES: No acute osseous abnormality. IMPRESSION: 1. Mild patchy left basilar opacity, likely atelectasis. Electronically signed by: Pinkie Pebbles MD 12/01/2023 11:20 PM EDT RP Workstation: [id number]  Assessment/Plan 1. Acute left-sided low back pain without sciatica (Primary) - 07/18 unwitnessed fall with increased left sided back pain after - xray  and CT lumbar spine revealed levoscoliosis and degenerative changes - pain with movement, no radiation - cont scheduled tylenol   - start lidocaine  4% patches x 14 days - if no improvement consider prednisone taper  2. Fall, subsequent encounter - see above  3. Subacute cough - ongoing - 07/19 CXR in ED negative for fluid overload - 07/17 budesonide nebs started - cont duonebs prn  4. Noninfected skin tear of left lower extremity, subsequent encounter - improved granulation tissue in wound bed - cont hydrafera blue dressing changes   5. Bilateral leg edema - BNP 181 12/01/2023 - non pitting edema - cont torsemide   6. Weight gain - 20 lbs weight gain in 3 months - see above  - TSH 2.20 08/2023 - repeat TSH  - cont weights 3x/week  Total time: 47 minutes.   Family/ staff Communication: plan of care discussed with patient, daughter and nurse  Labs/tests ordered:  repeat TSH 07/24

## 2023-12-06 DIAGNOSIS — M6281 Muscle weakness (generalized): Secondary | ICD-10-CM | POA: Diagnosis not present

## 2023-12-06 DIAGNOSIS — R262 Difficulty in walking, not elsewhere classified: Secondary | ICD-10-CM | POA: Diagnosis not present

## 2023-12-06 DIAGNOSIS — R278 Other lack of coordination: Secondary | ICD-10-CM | POA: Diagnosis not present

## 2023-12-06 DIAGNOSIS — E039 Hypothyroidism, unspecified: Secondary | ICD-10-CM | POA: Diagnosis not present

## 2023-12-06 LAB — BASIC METABOLIC PANEL WITH GFR
BUN: 26 — AB (ref 4–21)
CO2: 29 — AB (ref 13–22)
Chloride: 103 (ref 99–108)
Creatinine: 0.7 (ref 0.5–1.1)
Glucose: 92
Potassium: 3.8 meq/L (ref 3.5–5.1)
Sodium: 141 (ref 137–147)

## 2023-12-06 LAB — CULTURE, BLOOD (ROUTINE X 2)
Culture: NO GROWTH
Culture: NO GROWTH
Special Requests: ADEQUATE

## 2023-12-06 LAB — TSH: TSH: 1.8 (ref 0.41–5.90)

## 2023-12-06 LAB — COMPREHENSIVE METABOLIC PANEL WITH GFR: Calcium: 9.1 (ref 8.7–10.7)

## 2023-12-07 DIAGNOSIS — R262 Difficulty in walking, not elsewhere classified: Secondary | ICD-10-CM | POA: Diagnosis not present

## 2023-12-07 DIAGNOSIS — M6281 Muscle weakness (generalized): Secondary | ICD-10-CM | POA: Diagnosis not present

## 2023-12-07 DIAGNOSIS — R278 Other lack of coordination: Secondary | ICD-10-CM | POA: Diagnosis not present

## 2023-12-10 DIAGNOSIS — R278 Other lack of coordination: Secondary | ICD-10-CM | POA: Diagnosis not present

## 2023-12-10 DIAGNOSIS — R262 Difficulty in walking, not elsewhere classified: Secondary | ICD-10-CM | POA: Diagnosis not present

## 2023-12-10 DIAGNOSIS — M6281 Muscle weakness (generalized): Secondary | ICD-10-CM | POA: Diagnosis not present

## 2023-12-12 DIAGNOSIS — R278 Other lack of coordination: Secondary | ICD-10-CM | POA: Diagnosis not present

## 2023-12-12 DIAGNOSIS — M6281 Muscle weakness (generalized): Secondary | ICD-10-CM | POA: Diagnosis not present

## 2023-12-12 DIAGNOSIS — R262 Difficulty in walking, not elsewhere classified: Secondary | ICD-10-CM | POA: Diagnosis not present

## 2023-12-14 DIAGNOSIS — M6281 Muscle weakness (generalized): Secondary | ICD-10-CM | POA: Diagnosis not present

## 2023-12-14 DIAGNOSIS — R278 Other lack of coordination: Secondary | ICD-10-CM | POA: Diagnosis not present

## 2023-12-14 DIAGNOSIS — H35362 Drusen (degenerative) of macula, left eye: Secondary | ICD-10-CM | POA: Diagnosis not present

## 2023-12-14 DIAGNOSIS — H353221 Exudative age-related macular degeneration, left eye, with active choroidal neovascularization: Secondary | ICD-10-CM | POA: Diagnosis not present

## 2023-12-14 DIAGNOSIS — H35722 Serous detachment of retinal pigment epithelium, left eye: Secondary | ICD-10-CM | POA: Diagnosis not present

## 2023-12-14 DIAGNOSIS — H353211 Exudative age-related macular degeneration, right eye, with active choroidal neovascularization: Secondary | ICD-10-CM | POA: Diagnosis not present

## 2023-12-17 DIAGNOSIS — R278 Other lack of coordination: Secondary | ICD-10-CM | POA: Diagnosis not present

## 2023-12-17 DIAGNOSIS — M6281 Muscle weakness (generalized): Secondary | ICD-10-CM | POA: Diagnosis not present

## 2023-12-20 ENCOUNTER — Non-Acute Institutional Stay (SKILLED_NURSING_FACILITY): Payer: Self-pay | Admitting: Orthopedic Surgery

## 2023-12-20 ENCOUNTER — Encounter: Payer: Self-pay | Admitting: Orthopedic Surgery

## 2023-12-20 DIAGNOSIS — R600 Localized edema: Secondary | ICD-10-CM

## 2023-12-20 DIAGNOSIS — R052 Subacute cough: Secondary | ICD-10-CM | POA: Diagnosis not present

## 2023-12-20 DIAGNOSIS — R635 Abnormal weight gain: Secondary | ICD-10-CM

## 2023-12-20 DIAGNOSIS — M6281 Muscle weakness (generalized): Secondary | ICD-10-CM | POA: Diagnosis not present

## 2023-12-20 DIAGNOSIS — R062 Wheezing: Secondary | ICD-10-CM | POA: Diagnosis not present

## 2023-12-20 DIAGNOSIS — R4189 Other symptoms and signs involving cognitive functions and awareness: Secondary | ICD-10-CM | POA: Diagnosis not present

## 2023-12-20 DIAGNOSIS — R278 Other lack of coordination: Secondary | ICD-10-CM | POA: Diagnosis not present

## 2023-12-20 NOTE — Progress Notes (Unsigned)
 Location:  [facility] Room Number: 14/A [name]:  SNF [phone]) Provider:  Greig FORBES Cluster, NP   Charlanne Fredia CROME, MD  Patient Care Team: Charlanne Fredia CROME, MD as PCP - General (Internal Medicine) Court Dorn PARAS, MD as PCP - Cardiology (Cardiology) Celestia Agent, MD (Inactive) as Consulting Physician (Gastroenterology) Nicholaus Tanda CROME DOUGLAS, MD as Attending Physician (Urology) Court Dorn PARAS, MD as Consulting Physician (Cardiology) Ethyl Lonni FORBES, MD (Inactive) as Consulting Physician (Otolaryngology) Clare Senior, DDS (Dentistry) Rosan Credit, MD as Consulting Physician (Ophthalmology) Shari Easter, MD as Consulting Physician (Orthopedic Surgery) Tanda Locus, MD as Consulting Physician (General Surgery)  Extended Emergency Contact Information Primary Emergency Contact: Scobee,Don Address: [address] [place] [address] [place] Home Phone: [phone] Mobile Phone: [phone] Relation: Son Secondary Emergency Contact: Bertrum CHRISTELLA Gibson Address: [address]          [place], [place] [address] [place] Home Phone: [phone] Work Phone: [phone] Mobile Phone: [phone] Relation: Daughter  Code Status:  DNR Goals of care: Advanced Directive information    12/01/2023   10:32 PM  Advanced Directives  Does Patient Have a Medical Advance Directive? Yes     Chief Complaint  Patient presents with  . Acute Visit    Wheezing/cough     HPI:  Pt is a 89 y.o. female seen today for acute visit due to ongoing wheezing and cough.   She currently resides on the skilled nursing unit at [facility]. PMH: hypertension, hyperlipidemia, macular degeneration, mild cognitive impairment, recurrent UTI, GERD, H/o TIA.   Subacute cough and wheezing began 09/2023. CXR x 2 unremarkable. Unsuccessful trial prednisone and furosemide . She also has had substantial weight gain within past  year. BNP 96 (06/02)> 134 (07/10)> 181 (07/19). 07/17 she was started on budesonide neb trial x 2 weeks and torsemide . Patient reports some improvement with cough and wheezing but continues to have symptoms. Last night she had a coughing and wheezing episode. She was given duoneb and symptoms improved. Nursing reports she wheezing returned about 1 hour after neb treatment. Denies chest pain or shortness of breath. Described cough as sometimes productive. Afebrile. Vitals stable.   MMSE 28/30 08/2022, recent BIMS 8/15 (05/23). No behaviors. Needing more assistance with transfers. No behaviors. Not on medication.   Recent weights:  08/06- 185.2 lbs  08/01- 183.6 lbs  07/02- 175.1 lbs  06/01- 171.7 lbs  12/2022- 155.6 lbs    Past Medical History:  Diagnosis Date  . Cervicalgia   . Chest pain at rest 01/07/2012  . Displacement of cervical intervertebral disc without myelopathy   . Diverticulosis of colon (without mention of hemorrhage)   . Female stress incontinence   . GERD (gastroesophageal reflux disease)    no peds occasional pepcid  . Inguinal hernia without mention of obstruction or gangrene, unilateral or unspecified, (not specified as recurrent)   . Internal hemorrhoids without mention of complication   . Lumbago   . Macular degeneration (senile) of retina, unspecified   . Osteoarthrosis, unspecified whether generalized or localized, unspecified site   . Other and unspecified hyperlipidemia   . Pain in joint, hand   . Palpitations   . Reflux esophagitis   . Scoliosis (and kyphoscoliosis), idiopathic   . Senile osteoporosis   . Skin disorder   . Thyroid  disease   . TIA (transient ischemic attack)   . Unruptured popliteal cyst 06/24/2014  Right knee   . Unspecified essential hypertension   . Unspecified glaucoma(365.9)   . Unspecified hypothyroidism   . Unspecified vitamin D  deficiency    Past Surgical History:  Procedure Laterality Date  . ABDOMINAL HYSTERECTOMY  1975    Dr Sharron  . APPENDECTOMY  1988  . cardiolite myocardial perfusion study    . DOPPLER ECHOCARDIOGRAPHY    . EYE SURGERY Bilateral 2009   cataract removed right eye, Dr Carrie  . FOOT SURGERY  august 2013   Kit, MD  . INGUINAL HERNIA REPAIR Bilateral    w/mesh  . INGUINAL HERNIA REPAIR Left 08/03/2017   Procedure: LAPAROSCOPIC LEFT INGUINAL HERNIA REPAIR WITH MESH;  Surgeon: Tanda Locus, MD;  Location: [facility] OR;  Service: General;  Laterality: Left;  . INGUINAL HERNIA REPAIR Right 08/03/2017   Procedure: HERNIA REPAIR RIGHT INGUINAL ADULT WITH MESH;  Surgeon: Tanda Locus, MD;  Location: [facility] OR;  Service: General;  Laterality: Right;  . INGUINAL HERNIA REPAIR     Dr. Tanda 04-24-18  . INGUINAL HERNIA REPAIR Right 04/24/2018   Procedure: DIAGNOSTIC LAPAROSCOPIC, OPENREPAIR OF RECURRENT RIGHT INGUINAL HERNIA WITH MESH ERAS PATHWAY;  Surgeon: Tanda Locus, MD;  Location: [facility] ORS;  Service: General;  Laterality: Right;  . INSERTION OF MESH Bilateral 08/03/2017   Procedure: INSERTION OF MESH;  Surgeon: Tanda Locus, MD;  Location: [facility] OR;  Service: General;  Laterality: Bilateral;  . NM MYOVIEW  LTD     negative  . TONSILLECTOMY  1937  . TOOTH EXTRACTION  09/16/13   Dr Gallehon    Allergies  Allergen Reactions  . Trimethoprim Other (See Comments)    Headaches/ ear pressure   . Erythromycin Other (See Comments)    UNSPECIFIED REACTION   . Macrodantin [Nitrofurantoin Macrocrystal] Other (See Comments)    UNSPECIFIED REACTION   . Other     Honeydew Melon  . Penicillins Other (See Comments)    UNSPECIFIED REACTION  Has patient had a PCN reaction causing immediate rash, facial/tongue/throat swelling, SOB or lightheadedness with hypotension: Unknown Has patient had a PCN reaction causing severe rash involving mucus membranes or skin necrosis: Unknown Has patient had a PCN reaction that required hospitalization: No Has patient had a PCN reaction occurring within the last 10 years: No If all of  the above answers are NO, then may proceed with Cephalosporin use.  . Sulfa Antibiotics Other (See Comments)    UNSPECIFIED REACTION   . Latex Itching    Outpatient Encounter Medications as of 12/20/2023  Medication Sig  . acetaminophen  (TYLENOL ) 500 MG tablet Take 500 mg by mouth 2 (two) times daily as needed for mild pain or headache.   . acetaminophen  (TYLENOL ) 500 MG tablet Take 1,000 mg by mouth. Give 1000 mg by mouth every morning and at bedtime for pain.  . albuterol  (VENTOLIN  HFA) 108 (90 Base) MCG/ACT inhaler Inhale 2 puffs into the lungs every 6 (six) hours as needed for wheezing or shortness of breath.  . budesonide (PULMICORT) 0.5 MG/2ML nebulizer solution Inhale 0.5 mg into the lungs 2 (two) times daily.  . cephALEXin  (KEFLEX ) 250 MG capsule Take 250 mg by mouth daily.  . Cholecalciferol  (VITAMIN D3) 50 MCG (2000 UT) TABS Take 50 mcg by mouth daily.  . clopidogrel  (PLAVIX ) 75 MG tablet TAKE ONE TABLET BY MOUTH DAILY  . Cranberry 500 MG TABS Take 1 tablet by mouth daily.  . GUAIFENESIN  PO Take by mouth. Give 10 ml by mouth every 6 hours as needed for Cough  for 2 Days Notify MD is continued cough  . ipratropium-albuterol  (DUONEB) 0.5-2.5 (3) MG/3ML SOLN Inhale 3 mLs into the lungs every 12 (twelve) hours as needed.  SABRA LORazepam (ATIVAN) 0.5 MG tablet Take 0.5 mg by mouth every 12 (twelve) hours.  SABRA LORazepam (ATIVAN) 0.5 MG tablet Take 0.5 mg by mouth every 12 (twelve) hours.  . Menthol, Topical Analgesic, (BIOFREEZE ROLL-ON) 4 % GEL Apply 1 Application topically in the morning and at bedtime. Apply to right upper arm  . Menthol, Topical Analgesic, (BIOFREEZE ROLL-ON) 4 % GEL Apply 1 application  topically 2 (two) times daily. Apply to right knee  . metoprolol  succinate (TOPROL -XL) 25 MG 24 hr tablet Take one tablet by mouth once daily to regulate heart and control blood pressure.  . Multiple Vitamins-Minerals (PRESERVISION AREDS 2 PO) Take 1 capsule by mouth 2 (two) times daily.    . omeprazole  (PRILOSEC) 20 MG capsule Take 1 capsule (20 mg total) by mouth daily.  . potassium chloride  (KLOR-CON ) 10 MEQ tablet Take 20 mEq by mouth every morning.  . torsemide  (DEMADEX ) 20 MG tablet Take 1 tablet (20 mg total) by mouth daily.  . vitamin C (ASCORBIC ACID) 500 MG tablet Take 500 mg by mouth daily with supper.   . Zinc Oxide 10 % OINT Apply 1 Application topically as needed.   No facility-administered encounter medications on file as of 12/20/2023.    Review of Systems  Immunization History  Administered Date(s) Administered  . Fluad Quad(high Dose 65+) 02/04/2019, 03/01/2022  . Influenza Whole 02/24/2011, 02/12/2012  . Influenza, High Dose Seasonal PF 02/01/2017, 02/25/2018, 02/11/2020, 03/13/2023  . Influenza,inj,Quad PF,6+ Mos 02/20/2013, 02/10/2014, 02/19/2015, 02/18/2016  . Influenza-Unspecified 02/16/2021  . Moderna Covid-19 Fall Seasonal Vaccine 18yrs & older 02/09/2022, 03/21/2023  . Moderna Sars-Covid-2 Vaccination 05/19/2019, 06/16/2019, 03/29/2020, 09/27/2020  . PFIZER(Purple Top)SARS-COV-2 Vaccination 02/02/2021  . Pneumococcal Conjugate-13 06/24/2014  . Pneumococcal Polysaccharide-23 04/12/1998  . Td 02/21/1996, 07/23/2003  . Tdap 03/20/2010  . Zoster Recombinant(Shingrix) 09/19/2016, 12/02/2016  . Zoster, Live 09/15/2005   Pertinent  Health Maintenance Due  Topic Date Due  . INFLUENZA VACCINE  12/14/2023  . DEXA SCAN  Completed      09/06/2022    2:27 PM 12/27/2022    2:15 PM 04/18/2023    4:49 PM 08/27/2023    2:51 PM 09/14/2023   12:18 PM  Fall Risk  Falls in the past year? 1 0 0 1 1  Was there an injury with Fall? 1 0 0 0 0  Fall Risk Category Calculator 2 0 0 1 1  Patient at Risk for Falls Due to History of fall(s);Impaired balance/gait;Impaired mobility No Fall Risks History of fall(s);Impaired balance/gait;Impaired mobility History of fall(s);Impaired mobility History of fall(s);Impaired balance/gait  Fall risk Follow up Falls evaluation  completed;Education provided;Falls prevention discussed Falls evaluation completed;Education provided;Falls prevention discussed Falls evaluation completed;Education provided;Falls prevention discussed Falls evaluation completed;Education provided Falls evaluation completed;Education provided   Functional Status Survey:    Vitals:   12/20/23 1038  BP: (!) 157/91  Pulse: 82  Resp: 19  Temp: (!) 97 F (36.1 C)  SpO2: 97%  Weight: 185 lb 3.2 oz (84 kg)  Height: 5' 2 (1.575 m)   Body mass index is 33.87 kg/m. Physical Exam Vitals reviewed.  Constitutional:      General: She is not in acute distress. HENT:     Head: Normocephalic.  Eyes:     General:        Right eye: No discharge.  Left eye: No discharge.  Cardiovascular:     Rate and Rhythm: Normal rate and regular rhythm.     Pulses: Normal pulses.     Heart sounds: Normal heart sounds.  Pulmonary:     Effort: Pulmonary effort is normal. No respiratory distress.     Breath sounds: Examination of the right-upper field reveals wheezing. Examination of the left-upper field reveals wheezing. Examination of the right-middle field reveals wheezing. Examination of the left-middle field reveals wheezing. Wheezing present. No rhonchi or rales.     Comments: expiratory Abdominal:     General: Bowel sounds are normal.     Palpations: Abdomen is soft.  Musculoskeletal:     Cervical back: Neck supple.     Right lower leg: Edema present.     Left lower leg: Edema present.     Comments: Non pitting, ted hose on  Skin:    General: Skin is warm.     Capillary Refill: Capillary refill takes less than 2 seconds.  Neurological:     General: No focal deficit present.     Mental Status: She is alert. Mental status is at baseline.     Gait: Gait abnormal.  Psychiatric:        Mood and Affect: Mood normal.     Labs reviewed: Recent Labs    10/15/23 0000 11/22/23 0000 12/01/23 2237 12/01/23 2246  NA 141 140 139 137  K 4.2  4.1 4.2 4.1  CL 106 105 99  --   CO2 27* 27* 26  --   GLUCOSE  --   --  101*  --   BUN 24* 19 17  --   CREATININE 0.6 0.6 0.80  --   CALCIUM  8.9 8.7 9.5  --    Recent Labs    03/05/23 0000 10/15/23 0000 12/01/23 2237  AST 14 13 22   ALT 16 13 17   ALKPHOS 71 54 53  BILITOT  --   --  0.8  PROT  --   --  6.8  ALBUMIN 3.4* 3.5 3.8   Recent Labs    03/05/23 0000 10/15/23 0000 12/01/23 2236 12/01/23 2246  WBC 7.4 6.9 10.0  --   NEUTROABS 4,151.00 3,436.00 6.7  --   HGB 12.2 11.2* 13.4 13.9  HCT 37 34* 41.7 41.0  MCV  --   --  95.6  --   PLT 219 221 218  --    Lab Results  Component Value Date   TSH 3.20 10/31/2022   Lab Results  Component Value Date   HGBA1C 5.3 05/02/2013   Lab Results  Component Value Date   CHOL 188 07/10/2019   HDL 75 07/10/2019   LDLCALC 94 07/10/2019   TRIG 96 07/10/2019   CHOLHDL 2.5 07/10/2019    Significant Diagnostic Results in last 30 days:  CT Lumbar Spine Wo Contrast Result Date: 12/01/2023 EXAM: CT OF THE LUMBAR SPINE WITHOUT CONTRAST 12/01/2023 11:17:01 PM TECHNIQUE: CT of the lumbar spine was performed without the administration of intravenous contrast. Multiplanar reformatted images are provided for review. Automated exposure control, iterative reconstruction, and/or weight based adjustment of the mA/kV was utilized to reduce the radiation dose to as low as reasonably achievable. COMPARISON: None available. CLINICAL HISTORY: Back trauma, no prior imaging (Age >= 16y). FINDINGS: BONES AND ALIGNMENT: Mild lumbar levoscoliosis. No acute fracture or suspicious bone lesion. DEGENERATIVE CHANGES: Mild multilevel degenerative changes. SOFT TISSUES: No acute abnormality. IMPRESSION: 1. No evidence of acute traumatic injury. Electronically signed by:  Pinkie Pebbles MD 12/01/2023 11:26 PM EDT RP Workstation: [id number]   CT Cervical Spine Wo Contrast Result Date: 12/01/2023 EXAM: CT CERVICAL SPINE WITHOUT CONTRAST 12/01/2023 11:17:01 PM  TECHNIQUE: CT of the cervical spine was performed without the administration of intravenous contrast. Multiplanar reformatted images are provided for review. Automated exposure control, iterative reconstruction, and/or weight based adjustment of the mA/kV was utilized to reduce the radiation dose to as low as reasonably achievable. COMPARISON: 07/26/2022 CLINICAL HISTORY: Neck trauma (Age >= 65y). FINDINGS: CERVICAL SPINE: BONES AND ALIGNMENT: No acute fracture or traumatic malalignment. DEGENERATIVE CHANGES: Mild degenerative changes of the mid cervical spine. SOFT TISSUES: No prevertebral soft tissue swelling. IMPRESSION: 1. No traumatic injury to the cervical spine. Electronically signed by: Pinkie Pebbles MD 12/01/2023 11:25 PM EDT RP Workstation: [id number]   CT Head Wo Contrast Result Date: 12/01/2023 EXAM: CT HEAD WITHOUT CONTRAST 12/01/2023 11:17:01 PM TECHNIQUE: CT of the head was performed without the administration of intravenous contrast. Automated exposure control, iterative reconstruction, and/or weight based adjustment of the mA/kV was utilized to reduce the radiation dose to as low as reasonably achievable. COMPARISON: 07/26/2022 CLINICAL HISTORY: Head trauma, minor (Age >= 65y). FINDINGS: BRAIN AND VENTRICLES: No acute hemorrhage. Gray-white differentiation is preserved. No hydrocephalus. No extra-axial collection. No mass effect or midline shift. Global cortical atrophy. Subcortical and periventricular small vessel ischemic changes. Intracranial atherosclerosis. ORBITS: No acute abnormality. SINUSES: No acute abnormality. SOFT TISSUES AND SKULL: No acute soft tissue abnormality. No skull fracture. IMPRESSION: 1. No acute intracranial abnormality. 2. Atrophy with small vessel ischemic changes. Electronically signed by: Pinkie Pebbles MD 12/01/2023 11:24 PM EDT RP Workstation: [id number]   DG Chest Port 1 View Result Date: 12/01/2023 EXAM: 1 VIEW XRAY OF THE CHEST 12/01/2023 11:15:00 PM  COMPARISON: 08/08/2020 CLINICAL HISTORY: SHOB. fall FINDINGS: LUNGS AND PLEURA: Mild patchy left basilar opacity likely atelectasis. No pulmonary edema. No pleural effusion. No pneumothorax. HEART AND MEDIASTINUM: No acute abnormality of the cardiac and mediastinal silhouettes. BONES AND SOFT TISSUES: No acute osseous abnormality. IMPRESSION: 1. Mild patchy left basilar opacity, likely atelectasis. Electronically signed by: Pinkie Pebbles MD 12/01/2023 11:20 PM EDT RP Workstation: [id number]    Assessment/Plan 1. Wheezing (Primary) - ongoing since 09/2023 - unsuccessful trial prednisone - exp wheezing to middle/upper lobes - 07/17 started on budesonide nebs x 2 weeks> mild improvement/ continues to have symptoms daily  - cont duonebs - consider referral to pulmonary or Trelegy, will obtain CT chest first - CT Chest Wo Contrast; Future  2. Subacute cough - see above - past CXR unremarkable  - CT Chest Wo Contrast; Future  3. Cognitive impairment - recent BIMS 08/15 (05/23) - no behaviors - dependent with ADLs, except feeding - needing more help with transfers - not on medication - cont skilled nursing  4. Weight gain - 185.2 lbs (08/06)> was 155.6 lbs (12/2022) - TSH 1.80 12/06/2023 - differentials: CHF, sedentary lifestyle, hypothyroidism - cont weights 3x/week  5. Lower leg edema - BNP 96 (06/02)> 134 (07/10)> 181 (07/19) - see above - non pitting edema - 07/17 switched to torsemide     Family/ staff Communication: plan discussed with patient, daughter and nurse  Labs/tests ordered:  CT chest w/o contrast, BNP/BMP 08/11

## 2023-12-21 DIAGNOSIS — M6281 Muscle weakness (generalized): Secondary | ICD-10-CM | POA: Diagnosis not present

## 2023-12-21 DIAGNOSIS — R278 Other lack of coordination: Secondary | ICD-10-CM | POA: Diagnosis not present

## 2023-12-24 DIAGNOSIS — R278 Other lack of coordination: Secondary | ICD-10-CM | POA: Diagnosis not present

## 2023-12-24 DIAGNOSIS — R051 Acute cough: Secondary | ICD-10-CM | POA: Diagnosis not present

## 2023-12-24 DIAGNOSIS — M6281 Muscle weakness (generalized): Secondary | ICD-10-CM | POA: Diagnosis not present

## 2023-12-24 DIAGNOSIS — I5089 Other heart failure: Secondary | ICD-10-CM | POA: Diagnosis not present

## 2023-12-24 LAB — COMPREHENSIVE METABOLIC PANEL WITH GFR
Calcium: 9.1 (ref 8.7–10.7)
eGFR: 81

## 2023-12-24 LAB — BASIC METABOLIC PANEL WITH GFR
BUN: 23 — AB (ref 4–21)
CO2: 29 — AB (ref 13–22)
Chloride: 106 (ref 99–108)
Creatinine: 0.7 (ref 0.5–1.1)
Glucose: 84
Potassium: 4.1 meq/L (ref 3.5–5.1)
Sodium: 142 (ref 137–147)

## 2023-12-26 DIAGNOSIS — R278 Other lack of coordination: Secondary | ICD-10-CM | POA: Diagnosis not present

## 2023-12-26 DIAGNOSIS — M6281 Muscle weakness (generalized): Secondary | ICD-10-CM | POA: Diagnosis not present

## 2023-12-28 DIAGNOSIS — R278 Other lack of coordination: Secondary | ICD-10-CM | POA: Diagnosis not present

## 2023-12-28 DIAGNOSIS — H353221 Exudative age-related macular degeneration, left eye, with active choroidal neovascularization: Secondary | ICD-10-CM | POA: Diagnosis not present

## 2023-12-28 DIAGNOSIS — M6281 Muscle weakness (generalized): Secondary | ICD-10-CM | POA: Diagnosis not present

## 2023-12-31 DIAGNOSIS — R278 Other lack of coordination: Secondary | ICD-10-CM | POA: Diagnosis not present

## 2023-12-31 DIAGNOSIS — M6281 Muscle weakness (generalized): Secondary | ICD-10-CM | POA: Diagnosis not present

## 2024-01-02 ENCOUNTER — Non-Acute Institutional Stay (SKILLED_NURSING_FACILITY): Payer: Self-pay | Admitting: Orthopedic Surgery

## 2024-01-02 ENCOUNTER — Encounter: Payer: Self-pay | Admitting: Orthopedic Surgery

## 2024-01-02 DIAGNOSIS — R635 Abnormal weight gain: Secondary | ICD-10-CM | POA: Diagnosis not present

## 2024-01-02 DIAGNOSIS — R4189 Other symptoms and signs involving cognitive functions and awareness: Secondary | ICD-10-CM | POA: Diagnosis not present

## 2024-01-02 DIAGNOSIS — N390 Urinary tract infection, site not specified: Secondary | ICD-10-CM | POA: Diagnosis not present

## 2024-01-02 DIAGNOSIS — R600 Localized edema: Secondary | ICD-10-CM | POA: Diagnosis not present

## 2024-01-02 DIAGNOSIS — S81812D Laceration without foreign body, left lower leg, subsequent encounter: Secondary | ICD-10-CM | POA: Diagnosis not present

## 2024-01-02 DIAGNOSIS — I10 Essential (primary) hypertension: Secondary | ICD-10-CM

## 2024-01-02 DIAGNOSIS — R2681 Unsteadiness on feet: Secondary | ICD-10-CM

## 2024-01-02 DIAGNOSIS — G459 Transient cerebral ischemic attack, unspecified: Secondary | ICD-10-CM | POA: Diagnosis not present

## 2024-01-02 DIAGNOSIS — R052 Subacute cough: Secondary | ICD-10-CM | POA: Diagnosis not present

## 2024-01-02 NOTE — Progress Notes (Signed)
 Location:  [facility] Room Number: 14 A [name]:  SNF [phone]) Provider:  Greig Cluster, NP    Patient Care Team: Charlanne Fredia CROME, MD as PCP - General (Internal Medicine) Court Dorn PARAS, MD as PCP - Cardiology (Cardiology) Celestia Agent, MD (Inactive) as Consulting Physician (Gastroenterology) Nicholaus Tanda CROME DOUGLAS, MD as Attending Physician (Urology) Court Dorn PARAS, MD as Consulting Physician (Cardiology) Ethyl Lonni BRAVO, MD (Inactive) as Consulting Physician (Otolaryngology) Clare Senior, DDS (Dentistry) Rosan Credit, MD as Consulting Physician (Ophthalmology) Shari Easter, MD as Consulting Physician (Orthopedic Surgery) Tanda Locus, MD as Consulting Physician (General Surgery)  Extended Emergency Contact Information Primary Emergency Contact: Hankin,Don Address: [address] [place] [address] [place] Home Phone: [phone] Mobile Phone: [phone] Relation: Son Secondary Emergency Contact: Bertrum CHRISTELLA Gibson Address: [address]          [place], [place] [address] [place] Home Phone: [phone] Work Phone: [phone] Mobile Phone: [phone] Relation: Daughter  Code Status:  DNR Goals of care: Advanced Directive information    12/20/2023   11:23 AM  Advanced Directives  Does Patient Have a Medical Advance Directive? Yes  Type of Estate agent of [place];Living will;Out of facility DNR (pink MOST or yellow form)  Does patient want to make changes to medical advance directive? No - Patient declined  Copy of Healthcare Power of Attorney in Chart? Yes - validated most recent copy scanned in chart (See row information)     Chief Complaint  Patient presents with   Routine Visit    HPI:  Pt is a 89 y.o. female seen today for medical management of chronic diseases.  She currently resides on the skilled nursing unit at [facility]. PMH:  hypertension, hyperlipidemia, macular degeneration, mild cognitive impairment, recurrent UTI, GERD, H/o TIA.    Noninfected skin tear LLE- DOI 05/13, 07/16 started on hydrafera blue dressing changes, wound bed appears dry, wound care recommends Xeroform dressing  Subacute cough- onset 10/12/2023, CXR was unremarkable, BNP was 96 12/24/2023, 07/03 started on furosemide /KCL due to weight gain and persistent cough, also started on duonebs QAM, unsuccessful trial budesonide, CT chest ordered> awaiting to schedule Lower leg edema- see above, compression stockings daily  Cognitive impairment- MMSE 28/30 08/2022, recent BIMS 8/15 (05/23)> was 8/15 (02/25), was 12/15 (11/14) HTN- BUN/creat 24/0.64 10/15/2023, remains on metoprolol  TIA- noted in 2014, not followed by neurology, remains on Plavix  Recurrent UTI- followed by urology, remains on Keflex  Unstable gait- ambulates with wheelchair  Recent blood pressures:  08/19- 144/82  08/12- 153/82  08/05- 154/78  Recent weights:  08/20- 187 lbs  08/01- 183.6 lbs  07/02- 175 lbs  06/01- 171.1 lbs   12/2022- 155.6 lbs    Past Medical History:  Diagnosis Date   Cervicalgia    Chest pain at rest 01/07/2012   Displacement of cervical intervertebral disc without myelopathy    Diverticulosis of colon (without mention of hemorrhage)    Female stress incontinence    GERD (gastroesophageal reflux disease)    no peds occasional pepcid   Inguinal hernia without mention of obstruction or gangrene, unilateral or unspecified, (not specified as recurrent)    Internal hemorrhoids without mention of complication    Lumbago    Macular degeneration (senile) of retina, unspecified    Osteoarthrosis, unspecified whether generalized or localized, unspecified site    Other and unspecified hyperlipidemia    Pain  in joint, hand    Palpitations    Reflux esophagitis    Scoliosis (and kyphoscoliosis), idiopathic    Senile osteoporosis    Skin disorder    Thyroid   disease    TIA (transient ischemic attack)    Unruptured popliteal cyst 06/24/2014   Right knee    Unspecified essential hypertension    Unspecified glaucoma(365.9)    Unspecified hypothyroidism    Unspecified vitamin D  deficiency    Past Surgical History:  Procedure Laterality Date   ABDOMINAL HYSTERECTOMY  1975   Dr Sharron   APPENDECTOMY  1988   cardiolite myocardial perfusion study     DOPPLER ECHOCARDIOGRAPHY     EYE SURGERY Bilateral 2009   cataract removed right eye, Dr Carrie   FOOT SURGERY  august 2013   Hewitt, MD   INGUINAL HERNIA REPAIR Bilateral    w/mesh   INGUINAL HERNIA REPAIR Left 08/03/2017   Procedure: LAPAROSCOPIC LEFT INGUINAL HERNIA REPAIR WITH MESH;  Surgeon: Tanda Locus, MD;  Location: [facility] OR;  Service: General;  Laterality: Left;   INGUINAL HERNIA REPAIR Right 08/03/2017   Procedure: HERNIA REPAIR RIGHT INGUINAL ADULT WITH MESH;  Surgeon: Tanda Locus, MD;  Location: [facility] OR;  Service: General;  Laterality: Right;   INGUINAL HERNIA REPAIR     Dr. Tanda 04-24-18   INGUINAL HERNIA REPAIR Right 04/24/2018   Procedure: DIAGNOSTIC LAPAROSCOPIC, OPENREPAIR OF RECURRENT RIGHT INGUINAL HERNIA WITH MESH ERAS PATHWAY;  Surgeon: Tanda Locus, MD;  Location: [facility] ORS;  Service: General;  Laterality: Right;   INSERTION OF MESH Bilateral 08/03/2017   Procedure: INSERTION OF MESH;  Surgeon: Tanda Locus, MD;  Location: [facility] OR;  Service: General;  Laterality: Bilateral;   NM MYOVIEW  LTD     negative   TONSILLECTOMY  1937   TOOTH EXTRACTION  09/16/13   Dr Maryjean    Allergies  Allergen Reactions   Trimethoprim Other (See Comments)    Headaches/ ear pressure    Erythromycin Other (See Comments)    UNSPECIFIED REACTION    Macrodantin [Nitrofurantoin Macrocrystal] Other (See Comments)    UNSPECIFIED REACTION    Other     Honeydew Melon   Penicillins Other (See Comments)    UNSPECIFIED REACTION  Has patient had a PCN reaction causing immediate rash, facial/tongue/throat  swelling, SOB or lightheadedness with hypotension: Unknown Has patient had a PCN reaction causing severe rash involving mucus membranes or skin necrosis: Unknown Has patient had a PCN reaction that required hospitalization: No Has patient had a PCN reaction occurring within the last 10 years: No If all of the above answers are NO, then may proceed with Cephalosporin use.   Sulfa Antibiotics Other (See Comments)    UNSPECIFIED REACTION    Latex Itching    Outpatient Encounter Medications as of 01/02/2024  Medication Sig   acetaminophen  (TYLENOL ) 500 MG tablet Take 1,000 mg by mouth 3 (three) times daily.   cephALEXin  (KEFLEX ) 250 MG capsule Take 250 mg by mouth daily.   Cholecalciferol  (VITAMIN D3) 50 MCG (2000 UT) TABS Take 50 mcg by mouth daily.   clopidogrel  (PLAVIX ) 75 MG tablet TAKE ONE TABLET BY MOUTH DAILY   Cranberry 500 MG TABS Take 1 tablet by mouth daily.   ipratropium-albuterol  (DUONEB) 0.5-2.5 (3) MG/3ML SOLN Take 3 mLs by nebulization 2 (two) times daily.   Menthol, Topical Analgesic, (BIOFREEZE ROLL-ON) 4 % GEL Apply 1 Application topically in the morning and at bedtime. Apply to right upper arm   metoprolol  succinate (TOPROL -XL)  25 MG 24 hr tablet Take one tablet by mouth once daily to regulate heart and control blood pressure.   Multiple Vitamins-Minerals (PRESERVISION AREDS 2 PO) Take 1 capsule by mouth 2 (two) times daily.    omeprazole  (PRILOSEC) 20 MG capsule Take 1 capsule (20 mg total) by mouth daily.   potassium chloride  (KLOR-CON ) 10 MEQ tablet Take 20 mEq by mouth every morning.   torsemide  (DEMADEX ) 20 MG tablet Take 1 tablet (20 mg total) by mouth daily.   vitamin C (ASCORBIC ACID) 500 MG tablet Take 500 mg by mouth daily with supper.    albuterol  (VENTOLIN  HFA) 108 (90 Base) MCG/ACT inhaler Inhale 2 puffs into the lungs every 6 (six) hours as needed for wheezing or shortness of breath.   budesonide (PULMICORT) 0.5 MG/2ML nebulizer solution Inhale 0.5 mg into the  lungs 2 (two) times daily. (Patient not taking: Reported on 01/02/2024)   GUAIFENESIN  PO Take by mouth. Give 10 ml by mouth every 6 hours as needed for Cough for 2 Days Notify MD is continued cough (Patient not taking: Reported on 01/02/2024)   ipratropium-albuterol  (DUONEB) 0.5-2.5 (3) MG/3ML SOLN Inhale 3 mLs into the lungs every 12 (twelve) hours as needed.   Menthol, Topical Analgesic, (BIOFREEZE ROLL-ON) 4 % GEL Apply 1 application  topically every 12 (twelve) hours as needed. Apply to right knee   Zinc Oxide 10 % OINT Apply 1 Application topically as needed.   No facility-administered encounter medications on file as of 01/02/2024.    Review of Systems  Constitutional:  Positive for unexpected weight change.  HENT:  Positive for hearing loss. Negative for trouble swallowing.   Eyes: Negative.   Respiratory:  Positive for cough and wheezing. Negative for shortness of breath.   Cardiovascular:  Positive for leg swelling. Negative for chest pain.  Gastrointestinal:  Negative for abdominal distention and abdominal pain.  Genitourinary: Negative.   Musculoskeletal:  Positive for arthralgias and gait problem.  Skin:  Positive for wound.  Neurological:  Positive for weakness. Negative for dizziness and light-headedness.  Psychiatric/Behavioral:  Positive for confusion. Negative for dysphoric mood and sleep disturbance. The patient is not nervous/anxious.     Immunization History  Administered Date(s) Administered   Fluad Quad(high Dose 65+) 02/04/2019, 03/01/2022   Influenza Whole 02/24/2011, 02/12/2012   Influenza, High Dose Seasonal PF 02/01/2017, 02/25/2018, 02/11/2020, 03/13/2023   Influenza,inj,Quad PF,6+ Mos 02/20/2013, 02/10/2014, 02/19/2015, 02/18/2016   Influenza-Unspecified 02/16/2021   Moderna Covid-19 Fall Seasonal Vaccine 26yrs & older 02/09/2022, 03/21/2023   Moderna Sars-Covid-2 Vaccination 05/19/2019, 06/16/2019, 03/29/2020, 09/27/2020   PFIZER(Purple Top)SARS-COV-2  Vaccination 02/02/2021   Pneumococcal Conjugate-13 06/24/2014   Pneumococcal Polysaccharide-23 04/12/1998   Td 02/21/1996, 07/23/2003   Tdap 03/20/2010   Zoster Recombinant(Shingrix) 09/19/2016, 12/02/2016   Zoster, Live 09/15/2005   Pertinent  Health Maintenance Due  Topic Date Due   INFLUENZA VACCINE  12/14/2023   DEXA SCAN  Completed      12/27/2022    2:15 PM 04/18/2023    4:49 PM 08/27/2023    2:51 PM 09/14/2023   12:18 PM 12/20/2023   11:31 AM  Fall Risk  Falls in the past year? 0 0 1 1 1   Was there an injury with Fall? 0 0 0 0 1  Fall Risk Category Calculator 0 0 1 1 3   Patient at Risk for Falls Due to No Fall Risks History of fall(s);Impaired balance/gait;Impaired mobility History of fall(s);Impaired mobility History of fall(s);Impaired balance/gait History of fall(s);Impaired balance/gait;Impaired mobility  Fall risk  Follow up Falls evaluation completed;Education provided;Falls prevention discussed Falls evaluation completed;Education provided;Falls prevention discussed Falls evaluation completed;Education provided Falls evaluation completed;Education provided Falls evaluation completed;Education provided   Functional Status Survey:    Vitals:   01/02/24 1031  BP: 134/86  Pulse: 77  Resp: 20  Temp: 97.7 F (36.5 C)  SpO2: 95%  Weight: 186 lb 4.8 oz (84.5 kg)  Height: 5' 2 (1.575 m)   Body mass index is 34.07 kg/m. Physical Exam Vitals reviewed.  Constitutional:      General: She is not in acute distress. HENT:     Head: Normocephalic.     Right Ear: There is no impacted cerumen.     Left Ear: There is no impacted cerumen.     Nose: Nose normal.     Mouth/Throat:     Mouth: Mucous membranes are moist.  Eyes:     General:        Right eye: No discharge.        Left eye: No discharge.  Cardiovascular:     Rate and Rhythm: Normal rate and regular rhythm.     Pulses: Normal pulses.     Heart sounds: Normal heart sounds.  Pulmonary:     Effort: Pulmonary  effort is normal. No respiratory distress.     Breath sounds: Wheezing present. No rhonchi or rales.     Comments: Exp wheezing to upper lobes Abdominal:     General: Bowel sounds are normal.     Palpations: Abdomen is soft.  Musculoskeletal:     Cervical back: Neck supple.     Right lower leg: Edema present.     Left lower leg: Edema present.     Comments: Non pitting, compression stockings on  Skin:    General: Skin is warm.     Capillary Refill: Capillary refill takes less than 2 seconds.     Comments: LLE wound to LLE CDI, wound bed appears dry with light pink granulation tissue, no purulent drainage/swelling/tenderness/erythema  Neurological:     General: No focal deficit present.     Mental Status: She is alert. Mental status is at baseline.     Gait: Gait abnormal.  Psychiatric:        Mood and Affect: Mood normal.     Labs reviewed: Recent Labs    12/01/23 2237 12/01/23 2246 12/06/23 0000 12/24/23 0000  NA 139 137 141 142  K 4.2 4.1 3.8 4.1  CL 99  --  103 106  CO2 26  --  29* 29*  GLUCOSE 101*  --   --   --   BUN 17  --  26* 23*  CREATININE 0.80  --  0.7 0.7  CALCIUM  9.5  --  9.1 9.1   Recent Labs    03/05/23 0000 10/15/23 0000 12/01/23 2237  AST 14 13 22   ALT 16 13 17   ALKPHOS 71 54 53  BILITOT  --   --  0.8  PROT  --   --  6.8  ALBUMIN 3.4* 3.5 3.8   Recent Labs    03/05/23 0000 10/15/23 0000 12/01/23 2236 12/01/23 2246  WBC 7.4 6.9 10.0  --   NEUTROABS 4,151.00 3,436.00 6.7  --   HGB 12.2 11.2* 13.4 13.9  HCT 37 34* 41.7 41.0  MCV  --   --  95.6  --   PLT 219 221 218  --    Lab Results  Component Value Date   TSH 1.80 12/06/2023  Lab Results  Component Value Date   HGBA1C 5.3 05/02/2013   Lab Results  Component Value Date   CHOL 188 07/10/2019   HDL 75 07/10/2019   LDLCALC 94 07/10/2019   TRIG 96 07/10/2019   CHOLHDL 2.5 07/10/2019    Significant Diagnostic Results in last 30 days:  No results  found.  Assessment/Plan 1. Noninfected skin tear of left lower extremity, subsequent encounter (Primary) - DOI 05/13 - completed doxy in past - 07/16 started on hydrafera blue dressing changes - wound bed appears dried out - start Xeroform dressing changes every other day - start Prostat x 14 days   2. Subacute cough - - ongoing since 09/2023 - unsuccessful trial prednisone - exp wheezing to middle/upper lobes - 07/17 started on budesonide nebs x 2 weeks> mild improvement/ continues to have symptoms daily  - cont duonebs - consider referral to pulmonary or Trelegy, will obtain CT chest first - CT Chest Wo Contrast; Future> has not been scheduled> referral coordinator left message with daughter  3. Lower leg edema - recent BNP 96 - cont torsemide   4. Cognitive impairment - no behaviors - dependent with ADLs except feeding - not on medication  5. Essential hypertension - controlled with metoprolol   6. TIA (transient ischemic attack) - cont plavix    7. Recurrent UTI - cont Keflex   8. Unstable gait - cont skilled nursing  9. Weight gain - 187 (08/20)> was 155.6 lbs (12/2022) - TSH 1.80 12/06/2023 - differentials: CHF, sedentary lifestyle, hypothyroidism - cont weights 3x/week    Family/ staff Communication: plan discussed with patient and nurse  Labs/tests ordered:  CT chest awaiting to be scheduled by family

## 2024-01-11 ENCOUNTER — Non-Acute Institutional Stay (SKILLED_NURSING_FACILITY): Payer: Self-pay | Admitting: Orthopedic Surgery

## 2024-01-11 DIAGNOSIS — R052 Subacute cough: Secondary | ICD-10-CM | POA: Diagnosis not present

## 2024-01-11 DIAGNOSIS — L089 Local infection of the skin and subcutaneous tissue, unspecified: Secondary | ICD-10-CM | POA: Diagnosis not present

## 2024-01-11 DIAGNOSIS — L03116 Cellulitis of left lower limb: Secondary | ICD-10-CM

## 2024-01-11 NOTE — Progress Notes (Signed)
 Location:  [facility] Room Number: 14A [name]:  SNF [phone]) Provider:  Gil No NP  Charlanne Fredia CROME, MD  Patient Care Team: Charlanne Fredia CROME, MD as PCP - General (Internal Medicine) Court Dorn PARAS, MD as PCP - Cardiology (Cardiology) Celestia Agent, MD (Inactive) as Consulting Physician (Gastroenterology) Nicholaus Tanda CROME DOUGLAS, MD as Attending Physician (Urology) Court Dorn PARAS, MD as Consulting Physician (Cardiology) Ethyl Lonni BRAVO, MD (Inactive) as Consulting Physician (Otolaryngology) Clare Senior, DDS (Dentistry) Rosan Credit, MD as Consulting Physician (Ophthalmology) Shari Easter, MD as Consulting Physician (Orthopedic Surgery) Tanda Locus, MD as Consulting Physician (General Surgery)  Extended Emergency Contact Information Primary Emergency Contact: Mcnall,Don Address: [address] [place] [address] [place] Home Phone: [phone] Mobile Phone: [phone] Relation: Son Secondary Emergency Contact: Bertrum CHRISTELLA Gibson Address: [address]          [place], [place] [address] [place] Home Phone: [phone] Work Phone: [phone] Mobile Phone: [phone] Relation: Daughter  Code Status:  DNR Goals of care: Advanced Directive information    01/11/2024   11:19 AM  Advanced Directives  Does Patient Have a Medical Advance Directive? Yes  Type of Advance Directive Out of facility DNR (pink MOST or yellow form)  Does patient want to make changes to medical advance directive? No - Patient declined  Pre-existing out of facility DNR order (yellow form or pink MOST form) Pink MOST form placed in chart (order not valid for inpatient use)     Chief Complaint  Patient presents with   Acute Visit    Wound Infection     HPI:  Pt is a 89 y.o. female seen today for acute visit due to non-healing wound.   She currently resides on the skilled nursing unit at [facility]. PMH: hypertension, hyperlipidemia, macular degeneration, mild cognitive impairment, recurrent UTI, GERD, H/o TIA.   Skin tear LLE- DOI 05/13, 07/16 started on hydrafera blue dressing changes, completed round of doxycycline  09/2023, 07/16 hydrafera dressing changes started due to non healing, Xeroform dressing changes started 10-14 days ago.   Subacute -  to have intermittent cough and wheezing. Scheduled for CT chest next week.   Past Medical History:  Diagnosis Date   Cervicalgia    Chest pain at rest 01/07/2012   Displacement of cervical intervertebral disc without myelopathy    Diverticulosis of colon (without mention of hemorrhage)    Female stress incontinence    GERD (gastroesophageal reflux disease)    no peds occasional pepcid   Inguinal hernia without mention of obstruction or gangrene, unilateral or unspecified, (not specified as recurrent)    Internal hemorrhoids without mention of complication    Lumbago    Macular degeneration (senile) of retina, unspecified    Osteoarthrosis, unspecified whether generalized or localized, unspecified site    Other and unspecified hyperlipidemia    Pain in joint, hand    Palpitations    Reflux esophagitis    Scoliosis (and kyphoscoliosis), idiopathic    Senile osteoporosis    Skin disorder    Thyroid  disease    TIA (transient ischemic attack)    Unruptured popliteal cyst 06/24/2014   Right knee    Unspecified essential hypertension    Unspecified glaucoma(365.9)    Unspecified hypothyroidism    Unspecified vitamin D  deficiency    Past Surgical History:  Procedure Laterality Date   ABDOMINAL HYSTERECTOMY  1975  Dr Sharron   APPENDECTOMY  1988   cardiolite myocardial perfusion study     DOPPLER ECHOCARDIOGRAPHY     EYE SURGERY Bilateral 2009   cataract removed right eye, Dr Carrie   FOOT SURGERY  august 2013   Hewitt, MD   INGUINAL HERNIA REPAIR Bilateral    w/mesh   INGUINAL HERNIA REPAIR Left 08/03/2017   Procedure:  LAPAROSCOPIC LEFT INGUINAL HERNIA REPAIR WITH MESH;  Surgeon: Tanda Locus, MD;  Location: [facility] OR;  Service: General;  Laterality: Left;   INGUINAL HERNIA REPAIR Right 08/03/2017   Procedure: HERNIA REPAIR RIGHT INGUINAL ADULT WITH MESH;  Surgeon: Tanda Locus, MD;  Location: [facility] OR;  Service: General;  Laterality: Right;   INGUINAL HERNIA REPAIR     Dr. Tanda 04-24-18   INGUINAL HERNIA REPAIR Right 04/24/2018   Procedure: DIAGNOSTIC LAPAROSCOPIC, OPENREPAIR OF RECURRENT RIGHT INGUINAL HERNIA WITH MESH ERAS PATHWAY;  Surgeon: Tanda Locus, MD;  Location: [facility] ORS;  Service: General;  Laterality: Right;   INSERTION OF MESH Bilateral 08/03/2017   Procedure: INSERTION OF MESH;  Surgeon: Tanda Locus, MD;  Location: [facility] OR;  Service: General;  Laterality: Bilateral;   NM MYOVIEW  LTD     negative   TONSILLECTOMY  1937   TOOTH EXTRACTION  09/16/13   Dr Maryjean    Allergies  Allergen Reactions   Trimethoprim Other (See Comments)    Headaches/ ear pressure    Erythromycin Other (See Comments)    UNSPECIFIED REACTION    Macrodantin [Nitrofurantoin Macrocrystal] Other (See Comments)    UNSPECIFIED REACTION    Other     Honeydew Melon   Penicillins Other (See Comments)    UNSPECIFIED REACTION  Has patient had a PCN reaction causing immediate rash, facial/tongue/throat swelling, SOB or lightheadedness with hypotension: Unknown Has patient had a PCN reaction causing severe rash involving mucus membranes or skin necrosis: Unknown Has patient had a PCN reaction that required hospitalization: No Has patient had a PCN reaction occurring within the last 10 years: No If all of the above answers are NO, then may proceed with Cephalosporin use.   Sulfa Antibiotics Other (See Comments)    UNSPECIFIED REACTION    Latex Itching    Allergies as of 01/11/2024       Reactions   Trimethoprim Other (See Comments)   Headaches/ ear pressure    Erythromycin Other (See Comments)   UNSPECIFIED REACTION     Macrodantin [nitrofurantoin Macrocrystal] Other (See Comments)   UNSPECIFIED REACTION    Other    Honeydew Melon   Penicillins Other (See Comments)   UNSPECIFIED REACTION  Has patient had a PCN reaction causing immediate rash, facial/tongue/throat swelling, SOB or lightheadedness with hypotension: Unknown Has patient had a PCN reaction causing severe rash involving mucus membranes or skin necrosis: Unknown Has patient had a PCN reaction that required hospitalization: No Has patient had a PCN reaction occurring within the last 10 years: No If all of the above answers are NO, then may proceed with Cephalosporin use.   Sulfa Antibiotics Other (See Comments)   UNSPECIFIED REACTION    Latex Itching        Medication List        Accurate as of January 11, 2024 11:24 AM. If you have any questions, ask your nurse or doctor.          acetaminophen  500 MG tablet Commonly known as: TYLENOL  Take 1,000 mg by mouth 3 (three) times daily.   albuterol  108 (90 Base) MCG/ACT  inhaler Commonly known as: VENTOLIN  HFA Inhale 2 puffs into the lungs every 6 (six) hours as needed for wheezing or shortness of breath.   ascorbic acid 500 MG tablet Commonly known as: VITAMIN C Take 500 mg by mouth daily with supper.   Biofreeze Roll-On 4 % Gel Generic drug: Menthol (Topical Analgesic) Apply 1 Application topically in the morning and at bedtime. Apply to right upper arm   Biofreeze Roll-On 4 % Gel Generic drug: Menthol (Topical Analgesic) Apply 1 application  topically every 12 (twelve) hours as needed. Apply to right knee   cephALEXin  250 MG capsule Commonly known as: KEFLEX  Take 250 mg by mouth daily.   clopidogrel  75 MG tablet Commonly known as: PLAVIX  TAKE ONE TABLET BY MOUTH DAILY   Cranberry 500 MG Tabs Take 1 tablet by mouth daily.   guaiFENesin -dextromethorphan 100-10 MG/5ML syrup Commonly known as: ROBITUSSIN DM Take 10 mLs by mouth every 6 (six) hours as needed for cough.    ipratropium-albuterol  0.5-2.5 (3) MG/3ML Soln Commonly known as: DUONEB Inhale 3 mLs into the lungs every 12 (twelve) hours as needed.   ipratropium-albuterol  0.5-2.5 (3) MG/3ML Soln Commonly known as: DUONEB Take 3 mLs by nebulization 2 (two) times daily.   metoprolol  succinate 25 MG 24 hr tablet Commonly known as: TOPROL -XL Take one tablet by mouth once daily to regulate heart and control blood pressure.   omeprazole  20 MG capsule Commonly known as: PRILOSEC Take 1 capsule (20 mg total) by mouth daily.   potassium chloride  10 MEQ tablet Commonly known as: KLOR-CON  Take 20 mEq by mouth every morning.   PRESERVISION AREDS 2 PO Take 1 capsule by mouth 2 (two) times daily.   Pro-Stat Liqd Take 30 mLs by mouth in the morning.  Give 30 ml by mouth in the morning for wound healing for 3 Weeks qam x 21 days   torsemide  20 MG tablet Commonly known as: DEMADEX  Take 1 tablet (20 mg total) by mouth daily.   Vitamin D3 50 MCG (2000 UT) Tabs Take 50 mcg by mouth daily.   Zinc Oxide 10 % Oint Apply 1 Application topically as needed.        Review of Systems  Constitutional: Negative.   Respiratory:  Positive for cough and wheezing. Negative for shortness of breath.   Cardiovascular: Negative.   Skin:  Positive for wound.  Psychiatric/Behavioral:  Negative for dysphoric mood. The patient is not nervous/anxious.     Immunization History  Administered Date(s) Administered   Fluad Quad(high Dose 65+) 02/04/2019, 03/01/2022   INFLUENZA, HIGH DOSE SEASONAL PF 02/01/2017, 02/25/2018, 02/11/2020, 03/13/2023   Influenza Whole 02/24/2011, 02/12/2012   Influenza,inj,Quad PF,6+ Mos 02/20/2013, 02/10/2014, 02/19/2015, 02/18/2016   Influenza-Unspecified 02/16/2021   Moderna Covid-19 Fall Seasonal Vaccine 62yrs & older 02/09/2022, 03/21/2023   Moderna Sars-Covid-2 Vaccination 05/19/2019, 06/16/2019, 03/29/2020, 09/27/2020   PFIZER(Purple Top)SARS-COV-2 Vaccination 02/02/2021    Pneumococcal Conjugate-13 06/24/2014   Pneumococcal Polysaccharide-23 04/12/1998   Td 02/21/1996, 07/23/2003   Tdap 03/20/2010   Zoster Recombinant(Shingrix) 09/19/2016, 12/02/2016   Zoster, Live 09/15/2005   Pertinent  Health Maintenance Due  Topic Date Due   INFLUENZA VACCINE  12/14/2023   DEXA SCAN  Completed      12/27/2022    2:15 PM 04/18/2023    4:49 PM 08/27/2023    2:51 PM 09/14/2023   12:18 PM 12/20/2023   11:31 AM  Fall Risk  Falls in the past year? 0 0 1 1 1   Was there an injury with Fall?  0 0 0 0 1  Fall Risk Category Calculator 0 0 1 1 3   Patient at Risk for Falls Due to No Fall Risks History of fall(s);Impaired balance/gait;Impaired mobility History of fall(s);Impaired mobility History of fall(s);Impaired balance/gait History of fall(s);Impaired balance/gait;Impaired mobility  Fall risk Follow up Falls evaluation completed;Education provided;Falls prevention discussed Falls evaluation completed;Education provided;Falls prevention discussed Falls evaluation completed;Education provided Falls evaluation completed;Education provided Falls evaluation completed;Education provided   Functional Status Survey:    Vitals:   01/11/24 1110  BP: (!) 164/81  Pulse: 79  Resp: 17  Temp: 97.8 F (36.6 C)  SpO2: 94%  Weight: 186 lb 3.2 oz (84.5 kg)  Height: 5' 2 (1.575 m)   Body mass index is 34.06 kg/m. Physical Exam Vitals reviewed.  Constitutional:      General: She is not in acute distress.    Appearance: She is obese.  HENT:     Head: Normocephalic.  Eyes:     General:        Right eye: No discharge.        Left eye: No discharge.  Cardiovascular:     Rate and Rhythm: Normal rate and regular rhythm.     Pulses: Normal pulses.     Heart sounds: Normal heart sounds.  Pulmonary:     Effort: Pulmonary effort is normal.     Breath sounds: Normal breath sounds. No wheezing or rales.  Musculoskeletal:     Cervical back: Neck supple.     Right lower leg: Edema  present.     Left lower leg: Edema present.     Comments: Non pitting  Skin:    General: Skin is warm.     Comments: Approx 1-2 cm wound to left calf/shin, wound bed with slough, surrounding skin with tenderness/swelling/erythema, no drainage.   Neurological:     General: No focal deficit present.     Mental Status: She is alert. Mental status is at baseline.     Gait: Gait abnormal.  Psychiatric:        Mood and Affect: Mood normal.     Labs reviewed: Recent Labs    12/01/23 2237 12/01/23 2246 12/06/23 0000 12/24/23 0000  NA 139 137 141 142  K 4.2 4.1 3.8 4.1  CL 99  --  103 106  CO2 26  --  29* 29*  GLUCOSE 101*  --   --   --   BUN 17  --  26* 23*  CREATININE 0.80  --  0.7 0.7  CALCIUM  9.5  --  9.1 9.1   Recent Labs    03/05/23 0000 10/15/23 0000 12/01/23 2237  AST 14 13 22   ALT 16 13 17   ALKPHOS 71 54 53  BILITOT  --   --  0.8  PROT  --   --  6.8  ALBUMIN 3.4* 3.5 3.8   Recent Labs    03/05/23 0000 10/15/23 0000 12/01/23 2236 12/01/23 2246  WBC 7.4 6.9 10.0  --   NEUTROABS 4,151.00 3,436.00 6.7  --   HGB 12.2 11.2* 13.4 13.9  HCT 37 34* 41.7 41.0  MCV  --   --  95.6  --   PLT 219 221 218  --    Lab Results  Component Value Date   TSH 1.80 12/06/2023   Lab Results  Component Value Date   HGBA1C 5.3 05/02/2013   Lab Results  Component Value Date   CHOL 188 07/10/2019   HDL 75 07/10/2019  LDLCALC 94 07/10/2019   TRIG 96 07/10/2019   CHOLHDL 2.5 07/10/2019    Significant Diagnostic Results in last 30 days:  No results found.  Assessment/Plan 1. Cellulitis of left leg (Primary) - ongoing, onset 05/13 - 07/16 hydrafera blue started, stopped about 10-14 days ago - today increased pain, erythema and slough in wound bed - culture wound - hold compression stockings x 2 weeks - start doxycycline  BID x 10 days - start hydrafera blue dressing changes daily   2. Subacute cough - ongoing since 09/2023 - unsuccessful trial prednisone - lung  sounds clear today - unsuccessful budesonide trial  - cont duonebs - consider referral to pulmonary or Trelegy, will obtain CT chest first - BNP 96 12/24/2023 - scheduled CT chest next week    Family/ staff Communication: plan discussed with patient and nurse  Labs/tests ordered:  none

## 2024-01-15 ENCOUNTER — Encounter: Payer: Self-pay | Admitting: Adult Health

## 2024-01-15 ENCOUNTER — Non-Acute Institutional Stay (SKILLED_NURSING_FACILITY): Payer: Self-pay | Admitting: Adult Health

## 2024-01-15 DIAGNOSIS — R600 Localized edema: Secondary | ICD-10-CM | POA: Diagnosis not present

## 2024-01-15 DIAGNOSIS — L03116 Cellulitis of left lower limb: Secondary | ICD-10-CM | POA: Diagnosis not present

## 2024-01-15 DIAGNOSIS — Z66 Do not resuscitate: Secondary | ICD-10-CM

## 2024-01-15 DIAGNOSIS — I10 Essential (primary) hypertension: Secondary | ICD-10-CM

## 2024-01-15 NOTE — Progress Notes (Signed)
 Location:  [facility] Room Number: N14-A [name]:  SNF ([phone]) Provider:  Phyllis Jereld BROCKS, NP  Patient Care Team: Charlanne Fredia CROME, MD as PCP - General (Internal Medicine) Court Dorn PARAS, MD as PCP - Cardiology (Cardiology) Celestia Agent, MD (Inactive) as Consulting Physician (Gastroenterology) Nicholaus Tanda CROME DOUGLAS, MD as Attending Physician (Urology) Court Dorn PARAS, MD as Consulting Physician (Cardiology) Ethyl Lonni BRAVO, MD (Inactive) as Consulting Physician (Otolaryngology) Clare Senior, DDS (Dentistry) Rosan Credit, MD as Consulting Physician (Ophthalmology) Shari Easter, MD as Consulting Physician (Orthopedic Surgery) Tanda Locus, MD as Consulting Physician (General Surgery)  Extended Emergency Contact Information Primary Emergency Contact: Mehaffey,Don Address: [address] [place] [address] [place] Home Phone: [phone] Mobile Phone: [phone] Relation: Son Secondary Emergency Contact: Bertrum CHRISTELLA Gibson Address: [address]          [place], [place] [address] [place] Home Phone: [phone] Work Phone: [phone] Mobile Phone: [phone] Relation: Daughter  Code Status:  DNR Goals of care: Advanced Directive information    01/11/2024   11:19 AM  Advanced Directives  Does Patient Have a Medical Advance Directive? Yes  Type of Advance Directive Out of facility DNR (pink MOST or yellow form)  Does patient want to make changes to medical advance directive? No - Patient declined  Pre-existing out of facility DNR order (yellow form or pink MOST form) Pink MOST form placed in chart (order not valid for inpatient use)     Chief Complaint  Patient presents with   Cellulitis    HPI:  Pt is a 89 y.o. female seen today for an acute visit for cellulitis. She is a resident of [id number]. She is currently on Doxycycline  100 mg PO BID x 10 dys, was  started on 01/11/24. She was then noted to have LLE nonhealing, erythematous, warm to touch wound.  Wound culture was done on 01/11/24 which showed many new gram negative bacilli, Ps. Aeruginosa and S. Aureus.   Patient has latest creatinine 0.69 and GFR 70 and 89 years old.  No reported fever nor chills.  She was seen in her room today. She stated that she has 4/10 pain on her wound site.  BP 134/86, takes metoprolol  succinate 100 mg daily for hypertension.  She currently has 2+ BLE and takes torsemide  20 mg daily for lower extremity edema.   Past Medical History:  Diagnosis Date   Cervicalgia    Chest pain at rest 01/07/2012   Displacement of cervical intervertebral disc without myelopathy    Diverticulosis of colon (without mention of hemorrhage)    Female stress incontinence    GERD (gastroesophageal reflux disease)    no peds occasional pepcid   Inguinal hernia without mention of obstruction or gangrene, unilateral or unspecified, (not specified as recurrent)    Internal hemorrhoids without mention of complication    Lumbago    Macular degeneration (senile) of retina, unspecified    Osteoarthrosis, unspecified whether generalized or localized, unspecified site    Other and unspecified hyperlipidemia    Pain in joint, hand    Palpitations    Reflux esophagitis    Scoliosis (and kyphoscoliosis), idiopathic    Senile osteoporosis    Skin disorder    Thyroid  disease    TIA (transient ischemic attack)    Unruptured popliteal cyst 06/24/2014   Right knee    Unspecified essential hypertension  Unspecified glaucoma(365.9)    Unspecified hypothyroidism    Unspecified vitamin D  deficiency    Past Surgical History:  Procedure Laterality Date   ABDOMINAL HYSTERECTOMY  1975   Dr Sharron   APPENDECTOMY  1988   cardiolite myocardial perfusion study     DOPPLER ECHOCARDIOGRAPHY     EYE SURGERY Bilateral 2009   cataract removed right eye, Dr Carrie   FOOT SURGERY  august 2013    Hewitt, MD   INGUINAL HERNIA REPAIR Bilateral    w/mesh   INGUINAL HERNIA REPAIR Left 08/03/2017   Procedure: LAPAROSCOPIC LEFT INGUINAL HERNIA REPAIR WITH MESH;  Surgeon: Tanda Locus, MD;  Location: [facility] OR;  Service: General;  Laterality: Left;   INGUINAL HERNIA REPAIR Right 08/03/2017   Procedure: HERNIA REPAIR RIGHT INGUINAL ADULT WITH MESH;  Surgeon: Tanda Locus, MD;  Location: [facility] OR;  Service: General;  Laterality: Right;   INGUINAL HERNIA REPAIR     Dr. Tanda 04-24-18   INGUINAL HERNIA REPAIR Right 04/24/2018   Procedure: DIAGNOSTIC LAPAROSCOPIC, OPENREPAIR OF RECURRENT RIGHT INGUINAL HERNIA WITH MESH ERAS PATHWAY;  Surgeon: Tanda Locus, MD;  Location: [facility] ORS;  Service: General;  Laterality: Right;   INSERTION OF MESH Bilateral 08/03/2017   Procedure: INSERTION OF MESH;  Surgeon: Tanda Locus, MD;  Location: [facility] OR;  Service: General;  Laterality: Bilateral;   NM MYOVIEW  LTD     negative   TONSILLECTOMY  1937   TOOTH EXTRACTION  09/16/13   Dr Maryjean    Allergies  Allergen Reactions   Trimethoprim Other (See Comments)    Headaches/ ear pressure    Erythromycin Other (See Comments)    UNSPECIFIED REACTION    Macrodantin [Nitrofurantoin Macrocrystal] Other (See Comments)    UNSPECIFIED REACTION    Other     Honeydew Melon   Penicillins Other (See Comments)    UNSPECIFIED REACTION  Has patient had a PCN reaction causing immediate rash, facial/tongue/throat swelling, SOB or lightheadedness with hypotension: Unknown Has patient had a PCN reaction causing severe rash involving mucus membranes or skin necrosis: Unknown Has patient had a PCN reaction that required hospitalization: No Has patient had a PCN reaction occurring within the last 10 years: No If all of the above answers are NO, then may proceed with Cephalosporin use.   Sulfa Antibiotics Other (See Comments)    UNSPECIFIED REACTION    Latex Itching    Outpatient Encounter Medications as of 01/15/2024  Medication Sig    acetaminophen  (TYLENOL ) 500 MG tablet Take 1,000 mg by mouth 3 (three) times daily.   albuterol  (VENTOLIN  HFA) 108 (90 Base) MCG/ACT inhaler Inhale 2 puffs into the lungs every 6 (six) hours as needed for wheezing or shortness of breath.   Amino Acids-Protein Hydrolys (PRO-STAT) LIQD Take 30 mLs by mouth in the morning.  Give 30 ml by mouth in the morning for wound healing for 3 Weeks qam x 21 days   cephALEXin  (KEFLEX ) 250 MG capsule Take 250 mg by mouth daily.   Cholecalciferol  (VITAMIN D3) 50 MCG (2000 UT) TABS Take 50 mcg by mouth daily.   ciprofloxacin  (CIPRO ) 500 MG tablet Take 500 mg by mouth 2 (two) times daily.   clopidogrel  (PLAVIX ) 75 MG tablet TAKE ONE TABLET BY MOUTH DAILY   Cranberry 500 MG TABS Take 1 tablet by mouth daily.   doxycycline  (DORYX ) 100 MG EC tablet Take 100 mg by mouth 2 (two) times daily.   guaiFENesin -dextromethorphan (ROBITUSSIN DM) 100-10 MG/5ML syrup Take 10 mLs by mouth  every 6 (six) hours as needed for cough.   ipratropium-albuterol  (DUONEB) 0.5-2.5 (3) MG/3ML SOLN Inhale 3 mLs into the lungs every 12 (twelve) hours as needed.   ipratropium-albuterol  (DUONEB) 0.5-2.5 (3) MG/3ML SOLN Take 3 mLs by nebulization 2 (two) times daily.   Menthol, Topical Analgesic, (BIOFREEZE ROLL-ON) 4 % GEL Apply 1 Application topically in the morning and at bedtime. Apply to right upper arm   Menthol, Topical Analgesic, (BIOFREEZE ROLL-ON) 4 % GEL Apply 1 application  topically every 12 (twelve) hours as needed. Apply to right knee   metoprolol  succinate (TOPROL -XL) 25 MG 24 hr tablet Take one tablet by mouth once daily to regulate heart and control blood pressure.   metroNIDAZOLE (FLAGYL) 500 MG tablet Take 500 mg by mouth 2 (two) times daily.   Multiple Vitamins-Minerals (PRESERVISION AREDS 2 PO) Take 1 capsule by mouth 2 (two) times daily.    pantoprazole  (PROTONIX ) 20 MG tablet Take 20 mg by mouth daily.   potassium chloride  (KLOR-CON ) 10 MEQ tablet Take 20 mEq by mouth  every morning.   torsemide  (DEMADEX ) 20 MG tablet Take 1 tablet (20 mg total) by mouth daily.   vitamin C (ASCORBIC ACID) 500 MG tablet Take 500 mg by mouth daily with supper.    Zinc Oxide 10 % OINT Apply 1 Application topically as needed.   omeprazole  (PRILOSEC) 20 MG capsule Take 1 capsule (20 mg total) by mouth daily. (Patient not taking: Reported on 01/15/2024)   No facility-administered encounter medications on file as of 01/15/2024.    Review of Systems  Constitutional:  Negative for appetite change, chills, fatigue and fever.  HENT:  Negative for congestion, hearing loss, rhinorrhea and sore throat.   Eyes: Negative.   Respiratory:  Negative for cough, shortness of breath and wheezing.   Cardiovascular:  Positive for leg swelling. Negative for chest pain and palpitations.  Gastrointestinal:  Negative for abdominal pain, constipation, diarrhea, nausea and vomiting.  Genitourinary:  Negative for dysuria.  Musculoskeletal:  Negative for arthralgias, back pain and myalgias.  Skin:  Negative for color change, rash and wound.  Neurological:  Negative for dizziness, weakness and headaches.  Psychiatric/Behavioral:  Negative for behavioral problems. The patient is not nervous/anxious.     Immunization History  Administered Date(s) Administered   Fluad Quad(high Dose 65+) 02/04/2019, 03/01/2022   INFLUENZA, HIGH DOSE SEASONAL PF 02/01/2017, 02/25/2018, 02/11/2020, 03/13/2023   Influenza Whole 02/24/2011, 02/12/2012   Influenza,inj,Quad PF,6+ Mos 02/20/2013, 02/10/2014, 02/19/2015, 02/18/2016   Influenza-Unspecified 02/16/2021   Moderna Covid-19 Fall Seasonal Vaccine 43yrs & older 02/09/2022, 03/21/2023   Moderna Sars-Covid-2 Vaccination 05/19/2019, 06/16/2019, 03/29/2020, 09/27/2020   PFIZER(Purple Top)SARS-COV-2 Vaccination 02/02/2021   Pneumococcal Conjugate-13 06/24/2014   Pneumococcal Polysaccharide-23 04/12/1998   Td 02/21/1996, 07/23/2003   Tdap 03/20/2010   Zoster  Recombinant(Shingrix) 09/19/2016, 12/02/2016   Zoster, Live 09/15/2005   Pertinent  Health Maintenance Due  Topic Date Due   INFLUENZA VACCINE  12/14/2023   DEXA SCAN  Completed      12/27/2022    2:15 PM 04/18/2023    4:49 PM 08/27/2023    2:51 PM 09/14/2023   12:18 PM 12/20/2023   11:31 AM  Fall Risk  Falls in the past year? 0 0 1 1 1   Was there an injury with Fall? 0 0 0 0 1  Fall Risk Category Calculator 0 0 1 1 3   Patient at Risk for Falls Due to No Fall Risks History of fall(s);Impaired balance/gait;Impaired mobility History of fall(s);Impaired mobility History of  fall(s);Impaired balance/gait History of fall(s);Impaired balance/gait;Impaired mobility  Fall risk Follow up Falls evaluation completed;Education provided;Falls prevention discussed Falls evaluation completed;Education provided;Falls prevention discussed Falls evaluation completed;Education provided Falls evaluation completed;Education provided Falls evaluation completed;Education provided   Functional Status Survey:    Vitals:   01/15/24 1048 01/15/24 1049  BP: (!) 164/81 134/86  Pulse: 79   Temp: 97.8 F (36.6 C)   SpO2: 94%   Weight: 187 lb 4.8 oz (85 kg)   Height: 5' 2 (1.575 m)    Body mass index is 34.26 kg/m. Physical Exam Constitutional:      General: She is not in acute distress.    Appearance: She is obese.  HENT:     Head: Normocephalic and atraumatic.     Nose: Nose normal.     Mouth/Throat:     Mouth: Mucous membranes are moist.  Eyes:     Conjunctiva/sclera: Conjunctivae normal.  Cardiovascular:     Rate and Rhythm: Normal rate and regular rhythm.  Pulmonary:     Effort: Pulmonary effort is normal.     Breath sounds: Normal breath sounds.  Abdominal:     General: Bowel sounds are normal.     Palpations: Abdomen is soft.  Musculoskeletal:        General: Swelling present.     Cervical back: Normal range of motion.     Right lower leg: Edema present.     Left lower leg: Edema present.      Comments: BLE 2+edema  Skin:    General: Skin is warm and dry.     Comments: Wound on left shin  Neurological:     Mental Status: She is alert.  Psychiatric:        Mood and Affect: Mood normal.        Behavior: Behavior normal.     Labs reviewed: Recent Labs    12/01/23 2237 12/01/23 2246 12/06/23 0000 12/24/23 0000  NA 139 137 141 142  K 4.2 4.1 3.8 4.1  CL 99  --  103 106  CO2 26  --  29* 29*  GLUCOSE 101*  --   --   --   BUN 17  --  26* 23*  CREATININE 0.80  --  0.7 0.7  CALCIUM  9.5  --  9.1 9.1   Recent Labs    03/05/23 0000 10/15/23 0000 12/01/23 2237  AST 14 13 22   ALT 16 13 17   ALKPHOS 71 54 53  BILITOT  --   --  0.8  PROT  --   --  6.8  ALBUMIN 3.4* 3.5 3.8   Recent Labs    03/05/23 0000 10/15/23 0000 12/01/23 2236 12/01/23 2246  WBC 7.4 6.9 10.0  --   NEUTROABS 4,151.00 3,436.00 6.7  --   HGB 12.2 11.2* 13.4 13.9  HCT 37 34* 41.7 41.0  MCV  --   --  95.6  --   PLT 219 221 218  --    Lab Results  Component Value Date   TSH 1.80 12/06/2023   Lab Results  Component Value Date   HGBA1C 5.3 05/02/2013   Lab Results  Component Value Date   CHOL 188 07/10/2019   HDL 75 07/10/2019   LDLCALC 94 07/10/2019   TRIG 96 07/10/2019   CHOLHDL 2.5 07/10/2019    Significant Diagnostic Results in last 30 days:  No results found.  Assessment/Plan  1. Cellulitis of left leg (Primary) -  per wound culture sensitivity patient is  sensitive to Cipro  - discontinue Doxycycline  -  start on Cipro  500 mg BID and Metronidazole 500 mg BID X 7 days -  BMP with I 3 days -  continue current wound treatment with Hydrofera blue daily  2. Essential hypertension -  BP stable -  continue Metoprolol  succinate 100 mg daily  3. Bilateral lower extremity edema -  contin Torsemide  20 mg daily  4. DNR (do not resuscitate) -  signed [facility] DNR in chart    Family/ staff Communication:   Discussed plan of care with charge nurse and  resident.  Labs/tests ordered:  BMP in 3 days

## 2024-01-15 NOTE — Progress Notes (Deleted)
 Location:  [facility] Room Number: N14-A [name]:  SNF ([phone]) Provider:  Phyllis Jereld BROCKS, NP  Patient Care Team: Charlanne Fredia CROME, MD as PCP - General (Internal Medicine) Court Dorn PARAS, MD as PCP - Cardiology (Cardiology) Celestia Agent, MD (Inactive) as Consulting Physician (Gastroenterology) Nicholaus Tanda CROME DOUGLAS, MD as Attending Physician (Urology) Court Dorn PARAS, MD as Consulting Physician (Cardiology) Ethyl Lonni BRAVO, MD (Inactive) as Consulting Physician (Otolaryngology) Clare Senior, DDS (Dentistry) Rosan Credit, MD as Consulting Physician (Ophthalmology) Shari Easter, MD as Consulting Physician (Orthopedic Surgery) Tanda Locus, MD as Consulting Physician (General Surgery)  Extended Emergency Contact Information Primary Emergency Contact: Spilman,Don Address: [address] [place] [address] [place] Home Phone: [phone] Mobile Phone: [phone] Relation: Son Secondary Emergency Contact: Bertrum CHRISTELLA Gibson Address: [address]          [place], [place] [address] [place] Home Phone: [phone] Work Phone: [phone] Mobile Phone: [phone] Relation: Daughter  Code Status:  DNR Goals of care: Advanced Directive information    01/11/2024   11:19 AM  Advanced Directives  Does Patient Have a Medical Advance Directive? Yes  Type of Advance Directive Out of facility DNR (pink MOST or yellow form)  Does patient want to make changes to medical advance directive? No - Patient declined  Pre-existing out of facility DNR order (yellow form or pink MOST form) Pink MOST form placed in chart (order not valid for inpatient use)     Chief Complaint  Patient presents with   Cellulitis    HPI:  Pt is a 89 y.o. female seen today for an acute visit for    Past Medical History:  Diagnosis Date   Cervicalgia    Chest pain at rest 01/07/2012   Displacement of  cervical intervertebral disc without myelopathy    Diverticulosis of colon (without mention of hemorrhage)    Female stress incontinence    GERD (gastroesophageal reflux disease)    no peds occasional pepcid   Inguinal hernia without mention of obstruction or gangrene, unilateral or unspecified, (not specified as recurrent)    Internal hemorrhoids without mention of complication    Lumbago    Macular degeneration (senile) of retina, unspecified    Osteoarthrosis, unspecified whether generalized or localized, unspecified site    Other and unspecified hyperlipidemia    Pain in joint, hand    Palpitations    Reflux esophagitis    Scoliosis (and kyphoscoliosis), idiopathic    Senile osteoporosis    Skin disorder    Thyroid  disease    TIA (transient ischemic attack)    Unruptured popliteal cyst 06/24/2014   Right knee    Unspecified essential hypertension    Unspecified glaucoma(365.9)    Unspecified hypothyroidism    Unspecified vitamin D  deficiency    Past Surgical History:  Procedure Laterality Date   ABDOMINAL HYSTERECTOMY  1975   Dr Sharron   APPENDECTOMY  1988   cardiolite myocardial perfusion study     DOPPLER ECHOCARDIOGRAPHY     EYE SURGERY Bilateral 2009   cataract removed right eye, Dr Carrie   FOOT SURGERY  august 2013   Hewitt, MD   INGUINAL HERNIA REPAIR Bilateral    w/mesh   INGUINAL HERNIA REPAIR Left 08/03/2017   Procedure: LAPAROSCOPIC LEFT INGUINAL HERNIA REPAIR WITH MESH;  Surgeon: Tanda Locus, MD;  Location: [facility] OR;  Service: General;  Laterality: Left;   INGUINAL  HERNIA REPAIR Right 08/03/2017   Procedure: HERNIA REPAIR RIGHT INGUINAL ADULT WITH MESH;  Surgeon: Tanda Locus, MD;  Location: [facility] OR;  Service: General;  Laterality: Right;   INGUINAL HERNIA REPAIR     Dr. Tanda 04-24-18   INGUINAL HERNIA REPAIR Right 04/24/2018   Procedure: DIAGNOSTIC LAPAROSCOPIC, OPENREPAIR OF RECURRENT RIGHT INGUINAL HERNIA WITH MESH ERAS PATHWAY;  Surgeon: Tanda Locus, MD;   Location: [facility] ORS;  Service: General;  Laterality: Right;   INSERTION OF MESH Bilateral 08/03/2017   Procedure: INSERTION OF MESH;  Surgeon: Tanda Locus, MD;  Location: [facility] OR;  Service: General;  Laterality: Bilateral;   NM MYOVIEW  LTD     negative   TONSILLECTOMY  1937   TOOTH EXTRACTION  09/16/13   Dr Maryjean    Allergies  Allergen Reactions   Trimethoprim Other (See Comments)    Headaches/ ear pressure    Erythromycin Other (See Comments)    UNSPECIFIED REACTION    Macrodantin [Nitrofurantoin Macrocrystal] Other (See Comments)    UNSPECIFIED REACTION    Other     Honeydew Melon   Penicillins Other (See Comments)    UNSPECIFIED REACTION  Has patient had a PCN reaction causing immediate rash, facial/tongue/throat swelling, SOB or lightheadedness with hypotension: Unknown Has patient had a PCN reaction causing severe rash involving mucus membranes or skin necrosis: Unknown Has patient had a PCN reaction that required hospitalization: No Has patient had a PCN reaction occurring within the last 10 years: No If all of the above answers are NO, then may proceed with Cephalosporin use.   Sulfa Antibiotics Other (See Comments)    UNSPECIFIED REACTION    Latex Itching    Outpatient Encounter Medications as of 01/15/2024  Medication Sig   acetaminophen  (TYLENOL ) 500 MG tablet Take 1,000 mg by mouth 3 (three) times daily.   albuterol  (VENTOLIN  HFA) 108 (90 Base) MCG/ACT inhaler Inhale 2 puffs into the lungs every 6 (six) hours as needed for wheezing or shortness of breath.   Amino Acids-Protein Hydrolys (PRO-STAT) LIQD Take 30 mLs by mouth in the morning.  Give 30 ml by mouth in the morning for wound healing for 3 Weeks qam x 21 days   cephALEXin  (KEFLEX ) 250 MG capsule Take 250 mg by mouth daily.   Cholecalciferol  (VITAMIN D3) 50 MCG (2000 UT) TABS Take 50 mcg by mouth daily.   clopidogrel  (PLAVIX ) 75 MG tablet TAKE ONE TABLET BY MOUTH DAILY   Cranberry 500 MG TABS Take 1 tablet by  mouth daily.   guaiFENesin -dextromethorphan (ROBITUSSIN DM) 100-10 MG/5ML syrup Take 10 mLs by mouth every 6 (six) hours as needed for cough.   ipratropium-albuterol  (DUONEB) 0.5-2.5 (3) MG/3ML SOLN Inhale 3 mLs into the lungs every 12 (twelve) hours as needed.   ipratropium-albuterol  (DUONEB) 0.5-2.5 (3) MG/3ML SOLN Take 3 mLs by nebulization 2 (two) times daily.   Menthol, Topical Analgesic, (BIOFREEZE ROLL-ON) 4 % GEL Apply 1 Application topically in the morning and at bedtime. Apply to right upper arm   Menthol, Topical Analgesic, (BIOFREEZE ROLL-ON) 4 % GEL Apply 1 application  topically every 12 (twelve) hours as needed. Apply to right knee   metoprolol  succinate (TOPROL -XL) 25 MG 24 hr tablet Take one tablet by mouth once daily to regulate heart and control blood pressure.   Multiple Vitamins-Minerals (PRESERVISION AREDS 2 PO) Take 1 capsule by mouth 2 (two) times daily.    omeprazole  (PRILOSEC) 20 MG capsule Take 1 capsule (20 mg total) by mouth daily. (Patient not taking:  Reported on 01/11/2024)   potassium chloride  (KLOR-CON ) 10 MEQ tablet Take 20 mEq by mouth every morning.   torsemide  (DEMADEX ) 20 MG tablet Take 1 tablet (20 mg total) by mouth daily.   vitamin C (ASCORBIC ACID) 500 MG tablet Take 500 mg by mouth daily with supper.    Zinc Oxide 10 % OINT Apply 1 Application topically as needed.   No facility-administered encounter medications on file as of 01/15/2024.    Review of Systems  Immunization History  Administered Date(s) Administered   Fluad Quad(high Dose 65+) 02/04/2019, 03/01/2022   INFLUENZA, HIGH DOSE SEASONAL PF 02/01/2017, 02/25/2018, 02/11/2020, 03/13/2023   Influenza Whole 02/24/2011, 02/12/2012   Influenza,inj,Quad PF,6+ Mos 02/20/2013, 02/10/2014, 02/19/2015, 02/18/2016   Influenza-Unspecified 02/16/2021   Moderna Covid-19 Fall Seasonal Vaccine 36yrs & older 02/09/2022, 03/21/2023   Moderna Sars-Covid-2 Vaccination 05/19/2019, 06/16/2019, 03/29/2020,  09/27/2020   PFIZER(Purple Top)SARS-COV-2 Vaccination 02/02/2021   Pneumococcal Conjugate-13 06/24/2014   Pneumococcal Polysaccharide-23 04/12/1998   Td 02/21/1996, 07/23/2003   Tdap 03/20/2010   Zoster Recombinant(Shingrix) 09/19/2016, 12/02/2016   Zoster, Live 09/15/2005   Pertinent  Health Maintenance Due  Topic Date Due   INFLUENZA VACCINE  12/14/2023   DEXA SCAN  Completed      12/27/2022    2:15 PM 04/18/2023    4:49 PM 08/27/2023    2:51 PM 09/14/2023   12:18 PM 12/20/2023   11:31 AM  Fall Risk  Falls in the past year? 0 0 1 1 1   Was there an injury with Fall? 0 0 0 0 1  Fall Risk Category Calculator 0 0 1 1 3   Patient at Risk for Falls Due to No Fall Risks History of fall(s);Impaired balance/gait;Impaired mobility History of fall(s);Impaired mobility History of fall(s);Impaired balance/gait History of fall(s);Impaired balance/gait;Impaired mobility  Fall risk Follow up Falls evaluation completed;Education provided;Falls prevention discussed Falls evaluation completed;Education provided;Falls prevention discussed Falls evaluation completed;Education provided Falls evaluation completed;Education provided Falls evaluation completed;Education provided   Functional Status Survey:    Vitals:   01/15/24 1048 01/15/24 1049  BP: (!) 164/81 134/86  Pulse: 79   Temp: 97.8 F (36.6 C)   SpO2: 94%   Weight: 187 lb 4.8 oz (85 kg)   Height: 5' 2 (1.575 m)    Body mass index is 34.26 kg/m. Physical Exam  Labs reviewed: Recent Labs    12/01/23 2237 12/01/23 2246 12/06/23 0000 12/24/23 0000  NA 139 137 141 142  K 4.2 4.1 3.8 4.1  CL 99  --  103 106  CO2 26  --  29* 29*  GLUCOSE 101*  --   --   --   BUN 17  --  26* 23*  CREATININE 0.80  --  0.7 0.7  CALCIUM  9.5  --  9.1 9.1   Recent Labs    03/05/23 0000 10/15/23 0000 12/01/23 2237  AST 14 13 22   ALT 16 13 17   ALKPHOS 71 54 53  BILITOT  --   --  0.8  PROT  --   --  6.8  ALBUMIN 3.4* 3.5 3.8   Recent Labs     03/05/23 0000 10/15/23 0000 12/01/23 2236 12/01/23 2246  WBC 7.4 6.9 10.0  --   NEUTROABS 4,151.00 3,436.00 6.7  --   HGB 12.2 11.2* 13.4 13.9  HCT 37 34* 41.7 41.0  MCV  --   --  95.6  --   PLT 219 221 218  --    Lab Results  Component Value Date  TSH 1.80 12/06/2023   Lab Results  Component Value Date   HGBA1C 5.3 05/02/2013   Lab Results  Component Value Date   CHOL 188 07/10/2019   HDL 75 07/10/2019   LDLCALC 94 07/10/2019   TRIG 96 07/10/2019   CHOLHDL 2.5 07/10/2019    Significant Diagnostic Results in last 30 days:  No results found.  Assessment/Plan 1. DNR (do not resuscitate) (Primary) ***    Family/ staff Communication: ***  Labs/tests ordered:  ***

## 2024-01-17 ENCOUNTER — Ambulatory Visit (HOSPITAL_BASED_OUTPATIENT_CLINIC_OR_DEPARTMENT_OTHER)
Admission: RE | Admit: 2024-01-17 | Discharge: 2024-01-17 | Disposition: A | Discharge status: Home / Self Care | Source: Ambulatory Visit | Attending: Orthopedic Surgery | Admitting: Orthopedic Surgery

## 2024-01-17 DIAGNOSIS — J849 Interstitial pulmonary disease, unspecified: Secondary | ICD-10-CM | POA: Diagnosis not present

## 2024-01-17 DIAGNOSIS — R062 Wheezing: Secondary | ICD-10-CM | POA: Insufficient documentation

## 2024-01-17 DIAGNOSIS — S2242XD Multiple fractures of ribs, left side, subsequent encounter for fracture with routine healing: Secondary | ICD-10-CM | POA: Diagnosis not present

## 2024-01-17 DIAGNOSIS — R052 Subacute cough: Secondary | ICD-10-CM | POA: Diagnosis present

## 2024-01-17 DIAGNOSIS — R053 Chronic cough: Secondary | ICD-10-CM | POA: Diagnosis not present

## 2024-01-17 DIAGNOSIS — I700 Atherosclerosis of aorta: Secondary | ICD-10-CM | POA: Diagnosis not present

## 2024-01-18 ENCOUNTER — Encounter: Payer: Self-pay | Admitting: Orthopedic Surgery

## 2024-01-18 DIAGNOSIS — L089 Local infection of the skin and subcutaneous tissue, unspecified: Secondary | ICD-10-CM | POA: Diagnosis not present

## 2024-01-18 LAB — BASIC METABOLIC PANEL WITH GFR
BUN: 26 — AB (ref 4–21)
CO2: 27 — AB (ref 13–22)
Chloride: 105 (ref 99–108)
Creatinine: 0.7 (ref 0.5–1.1)
Glucose: 99
Potassium: 3.8 meq/L (ref 3.5–5.1)
Sodium: 142 (ref 137–147)

## 2024-01-18 LAB — COMPREHENSIVE METABOLIC PANEL WITH GFR: Calcium: 9.4 (ref 8.7–10.7)

## 2024-01-18 NOTE — Progress Notes (Signed)
 This encounter was created in error - please disregard.

## 2024-01-18 NOTE — Progress Notes (Signed)
 Location:   [facility] Room Number: 14-A [name]:  SNF [phone]) Provider:  Greig Cluster, NP  PCP: Charlanne Fredia CROME, MD  Patient Care Team: Charlanne Fredia CROME, MD as PCP - General (Internal Medicine) Court Dorn PARAS, MD as PCP - Cardiology (Cardiology) Celestia Agent, MD (Inactive) as Consulting Physician (Gastroenterology) Nicholaus Tanda CROME DOUGLAS, MD as Attending Physician (Urology) Court Dorn PARAS, MD as Consulting Physician (Cardiology) Ethyl Lonni BRAVO, MD (Inactive) as Consulting Physician (Otolaryngology) Clare Senior, DDS (Dentistry) Rosan Credit, MD as Consulting Physician (Ophthalmology) Shari Easter, MD as Consulting Physician (Orthopedic Surgery) Tanda Locus, MD as Consulting Physician (General Surgery)  Extended Emergency Contact Information Primary Emergency Contact: [facility] Address: [address] [place] [address] [place] Home Phone: [phone] Mobile Phone: [phone] Relation: Son Secondary Emergency Contact: Bertrum CHRISTELLA Gibson Address: [address]          [place], [place] [address] [place] Home Phone: [phone] Work Phone: [phone] Mobile Phone: [phone] Relation: Daughter  Code Status:  DNR Goals of care: Advanced Directive information    01/18/2024   11:02 AM  Advanced Directives  Does Patient Have a Medical Advance Directive? Yes  Type of Estate agent of [employer];Living will;Out of facility DNR (pink MOST or yellow form)  Does patient want to make changes to medical advance directive? No - Patient declined  Copy of Healthcare Power of Attorney in Chart? Yes - validated most recent copy scanned in chart (See row information)     Chief Complaint  Patient presents with   Shortness of Breath   Cough    HPI:  Pt is a 89 y.o. female seen today for an acute visit for    Past Medical History:  Diagnosis Date   Cervicalgia     Chest pain at rest 01/07/2012   Displacement of cervical intervertebral disc without myelopathy    Diverticulosis of colon (without mention of hemorrhage)    Female stress incontinence    GERD (gastroesophageal reflux disease)    no peds occasional pepcid   Inguinal hernia without mention of obstruction or gangrene, unilateral or unspecified, (not specified as recurrent)    Internal hemorrhoids without mention of complication    Lumbago    Macular degeneration (senile) of retina, unspecified    Osteoarthrosis, unspecified whether generalized or localized, unspecified site    Other and unspecified hyperlipidemia    Pain in joint, hand    Palpitations    Reflux esophagitis    Scoliosis (and kyphoscoliosis), idiopathic    Senile osteoporosis    Skin disorder    Thyroid  disease    TIA (transient ischemic attack)    Unruptured popliteal cyst 06/24/2014   Right knee    Unspecified essential hypertension    Unspecified glaucoma(365.9)    Unspecified hypothyroidism    Unspecified vitamin D  deficiency    Past Surgical History:  Procedure Laterality Date   ABDOMINAL HYSTERECTOMY  1975   Dr Sharron   APPENDECTOMY  1988   cardiolite myocardial perfusion study     DOPPLER ECHOCARDIOGRAPHY     EYE SURGERY Bilateral 2009   cataract removed right eye, Dr Carrie   FOOT SURGERY  august 2013   Hewitt, MD   INGUINAL HERNIA REPAIR Bilateral    w/mesh   INGUINAL HERNIA REPAIR Left 08/03/2017   Procedure: LAPAROSCOPIC LEFT INGUINAL HERNIA REPAIR WITH MESH;  Surgeon: Tanda Locus, MD;  Location:  [facility] OR;  Service: General;  Laterality: Left;   INGUINAL HERNIA REPAIR Right 08/03/2017   Procedure: HERNIA REPAIR RIGHT INGUINAL ADULT WITH MESH;  Surgeon: Tanda Locus, MD;  Location: [facility] OR;  Service: General;  Laterality: Right;   INGUINAL HERNIA REPAIR     Dr. Tanda 04-24-18   INGUINAL HERNIA REPAIR Right 04/24/2018   Procedure: DIAGNOSTIC LAPAROSCOPIC, OPENREPAIR OF RECURRENT RIGHT INGUINAL HERNIA WITH  MESH ERAS PATHWAY;  Surgeon: Tanda Locus, MD;  Location: [facility] ORS;  Service: General;  Laterality: Right;   INSERTION OF MESH Bilateral 08/03/2017   Procedure: INSERTION OF MESH;  Surgeon: Tanda Locus, MD;  Location: [facility] OR;  Service: General;  Laterality: Bilateral;   NM MYOVIEW  LTD     negative   TONSILLECTOMY  1937   TOOTH EXTRACTION  09/16/13   Dr Maryjean    Allergies  Allergen Reactions   Trimethoprim Other (See Comments)    Headaches/ ear pressure    Erythromycin Other (See Comments)    UNSPECIFIED REACTION    Macrodantin [Nitrofurantoin Macrocrystal] Other (See Comments)    UNSPECIFIED REACTION    Other     Honeydew Melon   Penicillins Other (See Comments)    UNSPECIFIED REACTION  Has patient had a PCN reaction causing immediate rash, facial/tongue/throat swelling, SOB or lightheadedness with hypotension: Unknown Has patient had a PCN reaction causing severe rash involving mucus membranes or skin necrosis: Unknown Has patient had a PCN reaction that required hospitalization: No Has patient had a PCN reaction occurring within the last 10 years: No If all of the above answers are NO, then may proceed with Cephalosporin use.   Sulfa Antibiotics Other (See Comments)    UNSPECIFIED REACTION    Latex Itching    Allergies as of 01/18/2024       Reactions   Trimethoprim Other (See Comments)   Headaches/ ear pressure    Erythromycin Other (See Comments)   UNSPECIFIED REACTION    Macrodantin [nitrofurantoin Macrocrystal] Other (See Comments)   UNSPECIFIED REACTION    Other    Honeydew Melon   Penicillins Other (See Comments)   UNSPECIFIED REACTION  Has patient had a PCN reaction causing immediate rash, facial/tongue/throat swelling, SOB or lightheadedness with hypotension: Unknown Has patient had a PCN reaction causing severe rash involving mucus membranes or skin necrosis: Unknown Has patient had a PCN reaction that required hospitalization: No Has patient had a PCN  reaction occurring within the last 10 years: No If all of the above answers are NO, then may proceed with Cephalosporin use.   Sulfa Antibiotics Other (See Comments)   UNSPECIFIED REACTION    Latex Itching        Medication List        Accurate as of January 18, 2024 11:09 AM. If you have any questions, ask your nurse or doctor.          acetaminophen  500 MG tablet Commonly known as: TYLENOL  Take 1,000 mg by mouth 3 (three) times daily.   albuterol  108 (90 Base) MCG/ACT inhaler Commonly known as: VENTOLIN  HFA Inhale 2 puffs into the lungs every 6 (six) hours as needed for wheezing or shortness of breath.   ascorbic acid 500 MG tablet Commonly known as: VITAMIN C Take 500 mg by mouth daily with supper.   Biofreeze Roll-On 4 % Gel Generic drug: Menthol (Topical Analgesic) Apply 1 Application topically in the morning and at bedtime. Apply to right upper arm   Biofreeze Roll-On 4 % Gel Generic drug:  Menthol (Topical Analgesic) Apply 1 application  topically every 12 (twelve) hours as needed. Apply to right knee   cephALEXin  250 MG capsule Commonly known as: KEFLEX  Take 250 mg by mouth daily.   ciprofloxacin  500 MG tablet Commonly known as: CIPRO  Take 500 mg by mouth 2 (two) times daily.   clopidogrel  75 MG tablet Commonly known as: PLAVIX  TAKE ONE TABLET BY MOUTH DAILY   Cranberry 500 MG Tabs Take 1 tablet by mouth daily.   doxycycline  100 MG EC tablet Commonly known as: DORYX  Take 100 mg by mouth 2 (two) times daily.   guaiFENesin -dextromethorphan 100-10 MG/5ML syrup Commonly known as: ROBITUSSIN DM Take 10 mLs by mouth every 6 (six) hours as needed for cough.   ipratropium-albuterol  0.5-2.5 (3) MG/3ML Soln Commonly known as: DUONEB Inhale 3 mLs into the lungs every 12 (twelve) hours as needed.   ipratropium-albuterol  0.5-2.5 (3) MG/3ML Soln Commonly known as: DUONEB Take 3 mLs by nebulization 2 (two) times daily.   metoprolol  succinate 25 MG 24  hr tablet Commonly known as: TOPROL -XL Take one tablet by mouth once daily to regulate heart and control blood pressure.   metroNIDAZOLE 500 MG tablet Commonly known as: FLAGYL Take 500 mg by mouth 2 (two) times daily.   pantoprazole  20 MG tablet Commonly known as: PROTONIX  Take 20 mg by mouth daily.   potassium chloride  10 MEQ tablet Commonly known as: KLOR-CON  Take 20 mEq by mouth every morning.   PRESERVISION AREDS 2 PO Take 1 capsule by mouth 2 (two) times daily.   Pro-Stat Liqd Take 30 mLs by mouth in the morning.  Give 30 ml by mouth in the morning for wound healing for 3 Weeks qam x 21 days   torsemide  20 MG tablet Commonly known as: DEMADEX  Take 1 tablet (20 mg total) by mouth daily.   Vitamin D3 50 MCG (2000 UT) Tabs Take 50 mcg by mouth daily.   Zinc Oxide 10 % Oint Apply 1 Application topically as needed.        Review of Systems  Immunization History  Administered Date(s) Administered   Fluad Quad(high Dose 65+) 02/04/2019, 03/01/2022   INFLUENZA, HIGH DOSE SEASONAL PF 02/01/2017, 02/25/2018, 02/11/2020, 03/13/2023   Influenza Whole 02/24/2011, 02/12/2012   Influenza,inj,Quad PF,6+ Mos 02/20/2013, 02/10/2014, 02/19/2015, 02/18/2016   Influenza-Unspecified 02/16/2021   Moderna Covid-19 Fall Seasonal Vaccine 44yrs & older 02/09/2022, 03/21/2023   Moderna Sars-Covid-2 Vaccination 05/19/2019, 06/16/2019, 03/29/2020, 09/27/2020   PFIZER(Purple Top)SARS-COV-2 Vaccination 02/02/2021   Pneumococcal Conjugate-13 06/24/2014   Pneumococcal Polysaccharide-23 04/12/1998   Td 02/21/1996, 07/23/2003   Tdap 03/20/2010   Zoster Recombinant(Shingrix) 09/19/2016, 12/02/2016   Zoster, Live 09/15/2005   Pertinent  Health Maintenance Due  Topic Date Due   Influenza Vaccine  12/14/2023   DEXA SCAN  Completed      12/27/2022    2:15 PM 04/18/2023    4:49 PM 08/27/2023    2:51 PM 09/14/2023   12:18 PM 12/20/2023   11:31 AM  Fall Risk  Falls in the past year? 0 0 1 1 1    Was there an injury with Fall? 0 0 0 0 1  Fall Risk Category Calculator 0 0 1 1 3   Patient at Risk for Falls Due to No Fall Risks History of fall(s);Impaired balance/gait;Impaired mobility History of fall(s);Impaired mobility History of fall(s);Impaired balance/gait History of fall(s);Impaired balance/gait;Impaired mobility  Fall risk Follow up Falls evaluation completed;Education provided;Falls prevention discussed Falls evaluation completed;Education provided;Falls prevention discussed Falls evaluation completed;Education provided Falls evaluation completed;Education  provided Falls evaluation completed;Education provided   Functional Status Survey:    Vitals:   01/18/24 1101  BP: (!) 159/96  Pulse: 80  Resp: 16  Temp: (!) 97.1 F (36.2 C)  SpO2: 96%  Weight: 186 lb 3.2 oz (84.5 kg)  Height: 5' 2 (1.575 m)   Body mass index is 34.06 kg/m. Physical Exam  Labs reviewed: Recent Labs    12/01/23 2237 12/01/23 2246 12/06/23 0000 12/24/23 0000  NA 139 137 141 142  K 4.2 4.1 3.8 4.1  CL 99  --  103 106  CO2 26  --  29* 29*  GLUCOSE 101*  --   --   --   BUN 17  --  26* 23*  CREATININE 0.80  --  0.7 0.7  CALCIUM  9.5  --  9.1 9.1   Recent Labs    03/05/23 0000 10/15/23 0000 12/01/23 2237  AST 14 13 22   ALT 16 13 17   ALKPHOS 71 54 53  BILITOT  --   --  0.8  PROT  --   --  6.8  ALBUMIN 3.4* 3.5 3.8   Recent Labs    03/05/23 0000 10/15/23 0000 12/01/23 2236 12/01/23 2246  WBC 7.4 6.9 10.0  --   NEUTROABS 4,151.00 3,436.00 6.7  --   HGB 12.2 11.2* 13.4 13.9  HCT 37 34* 41.7 41.0  MCV  --   --  95.6  --   PLT 219 221 218  --    Lab Results  Component Value Date   TSH 1.80 12/06/2023   Lab Results  Component Value Date   HGBA1C 5.3 05/02/2013   Lab Results  Component Value Date   CHOL 188 07/10/2019   HDL 75 07/10/2019   LDLCALC 94 07/10/2019   TRIG 96 07/10/2019   CHOLHDL 2.5 07/10/2019    Significant Diagnostic Results in last 30 days:  CT Chest  Wo Contrast Result Date: 01/17/2024 CLINICAL DATA:  Chronic cough. EXAM: CT CHEST WITHOUT CONTRAST TECHNIQUE: Multidetector CT imaging of the chest was performed following the standard protocol without IV contrast. RADIATION DOSE REDUCTION: This exam was performed according to the departmental dose-optimization program which includes automated exposure control, adjustment of the mA and/or kV according to patient size and/or use of iterative reconstruction technique. COMPARISON:  Remote chest CT from 2013. FINDINGS: Cardiovascular: The heart is normal in size for age. No pericardial effusion. Tortuosity and calcification of the thoracic aorta but no aneurysm. LAD calcifications are noted. Calcifications around the aortic valve are present. Mediastinum/Nodes: Small scattered mediastinal lymph nodes but no mass or overt adenopathy. The esophagus is grossly no. The thyroid  gland is unremarkable. Lungs/Pleura: Chronic basilar interstitial lung disease with AP peripheral fine reticulonodular pattern and microcalcifications. Mild upper lobe predominant bronchiectasis. Superior segment right lower lobe ill-defined sub solid nodular lesion measuring approximately 3.2 x 2.2 cm. It is possible this is an infiltrate but I would be more worried about a potential adenocarcinoma. Depending on the patient's clinical situation, a PET-CT may be helpful for further evaluation. Short-term (3 month) follow-up chest CT would be another reasonable approach in this patient given her age. Upper Abdomen: Examination limited due to breathing motion artifact. No gross abnormalities. Aortic calcifications are noted. Musculoskeletal: There are multiple subacute/healing left posterior rib fractures (5 through 9). Question recent trauma. No lytic or sclerotic lesions. Age related osteoporosis and degenerative disc disease. IMPRESSION: 1. Chronic basilar predominant interstitial lung disease. Mild upper lobe predominant bronchiectasis. 2. Superior  segment  right lower lobe ill-defined sub solid nodular lesion measuring approximately 3.2 x 2.2 cm. It is possible this is an infiltrate but I would be more worried about a potential adenocarcinoma. Depending on the patient's clinical situation, a PET-CT may be helpful for further evaluation. Short-term (3 month) follow-up chest CT would be another reasonable approach in this patient, given her age. 3. No mediastinal or hilar mass or adenopathy. 4. Subacute left posterior rib fractures (5 through 9). Recommend correlation with any recent trauma. 5. Aortic atherosclerosis. Aortic Atherosclerosis (ICD10-I70.0). Electronically Signed   By: MYRTIS Stammer M.D.   On: 01/17/2024 15:08    Assessment/Plan There are no diagnoses linked to this encounter.   Family/ staff Communication:   Labs/tests ordered:

## 2024-01-22 ENCOUNTER — Ambulatory Visit (INDEPENDENT_AMBULATORY_CARE_PROVIDER_SITE_OTHER): Admitting: Otolaryngology

## 2024-01-23 ENCOUNTER — Encounter: Payer: Self-pay | Admitting: Orthopedic Surgery

## 2024-01-23 ENCOUNTER — Non-Acute Institutional Stay (SKILLED_NURSING_FACILITY): Payer: Self-pay | Admitting: Orthopedic Surgery

## 2024-01-23 DIAGNOSIS — L03116 Cellulitis of left lower limb: Secondary | ICD-10-CM

## 2024-01-23 DIAGNOSIS — Z23 Encounter for immunization: Secondary | ICD-10-CM | POA: Diagnosis not present

## 2024-01-23 DIAGNOSIS — R911 Solitary pulmonary nodule: Secondary | ICD-10-CM

## 2024-01-23 DIAGNOSIS — J479 Bronchiectasis, uncomplicated: Secondary | ICD-10-CM | POA: Diagnosis not present

## 2024-01-23 MED ORDER — TRELEGY ELLIPTA 100-62.5-25 MCG/ACT IN AEPB: 1.0000 | INHALATION_SPRAY | Freq: Every day | RESPIRATORY_TRACT | Status: AC

## 2024-01-23 NOTE — Progress Notes (Signed)
 Location:   [facility]  Nursing Home Room Number: 14-A [name]:  SNF [phone]) Provider:  Greig Cluster, NP  PCP: Charlanne Fredia CROME, MD  Patient Care Team: Charlanne Fredia CROME, MD as PCP - General (Internal Medicine) Court Dorn PARAS, MD as PCP - Cardiology (Cardiology) Celestia Agent, MD (Inactive) as Consulting Physician (Gastroenterology) Nicholaus Tanda CROME DOUGLAS, MD as Attending Physician (Urology) Court Dorn PARAS, MD as Consulting Physician (Cardiology) Ethyl Lonni BRAVO, MD (Inactive) as Consulting Physician (Otolaryngology) Clare Senior, DDS (Dentistry) Rosan Credit, MD as Consulting Physician (Ophthalmology) Shari Easter, MD as Consulting Physician (Orthopedic Surgery) Tanda Locus, MD as Consulting Physician (General Surgery)  Extended Emergency Contact Information Primary Emergency Contact: Morden,Don Address: [address] [place] [address] [place] Home Phone: [phone] Mobile Phone: [phone] Relation: Son Secondary Emergency Contact: Bertrum CHRISTELLA Gibson Address: [address]          [place], [place] [address] [place] Home Phone: [phone] Work Phone: [phone] Mobile Phone: [phone] Relation: Daughter  Code Status:  DNR Goals of care: Advanced Directive information    01/23/2024   12:01 PM  Advanced Directives  Does Patient Have a Medical Advance Directive? Yes  Type of Estate agent of [place];Living will;Out of facility DNR (pink MOST or yellow form)  Does patient want to make changes to medical advance directive? No - Patient declined  Copy of Healthcare Power of Attorney in Chart? Yes - validated most recent copy scanned in chart (See row information)     Chief Complaint  Patient presents with   Abnormal CT    HPI:  Pt is a 89 y.o. female seen today for acute visit due to abnormal CT scan.   She currently resides on the skilled nursing unit at  [facility] PMH: hypertension, hyperlipidemia, macular degeneration, mild cognitive impairment, recurrent UTI, GERD, H/o TIA.   Intermittent wheezing and subacute cough since 09/2023. Unsuccessful trial doxycycline , prednisone, budesonide. 09/04 CT chest noted chronic basilar interstitial lung disease, mild upper lobe bronchiectasis and pulmonary nodule 3.2 x 2.2 cm. Follow up CT chest recommended in 3 months. She continues to have coughing and wheezing spells. She is prescribed duonebs twice daily with some relief. Afebrile. Vitals stable.   Patient reports she never smoked.    Past Medical History:  Diagnosis Date   Cervicalgia    Chest pain at rest 01/07/2012   Displacement of cervical intervertebral disc without myelopathy    Diverticulosis of colon (without mention of hemorrhage)    Female stress incontinence    GERD (gastroesophageal reflux disease)    no peds occasional pepcid   Inguinal hernia without mention of obstruction or gangrene, unilateral or unspecified, (not specified as recurrent)    Internal hemorrhoids without mention of complication    Lumbago    Macular degeneration (senile) of retina, unspecified    Osteoarthrosis, unspecified whether generalized or localized, unspecified site    Other and unspecified hyperlipidemia    Pain in joint, hand    Palpitations    Reflux esophagitis    Scoliosis (and kyphoscoliosis), idiopathic    Senile osteoporosis    Skin disorder    Thyroid  disease    TIA (transient ischemic attack)    Unruptured popliteal cyst 06/24/2014   Right knee    Unspecified essential hypertension    Unspecified glaucoma(365.9)    Unspecified hypothyroidism    Unspecified vitamin D  deficiency  Past Surgical History:  Procedure Laterality Date   ABDOMINAL HYSTERECTOMY  1975   Dr Sharron   APPENDECTOMY  1988   cardiolite myocardial perfusion study     DOPPLER ECHOCARDIOGRAPHY     EYE SURGERY Bilateral 2009   cataract removed right eye, Dr  Carrie   FOOT SURGERY  august 2013   Hewitt, MD   INGUINAL HERNIA REPAIR Bilateral    w/mesh   INGUINAL HERNIA REPAIR Left 08/03/2017   Procedure: LAPAROSCOPIC LEFT INGUINAL HERNIA REPAIR WITH MESH;  Surgeon: Tanda Locus, MD;  Location[facility] OR;  Service: General;  Laterality: Left;   INGUINAL HERNIA REPAIR Right 08/03/2017   Procedure: HERNIA REPAIR RIGHT INGUINAL ADULT WITH MESH;  Surgeon: Tanda Locus, MD;  Location: [facility] OR;  Service: General;  Laterality: Right;   INGUINAL HERNIA REPAIR     Dr. Tanda 04-24-18   INGUINAL HERNIA REPAIR Right 04/24/2018   Procedure: DIAGNOSTIC LAPAROSCOPIC, OPENREPAIR OF RECURRENT RIGHT INGUINAL HERNIA WITH MESH ERAS PATHWAY;  Surgeon: Tanda Locus, MD;  Location: [facility] ORS;  Service: General;  Laterality: Right;   INSERTION OF MESH Bilateral 08/03/2017   Procedure: INSERTION OF MESH;  Surgeon: Tanda Locus, MD;  Location: [facility] OR;  Service: General;  Laterality: Bilateral;   NM MYOVIEW  LTD     negative   TONSILLECTOMY  1937   TOOTH EXTRACTION  09/16/13   Dr Maryjean    Allergies  Allergen Reactions   Trimethoprim Other (See Comments)    Headaches/ ear pressure    Erythromycin Other (See Comments)    UNSPECIFIED REACTION    Macrodantin [Nitrofurantoin Macrocrystal] Other (See Comments)    UNSPECIFIED REACTION    Other     Honeydew Melon   Penicillins Other (See Comments)    UNSPECIFIED REACTION  Has patient had a PCN reaction causing immediate rash, facial/tongue/throat swelling, SOB or lightheadedness with hypotension: Unknown Has patient had a PCN reaction causing severe rash involving mucus membranes or skin necrosis: Unknown Has patient had a PCN reaction that required hospitalization: No Has patient had a PCN reaction occurring within the last 10 years: No If all of the above answers are NO, then may proceed with Cephalosporin use.   Sulfa Antibiotics Other (See Comments)    UNSPECIFIED REACTION    Latex Itching    Allergies as of 01/23/2024        Reactions   Trimethoprim Other (See Comments)   Headaches/ ear pressure    Erythromycin Other (See Comments)   UNSPECIFIED REACTION    Macrodantin [nitrofurantoin Macrocrystal] Other (See Comments)   UNSPECIFIED REACTION    Other    Honeydew Melon   Penicillins Other (See Comments)   UNSPECIFIED REACTION  Has patient had a PCN reaction causing immediate rash, facial/tongue/throat swelling, SOB or lightheadedness with hypotension: Unknown Has patient had a PCN reaction causing severe rash involving mucus membranes or skin necrosis: Unknown Has patient had a PCN reaction that required hospitalization: No Has patient had a PCN reaction occurring within the last 10 years: No If all of the above answers are NO, then may proceed with Cephalosporin use.   Sulfa Antibiotics Other (See Comments)   UNSPECIFIED REACTION    Latex Itching        Medication List        Accurate as of January 23, 2024 12:01 PM. If you have any questions, ask your nurse or doctor.          acetaminophen  500 MG tablet Commonly known as: TYLENOL  Take  1,000 mg by mouth 3 (three) times daily.   albuterol  108 (90 Base) MCG/ACT inhaler Commonly known as: VENTOLIN  HFA Inhale 2 puffs into the lungs every 6 (six) hours as needed for wheezing or shortness of breath.   ascorbic acid 500 MG tablet Commonly known as: VITAMIN C Take 500 mg by mouth daily with supper.   Biofreeze Roll-On 4 % Gel Generic drug: Menthol (Topical Analgesic) Apply 1 Application topically in the morning and at bedtime. Apply to right upper arm   Biofreeze Roll-On 4 % Gel Generic drug: Menthol (Topical Analgesic) Apply 1 application  topically every 12 (twelve) hours as needed. Apply to right knee   cephALEXin  250 MG capsule Commonly known as: KEFLEX  Take 250 mg by mouth daily.   ciprofloxacin  500 MG tablet Commonly known as: CIPRO  Take 500 mg by mouth 2 (two) times daily.   clopidogrel  75 MG tablet Commonly known  as: PLAVIX  TAKE ONE TABLET BY MOUTH DAILY   Cranberry 500 MG Tabs Take 1 tablet by mouth daily.   doxycycline  100 MG EC tablet Commonly known as: DORYX  Take 100 mg by mouth 2 (two) times daily.   guaiFENesin -dextromethorphan 100-10 MG/5ML syrup Commonly known as: ROBITUSSIN DM Take 10 mLs by mouth every 6 (six) hours as needed for cough.   ipratropium-albuterol  0.5-2.5 (3) MG/3ML Soln Commonly known as: DUONEB Inhale 3 mLs into the lungs every 12 (twelve) hours as needed.   ipratropium-albuterol  0.5-2.5 (3) MG/3ML Soln Commonly known as: DUONEB Take 3 mLs by nebulization 2 (two) times daily.   metoprolol  succinate 25 MG 24 hr tablet Commonly known as: TOPROL -XL Take one tablet by mouth once daily to regulate heart and control blood pressure.   metroNIDAZOLE 500 MG tablet Commonly known as: FLAGYL Take 500 mg by mouth 2 (two) times daily.   pantoprazole  20 MG tablet Commonly known as: PROTONIX  Take 20 mg by mouth daily.   potassium chloride  10 MEQ tablet Commonly known as: KLOR-CON  Take 20 mEq by mouth every morning.   potassium chloride  SA 20 MEQ tablet Commonly known as: KLOR-CON  M Take 20 mEq by mouth daily.   PRESERVISION AREDS 2 PO Take 1 capsule by mouth 2 (two) times daily.   Pro-Stat Liqd Take 30 mLs by mouth in the morning.  Give 30 ml by mouth in the morning for wound healing for 3 Weeks qam x 21 days   torsemide  20 MG tablet Commonly known as: DEMADEX  Take 1 tablet (20 mg total) by mouth daily.   Vitamin D3 50 MCG (2000 UT) Tabs Take 50 mcg by mouth daily.   Zinc Oxide 10 % Oint Apply 1 Application topically as needed.        Review of Systems  Immunization History  Administered Date(s) Administered   Fluad Quad(high Dose 65+) 02/04/2019, 03/01/2022   INFLUENZA, HIGH DOSE SEASONAL PF 02/01/2017, 02/25/2018, 02/11/2020, 03/13/2023   Influenza Whole 02/24/2011, 02/12/2012   Influenza,inj,Quad PF,6+ Mos 02/20/2013, 02/10/2014, 02/19/2015,  02/18/2016   Influenza-Unspecified 02/16/2021   Moderna Covid-19 Fall Seasonal Vaccine 55yrs & older 02/09/2022, 03/21/2023   Moderna Sars-Covid-2 Vaccination 05/19/2019, 06/16/2019, 03/29/2020, 09/27/2020   PFIZER(Purple Top)SARS-COV-2 Vaccination 02/02/2021   Pneumococcal Conjugate-13 06/24/2014   Pneumococcal Polysaccharide-23 04/12/1998   Td 02/21/1996, 07/23/2003   Tdap 03/20/2010   Zoster Recombinant(Shingrix) 09/19/2016, 12/02/2016   Zoster, Live 09/15/2005   Pertinent  Health Maintenance Due  Topic Date Due   Influenza Vaccine  12/14/2023   DEXA SCAN  Completed      12/27/2022  2:15 PM 04/18/2023    4:49 PM 08/27/2023    2:51 PM 09/14/2023   12:18 PM 12/20/2023   11:31 AM  Fall Risk  Falls in the past year? 0 0 1 1 1   Was there an injury with Fall? 0 0 0 0 1  Fall Risk Category Calculator 0 0 1 1 3   Patient at Risk for Falls Due to No Fall Risks History of fall(s);Impaired balance/gait;Impaired mobility History of fall(s);Impaired mobility History of fall(s);Impaired balance/gait History of fall(s);Impaired balance/gait;Impaired mobility  Fall risk Follow up Falls evaluation completed;Education provided;Falls prevention discussed Falls evaluation completed;Education provided;Falls prevention discussed Falls evaluation completed;Education provided Falls evaluation completed;Education provided Falls evaluation completed;Education provided   Functional Status Survey:    Vitals:   01/23/24 1154  BP: (!) 145/85  Pulse: 77  Resp: 19  Temp: (!) 97.2 F (36.2 C)  SpO2: 96%  Weight: 186 lb 14.4 oz (84.8 kg)  Height: 5' 2 (1.575 m)   Body mass index is 34.18 kg/m. Physical Exam Vitals reviewed.  Constitutional:      General: She is not in acute distress. HENT:     Head: Normocephalic.  Eyes:     General:        Right eye: No discharge.        Left eye: No discharge.  Cardiovascular:     Rate and Rhythm: Normal rate and regular rhythm.     Pulses: Normal pulses.      Heart sounds: Normal heart sounds.  Pulmonary:     Effort: Pulmonary effort is normal. No respiratory distress.     Breath sounds: Wheezing present. No rhonchi or rales.  Abdominal:     General: Bowel sounds are normal. There is no distension.     Palpations: Abdomen is soft.     Tenderness: There is no abdominal tenderness.  Musculoskeletal:     Cervical back: Neck supple.     Right lower leg: Edema present.     Left lower leg: Edema present.     Comments: Non pitting  Skin:    General: Skin is warm.     Capillary Refill: Capillary refill takes less than 2 seconds.     Comments: Left lower leg cellulitis with decreased erythema, swelling and tenderness, no purulent drainage  Neurological:     General: No focal deficit present.     Mental Status: She is alert and oriented to person, place, and time.     Gait: Gait abnormal.  Psychiatric:        Mood and Affect: Mood normal.     Labs reviewed: Recent Labs    12/01/23 2237 12/01/23 2246 12/06/23 0000 12/24/23 0000 01/18/24 0000  NA 139   < > 141 142 142  K 4.2   < > 3.8 4.1 3.8  CL 99  --  103 106 105  CO2 26  --  29* 29* 27*  GLUCOSE 101*  --   --   --   --   BUN 17  --  26* 23* 26*  CREATININE 0.80  --  0.7 0.7 0.7  CALCIUM  9.5  --  9.1 9.1 9.4   < > = values in this interval not displayed.   Recent Labs    03/05/23 0000 10/15/23 0000 12/01/23 2237  AST 14 13 22   ALT 16 13 17   ALKPHOS 71 54 53  BILITOT  --   --  0.8  PROT  --   --  6.8  ALBUMIN  3.4* 3.5 3.8   Recent Labs    03/05/23 0000 10/15/23 0000 12/01/23 2236 12/01/23 2246  WBC 7.4 6.9 10.0  --   NEUTROABS 4,151.00 3,436.00 6.7  --   HGB 12.2 11.2* 13.4 13.9  HCT 37 34* 41.7 41.0  MCV  --   --  95.6  --   PLT 219 221 218  --    Lab Results  Component Value Date   TSH 1.80 12/06/2023   Lab Results  Component Value Date   HGBA1C 5.3 05/02/2013   Lab Results  Component Value Date   CHOL 188 07/10/2019   HDL 75 07/10/2019   LDLCALC  94 07/10/2019   TRIG 96 07/10/2019   CHOLHDL 2.5 07/10/2019    Significant Diagnostic Results in last 30 days:  CT Chest Wo Contrast Result Date: 01/17/2024 CLINICAL DATA:  Chronic cough. EXAM: CT CHEST WITHOUT CONTRAST TECHNIQUE: Multidetector CT imaging of the chest was performed following the standard protocol without IV contrast. RADIATION DOSE REDUCTION: This exam was performed according to the departmental dose-optimization program which includes automated exposure control, adjustment of the mA and/or kV according to patient size and/or use of iterative reconstruction technique. COMPARISON:  Remote chest CT from 2013. FINDINGS: Cardiovascular: The heart is normal in size for age. No pericardial effusion. Tortuosity and calcification of the thoracic aorta but no aneurysm. LAD calcifications are noted. Calcifications around the aortic valve are present. Mediastinum/Nodes: Small scattered mediastinal lymph nodes but no mass or overt adenopathy. The esophagus is grossly no. The thyroid  gland is unremarkable. Lungs/Pleura: Chronic basilar interstitial lung disease with AP peripheral fine reticulonodular pattern and microcalcifications. Mild upper lobe predominant bronchiectasis. Superior segment right lower lobe ill-defined sub solid nodular lesion measuring approximately 3.2 x 2.2 cm. It is possible this is an infiltrate but I would be more worried about a potential adenocarcinoma. Depending on the patient's clinical situation, a PET-CT may be helpful for further evaluation. Short-term (3 month) follow-up chest CT would be another reasonable approach in this patient given her age. Upper Abdomen: Examination limited due to breathing motion artifact. No gross abnormalities. Aortic calcifications are noted. Musculoskeletal: There are multiple subacute/healing left posterior rib fractures (5 through 9). Question recent trauma. No lytic or sclerotic lesions. Age related osteoporosis and degenerative disc disease.  IMPRESSION: 1. Chronic basilar predominant interstitial lung disease. Mild upper lobe predominant bronchiectasis. 2. Superior segment right lower lobe ill-defined sub solid nodular lesion measuring approximately 3.2 x 2.2 cm. It is possible this is an infiltrate but I would be more worried about a potential adenocarcinoma. Depending on the patient's clinical situation, a PET-CT may be helpful for further evaluation. Short-term (3 month) follow-up chest CT would be another reasonable approach in this patient, given her age. 3. No mediastinal or hilar mass or adenopathy. 4. Subacute left posterior rib fractures (5 through 9). Recommend correlation with any recent trauma. 5. Aortic atherosclerosis. Aortic Atherosclerosis (ICD10-I70.0). Electronically Signed   By: MYRTIS Stammer M.D.   On: 01/17/2024 15:08    Assessment/Plan 1. Bronchiectasis without complication (HCC) (Primary) - intermittent wheezing and subacute cough since 09/2023 - will start Trelegy  - cont duonebs - recommend Prevnar 20 due to new lung diagnosis and lives in SNF - Fluticasone-Umeclidin-Vilant (TRELEGY ELLIPTA ) 100-62.5-25 MCG/ACT AEPB; Inhale 1 puff into the lungs daily.  2. Pulmonary nodule - noted on CT chest 09/04> 3.2 x 2.2 cm - family agreeable to repeat CT chest w/o contract in 3 months - CT Chest Wo Contrast; Future  3. Cellulitis of left leg - onset 05/13 - completed doxycycline  and hydrafera blue dressing changes  - 08/29 wound culture showed many new gram negative bacilli, Ps. Aeruginosa and S. Aureus.  - completed Cipro  and Flagyl - hydrafera dressing changes restarted - improved erythema, swelling and tenderness today    Family/ staff Communication: plan discussed with patient and nurse  Labs/tests ordered:  none

## 2024-02-06 ENCOUNTER — Encounter: Payer: Self-pay | Admitting: Orthopedic Surgery

## 2024-02-06 ENCOUNTER — Non-Acute Institutional Stay (SKILLED_NURSING_FACILITY): Admitting: Orthopedic Surgery

## 2024-02-06 DIAGNOSIS — N390 Urinary tract infection, site not specified: Secondary | ICD-10-CM

## 2024-02-06 DIAGNOSIS — R911 Solitary pulmonary nodule: Secondary | ICD-10-CM

## 2024-02-06 DIAGNOSIS — J479 Bronchiectasis, uncomplicated: Secondary | ICD-10-CM

## 2024-02-06 DIAGNOSIS — I10 Essential (primary) hypertension: Secondary | ICD-10-CM | POA: Diagnosis not present

## 2024-02-06 DIAGNOSIS — S81812D Laceration without foreign body, left lower leg, subsequent encounter: Secondary | ICD-10-CM

## 2024-02-06 DIAGNOSIS — R2681 Unsteadiness on feet: Secondary | ICD-10-CM

## 2024-02-06 DIAGNOSIS — R600 Localized edema: Secondary | ICD-10-CM | POA: Diagnosis not present

## 2024-02-06 DIAGNOSIS — E66812 Obesity, class 2: Secondary | ICD-10-CM | POA: Diagnosis not present

## 2024-02-06 DIAGNOSIS — Z8673 Personal history of transient ischemic attack (TIA), and cerebral infarction without residual deficits: Secondary | ICD-10-CM | POA: Diagnosis not present

## 2024-02-06 DIAGNOSIS — Z6836 Body mass index (BMI) 36.0-36.9, adult: Secondary | ICD-10-CM

## 2024-02-06 DIAGNOSIS — R4189 Other symptoms and signs involving cognitive functions and awareness: Secondary | ICD-10-CM | POA: Diagnosis not present

## 2024-02-06 NOTE — Progress Notes (Signed)
 Location:  [facility] Room Number: 14 A [name]:  SNF [id number]) Provider:  Gil Greig BRAVO, NP    Patient Care Team: Charlanne Fredia CROME, MD as PCP - General (Internal Medicine) Court Dorn PARAS, MD as PCP - Cardiology (Cardiology) Celestia Agent, MD (Inactive) as Consulting Physician (Gastroenterology) Nicholaus Tanda CROME DOUGLAS, MD as Attending Physician (Urology) Court Dorn PARAS, MD as Consulting Physician (Cardiology) Ethyl Lonni BRAVO, MD (Inactive) as Consulting Physician (Otolaryngology) Clare Senior, DDS (Dentistry) Rosan Credit, MD as Consulting Physician (Ophthalmology) Shari Easter, MD as Consulting Physician (Orthopedic Surgery) Tanda Locus, MD as Consulting Physician (General Surgery)  Extended Emergency Contact Information Primary Emergency Contact: Eunice,Don Address: [address] [address] [place] Home Phone: [phone] Mobile Phone: [phone] Relation: Son Secondary Emergency Contact: Bertrum CHRISTELLA Gibson Address: [address]          [place], [place] [address] [place] Home Phone: [phone] Work Phone: [phone] Mobile Phone: [phone] Relation: Daughter  Code Status:  DNR Goals of care: Advanced Directive information    01/23/2024   12:01 PM  Advanced Directives  Does Patient Have a Medical Advance Directive? Yes  Type of Estate agent of [place];Living will;Out of facility DNR (pink MOST or yellow form)  Does patient want to make changes to medical advance directive? No - Patient declined  Copy of Healthcare Power of Attorney in Chart? Yes - validated most recent copy scanned in chart (See row information)     Chief Complaint  Patient presents with   Routine Visit    HPI:  Pt is a 89 y.o. female seen today for medical management of chronic diseases.    She currently resides on the skilled nursing unit at [facility].  PMH: hypertension, hyperlipidemia, macular degeneration, mild cognitive impairment, recurrent UTI, GERD, H/o TIA.    Noninfected skin tear LLE- DOI 05/13, 08/29 wound culture showed many new gram negative bacilli Ps. Aeruginosa and S. Aureus > completed Cipro  and Flagyl, remains on hydrafera blue dressing changes Bronchiectasis/Subacute cough- onset 10/12/2023, CXR unremarkable, unsuccessful trial budesonide and doxycycline , 09/04 CT chest noted chronic basilar interstitial lung disease, mild upper lobe bronchiectasis, 09/10 Trelegy started> reports improved symptoms, remains on duonebs prn Pulmonary nodule- 09/10 CT chest noted pulmonary nodule 3.2 x 2.2 cm, f/u CT chest in 3 months recommended Lower leg edema-BNP 96 12/24/2023, compression stockings daily,remains on torsemide /KCL  Cognitive impairment- MMSE 28/30 08/2022, recent BIMS 5/15 (08/19)> was 8/15 (05/23), no behaviors, not on medication HTN- BUN/creat 26/0.7 01/18/2024, remains on metoprolol  TIA- noted in 2014, not followed by neurology, remains on Plavix  Recurrent UTI- followed by urology, remains on Keflex  Unstable gait- ambulates with wheelchair Weight gain- TSH 1.80 12/06/2023, BNP 96 12/24/2022, see trends below  Recent blood pressures:  09/23- 161/93  09/16- 116/67  09/09- 145/85  Recent weights:  09/01- 187.3 lbs  08/01- 183.6 lbs  07/02- 175.1 lbs   Past Medical History:  Diagnosis Date   Cervicalgia    Chest pain at rest 01/07/2012   Displacement of cervical intervertebral disc without myelopathy    Diverticulosis of colon (without mention of hemorrhage)    Female stress incontinence    GERD (gastroesophageal reflux disease)    no peds occasional pepcid   Inguinal hernia without mention of obstruction or gangrene, unilateral or unspecified, (not specified as recurrent)    Internal hemorrhoids without mention of complication  Lumbago    Macular degeneration (senile) of retina, unspecified    Osteoarthrosis,  unspecified whether generalized or localized, unspecified site    Other and unspecified hyperlipidemia    Pain in joint, hand    Palpitations    Reflux esophagitis    Scoliosis (and kyphoscoliosis), idiopathic    Senile osteoporosis    Skin disorder    Thyroid  disease    TIA (transient ischemic attack)    Unruptured popliteal cyst 06/24/2014   Right knee    Unspecified essential hypertension    Unspecified glaucoma(365.9)    Unspecified hypothyroidism    Unspecified vitamin D  deficiency    Past Surgical History:  Procedure Laterality Date   ABDOMINAL HYSTERECTOMY  1975   Dr Sharron   APPENDECTOMY  1988   cardiolite myocardial perfusion study     DOPPLER ECHOCARDIOGRAPHY     EYE SURGERY Bilateral 2009   cataract removed right eye, Dr Carrie   FOOT SURGERY  august 2013   Hewitt, MD   INGUINAL HERNIA REPAIR Bilateral    w/mesh   INGUINAL HERNIA REPAIR Left 08/03/2017   Procedure: LAPAROSCOPIC LEFT INGUINAL HERNIA REPAIR WITH MESH;  Surgeon: Tanda Locus, MD;  Location: [facility] OR;  Service: General;  Laterality: Left;   INGUINAL HERNIA REPAIR Right 08/03/2017   Procedure: HERNIA REPAIR RIGHT INGUINAL ADULT WITH MESH;  Surgeon: Tanda Locus, MD;  Location: [facility] OR;  Service: General;  Laterality: Right;   INGUINAL HERNIA REPAIR     Dr. Tanda 04-24-18   INGUINAL HERNIA REPAIR Right 04/24/2018   Procedure: DIAGNOSTIC LAPAROSCOPIC, OPENREPAIR OF RECURRENT RIGHT INGUINAL HERNIA WITH MESH ERAS PATHWAY;  Surgeon: Tanda Locus, MD;  Location: [facility] ORS;  Service: General;  Laterality: Right;   INSERTION OF MESH Bilateral 08/03/2017   Procedure: INSERTION OF MESH;  Surgeon: Tanda Locus, MD;  Location[facility] OR;  Service: General;  Laterality: Bilateral;   NM MYOVIEW  LTD     negative   TONSILLECTOMY  1937   TOOTH EXTRACTION  09/16/13   Dr Maryjean    Allergies  Allergen Reactions   Trimethoprim Other (See Comments)    Headaches/ ear pressure    Erythromycin Other (See Comments)    UNSPECIFIED  REACTION    Macrodantin [Nitrofurantoin Macrocrystal] Other (See Comments)    UNSPECIFIED REACTION    Other     Honeydew Melon   Penicillins Other (See Comments)    UNSPECIFIED REACTION  Has patient had a PCN reaction causing immediate rash, facial/tongue/throat swelling, SOB or lightheadedness with hypotension: Unknown Has patient had a PCN reaction causing severe rash involving mucus membranes or skin necrosis: Unknown Has patient had a PCN reaction that required hospitalization: No Has patient had a PCN reaction occurring within the last 10 years: No If all of the above answers are NO, then may proceed with Cephalosporin use.   Sulfa Antibiotics Other (See Comments)    UNSPECIFIED REACTION    Latex Itching    Outpatient Encounter Medications as of 02/06/2024  Medication Sig   acetaminophen  (TYLENOL ) 500 MG tablet Take 1,000 mg by mouth 3 (three) times daily.   albuterol  (VENTOLIN  HFA) 108 (90 Base) MCG/ACT inhaler Inhale 2 puffs into the lungs every 6 (six) hours as needed for wheezing or shortness of breath.   cephALEXin  (KEFLEX ) 250 MG capsule Take 250 mg by mouth daily.   Cholecalciferol  (VITAMIN D3) 50 MCG (2000 UT) TABS Take 50 mcg by mouth daily.   clopidogrel  (PLAVIX ) 75 MG tablet TAKE ONE TABLET BY MOUTH  DAILY   Cranberry 500 MG TABS Take 1 tablet by mouth daily.   Fluticasone-Umeclidin-Vilant (TRELEGY ELLIPTA ) 100-62.5-25 MCG/ACT AEPB Inhale 1 puff into the lungs daily.   guaiFENesin -dextromethorphan (ROBITUSSIN DM) 100-10 MG/5ML syrup Take 10 mLs by mouth every 6 (six) hours as needed for cough.   ipratropium-albuterol  (DUONEB) 0.5-2.5 (3) MG/3ML SOLN Inhale 3 mLs into the lungs every 12 (twelve) hours as needed.   ipratropium-albuterol  (DUONEB) 0.5-2.5 (3) MG/3ML SOLN Take 3 mLs by nebulization 2 (two) times daily.   Menthol, Topical Analgesic, (BIOFREEZE ROLL-ON) 4 % GEL Apply 1 Application topically in the morning and at bedtime. Apply to right upper arm   Menthol,  Topical Analgesic, (BIOFREEZE ROLL-ON) 4 % GEL Apply 1 application  topically every 12 (twelve) hours as needed. Apply to right knee   metoprolol  succinate (TOPROL -XL) 25 MG 24 hr tablet Take one tablet by mouth once daily to regulate heart and control blood pressure.   Multiple Vitamins-Minerals (PRESERVISION AREDS 2 PO) Take 1 capsule by mouth 2 (two) times daily.    pantoprazole  (PROTONIX ) 20 MG tablet Take 20 mg by mouth daily.   potassium chloride  SA (KLOR-CON  M) 20 MEQ tablet Take 20 mEq by mouth daily.   torsemide  (DEMADEX ) 20 MG tablet Take 1 tablet (20 mg total) by mouth daily.   vitamin C (ASCORBIC ACID) 500 MG tablet Take 500 mg by mouth daily with supper.    Zinc Oxide 10 % OINT Apply 1 Application topically as needed.   No facility-administered encounter medications on file as of 02/06/2024.    Review of Systems  Constitutional:  Positive for unexpected weight change. Negative for appetite change.  HENT:  Positive for hearing loss. Negative for trouble swallowing.   Eyes:  Negative for visual disturbance.  Respiratory:  Positive for wheezing. Negative for cough and shortness of breath.   Cardiovascular:  Positive for leg swelling. Negative for chest pain.  Gastrointestinal:  Negative for abdominal distention and abdominal pain.  Genitourinary:  Negative for dysuria, hematuria and vaginal bleeding.  Musculoskeletal:  Positive for gait problem.  Skin:  Positive for wound.  Neurological:  Positive for weakness. Negative for dizziness and headaches.  Psychiatric/Behavioral:  Positive for confusion. Negative for dysphoric mood and sleep disturbance. The patient is not nervous/anxious.     Immunization History  Administered Date(s) Administered   Fluad Quad(high Dose 65+) 02/04/2019, 03/01/2022   INFLUENZA, HIGH DOSE SEASONAL PF 02/01/2017, 02/25/2018, 02/11/2020, 03/13/2023   Influenza Whole 02/24/2011, 02/12/2012   Influenza,inj,Quad PF,6+ Mos 02/20/2013, 02/10/2014,  02/19/2015, 02/18/2016   Influenza-Unspecified 02/16/2021   Moderna Covid-19 Fall Seasonal Vaccine 45yrs & older 02/09/2022, 03/21/2023   Moderna Sars-Covid-2 Vaccination 05/19/2019, 06/16/2019, 03/29/2020, 09/27/2020   PFIZER(Purple Top)SARS-COV-2 Vaccination 02/02/2021   Pneumococcal Conjugate-13 06/24/2014   Pneumococcal Polysaccharide-23 04/12/1998   Td 02/21/1996, 07/23/2003   Tdap 03/20/2010   Zoster Recombinant(Shingrix) 09/19/2016, 12/02/2016   Zoster, Live 09/15/2005   Pertinent  Health Maintenance Due  Topic Date Due   Influenza Vaccine  12/14/2023   DEXA SCAN  Completed   Mammogram  Discontinued      04/18/2023    4:49 PM 08/27/2023    2:51 PM 09/14/2023   12:18 PM 12/20/2023   11:31 AM 01/23/2024    1:04 PM  Fall Risk  Falls in the past year? 0 1 1 1 1   Was there an injury with Fall? 0 0 0 1 1  Fall Risk Category Calculator 0 1 1 3 2   Patient at Risk for Falls Due  to History of fall(s);Impaired balance/gait;Impaired mobility History of fall(s);Impaired mobility History of fall(s);Impaired balance/gait History of fall(s);Impaired balance/gait;Impaired mobility History of fall(s);Impaired balance/gait  Fall risk Follow up Falls evaluation completed;Education provided;Falls prevention discussed Falls evaluation completed;Education provided Falls evaluation completed;Education provided Falls evaluation completed;Education provided Falls evaluation completed   Functional Status Survey:    Vitals:   02/06/24 0918  BP: (!) 161/93  Pulse: 81  Resp: 19  Temp: 97.7 F (36.5 C)  SpO2: 97%  Weight: 189 lb 1.6 oz (85.8 kg)  Height: 5' (1.524 m)   Body mass index is 36.93 kg/m. Physical Exam Vitals reviewed.  Constitutional:      General: She is not in acute distress.    Appearance: She is obese.  HENT:     Head: Normocephalic.  Eyes:     General:        Right eye: No discharge.        Left eye: No discharge.  Cardiovascular:     Rate and Rhythm: Normal rate and  regular rhythm.     Pulses: Normal pulses.     Heart sounds: Normal heart sounds.  Pulmonary:     Effort: Pulmonary effort is normal. No respiratory distress.     Breath sounds: Wheezing present. No rhonchi or rales.     Comments: Mild exp wheezing to upper lobes Abdominal:     General: Bowel sounds are normal. There is no distension.     Palpations: Abdomen is soft.     Tenderness: There is no abdominal tenderness.  Musculoskeletal:     Cervical back: Neck supple.     Right lower leg: Edema present.     Left lower leg: Edema present.     Comments: Non pitting  Skin:    General: Skin is warm.     Capillary Refill: Capillary refill takes less than 2 seconds.  Neurological:     General: No focal deficit present.     Mental Status: She is alert and oriented to person, place, and time.     Gait: Gait abnormal.  Psychiatric:        Mood and Affect: Mood normal.     Labs reviewed: Recent Labs    12/01/23 2237 12/01/23 2246 12/06/23 0000 12/24/23 0000 01/18/24 0000  NA 139   < > 141 142 142  K 4.2   < > 3.8 4.1 3.8  CL 99  --  103 106 105  CO2 26  --  29* 29* 27*  GLUCOSE 101*  --   --   --   --   BUN 17  --  26* 23* 26*  CREATININE 0.80  --  0.7 0.7 0.7  CALCIUM  9.5  --  9.1 9.1 9.4   < > = values in this interval not displayed.   Recent Labs    03/05/23 0000 10/15/23 0000 12/01/23 2237  AST 14 13 22   ALT 16 13 17   ALKPHOS 71 54 53  BILITOT  --   --  0.8  PROT  --   --  6.8  ALBUMIN 3.4* 3.5 3.8   Recent Labs    03/05/23 0000 10/15/23 0000 12/01/23 2236 12/01/23 2246  WBC 7.4 6.9 10.0  --   NEUTROABS 4,151.00 3,436.00 6.7  --   HGB 12.2 11.2* 13.4 13.9  HCT 37 34* 41.7 41.0  MCV  --   --  95.6  --   PLT 219 221 218  --    Lab Results  Component Value Date   TSH 1.80 12/06/2023   Lab Results  Component Value Date   HGBA1C 5.3 05/02/2013   Lab Results  Component Value Date   CHOL 188 07/10/2019   HDL 75 07/10/2019   LDLCALC 94 07/10/2019    TRIG 96 07/10/2019   CHOLHDL 2.5 07/10/2019    Significant Diagnostic Results in last 30 days:  CT Chest Wo Contrast Result Date: 01/17/2024 CLINICAL DATA:  Chronic cough. EXAM: CT CHEST WITHOUT CONTRAST TECHNIQUE: Multidetector CT imaging of the chest was performed following the standard protocol without IV contrast. RADIATION DOSE REDUCTION: This exam was performed according to the departmental dose-optimization program which includes automated exposure control, adjustment of the mA and/or kV according to patient size and/or use of iterative reconstruction technique. COMPARISON:  Remote chest CT from 2013. FINDINGS: Cardiovascular: The heart is normal in size for age. No pericardial effusion. Tortuosity and calcification of the thoracic aorta but no aneurysm. LAD calcifications are noted. Calcifications around the aortic valve are present. Mediastinum/Nodes: Small scattered mediastinal lymph nodes but no mass or overt adenopathy. The esophagus is grossly no. The thyroid  gland is unremarkable. Lungs/Pleura: Chronic basilar interstitial lung disease with AP peripheral fine reticulonodular pattern and microcalcifications. Mild upper lobe predominant bronchiectasis. Superior segment right lower lobe ill-defined sub solid nodular lesion measuring approximately 3.2 x 2.2 cm. It is possible this is an infiltrate but I would be more worried about a potential adenocarcinoma. Depending on the patient's clinical situation, a PET-CT may be helpful for further evaluation. Short-term (3 month) follow-up chest CT would be another reasonable approach in this patient given her age. Upper Abdomen: Examination limited due to breathing motion artifact. No gross abnormalities. Aortic calcifications are noted. Musculoskeletal: There are multiple subacute/healing left posterior rib fractures (5 through 9). Question recent trauma. No lytic or sclerotic lesions. Age related osteoporosis and degenerative disc disease. IMPRESSION: 1.  Chronic basilar predominant interstitial lung disease. Mild upper lobe predominant bronchiectasis. 2. Superior segment right lower lobe ill-defined sub solid nodular lesion measuring approximately 3.2 x 2.2 cm. It is possible this is an infiltrate but I would be more worried about a potential adenocarcinoma. Depending on the patient's clinical situation, a PET-CT may be helpful for further evaluation. Short-term (3 month) follow-up chest CT would be another reasonable approach in this patient, given her age. 3. No mediastinal or hilar mass or adenopathy. 4. Subacute left posterior rib fractures (5 through 9). Recommend correlation with any recent trauma. 5. Aortic atherosclerosis. Aortic Atherosclerosis (ICD10-I70.0). Electronically Signed   By: MYRTIS Stammer M.D.   On: 01/17/2024 15:08    Assessment/Plan 1. Noninfected skin tear of left lower extremity, subsequent encounter (Primary) - onset 05/13 - completed doxycycline  and hydrafera blue dressing changes  - 08/29 wound culture showed many new gram negative bacilli, Ps. Aeruginosa and S. Aureus.  - completed Cipro  and Flagyl - cont hydrafera dressing changes   2. Bronchiectasis without complication (HCC) - 09/10 started on Trelegy - improved cough and wheezing   3. Pulmonary nodule -  noted on CT chest 09/04> 3.2 x 2.2 cm - family agreeable to repeat CT chest w/o contract in 3 months  4. Bilateral lower extremity edema - non pitting  - cont torsemide /KCL  5. Cognitive impairment - recent BIMS 5/15> was 8/15 - ? Poor hearing  - no behaviors  - 11/2023 CT head noted small vessel ischemia changes - not on medication   6. Essential hypertension - controlled with metoprolol   7. History of  TIA (transient ischemic attack) - cont plavix   8. Recurrent UTI - cont Keflex   9. Unstable gait - cont skilled nursing   10. Class 2 severe obesity due to excess calories with serious comorbidity and body mass index (BMI) of 36.0 to 36.9 in  adult - BMI 36.93> current weight 187 lbs> was 155.6 12/2022 - TSH 1.80 - BNP 96 - sedentary lifestyle     Family/ staff Communication: plan discussed with patient and nurse  Labs/tests ordered:  none

## 2024-02-08 DIAGNOSIS — H353211 Exudative age-related macular degeneration, right eye, with active choroidal neovascularization: Secondary | ICD-10-CM | POA: Diagnosis not present

## 2024-02-12 DIAGNOSIS — H353221 Exudative age-related macular degeneration, left eye, with active choroidal neovascularization: Secondary | ICD-10-CM | POA: Diagnosis not present

## 2024-02-13 ENCOUNTER — Non-Acute Institutional Stay (SKILLED_NURSING_FACILITY): Payer: Self-pay | Admitting: Orthopedic Surgery

## 2024-02-13 ENCOUNTER — Encounter: Payer: Self-pay | Admitting: Orthopedic Surgery

## 2024-02-13 DIAGNOSIS — S81812D Laceration without foreign body, left lower leg, subsequent encounter: Secondary | ICD-10-CM

## 2024-02-13 NOTE — Progress Notes (Signed)
 Location:  [facility] Room Number: 14/A [name]:  SNF [phone]) Provider:  Greig FORBES Cluster, NP   Charlanne Fredia CROME, MD  Patient Care Team: Charlanne Fredia CROME, MD as PCP - General (Internal Medicine) Court Dorn PARAS, MD as PCP - Cardiology (Cardiology) Celestia Agent, MD (Inactive) as Consulting Physician (Gastroenterology) Nicholaus Tanda CROME DOUGLAS, MD as Attending Physician (Urology) Court Dorn PARAS, MD as Consulting Physician (Cardiology) Ethyl Lonni FORBES, MD (Inactive) as Consulting Physician (Otolaryngology) Clare Senior, DDS (Dentistry) Rosan Credit, MD as Consulting Physician (Ophthalmology) Shari Easter, MD as Consulting Physician (Orthopedic Surgery) Tanda Locus, MD as Consulting Physician (General Surgery)  Extended Emergency Contact Information Primary Emergency Contact: Blazina,Don Address: [address] [place] [address] [place] Home Phone: [phone] Mobile Phone: [phone] Relation: Son Secondary Emergency Contact: Bertrum CHRISTELLA Gibson Address: [address]          [place], [place] [address] [place] Home Phone: [phone] Work Phone: [phone] Mobile Phone: [phone] Relation: Daughter  Code Status:  DNR Goals of care: Advanced Directive information    01/23/2024   12:01 PM  Advanced Directives  Does Patient Have a Medical Advance Directive? Yes  Type of Estate agent of [place];Living will;Out of facility DNR (pink MOST or yellow form)  Does patient want to make changes to medical advance directive? No - Patient declined  Copy of Healthcare Power of Attorney in Chart? Yes - validated most recent copy scanned in chart (See row information)     Chief Complaint  Patient presents with   Acute Visit    Tenderness to left lower leg    HPI:  Pt is a 89 y.o. female seen today for acute visit due to left lower leg tenderness.   She currently  resides on the skilled nursing unit at [facility]. PMH: hypertension, hyperlipidemia, macular degeneration, mild cognitive impairment, recurrent UTI, GERD, H/o TIA.    Noninfected skin tear LLE- DOI 05/13, 08/29 wound culture showed many new gram negative bacilli Ps. Aeruginosa and S. Aureus > completed Cipro  and Flagyl, remains on hydrafera blue dressing changes  She reports increased left lower leg tenderness x 1 day. Pain described as  sore rated 3/10. Afebrile. Vitals stable.    Past Medical History:  Diagnosis Date   Cervicalgia    Chest pain at rest 01/07/2012   Displacement of cervical intervertebral disc without myelopathy    Diverticulosis of colon (without mention of hemorrhage)    Female stress incontinence    GERD (gastroesophageal reflux disease)    no peds occasional pepcid   Inguinal hernia without mention of obstruction or gangrene, unilateral or unspecified, (not specified as recurrent)    Internal hemorrhoids without mention of complication    Lumbago    Macular degeneration (senile) of retina, unspecified    Osteoarthrosis, unspecified whether generalized or localized, unspecified site    Other and unspecified hyperlipidemia    Pain in joint, hand    Palpitations    Reflux esophagitis    Scoliosis (and kyphoscoliosis), idiopathic    Senile osteoporosis    Skin disorder    Thyroid  disease    TIA (transient ischemic attack)    Unruptured popliteal cyst 06/24/2014   Right knee    Unspecified essential hypertension    Unspecified glaucoma(365.9)    Unspecified hypothyroidism    Unspecified vitamin D  deficiency    Past Surgical History:  Procedure  Laterality Date   ABDOMINAL HYSTERECTOMY  1975   Dr Sharron   APPENDECTOMY  1988   cardiolite myocardial perfusion study     DOPPLER ECHOCARDIOGRAPHY     EYE SURGERY Bilateral 2009   cataract removed right eye, Dr Carrie   FOOT SURGERY  august 2013   Hewitt, MD   INGUINAL HERNIA REPAIR Bilateral    w/mesh    INGUINAL HERNIA REPAIR Left 08/03/2017   Procedure: LAPAROSCOPIC LEFT INGUINAL HERNIA REPAIR WITH MESH;  Surgeon: Tanda Locus, MD;  Location: [facility] OR;  Service: General;  Laterality: Left;   INGUINAL HERNIA REPAIR Right 08/03/2017   Procedure: HERNIA REPAIR RIGHT INGUINAL ADULT WITH MESH;  Surgeon: Tanda Locus, MD;  Location: [facility] OR;  Service: General;  Laterality: Right;   INGUINAL HERNIA REPAIR     Dr. Tanda 04-24-18   INGUINAL HERNIA REPAIR Right 04/24/2018   Procedure: DIAGNOSTIC LAPAROSCOPIC, OPENREPAIR OF RECURRENT RIGHT INGUINAL HERNIA WITH MESH ERAS PATHWAY;  Surgeon: Tanda Locus, MD;  Location: [facility] ORS;  Service: General;  Laterality: Right;   INSERTION OF MESH Bilateral 08/03/2017   Procedure: INSERTION OF MESH;  Surgeon: Tanda Locus, MD;  Location: [facility] OR;  Service: General;  Laterality: Bilateral;   NM MYOVIEW  LTD     negative   TONSILLECTOMY  1937   TOOTH EXTRACTION  09/16/13   Dr Maryjean    Allergies  Allergen Reactions   Trimethoprim Other (See Comments)    Headaches/ ear pressure    Erythromycin Other (See Comments)    UNSPECIFIED REACTION    Macrodantin [Nitrofurantoin Macrocrystal] Other (See Comments)    UNSPECIFIED REACTION    Other     Honeydew Melon   Penicillins Other (See Comments)    UNSPECIFIED REACTION  Has patient had a PCN reaction causing immediate rash, facial/tongue/throat swelling, SOB or lightheadedness with hypotension: Unknown Has patient had a PCN reaction causing severe rash involving mucus membranes or skin necrosis: Unknown Has patient had a PCN reaction that required hospitalization: No Has patient had a PCN reaction occurring within the last 10 years: No If all of the above answers are NO, then may proceed with Cephalosporin use.   Sulfa Antibiotics Other (See Comments)    UNSPECIFIED REACTION    Latex Itching    Outpatient Encounter Medications as of 02/13/2024  Medication Sig   acetaminophen  (TYLENOL ) 500 MG tablet Take 1,000 mg by  mouth 3 (three) times daily.   albuterol  (VENTOLIN  HFA) 108 (90 Base) MCG/ACT inhaler Inhale 2 puffs into the lungs every 6 (six) hours as needed for wheezing or shortness of breath.   cephALEXin  (KEFLEX ) 250 MG capsule Take 250 mg by mouth daily.   Cholecalciferol  (VITAMIN D3) 50 MCG (2000 UT) TABS Take 50 mcg by mouth daily.   clopidogrel  (PLAVIX ) 75 MG tablet TAKE ONE TABLET BY MOUTH DAILY   Cranberry 500 MG TABS Take 1 tablet by mouth daily.   Fluticasone-Umeclidin-Vilant (TRELEGY ELLIPTA ) 100-62.5-25 MCG/ACT AEPB Inhale 1 puff into the lungs daily.   guaiFENesin -dextromethorphan (ROBITUSSIN DM) 100-10 MG/5ML syrup Take 10 mLs by mouth every 6 (six) hours as needed for cough.   ipratropium-albuterol  (DUONEB) 0.5-2.5 (3) MG/3ML SOLN Inhale 3 mLs into the lungs every 12 (twelve) hours as needed.   ipratropium-albuterol  (DUONEB) 0.5-2.5 (3) MG/3ML SOLN Take 3 mLs by nebulization 2 (two) times daily.   Menthol, Topical Analgesic, (BIOFREEZE ROLL-ON) 4 % GEL Apply 1 Application topically in the morning and at bedtime. Apply to right upper arm   Menthol, Topical  Analgesic, (BIOFREEZE ROLL-ON) 4 % GEL Apply 1 application  topically every 12 (twelve) hours as needed. Apply to right knee   metoprolol  succinate (TOPROL -XL) 25 MG 24 hr tablet Take one tablet by mouth once daily to regulate heart and control blood pressure.   Multiple Vitamins-Minerals (PRESERVISION AREDS 2 PO) Take 1 capsule by mouth 2 (two) times daily.    pantoprazole  (PROTONIX ) 20 MG tablet Take 20 mg by mouth daily.   potassium chloride  SA (KLOR-CON  M) 20 MEQ tablet Take 20 mEq by mouth daily.   torsemide  (DEMADEX ) 20 MG tablet Take 1 tablet (20 mg total) by mouth daily.   vitamin C (ASCORBIC ACID) 500 MG tablet Take 500 mg by mouth daily with supper.    Zinc Oxide 10 % OINT Apply 1 Application topically as needed.   No facility-administered encounter medications on file as of 02/13/2024.    Review of Systems  Constitutional:  Negative.   Cardiovascular:  Positive for leg swelling. Negative for chest pain.  Skin:  Positive for wound.  Psychiatric/Behavioral:  Positive for confusion. Negative for sleep disturbance. The patient is not nervous/anxious.     Immunization History  Administered Date(s) Administered   Fluad Quad(high Dose 65+) 02/04/2019, 03/01/2022   INFLUENZA, HIGH DOSE SEASONAL PF 02/01/2017, 02/25/2018, 02/11/2020, 03/13/2023   Influenza Whole 02/24/2011, 02/12/2012   Influenza,inj,Quad PF,6+ Mos 02/20/2013, 02/10/2014, 02/19/2015, 02/18/2016   Influenza-Unspecified 02/16/2021   Moderna Covid-19 Fall Seasonal Vaccine 4yrs & older 02/09/2022, 03/21/2023   Moderna Sars-Covid-2 Vaccination 05/19/2019, 06/16/2019, 03/29/2020, 09/27/2020   PFIZER(Purple Top)SARS-COV-2 Vaccination 02/02/2021   Pneumococcal Conjugate-13 06/24/2014   Pneumococcal Polysaccharide-23 04/12/1998   Td 02/21/1996, 07/23/2003   Tdap 03/20/2010   Zoster Recombinant(Shingrix) 09/19/2016, 12/02/2016   Zoster, Live 09/15/2005   Pertinent  Health Maintenance Due  Topic Date Due   Influenza Vaccine  12/14/2023   DEXA SCAN  Completed   Mammogram  Discontinued      08/27/2023    2:51 PM 09/14/2023   12:18 PM 12/20/2023   11:31 AM 01/23/2024    1:04 PM 02/06/2024   11:08 AM  Fall Risk  Falls in the past year? 1 1 1 1    Was there an injury with Fall? 0 0 1 1 1   Fall Risk Category Calculator 1 1 3 2    Patient at Risk for Falls Due to History of fall(s);Impaired mobility History of fall(s);Impaired balance/gait History of fall(s);Impaired balance/gait;Impaired mobility History of fall(s);Impaired balance/gait History of fall(s);Impaired balance/gait  Fall risk Follow up Falls evaluation completed;Education provided Falls evaluation completed;Education provided Falls evaluation completed;Education provided Falls evaluation completed Falls evaluation completed;Education provided   Functional Status Survey:    Vitals:   02/13/24  1353  BP: (!) 146/81  Pulse: 81  Resp: 20  Temp: 97.7 F (36.5 C)  SpO2: 94%  Weight: 189 lb 9.6 oz (86 kg)  Height: 5' (1.524 m)   Body mass index is 37.03 kg/m. Physical Exam Vitals reviewed.  Constitutional:      General: She is not in acute distress.    Appearance: She is obese.  Eyes:     General:        Right eye: No discharge.        Left eye: No discharge.  Cardiovascular:     Rate and Rhythm: Normal rate and regular rhythm.     Pulses: Normal pulses.     Heart sounds: Normal heart sounds.  Pulmonary:     Effort: Pulmonary effort is normal. No respiratory distress.  Breath sounds: Wheezing present. No rhonchi or rales.  Abdominal:     Palpations: Abdomen is soft.  Musculoskeletal:     Cervical back: Neck supple.     Right lower leg: Edema present.     Left lower leg: Edema present.  Skin:    General: Skin is warm.     Capillary Refill: Capillary refill takes less than 2 seconds.     Comments: Approx 2-3 cm open wound to left shin, slough 50 % wound bed, mild tunneling, no odor/warmth, mild tenderness/swelling/erythema to surrounding skin   Neurological:     General: No focal deficit present.     Mental Status: She is alert. Mental status is at baseline.     Gait: Gait abnormal.  Psychiatric:        Mood and Affect: Mood normal.     Labs reviewed: Recent Labs    12/01/23 2237 12/01/23 2246 12/06/23 0000 12/24/23 0000 01/18/24 0000  NA 139   < > 141 142 142  K 4.2   < > 3.8 4.1 3.8  CL 99  --  103 106 105  CO2 26  --  29* 29* 27*  GLUCOSE 101*  --   --   --   --   BUN 17  --  26* 23* 26*  CREATININE 0.80  --  0.7 0.7 0.7  CALCIUM  9.5  --  9.1 9.1 9.4   < > = values in this interval not displayed.   Recent Labs    03/05/23 0000 10/15/23 0000 12/01/23 2237  AST 14 13 22   ALT 16 13 17   ALKPHOS 71 54 53  BILITOT  --   --  0.8  PROT  --   --  6.8  ALBUMIN 3.4* 3.5 3.8   Recent Labs    03/05/23 0000 10/15/23 0000 12/01/23 2236  12/01/23 2246  WBC 7.4 6.9 10.0  --   NEUTROABS 4,151.00 3,436.00 6.7  --   HGB 12.2 11.2* 13.4 13.9  HCT 37 34* 41.7 41.0  MCV  --   --  95.6  --   PLT 219 221 218  --    Lab Results  Component Value Date   TSH 1.80 12/06/2023   Lab Results  Component Value Date   HGBA1C 5.3 05/02/2013   Lab Results  Component Value Date   CHOL 188 07/10/2019   HDL 75 07/10/2019   LDLCALC 94 07/10/2019   TRIG 96 07/10/2019   CHOLHDL 2.5 07/10/2019    Significant Diagnostic Results in last 30 days:  CT Chest Wo Contrast Result Date: 01/17/2024 CLINICAL DATA:  Chronic cough. EXAM: CT CHEST WITHOUT CONTRAST TECHNIQUE: Multidetector CT imaging of the chest was performed following the standard protocol without IV contrast. RADIATION DOSE REDUCTION: This exam was performed according to the departmental dose-optimization program which includes automated exposure control, adjustment of the mA and/or kV according to patient size and/or use of iterative reconstruction technique. COMPARISON:  Remote chest CT from 2013. FINDINGS: Cardiovascular: The heart is normal in size for age. No pericardial effusion. Tortuosity and calcification of the thoracic aorta but no aneurysm. LAD calcifications are noted. Calcifications around the aortic valve are present. Mediastinum/Nodes: Small scattered mediastinal lymph nodes but no mass or overt adenopathy. The esophagus is grossly no. The thyroid  gland is unremarkable. Lungs/Pleura: Chronic basilar interstitial lung disease with AP peripheral fine reticulonodular pattern and microcalcifications. Mild upper lobe predominant bronchiectasis. Superior segment right lower lobe ill-defined sub solid nodular lesion measuring approximately  3.2 x 2.2 cm. It is possible this is an infiltrate but I would be more worried about a potential adenocarcinoma. Depending on the patient's clinical situation, a PET-CT may be helpful for further evaluation. Short-term (3 month) follow-up chest CT  would be another reasonable approach in this patient given her age. Upper Abdomen: Examination limited due to breathing motion artifact. No gross abnormalities. Aortic calcifications are noted. Musculoskeletal: There are multiple subacute/healing left posterior rib fractures (5 through 9). Question recent trauma. No lytic or sclerotic lesions. Age related osteoporosis and degenerative disc disease. IMPRESSION: 1. Chronic basilar predominant interstitial lung disease. Mild upper lobe predominant bronchiectasis. 2. Superior segment right lower lobe ill-defined sub solid nodular lesion measuring approximately 3.2 x 2.2 cm. It is possible this is an infiltrate but I would be more worried about a potential adenocarcinoma. Depending on the patient's clinical situation, a PET-CT may be helpful for further evaluation. Short-term (3 month) follow-up chest CT would be another reasonable approach in this patient, given her age. 3. No mediastinal or hilar mass or adenopathy. 4. Subacute left posterior rib fractures (5 through 9). Recommend correlation with any recent trauma. 5. Aortic atherosclerosis. Aortic Atherosclerosis (ICD10-I70.0). Electronically Signed   By: MYRTIS Stammer M.D.   On: 01/17/2024 15:08    Assessment/Plan 1. Noninfected skin tear of left lower extremity, subsequent encounter (Primary) - onset 05/13> completed doxycycline   - 08/29 wound culture showed many new gram negative bacilli, Ps. Aeruginosa and S. Aureus> completed Cipro  and Flagyl - some improvement> slough was 100% 01/11/2024> now 50%, increased tenderness/swelling/erythema today - consider xray to LLE r/o osteomyelitis if symptoms persist - consider vascular studies in future  - cont hydrafera dressing changes     Family/ staff Communication: plan discussed with patient and nurse  Labs/tests ordered:  none

## 2024-02-14 DIAGNOSIS — M24542 Contracture, left hand: Secondary | ICD-10-CM | POA: Diagnosis not present

## 2024-02-14 DIAGNOSIS — R278 Other lack of coordination: Secondary | ICD-10-CM | POA: Diagnosis not present

## 2024-02-14 DIAGNOSIS — M6281 Muscle weakness (generalized): Secondary | ICD-10-CM | POA: Diagnosis not present

## 2024-02-14 DIAGNOSIS — M25542 Pain in joints of left hand: Secondary | ICD-10-CM | POA: Diagnosis not present

## 2024-02-15 DIAGNOSIS — R278 Other lack of coordination: Secondary | ICD-10-CM | POA: Diagnosis not present

## 2024-02-15 DIAGNOSIS — M25542 Pain in joints of left hand: Secondary | ICD-10-CM | POA: Diagnosis not present

## 2024-02-15 DIAGNOSIS — M6281 Muscle weakness (generalized): Secondary | ICD-10-CM | POA: Diagnosis not present

## 2024-02-15 DIAGNOSIS — M24542 Contracture, left hand: Secondary | ICD-10-CM | POA: Diagnosis not present

## 2024-02-20 DIAGNOSIS — M24542 Contracture, left hand: Secondary | ICD-10-CM | POA: Diagnosis not present

## 2024-02-20 DIAGNOSIS — R278 Other lack of coordination: Secondary | ICD-10-CM | POA: Diagnosis not present

## 2024-02-20 DIAGNOSIS — M25542 Pain in joints of left hand: Secondary | ICD-10-CM | POA: Diagnosis not present

## 2024-02-20 DIAGNOSIS — M6281 Muscle weakness (generalized): Secondary | ICD-10-CM | POA: Diagnosis not present

## 2024-02-21 DIAGNOSIS — M6281 Muscle weakness (generalized): Secondary | ICD-10-CM | POA: Diagnosis not present

## 2024-02-21 DIAGNOSIS — M24542 Contracture, left hand: Secondary | ICD-10-CM | POA: Diagnosis not present

## 2024-02-21 DIAGNOSIS — M25542 Pain in joints of left hand: Secondary | ICD-10-CM | POA: Diagnosis not present

## 2024-02-21 DIAGNOSIS — R278 Other lack of coordination: Secondary | ICD-10-CM | POA: Diagnosis not present

## 2024-02-22 DIAGNOSIS — R278 Other lack of coordination: Secondary | ICD-10-CM | POA: Diagnosis not present

## 2024-02-22 DIAGNOSIS — M25542 Pain in joints of left hand: Secondary | ICD-10-CM | POA: Diagnosis not present

## 2024-02-22 DIAGNOSIS — M24542 Contracture, left hand: Secondary | ICD-10-CM | POA: Diagnosis not present

## 2024-02-22 DIAGNOSIS — M6281 Muscle weakness (generalized): Secondary | ICD-10-CM | POA: Diagnosis not present

## 2024-02-25 DIAGNOSIS — R278 Other lack of coordination: Secondary | ICD-10-CM | POA: Diagnosis not present

## 2024-02-25 DIAGNOSIS — M6281 Muscle weakness (generalized): Secondary | ICD-10-CM | POA: Diagnosis not present

## 2024-02-25 DIAGNOSIS — M24542 Contracture, left hand: Secondary | ICD-10-CM | POA: Diagnosis not present

## 2024-02-25 DIAGNOSIS — M25542 Pain in joints of left hand: Secondary | ICD-10-CM | POA: Diagnosis not present

## 2024-02-28 ENCOUNTER — Encounter: Payer: Self-pay | Admitting: Internal Medicine

## 2024-02-28 ENCOUNTER — Non-Acute Institutional Stay (SKILLED_NURSING_FACILITY): Payer: Self-pay | Admitting: Internal Medicine

## 2024-02-28 DIAGNOSIS — N390 Urinary tract infection, site not specified: Secondary | ICD-10-CM

## 2024-02-28 DIAGNOSIS — J479 Bronchiectasis, uncomplicated: Secondary | ICD-10-CM

## 2024-02-28 DIAGNOSIS — I10 Essential (primary) hypertension: Secondary | ICD-10-CM | POA: Diagnosis not present

## 2024-02-28 DIAGNOSIS — M720 Palmar fascial fibromatosis [Dupuytren]: Secondary | ICD-10-CM | POA: Diagnosis not present

## 2024-02-28 DIAGNOSIS — R911 Solitary pulmonary nodule: Secondary | ICD-10-CM | POA: Diagnosis not present

## 2024-02-28 DIAGNOSIS — R4189 Other symptoms and signs involving cognitive functions and awareness: Secondary | ICD-10-CM

## 2024-02-28 DIAGNOSIS — R600 Localized edema: Secondary | ICD-10-CM

## 2024-02-28 DIAGNOSIS — S81802S Unspecified open wound, left lower leg, sequela: Secondary | ICD-10-CM

## 2024-02-28 NOTE — Progress Notes (Signed)
 [facility]  [facility]:  SNF ([id number])  Provider:   Code Status: DNR Goals of Care:     01/23/2024   12:01 PM  Advanced Directives  Does Patient Have a Medical Advance Directive? Yes  Type of Estate agent of [place];Living will;Out of facility DNR (pink MOST or yellow form)  Does patient want to make changes to medical advance directive? No - Patient declined  Copy of Healthcare Power of Attorney in Chart? Yes - validated most recent copy scanned in chart (See row information)     Chief Complaint  Patient presents with   Care Management    HPI: Patient is a 89 y.o. female seen today for medical management of chronic diseases.    Lives in SNF in [facility]   Patient has a history of hypertension, hyperlipidemia, macular degeneration, mild cognitive impairment, recurrent UTI, GERD, H/o TIA   Chronic Cough CT scan shows Chronic basilar predominant interstitial lung disease. Mild upper lobe predominant bronchiectasis Also has Nodular Lesion   Started Trelegy she is also on Duo Nebs It has helped her cough.  Though she still continues to cough mostly at night. Repeat CT scan in 3 months to follow the nodule   Lower extremity edema BNP was 130 in the past she is on furosemide   Leg wound Patient had a skin tear and it has not converted into a wound.  Was seen with the nurse today  Finger contracture Patient has developed a contracture of her finger and left hand.  The therapy has been working with her.  She is wearing a brace at night.  But it is not getting better and patient is not able to do her transfers.  Her weight is stable Cognitively doing well Able to do her transfers with assist Stays in her wheelchair Past Medical History:  Diagnosis Date   Cervicalgia    Chest pain at rest 01/07/2012   Displacement of cervical intervertebral disc without myelopathy    Diverticulosis of colon (without mention of hemorrhage)    Female  stress incontinence    GERD (gastroesophageal reflux disease)    no peds occasional pepcid   Inguinal hernia without mention of obstruction or gangrene, unilateral or unspecified, (not specified as recurrent)    Internal hemorrhoids without mention of complication    Lumbago    Macular degeneration (senile) of retina, unspecified    Osteoarthrosis, unspecified whether generalized or localized, unspecified site    Other and unspecified hyperlipidemia    Pain in joint, hand    Palpitations    Reflux esophagitis    Scoliosis (and kyphoscoliosis), idiopathic    Senile osteoporosis    Skin disorder    Thyroid  disease    TIA (transient ischemic attack)    Unruptured popliteal cyst 06/24/2014   Right knee    Unspecified essential hypertension    Unspecified glaucoma(365.9)    Unspecified hypothyroidism    Unspecified vitamin D  deficiency     Past Surgical History:  Procedure Laterality Date   ABDOMINAL HYSTERECTOMY  1975   Dr Sharron   APPENDECTOMY  1988   cardiolite myocardial perfusion study     DOPPLER ECHOCARDIOGRAPHY     EYE SURGERY Bilateral 2009   cataract removed right eye, Dr Carrie   FOOT SURGERY  august 2013   Hewitt, MD   INGUINAL HERNIA REPAIR Bilateral    w/mesh   INGUINAL HERNIA REPAIR Left 08/03/2017   Procedure: LAPAROSCOPIC LEFT INGUINAL HERNIA  REPAIR WITH MESH;  Surgeon: Tanda Locus, MD;  Location: [facility] OR;  Service: General;  Laterality: Left;   INGUINAL HERNIA REPAIR Right 08/03/2017   Procedure: HERNIA REPAIR RIGHT INGUINAL ADULT WITH MESH;  Surgeon: Tanda Locus, MD;  Location: [facility] OR;  Service: General;  Laterality: Right;   INGUINAL HERNIA REPAIR     Dr. Tanda 04-24-18   INGUINAL HERNIA REPAIR Right 04/24/2018   Procedure: DIAGNOSTIC LAPAROSCOPIC, OPENREPAIR OF RECURRENT RIGHT INGUINAL HERNIA WITH MESH ERAS PATHWAY;  Surgeon: Tanda Locus, MD;  Location: [facility] ORS;  Service: General;  Laterality: Right;   INSERTION OF MESH Bilateral 08/03/2017   Procedure:  INSERTION OF MESH;  Surgeon: Tanda Locus, MD;  Location: [facility] OR;  Service: General;  Laterality: Bilateral;   NM MYOVIEW  LTD     negative   TONSILLECTOMY  1937   TOOTH EXTRACTION  09/16/13   Dr Maryjean    Allergies  Allergen Reactions   Trimethoprim Other (See Comments)    Headaches/ ear pressure    Erythromycin Other (See Comments)    UNSPECIFIED REACTION    Macrodantin [Nitrofurantoin Macrocrystal] Other (See Comments)    UNSPECIFIED REACTION    Other     Honeydew Melon   Penicillins Other (See Comments)    UNSPECIFIED REACTION  Has patient had a PCN reaction causing immediate rash, facial/tongue/throat swelling, SOB or lightheadedness with hypotension: Unknown Has patient had a PCN reaction causing severe rash involving mucus membranes or skin necrosis: Unknown Has patient had a PCN reaction that required hospitalization: No Has patient had a PCN reaction occurring within the last 10 years: No If all of the above answers are NO, then may proceed with Cephalosporin use.   Sulfa Antibiotics Other (See Comments)    UNSPECIFIED REACTION    Latex Itching    Outpatient Encounter Medications as of 02/28/2024  Medication Sig   acetaminophen  (TYLENOL ) 500 MG tablet Take 1,000 mg by mouth 3 (three) times daily.   albuterol  (VENTOLIN  HFA) 108 (90 Base) MCG/ACT inhaler Inhale 2 puffs into the lungs every 6 (six) hours as needed for wheezing or shortness of breath.   cephALEXin  (KEFLEX ) 250 MG capsule Take 250 mg by mouth daily.   Cholecalciferol  (VITAMIN D3) 50 MCG (2000 UT) TABS Take 50 mcg by mouth daily.   clopidogrel  (PLAVIX ) 75 MG tablet TAKE ONE TABLET BY MOUTH DAILY   Cranberry 500 MG TABS Take 1 tablet by mouth daily.   Fluticasone-Umeclidin-Vilant (TRELEGY ELLIPTA ) 100-62.5-25 MCG/ACT AEPB Inhale 1 puff into the lungs daily.   guaiFENesin -dextromethorphan (ROBITUSSIN DM) 100-10 MG/5ML syrup Take 10 mLs by mouth every 6 (six) hours as needed for cough.   ipratropium-albuterol   (DUONEB) 0.5-2.5 (3) MG/3ML SOLN Inhale 3 mLs into the lungs every 12 (twelve) hours as needed.   ipratropium-albuterol  (DUONEB) 0.5-2.5 (3) MG/3ML SOLN Take 3 mLs by nebulization 2 (two) times daily.   Menthol, Topical Analgesic, (BIOFREEZE ROLL-ON) 4 % GEL Apply 1 Application topically in the morning and at bedtime. Apply to right upper arm   Menthol, Topical Analgesic, (BIOFREEZE ROLL-ON) 4 % GEL Apply 1 application  topically every 12 (twelve) hours as needed. Apply to right knee   metoprolol  succinate (TOPROL -XL) 25 MG 24 hr tablet Take one tablet by mouth once daily to regulate heart and control blood pressure.   Multiple Vitamins-Minerals (PRESERVISION AREDS 2 PO) Take 1 capsule by mouth 2 (two) times daily.    pantoprazole  (PROTONIX ) 20 MG tablet Take 20 mg by mouth daily.   potassium chloride   SA (KLOR-CON  M) 20 MEQ tablet Take 20 mEq by mouth daily.   torsemide  (DEMADEX ) 20 MG tablet Take 1 tablet (20 mg total) by mouth daily.   vitamin C (ASCORBIC ACID) 500 MG tablet Take 500 mg by mouth daily with supper.    Zinc Oxide 10 % OINT Apply 1 Application topically as needed.   No facility-administered encounter medications on file as of 02/28/2024.    Review of Systems:  Review of Systems  Constitutional:  Negative for activity change and appetite change.  HENT: Negative.    Respiratory:  Positive for cough. Negative for shortness of breath.   Cardiovascular:  Positive for leg swelling.  Gastrointestinal:  Negative for constipation.  Genitourinary: Negative.   Musculoskeletal:  Positive for gait problem. Negative for arthralgias and myalgias.  Skin:  Positive for wound.  Neurological:  Negative for dizziness and weakness.  Psychiatric/Behavioral:  Positive for confusion. Negative for dysphoric mood and sleep disturbance.     Health Maintenance  Topic Date Due   Influenza Vaccine  12/14/2023   COVID-19 Vaccine (8 - 2025-26 season) 01/14/2024   DTaP/Tdap/Td (4 - Td or Tdap)  09/13/2024 (Originally 03/20/2020)   Medicare Annual Wellness (AWV)  09/13/2024   Pneumococcal Vaccine: 50+ Years  Completed   DEXA SCAN  Completed   Zoster Vaccines- Shingrix  Completed   Meningococcal B Vaccine  Aged Out   Mammogram  Discontinued    Physical Exam: Vitals:   02/28/24 1453  BP: 138/78  Pulse: 72  Resp: 18  Temp: (!) 97.4 F (36.3 C)  Weight: 188 lb (85.3 kg)   Body mass index is 36.72 kg/m. Physical Exam Vitals reviewed.  Constitutional:      Appearance: Normal appearance.  HENT:     Head: Normocephalic.     Nose: Nose normal.     Mouth/Throat:     Mouth: Mucous membranes are moist.     Pharynx: Oropharynx is clear.  Eyes:     Pupils: Pupils are equal, round, and reactive to light.  Cardiovascular:     Rate and Rhythm: Normal rate and regular rhythm.     Pulses: Normal pulses.     Heart sounds: Normal heart sounds. No murmur heard. Pulmonary:     Effort: Pulmonary effort is normal.     Breath sounds: Normal breath sounds.     Comments: Some wheezing present Abdominal:     General: Abdomen is flat. Bowel sounds are normal.     Palpations: Abdomen is soft.  Musculoskeletal:        General: Swelling present.     Cervical back: Neck supple.  Skin:    General: Skin is warm.  Neurological:     General: No focal deficit present.     Mental Status: She is alert and oriented to person, place, and time.     Comments: Contracture of Third Finger of her Left hand  Psychiatric:        Mood and Affect: Mood normal.        Thought Content: Thought content normal.     Labs reviewed: Basic Metabolic Panel: Recent Labs    12/01/23 2237 12/01/23 2246 12/06/23 0000 12/24/23 0000 01/18/24 0000  NA 139   < > 141 142 142  K 4.2   < > 3.8 4.1 3.8  CL 99  --  103 106 105  CO2 26  --  29* 29* 27*  GLUCOSE 101*  --   --   --   --  BUN 17  --  26* 23* 26*  CREATININE 0.80  --  0.7 0.7 0.7  CALCIUM  9.5  --  9.1 9.1 9.4  TSH  --   --  1.80  --   --    <  > = values in this interval not displayed.   Liver Function Tests: Recent Labs    03/05/23 0000 10/15/23 0000 12/01/23 2237  AST 14 13 22   ALT 16 13 17   ALKPHOS 71 54 53  BILITOT  --   --  0.8  PROT  --   --  6.8  ALBUMIN 3.4* 3.5 3.8   No results for input(s): LIPASE, AMYLASE in the last 8760 hours. No results for input(s): AMMONIA in the last 8760 hours. CBC: Recent Labs    03/05/23 0000 10/15/23 0000 12/01/23 2236 12/01/23 2246  WBC 7.4 6.9 10.0  --   NEUTROABS 4,151.00 3,436.00 6.7  --   HGB 12.2 11.2* 13.4 13.9  HCT 37 34* 41.7 41.0  MCV  --   --  95.6  --   PLT 219 221 218  --    Lipid Panel: No results for input(s): CHOL, HDL, LDLCALC, TRIG, CHOLHDL, LDLDIRECT in the last 8760 hours. Lab Results  Component Value Date   HGBA1C 5.3 05/02/2013    Procedures since last visit: No results found.  Assessment/Plan 1. Dupuytren contracture of left hand (Primary) Brace has failed and It is hindering her Transfers per therapy  - Ambulatory referral to Orthopedic Surgery  2. Bronchiectasis without complication (HCC) Per CT ON Trelegy which has helped a lot Also Continues on Duo Nebs  3. Pulmonary nodule Plan for Repeat CT in 3 months  4. Bilateral lower extremity edema On Toresimide  5. Cognitive impairment Does well in SNF   6. Essential hypertension Toprol   7. Recurrent UTI On Keflex  and Cranberry  8. Wound of left lower extremity, sequela Picture Attached Start on Santyl and Hydrofera blue    Labs/tests ordered:  * No order type specified * Next appt:  Visit date not found

## 2024-02-29 DIAGNOSIS — M24542 Contracture, left hand: Secondary | ICD-10-CM | POA: Diagnosis not present

## 2024-02-29 DIAGNOSIS — M25542 Pain in joints of left hand: Secondary | ICD-10-CM | POA: Diagnosis not present

## 2024-02-29 DIAGNOSIS — R278 Other lack of coordination: Secondary | ICD-10-CM | POA: Diagnosis not present

## 2024-02-29 DIAGNOSIS — M6281 Muscle weakness (generalized): Secondary | ICD-10-CM | POA: Diagnosis not present

## 2024-03-03 DIAGNOSIS — R278 Other lack of coordination: Secondary | ICD-10-CM | POA: Diagnosis not present

## 2024-03-03 DIAGNOSIS — M24542 Contracture, left hand: Secondary | ICD-10-CM | POA: Diagnosis not present

## 2024-03-03 DIAGNOSIS — M25542 Pain in joints of left hand: Secondary | ICD-10-CM | POA: Diagnosis not present

## 2024-03-03 DIAGNOSIS — M6281 Muscle weakness (generalized): Secondary | ICD-10-CM | POA: Diagnosis not present

## 2024-03-04 ENCOUNTER — Telehealth: Payer: Self-pay

## 2024-03-04 NOTE — Telephone Encounter (Signed)
 SNF resident. All calls should be directed to the facility

## 2024-03-04 NOTE — Telephone Encounter (Signed)
 I will place the order in 3 months She is not due for this right now

## 2024-03-04 NOTE — Telephone Encounter (Signed)
 Copied from CRM [id number]. Topic: Clinical - Request for Lab/Test Order >> Mar 04, 2024 11:46 AM Mercer PEDLAR wrote: Reason for CRM: Omega is calling from [facility] regarding CT Chest Wo Contrast. She is requesting for order to be resent because it is showing as cancelled in the system.    Fax: [phone]

## 2024-03-07 DIAGNOSIS — M6281 Muscle weakness (generalized): Secondary | ICD-10-CM | POA: Diagnosis not present

## 2024-03-07 DIAGNOSIS — R278 Other lack of coordination: Secondary | ICD-10-CM | POA: Diagnosis not present

## 2024-03-07 DIAGNOSIS — M24542 Contracture, left hand: Secondary | ICD-10-CM | POA: Diagnosis not present

## 2024-03-07 DIAGNOSIS — M25542 Pain in joints of left hand: Secondary | ICD-10-CM | POA: Diagnosis not present

## 2024-03-10 DIAGNOSIS — M25542 Pain in joints of left hand: Secondary | ICD-10-CM | POA: Diagnosis not present

## 2024-03-10 DIAGNOSIS — M6281 Muscle weakness (generalized): Secondary | ICD-10-CM | POA: Diagnosis not present

## 2024-03-10 DIAGNOSIS — R278 Other lack of coordination: Secondary | ICD-10-CM | POA: Diagnosis not present

## 2024-03-10 DIAGNOSIS — M24542 Contracture, left hand: Secondary | ICD-10-CM | POA: Diagnosis not present

## 2024-03-11 ENCOUNTER — Encounter: Payer: Self-pay | Admitting: Orthopedic Surgery

## 2024-03-11 DIAGNOSIS — H353221 Exudative age-related macular degeneration, left eye, with active choroidal neovascularization: Secondary | ICD-10-CM | POA: Diagnosis not present

## 2024-03-11 DIAGNOSIS — Z23 Encounter for immunization: Secondary | ICD-10-CM | POA: Diagnosis not present

## 2024-03-12 DIAGNOSIS — M25542 Pain in joints of left hand: Secondary | ICD-10-CM | POA: Diagnosis not present

## 2024-03-12 DIAGNOSIS — R278 Other lack of coordination: Secondary | ICD-10-CM | POA: Diagnosis not present

## 2024-03-12 DIAGNOSIS — M24542 Contracture, left hand: Secondary | ICD-10-CM | POA: Diagnosis not present

## 2024-03-12 DIAGNOSIS — M6281 Muscle weakness (generalized): Secondary | ICD-10-CM | POA: Diagnosis not present

## 2024-03-13 ENCOUNTER — Non-Acute Institutional Stay (SKILLED_NURSING_FACILITY): Payer: Self-pay | Admitting: Internal Medicine

## 2024-03-13 DIAGNOSIS — R600 Localized edema: Secondary | ICD-10-CM

## 2024-03-13 DIAGNOSIS — S81802A Unspecified open wound, left lower leg, initial encounter: Secondary | ICD-10-CM | POA: Diagnosis not present

## 2024-03-13 NOTE — Progress Notes (Unsigned)
 [facility]  S[facility] (31)  Provider:   Code Status: DNR Goals of Care:     01/23/2024   12:01 PM  Advanced Directives  Does Patient Have a Medical Advance Directive? Yes  Type of Estate Agent of [place];Living will;Out of facility DNR (pink MOST or yellow form)  Does patient want to make changes to medical advance directive? No - Patient declined  Copy of Healthcare Power of Attorney in Chart? Yes - validated most recent copy scanned in chart (See row information)     Chief Complaint  Patient presents with  . Acute Visit    HPI: Patient is a 89 y.o. female seen today for an acute visit for Left Leg wound and LE edema  Lives in SNF in [facility]   Patient has a history of hypertension, hyperlipidemia, macular degeneration, mild cognitive impairment, recurrent UTI, GERD, H/o TIA   Chronic Left Lower Leg wound Has had after skin tear since June 25 Her Culture grew Pseudomonas and Staph Aureus Treated with Doxycycline  and Cipro  and Flagyl in 01/2024 No Improvement Getting Santyl dressing now with Silver Alginate. Did have Hydrafera Blue dressing before   Continues to have Bilateral Edema BNP is 96 in past On Torsemide   Finger contracture Patient has developed a contracture of her finger and left hand.  The therapy has been working with her.  She is wearing a brace at night.  But it is not getting better and patient is not able to do her transfers. Has Appointment with ortho this week  Chronic Cough CT scan shows Chronic basilar predominant interstitial lung disease. Mild upper lobe predominant bronchiectasis Also has Nodular Lesion Now on Trelegy  Repeat CT pending Cough better  Cognitively doing well Able to do her transfers with assist Stays in her wheelchair Past Medical History:  Diagnosis Date  . Cervicalgia   . Chest pain at rest 01/07/2012  . Displacement of cervical intervertebral disc without myelopathy    . Diverticulosis of colon (without mention of hemorrhage)   . Female stress incontinence   . GERD (gastroesophageal reflux disease)    no peds occasional pepcid  . Inguinal hernia without mention of obstruction or gangrene, unilateral or unspecified, (not specified as recurrent)   . Internal hemorrhoids without mention of complication   . Lumbago   . Macular degeneration (senile) of retina, unspecified   . Osteoarthrosis, unspecified whether generalized or localized, unspecified site   . Other and unspecified hyperlipidemia   . Pain in joint, hand   . Palpitations   . Reflux esophagitis   . Scoliosis (and kyphoscoliosis), idiopathic   . Senile osteoporosis   . Skin disorder   . Thyroid  disease   . TIA (transient ischemic attack)   . Unruptured popliteal cyst 06/24/2014   Right knee   . Unspecified essential hypertension   . Unspecified glaucoma(365.9)   . Unspecified hypothyroidism   . Unspecified vitamin D  deficiency     Past Surgical History:  Procedure Laterality Date  . ABDOMINAL HYSTERECTOMY  1975   Dr Sharron  . APPENDECTOMY  1988  . cardiolite myocardial perfusion study    . DOPPLER ECHOCARDIOGRAPHY    . EYE SURGERY Bilateral 2009   cataract removed right eye, Dr Carrie  . FOOT SURGERY  august 2013   Kit, MD  . INGUINAL HERNIA REPAIR Bilateral    w/mesh  . INGUINAL HERNIA REPAIR Left 08/03/2017   Procedure: LAPAROSCOPIC LEFT INGUINAL HERNIA REPAIR  WITH MESH;  Surgeon: Tanda Locus, MD;  Location: [facility] OR;  Service: General;  Laterality: Left;  . INGUINAL HERNIA REPAIR Right 08/03/2017   Procedure: HERNIA REPAIR RIGHT INGUINAL ADULT WITH MESH;  Surgeon: Tanda Locus, MD;  Location: [facility] OR;  Service: General;  Laterality: Right;  . INGUINAL HERNIA REPAIR     Dr. Tanda 04-24-18  . INGUINAL HERNIA REPAIR Right 04/24/2018   Procedure: DIAGNOSTIC LAPAROSCOPIC, OPENREPAIR OF RECURRENT RIGHT INGUINAL HERNIA WITH MESH ERAS PATHWAY;  Surgeon: Tanda Locus, MD;  Location: [facility]  ORS;  Service: General;  Laterality: Right;  . INSERTION OF MESH Bilateral 08/03/2017   Procedure: INSERTION OF MESH;  Surgeon: Tanda Locus, MD;  Location: [facility] OR;  Service: General;  Laterality: Bilateral;  . NM MYOVIEW  LTD     negative  . TONSILLECTOMY  1937  . TOOTH EXTRACTION  09/16/13   Dr Gallehon    Allergies  Allergen Reactions  . Trimethoprim Other (See Comments)    Headaches/ ear pressure   . Erythromycin Other (See Comments)    UNSPECIFIED REACTION   . Macrodantin [Nitrofurantoin Macrocrystal] Other (See Comments)    UNSPECIFIED REACTION   . Other     Honeydew Melon  . Penicillins Other (See Comments)    UNSPECIFIED REACTION  Has patient had a PCN reaction causing immediate rash, facial/tongue/throat swelling, SOB or lightheadedness with hypotension: Unknown Has patient had a PCN reaction causing severe rash involving mucus membranes or skin necrosis: Unknown Has patient had a PCN reaction that required hospitalization: No Has patient had a PCN reaction occurring within the last 10 years: No If all of the above answers are NO, then may proceed with Cephalosporin use.  . Sulfa Antibiotics Other (See Comments)    UNSPECIFIED REACTION   . Latex Itching    Outpatient Encounter Medications as of 03/13/2024  Medication Sig  . acetaminophen  (TYLENOL ) 500 MG tablet Take 1,000 mg by mouth 3 (three) times daily.  . albuterol  (VENTOLIN  HFA) 108 (90 Base) MCG/ACT inhaler Inhale 2 puffs into the lungs every 6 (six) hours as needed for wheezing or shortness of breath.  . cephALEXin  (KEFLEX ) 250 MG capsule Take 250 mg by mouth daily.  . Cholecalciferol  (VITAMIN D3) 50 MCG (2000 UT) TABS Take 50 mcg by mouth daily.  . clopidogrel  (PLAVIX ) 75 MG tablet TAKE ONE TABLET BY MOUTH DAILY  . Cranberry 500 MG TABS Take 1 tablet by mouth daily.  . Fluticasone-Umeclidin-Vilant (TRELEGY ELLIPTA ) 100-62.5-25 MCG/ACT AEPB Inhale 1 puff into the lungs daily.  . guaiFENesin -dextromethorphan  (ROBITUSSIN DM) 100-10 MG/5ML syrup Take 10 mLs by mouth every 6 (six) hours as needed for cough.  SABRA ipratropium-albuterol  (DUONEB) 0.5-2.5 (3) MG/3ML SOLN Inhale 3 mLs into the lungs every 12 (twelve) hours as needed.  SABRA ipratropium-albuterol  (DUONEB) 0.5-2.5 (3) MG/3ML SOLN Take 3 mLs by nebulization 2 (two) times daily.  . Menthol, Topical Analgesic, (BIOFREEZE ROLL-ON) 4 % GEL Apply 1 Application topically in the morning and at bedtime. Apply to right upper arm  . Menthol, Topical Analgesic, (BIOFREEZE ROLL-ON) 4 % GEL Apply 1 application  topically every 12 (twelve) hours as needed. Apply to right knee  . metoprolol  succinate (TOPROL -XL) 25 MG 24 hr tablet Take one tablet by mouth once daily to regulate heart and control blood pressure.  . Multiple Vitamins-Minerals (PRESERVISION AREDS 2 PO) Take 1 capsule by mouth 2 (two) times daily.   . pantoprazole  (PROTONIX ) 20 MG tablet Take 20 mg by mouth daily.  . potassium chloride  SA (  KLOR-CON  M) 20 MEQ tablet Take 20 mEq by mouth daily.  . torsemide  (DEMADEX ) 20 MG tablet Take 1 tablet (20 mg total) by mouth daily.  . vitamin C (ASCORBIC ACID) 500 MG tablet Take 500 mg by mouth daily with supper.   . Zinc Oxide 10 % OINT Apply 1 Application topically as needed.   No facility-administered encounter medications on file as of 03/13/2024.    Review of Systems:  Review of Systems  Constitutional:  Negative for activity change and appetite change.  HENT: Negative.    Respiratory:  Negative for cough and shortness of breath.   Cardiovascular:  Positive for leg swelling.  Gastrointestinal:  Negative for constipation.  Genitourinary: Negative.   Musculoskeletal:  Positive for gait problem. Negative for arthralgias and myalgias.  Skin:  Positive for wound.  Neurological:  Negative for dizziness and weakness.  Psychiatric/Behavioral:  Negative for confusion, dysphoric mood and sleep disturbance.     Health Maintenance  Topic Date Due  . Influenza  Vaccine  12/14/2023  . COVID-19 Vaccine (8 - 2025-26 season) 01/14/2024  . DTaP/Tdap/Td (4 - Td or Tdap) 09/13/2024 (Originally 03/20/2020)  . Medicare Annual Wellness (AWV)  09/13/2024  . Pneumococcal Vaccine: 50+ Years  Completed  . DEXA SCAN  Completed  . Zoster Vaccines- Shingrix  Completed  . Meningococcal B Vaccine  Aged Out  . Mammogram  Discontinued    Physical Exam: There were no vitals filed for this visit. There is no height or weight on file to calculate BMI. Physical Exam Vitals reviewed.  Constitutional:      Appearance: Normal appearance.  HENT:     Head: Normocephalic.     Nose: Nose normal.     Mouth/Throat:     Mouth: Mucous membranes are moist.     Pharynx: Oropharynx is clear.  Eyes:     Pupils: Pupils are equal, round, and reactive to light.  Cardiovascular:     Rate and Rhythm: Normal rate and regular rhythm.     Pulses: Normal pulses.     Heart sounds: Normal heart sounds. No murmur heard. Pulmonary:     Effort: Pulmonary effort is normal.     Breath sounds: Normal breath sounds.  Abdominal:     General: Abdomen is flat. Bowel sounds are normal.     Palpations: Abdomen is soft.  Musculoskeletal:        General: Swelling present.     Cervical back: Neck supple.  Skin:    General: Skin is warm.  Neurological:     General: No focal deficit present.     Mental Status: She is alert.  Psychiatric:        Mood and Affect: Mood normal.        Thought Content: Thought content normal.     Labs reviewed: Basic Metabolic Panel: Recent Labs    12/01/23 2237 12/01/23 2246 12/06/23 0000 12/24/23 0000 01/18/24 0000  NA 139   < > 141 142 142  K 4.2   < > 3.8 4.1 3.8  CL 99  --  103 106 105  CO2 26  --  29* 29* 27*  GLUCOSE 101*  --   --   --   --   BUN 17  --  26* 23* 26*  CREATININE 0.80  --  0.7 0.7 0.7  CALCIUM  9.5  --  9.1 9.1 9.4  TSH  --   --  1.80  --   --    < > =  values in this interval not displayed.   Liver Function Tests: Recent  Labs    10/15/23 0000 12/01/23 2237  AST 13 22  ALT 13 17  ALKPHOS 54 53  BILITOT  --  0.8  PROT  --  6.8  ALBUMIN 3.5 3.8   No results for input(s): LIPASE, AMYLASE in the last 8760 hours. No results for input(s): AMMONIA in the last 8760 hours. CBC: Recent Labs    10/15/23 0000 12/01/23 2236 12/01/23 2246  WBC 6.9 10.0  --   NEUTROABS 3,436.00 6.7  --   HGB 11.2* 13.4 13.9  HCT 34* 41.7 41.0  MCV  --  95.6  --   PLT 221 218  --    Lipid Panel: No results for input(s): CHOL, HDL, LDLCALC, TRIG, CHOLHDL, LDLDIRECT in the last 8760 hours. Lab Results  Component Value Date   HGBA1C 5.3 05/02/2013    Procedures since last visit: No results found.  Assessment/Plan 1. Non-healing wound of left lower extremity (Primary)  - AMB referral to wound care center  Continue  2. Bilateral lower extremity edema Change Torsemide  to 30 mg every day  and Potassium to  Repeat BMP in 2 weeks    Labs/tests ordered:  * No order type specified * Next appt:  Visit date not found

## 2024-03-14 ENCOUNTER — Encounter: Payer: Self-pay | Admitting: Internal Medicine

## 2024-03-14 DIAGNOSIS — M25542 Pain in joints of left hand: Secondary | ICD-10-CM | POA: Diagnosis not present

## 2024-03-14 DIAGNOSIS — M6281 Muscle weakness (generalized): Secondary | ICD-10-CM | POA: Diagnosis not present

## 2024-03-14 DIAGNOSIS — R278 Other lack of coordination: Secondary | ICD-10-CM | POA: Diagnosis not present

## 2024-03-14 DIAGNOSIS — M24542 Contracture, left hand: Secondary | ICD-10-CM | POA: Diagnosis not present

## 2024-03-14 MED ORDER — TORSEMIDE 20 MG PO TABS: 30.0000 mg | ORAL_TABLET | Freq: Every day | ORAL | Status: DC

## 2024-03-17 ENCOUNTER — Encounter: Payer: Self-pay | Admitting: Radiology

## 2024-03-17 ENCOUNTER — Encounter: Payer: Self-pay | Admitting: Internal Medicine

## 2024-03-17 ENCOUNTER — Ambulatory Visit (INDEPENDENT_AMBULATORY_CARE_PROVIDER_SITE_OTHER): Admitting: Orthopedic Surgery

## 2024-03-17 DIAGNOSIS — M65332 Trigger finger, left middle finger: Secondary | ICD-10-CM | POA: Diagnosis not present

## 2024-03-17 DIAGNOSIS — M65331 Trigger finger, right middle finger: Secondary | ICD-10-CM | POA: Diagnosis not present

## 2024-03-17 MED ORDER — LIDOCAINE HCL 1 % IJ SOLN
1.0000 mL | INTRAMUSCULAR | Status: AC | PRN
  Administered 2024-03-17: 1 mL

## 2024-03-17 MED ORDER — BETAMETHASONE SOD PHOS & ACET 6 (3-3) MG/ML IJ SUSP
6.0000 mg | INTRAMUSCULAR | Status: AC | PRN
  Administered 2024-03-17: 6 mg via INTRA_ARTICULAR

## 2024-03-17 NOTE — Progress Notes (Signed)
 [address] [place] [facility] - 89 y.o. female MRN [id number]  Date of birth: [birth year]  Office Visit Note: Visit Date: 03/17/2024 PCP: Charlanne Fredia CROME, MD Referred by: Charlanne Fredia CROME, MD  Subjective: No chief complaint on file.  HPI: [name] is a pleasant 89 y.o. female who presents today for bilateral long finger trigger digits.  Left long finger has been progressive now over the past multiple months per her daughter who is with her today, has become locked in a flexed posture more recently.  Right long finger with ongoing clicking and catching which is becoming more frequent.  Has pain on both hands.  He has been seen by occupational therapy or fabricated a slight extension orthosis for the left palm.  Pertinent ROS were reviewed with the patient and found to be negative unless otherwise specified above in HPI.   Visit Reason: left long/ right long finger Duration of symptoms: 3+ months Hand dominance: right Occupation: retired Diabetic: No Smoking: No Heart/Lung History: yes Blood Thinners: plavix   Prior Testing/EMG: none Injections (Date): none Treatments: OT  Prior Surgery: none    Assessment & Plan: Visit Diagnoses:  1. Trigger finger, left middle finger   2. Trigger finger, right middle finger     Plan: Extensive discussion was had with the patient and daughter today regarding her bilateral long trigger digit.  We discussed the etiology and pathophysiology of stenosing tenosynovitis.  We discussed conservative versus surgical treatment modalities.  From a conservative standpoint, we discussed activity modification, splinting, therapy and injections.  From a surgical standpoint, we discussed the possibility for trigger digit release as well as all risk and benefits associated.  Given that she has not trialed conservative treatments outside of trigger splinting, patient is appropriate candidate for cortisone injection to the bilateral long finger A1 pulley for  symptom relief.  Risks and benefit of the cortisone injection were discussed in detail, patient agreed to proceed.  Injection was provided today without issue, patient will return in approximate 6 weeks time for a recheck.  The left long finger has significant stenosing tenosynovitis with the ongoing flexed posture, I did explain that the injection may not be able to fully alleviate this issue long-term, however the injection may help provide some pain control in the initial period.  Should symptoms continue to remain refractory conservative care, we could potentially consider doing bilateral, staged long finger trigger digit release under local anesthesia.   Follow-up: No follow-ups on file.   Meds & Orders: No orders of the defined types were placed in this encounter.  No orders of the defined types were placed in this encounter.    Procedures: Hand/UE Inj: bilateral long A1 for trigger finger on 03/17/2024 8:13 PM Indications: pain Details: 25 G needle, volar approach Medications (Right): 1 mL lidocaine  1 %; 6 mg betamethasone  acetate-betamethasone  sodium phosphate  6 (3-3) MG/ML Medications (Left): 1 mL lidocaine  1 %; 6 mg betamethasone  acetate-betamethasone  sodium phosphate  6 (3-3) MG/ML Outcome: tolerated well, no immediate complications Procedure, treatment alternatives, risks and benefits explained, specific risks discussed. Consent was given by the patient. Patient was prepped and draped in the usual sterile fashion.          Clinical History: No specialty comments available.  She reports that she has never smoked. She has never used smokeless tobacco. No results for input(s): HGBA1C, LABURIC in the last 8760 hours.  Objective:   Vital Signs: There were no vitals taken for this visit.  Physical Exam  Gen: Well-appearing, in no acute distress; non-toxic CV: Regular Rate. Well-perfused. Warm.  Resp: Breathing unlabored on room air; no wheezing. Psych: Fluid speech in  conversation; appropriate affect; normal thought process  Ortho Exam Left hand: - Palpable nodule at the A1 pulley of the long finger, associated tenderness - Fixed flexion posture at the PIP approximately 70 degrees, slightly improved passively with associated pain - Sensation intact distally, hand remains warm well-perfused  Right hand: - Palpable nodule at the A1 pulley of the long finger, associated tenderness - Notable clicking with deep flexion of the long finger, there is evidence of significant locking with deep flexion - Sensation intact distally, hand remains warm well-perfused     Imaging: No results found.  Past Medical/Family/Surgical/Social History: Medications & Allergies reviewed per EMR, new medications updated. Patient Active Problem List   Diagnosis Date Noted   Cough 10/12/2023   Sensory hearing loss, bilateral 07/18/2023   Hearing loss 12/28/2022   Dysuria 06/28/2021   Right groin hernia 03/10/2019   Macular degeneration of both eyes 03/10/2019   Senile osteoporosis 03/10/2019   Transient cerebral ischemia 03/10/2019   S/P right inguinal hernia repair 04/24/2018   Contusion, hip 03/20/2018   Overactive bladder 02/25/2018   Chronic venous insufficiency 10/15/2017   Chronic cystitis 09/19/2017   Unspecified glaucoma 09/12/2017   S/P bilateral inguinal hernia repair 08/03/2017   Advance care planning 02/01/2017   Right hip pain 08/29/2016   Pain of right sacroiliac joint 06/27/2016   Edema 03/08/2016   Right shoulder pain 03/10/2015   Corn 12/30/2014   Unruptured popliteal cyst 06/24/2014   Female stress incontinence    Inguinal hernia 12/24/2013   Balance problem 12/24/2013   Brain aneurysm 05/27/2013   TIA (transient ischemic attack) 05/02/2013   GERD (gastroesophageal reflux disease) 03/24/2013   Hypothyroidism    Vitamin D  deficiency    Macular degeneration    Unspecified glaucoma    Essential hypertension    Low back pain radiating to  left lower extremity    Hyperlipidemia    Past Medical History:  Diagnosis Date   Cervicalgia    Chest pain at rest 01/07/2012   Displacement of cervical intervertebral disc without myelopathy    Diverticulosis of colon (without mention of hemorrhage)    Female stress incontinence    GERD (gastroesophageal reflux disease)    no peds occasional pepcid   Inguinal hernia without mention of obstruction or gangrene, unilateral or unspecified, (not specified as recurrent)    Internal hemorrhoids without mention of complication    Lumbago    Macular degeneration (senile) of retina, unspecified    Osteoarthrosis, unspecified whether generalized or localized, unspecified site    Other and unspecified hyperlipidemia    Pain in joint, hand    Palpitations    Reflux esophagitis    Scoliosis (and kyphoscoliosis), idiopathic    Senile osteoporosis    Skin disorder    Thyroid  disease    TIA (transient ischemic attack)    Unruptured popliteal cyst 06/24/2014   Right knee    Unspecified essential hypertension    Unspecified glaucoma(365.9)    Unspecified hypothyroidism    Unspecified vitamin D  deficiency    Family History  Problem Relation Age of Onset   CAD Mother    Parkinson's disease Mother    Cancer Father        colon cancer   Parkinson's disease Father    Stroke Maternal Grandmother    Past Surgical History:  Procedure Laterality Date  ABDOMINAL HYSTERECTOMY  1975   Dr Sharron   APPENDECTOMY  1988   cardiolite myocardial perfusion study     DOPPLER ECHOCARDIOGRAPHY     EYE SURGERY Bilateral 2009   cataract removed right eye, Dr Carrie   FOOT SURGERY  august 2013   Hewitt, MD   INGUINAL HERNIA REPAIR Bilateral    w/mesh   INGUINAL HERNIA REPAIR Left 08/03/2017   Procedure: LAPAROSCOPIC LEFT INGUINAL HERNIA REPAIR WITH MESH;  Surgeon: Tanda Locus, MD;  Location: [facility] OR;  Service: General;  Laterality: Left;   INGUINAL HERNIA REPAIR Right 08/03/2017   Procedure: HERNIA REPAIR  RIGHT INGUINAL ADULT WITH MESH;  Surgeon: Tanda Locus, MD;  Location: [facility] OR;  Service: General;  Laterality: Right;   INGUINAL HERNIA REPAIR     Dr. Tanda 04-24-18   INGUINAL HERNIA REPAIR Right 04/24/2018   Procedure: DIAGNOSTIC LAPAROSCOPIC, OPENREPAIR OF RECURRENT RIGHT INGUINAL HERNIA WITH MESH ERAS PATHWAY;  Surgeon: Tanda Locus, MD;  Location: [facility] ORS;  Service: General;  Laterality: Right;   INSERTION OF MESH Bilateral 08/03/2017   Procedure: INSERTION OF MESH;  Surgeon: Tanda Locus, MD;  Location: [facility] OR;  Service: General;  Laterality: Bilateral;   NM MYOVIEW  LTD     negative   TONSILLECTOMY  1937   TOOTH EXTRACTION  09/16/13   Dr Maryjean   Social History   Occupational History    Comment: retired  Tobacco Use   Smoking status: Never   Smokeless tobacco: Never  Vaping Use   Vaping status: Never Used  Substance and Sexual Activity   Alcohol  use: No   Drug use: No   Sexual activity: Not Currently    Albertha Beattie Estela) Arlinda, M.D. [facility], Hand Surgery

## 2024-03-17 NOTE — Telephone Encounter (Signed)
 Orders is active from what I can see, sending to referral coordinator to further address

## 2024-03-18 DIAGNOSIS — M24542 Contracture, left hand: Secondary | ICD-10-CM | POA: Diagnosis not present

## 2024-03-18 DIAGNOSIS — R278 Other lack of coordination: Secondary | ICD-10-CM | POA: Diagnosis not present

## 2024-03-18 DIAGNOSIS — M6281 Muscle weakness (generalized): Secondary | ICD-10-CM | POA: Diagnosis not present

## 2024-03-18 DIAGNOSIS — M25542 Pain in joints of left hand: Secondary | ICD-10-CM | POA: Diagnosis not present

## 2024-03-19 ENCOUNTER — Telehealth: Payer: Self-pay | Admitting: Orthopedic Surgery

## 2024-03-19 DIAGNOSIS — R278 Other lack of coordination: Secondary | ICD-10-CM | POA: Diagnosis not present

## 2024-03-19 DIAGNOSIS — M25542 Pain in joints of left hand: Secondary | ICD-10-CM | POA: Diagnosis not present

## 2024-03-19 DIAGNOSIS — M6281 Muscle weakness (generalized): Secondary | ICD-10-CM | POA: Diagnosis not present

## 2024-03-19 DIAGNOSIS — M24542 Contracture, left hand: Secondary | ICD-10-CM | POA: Diagnosis not present

## 2024-03-19 NOTE — Telephone Encounter (Signed)
 Spoke with them; no restrictions

## 2024-03-19 NOTE — Telephone Encounter (Signed)
 [name] from [facility] called and said is PT supposed to be doing anything special for her.  CB#[phone]

## 2024-03-20 DIAGNOSIS — M25542 Pain in joints of left hand: Secondary | ICD-10-CM | POA: Diagnosis not present

## 2024-03-20 DIAGNOSIS — M24542 Contracture, left hand: Secondary | ICD-10-CM | POA: Diagnosis not present

## 2024-03-20 DIAGNOSIS — R278 Other lack of coordination: Secondary | ICD-10-CM | POA: Diagnosis not present

## 2024-03-20 DIAGNOSIS — M6281 Muscle weakness (generalized): Secondary | ICD-10-CM | POA: Diagnosis not present

## 2024-03-21 ENCOUNTER — Encounter (INDEPENDENT_AMBULATORY_CARE_PROVIDER_SITE_OTHER): Payer: Self-pay | Admitting: Otolaryngology

## 2024-03-21 ENCOUNTER — Ambulatory Visit (INDEPENDENT_AMBULATORY_CARE_PROVIDER_SITE_OTHER): Admitting: Otolaryngology

## 2024-03-21 VITALS — HR 68 | Temp 97.4°F | Ht 62.0 in | Wt 180.0 lb

## 2024-03-21 DIAGNOSIS — H903 Sensorineural hearing loss, bilateral: Secondary | ICD-10-CM

## 2024-03-21 DIAGNOSIS — H6123 Impacted cerumen, bilateral: Secondary | ICD-10-CM | POA: Diagnosis not present

## 2024-03-21 NOTE — Progress Notes (Signed)
 Patient ID: [name], female   DOB: 10-Feb-1929, 89 y.o.   MRN: [id number]  Follow up: Asymmetric hearing loss, cerumen impaction.  History of Present Illness [name] is a 89 year old female who returns today for her follow-up evaluation.  The patient was last seen in March 2025.  At that time, she was noted to have bilateral high-frequency sensorineural hearing loss, worse on the left side.  The small possibility of a retrocochlear lesion causing her asymmetric hearing loss was discussed.  The patient elected to proceed with conservative observation.  The patient returns today reporting no change in her hearing.  However, she has noted increasing clogging sensation in her ears.  She suspects her cerumen impaction has recurred. She does not currently use hearing aids and feels that her hearing is adequate for her needs.  Currently the patient denies any otalgia, otorrhea, or vertigo.  Exam: General: Communicates without difficulty, well nourished, no acute distress. Head: Normocephalic, no evidence injury, no tenderness, facial buttresses intact without stepoff. Face/sinus: No tenderness to palpation and percussion. Facial movement is normal and symmetric. Eyes: PERRL, EOMI. No scleral icterus, conjunctivae clear. Neuro: CN II exam reveals vision grossly intact.  No nystagmus at any point of gaze. Ears: Auricles well formed without lesions.  Bilateral cerumen impaction.  Nose: External evaluation reveals normal support and skin without lesions.  Dorsum is intact.  Anterior rhinoscopy reveals congested mucosa over anterior aspect of inferior turbinates and intact septum.  No purulence noted. Oral:  Oral cavity and oropharynx are intact, symmetric, without erythema or edema.  Mucosa is moist without lesions. Neck: Full range of motion without pain.  There is no significant lymphadenopathy.  No masses palpable.  Thyroid  bed within normal limits to palpation.  Parotid  glands and submandibular glands equal bilaterally without mass.  Trachea is midline.  The patient is wheelchair-bound.  Procedure: Bilateral cerumen disimpaction Anesthesia: None Description: Under the operating microscope, the cerumen is carefully removed with a combination of cerumen currette, alligator forceps, and suction catheters.  After the cerumen is removed, the TMs are noted to be normal.  No mass, erythema, or lesions. The patient tolerated the procedure well.     Assessment and Plan Assessment & Plan Bilateral impacted cerumen Significant cerumen accumulation in both ears, nearly completely occluding the ear canals. No associated pain or discomfort. Eardrums appear normal post-cleaning. - Otomicroscopy with bilateral cerumen disimpaction.  Bilateral high-frequency sensorineural hearing loss, worse on the left side - The small possibility of a retrocochlear lesion causing the asymmetric hearing loss is discussed. The patient would like to continue with conservative observation.  - Continue to monitor hearing status. - Will consider hearing aids if hearing worsens.

## 2024-03-25 DIAGNOSIS — M6281 Muscle weakness (generalized): Secondary | ICD-10-CM | POA: Diagnosis not present

## 2024-03-25 DIAGNOSIS — R278 Other lack of coordination: Secondary | ICD-10-CM | POA: Diagnosis not present

## 2024-03-25 DIAGNOSIS — M25542 Pain in joints of left hand: Secondary | ICD-10-CM | POA: Diagnosis not present

## 2024-03-25 DIAGNOSIS — M24542 Contracture, left hand: Secondary | ICD-10-CM | POA: Diagnosis not present

## 2024-03-26 DIAGNOSIS — M24542 Contracture, left hand: Secondary | ICD-10-CM | POA: Diagnosis not present

## 2024-03-26 DIAGNOSIS — M6281 Muscle weakness (generalized): Secondary | ICD-10-CM | POA: Diagnosis not present

## 2024-03-26 DIAGNOSIS — M25542 Pain in joints of left hand: Secondary | ICD-10-CM | POA: Diagnosis not present

## 2024-03-26 DIAGNOSIS — R278 Other lack of coordination: Secondary | ICD-10-CM | POA: Diagnosis not present

## 2024-03-27 DIAGNOSIS — I5089 Other heart failure: Secondary | ICD-10-CM | POA: Diagnosis not present

## 2024-03-27 DIAGNOSIS — I872 Venous insufficiency (chronic) (peripheral): Secondary | ICD-10-CM | POA: Diagnosis not present

## 2024-03-28 DIAGNOSIS — R278 Other lack of coordination: Secondary | ICD-10-CM | POA: Diagnosis not present

## 2024-03-28 DIAGNOSIS — M25542 Pain in joints of left hand: Secondary | ICD-10-CM | POA: Diagnosis not present

## 2024-03-28 DIAGNOSIS — M6281 Muscle weakness (generalized): Secondary | ICD-10-CM | POA: Diagnosis not present

## 2024-03-28 DIAGNOSIS — M24542 Contracture, left hand: Secondary | ICD-10-CM | POA: Diagnosis not present

## 2024-04-01 DIAGNOSIS — R278 Other lack of coordination: Secondary | ICD-10-CM | POA: Diagnosis not present

## 2024-04-01 DIAGNOSIS — M6281 Muscle weakness (generalized): Secondary | ICD-10-CM | POA: Diagnosis not present

## 2024-04-01 DIAGNOSIS — M24542 Contracture, left hand: Secondary | ICD-10-CM | POA: Diagnosis not present

## 2024-04-01 DIAGNOSIS — M25542 Pain in joints of left hand: Secondary | ICD-10-CM | POA: Diagnosis not present

## 2024-04-02 DIAGNOSIS — M24542 Contracture, left hand: Secondary | ICD-10-CM | POA: Diagnosis not present

## 2024-04-02 DIAGNOSIS — M6281 Muscle weakness (generalized): Secondary | ICD-10-CM | POA: Diagnosis not present

## 2024-04-02 DIAGNOSIS — R278 Other lack of coordination: Secondary | ICD-10-CM | POA: Diagnosis not present

## 2024-04-02 DIAGNOSIS — M25542 Pain in joints of left hand: Secondary | ICD-10-CM | POA: Diagnosis not present

## 2024-04-04 DIAGNOSIS — M6281 Muscle weakness (generalized): Secondary | ICD-10-CM | POA: Diagnosis not present

## 2024-04-04 DIAGNOSIS — R278 Other lack of coordination: Secondary | ICD-10-CM | POA: Diagnosis not present

## 2024-04-04 DIAGNOSIS — M25542 Pain in joints of left hand: Secondary | ICD-10-CM | POA: Diagnosis not present

## 2024-04-04 DIAGNOSIS — M24542 Contracture, left hand: Secondary | ICD-10-CM | POA: Diagnosis not present

## 2024-04-07 ENCOUNTER — Encounter (HOSPITAL_BASED_OUTPATIENT_CLINIC_OR_DEPARTMENT_OTHER): Attending: General Surgery | Admitting: General Surgery

## 2024-04-07 DIAGNOSIS — M24542 Contracture, left hand: Secondary | ICD-10-CM | POA: Diagnosis not present

## 2024-04-07 DIAGNOSIS — R278 Other lack of coordination: Secondary | ICD-10-CM | POA: Diagnosis not present

## 2024-04-07 DIAGNOSIS — M25542 Pain in joints of left hand: Secondary | ICD-10-CM | POA: Diagnosis not present

## 2024-04-07 DIAGNOSIS — I872 Venous insufficiency (chronic) (peripheral): Secondary | ICD-10-CM | POA: Diagnosis not present

## 2024-04-07 DIAGNOSIS — L97822 Non-pressure chronic ulcer of other part of left lower leg with fat layer exposed: Secondary | ICD-10-CM | POA: Diagnosis not present

## 2024-04-07 DIAGNOSIS — L97812 Non-pressure chronic ulcer of other part of right lower leg with fat layer exposed: Secondary | ICD-10-CM | POA: Insufficient documentation

## 2024-04-07 DIAGNOSIS — R600 Localized edema: Secondary | ICD-10-CM | POA: Diagnosis not present

## 2024-04-07 DIAGNOSIS — M6281 Muscle weakness (generalized): Secondary | ICD-10-CM | POA: Diagnosis not present

## 2024-04-08 DIAGNOSIS — R278 Other lack of coordination: Secondary | ICD-10-CM | POA: Diagnosis not present

## 2024-04-08 DIAGNOSIS — M24542 Contracture, left hand: Secondary | ICD-10-CM | POA: Diagnosis not present

## 2024-04-08 DIAGNOSIS — M6281 Muscle weakness (generalized): Secondary | ICD-10-CM | POA: Diagnosis not present

## 2024-04-08 DIAGNOSIS — M25542 Pain in joints of left hand: Secondary | ICD-10-CM | POA: Diagnosis not present

## 2024-04-16 ENCOUNTER — Encounter (HOSPITAL_BASED_OUTPATIENT_CLINIC_OR_DEPARTMENT_OTHER): Admitting: General Surgery

## 2024-04-16 DIAGNOSIS — L97812 Non-pressure chronic ulcer of other part of right lower leg with fat layer exposed: Secondary | ICD-10-CM | POA: Diagnosis not present

## 2024-04-16 DIAGNOSIS — I872 Venous insufficiency (chronic) (peripheral): Secondary | ICD-10-CM | POA: Diagnosis not present

## 2024-04-16 DIAGNOSIS — R600 Localized edema: Secondary | ICD-10-CM | POA: Diagnosis not present

## 2024-04-16 DIAGNOSIS — L97822 Non-pressure chronic ulcer of other part of left lower leg with fat layer exposed: Secondary | ICD-10-CM | POA: Insufficient documentation

## 2024-04-22 ENCOUNTER — Encounter: Payer: Self-pay | Admitting: Nurse Practitioner

## 2024-04-22 DIAGNOSIS — S81802A Unspecified open wound, left lower leg, initial encounter: Secondary | ICD-10-CM | POA: Insufficient documentation

## 2024-04-23 ENCOUNTER — Ambulatory Visit (HOSPITAL_COMMUNITY)
Admission: RE | Admit: 2024-04-23 | Discharge: 2024-04-23 | Attending: Orthopedic Surgery | Admitting: Orthopedic Surgery

## 2024-04-23 DIAGNOSIS — R911 Solitary pulmonary nodule: Secondary | ICD-10-CM | POA: Insufficient documentation

## 2024-04-25 DIAGNOSIS — H353221 Exudative age-related macular degeneration, left eye, with active choroidal neovascularization: Secondary | ICD-10-CM | POA: Diagnosis not present

## 2024-04-28 DIAGNOSIS — H353211 Exudative age-related macular degeneration, right eye, with active choroidal neovascularization: Secondary | ICD-10-CM | POA: Diagnosis not present

## 2024-04-29 ENCOUNTER — Encounter (HOSPITAL_BASED_OUTPATIENT_CLINIC_OR_DEPARTMENT_OTHER): Admitting: General Surgery

## 2024-04-29 DIAGNOSIS — L97822 Non-pressure chronic ulcer of other part of left lower leg with fat layer exposed: Secondary | ICD-10-CM | POA: Diagnosis not present

## 2024-04-29 DIAGNOSIS — I872 Venous insufficiency (chronic) (peripheral): Secondary | ICD-10-CM | POA: Diagnosis not present

## 2024-04-29 DIAGNOSIS — L97811 Non-pressure chronic ulcer of other part of right lower leg limited to breakdown of skin: Secondary | ICD-10-CM | POA: Diagnosis not present

## 2024-05-05 ENCOUNTER — Ambulatory Visit: Payer: Self-pay | Admitting: Internal Medicine

## 2024-05-13 ENCOUNTER — Encounter (HOSPITAL_BASED_OUTPATIENT_CLINIC_OR_DEPARTMENT_OTHER): Admitting: General Surgery

## 2024-05-14 ENCOUNTER — Encounter (HOSPITAL_BASED_OUTPATIENT_CLINIC_OR_DEPARTMENT_OTHER): Admitting: General Surgery

## 2024-05-14 DIAGNOSIS — L97822 Non-pressure chronic ulcer of other part of left lower leg with fat layer exposed: Secondary | ICD-10-CM | POA: Diagnosis not present

## 2024-05-28 ENCOUNTER — Encounter: Payer: Self-pay | Admitting: Internal Medicine

## 2024-05-29 ENCOUNTER — Non-Acute Institutional Stay (SKILLED_NURSING_FACILITY): Payer: Self-pay | Admitting: Internal Medicine

## 2024-05-29 ENCOUNTER — Encounter (HOSPITAL_BASED_OUTPATIENT_CLINIC_OR_DEPARTMENT_OTHER): Attending: General Surgery | Admitting: General Surgery

## 2024-05-29 DIAGNOSIS — R600 Localized edema: Secondary | ICD-10-CM | POA: Diagnosis not present

## 2024-05-29 DIAGNOSIS — I10 Essential (primary) hypertension: Secondary | ICD-10-CM

## 2024-05-29 DIAGNOSIS — R911 Solitary pulmonary nodule: Secondary | ICD-10-CM | POA: Diagnosis not present

## 2024-05-29 DIAGNOSIS — R0602 Shortness of breath: Secondary | ICD-10-CM | POA: Diagnosis not present

## 2024-05-29 DIAGNOSIS — S81802S Unspecified open wound, left lower leg, sequela: Secondary | ICD-10-CM

## 2024-05-29 DIAGNOSIS — R4189 Other symptoms and signs involving cognitive functions and awareness: Secondary | ICD-10-CM | POA: Diagnosis not present

## 2024-05-29 DIAGNOSIS — L97822 Non-pressure chronic ulcer of other part of left lower leg with fat layer exposed: Secondary | ICD-10-CM | POA: Diagnosis present

## 2024-05-29 DIAGNOSIS — R635 Abnormal weight gain: Secondary | ICD-10-CM | POA: Diagnosis not present

## 2024-05-29 DIAGNOSIS — N390 Urinary tract infection, site not specified: Secondary | ICD-10-CM

## 2024-05-30 ENCOUNTER — Encounter: Payer: Self-pay | Admitting: Internal Medicine

## 2024-05-30 MED ORDER — TORSEMIDE 20 MG PO TABS: 40.0000 mg | ORAL_TABLET | Freq: Every day | ORAL | Status: AC

## 2024-05-30 NOTE — Progress Notes (Signed)
 "  Location:  [facility] ([facility])  Provider:   Code Status: DNR Goals of Care:     01/23/2024   12:01 PM  Advanced Directives  Does Patient Have a Medical Advance Directive? Yes  Type of Estate Agent of [place];Living will;Out of facility DNR (pink MOST or yellow form)  Does patient want to make changes to medical advance directive? No - Patient declined  Copy of Healthcare Power of Attorney in Chart? Yes - validated most recent copy scanned in chart (See row information)     Chief Complaint  Patient presents with   Care Management    HPI: Patient is a 89 y.o. female seen today for medical management of chronic diseases.    Lives in SNF in [facility]   Patient has a history of hypertension, hyperlipidemia, macular degeneration, mild cognitive impairment, recurrent UTI, GERD, H/o TIA   Acute Issues Shortness of breath Patient was seen in her room and was noticed by the nurses that she was breathing hard They checked her pulse ox and it was 88 on room air   patient denied any shortness of breath she has been noticed to be having cough recently which was getting worse.   Also the nurses have been reported that she has gained almost 5 pounds in the past few days.    She does not have any swelling in the legs.  Denies any chest pain fever. Patient has had bronchitis in the past. Her BNP has mostly been on the lower side  Her CT scan which was done on 04/23/2024 to follow the nodule has shown pleuroparenchymal scarring and mild bronchiectasis with no pulmonary edema or pleural effusion  Patient has a lower extremity volume which is getting followed by wound care and it is doing well. Wt Readings from Last 3 Encounters:  05/29/24 189 lb (85.7 kg)  03/21/24 180 lb (81.6 kg)  03/13/24 186 lb 8 oz (84.6 kg)    Cognitively patient continues to do well Uses wheelchair. Able to do her transfers with assist Past Medical History:   Diagnosis Date   Cervicalgia    Chest pain at rest 01/07/2012   Displacement of cervical intervertebral disc without myelopathy    Diverticulosis of colon (without mention of hemorrhage)    Female stress incontinence    GERD (gastroesophageal reflux disease)    no peds occasional pepcid   Inguinal hernia without mention of obstruction or gangrene, unilateral or unspecified, (not specified as recurrent)    Internal hemorrhoids without mention of complication    Lumbago    Macular degeneration (senile) of retina, unspecified    Osteoarthrosis, unspecified whether generalized or localized, unspecified site    Other and unspecified hyperlipidemia    Pain in joint, hand    Palpitations    Reflux esophagitis    Scoliosis (and kyphoscoliosis), idiopathic    Senile osteoporosis    Skin disorder    Thyroid  disease    TIA (transient ischemic attack)    Unruptured popliteal cyst 06/24/2014   Right knee    Unspecified essential hypertension    Unspecified glaucoma(365.9)    Unspecified hypothyroidism    Unspecified vitamin D  deficiency     Past Surgical History:  Procedure Laterality Date   ABDOMINAL HYSTERECTOMY  1975   Dr Sharron   APPENDECTOMY  1988   cardiolite myocardial perfusion study     DOPPLER ECHOCARDIOGRAPHY     EYE SURGERY Bilateral 2009  cataract removed right eye, Dr Carrie   FOOT SURGERY  august 2013   Hewitt, MD   INGUINAL HERNIA REPAIR Bilateral    w/mesh   INGUINAL HERNIA REPAIR Left 08/03/2017   Procedure: LAPAROSCOPIC LEFT INGUINAL HERNIA REPAIR WITH MESH;  Surgeon: Tanda Locus, MD;  Location: [facility] OR;  Service: General;  Laterality: Left;   INGUINAL HERNIA REPAIR Right 08/03/2017   Procedure: HERNIA REPAIR RIGHT INGUINAL ADULT WITH MESH;  Surgeon: Tanda Locus, MD;  Location: [facility] OR;  Service: General;  Laterality: Right;   INGUINAL HERNIA REPAIR     Dr. Tanda 04-24-18   INGUINAL HERNIA REPAIR Right 04/24/2018   Procedure: DIAGNOSTIC LAPAROSCOPIC, OPENREPAIR OF  RECURRENT RIGHT INGUINAL HERNIA WITH MESH ERAS PATHWAY;  Surgeon: Tanda Locus, MD;  Location: [facility] ORS;  Service: General;  Laterality: Right;   INSERTION OF MESH Bilateral 08/03/2017   Procedure: INSERTION OF MESH;  Surgeon: Tanda Locus, MD;  Location: [facility] OR;  Service: General;  Laterality: Bilateral;   NM MYOVIEW  LTD     negative   TONSILLECTOMY  1937   TOOTH EXTRACTION  09/16/13   Dr Gallehon    Allergies[1]  Outpatient Encounter Medications as of 05/29/2024  Medication Sig   acetaminophen  (TYLENOL ) 500 MG tablet Take 1,000 mg by mouth 3 (three) times daily.   albuterol  (VENTOLIN  HFA) 108 (90 Base) MCG/ACT inhaler Inhale 2 puffs into the lungs every 6 (six) hours as needed for wheezing or shortness of breath.   cephALEXin  (KEFLEX ) 250 MG capsule Take 250 mg by mouth daily.   Cholecalciferol  (VITAMIN D3) 50 MCG (2000 UT) TABS Take 50 mcg by mouth daily.   clopidogrel  (PLAVIX ) 75 MG tablet TAKE ONE TABLET BY MOUTH DAILY   Cranberry 500 MG TABS Take 1 tablet by mouth daily.   Fluticasone-Umeclidin-Vilant (TRELEGY ELLIPTA ) 100-62.5-25 MCG/ACT AEPB Inhale 1 puff into the lungs daily.   guaiFENesin -dextromethorphan (ROBITUSSIN DM) 100-10 MG/5ML syrup Take 10 mLs by mouth every 6 (six) hours as needed for cough.   ipratropium-albuterol  (DUONEB) 0.5-2.5 (3) MG/3ML SOLN Inhale 3 mLs into the lungs every 12 (twelve) hours as needed.   ipratropium-albuterol  (DUONEB) 0.5-2.5 (3) MG/3ML SOLN Take 3 mLs by nebulization 2 (two) times daily.   Menthol, Topical Analgesic, (BIOFREEZE ROLL-ON) 4 % GEL Apply 1 Application topically in the morning and at bedtime. Apply to right upper arm   Menthol, Topical Analgesic, (BIOFREEZE ROLL-ON) 4 % GEL Apply 1 application  topically every 12 (twelve) hours as needed. Apply to right knee   metoprolol  succinate (TOPROL -XL) 25 MG 24 hr tablet Take one tablet by mouth once daily to regulate heart and control blood pressure.   Multiple Vitamins-Minerals (PRESERVISION AREDS  2 PO) Take 1 capsule by mouth 2 (two) times daily.    pantoprazole  (PROTONIX ) 20 MG tablet Take 20 mg by mouth daily.   potassium chloride  SA (KLOR-CON  M) 20 MEQ tablet Take 40 mEq by mouth daily.   torsemide  (DEMADEX ) 20 MG tablet Take 1.5 tablets (30 mg total) by mouth daily.   vitamin C (ASCORBIC ACID) 500 MG tablet Take 500 mg by mouth daily with supper.    Zinc Oxide 10 % OINT Apply 1 Application topically as needed.   No facility-administered encounter medications on file as of 05/29/2024.    Review of Systems:  Review of Systems  Constitutional:  Positive for unexpected weight change. Negative for activity change and appetite change.  HENT: Negative.    Respiratory:  Positive for cough and shortness of breath.  Cardiovascular:  Negative for leg swelling.  Gastrointestinal:  Negative for constipation.  Genitourinary: Negative.   Musculoskeletal:  Positive for gait problem. Negative for arthralgias and myalgias.  Skin:  Positive for wound.  Neurological:  Negative for dizziness and weakness.  Psychiatric/Behavioral:  Negative for confusion, dysphoric mood and sleep disturbance.     Health Maintenance  Topic Date Due   COVID-19 Vaccine (8 - 2025-26 season) 01/14/2024   DTaP/Tdap/Td (4 - Td or Tdap) 09/13/2024 (Originally 03/20/2020)   Medicare Annual Wellness (AWV)  09/13/2024   Pneumococcal Vaccine: 50+ Years  Completed   Influenza Vaccine  Completed   Bone Density Scan  Completed   Zoster Vaccines- Shingrix  Completed   Meningococcal B Vaccine  Aged Out   Mammogram  Discontinued    Physical Exam: There were no vitals filed for this visit. There is no height or weight on file to calculate BMI. Physical Exam Vitals reviewed.  Constitutional:      Appearance: Normal appearance.  HENT:     Head: Normocephalic.     Nose: Nose normal.     Mouth/Throat:     Mouth: Mucous membranes are moist.     Pharynx: Oropharynx is clear.  Eyes:     Pupils: Pupils are equal, round,  and reactive to light.  Cardiovascular:     Rate and Rhythm: Normal rate and regular rhythm.     Pulses: Normal pulses.     Heart sounds: Normal heart sounds. No murmur heard. Pulmonary:     Effort: Pulmonary effort is normal.     Breath sounds: Normal breath sounds.     Comments: Patient had decreased breath sounds bilateral with fine crackles.  No wheezing noticed Abdominal:     General: Abdomen is flat. Bowel sounds are normal.     Palpations: Abdomen is soft.  Musculoskeletal:        General: No swelling.     Cervical back: Neck supple.  Skin:    General: Skin is warm.  Neurological:     General: No focal deficit present.     Mental Status: She is alert and oriented to person, place, and time.  Psychiatric:        Mood and Affect: Mood normal.        Thought Content: Thought content normal.     Labs reviewed: Basic Metabolic Panel: Recent Labs    12/01/23 2237 12/01/23 2246 12/06/23 0000 12/24/23 0000 01/18/24 0000  NA 139   < > 141 142 142  K 4.2   < > 3.8 4.1 3.8  CL 99  --  103 106 105  CO2 26  --  29* 29* 27*  GLUCOSE 101*  --   --   --   --   BUN 17  --  26* 23* 26*  CREATININE 0.80  --  0.7 0.7 0.7  CALCIUM  9.5  --  9.1 9.1 9.4  TSH  --   --  1.80  --   --    < > = values in this interval not displayed.   Liver Function Tests: Recent Labs    10/15/23 0000 12/01/23 2237  AST 13 22  ALT 13 17  ALKPHOS 54 53  BILITOT  --  0.8  PROT  --  6.8  ALBUMIN 3.5 3.8   No results for input(s): LIPASE, AMYLASE in the last 8760 hours. No results for input(s): AMMONIA in the last 8760 hours. CBC: Recent Labs    10/15/23 0000 12/01/23  2236 12/01/23 2246  WBC 6.9 10.0  --   NEUTROABS 3,436.00 6.7  --   HGB 11.2* 13.4 13.9  HCT 34* 41.7 41.0  MCV  --  95.6  --   PLT 221 218  --    Lipid Panel: No results for input(s): CHOL, HDL, LDLCALC, TRIG, CHOLHDL, LDLDIRECT in the last 8760 hours. Lab Results  Component Value Date   HGBA1C 5.3  05/02/2013    Procedures since last visit: No results found.  Assessment/Plan 1. SOB (shortness of breath) (Primary) Patient does have bronchiectasis/mild ILD on her CT scan. Her shortness of breath and cough can be multifactorial. Will start her on prednisone 40 mg for 5 days Continue Trelegy Patient is already on DuoNebs I also ordered a chest x-ray.  2. Weight gain No her BNP has been in normal limits in the past I will give her extra dose of torsemide  today to see if that helps her shortness of breath and cough Also changed her dose to 40 mg every day Repeat BMP in 1 week  3. Pulmonary nodule Follow-up CT in 6 months  4. Cognitive impairment Continue to to do well in SNF  5. Essential hypertension Toprol   6. Recurrent UTI On Keflex  per urology  7. Wound of left lower extremity, sequela Is doing well follows with wound care 8 GERD On Protonix . 9 H/o TIA On plavix   Labs/tests ordered:  BMP in 1 week Next appt:  Visit date not found        [1]  Allergies Allergen Reactions   Trimethoprim Other (See Comments)    Headaches/ ear pressure    Erythromycin Other (See Comments)    UNSPECIFIED REACTION    Macrodantin [Nitrofurantoin Macrocrystal] Other (See Comments)    UNSPECIFIED REACTION    Other     Honeydew Melon   Penicillins Other (See Comments)    UNSPECIFIED REACTION  Has patient had a PCN reaction causing immediate rash, facial/tongue/throat swelling, SOB or lightheadedness with hypotension: Unknown Has patient had a PCN reaction causing severe rash involving mucus membranes or skin necrosis: Unknown Has patient had a PCN reaction that required hospitalization: No Has patient had a PCN reaction occurring within the last 10 years: No If all of the above answers are NO, then may proceed with Cephalosporin use.   Sulfa Antibiotics Other (See Comments)    UNSPECIFIED REACTION    Latex Itching   "

## 2024-06-02 ENCOUNTER — Encounter: Payer: Self-pay | Admitting: Nurse Practitioner

## 2024-06-02 ENCOUNTER — Non-Acute Institutional Stay (SKILLED_NURSING_FACILITY): Payer: Self-pay | Admitting: Nurse Practitioner

## 2024-06-02 DIAGNOSIS — J189 Pneumonia, unspecified organism: Secondary | ICD-10-CM

## 2024-06-02 DIAGNOSIS — N3020 Other chronic cystitis without hematuria: Secondary | ICD-10-CM

## 2024-06-02 DIAGNOSIS — G459 Transient cerebral ischemic attack, unspecified: Secondary | ICD-10-CM

## 2024-06-02 DIAGNOSIS — R609 Edema, unspecified: Secondary | ICD-10-CM

## 2024-06-02 DIAGNOSIS — J849 Interstitial pulmonary disease, unspecified: Secondary | ICD-10-CM | POA: Insufficient documentation

## 2024-06-02 DIAGNOSIS — I10 Essential (primary) hypertension: Secondary | ICD-10-CM

## 2024-06-02 NOTE — Assessment & Plan Note (Signed)
 SOB, on 5 day course Prednisone since 05/29/24  CXR 05/30/24 bilateral opacities, pulmonary edema vs atelectasis vs PNA  05/30/24 placed on Levaquin  500mg  x 7 days, adding Furosemide  20mg  every day x 3 days, BMP today.

## 2024-06-02 NOTE — Assessment & Plan Note (Signed)
 on Keflex  250mg  every day

## 2024-06-02 NOTE — Assessment & Plan Note (Addendum)
 SOB, on 5 day course Prednisone since 05/29/24  CXR 05/30/24 bilateral opacities, pulmonary edema vs atelectasis vs PNA  05/30/24 placed on Levaquin  500mg  x 7 days, adding Furosemide  20mg  every day x 3 days, BMP today.   Chronic venous insufficiency/Edema BLE, BNP wnl in the past, increased Torsemide  to 40mg  every day 05/29/24 in setting of SOB

## 2024-06-02 NOTE — Assessment & Plan Note (Signed)
 Hx of TIA, on Plavix 

## 2024-06-02 NOTE — Assessment & Plan Note (Signed)
 SOB, on 5 day course Prednisone since 05/29/24  CXR 05/30/24 bilateral opacities, pulmonary edema vs atelectasis vs PNA  05/30/24 placed on Levaquin  500mg  x 7 days, adding Furosemide  20mg  every day x 3 days, BMP today.   Hx of mild ILD/bronchiectasis CT scan, on Trelegy, DuoNeb

## 2024-06-02 NOTE — Assessment & Plan Note (Signed)
 on Metoprolol , Bun/creat 26/0.7 01/18/24

## 2024-06-02 NOTE — Progress Notes (Unsigned)
 " Location:   SNF [facility] Room Number: 14 [name]:  SNF (31) Provider: Larwance Mckynna Vanloan NP  Charlanne Fredia CROME, MD  Patient Care Team: Charlanne Fredia CROME, MD as PCP - General (Internal Medicine) Court Dorn PARAS, MD as PCP - Cardiology (Cardiology) Celestia Agent, MD (Inactive) as Consulting Physician (Gastroenterology) Nicholaus Tanda CROME DOUGLAS, MD as Attending Physician (Urology) Court Dorn PARAS, MD as Consulting Physician (Cardiology) Ethyl Lonni BRAVO, MD (Inactive) as Consulting Physician (Otolaryngology) Clare Senior, DDS (Dentistry) Rosan Credit, MD as Consulting Physician (Ophthalmology) Shari Easter, MD as Consulting Physician (Orthopedic Surgery) Tanda Locus, MD as Consulting Physician (General Surgery)  Extended Emergency Contact Information Primary Emergency Contact: Bertrum CHRISTELLA Gibson Address: [address]          [place], [place] [address] [place] Home Phone: [phone] Work Phone: [phone] Mobile Phone: [phone] Relation: Daughter Secondary Emergency Contact: Bertrum Piles Address: [address]          [place] [address] [place] Home Phone: [phone] Mobile Phone: [phone] Relation: Son  Code Status:  DNR Goals of care: Advanced Directive information    01/23/2024   12:01 PM  Advanced Directives  Does Patient Have a Medical Advance Directive? Yes  Type of Estate Agent of [place];Living will;Out of facility DNR (pink MOST or yellow form)  Does patient want to make changes to medical advance directive? No - Patient declined  Copy of Healthcare Power of Attorney in Chart? Yes - validated most recent copy scanned in chart (See row information)     No chief complaint on file.   HPI:  Pt is a 89 y.o. female seen today for an acute visit for SOB, on 5 day course Prednisone since 05/29/24  CXR 05/30/24 bilateral opacities, pulmonary edema vs atelectasis vs  PNA  05/30/24 placed on Levaquin  500mg  x 7 days, adding Furosemide  20mg  every day x 3 days, BMP today.   Hx of mild ILD/bronchiectasis CT scan, on Trelegy, DuoNeb   HTN, on Metoprolol , Bun/creat 26/0.7 01/18/24             Chronic venous insufficiency/Edema BLE, BNP wnl in the past, increased Torsemide  to 40mg  every day 05/29/24 in setting of SOB             Chronic cystitis, on Keflex  250mg  every day  Cognitive impairment, SNF for care needs  Hx of TIA, on Plavix    Wound LLE, followed by wound care  Past Medical History:  Diagnosis Date   Cervicalgia    Chest pain at rest 01/07/2012   Displacement of cervical intervertebral disc without myelopathy    Diverticulosis of colon (without mention of hemorrhage)    Female stress incontinence    GERD (gastroesophageal reflux disease)    no peds occasional pepcid   Inguinal hernia without mention of obstruction or gangrene, unilateral or unspecified, (not specified as recurrent)    Internal hemorrhoids without mention of complication    Lumbago    Macular degeneration (senile) of retina, unspecified    Osteoarthrosis, unspecified whether generalized or localized, unspecified site    Other and unspecified hyperlipidemia    Pain in joint, hand    Palpitations    Reflux esophagitis    Scoliosis (and kyphoscoliosis), idiopathic    Senile osteoporosis    Skin disorder    Thyroid  disease    TIA (transient ischemic attack)    Unruptured popliteal cyst 06/24/2014   Right knee    Unspecified essential hypertension  Unspecified glaucoma(365.9)    Unspecified hypothyroidism    Unspecified vitamin D  deficiency    Past Surgical History:  Procedure Laterality Date   ABDOMINAL HYSTERECTOMY  1975   Dr Sharron   APPENDECTOMY  1988   cardiolite myocardial perfusion study     DOPPLER ECHOCARDIOGRAPHY     EYE SURGERY Bilateral 2009   cataract removed right eye, Dr Carrie   FOOT SURGERY  august 2013   Hewitt, MD   INGUINAL HERNIA REPAIR Bilateral     w/mesh   INGUINAL HERNIA REPAIR Left 08/03/2017   Procedure: LAPAROSCOPIC LEFT INGUINAL HERNIA REPAIR WITH MESH;  Surgeon: Tanda Locus, MD;  Location: [facility] OR;  Service: General;  Laterality: Left;   INGUINAL HERNIA REPAIR Right 08/03/2017   Procedure: HERNIA REPAIR RIGHT INGUINAL ADULT WITH MESH;  Surgeon: Tanda Locus, MD;  Location: [facility] OR;  Service: General;  Laterality: Right;   INGUINAL HERNIA REPAIR     Dr. Tanda 04-24-18   INGUINAL HERNIA REPAIR Right 04/24/2018   Procedure: DIAGNOSTIC LAPAROSCOPIC, OPENREPAIR OF RECURRENT RIGHT INGUINAL HERNIA WITH MESH ERAS PATHWAY;  Surgeon: Tanda Locus, MD;  Location: [facility] ORS;  Service: General;  Laterality: Right;   INSERTION OF MESH Bilateral 08/03/2017   Procedure: INSERTION OF MESH;  Surgeon: Tanda Locus, MD;  Location[facility] OR;  Service: General;  Laterality: Bilateral;   NM MYOVIEW  LTD     negative   TONSILLECTOMY  1937   TOOTH EXTRACTION  09/16/13   Dr Maryjean    Allergies[1]  Allergies as of 06/02/2024       Reactions   Trimethoprim Other (See Comments)   Headaches/ ear pressure    Erythromycin Other (See Comments)   UNSPECIFIED REACTION    Macrodantin [nitrofurantoin Macrocrystal] Other (See Comments)   UNSPECIFIED REACTION    Other    Honeydew Melon   Penicillins Other (See Comments)   UNSPECIFIED REACTION  Has patient had a PCN reaction causing immediate rash, facial/tongue/throat swelling, SOB or lightheadedness with hypotension: Unknown Has patient had a PCN reaction causing severe rash involving mucus membranes or skin necrosis: Unknown Has patient had a PCN reaction that required hospitalization: No Has patient had a PCN reaction occurring within the last 10 years: No If all of the above answers are NO, then may proceed with Cephalosporin use.   Sulfa Antibiotics Other (See Comments)   UNSPECIFIED REACTION    Latex Itching        Medication List        Accurate as of June 02, 2024 11:59 PM. If you have any  questions, ask your nurse or doctor.          acetaminophen  500 MG tablet Commonly known as: TYLENOL  Take 1,000 mg by mouth 3 (three) times daily.   albuterol  108 (90 Base) MCG/ACT inhaler Commonly known as: VENTOLIN  HFA Inhale 2 puffs into the lungs every 6 (six) hours as needed for wheezing or shortness of breath.   ascorbic acid 500 MG tablet Commonly known as: VITAMIN C Take 500 mg by mouth daily with supper.   Biofreeze Roll-On 4 % Gel Generic drug: Menthol (Topical Analgesic) Apply 1 Application topically in the morning and at bedtime. Apply to right upper arm   Biofreeze Roll-On 4 % Gel Generic drug: Menthol (Topical Analgesic) Apply 1 application  topically every 12 (twelve) hours as needed. Apply to right knee   cephALEXin  250 MG capsule Commonly known as: KEFLEX  Take 250 mg by mouth daily.   clopidogrel  75 MG tablet  Commonly known as: PLAVIX  TAKE ONE TABLET BY MOUTH DAILY   Cranberry 500 MG Tabs Take 1 tablet by mouth daily.   guaiFENesin -dextromethorphan 100-10 MG/5ML syrup Commonly known as: ROBITUSSIN DM Take 10 mLs by mouth every 6 (six) hours as needed for cough.   ipratropium-albuterol  0.5-2.5 (3) MG/3ML Soln Commonly known as: DUONEB Inhale 3 mLs into the lungs every 12 (twelve) hours as needed.   ipratropium-albuterol  0.5-2.5 (3) MG/3ML Soln Commonly known as: DUONEB Take 3 mLs by nebulization 2 (two) times daily.   metoprolol  succinate 25 MG 24 hr tablet Commonly known as: TOPROL -XL Take one tablet by mouth once daily to regulate heart and control blood pressure.   pantoprazole  20 MG tablet Commonly known as: PROTONIX  Take 20 mg by mouth daily.   potassium chloride  SA 20 MEQ tablet Commonly known as: KLOR-CON  M Take 40 mEq by mouth daily.   PRESERVISION AREDS 2 PO Take 1 capsule by mouth 2 (two) times daily.   torsemide  20 MG tablet Commonly known as: DEMADEX  Take 2 tablets (40 mg total) by mouth daily.   Trelegy Ellipta   100-62.5-25 MCG/ACT Aepb Generic drug: Fluticasone-Umeclidin-Vilant Inhale 1 puff into the lungs daily.   Vitamin D3 50 MCG (2000 UT) Tabs Take 50 mcg by mouth daily.   Zinc Oxide 10 % Oint Apply 1 Application topically as needed.        Review of Systems  Constitutional:  Negative for appetite change, fatigue and fever.  HENT:  Positive for hearing loss. Negative for congestion and voice change.   Eyes:  Positive for visual disturbance.  Respiratory:  Positive for cough. Negative for chest tightness, shortness of breath and wheezing.   Cardiovascular:  Positive for leg swelling. Negative for chest pain and palpitations.  Gastrointestinal:  Negative for abdominal pain and constipation.  Genitourinary:  Negative for difficulty urinating, dysuria and urgency.  Musculoskeletal:  Positive for arthralgias and gait problem.       Lower back, left hip/leg pain  Skin:        Wound LLE  Neurological:  Negative for speech difficulty and headaches.  Psychiatric/Behavioral:  Negative for behavioral problems and sleep disturbance. The patient is not nervous/anxious.     Immunization History  Administered Date(s) Administered   Fluad Quad(high Dose 65+) 02/04/2019, 03/01/2022   INFLUENZA, HIGH DOSE SEASONAL PF 02/01/2017, 02/25/2018, 02/11/2020, 03/13/2023   Influenza Whole 02/24/2011, 02/12/2012   Influenza,inj,Quad PF,6+ Mos 02/20/2013, 02/10/2014, 02/19/2015, 02/18/2016   Influenza-Unspecified 02/16/2021   Moderna Covid-19 Fall Seasonal Vaccine 46yrs & older 02/09/2022, 03/21/2023   Moderna Sars-Covid-2 Vaccination 05/19/2019, 06/16/2019, 03/29/2020, 09/27/2020   PFIZER(Purple Top)SARS-COV-2 Vaccination 02/02/2021   Pneumococcal Conjugate-13 06/24/2014   Pneumococcal Polysaccharide-23 04/12/1998   Td 02/21/1996, 07/23/2003   Tdap 03/20/2010   Zoster Recombinant(Shingrix) 09/19/2016, 12/02/2016   Zoster, Live 09/15/2005   Pertinent  Health Maintenance Due  Topic Date Due    Influenza Vaccine  Completed   Bone Density Scan  Completed   Mammogram  Discontinued      09/14/2023   12:18 PM 12/20/2023   11:31 AM 01/23/2024    1:04 PM 02/06/2024   11:08 AM 02/13/2024    2:05 PM  Fall Risk  Falls in the past year? 1 1 1  1   Was there an injury with Fall? 0  1  1  1  1    Fall Risk Category Calculator 1 3 2  2   Patient at Risk for Falls Due to History of fall(s);Impaired balance/gait History of fall(s);Impaired balance/gait;Impaired mobility  History of fall(s);Impaired balance/gait History of fall(s);Impaired balance/gait History of fall(s);Impaired balance/gait  Fall risk Follow up Falls evaluation completed;Education provided Falls evaluation completed;Education provided Falls evaluation completed Falls evaluation completed;Education provided Falls evaluation completed     Data saved with a previous flowsheet row definition   Functional Status Survey:    Vitals:   06/02/24 1207  BP: 114/70  Pulse: 71  Resp: 16  Temp: (!) 97 F (36.1 C)  SpO2: 94%  Weight: 186 lb 3.2 oz (84.5 kg)   Body mass index is 34.06 kg/m. Physical Exam Vitals and nursing note reviewed.  Constitutional:      Appearance: Normal appearance.  HENT:     Head: Normocephalic and atraumatic.     Nose: Nose normal.     Mouth/Throat:     Mouth: Mucous membranes are moist.  Eyes:     Extraocular Movements: Extraocular movements intact.     Conjunctiva/sclera: Conjunctivae normal.     Pupils: Pupils are equal, round, and reactive to light.  Cardiovascular:     Rate and Rhythm: Normal rate and regular rhythm.     Heart sounds: Murmur heard.     Comments: DP pulses presents R+L Pulmonary:     Effort: Pulmonary effort is normal.     Breath sounds: Wheezing and rales present. No rhonchi.     Comments: Posterior lower lung fields rales.  Abdominal:     General: Bowel sounds are normal.     Palpations: Abdomen is soft.     Tenderness: There is no abdominal tenderness.     Hernia: A  hernia is present.     Comments: Right inguinal hernia  Musculoskeletal:     Cervical back: Normal range of motion and neck supple.     Right lower leg: Edema present.     Left lower leg: Edema present.     Comments: mild edema BLE  Skin:    General: Skin is warm and dry.     Comments: LLE wound is covered in clean dressing  Neurological:     General: No focal deficit present.     Mental Status: She is alert. Mental status is at baseline.     Gait: Gait abnormal.     Comments: Oriented to person, place.   Psychiatric:        Mood and Affect: Mood normal.        Behavior: Behavior normal.        Thought Content: Thought content normal.     Labs reviewed: Recent Labs    12/01/23 2237 12/01/23 2246 12/06/23 0000 12/24/23 0000 01/18/24 0000  NA 139   < > 141 142 142  K 4.2   < > 3.8 4.1 3.8  CL 99  --  103 106 105  CO2 26  --  29* 29* 27*  GLUCOSE 101*  --   --   --   --   BUN 17  --  26* 23* 26*  CREATININE 0.80  --  0.7 0.7 0.7  CALCIUM  9.5  --  9.1 9.1 9.4   < > = values in this interval not displayed.   Recent Labs    10/15/23 0000 12/01/23 2237  AST 13 22  ALT 13 17  ALKPHOS 54 53  BILITOT  --  0.8  PROT  --  6.8  ALBUMIN 3.5 3.8   Recent Labs    10/15/23 0000 12/01/23 2236 12/01/23 2246  WBC 6.9 10.0  --   NEUTROABS 3,436.00  6.7  --   HGB 11.2* 13.4 13.9  HCT 34* 41.7 41.0  MCV  --  95.6  --   PLT 221 218  --    Lab Results  Component Value Date   TSH 1.80 12/06/2023   Lab Results  Component Value Date   HGBA1C 5.3 05/02/2013   Lab Results  Component Value Date   CHOL 188 07/10/2019   HDL 75 07/10/2019   LDLCALC 94 07/10/2019   TRIG 96 07/10/2019   CHOLHDL 2.5 07/10/2019    Significant Diagnostic Results in last 30 days:  No results found.  Assessment/Plan PNA (pneumonia) SOB, on 5 day course Prednisone since 05/29/24  CXR 05/30/24 bilateral opacities, pulmonary edema vs atelectasis vs PNA  05/30/24 placed on Levaquin  500mg  x 7  days, adding Furosemide  20mg  every day x 3 days, BMP today.   Edema SOB, on 5 day course Prednisone since 05/29/24  CXR 05/30/24 bilateral opacities, pulmonary edema vs atelectasis vs PNA  05/30/24 placed on Levaquin  500mg  x 7 days, adding Furosemide  20mg  every day x 3 days, BMP today.   Chronic venous insufficiency/Edema BLE, BNP wnl in the past, increased Torsemide  to 40mg  every day 05/29/24 in setting of SOB  ILD (interstitial lung disease) (HCC) SOB, on 5 day course Prednisone since 05/29/24  CXR 05/30/24 bilateral opacities, pulmonary edema vs atelectasis vs PNA  05/30/24 placed on Levaquin  500mg  x 7 days, adding Furosemide  20mg  every day x 3 days, BMP today.   Hx of mild ILD/bronchiectasis CT scan, on Trelegy, DuoNeb  Essential hypertension on Metoprolol , Bun/creat 26/0.7 01/18/24  Chronic cystitis on Keflex  250mg  every day  TIA (transient ischemic attack) Hx of TIA, on Plavix      Family/ staff Communication: plan of care reviewed with the patient and charge nurse.   Labs/tests ordered:  pending BMP done 06/02/24       [1]  Allergies Allergen Reactions   Trimethoprim Other (See Comments)    Headaches/ ear pressure    Erythromycin Other (See Comments)    UNSPECIFIED REACTION    Macrodantin [Nitrofurantoin Macrocrystal] Other (See Comments)    UNSPECIFIED REACTION    Other     Honeydew Melon   Penicillins Other (See Comments)    UNSPECIFIED REACTION  Has patient had a PCN reaction causing immediate rash, facial/tongue/throat swelling, SOB or lightheadedness with hypotension: Unknown Has patient had a PCN reaction causing severe rash involving mucus membranes or skin necrosis: Unknown Has patient had a PCN reaction that required hospitalization: No Has patient had a PCN reaction occurring within the last 10 years: No If all of the above answers are NO, then may proceed with Cephalosporin use.   Sulfa Antibiotics Other (See Comments)    UNSPECIFIED REACTION    Latex  Itching   "

## 2024-06-11 ENCOUNTER — Encounter (HOSPITAL_BASED_OUTPATIENT_CLINIC_OR_DEPARTMENT_OTHER): Admitting: General Surgery

## 2024-06-16 ENCOUNTER — Ambulatory Visit: Admitting: Orthopedic Surgery

## 2024-06-25 ENCOUNTER — Encounter (HOSPITAL_BASED_OUTPATIENT_CLINIC_OR_DEPARTMENT_OTHER): Admitting: General Surgery

## 2024-07-09 ENCOUNTER — Encounter (HOSPITAL_BASED_OUTPATIENT_CLINIC_OR_DEPARTMENT_OTHER): Admitting: General Surgery

## 2024-09-05 ENCOUNTER — Ambulatory Visit (INDEPENDENT_AMBULATORY_CARE_PROVIDER_SITE_OTHER): Admitting: Otolaryngology
